# Patient Record
Sex: Male | Born: 1965
Health system: Southern US, Community
[De-identification: ages and names within clinical notes are randomized; demographics above are authoritative.]

## PROBLEM LIST (undated history)

## (undated) DIAGNOSIS — C801 Malignant (primary) neoplasm, unspecified: Secondary | ICD-10-CM

## (undated) DIAGNOSIS — D126 Benign neoplasm of colon, unspecified: Secondary | ICD-10-CM

## (undated) DIAGNOSIS — D649 Anemia, unspecified: Secondary | ICD-10-CM

## (undated) DIAGNOSIS — R7989 Other specified abnormal findings of blood chemistry: Secondary | ICD-10-CM

## (undated) DIAGNOSIS — K824 Cholesterolosis of gallbladder: Secondary | ICD-10-CM

## (undated) DIAGNOSIS — J189 Pneumonia, unspecified organism: Secondary | ICD-10-CM

## (undated) DIAGNOSIS — C9000 Multiple myeloma not having achieved remission: Secondary | ICD-10-CM

## (undated) HISTORY — PX: OTHER SURGICAL HISTORY: SHX169

## (undated) HISTORY — DX: Anemia, unspecified: D64.9

## (undated) HISTORY — DX: Cholesterolosis of gallbladder: K82.4

## (undated) HISTORY — DX: Other specified abnormal findings of blood chemistry: R79.89

## (undated) HISTORY — DX: Benign neoplasm of colon, unspecified: D12.6

## (undated) HISTORY — DX: Pneumonia, unspecified organism: J18.9

---

## 2009-03-02 ENCOUNTER — Ambulatory Visit: Payer: Self-pay | Admitting: Diagnostic Radiology

## 2009-03-02 ENCOUNTER — Emergency Department (HOSPITAL_BASED_OUTPATIENT_CLINIC_OR_DEPARTMENT_OTHER): Admission: EM | Admit: 2009-03-02 | Discharge: 2009-03-02 | Payer: Self-pay | Admitting: Emergency Medicine

## 2012-02-16 ENCOUNTER — Encounter (HOSPITAL_BASED_OUTPATIENT_CLINIC_OR_DEPARTMENT_OTHER): Payer: Self-pay | Admitting: Emergency Medicine

## 2012-02-16 ENCOUNTER — Emergency Department (HOSPITAL_BASED_OUTPATIENT_CLINIC_OR_DEPARTMENT_OTHER)
Admission: EM | Admit: 2012-02-16 | Discharge: 2012-02-16 | Disposition: A | Discharge status: Home / Self Care | Payer: BC Managed Care – PPO | Attending: Emergency Medicine | Admitting: Emergency Medicine

## 2012-02-16 ENCOUNTER — Emergency Department (HOSPITAL_BASED_OUTPATIENT_CLINIC_OR_DEPARTMENT_OTHER): Payer: BC Managed Care – PPO

## 2012-02-16 DIAGNOSIS — Y9301 Activity, walking, marching and hiking: Secondary | ICD-10-CM | POA: Insufficient documentation

## 2012-02-16 DIAGNOSIS — X500XXA Overexertion from strenuous movement or load, initial encounter: Secondary | ICD-10-CM | POA: Insufficient documentation

## 2012-02-16 DIAGNOSIS — S93401A Sprain of unspecified ligament of right ankle, initial encounter: Secondary | ICD-10-CM

## 2012-02-16 DIAGNOSIS — S93409A Sprain of unspecified ligament of unspecified ankle, initial encounter: Secondary | ICD-10-CM | POA: Insufficient documentation

## 2012-02-16 DIAGNOSIS — Y9289 Other specified places as the place of occurrence of the external cause: Secondary | ICD-10-CM | POA: Insufficient documentation

## 2012-02-16 NOTE — ED Notes (Signed)
MD at bedside. 

## 2012-02-16 NOTE — ED Notes (Addendum)
Pt c/o Rt ankle pain. Pt states he rolled it when loading the car last night. Pt ambulatory w/ crutches.

## 2012-02-16 NOTE — ED Notes (Signed)
Chart reviewed.

## 2012-02-16 NOTE — ED Provider Notes (Signed)
History     CSN: [id number]  Arrival date & time 02/16/12  9528   First MD Initiated Contact with Patient 02/16/12 [phone]      Chief Complaint  Patient presents with  . Ankle Pain    Rt    (Consider location/radiation/quality/duration/timing/severity/associated sxs/prior treatment) Patient is a 46 y.o. male presenting with ankle pain. The history is provided by the patient.  Ankle Pain  The incident occurred yesterday. Incident location: walking through the parking lot. Injury mechanism: inversion injury. The pain is present in the right ankle. The quality of the pain is described as sharp. The pain is moderate. The pain has been constant since onset. Pertinent negatives include no numbness and no loss of motion. He reports no foreign bodies present. The symptoms are aggravated by bearing weight.    History reviewed. No pertinent past medical history.  History reviewed. No pertinent past surgical history.  No family history on file.  History  Substance Use Topics  . Smoking status: Never Smoker   . Smokeless tobacco: Not on file  . Alcohol Use: Yes      Review of Systems  Neurological: Negative for numbness.  All other systems reviewed and are negative.    Allergies  Review of patient's allergies indicates no known allergies.  Home Medications  No current outpatient prescriptions on file.  BP 154/101  Pulse 60  Temp 98.2 F (36.8 C) (Oral)  Resp 16  Ht 6\' 1"  (1.854 m)  Wt 210 lb (95.255 kg)  BMI 27.71 kg/m2  SpO2 99%  Physical Exam  Nursing note and vitals reviewed. Constitutional: He is oriented to person, place, and time. He appears well-developed and well-nourished. No distress.  HENT:  Head: Normocephalic and atraumatic.  Mouth/Throat: Oropharynx is clear and moist.  Neck: Normal range of motion. Neck supple.  Musculoskeletal:       The right ankle is noted to have swelling, ttp over and inferior to the lateral malleolus.  There is no prox fib, med  malleolar, or 5th mt ttp.    Neurological: He is alert and oriented to person, place, and time.  Skin: Skin is warm and dry. He is not diaphoretic.    ED Course  Procedures (including critical care time)  Labs Reviewed - No data to display No results found.   No diagnosis found.    MDM  The xrays are negative except for a possibl avulsion fracture that I suspect is old.  Will treat as a sprain with rice, follow up prn.          Geoffery Lyons, MD 02/16/12 1007

## 2014-07-03 ENCOUNTER — Encounter: Payer: Self-pay | Admitting: Hematology & Oncology

## 2014-07-03 ENCOUNTER — Telehealth: Payer: Self-pay | Admitting: Hematology & Oncology

## 2014-07-03 NOTE — Telephone Encounter (Signed)
Called and spoke with patient's wife Manuela Schwartz).  Notified her that the first thing we had was not until March 31st.  Informed her of our satellite office with Dr. [name] in [facility]. Patient's wife stated they are willing to travel to [facility].    Called Rick at [facility] and he will call me back with an appt ASAP.    Dx:  R/O acute leukemia Referring:  Dr. Maceo Pro

## 2014-07-03 NOTE — Telephone Encounter (Signed)
Rick called from Dr. Antonieta Pert office.  Can see patient on Friday, March 18th @ 12pm.    Called pt's wife Manuela Schwartz) and notified her of appt, address to Dr. Antonieta Pert office, and gave her phone number in case they needed further directions.

## 2014-07-04 ENCOUNTER — Telehealth: Payer: Self-pay | Admitting: Hematology & Oncology

## 2014-07-04 NOTE — Telephone Encounter (Signed)
I tried to call  NEW PATIENT today to remind them of their appointment with Dr. [name]. Also, advised them to bring all medication bottles and insurance card information. However, the ph listed is disc.

## 2014-07-05 ENCOUNTER — Ambulatory Visit (HOSPITAL_BASED_OUTPATIENT_CLINIC_OR_DEPARTMENT_OTHER): Payer: BLUE CROSS/BLUE SHIELD | Admitting: Hematology & Oncology

## 2014-07-05 ENCOUNTER — Ambulatory Visit: Payer: BLUE CROSS/BLUE SHIELD

## 2014-07-05 ENCOUNTER — Encounter: Payer: Self-pay | Admitting: Hematology & Oncology

## 2014-07-05 ENCOUNTER — Ambulatory Visit (HOSPITAL_BASED_OUTPATIENT_CLINIC_OR_DEPARTMENT_OTHER): Payer: BLUE CROSS/BLUE SHIELD | Admitting: Lab

## 2014-07-05 ENCOUNTER — Ambulatory Visit (HOSPITAL_BASED_OUTPATIENT_CLINIC_OR_DEPARTMENT_OTHER)
Admission: RE | Admit: 2014-07-05 | Discharge: 2014-07-05 | Disposition: A | Discharge status: Home / Self Care | Payer: BLUE CROSS/BLUE SHIELD | Source: Ambulatory Visit | Attending: Hematology & Oncology | Admitting: Hematology & Oncology

## 2014-07-05 VITALS — BP 129/81 | HR 69 | Temp 98.6°F | Resp 18 | Ht 73.0 in | Wt 200.0 lb

## 2014-07-05 DIAGNOSIS — C9000 Multiple myeloma not having achieved remission: Secondary | ICD-10-CM

## 2014-07-05 LAB — CBC WITH DIFFERENTIAL (CANCER CENTER ONLY)
BASO#: 0 10*3/uL (ref 0.0–0.2)
BASO%: 0.3 % (ref 0.0–2.0)
EOS%: 1 % (ref 0.0–7.0)
Eosinophils Absolute: 0 10*3/uL (ref 0.0–0.5)
HCT: 23.9 % — ABNORMAL LOW (ref 38.7–49.9)
HGB: 8.1 g/dL — ABNORMAL LOW (ref 13.0–17.1)
LYMPH#: 1.4 10*3/uL (ref 0.9–3.3)
LYMPH%: 46.4 % (ref 14.0–48.0)
MCH: 33.8 pg — ABNORMAL HIGH (ref 28.0–33.4)
MCHC: 33.9 g/dL (ref 32.0–35.9)
MCV: 100 fL — ABNORMAL HIGH (ref 82–98)
MONO#: 0.4 10*3/uL (ref 0.1–0.9)
MONO%: 12.2 % (ref 0.0–13.0)
NEUT#: 1.2 10*3/uL — ABNORMAL LOW (ref 1.5–6.5)
NEUT%: 40.1 % (ref 40.0–80.0)
Platelets: 77 10*3/uL — ABNORMAL LOW (ref 145–400)
RBC: 2.4 10*6/uL — ABNORMAL LOW (ref 4.20–5.70)
RDW: 16.8 % — ABNORMAL HIGH (ref 11.1–15.7)
WBC: 3 10*3/uL — ABNORMAL LOW (ref 4.0–10.0)

## 2014-07-05 LAB — COMPREHENSIVE METABOLIC PANEL
ALT: 41 U/L (ref 0–53)
AST: 27 U/L (ref 0–37)
Albumin: 3.4 g/dL — ABNORMAL LOW (ref 3.5–5.2)
Alkaline Phosphatase: 39 U/L (ref 39–117)
BUN: 20 mg/dL (ref 6–23)
CO2: 23 mEq/L (ref 19–32)
Calcium: 9.5 mg/dL (ref 8.4–10.5)
Chloride: 101 mEq/L (ref 96–112)
Creatinine, Ser: 1.57 mg/dL — ABNORMAL HIGH (ref 0.50–1.35)
Glucose, Bld: 86 mg/dL (ref 70–99)
Potassium: 4.2 mEq/L (ref 3.5–5.3)
Sodium: 130 mEq/L — ABNORMAL LOW (ref 135–145)
Total Bilirubin: 0.6 mg/dL (ref 0.2–1.2)
Total Protein: 12.1 g/dL — ABNORMAL HIGH (ref 6.0–8.3)

## 2014-07-05 LAB — TECHNOLOGIST REVIEW CHCC SATELLITE

## 2014-07-05 LAB — CHCC SATELLITE - SMEAR

## 2014-07-05 NOTE — Progress Notes (Signed)
Referral MD  Reason for Referral: IgG Kappa myeloma   Chief Complaint  Patient presents with  . NEW PATIENT  : I came back from [place] because of my blood counts.  HPI: [name] is a really nice 49 year old white gentleman. He and his wife were living in [place]. He works for [employer]. He was planned to be there for a couple years.  He is incredibly fit. He does a lot of exercising. He does very physical exercising.  He found that he was just getting weaker. He just did not have a lot of energy.  He was not hurting. He had some achiness.  He had no fever. He had no cough. He had no leg swelling. There were no rashes.  He also only went to see a doctor in [place]. The doctor, he was incredibly anemic. I'm not sure what his hemoglobin was but I think that from the labs that were sent over from [place], his hemoglobin was 5.  He has had 3 or 4 units of blood.  He was found to have a protein spike. Again, I think that with the conversion that we have, I think his monoclonal spike was 4.9 g/dL.  Because of the socialized medicine in [place], he would not be able to get in to see a doctor for a while. He came back to the [place]. He actually lives close by. We were able to get him in today.  He really looks good. He does get tired easily.  Overall, his performance status is ECOG 0.  He does not smoke. He really does not drink much. His appetite has been okay. Not a vegetarian.  There is no type of blood problems in the family.  Of note, he had Lyme disease probably 17 years ago. He was treated with antibiotics for this.   No past medical history on file.:  No past surgical history on file.:  No current outpatient prescriptions on file.:  :  No Known Allergies:  No family history on file.:  History   Social History  . Marital Status: Married    Spouse Name: N/A  . Number of Children: N/A  . Years of Education: N/A   Occupational History  . Not on file.    Social History Main Topics  . Smoking status: Never Smoker   . Smokeless tobacco: Never Used     Comment: NEVER USED TOBACCO  . Alcohol Use: 0.0 oz/week    0 Standard drinks or equivalent per week  . Drug Use: No  . Sexual Activity: Not on file   Other Topics Concern  . Not on file   Social History Narrative  :  Pertinent items are noted in HPI.  Exam: '@IPVITALS' @  well-developed and well-nourished white gentleman in no obvious distress. His vital signs show a temperature of 98.6. Pulse 69. Blood pressure 129/81. Weight is 200 pounds. Head and neck exam shows normocephalic atraumatic skull. There are no ocular or oral lesions. He has no palpable cervical or supraclavicular lymph nodes. Lungs are clear. There are no rales, wheezes or rhonchi. Cardiac exam regular rate and rhythm with no murmurs, rubs or bruits. Abdomen is soft. He has good bowel sounds. There is no fluid wave. There is no palpable abdominal mass. He has no palpable hepatomegaly. Spleen tip might be palpable with deep inspiration. Back exam shows no tenderness over the spine, ribs or hips. Extremities shows no clubbing, cyanosis or edema. Neurological exam shows no focal neurological deficits.  Recent Labs  07/05/14 1211  WBC 3.0*  HGB 8.1*  HCT 23.9*  PLT 77*   No results for input(s): NA, K, CL, CO2, GLUCOSE, BUN, CREATININE, CALCIUM in the last 72 hours.  Blood smear review: Normochromic and normocytic population of red blood cells. He has marked rouleau formation. There is no nucleated red cells. He has no teardrop cells. White cells show a rare plasma cell. He has good maturation of his myeloid cells. I see no atypical lymphocytes. Platelets are decreased in number. Plantars are small. Platelets are well granulated.  Pathology: None     Assessment and Plan: [name] is a nice 49 year old gentleman. I have to believe that he has myeloma. I must say that it is very unusual to see such marked rouleau  formation on her blood smear. He clearly had this. In addition, there were some plasma cells that I saw.  He did have a bone survey done today. Surprisingly enough, this did not show any bony involvement.  1 possibility that we might have to think about with him is plasma cell leukemia. It is unusual for myeloma to cause pancytopenia. However, plasma cell leukemia would be more likely.  Ultimately, he will need a bone marrow biopsy. I will set this up for March 22. We will send off flow cytometry and cytogenetics  I spent about an hour with he and his wife. I planed to him what I thought was going on. He understands this.  He will need to do a 24-hour urine for [place]. I also will set him up with a PET scan.  Once we have all the results back from our bone marrow biopsy, cytogenetics and blood/urine studies, we will get him back in and plan for therapy.  I suspect that he will need ultimately a stem cell transplant I talked to him and his wife about this.

## 2014-07-08 ENCOUNTER — Other Ambulatory Visit: Payer: BLUE CROSS/BLUE SHIELD | Admitting: Lab

## 2014-07-08 ENCOUNTER — Other Ambulatory Visit: Payer: Self-pay | Admitting: Hematology & Oncology

## 2014-07-08 DIAGNOSIS — C9000 Multiple myeloma not having achieved remission: Secondary | ICD-10-CM

## 2014-07-09 ENCOUNTER — Telehealth: Payer: Self-pay | Admitting: Hematology & Oncology

## 2014-07-09 ENCOUNTER — Ambulatory Visit (HOSPITAL_COMMUNITY)
Admission: RE | Admit: 2014-07-09 | Discharge: 2014-07-09 | Disposition: A | Discharge status: Home / Self Care | Payer: BLUE CROSS/BLUE SHIELD | Source: Ambulatory Visit | Attending: Hematology & Oncology | Admitting: Hematology & Oncology

## 2014-07-09 ENCOUNTER — Encounter (HOSPITAL_COMMUNITY): Payer: Self-pay

## 2014-07-09 VITALS — BP 119/69 | HR 70 | Temp 97.6°F | Resp 19 | Ht 73.0 in | Wt 200.0 lb

## 2014-07-09 DIAGNOSIS — C9000 Multiple myeloma not having achieved remission: Secondary | ICD-10-CM | POA: Diagnosis not present

## 2014-07-09 DIAGNOSIS — D61818 Other pancytopenia: Secondary | ICD-10-CM | POA: Insufficient documentation

## 2014-07-09 LAB — CBC WITH DIFFERENTIAL/PLATELET
Basophils Absolute: 0 10*3/uL (ref 0.0–0.1)
Basophils Relative: 1 % (ref 0–1)
Eosinophils Absolute: 0 10*3/uL (ref 0.0–0.7)
Eosinophils Relative: 1 % (ref 0–5)
HCT: 22.5 % — ABNORMAL LOW (ref 39.0–52.0)
Hemoglobin: 7.5 g/dL — ABNORMAL LOW (ref 13.0–17.0)
Lymphocytes Relative: 45 % (ref 12–46)
Lymphs Abs: 1.5 10*3/uL (ref 0.7–4.0)
MCH: 33.5 pg (ref 26.0–34.0)
MCHC: 33.3 g/dL (ref 30.0–36.0)
MCV: 100.4 fL — ABNORMAL HIGH (ref 78.0–100.0)
Monocytes Absolute: 0.3 10*3/uL (ref 0.1–1.0)
Monocytes Relative: 9 % (ref 3–12)
Neutro Abs: 1.5 10*3/uL — ABNORMAL LOW (ref 1.7–7.7)
Neutrophils Relative %: 45 % (ref 43–77)
Platelets: 74 10*3/uL — ABNORMAL LOW (ref 150–400)
RBC: 2.24 MIL/uL — ABNORMAL LOW (ref 4.22–5.81)
RDW: 17 % — ABNORMAL HIGH (ref 11.5–15.5)
WBC: 3.3 10*3/uL — ABNORMAL LOW (ref 4.0–10.5)

## 2014-07-09 LAB — BONE MARROW EXAM

## 2014-07-09 MED ORDER — SODIUM CHLORIDE 0.9 % IV SOLN
INTRAVENOUS | Status: DC
  Administered 2014-07-09: 200 mL via INTRAVENOUS

## 2014-07-09 MED ORDER — MEPERIDINE HCL 50 MG/ML IJ SOLN
50.0000 mg | Freq: Once | INTRAMUSCULAR | Status: AC
  Administered 2014-07-09: 50 mg via INTRAVENOUS
  Filled 2014-07-09: qty 1

## 2014-07-09 MED ORDER — MIDAZOLAM HCL 10 MG/2ML IJ SOLN
10.0000 mg | Freq: Once | INTRAMUSCULAR | Status: AC
  Administered 2014-07-09: 5 mg via INTRAVENOUS
  Filled 2014-07-09: qty 2

## 2014-07-09 NOTE — Sedation Documentation (Signed)
Family updated as to patient's status.

## 2014-07-09 NOTE — Sedation Documentation (Signed)
MD at bedside. 

## 2014-07-09 NOTE — Procedures (Signed)
This is a bone marrow biopsy aspirate no 4 [name].  He is brought to the short stay unit at [facility]. He had IV placed peripherally.  We did the appropriate timeout procedure at 8:00 in the morning.  His Mallimpati score was 1. His ASA class was 1.  We then placed onto his right side. He received a total of 5 mg of Versed and 50 mg of Demerol for IV sedation.  The left posterior iliac crest region was prepped and draped in sterile fashion. 5 mL of 2% lidocaine was admitted under the skin down to the periosteum. A #11 scalpel was used to make an incision into the skin.  Despite 3 attempts, we cannot get an aspirate.  I then used the biopsy needle. I obtained to biopsy cores. One core was sent off for flow cytometry and sound genetics.  He tolerated the procedure well. There were no complications. I dressed the site sterilely.  I talked to his wife afterwards. I told him that I would call them with the results on Thursday.  Lum Keas

## 2014-07-09 NOTE — Discharge Instructions (Signed)
Bone Marrow Aspiration and Bone Biopsy Examination of the bone marrow is a valuable test to diagnose blood disorders. A bone marrow biopsy takes a sample of bone and a small amount of fluid and cells from inside the bone. A bone marrow aspiration removes only the marrow. Bone marrow aspiration and bone biopsies are used to stage different disorders of the blood, such as leukemia. Staging will help your caregiver understand how far the disease has progressed.  The tests are also useful in diagnosing:  Fever of unknown origin (FUO).  Bacterial infections and other widespread fungal infections.  Cancers that have spread (metastasized) to the bone marrow.  Diseases that are characterized by a deficiency of an enzyme (storage diseases). This includes:  Niemann-Pick disease.  Gaucher disease. PROCEDURE  Sites used to get samples include:   Back of your hip bone (posterior iliac crest).  Both aspiration and biopsy.  Front of your hip bone (anterior iliac crest).  Both aspiration and biopsy.  Breastbone (sternum).  Aspiration from your breastbone (done only in adults). This method is rarely used. When you get a hip bone aspiration:  You are placed lying on your side with the upper knee brought up and flexed with the lower leg straight.  The site is prepared, cleaned with an antiseptic scrub, and draped. This keeps the biopsy area clean.  The skin and the area down to the lining of the bone (periosteum) are made numb with a local anesthetic.  The bone marrow aspiration needle is inserted. You will feel pressure on your bone.  Once inside the marrow cavity, a sample of bone marrow is sucked out (aspirated) for pathology slides.  The material collected for bone marrow slides is processed immediately by a technologist.  The technician selects the marrow particles to make the slides for pathology.  The marrow aspiration needle is removed. Then pressure is applied to the site with  gauze until bleeding has stopped. Following an aspiration, a bone marrow biopsy may be performed as well. The technique for this is very similar. A dressing is then applied.  RISKS AND COMPLICATIONS  The main complications of a bone marrow aspiration and biopsy include infection and bleeding.  Complications are uncommon. The procedure may not be performed in patients with bleeding tendencies.  A very rare complication from the procedure is injury to the heart during a breastbone (sternal) marrow aspiration. Only bone marrow aspirations are performed in this area.  Long-lasting pain at the site of the bone marrow aspiration and biopsy is uncommon. Your caregiver will let you know when you are to get your results and will discuss them with you. You may make an appointment with your caregiver to find out the results. Do not assume everything is normal if you have not heard from your caregiver or the medical facility. It is important for you to follow up on all of your test results. Document Released: 04/08/2004 Document Revised: 06/28/2011 Document Reviewed: 04/02/2008 [facility] Patient Information 2015 [place], [place]. This information is not intended to replace advice given to you by your health care provider. Make sure you discuss any questions you have with your health care provider. Do not drive  For 24 hours Do not go into public places today May resume your regular diet and take home medications as usual May experience small amount of tingling in leg (biopsy side) May take shower and remove bandage in am For any questions or concerns, call dr If bleeding occurs at site, hold pressure x10  minutes  If continues, call doctor

## 2014-07-09 NOTE — Sedation Documentation (Signed)
Patient denies pain and is resting comfortably.  

## 2014-07-09 NOTE — Sedation Documentation (Signed)
dsg cdi 

## 2014-07-09 NOTE — Telephone Encounter (Signed)
PET is precerted called back to schedule for this week Jan said I needed to call their director Elta Guadeloupe [phone] to get PET this week. I talked with Elta Guadeloupe and he took down all the information and will call me back.

## 2014-07-09 NOTE — Sedation Documentation (Signed)
Patient is resting comfortably. 

## 2014-07-09 NOTE — Sedation Documentation (Signed)
Patient identified as Tyler Phillips by  Dr Martha Clan Santiago Glad Vin Yonke

## 2014-07-09 NOTE — Sedation Documentation (Signed)
Medication dose calculated and verified for: Latavious Hammad demerol 50mg  / versed 5mg 

## 2014-07-09 NOTE — Telephone Encounter (Signed)
Tyler Phillips called back scheduled 3-23 PET at 11:30 pt is aware and to be NPO 6 hrs.

## 2014-07-09 NOTE — Sedation Documentation (Signed)
dsg applied per md/ CDI

## 2014-07-10 ENCOUNTER — Ambulatory Visit (HOSPITAL_COMMUNITY)
Admission: RE | Admit: 2014-07-10 | Discharge: 2014-07-10 | Disposition: A | Discharge status: Home / Self Care | Payer: BLUE CROSS/BLUE SHIELD | Source: Ambulatory Visit | Attending: Hematology & Oncology | Admitting: Hematology & Oncology

## 2014-07-10 ENCOUNTER — Other Ambulatory Visit: Payer: Self-pay | Admitting: Hematology & Oncology

## 2014-07-10 DIAGNOSIS — C9000 Multiple myeloma not having achieved remission: Secondary | ICD-10-CM

## 2014-07-10 LAB — IGG, IGA, IGM
IgA: 26 mg/dL — ABNORMAL LOW (ref 68–379)
IgG (Immunoglobin G), Serum: 6860 mg/dL — ABNORMAL HIGH (ref 650–1600)
IgM, Serum: 23 mg/dL — ABNORMAL LOW (ref 41–251)

## 2014-07-10 LAB — PROTEIN ELECTROPHORESIS, SERUM, WITH REFLEX
Albumin ELP: 4.8 g/dL (ref 3.8–4.8)
Alpha-1-Globulin: 0.3 g/dL (ref 0.2–0.3)
Alpha-2-Globulin: 0.7 g/dL (ref 0.5–0.9)
Beta 2: 0.2 g/dL (ref 0.2–0.5)
Beta Globulin: 0.4 g/dL (ref 0.4–0.6)
Gamma Globulin: 5.8 g/dL — ABNORMAL HIGH (ref 0.8–1.7)
M-Spike, %: 5.3 g/dL
Total Protein, Serum Electrophoresis: 12.2 g/dL — ABNORMAL HIGH (ref 6.1–8.1)

## 2014-07-10 LAB — KAPPA/LAMBDA LIGHT CHAINS
Kappa free light chain: 79.1 mg/dL — ABNORMAL HIGH (ref 0.33–1.94)
Kappa:Lambda Ratio: 494.38 — ABNORMAL HIGH (ref 0.26–1.65)
Lambda Free Lght Chn: 0.16 mg/dL — ABNORMAL LOW (ref 0.57–2.63)

## 2014-07-10 LAB — LACTATE DEHYDROGENASE: LDH: 132 U/L (ref 94–250)

## 2014-07-10 LAB — BETA 2 MICROGLOBULIN, SERUM: Beta-2 Microglobulin: 18.9 mg/L — ABNORMAL HIGH (ref <=2.51)

## 2014-07-10 LAB — IFE INTERPRETATION

## 2014-07-10 LAB — GLUCOSE, CAPILLARY: Glucose-Capillary: 94 mg/dL (ref 70–99)

## 2014-07-10 MED ORDER — FLUDEOXYGLUCOSE F - 18 (FDG) INJECTION
10.0600 | Freq: Once | INTRAVENOUS | Status: AC | PRN
  Administered 2014-07-10: 10.06 via INTRAVENOUS

## 2014-07-11 NOTE — Addendum Note (Signed)
Addended by: Burney Gauze R on: 07/11/2014 05:10 PM   Modules accepted: Orders

## 2014-07-12 ENCOUNTER — Encounter: Payer: Self-pay | Admitting: *Deleted

## 2014-07-12 ENCOUNTER — Telehealth: Payer: Self-pay | Admitting: Hematology & Oncology

## 2014-07-12 LAB — UIFE/LIGHT CHAINS/TP QN, 24-HR UR
Albumin, U: DETECTED
Alpha 1, Urine: DETECTED — AB
Alpha 2, Urine: DETECTED — AB
Beta, Urine: DETECTED — AB
Gamma Globulin, Urine: DETECTED — AB
Time: 24 hours
Total Protein, Urine-Ur/day: 198 mg/d — ABNORMAL HIGH (ref <150)
Total Protein, Urine: 6 mg/dL (ref 5–25)
Volume, Urine: 3300 mL

## 2014-07-12 LAB — 24 HR URINE,KAPPA/LAMBDA LIGHT CHAINS
24H Urine Volume: 3300 mL/24 h
Measured Kappa Chain: 1.58 mg/dL (ref <2.00)
Measured Lambda Chain: <0.4 mg/dL (ref <2.00)
Total Kappa Chain: 52.14 mg/24 h

## 2014-07-12 NOTE — Telephone Encounter (Signed)
Left pt message with 3-28 appointment

## 2014-07-15 ENCOUNTER — Other Ambulatory Visit: Payer: BLUE CROSS/BLUE SHIELD

## 2014-07-15 ENCOUNTER — Telehealth: Payer: Self-pay | Admitting: Hematology & Oncology

## 2014-07-15 ENCOUNTER — Ambulatory Visit (HOSPITAL_COMMUNITY)
Admission: RE | Admit: 2014-07-15 | Discharge: 2014-07-15 | Disposition: A | Discharge status: Home / Self Care | Payer: BLUE CROSS/BLUE SHIELD | Source: Ambulatory Visit | Attending: Hematology & Oncology | Admitting: Hematology & Oncology

## 2014-07-15 ENCOUNTER — Encounter: Payer: Self-pay | Admitting: Hematology & Oncology

## 2014-07-15 ENCOUNTER — Ambulatory Visit (HOSPITAL_BASED_OUTPATIENT_CLINIC_OR_DEPARTMENT_OTHER): Payer: BLUE CROSS/BLUE SHIELD | Admitting: Hematology & Oncology

## 2014-07-15 ENCOUNTER — Ambulatory Visit: Payer: BLUE CROSS/BLUE SHIELD | Admitting: Hematology & Oncology

## 2014-07-15 VITALS — BP 142/90 | HR 85 | Temp 98.3°F | Resp 18 | Ht 72.0 in | Wt 198.0 lb

## 2014-07-15 DIAGNOSIS — C9000 Multiple myeloma not having achieved remission: Secondary | ICD-10-CM

## 2014-07-15 DIAGNOSIS — C9001 Multiple myeloma in remission: Secondary | ICD-10-CM | POA: Insufficient documentation

## 2014-07-15 LAB — CBC WITH DIFFERENTIAL (CANCER CENTER ONLY)
BASO#: 0 10*3/uL (ref 0.0–0.2)
BASO%: 0.3 % (ref 0.0–2.0)
EOS%: 2.1 % (ref 0.0–7.0)
Eosinophils Absolute: 0.1 10*3/uL (ref 0.0–0.5)
HCT: 20.9 % — ABNORMAL LOW (ref 38.7–49.9)
HGB: 6.9 g/dL — CL (ref 13.0–17.1)
LYMPH#: 1.5 10*3/uL (ref 0.9–3.3)
LYMPH%: 44.4 % (ref 14.0–48.0)
MCH: 34 pg — ABNORMAL HIGH (ref 28.0–33.4)
MCHC: 33 g/dL (ref 32.0–35.9)
MCV: 103 fL — ABNORMAL HIGH (ref 82–98)
MONO#: 0.4 10*3/uL (ref 0.1–0.9)
MONO%: 10.8 % (ref 0.0–13.0)
NEUT#: 1.4 10*3/uL — ABNORMAL LOW (ref 1.5–6.5)
NEUT%: 42.4 % (ref 40.0–80.0)
Platelets: 78 10*3/uL — ABNORMAL LOW (ref 145–400)
RBC: 2.03 10*6/uL — ABNORMAL LOW (ref 4.20–5.70)
RDW: 16.3 % — ABNORMAL HIGH (ref 11.1–15.7)
WBC: 3.3 10*3/uL — ABNORMAL LOW (ref 4.0–10.0)

## 2014-07-15 LAB — CHROMOSOME ANALYSIS, BONE MARROW

## 2014-07-15 LAB — HOLD TUBE, BLOOD BANK - CHCC SATELLITE

## 2014-07-15 LAB — TISSUE HYBRIDIZATION (BONE MARROW)-NCBH

## 2014-07-15 LAB — TECHNOLOGIST REVIEW CHCC SATELLITE

## 2014-07-15 LAB — ABO/RH: ABO/RH(D): O POS

## 2014-07-15 MED ORDER — DEXAMETHASONE 4 MG PO TABS: ORAL_TABLET | ORAL | Status: DC

## 2014-07-15 MED ORDER — LENALIDOMIDE 25 MG PO CAPS: 25.0000 mg | ORAL_CAPSULE | Freq: Every day | ORAL | Status: DC

## 2014-07-15 MED ORDER — FAMCICLOVIR 500 MG PO TABS: 500.0000 mg | ORAL_TABLET | Freq: Every day | ORAL | Status: DC

## 2014-07-15 NOTE — Patient Instructions (Signed)
Bortezomib injection What is this medicine? BORTEZOMIB (bor TEZ oh mib) is a chemotherapy drug. It slows the growth of cancer cells. This medicine is used to treat multiple myeloma, and certain lymphomas, such as mantle-cell lymphoma. This medicine may be used for other purposes; ask your health care provider or pharmacist if you have questions. COMMON BRAND NAME(S): Velcade What should I tell my health care provider before I take this medicine? They need to know if you have any of these conditions: -diabetes -heart disease -irregular heartbeat -liver disease -on hemodialysis -low blood counts, like low white blood cells, platelets, or hemoglobin -peripheral neuropathy -taking medicine for blood pressure -an unusual or allergic reaction to bortezomib, mannitol, boron, other medicines, foods, dyes, or preservatives -pregnant or trying to get pregnant -breast-feeding How should I use this medicine? This medicine is for injection into a vein or for injection under the skin. It is given by a health care professional in a hospital or clinic setting. Talk to your pediatrician regarding the use of this medicine in children. Special care may be needed. Overdosage: If you think you have taken too much of this medicine contact a poison control center or emergency room at once. NOTE: This medicine is only for you. Do not share this medicine with others. What if I miss a dose? It is important not to miss your dose. Call your doctor or health care professional if you are unable to keep an appointment. What may interact with this medicine? This medicine may interact with the following medications: -ketoconazole -rifampin -ritonavir -St. John's Wort This list may not describe all possible interactions. Give your health care provider a list of all the medicines, herbs, non-prescription drugs, or dietary supplements you use. Also tell them if you smoke, drink alcohol, or use illegal drugs. Some items  may interact with your medicine. What should I watch for while using this medicine? Visit your doctor for checks on your progress. This drug may make you feel generally unwell. This is not uncommon, as chemotherapy can affect healthy cells as well as cancer cells. Report any side effects. Continue your course of treatment even though you feel ill unless your doctor tells you to stop. You may get drowsy or dizzy. Do not drive, use machinery, or do anything that needs mental alertness until you know how this medicine affects you. Do not stand or sit up quickly, especially if you are an older patient. This reduces the risk of dizzy or fainting spells. In some cases, you may be given additional medicines to help with side effects. Follow all directions for their use. Call your doctor or health care professional for advice if you get a fever, chills or sore throat, or other symptoms of a cold or flu. Do not treat yourself. This drug decreases your body's ability to fight infections. Try to avoid being around people who are sick. This medicine may increase your risk to bruise or bleed. Call your doctor or health care professional if you notice any unusual bleeding. You may need blood work done while you are taking this medicine. In some patients, this medicine may cause a serious brain infection that may cause death. If you have any problems seeing, thinking, speaking, walking, or standing, tell your doctor right away. If you cannot reach your doctor, urgently seek other source of medical care. Do not become pregnant while taking this medicine. Women should inform their doctor if they wish to become pregnant or think they might be pregnant. There is   you cannot reach your doctor, urgently seek other source of medical care.  Do not become pregnant while taking this medicine. Women should inform their doctor if they wish to become pregnant or think they might be pregnant. There is a potential for serious side effects to an unborn child. Talk to your health care professional or pharmacist for more information. Do not breast-feed an infant while taking this medicine.  Check with your doctor or health care professional if you get an attack of  severe diarrhea, nausea and vomiting, or if you sweat a lot. The loss of too much body fluid can make it dangerous for you to take this medicine.  What side effects may I notice from receiving this medicine?  Side effects that you should report to your doctor or health care professional as soon as possible:  -allergic reactions like skin rash, itching or hives, swelling of the face, lips, or tongue  -breathing problems  -changes in hearing  -changes in vision  -fast, irregular heartbeat  -feeling faint or lightheaded, falls  -pain, tingling, numbness in the hands or feet  -right upper belly pain  -seizures  -swelling of the ankles, feet, hands  -unusual bleeding or bruising  -unusually weak or tired  -vomiting  -yellowing of the eyes or skin  Side effects that usually do not require medical attention (report to your doctor or health care professional if they continue or are bothersome):  -changes in emotions or moods  -constipation  -diarrhea  -loss of appetite  -headache  -irritation at site where injected  -nausea  This list may not describe all possible side effects. Call your doctor for medical advice about side effects. You may report side effects to FDA at 1-800-FDA-1088.  Where should I keep my medicine?  This drug is given in a hospital or clinic and will not be stored at home.  NOTE: This sheet is a summary. It may not cover all possible information. If you have questions about this medicine, talk to your doctor, pharmacist, or health care provider.   2015, Elsevier/Gold Standard. (2013-01-29 12:46:32)  Lenalidomide Oral Capsules  What is this medicine?  LENALIDOMIDE (len a LID oh mide) is a chemotherapy drug that targets specific proteins within cancer cells and stops the cancer cell from growing. It is used to treat multiple myeloma, mantle cell lymphoma, and some myelodysplastic syndromes that cause severe anemia requiring blood transfusions.  This medicine may be used for other purposes; ask your  health care provider or pharmacist if you have questions.  COMMON BRAND NAME(S): Revlimid  What should I tell my health care provider before I take this medicine?  They need to know if you have any of these conditions:  -blood clots in the legs or the lungs  -high blood pressure  -high cholesterol  -infection  -irregular monthly periods or menstrual cycles  -kidney disease  -liver disease  -smoke tobacco  -thyroid disease  -an unusual or allergic reaction to lenalidomide, other medicines, foods, dyes, or preservatives  -pregnant or trying to get pregnant  -breast-feeding  How should I use this medicine?  Take this medicine by mouth with a glass of water. Follow the directions on the prescription label. Do not cut, crush, or chew this medicine. Take your medicine at regular intervals. Do not take it more often than directed. Do not stop taking except on your doctor's advice.  A MedGuide will be given with each prescription and refill. Read this guide carefully each time. The   MedGuide may change frequently.  Talk to your pediatrician regarding the use of this medicine in children. Special care may be needed.  Overdosage: If you think you have taken too much of this medicine contact a poison control center or emergency room at once.  NOTE: This medicine is only for you. Do not share this medicine with others.  What if I miss a dose?  If you miss a dose, take it as soon as you can. If your next dose is to be taken in less than 12 hours, then do not take the missed dose. Take the next dose at your regular time. Do not take double or extra doses.  What may interact with this medicine?  This medicine may interact with the following medications:  -digoxin  -medicines that increase the risk of thrombosis like estrogens or erythropoietic agents (e.g., epoetin alfa and darbepoetin alfa)  -warfarin  This list may not describe all possible interactions. Give your health care provider a list of all the medicines, herbs,  non-prescription drugs, or dietary supplements you use. Also tell them if you smoke, drink alcohol, or use illegal drugs. Some items may interact with your medicine.  What should I watch for while using this medicine?  Visit your doctor for regular check ups. Tell your doctor or healthcare professional if your symptoms do not start to get better or if they get worse. You will need to have important blood work done while you are taking this medicine.  This medicine is available only through a special program. Doctors, pharmacies, and patients must meet all of the conditions of the program. Your health care provider will help you get signed up with the program if you need this medicine. Through the program you will only receive up to a 28 day supply of the medicine at one time. You will need a new prescription for each refill.  This medicine can cause birth defects. Do not get pregnant while taking this drug. Females with child-bearing potential will need to have 2 negative pregnancy tests before starting this medicine. Pregnancy testing must be done every 2 to 4 weeks as directed while taking this medicine. Use 2 reliable forms of birth control together while you are taking this medicine and for 1 month after you stop taking this medicine. If you think that you might be pregnant talk to your doctor right away.  Men must use a latex condom during sexual contact with a woman while taking this medicine and for 28 days after you stop taking this medicine. A latex condom is needed even if you have had a vasectomy. Contact your doctor right away if your partner becomes pregnant. Do not donate sperm while taking this medicine and for 28 days after you stop taking this medicine.  Do not give blood while taking the medicine and for 1 month after completion of treatment to avoid exposing pregnant women to the medicine through the donated blood.  Talk to your doctor about your risk of cancer. You may be more at risk for certain  types of cancers if you take this medicine.  What side effects may I notice from receiving this medicine?  Side effects that you should report to your doctor or health care professional as soon as possible:  -allergic reactions like skin rash, itching or hives, swelling of the face, lips, or tongue  -breathing problems  -chest pain or tightness  -fast, irregular heartbeat  -low blood counts - this medicine may decrease the number   of white blood cells, red blood cells and platelets. You may be at increased risk for infections and bleeding.  -seizures  -signs and symptoms of bleeding such as bloody or black, tarry stools; red or dark-brown urine; spitting up blood or brown material that looks like coffee grounds; red spots on the skin; unusual bruising or bleeding from the eye, gums, or nose  -signs and symptoms of a blood clot such as breathing problems; changes in vision; chest pain; severe, sudden headache; pain, swelling, warmth in the leg; trouble speaking; sudden numbness or weakness of the face, arm or leg  -signs and symptoms of liver injury like dark yellow or brown urine; general ill feeling or flu-like symptoms; light-colored stools; loss of appetite; nausea; right upper belly pain; unusually weak or tired; yellowing of the eyes or skin  -signs and symptoms of a stroke like changes in vision; confusion; trouble speaking or understanding; severe headaches; sudden numbness or weakness of the face, arm or leg; trouble walking; dizziness; loss of balance or coordination  -sweating  -vomiting  Side effects that usually do not require medical attention (report to your doctor or health care professional if they continue or are bothersome):  -constipation  -cough  -diarrhea  -tiredness  This list may not describe all possible side effects. Call your doctor for medical advice about side effects. You may report side effects to FDA at 1-800-FDA-1088.  Where should I keep my medicine?  Keep out of the reach of  children.  Store at room temperature between 15 and 30 degrees C (59 and 86 degrees F). Throw away any unused medicine after the expiration date.  NOTE: This sheet is a summary. It may not cover all possible information. If you have questions about this medicine, talk to your doctor, pharmacist, or health care provider.   2015, Elsevier/Gold Standard. (2013-07-10 18:30:01)

## 2014-07-15 NOTE — Progress Notes (Signed)
Hematology and Oncology Follow Up Visit  Tyler Phillips [id number] 07-17-1965 49 y.o. 07/15/2014   Principle Diagnosis:   IgG Kappa myeloma  Current Therapy:    Patient to start therapy with RVD  Zometa 4 mg IV every month     Interim History:  [name] is back for follow-up. We have diagnosed him with IgG Kappa myeloma. We did do a bone marrow biopsy on him. This was done on March 22. The pathology report ([id number]) showed a very hypercellular marrow. He had about 80% myeloma cells. The cells had atypical features.  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  We did do a PET scan on him. PET scan did not show any uptake in his bones [place] that this issue with the bone marrow.  His 24-hour urine showed a 52 mg of Kappa light chain.  He is started feel a little bit more tired. We did his bone marrow test, he had a hemoglobin of 7.5. I'm sure that he just has very little normal marrow functioning because of the extensive myelomatous involvement.  I think that we can move ahead with treatment on him. I think that the RVD program would be effective. I don't see anything negative with his cytogenetics.  I talked to he and his wife for about 45 minutes. I outlined my recommendations for him and his treatment.  His appetite is good. He's had no nausea or vomiting. He's had no change in bowel or bladder habits.  He's had no fever.  Medications:  Current outpatient prescriptions:  .  dexamethasone (DECADRON) 4 MG tablet, Take 5 pills at one time once a week with food for 3 weeks then 1 week off, Disp: 60 tablet, Rfl: 2 .  famciclovir (FAMVIR) 500 MG tablet, Take 1 tablet (500 mg total) by mouth daily., Disp: 30 tablet, Rfl: 6 .  lenalidomide (REVLIMID) 25 MG capsule, Take 1 capsule (25 mg total) by mouth daily. Take for 21 days and 7 days off., Disp: 21 capsule, Rfl: 0  Allergies: No Known Allergies  Past Medical History, Surgical  history, Social history, and Family History were reviewed and updated.  Review of Systems: As above  Physical Exam:  height is 6' (1.829 m) and weight is 198 lb (89.812 kg). His oral temperature is 98.3 F (36.8 C). His blood pressure is 142/90 and his pulse is 85. His respiration is 18.   Wt Readings from Last 3 Encounters:  07/15/14 198 lb (89.812 kg)  07/05/14 200 lb (90.719 kg)  02/16/12 210 lb (95.255 kg)     Well-developed and well-nourished white chum in no obvious distress. Head and neck exam shows no ocular or oral lesions. There are no palpable cervical or supraclavicular lymph nodes. Lungs are clear. Cardiac exam regular rate and rhythm with no murmurs, rubs or bruits. Abdomen is soft. He has good bowel sounds. There is no fluid wave. There is no palpable hepatomegaly. His spleen tip might be palpable at the left costal margin. Extremities shows no clubbing, cyanosis or edema. He may have some trace edema in his lower legs. Skin exam shows no rashes, ecchymoses or petechia. Neurological exam is nonfocal.  Lab Results  Component Value Date   WBC 3.3* 07/15/2014   HGB 6.9* 07/15/2014   HCT 20.9* 07/15/2014   MCV 103* 07/15/2014   PLT 78* 07/15/2014     Chemistry      Component Value Date/Time   NA 130* 07/05/2014  1212   K 4.2 07/05/2014 1212   CL 101 07/05/2014 1212   CO2 23 07/05/2014 1212   BUN 20 07/05/2014 1212   CREATININE 1.57* 07/05/2014 1212      Component Value Date/Time   CALCIUM 9.5 07/05/2014 1212   ALKPHOS 39 07/05/2014 1212   AST 27 07/05/2014 1212   ALT 41 07/05/2014 1212   BILITOT 0.6 07/05/2014 1212         Impression and Plan: [name] is 49 year old gentleman with IgG Kappa myeloma. He has extensive marrow involvement. His beta-2 microglobulin is almost 19. This by the highest that seen for a patient with myeloma.  Again, I think that we should still be able to get a very good response with Velcade and Revlimid. I don't think that this  be a problem with his mild renal insufficiency. He does not have hypercalcemia. I encouraged him to drink a lot of liquid.  I went over the chemotherapy protocol with he and his wife. We gave them information sheets. I want to get started this week.  I gave him a prescription for Pham there. He'll take this 500 mg daily. I also told him to take aspirin at 325 mg a day. This is to help prevent thrombo- embolic disease with the Revlimid.  I answered all their questions. Provide told him that it typically takes about 4 months before we can get the response down to the point where we can consider a stem cell transplant. I still feel that a stem cell transplant would be appropriate for him.  We will go ahead and start treatment on him this week.  I will see him back formally in one month.  He is quite anemic today. We will have to go ahead and give him 2 units of blood. I want to try to hold off on transfusing him as much as possible.      [name], MD 3/28/20162:07 PM

## 2014-07-15 NOTE — Telephone Encounter (Signed)
[employer]  [id number] VELCADE  Injection, bortezomib

## 2014-07-16 ENCOUNTER — Encounter: Payer: Self-pay | Admitting: *Deleted

## 2014-07-16 ENCOUNTER — Other Ambulatory Visit: Payer: Self-pay | Admitting: Nurse Practitioner

## 2014-07-16 ENCOUNTER — Other Ambulatory Visit: Payer: BLUE CROSS/BLUE SHIELD

## 2014-07-16 DIAGNOSIS — C9000 Multiple myeloma not having achieved remission: Secondary | ICD-10-CM

## 2014-07-16 MED ORDER — LENALIDOMIDE 25 MG PO CAPS: 25.0000 mg | ORAL_CAPSULE | Freq: Every day | ORAL | Status: DC

## 2014-07-17 ENCOUNTER — Telehealth: Payer: Self-pay | Admitting: Hematology & Oncology

## 2014-07-17 ENCOUNTER — Ambulatory Visit (HOSPITAL_COMMUNITY): Payer: BLUE CROSS/BLUE SHIELD

## 2014-07-17 NOTE — Telephone Encounter (Signed)
Santiago Glad from [employer] [facility] called wants pt contact numbers, I gave message to RN. She called from [phone] but left call back number as [phone] option 2

## 2014-07-18 ENCOUNTER — Ambulatory Visit (HOSPITAL_BASED_OUTPATIENT_CLINIC_OR_DEPARTMENT_OTHER): Payer: BLUE CROSS/BLUE SHIELD

## 2014-07-18 ENCOUNTER — Encounter: Payer: Self-pay | Admitting: Hematology & Oncology

## 2014-07-18 VITALS — BP 120/63 | HR 69 | Temp 98.5°F | Resp 18

## 2014-07-18 DIAGNOSIS — C9000 Multiple myeloma not having achieved remission: Secondary | ICD-10-CM

## 2014-07-18 DIAGNOSIS — Z5112 Encounter for antineoplastic immunotherapy: Secondary | ICD-10-CM | POA: Diagnosis not present

## 2014-07-18 LAB — PREPARE RBC (CROSSMATCH)

## 2014-07-18 MED ORDER — ACETAMINOPHEN 325 MG PO TABS
650.0000 mg | ORAL_TABLET | Freq: Once | ORAL | Status: AC
  Administered 2014-07-18: 650 mg via ORAL

## 2014-07-18 MED ORDER — ONDANSETRON HCL 8 MG PO TABS: 8.0000 mg | ORAL_TABLET | Freq: Two times a day (BID) | ORAL | Status: DC

## 2014-07-18 MED ORDER — LORAZEPAM 0.5 MG PO TABS: 0.5000 mg | ORAL_TABLET | Freq: Four times a day (QID) | ORAL | Status: DC | PRN

## 2014-07-18 MED ORDER — ZOLEDRONIC ACID 4 MG/100ML IV SOLN
4.0000 mg | Freq: Once | INTRAVENOUS | Status: AC
  Administered 2014-07-18: 4 mg via INTRAVENOUS
  Filled 2014-07-18: qty 100

## 2014-07-18 MED ORDER — PROCHLORPERAZINE MALEATE 10 MG PO TABS: 10.0000 mg | ORAL_TABLET | Freq: Four times a day (QID) | ORAL | Status: DC | PRN

## 2014-07-18 MED ORDER — DIPHENHYDRAMINE HCL 25 MG PO CAPS
25.0000 mg | ORAL_CAPSULE | Freq: Once | ORAL | Status: AC
  Administered 2014-07-18: 25 mg via ORAL

## 2014-07-18 MED ORDER — FUROSEMIDE 10 MG/ML IJ SOLN
INTRAMUSCULAR | Status: AC
  Filled 2014-07-18: qty 4

## 2014-07-18 MED ORDER — SODIUM CHLORIDE 0.9 % IV SOLN
Freq: Once | INTRAVENOUS | Status: AC
  Administered 2014-07-18: 09:00:00 via INTRAVENOUS

## 2014-07-18 MED ORDER — ONDANSETRON HCL 8 MG PO TABS
8.0000 mg | ORAL_TABLET | Freq: Once | ORAL | Status: AC
  Administered 2014-07-18: 8 mg via ORAL

## 2014-07-18 MED ORDER — BORTEZOMIB CHEMO SQ INJECTION 3.5 MG (2.5MG/ML)
1.3000 mg/m2 | Freq: Once | INTRAMUSCULAR | Status: AC
  Administered 2014-07-18: 2.75 mg via SUBCUTANEOUS
  Filled 2014-07-18: qty 2.75

## 2014-07-18 MED ORDER — FUROSEMIDE 10 MG/ML IJ SOLN
20.0000 mg | Freq: Once | INTRAMUSCULAR | Status: AC
  Administered 2014-07-18: 10 mg via INTRAVENOUS

## 2014-07-18 MED ORDER — ACETAMINOPHEN 325 MG PO TABS
ORAL_TABLET | ORAL | Status: AC
  Filled 2014-07-18: qty 2

## 2014-07-18 MED ORDER — DIPHENHYDRAMINE HCL 25 MG PO CAPS
ORAL_CAPSULE | ORAL | Status: AC
  Filled 2014-07-18: qty 2

## 2014-07-18 NOTE — Patient Instructions (Signed)
Zoledronic Acid injection (Hypercalcemia, Oncology) What is this medicine? ZOLEDRONIC ACID (ZOE le dron ik AS id) lowers the amount of calcium loss from bone. It is used to treat too much calcium in your blood from cancer. It is also used to prevent complications of cancer that has spread to the bone. This medicine may be used for other purposes; ask your health care provider or pharmacist if you have questions. COMMON BRAND NAME(S): Zometa What should I tell my health care provider before I take this medicine? They need to know if you have any of these conditions: -aspirin-sensitive asthma -cancer, especially if you are receiving medicines used to treat cancer -dental disease or wear dentures -infection -kidney disease -receiving corticosteroids like dexamethasone or prednisone -an unusual or allergic reaction to zoledronic acid, other medicines, foods, dyes, or preservatives -pregnant or trying to get pregnant -breast-feeding How should I use this medicine? This medicine is for infusion into a vein. It is given by a health care professional in a hospital or clinic setting. Talk to your pediatrician regarding the use of this medicine in children. Special care may be needed. Overdosage: If you think you have taken too much of this medicine contact a poison control center or emergency room at once. NOTE: This medicine is only for you. Do not share this medicine with others. What if I miss a dose? It is important not to miss your dose. Call your doctor or health care professional if you are unable to keep an appointment. What may interact with this medicine? -certain antibiotics given by injection -NSAIDs, medicines for pain and inflammation, like ibuprofen or naproxen -some diuretics like bumetanide, furosemide -teriparatide -thalidomide This list may not describe all possible interactions. Give your health care provider a list of all the medicines, herbs, non-prescription drugs, or  dietary supplements you use. Also tell them if you smoke, drink alcohol, or use illegal drugs. Some items may interact with your medicine. What should I watch for while using this medicine? Visit your doctor or health care professional for regular checkups. It may be some time before you see the benefit from this medicine. Do not stop taking your medicine unless your doctor tells you to. Your doctor may order blood tests or other tests to see how you are doing. Women should inform their doctor if they wish to become pregnant or think they might be pregnant. There is a potential for serious side effects to an unborn child. Talk to your health care professional or pharmacist for more information. You should make sure that you get enough calcium and vitamin D while you are taking this medicine. Discuss the foods you eat and the vitamins you take with your health care professional. Some people who take this medicine have severe bone, joint, and/or muscle pain. This medicine may also increase your risk for jaw problems or a broken thigh bone. Tell your doctor right away if you have severe pain in your jaw, bones, joints, or muscles. Tell your doctor if you have any pain that does not go away or that gets worse. Tell your dentist and dental surgeon that you are taking this medicine. You should not have major dental surgery while on this medicine. See your dentist to have a dental exam and fix any dental problems before starting this medicine. Take good care of your teeth while on this medicine. Make sure you see your dentist for regular follow-up appointments. What side effects may I notice from receiving this medicine? Side effects that   you should report to your doctor or health care professional as soon as possible: -allergic reactions like skin rash, itching or hives, swelling of the face, lips, or tongue -anxiety, confusion, or depression -breathing problems -changes in vision -eye pain -feeling faint or  lightheaded, falls -jaw pain, especially after dental work -mouth sores -muscle cramps, stiffness, or weakness -trouble passing urine or change in the amount of urine Side effects that usually do not require medical attention (report to your doctor or health care professional if they continue or are bothersome): -bone, joint, or muscle pain -constipation -diarrhea -fever -hair loss -irritation at site where injected -loss of appetite -nausea, vomiting -stomach upset -trouble sleeping -trouble swallowing -weak or tired This list may not describe all possible side effects. Call your doctor for medical advice about side effects. You may report side effects to FDA at 1-800-FDA-1088. Where should I keep my medicine? This drug is given in a hospital or clinic and will not be stored at home. NOTE: This sheet is a summary. It may not cover all possible information. If you have questions about this medicine, talk to your doctor, pharmacist, or health care provider.  2015, [employer] Standard. (2012-09-14 13:03:13)  Bortezomib injection What is this medicine? BORTEZOMIB (bor TEZ oh mib) is a chemotherapy drug. It slows the growth of cancer cells. This medicine is used to treat multiple myeloma, and certain lymphomas, such as mantle-cell lymphoma. This medicine may be used for other purposes; ask your health care provider or pharmacist if you have questions. COMMON BRAND NAME(S): Velcade What should I tell my health care provider before I take this medicine? They need to know if you have any of these conditions: -diabetes -heart disease -irregular heartbeat -liver disease -on hemodialysis -low blood counts, like low white blood cells, platelets, or hemoglobin -peripheral neuropathy -taking medicine for blood pressure -an unusual or allergic reaction to bortezomib, mannitol, boron, other medicines, foods, dyes, or preservatives -pregnant or trying to get pregnant -breast-feeding How  should I use this medicine? This medicine is for injection into a vein or for injection under the skin. It is given by a health care professional in a hospital or clinic setting. Talk to your pediatrician regarding the use of this medicine in children. Special care may be needed. Overdosage: If you think you have taken too much of this medicine contact a poison control center or emergency room at once. NOTE: This medicine is only for you. Do not share this medicine with others. What if I miss a dose? It is important not to miss your dose. Call your doctor or health care professional if you are unable to keep an appointment. What may interact with this medicine? This medicine may interact with the following medications: -ketoconazole -rifampin -ritonavir -St. John's Wort This list may not describe all possible interactions. Give your health care provider a list of all the medicines, herbs, non-prescription drugs, or dietary supplements you use. Also tell them if you smoke, drink alcohol, or use illegal drugs. Some items may interact with your medicine. What should I watch for while using this medicine? Visit your doctor for checks on your progress. This drug may make you feel generally unwell. This is not uncommon, as chemotherapy can affect healthy cells as well as cancer cells. Report any side effects. Continue your course of treatment even though you feel ill unless your doctor tells you to stop. You may get drowsy or dizzy. Do not drive, use machinery, or do anything that needs mental   alertness until you know how this medicine affects you. Do not stand or sit up quickly, especially if you are an older patient. This reduces the risk of dizzy or fainting spells. In some cases, you may be given additional medicines to help with side effects. Follow all directions for their use. Call your doctor or health care professional for advice if you get a fever, chills or sore throat, or other symptoms of a  cold or flu. Do not treat yourself. This drug decreases your body's ability to fight infections. Try to avoid being around people who are sick. This medicine may increase your risk to bruise or bleed. Call your doctor or health care professional if you notice any unusual bleeding. You may need blood work done while you are taking this medicine. In some patients, this medicine may cause a serious brain infection that may cause death. If you have any problems seeing, thinking, speaking, walking, or standing, tell your doctor right away. If you cannot reach your doctor, urgently seek other source of medical care. Do not become pregnant while taking this medicine. Women should inform their doctor if they wish to become pregnant or think they might be pregnant. There is a potential for serious side effects to an unborn child. Talk to your health care professional or pharmacist for more information. Do not breast-feed an infant while taking this medicine. Check with your doctor or health care professional if you get an attack of severe diarrhea, nausea and vomiting, or if you sweat a lot. The loss of too much body fluid can make it dangerous for you to take this medicine. What side effects may I notice from receiving this medicine? Side effects that you should report to your doctor or health care professional as soon as possible: -allergic reactions like skin rash, itching or hives, swelling of the face, lips, or tongue -breathing problems -changes in hearing -changes in vision -fast, irregular heartbeat -feeling faint or lightheaded, falls -pain, tingling, numbness in the hands or feet -right upper belly pain -seizures -swelling of the ankles, feet, hands -unusual bleeding or bruising -unusually weak or tired -vomiting -yellowing of the eyes or skin Side effects that usually do not require medical attention (report to your doctor or health care professional if they continue or are  bothersome): -changes in emotions or moods -constipation -diarrhea -loss of appetite -headache -irritation at site where injected -nausea This list may not describe all possible side effects. Call your doctor for medical advice about side effects. You may report side effects to FDA at 1-800-FDA-1088. Where should I keep my medicine? This drug is given in a hospital or clinic and will not be stored at home. NOTE: This sheet is a summary. It may not cover all possible information. If you have questions about this medicine, talk to your doctor, pharmacist, or health care provider.  2015, [employer]/Gold Standard. (2013-01-29 12:46:32)  Blood Transfusion Information WHAT IS A BLOOD TRANSFUSION? A transfusion is the replacement of blood or some of its parts. Blood is made up of multiple cells which provide different functions.  Red blood cells carry oxygen and are used for blood loss replacement.  White blood cells fight against infection.  Platelets control bleeding.  Plasma helps clot blood.  Other blood products are available for specialized needs, such as hemophilia or other clotting disorders. BEFORE THE TRANSFUSION  Who gives blood for transfusions?   You may be able to donate blood to be used at a later date on yourself (  autologous donation).  Relatives can be asked to donate blood. This is generally not any safer than if you have received blood from a stranger. The same precautions are taken to ensure safety when a relative's blood is donated.  Healthy volunteers who are fully evaluated to make sure their blood is safe. This is blood bank blood. Transfusion therapy is the safest it has ever been in the practice of medicine. Before blood is taken from a donor, a complete history is taken to make sure that person has no history of diseases nor engages in risky social behavior (examples are intravenous drug use or sexual activity with multiple partners). The donor's travel history  is screened to minimize risk of transmitting infections, such as malaria. The donated blood is tested for signs of infectious diseases, such as HIV and hepatitis. The blood is then tested to be sure it is compatible with you in order to minimize the chance of a transfusion reaction. If you or a relative donates blood, this is often done in anticipation of surgery and is not appropriate for emergency situations. It takes many days to process the donated blood. RISKS AND COMPLICATIONS Although transfusion therapy is very safe and saves many lives, the main dangers of transfusion include:   Getting an infectious disease.  Developing a transfusion reaction. This is an allergic reaction to something in the blood you were given. Every precaution is taken to prevent this. The decision to have a blood transfusion has been considered carefully by your caregiver before blood is given. Blood is not given unless the benefits outweigh the risks. AFTER THE TRANSFUSION  Right after receiving a blood transfusion, you will usually feel much better and more energetic. This is especially true if your red blood cells have gotten low (anemic). The transfusion raises the level of the red blood cells which carry oxygen, and this usually causes an energy increase.  The nurse administering the transfusion will monitor you carefully for complications. HOME CARE INSTRUCTIONS  No special instructions are needed after a transfusion. You may find your energy is better. Speak with your caregiver about any limitations on activity for underlying diseases you may have. SEEK MEDICAL CARE IF:   Your condition is not improving after your transfusion.  You develop redness or irritation at the intravenous (IV) site. SEEK IMMEDIATE MEDICAL CARE IF:  Any of the following symptoms occur over the next 12 hours:  Shaking chills.  You have a temperature by mouth above 102 F (38.9 C), not controlled by medicine.  Chest, back, or  muscle pain.  People around you feel you are not acting correctly or are confused.  Shortness of breath or difficulty breathing.  Dizziness and fainting.  You get a rash or develop hives.  You have a decrease in urine output.  Your urine turns a dark color or changes to pink, red, or brown. Any of the following symptoms occur over the next 10 days:  You have a temperature by mouth above 102 F (38.9 C), not controlled by medicine.  Shortness of breath.  Weakness after normal activity.  The white part of the eye turns yellow (jaundice).  You have a decrease in the amount of urine or are urinating less often.  Your urine turns a dark color or changes to pink, red, or brown. Document Released: 04/02/2000 Document Revised: 06/28/2011 Document Reviewed: 11/20/2007 [facility] Patient Information 2015 [place], [place]. This information is not intended to replace advice given to you by your health care provider.  Make sure you discuss any questions you have with your health care provider.

## 2014-07-19 ENCOUNTER — Encounter: Payer: Self-pay | Admitting: Hematology & Oncology

## 2014-07-19 ENCOUNTER — Ambulatory Visit: Payer: BLUE CROSS/BLUE SHIELD

## 2014-07-19 LAB — TYPE AND SCREEN
ABO/RH(D): O POS
Antibody Screen: NEGATIVE
Unit division: 0
Unit division: 0

## 2014-07-20 ENCOUNTER — Encounter (HOSPITAL_COMMUNITY): Payer: Self-pay | Admitting: Emergency Medicine

## 2014-07-20 ENCOUNTER — Emergency Department (HOSPITAL_COMMUNITY): Payer: BLUE CROSS/BLUE SHIELD

## 2014-07-20 ENCOUNTER — Inpatient Hospital Stay (HOSPITAL_COMMUNITY)
Admission: EM | Admit: 2014-07-20 | Discharge: 2014-07-21 | DRG: 812 | Disposition: A | Discharge status: Home / Self Care | Payer: BLUE CROSS/BLUE SHIELD | Attending: Internal Medicine | Admitting: Internal Medicine

## 2014-07-20 DIAGNOSIS — Z79899 Other long term (current) drug therapy: Secondary | ICD-10-CM | POA: Diagnosis not present

## 2014-07-20 DIAGNOSIS — D649 Anemia, unspecified: Secondary | ICD-10-CM

## 2014-07-20 DIAGNOSIS — C9000 Multiple myeloma not having achieved remission: Secondary | ICD-10-CM | POA: Diagnosis present

## 2014-07-20 DIAGNOSIS — N182 Chronic kidney disease, stage 2 (mild): Secondary | ICD-10-CM | POA: Diagnosis present

## 2014-07-20 DIAGNOSIS — D638 Anemia in other chronic diseases classified elsewhere: Principal | ICD-10-CM | POA: Diagnosis present

## 2014-07-20 DIAGNOSIS — Z7982 Long term (current) use of aspirin: Secondary | ICD-10-CM

## 2014-07-20 DIAGNOSIS — D72819 Decreased white blood cell count, unspecified: Secondary | ICD-10-CM | POA: Diagnosis present

## 2014-07-20 DIAGNOSIS — D696 Thrombocytopenia, unspecified: Secondary | ICD-10-CM | POA: Diagnosis present

## 2014-07-20 DIAGNOSIS — E860 Dehydration: Secondary | ICD-10-CM | POA: Diagnosis present

## 2014-07-20 DIAGNOSIS — R509 Fever, unspecified: Secondary | ICD-10-CM

## 2014-07-20 DIAGNOSIS — E871 Hypo-osmolality and hyponatremia: Secondary | ICD-10-CM | POA: Diagnosis present

## 2014-07-20 LAB — CBC WITH DIFFERENTIAL/PLATELET
Basophils Absolute: 0 10*3/uL (ref 0.0–0.1)
Basophils Relative: 0 % (ref 0–1)
Eosinophils Absolute: 0 10*3/uL (ref 0.0–0.7)
Eosinophils Relative: 1 % (ref 0–5)
HCT: 20.3 % — ABNORMAL LOW (ref 39.0–52.0)
Hemoglobin: 6.6 g/dL — CL (ref 13.0–17.0)
Lymphocytes Relative: 21 % (ref 12–46)
Lymphs Abs: 0.8 10*3/uL (ref 0.7–4.0)
MCH: 32.8 pg (ref 26.0–34.0)
MCHC: 32.5 g/dL (ref 30.0–36.0)
MCV: 101 fL — ABNORMAL HIGH (ref 78.0–100.0)
Monocytes Absolute: 0.1 10*3/uL (ref 0.1–1.0)
Monocytes Relative: 4 % (ref 3–12)
Neutro Abs: 2.7 10*3/uL (ref 1.7–7.7)
Neutrophils Relative %: 74 % (ref 43–77)
Platelets: 71 10*3/uL — ABNORMAL LOW (ref 150–400)
RBC: 2.01 MIL/uL — ABNORMAL LOW (ref 4.22–5.81)
RDW: 17.6 % — ABNORMAL HIGH (ref 11.5–15.5)
WBC: 3.6 10*3/uL — ABNORMAL LOW (ref 4.0–10.5)

## 2014-07-20 LAB — COMPREHENSIVE METABOLIC PANEL
ALT: 73 U/L — ABNORMAL HIGH (ref 0–53)
AST: 38 U/L — ABNORMAL HIGH (ref 0–37)
Albumin: 3.2 g/dL — ABNORMAL LOW (ref 3.5–5.2)
Alkaline Phosphatase: 40 U/L (ref 39–117)
Anion gap: 5 (ref 5–15)
BUN: 30 mg/dL — ABNORMAL HIGH (ref 6–23)
CO2: 21 mmol/L (ref 19–32)
Calcium: 7.9 mg/dL — ABNORMAL LOW (ref 8.4–10.5)
Chloride: 103 mmol/L (ref 96–112)
Creatinine, Ser: 1.66 mg/dL — ABNORMAL HIGH (ref 0.50–1.35)
GFR calc Af Amer: 54 mL/min — ABNORMAL LOW (ref >90)
GFR calc non Af Amer: 47 mL/min — ABNORMAL LOW (ref >90)
Glucose, Bld: 87 mg/dL (ref 70–99)
Potassium: 3.8 mmol/L (ref 3.5–5.1)
Sodium: 129 mmol/L — ABNORMAL LOW (ref 135–145)
Total Bilirubin: 1.2 mg/dL (ref 0.3–1.2)
Total Protein: 10.6 g/dL — ABNORMAL HIGH (ref 6.0–8.3)

## 2014-07-20 LAB — URINALYSIS, ROUTINE W REFLEX MICROSCOPIC
Bilirubin Urine: NEGATIVE
Glucose, UA: NEGATIVE mg/dL
Hgb urine dipstick: NEGATIVE
Ketones, ur: NEGATIVE mg/dL
Leukocytes, UA: NEGATIVE
Nitrite: NEGATIVE
Protein, ur: NEGATIVE mg/dL
Specific Gravity, Urine: 1.006 (ref 1.005–1.030)
Urobilinogen, UA: 0.2 mg/dL (ref 0.0–1.0)
pH: 6.5 (ref 5.0–8.0)

## 2014-07-20 LAB — I-STAT CG4 LACTIC ACID, ED: Lactic Acid, Venous: 0.94 mmol/L (ref 0.5–2.0)

## 2014-07-20 LAB — PREPARE RBC (CROSSMATCH)

## 2014-07-20 LAB — RETICULOCYTES
RBC.: 1.67 MIL/uL — ABNORMAL LOW (ref 4.22–5.81)
Retic Count, Absolute: 13.4 10*3/uL — ABNORMAL LOW (ref 19.0–186.0)
Retic Ct Pct: 0.8 % (ref 0.4–3.1)

## 2014-07-20 LAB — POC OCCULT BLOOD, ED: Fecal Occult Bld: POSITIVE — AB

## 2014-07-20 LAB — LACTATE DEHYDROGENASE: LDH: 125 U/L (ref 94–250)

## 2014-07-20 MED ORDER — ONDANSETRON HCL 4 MG/2ML IJ SOLN
4.0000 mg | Freq: Four times a day (QID) | INTRAMUSCULAR | Status: DC | PRN
Start: 2014-07-20 — End: 2014-07-21

## 2014-07-20 MED ORDER — PROCHLORPERAZINE MALEATE 10 MG PO TABS: 10.0000 mg | ORAL_TABLET | Freq: Four times a day (QID) | ORAL | Status: DC | PRN

## 2014-07-20 MED ORDER — POTASSIUM CHLORIDE IN NACL 20-0.9 MEQ/L-% IV SOLN
INTRAVENOUS | Status: DC
  Administered 2014-07-21: 03:00:00 via INTRAVENOUS
  Filled 2014-07-20 (×2): qty 1000

## 2014-07-20 MED ORDER — SODIUM CHLORIDE 0.9 % IV SOLN
INTRAVENOUS | Status: DC
  Administered 2014-07-20: 20:00:00 via INTRAVENOUS

## 2014-07-20 MED ORDER — ALUM & MAG HYDROXIDE-SIMETH 200-200-20 MG/5ML PO SUSP: 30.0000 mL | Freq: Four times a day (QID) | ORAL | Status: DC | PRN

## 2014-07-20 MED ORDER — ASPIRIN EC 325 MG PO TBEC
325.0000 mg | DELAYED_RELEASE_TABLET | Freq: Every day | ORAL | Status: DC
  Administered 2014-07-20: 325 mg via ORAL
  Filled 2014-07-20 (×2): qty 1

## 2014-07-20 MED ORDER — POLYETHYLENE GLYCOL 3350 17 G PO PACK
17.0000 g | PACK | Freq: Every day | ORAL | Status: DC | PRN
  Administered 2014-07-21: 17 g via ORAL

## 2014-07-20 MED ORDER — GUAIFENESIN-DM 100-10 MG/5ML PO SYRP: 5.0000 mL | ORAL_SOLUTION | ORAL | Status: DC | PRN

## 2014-07-20 MED ORDER — SODIUM CHLORIDE 0.9 % IV BOLUS (SEPSIS)
1000.0000 mL | INTRAVENOUS | Status: DC
  Administered 2014-07-20: 1000 mL via INTRAVENOUS

## 2014-07-20 MED ORDER — OSELTAMIVIR PHOSPHATE 75 MG PO CAPS
75.0000 mg | ORAL_CAPSULE | Freq: Two times a day (BID) | ORAL | Status: DC
  Administered 2014-07-20: 75 mg via ORAL
  Filled 2014-07-20 (×3): qty 1

## 2014-07-20 MED ORDER — SODIUM CHLORIDE 0.9 % IV SOLN: 10.0000 mL/h | Freq: Once | INTRAVENOUS | Status: DC

## 2014-07-20 MED ORDER — HYDROCODONE-ACETAMINOPHEN 5-325 MG PO TABS: 1.0000 | ORAL_TABLET | ORAL | Status: DC | PRN

## 2014-07-20 MED ORDER — VALACYCLOVIR HCL 500 MG PO TABS
1000.0000 mg | ORAL_TABLET | Freq: Every day | ORAL | Status: DC
  Filled 2014-07-20: qty 2

## 2014-07-20 MED ORDER — ONDANSETRON HCL 4 MG PO TABS: 4.0000 mg | ORAL_TABLET | Freq: Four times a day (QID) | ORAL | Status: DC | PRN

## 2014-07-20 MED ORDER — ACETAMINOPHEN 325 MG PO TABS
650.0000 mg | ORAL_TABLET | Freq: Once | ORAL | Status: AC
  Administered 2014-07-20: 650 mg via ORAL
  Filled 2014-07-20: qty 2

## 2014-07-20 MED ORDER — LORAZEPAM 0.5 MG PO TABS: 0.5000 mg | ORAL_TABLET | Freq: Four times a day (QID) | ORAL | Status: DC | PRN

## 2014-07-20 MED ORDER — DEXAMETHASONE 4 MG PO TABS
4.0000 mg | ORAL_TABLET | Freq: Every day | ORAL | Status: DC
  Filled 2014-07-20 (×2): qty 1

## 2014-07-20 NOTE — ED Notes (Signed)
Nurse drawing labs. 

## 2014-07-20 NOTE — ED Notes (Signed)
Pt has red area and bruising noted to LLQ from Velcade injection. Will mark red area and monitor.

## 2014-07-20 NOTE — ED Notes (Signed)
Admitting MD at bedside.

## 2014-07-20 NOTE — ED Notes (Signed)
Pt transported to XRAY °

## 2014-07-20 NOTE — ED Notes (Signed)
Rectal exam performed by Dr. Canary Brim with ED tech at bedside Specimen collected Bed assigned--will attempt to call report

## 2014-07-20 NOTE — ED Notes (Signed)
Phlebotomy at bedside.

## 2014-07-20 NOTE — ED Notes (Addendum)
Pt being treated for multiple myeloma. Had first chemo on Thursday. Noticed he had a fever as high as 102.54F, has not taken tylenol. Also received 2 units of blood on Thursday.

## 2014-07-20 NOTE — ED Notes (Addendum)
PA Marissa notified of Patient's hemoglobin at 6.6 and temperature spike of 101.3 despite tylenol. Awaiting further orders.

## 2014-07-20 NOTE — H&P (Addendum)
Patient Demographics  Tyler Phillips, is a 49 y.o. male  MRN: [id number]   DOB - [birth year]  Admit Date - 07/20/2014  Outpatient Primary MD for the patient is [name], MD   With History of -  Past Medical History  Diagnosis Date  . Myeloma       History reviewed. No pertinent past surgical history.  in for   Chief Complaint  Patient presents with  . Fever  . cancer pt on chemo      HPI  Tyler Phillips  is a 49 y.o. male, with recently diagnosed history of multiple myeloma under the care of Dr. [name], CK D stage II, anemia of chronic disease received 2 units of packed RBC this Thursday, who was started on chemotherapy with Velcade and Revlimid  this Thursday, comes into the hospital with 1 day history of fevers and chills at home along with mild body aches, denies any headache or runny nose, no cough phlegm or shortness of breath, no abdominal pain or diarrhea, no skin rashes or bruises, no joint pains or aches. No exposure to sick contacts or recent travel.  In the ER his workup showed anemia, mild renal insufficiency, UA and chest x-ray were unremarkable. I was called to admit the patient for anemia and fevers. Patient notes that he did not take flu shot this year.    Review of Systems    In addition to the HPI above,   +ve Fever-chills, and mild generalized body aches No Headache, No changes with Vision or hearing, No problems swallowing food or Liquids, No Chest pain, Cough or Shortness of Breath, No Abdominal pain, No Nausea or Vommitting, Bowel movements are regular, No Blood in stool or Urine, No dysuria, No new skin rashes or bruises, No new joints pains-aches,  No new weakness, tingling, numbness in any extremity, No recent weight gain or loss, No polyuria, polydypsia or  polyphagia, No significant Mental Stressors.  A full 10 point Review of Systems was done, except as stated above, all other Review of Systems were negative.   Social History History  Substance Use Topics  . Smoking status: Never Smoker   . Smokeless tobacco: Never Used     Comment: NEVER USED TOBACCO  . Alcohol Use: 4.2 oz/week    0 Standard drinks or equivalent, 7 Glasses of wine per week      Family History No history of myeloma  Prior to Admission medications   Medication Sig Start Date End Date Taking? Authorizing Provider  aspirin 325 MG EC tablet Take 325 mg by mouth daily.   Yes Historical Provider, MD  dexamethasone (DECADRON) 4 MG tablet Take 5 pills at one time once a week with food for 3 weeks then 1 week off 07/15/14  Yes [name], MD  famciclovir (FAMVIR) 500 MG tablet Take 1 tablet (500 mg total) by mouth daily. 07/15/14  Yes [name], MD  lenalidomide (REVLIMID) 25 MG capsule Take 1 capsule (25 mg total) by mouth daily. Take for 21 days and 7 days off. 07/16/14  Yes [name], MD  LORazepam (ATIVAN) 0.5 MG tablet Take 1 tablet (0.5 mg total) by mouth every 6 (six) hours as needed (Nausea or vomiting). 07/18/14  Yes [name], MD  ondansetron (ZOFRAN) 8 MG tablet Take 1 tablet (8 mg total) by mouth 2 (two) times daily. Start the day after chemo for 2 days. Then take as needed for nausea or vomiting. 07/18/14  Yes [name], MD  prochlorperazine (COMPAZINE) 10 MG tablet Take 1 tablet (10 mg total) by mouth every 6 (six) hours as needed (Nausea or vomiting). 07/18/14  Yes [name], MD    No Known Allergies  Physical Exam  Vitals  Blood pressure 121/61, pulse 89, temperature 100.7 F (38.2 C), temperature source Oral, resp. rate 20, weight 89.812 kg (198 lb), SpO2 98 %.   1. General middle aged white male lying in bed in NAD,     2. Normal affect and insight, Not Suicidal or Homicidal, Awake Alert, Oriented X 3.  3. No F.N  deficits, ALL C.Nerves Intact, Strength 5/5 all 4 extremities, Sensation intact all 4 extremities, Plantars down going.  4. Ears and Eyes appear Normal, Conjunctivae clear, PERRLA. Moist Oral Mucosa.  5. Supple Neck, No JVD, No cervical lymphadenopathy appriciated, No Carotid Bruits.  6. Symmetrical Chest wall movement, Good air movement bilaterally, CTAB.  7. RRR, No Gallops, Rubs or Murmurs, No Parasternal Heave.  8. Positive Bowel Sounds, Abdomen Soft, No tenderness, No organomegaly appriciated,No rebound -guarding or rigidity.  9.  No Cyanosis, Normal Skin Turgor, No Skin Rash or Bruise.  10. Good muscle tone,  joints appear normal , no effusions, Normal ROM.  11. No Palpable Lymph Nodes in Neck or Axillae     Data Review  CBC  Recent Labs Lab 07/15/14 1206 07/20/14 1757  WBC 3.3* 3.6*  HGB 6.9* 6.6*  HCT 20.9* 20.3*  PLT 78* 71*  MCV 103* 101.0*  MCH 34.0* 32.8  MCHC 33.0 32.5  RDW 16.3* 17.6*  LYMPHSABS 1.5 0.8  MONOABS  --  0.1  EOSABS 0.1 0.0  BASOSABS 0.0 0.0   ------------------------------------------------------------------------------------------------------------------  Chemistries   Recent Labs Lab 07/20/14 1757  NA 129*  K 3.8  CL 103  CO2 21  GLUCOSE 87  BUN 30*  CREATININE 1.66*  CALCIUM 7.9*  AST 38*  ALT 73*  ALKPHOS 40  BILITOT 1.2   ------------------------------------------------------------------------------------------------------------------ estimated creatinine clearance is 59.1 mL/min (by C-G formula based on Cr of 1.66). ------------------------------------------------------------------------------------------------------------------ No results for input(s): TSH, T4TOTAL, T3FREE, THYROIDAB in the last 72 hours.  Invalid input(s): FREET3   Coagulation profile No results for input(s): INR, PROTIME in the last 168  hours. ------------------------------------------------------------------------------------------------------------------- No results for input(s): DDIMER in the last 72 hours. -------------------------------------------------------------------------------------------------------------------  Cardiac Enzymes No results for input(s): CKMB, TROPONINI, MYOGLOBIN in the last 168 hours.  Invalid input(s): CK ------------------------------------------------------------------------------------------------------------------ Invalid input(s): POCBNP   ---------------------------------------------------------------------------------------------------------------  Urinalysis    Component Value Date/Time   COLORURINE YELLOW 07/20/2014 [place] 07/20/2014 1743   LABSPEC 1.006 07/20/2014 1743   PHURINE 6.5 07/20/2014 1743   GLUCOSEU NEGATIVE 07/20/2014 1743   HGBUR NEGATIVE 07/20/2014 1743   BILIRUBINUR NEGATIVE 07/20/2014 1743   KETONESUR NEGATIVE 07/20/2014 1743   PROTEINUR NEGATIVE 07/20/2014 1743   UROBILINOGEN 0.2 07/20/2014 1743   NITRITE NEGATIVE 07/20/2014 1743   LEUKOCYTESUR  NEGATIVE 07/20/2014 1743    ----------------------------------------------------------------------------------------------------------------  Imaging results:   Dg Chest 2 View  07/20/2014   CLINICAL DATA:  Fever for 1 day.  Multiple myeloma  EXAM: CHEST  2 VIEW  COMPARISON:  None.  FINDINGS: The heart size and mediastinal contours are within normal limits. Both lungs are clear. The visualized skeletal structures are unremarkable.  IMPRESSION: No active cardiopulmonary disease.   Electronically Signed   By: Earle Gell M.D.   On: 07/20/2014 18:40         Assessment & Plan   1. Fever 1 day in a patient with recent diagnosis of multiple myeloma who was started on chemotherapy 2 days ago with Velcade and Revlimid. No clear source of infection, chest x-ray UA clear, did not take flu shot  this year.   This could be influenza versus side effect of his chemotherapy or could be an occult infection, blood cultures have been drawn, for now will with hold antibiotics as he appears stable and nontoxic more so he is not neutropenic, we'll place him on Tamiflu, check influenza PCR. Continue his home dose Decadron along with aspirin.   I have discussed his case with oncologist on call Dr Jana Hakim today. He agrees with the plan and will see the patient in the morning.    2. Anemia of chronic disease. Received 2 units of packed RBC this Thursday however he still quite anemic, denies any melena or blood in stool. We will check anemia panel, will check LDH and haptoglobin along with a peripheral smear. Will transfuse 2 units of packed RBCs today. Repeat H&H in the morning.      3. Chronic kidney disease stage II to 3. Likely due to multiple myeloma, Baseline creatinine close to 1.6. He is at baseline. Avoid nephrotoxins. Benefit from outpatient nephrology follow-up post discharge.    4. Mild hyponatremia. Dehydration versus SIADH. Gently hydrate, check urine electrolytes and osmolality, check serum osmolality, repeat BMP in the morning. Euvolemic on exam.      DVT Prophylaxis   SCDs    AM Labs Ordered, also please review Full Orders  Family Communication: Admission, patients condition and plan of care including tests being ordered have been discussed with the patient and wife who indicate understanding and agree with the plan and Code Status.  Code Status Full  Likely DC to Home  Condition GUARDED     Time spent in minutes : 35    Lorren Rossetti K M.D on 07/20/2014 at 8:23 PM  Between 7am to 7pm - Pager - [phone]  After 7pm go to [email or web address] - password [facility]  [facility]  Office  [phone]

## 2014-07-20 NOTE — ED Notes (Signed)
Report called to floor All questions answered Patient in NAD upon transfer to floor

## 2014-07-20 NOTE — ED Provider Notes (Signed)
CSN: [id number]     Arrival date & time 07/20/14  1727 History   First MD Initiated Contact with Patient 07/20/14 1754     Chief Complaint  Patient presents with  . Fever  . cancer pt on chemo      (Consider location/radiation/quality/duration/timing/severity/associated sxs/prior Treatment) The history is provided by the patient. No language interpreter was used.  Tyler Phillips is a 49 y/o M with PMHx of multiple myeloma, just diagnosed on 07/03/2014 followed by Dr. [name] presenting to the ED with fever that started today. Patient reported that his fever has been as high as 102.22F - stated that he has not taken any Tylenol or Ibuprofen today. Reported that upon arrival to the ED he noticed that his fever was 100.22F. Reported that he was seen on Thursday, 07/18/3014 where he was started on his first round of chemo, given IM injection of Velcade and transfused 2 Units of RBCs secondary to low Hgb. Patient reported that he has been feeling fine and denied any other complaint. Denied cough, nasal congestion, sore throat, difficulty swallowing, hemoptysis, nausea, vomiting, diarrhea, melena, hematochezia, abdominal pain, neck pain, neck stiffness, back pain, urinary symptoms, travels, sick contacts. PCP Dr. Doyle Askew Oncologist Dr. [name]  Past Medical History  Diagnosis Date  . Myeloma    History reviewed. No pertinent past surgical history. History reviewed. No pertinent family history. History  Substance Use Topics  . Smoking status: Never Smoker   . Smokeless tobacco: Never Used     Comment: NEVER USED TOBACCO  . Alcohol Use: 4.2 oz/week    0 Standard drinks or equivalent, 7 Glasses of wine per week    Review of Systems  Constitutional: Positive for fever. Negative for chills.  HENT: Negative for congestion and sore throat.   Eyes: Negative for visual disturbance.  Respiratory: Negative for cough, chest tightness and shortness of breath.   Cardiovascular: Negative for chest pain.   Gastrointestinal: Negative for nausea, vomiting, abdominal pain, diarrhea, constipation, blood in stool and anal bleeding.  Genitourinary: Negative for dysuria, hematuria and decreased urine volume.  Musculoskeletal: Negative for back pain, arthralgias, neck pain and neck stiffness.  Neurological: Negative for dizziness, weakness and headaches.      Allergies  Review of patient's allergies indicates no known allergies.  Home Medications   Prior to Admission medications   Medication Sig Start Date End Date Taking? Authorizing Provider  aspirin 325 MG EC tablet Take 325 mg by mouth daily.   Yes Historical Provider, MD  dexamethasone (DECADRON) 4 MG tablet Take 5 pills at one time once a week with food for 3 weeks then 1 week off 07/15/14  Yes [name], MD  famciclovir (FAMVIR) 500 MG tablet Take 1 tablet (500 mg total) by mouth daily. 07/15/14  Yes [name], MD  lenalidomide (REVLIMID) 25 MG capsule Take 1 capsule (25 mg total) by mouth daily. Take for 21 days and 7 days off. 07/16/14  Yes [name], MD  LORazepam (ATIVAN) 0.5 MG tablet Take 1 tablet (0.5 mg total) by mouth every 6 (six) hours as needed (Nausea or vomiting). 07/18/14  Yes [name], MD  ondansetron (ZOFRAN) 8 MG tablet Take 1 tablet (8 mg total) by mouth 2 (two) times daily. Start the day after chemo for 2 days. Then take as needed for nausea or vomiting. 07/18/14  Yes [name], MD  prochlorperazine (COMPAZINE) 10 MG tablet Take 1 tablet (10 mg total) by mouth every 6 (six)  hours as needed (Nausea or vomiting). 07/18/14  Yes [name], MD   BP 121/61 mmHg  Pulse 89  Temp(Src) 100.7 F (38.2 C) (Oral)  Resp 20  Wt 198 lb (89.812 kg)  SpO2 98% Physical Exam  Constitutional: He is oriented to person, place, and time. He appears well-developed and well-nourished. No distress.  HENT:  Head: Normocephalic and atraumatic.  Mouth/Throat: Oropharynx is clear and moist. No oropharyngeal  exudate.  Eyes: Conjunctivae and EOM are normal. Pupils are equal, round, and reactive to light. Right eye exhibits no discharge. Left eye exhibits no discharge.  Neck: Normal range of motion. Neck supple. No tracheal deviation present.  Negative neck stiffness Negative nuchal rigidity  Negative cervical lymphadenopathy  Negative meningeal signs   Cardiovascular: Normal rate, regular rhythm and normal heart sounds.  Exam reveals no friction rub.   No murmur heard. Pulmonary/Chest: Effort normal and breath sounds normal. No respiratory distress. He has no wheezes. He has no rales. He exhibits no tenderness.  Abdominal: Soft. Bowel sounds are normal. He exhibits no distension. There is no tenderness. There is no rebound and no guarding.  Area of erythema measuring approximately 2 cm x 2 cm to the left lower quadrant - localized. Negative pain upon palpation. Mild warmth upon palpation. Negative active drainage or bleeding noted. Negative palpation of induration or fluctuance. Negative red streaks.  Genitourinary:  Rectal Exam: Negative swelling, erythema, inflammation, lesions, sores, deformities, hemorrhoids. Negative BRBPR. Negative polyps palpated. Negative blood on glove.  Exam chaperoned with tech, Joy.   Negative inguinal lymphadenopathy  Musculoskeletal: Normal range of motion.  Lymphadenopathy:    He has no cervical adenopathy.  Neurological: He is alert and oriented to person, place, and time. No cranial nerve deficit. He exhibits normal muscle tone. Coordination normal.  Skin: Skin is warm and dry. No rash noted. He is not diaphoretic. No erythema.  Psychiatric: He has a normal mood and affect. His behavior is normal. Thought content normal.  Nursing note and vitals reviewed.   ED Course  Procedures (including critical care time)  Results for orders placed or performed during the hospital encounter of 07/20/14  CBC WITH DIFFERENTIAL  Result Value Ref Range   WBC 3.6 (L) 4.0 -  10.5 K/uL   RBC 2.01 (L) 4.22 - 5.81 MIL/uL   Hemoglobin 6.6 (LL) 13.0 - 17.0 g/dL   HCT 20.3 (L) 39.0 - 52.0 %   MCV 101.0 (H) 78.0 - 100.0 fL   MCH 32.8 26.0 - 34.0 pg   MCHC 32.5 30.0 - 36.0 g/dL   RDW 17.6 (H) 11.5 - 15.5 %   Platelets 71 (L) 150 - 400 K/uL   Neutrophils Relative % 74 43 - 77 %   Lymphocytes Relative 21 12 - 46 %   Monocytes Relative 4 3 - 12 %   Eosinophils Relative 1 0 - 5 %   Basophils Relative 0 0 - 1 %   Neutro Abs 2.7 1.7 - 7.7 K/uL   Lymphs Abs 0.8 0.7 - 4.0 K/uL   Monocytes Absolute 0.1 0.1 - 1.0 K/uL   Eosinophils Absolute 0.0 0.0 - 0.7 K/uL   Basophils Absolute 0.0 0.0 - 0.1 K/uL   Smear Review MORPHOLOGY UNREMARKABLE   Comprehensive metabolic panel  Result Value Ref Range   Sodium 129 (L) 135 - 145 mmol/L   Potassium 3.8 3.5 - 5.1 mmol/L   Chloride 103 96 - 112 mmol/L   CO2 21 19 - 32 mmol/L  Glucose, Bld 87 70 - 99 mg/dL   BUN 30 (H) 6 - 23 mg/dL   Creatinine, Ser 1.66 (H) 0.50 - 1.35 mg/dL   Calcium 7.9 (L) 8.4 - 10.5 mg/dL   Total Protein 10.6 (H) 6.0 - 8.3 g/dL   Albumin 3.2 (L) 3.5 - 5.2 g/dL   AST 38 (H) 0 - 37 U/L   ALT 73 (H) 0 - 53 U/L   Alkaline Phosphatase 40 39 - 117 U/L   Total Bilirubin 1.2 0.3 - 1.2 mg/dL   GFR calc non Af Amer 47 (L) >90 mL/min   GFR calc Af Amer 54 (L) >90 mL/min   Anion gap 5 5 - 15  Urinalysis with microscopic  Result Value Ref Range   Color, Urine YELLOW YELLOW   APPearance CLEAR CLEAR   Specific Gravity, Urine 1.006 1.005 - 1.030   pH 6.5 5.0 - 8.0   Glucose, UA NEGATIVE NEGATIVE mg/dL   Hgb urine dipstick NEGATIVE NEGATIVE   Bilirubin Urine NEGATIVE NEGATIVE   Ketones, ur NEGATIVE NEGATIVE mg/dL   Protein, ur NEGATIVE NEGATIVE mg/dL   Urobilinogen, UA 0.2 0.0 - 1.0 mg/dL   Nitrite NEGATIVE NEGATIVE   Leukocytes, UA NEGATIVE NEGATIVE  I-Stat CG4 Lactic Acid, ED  Result Value Ref Range   Lactic Acid, Venous 0.94 0.5 - 2.0 mmol/L  POC occult blood, ED  Result Value Ref Range   Fecal Occult  Bld POSITIVE (A) NEGATIVE  Type and screen for Red Blood Exchange  Result Value Ref Range   ABO/RH(D) O POS    Antibody Screen NEG    Sample Expiration 07/23/2014    Unit Number [id number]    Blood Component Type RBC LR PHER2    Unit division 00    Status of Unit ALLOCATED    Transfusion Status OK TO TRANSFUSE    Crossmatch Result Compatible    Unit Number [id number]    Blood Component Type RBC LR PHER1    Unit division 00    Status of Unit ALLOCATED    Transfusion Status OK TO TRANSFUSE    Crossmatch Result Compatible   Prepare RBC  Result Value Ref Range   Order Confirmation ORDER PROCESSED BY BLOOD BANK     Labs Review Labs Reviewed  CBC WITH DIFFERENTIAL/PLATELET - Abnormal; Notable for the following:    WBC 3.6 (*)    RBC 2.01 (*)    Hemoglobin 6.6 (*)    HCT 20.3 (*)    MCV 101.0 (*)    RDW 17.6 (*)    Platelets 71 (*)    All other components within normal limits  COMPREHENSIVE METABOLIC PANEL - Abnormal; Notable for the following:    Sodium 129 (*)    BUN 30 (*)    Creatinine, Ser 1.66 (*)    Calcium 7.9 (*)    Total Protein 10.6 (*)    Albumin 3.2 (*)    AST 38 (*)    ALT 73 (*)    GFR calc non Af Amer 47 (*)    GFR calc Af Amer 54 (*)    All other components within normal limits  POC OCCULT BLOOD, ED - Abnormal; Notable for the following:    Fecal Occult Bld POSITIVE (*)    All other components within normal limits  URINE CULTURE  CULTURE, BLOOD (ROUTINE X 2)  CULTURE, BLOOD (ROUTINE X 2)  URINALYSIS, ROUTINE W REFLEX MICROSCOPIC  INFLUENZA PANEL BY PCR (TYPE A & B, H1N1)  LACTATE DEHYDROGENASE  HAPTOGLOBIN  PATHOLOGIST SMEAR REVIEW  VITAMIN B12  FOLATE  IRON AND TIBC  FERRITIN  RETICULOCYTES  OCCULT BLOOD X 1 CARD TO LAB, STOOL  CREATININE, URINE, RANDOM  OSMOLALITY, URINE  OSMOLALITY  SODIUM, URINE, RANDOM  I-STAT CG4 LACTIC ACID, ED  TYPE AND SCREEN  PREPARE RBC (CROSSMATCH)    Imaging Review Dg Chest 2 View  07/20/2014    CLINICAL DATA:  Fever for 1 day.  Multiple myeloma  EXAM: CHEST  2 VIEW  COMPARISON:  None.  FINDINGS: The heart size and mediastinal contours are within normal limits. Both lungs are clear. The visualized skeletal structures are unremarkable.  IMPRESSION: No active cardiopulmonary disease.   Electronically Signed   By: Earle Gell M.D.   On: 07/20/2014 18:40     EKG Interpretation None       7:47 PM This provider spoke with attending physician, Dr. Ledell Noss - does not recommend antibiotics secondary to patient having a normal neutrophil count. Recommended patient to be admitted for IV transfusion of blood.   8:08 PM This provider spoke with Dr. Ronnie Derby. Discussed case, labs, imaging, ED course in great detail. Reported that this is most likely flu. Reported that a flu swab has been ordered. Patient will need to be admitted.   MDM   Final diagnoses:  Fever, unspecified fever cause  Low hemoglobin    Medications  0.9 %  sodium chloride infusion ( Intravenous New Bag/Given 07/20/14 1940)  oseltamivir (TAMIFLU) capsule 75 mg (not administered)  acetaminophen (TYLENOL) tablet 650 mg (650 mg Oral Given 07/20/14 1820)    Filed Vitals:   07/20/14 1845 07/20/14 1928 07/20/14 1930 07/20/14 1939  BP: 127/70  127/70 121/61  Pulse: 93 92 88 89  Temp: 101.3 F (38.5 C)   100.7 F (38.2 C)  TempSrc: Oral   Oral  Resp: _0 Weight:      SpO2: 96% 97% 97% 98%   CBC noted  White blood cell count 3.6 - neutrophils unremarkable. Hemoglobin 6.6, hematocrit 20.3 - has decreased when compared to 5 days ago when patient's hemoglobin was 6.9 and hematocrit was 20.9.low platelets at 71.CMP identified mild hyponatremia with a sodium of 129. Elevated BUN of 30, creatinine 1.66. Lactic acid negative elevation. Urine negative for infection-negative nitrites, leukocytes, hemoglobin. Chest x-ray negative for acute cortical pulmonary disease. Blood culture 2 and urine culture pending. Patient does not  appear to be neutropenic at this time - attending physician did not recommend antibiotics at this time. Patient appears to have low Hgb of 6.6 - this appears to be lower than when compared to 5 days ago when patient's Hgb was 6.9 - patient given blood in the ED setting. Fecal occult is positive. Patient given IV fluids in the ED setting. Creatinine elevated, but this appears to be a consistent finding, 1.66 today and was 1.57 two weeks ago. Negative findings of pneumonia or UTI. Discussed case with [facility], discussed case in great detail with admitting physician - patient to be admitted for fever of unknown source. Suspicion to be flu - work-up will be performed further in the ED setting. Discussed plan for admission with patient in great detail - agreed to plan of care. Patient stable for transfer to floor.   Jamse Mead, PA-C 07/20/14 2042  Alfonzo Beers, MD 07/20/14 2046

## 2014-07-20 NOTE — ED Notes (Signed)
Dr. Canary Brim at bedside Awaiting bed placement

## 2014-07-21 ENCOUNTER — Encounter (HOSPITAL_COMMUNITY): Payer: Self-pay

## 2014-07-21 DIAGNOSIS — D696 Thrombocytopenia, unspecified: Secondary | ICD-10-CM

## 2014-07-21 LAB — INFLUENZA PANEL BY PCR (TYPE A & B)
H1N1 flu by pcr: NOT DETECTED
Influenza A By PCR: NEGATIVE
Influenza B By PCR: NEGATIVE

## 2014-07-21 LAB — HAPTOGLOBIN: Haptoglobin: 101 mg/dL (ref 34–200)

## 2014-07-21 LAB — IRON AND TIBC
Iron: 125 ug/dL (ref 42–165)
Saturation Ratios: 63 % — ABNORMAL HIGH (ref 20–55)
TIBC: 198 ug/dL — ABNORMAL LOW (ref 215–435)
UIBC: 73 ug/dL — ABNORMAL LOW (ref 125–400)

## 2014-07-21 LAB — FOLATE: Folate: 7.3 ng/mL

## 2014-07-21 LAB — OSMOLALITY, URINE: Osmolality, Ur: 219 mOsm/kg — ABNORMAL LOW (ref 390–1090)

## 2014-07-21 LAB — OSMOLALITY: Osmolality: 290 mOsm/kg (ref 275–300)

## 2014-07-21 LAB — SODIUM, URINE, RANDOM: Sodium, Ur: 26 mEq/L

## 2014-07-21 LAB — FERRITIN: Ferritin: 1263 ng/mL — ABNORMAL HIGH (ref 22–322)

## 2014-07-21 LAB — VITAMIN B12: Vitamin B-12: 256 pg/mL (ref 211–911)

## 2014-07-21 LAB — CREATININE, URINE, RANDOM: Creatinine, Urine: 43.6 mg/dL

## 2014-07-21 MED ORDER — ACETAMINOPHEN 325 MG PO TABS
650.0000 mg | ORAL_TABLET | Freq: Once | ORAL | Status: AC
  Administered 2014-07-21: 650 mg via ORAL
  Filled 2014-07-21: qty 2

## 2014-07-21 MED ORDER — CIPROFLOXACIN HCL 500 MG PO TABS: 500.0000 mg | ORAL_TABLET | Freq: Two times a day (BID) | ORAL | Status: DC

## 2014-07-21 NOTE — Progress Notes (Signed)
Patient's temp decreased to 98.4. Hospitalist on call made aware, and order received to begin transfusing 2nd unit of blood. Patient aware and agreeable to plan.

## 2014-07-21 NOTE — Discharge Summary (Signed)
Physician Discharge Summary  Nirvan Laban TIR:[id number] DOB: [birth year] DOA: 07/20/2014  PCP: [name], MD  Admit date: 07/20/2014 Discharge date: 07/21/2014  Recommendations for Outpatient Follow-up:  1. Pt insists on going home. He will follow up with Dr. [name] 4/4 and have rechecked blood work.  Discharge Diagnoses:  Principal Problem:   Fever and chills Active Problems:   Anemia, chronic disease   CKD (chronic kidney disease) stage 2, GFR 60-89 ml/min   Fever    Discharge Condition: stable   Diet recommendation: as tolerated   History of present illness:  49 y.o. male, with recently diagnosed history of multiple myeloma under the care of Dr. [name], CKD stage II, anemia of chronic disease received 2 units of packed RBC recently, started on chemotherapy with Velcade and Revlimid just recently. Pt presented to [facility] [place] ED because he spiked fever but otherwise he felt fine. In the ER his workup showed anemia, mild renal insufficiency, UA and chest x-ray were unremarkable.   Hospital Course:   Principal Problem:   Fever and leukopenia - no neutropenia - abx not given on admission - empiric cipro prescribed since pt did not want to stay so in case he spikes a fever prescription for cipro given.  Active Problems:   Anemia, chronic disease / thrombocytopenia  - Secondary to multiple myeloma - Hgb 6.6; he has gotten transfusion on admission - Did not check post transfusion Hgb since pt adamant about going home  - Platelet cont stable, 71    CKD (chronic kidney disease) stage 2, GFR 60-89 ml/min - Creatinine 1.66 - no further blood work since pt insists on going home - will be checked outpt    Hyponatremia - Pt did not want to get blood work done so we did not recheck BMP - It will be done outpt   Signed:  Leisa Lenz, MD  [facility] 07/21/2014, 8:28 AM  Pager #: [phone]  Discharge Exam: Filed Vitals:   07/21/14 0439  BP: 134/68  Pulse: 83   Temp: 99.9 F (37.7 C)  Resp: 18   Filed Vitals:   07/20/14 2235 07/21/14 0223 07/21/14 0402 07/21/14 0439  BP: 127/63 139/71 136/71 134/68  Pulse: 85 95 56 83  Temp: 99.5 F (37.5 C) 102.7 F (39.3 C) 98.4 F (36.9 C) 99.9 F (37.7 C)  TempSrc: Oral Oral Oral Oral  Resp: _0 Height:      Weight:      SpO2: 99% 100% 100% 97%    General: Pt is alert, follows commands appropriately, not in acute distress Cardiovascular: Regular rate and rhythm, S1/S2 +, no murmurs Respiratory: Clear to auscultation bilaterally, no wheezing, no crackles, no rhonchi Abdominal: Soft, non tender, non distended, bowel sounds +, no guarding Extremities: no edema, no cyanosis, pulses palpable bilaterally DP and PT Neuro: Grossly nonfocal  Discharge Instructions  Discharge Instructions    Call MD for:  difficulty breathing, headache or visual disturbances    Complete by:  As directed      Call MD for:  persistant nausea and vomiting    Complete by:  As directed      Call MD for:  severe uncontrolled pain    Complete by:  As directed      Diet - low sodium heart healthy    Complete by:  As directed      Discharge instructions    Complete by:  As directed   1. If you spike a fever please take  Cipro 500 mg twice a day for 5 days. 2. Hold off on taking Revlimid until you talk toDr. [name] when is it safe to resume. 3. Unit number is [phone] for any questions after your hospital stay. (hospitalist involved in your care - Dr. Leisa Lenz)     Increase activity slowly    Complete by:  As directed             Medication List    TAKE these medications        aspirin 325 MG EC tablet  Take 325 mg by mouth daily.     ciprofloxacin 500 MG tablet  Commonly known as:  CIPRO  Take 1 tablet (500 mg total) by mouth 2 (two) times daily.     dexamethasone 4 MG tablet  Commonly known as:  DECADRON  Take 5 pills at one time once a week with food for 3 weeks then 1 week off      famciclovir 500 MG tablet  Commonly known as:  FAMVIR  Take 1 tablet (500 mg total) by mouth daily.     lenalidomide 25 MG capsule  Commonly known as:  REVLIMID  Take 1 capsule (25 mg total) by mouth daily. Take for 21 days and 7 days off.     LORazepam 0.5 MG tablet  Commonly known as:  ATIVAN  Take 1 tablet (0.5 mg total) by mouth every 6 (six) hours as needed (Nausea or vomiting).     ondansetron 8 MG tablet  Commonly known as:  ZOFRAN  Take 1 tablet (8 mg total) by mouth 2 (two) times daily. Start the day after chemo for 2 days. Then take as needed for nausea or vomiting.     prochlorperazine 10 MG tablet  Commonly known as:  COMPAZINE  Take 1 tablet (10 mg total) by mouth every 6 (six) hours as needed (Nausea or vomiting).           Follow-up Information    Follow up with [name], MD. Schedule an appointment as soon as possible for a visit in 2 weeks.   Specialty:  Family Medicine   Why:  Follow up appt after recent hospitalization   Contact information:   [address] [place] [address] [phone]       Follow up with [name], MD.   Specialty:  Oncology   Contact information:   [place], [address] [place] [address] [phone]        The results of significant diagnostics from this hospitalization (including imaging, microbiology, ancillary and laboratory) are listed below for reference.    Significant Diagnostic Studies: Dg Chest 2 View  07/20/2014   CLINICAL DATA:  Fever for 1 day.  Multiple myeloma  EXAM: CHEST  2 VIEW  COMPARISON:  None.  FINDINGS: The heart size and mediastinal contours are within normal limits. Both lungs are clear. The visualized skeletal structures are unremarkable.  IMPRESSION: No active cardiopulmonary disease.   Electronically Signed   By: Earle Gell M.D.   On: 07/20/2014 18:40   Nm Pet Image Initial (pi) Whole Body  07/10/2014   CLINICAL DATA:  Initial treatment strategy for multiple  myeloma.  EXAM: NUCLEAR MEDICINE PET WHOLE BODY  TECHNIQUE: 10.1 mCi F-18 FDG was injected intravenously. Full-ring PET imaging was performed from the vertex to the feet after the radiotracer. CT data was obtained and used for attenuation correction and anatomic localization.  FASTING BLOOD GLUCOSE:  Value:  94 mg/dl  COMPARISON:  Bone survey 07/05/2014  FINDINGS: Head/Neck: No hypermetabolic lymph nodes in the neck.  Chest: No hypermetabolic mediastinal or hilar nodes. No suspicious pulmonary nodules on the CT scan.  Abdomen/Pelvis: The spleen is enlarged with a calculated volume of 687 cubic cm. The spleen is mildly hypermetabolic with metabolic activity mildly above the liver activity. There are no hypermetabolic abdominal pelvic lymph nodes.  There is an inflamed diverticulum along the proximal sigmoid colon with pericolonic stranding along the anti mesenteric border (image [id number]). There is no evidence of macro perforation or abscess.  Skeleton: There is dramatic uniform hypermetabolic marrow activity involving the proximal long bones, pelvic bones, and the spinal vertebral bodies. This matches the marrow pattern.  Extremities: No hypermetabolic activity to suggest metastasis.  IMPRESSION: 1. The most dramatic finding is intense uniform metabolic activity throughout the bone marrow suggestsing marrow hyperplasia. Cannot exclude marrow malignancy. 2. Moderate increase in splenic volume and metabolic activity. 3. No  skeletal lesions by CT. 4. Mild-to-moderate non complicated acute diverticulitis of the descending proximal sigmoid colon. Findings conveyed toPETER ENNEVER on 07/10/2014  at15:00.   Electronically Signed   By: Suzy Bouchard M.D.   On: 07/10/2014 15:06   Dg Bone Survey Met  07/05/2014   CLINICAL DATA:  Acute leukemia, staging of myeloma.  No complaints  EXAM: METASTATIC BONE SURVEY  COMPARISON:  None.  FINDINGS: The chest film reveals the lungs to be adequately inflated and clear. The heart and  pulmonary vascularity are normal. There is no pleural effusion. The ribs exhibit no lytic or blastic lesions.  The skull and spine exhibit no lytic or blastic lesions. There is mild degenerative change of the lower lumbar facet joints.  The pectoral girdle exhibits no lytic or blastic lesion nor other acute bony abnormality.  The bony pelvis is adequately mineralized with no lytic or blastic lesion. The hip joints are unremarkable. There is a well corticated lucency in the left femoral neck. The shafts of the femurs and tibias and fibulas are unremarkable.  IMPRESSION: There are no findings correction there are no lytic or blastic bony lesions. No findings are demonstrated to suggest metastatic disease. A sclerotic marginated lucency in the left femoral neck is most compatible with a cyst.   Electronically Signed   By: David  Martinique   On: 07/05/2014 16:13    Microbiology: No results found for this or any previous visit (from the past 240 hour(s)).   Labs: Basic Metabolic Panel:  Recent Labs Lab 07/20/14 1757  NA 129*  K 3.8  CL 103  CO2 21  GLUCOSE 87  BUN 30*  CREATININE 1.66*  CALCIUM 7.9*   Liver Function Tests:  Recent Labs Lab 07/20/14 1757  AST 38*  ALT 73*  ALKPHOS 40  BILITOT 1.2  PROT 10.6*  ALBUMIN 3.2*   No results for input(s): LIPASE, AMYLASE in the last 168 hours. No results for input(s): AMMONIA in the last 168 hours. CBC:  Recent Labs Lab 07/15/14 1206 07/20/14 1757  WBC 3.3* 3.6*  NEUTROABS 1.4* 2.7  HGB 6.9* 6.6*  HCT 20.9* 20.3*  MCV 103* 101.0*  PLT 78* 71*   Cardiac Enzymes: No results for input(s): CKTOTAL, CKMB, CKMBINDEX, TROPONINI in the last 168 hours. BNP: BNP (last 3 results) No results for input(s): BNP in the last 8760 hours.  ProBNP (last 3 results) No results for input(s): PROBNP in the last 8760 hours.  CBG: No results for input(s): GLUCAP in the last 168  hours.  Time coordinating discharge: Over 30 minutes

## 2014-07-21 NOTE — Progress Notes (Signed)
Patient with a temp of 102.7 after first unit of blood given. Hospitalist on call made aware, and orders received to give tylenol and recheck temperature in one hour.

## 2014-07-21 NOTE — Discharge Instructions (Signed)

## 2014-07-21 NOTE — Progress Notes (Signed)
Discharge instructions explained to pt, script given for Cipro. Pt states he will be seeing Dr. [name] 4/4. Wife will take pt home.

## 2014-07-22 LAB — TYPE AND SCREEN
ABO/RH(D): O POS
Antibody Screen: NEGATIVE
Unit division: 0
Unit division: 0

## 2014-07-23 ENCOUNTER — Ambulatory Visit: Payer: BLUE CROSS/BLUE SHIELD

## 2014-07-23 ENCOUNTER — Other Ambulatory Visit: Payer: BLUE CROSS/BLUE SHIELD

## 2014-07-25 ENCOUNTER — Ambulatory Visit (HOSPITAL_BASED_OUTPATIENT_CLINIC_OR_DEPARTMENT_OTHER): Payer: BLUE CROSS/BLUE SHIELD

## 2014-07-25 ENCOUNTER — Encounter: Payer: Self-pay | Admitting: Hematology & Oncology

## 2014-07-25 ENCOUNTER — Other Ambulatory Visit (HOSPITAL_BASED_OUTPATIENT_CLINIC_OR_DEPARTMENT_OTHER): Payer: BLUE CROSS/BLUE SHIELD

## 2014-07-25 DIAGNOSIS — Z5112 Encounter for antineoplastic immunotherapy: Secondary | ICD-10-CM | POA: Diagnosis not present

## 2014-07-25 DIAGNOSIS — C9000 Multiple myeloma not having achieved remission: Secondary | ICD-10-CM

## 2014-07-25 LAB — CBC WITH DIFFERENTIAL (CANCER CENTER ONLY)
BASO#: 0 10*3/uL (ref 0.0–0.2)
BASO%: 0.4 % (ref 0.0–2.0)
EOS%: 2.2 % (ref 0.0–7.0)
Eosinophils Absolute: 0.1 10*3/uL (ref 0.0–0.5)
HCT: 22.7 % — ABNORMAL LOW (ref 38.7–49.9)
HGB: 7.5 g/dL — ABNORMAL LOW (ref 13.0–17.1)
LYMPH#: 0.9 10*3/uL (ref 0.9–3.3)
LYMPH%: 33.1 % (ref 14.0–48.0)
MCH: 33.2 pg (ref 28.0–33.4)
MCHC: 33 g/dL (ref 32.0–35.9)
MCV: 100 fL — ABNORMAL HIGH (ref 82–98)
MONO#: 0.2 10*3/uL (ref 0.1–0.9)
MONO%: 6.3 % (ref 0.0–13.0)
NEUT#: 1.6 10*3/uL (ref 1.5–6.5)
NEUT%: 58 % (ref 40.0–80.0)
Platelets: 79 10*3/uL — ABNORMAL LOW (ref 145–400)
RBC: 2.26 10*6/uL — ABNORMAL LOW (ref 4.20–5.70)
RDW: 16.6 % — ABNORMAL HIGH (ref 11.1–15.7)
WBC: 2.7 10*3/uL — ABNORMAL LOW (ref 4.0–10.0)

## 2014-07-25 LAB — CMP (CANCER CENTER ONLY)
ALT(SGPT): 71 U/L — ABNORMAL HIGH (ref 10–47)
AST: 38 U/L (ref 11–38)
Albumin: 3.3 g/dL (ref 3.3–5.5)
Alkaline Phosphatase: 43 U/L (ref 26–84)
BUN, Bld: 15 mg/dL (ref 7–22)
CO2: 21 mEq/L (ref 18–33)
Calcium: 6.7 mg/dL — ABNORMAL LOW (ref 8.0–10.3)
Chloride: 106 mEq/L (ref 98–108)
Creat: 1 mg/dl (ref 0.6–1.2)
Glucose, Bld: 100 mg/dL (ref 73–118)
Potassium: 4.5 mEq/L (ref 3.3–4.7)
Sodium: 135 mEq/L (ref 128–145)
Total Bilirubin: 1 mg/dl (ref 0.20–1.60)
Total Protein: 10.6 g/dL — ABNORMAL HIGH (ref 6.4–8.1)

## 2014-07-25 MED ORDER — BORTEZOMIB CHEMO SQ INJECTION 3.5 MG (2.5MG/ML)
1.3000 mg/m2 | Freq: Once | INTRAMUSCULAR | Status: AC
  Administered 2014-07-25: 2.75 mg via SUBCUTANEOUS
  Filled 2014-07-25: qty 2.75

## 2014-07-25 MED ORDER — ONDANSETRON HCL 8 MG PO TABS: 8.0000 mg | ORAL_TABLET | Freq: Once | ORAL | Status: DC

## 2014-07-25 MED ORDER — ONDANSETRON HCL 8 MG PO TABS
ORAL_TABLET | ORAL | Status: AC
  Filled 2014-07-25: qty 1

## 2014-07-25 NOTE — Patient Instructions (Signed)
Bortezomib injection What is this medicine? BORTEZOMIB (bor TEZ oh mib) is a chemotherapy drug. It slows the growth of cancer cells. This medicine is used to treat multiple myeloma, and certain lymphomas, such as mantle-cell lymphoma. This medicine may be used for other purposes; ask your health care provider or pharmacist if you have questions. COMMON BRAND NAME(S): Velcade What should I tell my health care provider before I take this medicine? They need to know if you have any of these conditions: -diabetes -heart disease -irregular heartbeat -liver disease -on hemodialysis -low blood counts, like low white blood cells, platelets, or hemoglobin -peripheral neuropathy -taking medicine for blood pressure -an unusual or allergic reaction to bortezomib, mannitol, boron, other medicines, foods, dyes, or preservatives -pregnant or trying to get pregnant -breast-feeding How should I use this medicine? This medicine is for injection into a vein or for injection under the skin. It is given by a health care professional in a hospital or clinic setting. Talk to your pediatrician regarding the use of this medicine in children. Special care may be needed. Overdosage: If you think you have taken too much of this medicine contact a poison control center or emergency room at once. NOTE: This medicine is only for you. Do not share this medicine with others. What if I miss a dose? It is important not to miss your dose. Call your doctor or health care professional if you are unable to keep an appointment. What may interact with this medicine? This medicine may interact with the following medications: -ketoconazole -rifampin -ritonavir -St. John's Wort This list may not describe all possible interactions. Give your health care provider a list of all the medicines, herbs, non-prescription drugs, or dietary supplements you use. Also tell them if you smoke, drink alcohol, or use illegal drugs. Some items  may interact with your medicine. What should I watch for while using this medicine? Visit your doctor for checks on your progress. This drug may make you feel generally unwell. This is not uncommon, as chemotherapy can affect healthy cells as well as cancer cells. Report any side effects. Continue your course of treatment even though you feel ill unless your doctor tells you to stop. You may get drowsy or dizzy. Do not drive, use machinery, or do anything that needs mental alertness until you know how this medicine affects you. Do not stand or sit up quickly, especially if you are an older patient. This reduces the risk of dizzy or fainting spells. In some cases, you may be given additional medicines to help with side effects. Follow all directions for their use. Call your doctor or health care professional for advice if you get a fever, chills or sore throat, or other symptoms of a cold or flu. Do not treat yourself. This drug decreases your body's ability to fight infections. Try to avoid being around people who are sick. This medicine may increase your risk to bruise or bleed. Call your doctor or health care professional if you notice any unusual bleeding. You may need blood work done while you are taking this medicine. In some patients, this medicine may cause a serious brain infection that may cause death. If you have any problems seeing, thinking, speaking, walking, or standing, tell your doctor right away. If you cannot reach your doctor, urgently seek other source of medical care. Do not become pregnant while taking this medicine. Women should inform their doctor if they wish to become pregnant or think they might be pregnant. There is  a potential for serious side effects to an unborn child. Talk to your health care professional or pharmacist for more information. Do not breast-feed an infant while taking this medicine. Check with your doctor or health care professional if you get an attack of  severe diarrhea, nausea and vomiting, or if you sweat a lot. The loss of too much body fluid can make it dangerous for you to take this medicine. What side effects may I notice from receiving this medicine? Side effects that you should report to your doctor or health care professional as soon as possible: -allergic reactions like skin rash, itching or hives, swelling of the face, lips, or tongue -breathing problems -changes in hearing -changes in vision -fast, irregular heartbeat -feeling faint or lightheaded, falls -pain, tingling, numbness in the hands or feet -right upper belly pain -seizures -swelling of the ankles, feet, hands -unusual bleeding or bruising -unusually weak or tired -vomiting -yellowing of the eyes or skin Side effects that usually do not require medical attention (report to your doctor or health care professional if they continue or are bothersome): -changes in emotions or moods -constipation -diarrhea -loss of appetite -headache -irritation at site where injected -nausea This list may not describe all possible side effects. Call your doctor for medical advice about side effects. You may report side effects to FDA at 1-800-FDA-1088. Where should I keep my medicine? This drug is given in a hospital or clinic and will not be stored at home. NOTE: This sheet is a summary. It may not cover all possible information. If you have questions about this medicine, talk to your doctor, pharmacist, or health care provider.  2015, Elsevier/Gold Standard. (2013-01-29 12:46:32)

## 2014-07-26 ENCOUNTER — Other Ambulatory Visit: Payer: Self-pay | Admitting: *Deleted

## 2014-07-26 ENCOUNTER — Telehealth: Payer: Self-pay | Admitting: Hematology & Oncology

## 2014-07-26 DIAGNOSIS — C9000 Multiple myeloma not having achieved remission: Secondary | ICD-10-CM

## 2014-07-26 LAB — URINE CULTURE: Culture: NO GROWTH

## 2014-07-26 NOTE — Telephone Encounter (Signed)
Pt left message wanting lab today. Per RN I called pt back and he is aware they want hime to come in Monday. I offered several times to let him speak to RN but he declinded

## 2014-07-27 LAB — CULTURE, BLOOD (ROUTINE X 2): Culture: NO GROWTH

## 2014-07-29 ENCOUNTER — Other Ambulatory Visit: Payer: Self-pay

## 2014-07-29 ENCOUNTER — Other Ambulatory Visit (HOSPITAL_BASED_OUTPATIENT_CLINIC_OR_DEPARTMENT_OTHER): Payer: BLUE CROSS/BLUE SHIELD

## 2014-07-29 ENCOUNTER — Ambulatory Visit (HOSPITAL_COMMUNITY)
Admission: RE | Admit: 2014-07-29 | Discharge: 2014-07-29 | Disposition: A | Discharge status: Home / Self Care | Payer: BLUE CROSS/BLUE SHIELD | Source: Ambulatory Visit | Attending: Hematology & Oncology | Admitting: Hematology & Oncology

## 2014-07-29 DIAGNOSIS — C9000 Multiple myeloma not having achieved remission: Secondary | ICD-10-CM

## 2014-07-29 LAB — HOLD TUBE, BLOOD BANK - CHCC SATELLITE

## 2014-07-29 LAB — CBC WITH DIFFERENTIAL (CANCER CENTER ONLY)
BASO#: 0 10*3/uL (ref 0.0–0.2)
BASO%: 0.3 % (ref 0.0–2.0)
EOS%: 1.5 % (ref 0.0–7.0)
Eosinophils Absolute: 0.1 10*3/uL (ref 0.0–0.5)
HCT: 20.8 % — ABNORMAL LOW (ref 38.7–49.9)
HGB: 7 g/dL — ABNORMAL LOW (ref 13.0–17.1)
LYMPH#: 0.8 10*3/uL — ABNORMAL LOW (ref 0.9–3.3)
LYMPH%: 23.7 % (ref 14.0–48.0)
MCH: 33.5 pg — ABNORMAL HIGH (ref 28.0–33.4)
MCHC: 33.7 g/dL (ref 32.0–35.9)
MCV: 100 fL — ABNORMAL HIGH (ref 82–98)
MONO#: 0.4 10*3/uL (ref 0.1–0.9)
MONO%: 11.1 % (ref 0.0–13.0)
NEUT#: 2.1 10*3/uL (ref 1.5–6.5)
NEUT%: 63.4 % (ref 40.0–80.0)
Platelets: 88 10*3/uL — ABNORMAL LOW (ref 145–400)
RBC: 2.09 10*6/uL — ABNORMAL LOW (ref 4.20–5.70)
RDW: 16.5 % — ABNORMAL HIGH (ref 11.1–15.7)
WBC: 3.3 10*3/uL — ABNORMAL LOW (ref 4.0–10.0)

## 2014-07-30 ENCOUNTER — Ambulatory Visit: Payer: BLUE CROSS/BLUE SHIELD

## 2014-07-30 ENCOUNTER — Other Ambulatory Visit: Payer: BLUE CROSS/BLUE SHIELD

## 2014-07-30 ENCOUNTER — Encounter: Payer: Self-pay | Admitting: *Deleted

## 2014-07-31 ENCOUNTER — Ambulatory Visit (HOSPITAL_BASED_OUTPATIENT_CLINIC_OR_DEPARTMENT_OTHER): Payer: BLUE CROSS/BLUE SHIELD

## 2014-07-31 VITALS — BP 108/60 | HR 67 | Temp 98.2°F | Resp 18

## 2014-07-31 DIAGNOSIS — Z5112 Encounter for antineoplastic immunotherapy: Secondary | ICD-10-CM

## 2014-07-31 DIAGNOSIS — D649 Anemia, unspecified: Secondary | ICD-10-CM

## 2014-07-31 DIAGNOSIS — C9000 Multiple myeloma not having achieved remission: Secondary | ICD-10-CM

## 2014-07-31 LAB — ERYTHROPOIETIN: Erythropoietin: 167.8 m[IU]/mL — ABNORMAL HIGH (ref 2.6–18.5)

## 2014-07-31 MED ORDER — ONDANSETRON HCL 8 MG PO TABS
ORAL_TABLET | ORAL | Status: AC
  Filled 2014-07-31: qty 1

## 2014-07-31 MED ORDER — ACETAMINOPHEN 325 MG PO TABS
650.0000 mg | ORAL_TABLET | Freq: Once | ORAL | Status: AC
  Administered 2014-07-31: 650 mg via ORAL

## 2014-07-31 MED ORDER — SODIUM CHLORIDE 0.9 % IV SOLN
250.0000 mL | Freq: Once | INTRAVENOUS | Status: AC
  Administered 2014-07-31: 250 mL via INTRAVENOUS

## 2014-07-31 MED ORDER — BORTEZOMIB CHEMO SQ INJECTION 3.5 MG (2.5MG/ML)
1.3000 mg/m2 | Freq: Once | INTRAMUSCULAR | Status: AC
  Administered 2014-07-31: 2.75 mg via SUBCUTANEOUS
  Filled 2014-07-31: qty 2.75

## 2014-07-31 MED ORDER — DIPHENHYDRAMINE HCL 25 MG PO CAPS
25.0000 mg | ORAL_CAPSULE | Freq: Once | ORAL | Status: AC
  Administered 2014-07-31: 25 mg via ORAL

## 2014-07-31 MED ORDER — ONDANSETRON HCL 8 MG PO TABS
8.0000 mg | ORAL_TABLET | Freq: Once | ORAL | Status: AC
  Administered 2014-07-31: 8 mg via ORAL

## 2014-07-31 MED ORDER — FUROSEMIDE 10 MG/ML IJ SOLN
20.0000 mg | Freq: Once | INTRAMUSCULAR | Status: AC
  Administered 2014-07-31: 20 mg via INTRAVENOUS

## 2014-07-31 MED ORDER — ACETAMINOPHEN 325 MG PO TABS
ORAL_TABLET | ORAL | Status: AC
  Filled 2014-07-31: qty 2

## 2014-07-31 MED ORDER — FUROSEMIDE 10 MG/ML IJ SOLN
INTRAMUSCULAR | Status: AC
  Filled 2014-07-31: qty 4

## 2014-07-31 MED ORDER — DIPHENHYDRAMINE HCL 25 MG PO CAPS
ORAL_CAPSULE | ORAL | Status: AC
  Filled 2014-07-31: qty 1

## 2014-07-31 NOTE — Patient Instructions (Signed)

## 2014-08-01 ENCOUNTER — Other Ambulatory Visit: Payer: BLUE CROSS/BLUE SHIELD

## 2014-08-01 ENCOUNTER — Ambulatory Visit: Payer: BLUE CROSS/BLUE SHIELD

## 2014-08-01 ENCOUNTER — Encounter: Payer: Self-pay | Admitting: Hematology & Oncology

## 2014-08-01 LAB — TYPE AND SCREEN
ABO/RH(D): O POS
Antibody Screen: NEGATIVE
Unit division: 0
Unit division: 0

## 2014-08-02 ENCOUNTER — Other Ambulatory Visit: Payer: Self-pay | Admitting: Nurse Practitioner

## 2014-08-02 DIAGNOSIS — C9000 Multiple myeloma not having achieved remission: Secondary | ICD-10-CM

## 2014-08-05 ENCOUNTER — Other Ambulatory Visit (HOSPITAL_BASED_OUTPATIENT_CLINIC_OR_DEPARTMENT_OTHER): Payer: BLUE CROSS/BLUE SHIELD

## 2014-08-05 DIAGNOSIS — D649 Anemia, unspecified: Secondary | ICD-10-CM

## 2014-08-05 DIAGNOSIS — C9000 Multiple myeloma not having achieved remission: Secondary | ICD-10-CM | POA: Diagnosis not present

## 2014-08-05 LAB — CBC WITH DIFFERENTIAL (CANCER CENTER ONLY)
BASO#: 0 10*3/uL (ref 0.0–0.2)
BASO%: 0 % (ref 0.0–2.0)
EOS%: 3.1 % (ref 0.0–7.0)
Eosinophils Absolute: 0.1 10*3/uL (ref 0.0–0.5)
HCT: 24.5 % — ABNORMAL LOW (ref 38.7–49.9)
HGB: 8 g/dL — ABNORMAL LOW (ref 13.0–17.1)
LYMPH#: 0.6 10*3/uL — ABNORMAL LOW (ref 0.9–3.3)
LYMPH%: 30.6 % (ref 14.0–48.0)
MCH: 32.5 pg (ref 28.0–33.4)
MCHC: 32.7 g/dL (ref 32.0–35.9)
MCV: 100 fL — ABNORMAL HIGH (ref 82–98)
MONO#: 0.2 10*3/uL (ref 0.1–0.9)
MONO%: 10.4 % (ref 0.0–13.0)
NEUT#: 1.1 10*3/uL — ABNORMAL LOW (ref 1.5–6.5)
NEUT%: 55.9 % (ref 40.0–80.0)
Platelets: 81 10*3/uL — ABNORMAL LOW (ref 145–400)
RBC: 2.46 10*6/uL — ABNORMAL LOW (ref 4.20–5.70)
RDW: 17.7 % — ABNORMAL HIGH (ref 11.1–15.7)
WBC: 1.9 10*3/uL — ABNORMAL LOW (ref 4.0–10.0)

## 2014-08-05 LAB — HOLD TUBE, BLOOD BANK - CHCC SATELLITE

## 2014-08-05 LAB — TECHNOLOGIST REVIEW CHCC SATELLITE

## 2014-08-06 ENCOUNTER — Telehealth: Payer: Self-pay | Admitting: Hematology & Oncology

## 2014-08-06 ENCOUNTER — Ambulatory Visit: Payer: BLUE CROSS/BLUE SHIELD | Admitting: Hematology & Oncology

## 2014-08-06 ENCOUNTER — Other Ambulatory Visit: Payer: BLUE CROSS/BLUE SHIELD

## 2014-08-06 NOTE — Telephone Encounter (Signed)
FMLA papers completed and pt will pick them up on next appt.  FMLA for [employer] P: [phone]      COPY SCANNED

## 2014-08-07 ENCOUNTER — Telehealth: Payer: Self-pay | Admitting: *Deleted

## 2014-08-07 ENCOUNTER — Other Ambulatory Visit: Payer: Self-pay | Admitting: *Deleted

## 2014-08-07 DIAGNOSIS — C9000 Multiple myeloma not having achieved remission: Secondary | ICD-10-CM

## 2014-08-07 MED ORDER — LENALIDOMIDE 25 MG PO CAPS: 25.0000 mg | ORAL_CAPSULE | Freq: Every day | ORAL | Status: DC

## 2014-08-07 NOTE — Telephone Encounter (Signed)
Patient stating he has sternal pain. It doesn't feel muscular. He says it feel more like a 'bruise or fracture'. Dr [name] notified and order for chest x-ray obtained. Patient also wants to come in for lab work on Friday. Dr [name] is okay with this. Patient aware of plans. Will come in Friday and get chest x-ray when he can.

## 2014-08-08 ENCOUNTER — Ambulatory Visit (HOSPITAL_BASED_OUTPATIENT_CLINIC_OR_DEPARTMENT_OTHER)
Admission: RE | Admit: 2014-08-08 | Discharge: 2014-08-08 | Disposition: A | Discharge status: Home / Self Care | Payer: BLUE CROSS/BLUE SHIELD | Source: Ambulatory Visit | Attending: Hematology & Oncology | Admitting: Hematology & Oncology

## 2014-08-08 DIAGNOSIS — R0789 Other chest pain: Secondary | ICD-10-CM | POA: Insufficient documentation

## 2014-08-08 DIAGNOSIS — C9000 Multiple myeloma not having achieved remission: Secondary | ICD-10-CM | POA: Diagnosis not present

## 2014-08-08 LAB — CULTURE, BLOOD (ROUTINE X 2)

## 2014-08-09 ENCOUNTER — Other Ambulatory Visit: Payer: Self-pay | Admitting: *Deleted

## 2014-08-09 ENCOUNTER — Telehealth: Payer: Self-pay | Admitting: *Deleted

## 2014-08-09 ENCOUNTER — Ambulatory Visit (HOSPITAL_BASED_OUTPATIENT_CLINIC_OR_DEPARTMENT_OTHER): Payer: BLUE CROSS/BLUE SHIELD | Admitting: Nurse Practitioner

## 2014-08-09 DIAGNOSIS — C9000 Multiple myeloma not having achieved remission: Secondary | ICD-10-CM | POA: Diagnosis not present

## 2014-08-09 LAB — CBC WITH DIFFERENTIAL (CANCER CENTER ONLY)
BASO#: 0 10*3/uL (ref 0.0–0.2)
BASO%: 0 % (ref 0.0–2.0)
EOS%: 4.6 % (ref 0.0–7.0)
Eosinophils Absolute: 0.1 10*3/uL (ref 0.0–0.5)
HCT: 23.7 % — ABNORMAL LOW (ref 38.7–49.9)
HGB: 7.7 g/dL — ABNORMAL LOW (ref 13.0–17.1)
LYMPH#: 0.9 10*3/uL (ref 0.9–3.3)
LYMPH%: 40.8 % (ref 14.0–48.0)
MCH: 32.2 pg (ref 28.0–33.4)
MCHC: 32.5 g/dL (ref 32.0–35.9)
MCV: 99 fL — ABNORMAL HIGH (ref 82–98)
MONO#: 0.2 10*3/uL (ref 0.1–0.9)
MONO%: 11 % (ref 0.0–13.0)
NEUT#: 1 10*3/uL — ABNORMAL LOW (ref 1.5–6.5)
NEUT%: 43.6 % (ref 40.0–80.0)
Platelets: 94 10*3/uL — ABNORMAL LOW (ref 145–400)
RBC: 2.39 10*6/uL — ABNORMAL LOW (ref 4.20–5.70)
RDW: 17.6 % — ABNORMAL HIGH (ref 11.1–15.7)
WBC: 2.2 10*3/uL — ABNORMAL LOW (ref 4.0–10.0)

## 2014-08-09 LAB — HOLD TUBE, BLOOD BANK - CHCC SATELLITE

## 2014-08-09 MED ORDER — LORAZEPAM 0.5 MG PO TABS: 0.5000 mg | ORAL_TABLET | Freq: Four times a day (QID) | ORAL | Status: DC | PRN

## 2014-08-09 MED ORDER — LACTULOSE 20 GM/30ML PO SOLN: 30.0000 mL | Freq: Four times a day (QID) | ORAL | Status: DC

## 2014-08-09 NOTE — Progress Notes (Signed)
0910 During venipuncture for lab draw, pt became unresponsive, pale, clammy, assisted to floor without incident. Pt became arouseable, Dr. [name] in attendance.VS taken, assisted to chair and reclined.  2712 Color improving, Sat 98, VSS. 0950 Placed in sitting position, VSS.  1030  BP 104/66, 67. OK with Dr. [name] to discharge pt. Assisted to lobby. No complaints of dizziness.  Instructed to sit down if symptoms appear. Verbalized understanding.

## 2014-08-09 NOTE — Telephone Encounter (Addendum)
Patient aware of results.   ----- Message from [name], MD sent at 08/08/2014  4:19 PM EDT ----- Please call until him that the sternum looks okay. No obvious myelomatous lesions are noted. Thanks

## 2014-08-12 ENCOUNTER — Other Ambulatory Visit: Payer: Self-pay | Admitting: Nurse Practitioner

## 2014-08-12 DIAGNOSIS — C9000 Multiple myeloma not having achieved remission: Secondary | ICD-10-CM

## 2014-08-13 ENCOUNTER — Other Ambulatory Visit (HOSPITAL_BASED_OUTPATIENT_CLINIC_OR_DEPARTMENT_OTHER): Payer: BLUE CROSS/BLUE SHIELD

## 2014-08-13 DIAGNOSIS — C9000 Multiple myeloma not having achieved remission: Secondary | ICD-10-CM | POA: Diagnosis not present

## 2014-08-13 LAB — CBC WITH DIFFERENTIAL (CANCER CENTER ONLY)
BASO#: 0 10*3/uL (ref 0.0–0.2)
BASO%: 0 % (ref 0.0–2.0)
EOS%: 0.7 % (ref 0.0–7.0)
Eosinophils Absolute: 0 10*3/uL (ref 0.0–0.5)
HCT: 22.9 % — ABNORMAL LOW (ref 38.7–49.9)
HGB: 7.6 g/dL — ABNORMAL LOW (ref 13.0–17.1)
LYMPH#: 1 10*3/uL (ref 0.9–3.3)
LYMPH%: 34.6 % (ref 14.0–48.0)
MCH: 33 pg (ref 28.0–33.4)
MCHC: 33.2 g/dL (ref 32.0–35.9)
MCV: 100 fL — ABNORMAL HIGH (ref 82–98)
MONO#: 0.2 10*3/uL (ref 0.1–0.9)
MONO%: 7.5 % (ref 0.0–13.0)
NEUT#: 1.7 10*3/uL (ref 1.5–6.5)
NEUT%: 57.2 % (ref 40.0–80.0)
Platelets: 133 10*3/uL — ABNORMAL LOW (ref 145–400)
RBC: 2.3 10*6/uL — ABNORMAL LOW (ref 4.20–5.70)
RDW: 18 % — ABNORMAL HIGH (ref 11.1–15.7)
WBC: 3 10*3/uL — ABNORMAL LOW (ref 4.0–10.0)

## 2014-08-13 LAB — HOLD TUBE, BLOOD BANK - CHCC SATELLITE

## 2014-08-14 ENCOUNTER — Telehealth: Payer: Self-pay | Admitting: Hematology & Oncology

## 2014-08-14 ENCOUNTER — Encounter: Payer: Self-pay | Admitting: Hematology & Oncology

## 2014-08-14 NOTE — Telephone Encounter (Signed)
Faxed medical records and claim forms to:  [employer] F: [phone]  Insured ID: [id number] Claim ID: [id number] Policy: [id number]     COPY SCANNED

## 2014-08-15 ENCOUNTER — Other Ambulatory Visit: Payer: Self-pay | Admitting: *Deleted

## 2014-08-15 DIAGNOSIS — C9000 Multiple myeloma not having achieved remission: Secondary | ICD-10-CM

## 2014-08-16 ENCOUNTER — Other Ambulatory Visit (HOSPITAL_BASED_OUTPATIENT_CLINIC_OR_DEPARTMENT_OTHER): Payer: BLUE CROSS/BLUE SHIELD

## 2014-08-16 ENCOUNTER — Encounter: Payer: Self-pay | Admitting: Hematology & Oncology

## 2014-08-16 ENCOUNTER — Ambulatory Visit (HOSPITAL_BASED_OUTPATIENT_CLINIC_OR_DEPARTMENT_OTHER): Payer: BLUE CROSS/BLUE SHIELD | Admitting: Hematology & Oncology

## 2014-08-16 ENCOUNTER — Ambulatory Visit (HOSPITAL_BASED_OUTPATIENT_CLINIC_OR_DEPARTMENT_OTHER): Payer: BLUE CROSS/BLUE SHIELD

## 2014-08-16 VITALS — BP 126/68 | HR 78 | Temp 97.9°F | Ht 72.0 in | Wt 198.0 lb

## 2014-08-16 DIAGNOSIS — Z5112 Encounter for antineoplastic immunotherapy: Secondary | ICD-10-CM

## 2014-08-16 DIAGNOSIS — C9000 Multiple myeloma not having achieved remission: Secondary | ICD-10-CM | POA: Diagnosis not present

## 2014-08-16 LAB — CMP (CANCER CENTER ONLY)
ALT(SGPT): 40 U/L (ref 10–47)
AST: 25 U/L (ref 11–38)
Albumin: 3.6 g/dL (ref 3.3–5.5)
Alkaline Phosphatase: 94 U/L — ABNORMAL HIGH (ref 26–84)
BUN, Bld: 10 mg/dL (ref 7–22)
CO2: 23 mEq/L (ref 18–33)
Calcium: 7.5 mg/dL — ABNORMAL LOW (ref 8.0–10.3)
Chloride: 106 mEq/L (ref 98–108)
Creat: 1.2 mg/dl (ref 0.6–1.2)
Glucose, Bld: 186 mg/dL — ABNORMAL HIGH (ref 73–118)
Potassium: 4.4 mEq/L (ref 3.3–4.7)
Sodium: 137 mEq/L (ref 128–145)
Total Bilirubin: 0.8 mg/dl (ref 0.20–1.60)
Total Protein: 9.1 g/dL — ABNORMAL HIGH (ref 6.4–8.1)

## 2014-08-16 LAB — CBC WITH DIFFERENTIAL (CANCER CENTER ONLY)
BASO#: 0 10*3/uL (ref 0.0–0.2)
BASO%: 0 % (ref 0.0–2.0)
EOS%: 0.4 % (ref 0.0–7.0)
Eosinophils Absolute: 0 10*3/uL (ref 0.0–0.5)
HCT: 24.4 % — ABNORMAL LOW (ref 38.7–49.9)
HGB: 8 g/dL — ABNORMAL LOW (ref 13.0–17.1)
LYMPH#: 0.5 10*3/uL — ABNORMAL LOW (ref 0.9–3.3)
LYMPH%: 20.5 % (ref 14.0–48.0)
MCH: 32.9 pg (ref 28.0–33.4)
MCHC: 32.8 g/dL (ref 32.0–35.9)
MCV: 100 fL — ABNORMAL HIGH (ref 82–98)
MONO#: 0.1 10*3/uL (ref 0.1–0.9)
MONO%: 1.9 % (ref 0.0–13.0)
NEUT#: 2 10*3/uL (ref 1.5–6.5)
NEUT%: 77.2 % (ref 40.0–80.0)
Platelets: 140 10*3/uL — ABNORMAL LOW (ref 145–400)
RBC: 2.43 10*6/uL — ABNORMAL LOW (ref 4.20–5.70)
RDW: 18.6 % — ABNORMAL HIGH (ref 11.1–15.7)
WBC: 2.6 10*3/uL — ABNORMAL LOW (ref 4.0–10.0)

## 2014-08-16 MED ORDER — ONDANSETRON HCL 8 MG PO TABS
8.0000 mg | ORAL_TABLET | Freq: Once | ORAL | Status: AC
  Administered 2014-08-16: 8 mg via ORAL

## 2014-08-16 MED ORDER — BORTEZOMIB CHEMO SQ INJECTION 3.5 MG (2.5MG/ML)
1.3000 mg/m2 | Freq: Once | INTRAMUSCULAR | Status: AC
  Administered 2014-08-16: 2.75 mg via SUBCUTANEOUS
  Filled 2014-08-16: qty 2.75

## 2014-08-16 MED ORDER — ONDANSETRON HCL 8 MG PO TABS
ORAL_TABLET | ORAL | Status: AC
Start: 2014-08-16 — End: 2014-08-16
  Filled 2014-08-16: qty 1

## 2014-08-16 NOTE — Progress Notes (Signed)
zometa held d/t low serum calcium per Dr Ginette Pitman. dph

## 2014-08-16 NOTE — Patient Instructions (Signed)
Bortezomib injection What is this medicine? BORTEZOMIB (bor TEZ oh mib) is a chemotherapy drug. It slows the growth of cancer cells. This medicine is used to treat multiple myeloma, and certain lymphomas, such as mantle-cell lymphoma. This medicine may be used for other purposes; ask your health care provider or pharmacist if you have questions. COMMON BRAND NAME(S): Velcade What should I tell my health care provider before I take this medicine? They need to know if you have any of these conditions: -diabetes -heart disease -irregular heartbeat -liver disease -on hemodialysis -low blood counts, like low white blood cells, platelets, or hemoglobin -peripheral neuropathy -taking medicine for blood pressure -an unusual or allergic reaction to bortezomib, mannitol, boron, other medicines, foods, dyes, or preservatives -pregnant or trying to get pregnant -breast-feeding How should I use this medicine? This medicine is for injection into a vein or for injection under the skin. It is given by a health care professional in a hospital or clinic setting. Talk to your pediatrician regarding the use of this medicine in children. Special care may be needed. Overdosage: If you think you have taken too much of this medicine contact a poison control center or emergency room at once. NOTE: This medicine is only for you. Do not share this medicine with others. What if I miss a dose? It is important not to miss your dose. Call your doctor or health care professional if you are unable to keep an appointment. What may interact with this medicine? This medicine may interact with the following medications: -ketoconazole -rifampin -ritonavir -St. John's Wort This list may not describe all possible interactions. Give your health care provider a list of all the medicines, herbs, non-prescription drugs, or dietary supplements you use. Also tell them if you smoke, drink alcohol, or use illegal drugs. Some items  may interact with your medicine. What should I watch for while using this medicine? Visit your doctor for checks on your progress. This drug may make you feel generally unwell. This is not uncommon, as chemotherapy can affect healthy cells as well as cancer cells. Report any side effects. Continue your course of treatment even though you feel ill unless your doctor tells you to stop. You may get drowsy or dizzy. Do not drive, use machinery, or do anything that needs mental alertness until you know how this medicine affects you. Do not stand or sit up quickly, especially if you are an older patient. This reduces the risk of dizzy or fainting spells. In some cases, you may be given additional medicines to help with side effects. Follow all directions for their use. Call your doctor or health care professional for advice if you get a fever, chills or sore throat, or other symptoms of a cold or flu. Do not treat yourself. This drug decreases your body's ability to fight infections. Try to avoid being around people who are sick. This medicine may increase your risk to bruise or bleed. Call your doctor or health care professional if you notice any unusual bleeding. You may need blood work done while you are taking this medicine. In some patients, this medicine may cause a serious brain infection that may cause death. If you have any problems seeing, thinking, speaking, walking, or standing, tell your doctor right away. If you cannot reach your doctor, urgently seek other source of medical care. Do not become pregnant while taking this medicine. Women should inform their doctor if they wish to become pregnant or think they might be pregnant. There is   a potential for serious side effects to an unborn child. Talk to your health care professional or pharmacist for more information. Do not breast-feed an infant while taking this medicine. Check with your doctor or health care professional if you get an attack of  severe diarrhea, nausea and vomiting, or if you sweat a lot. The loss of too much body fluid can make it dangerous for you to take this medicine. What side effects may I notice from receiving this medicine? Side effects that you should report to your doctor or health care professional as soon as possible: -allergic reactions like skin rash, itching or hives, swelling of the face, lips, or tongue -breathing problems -changes in hearing -changes in vision -fast, irregular heartbeat -feeling faint or lightheaded, falls -pain, tingling, numbness in the hands or feet -right upper belly pain -seizures -swelling of the ankles, feet, hands -unusual bleeding or bruising -unusually weak or tired -vomiting -yellowing of the eyes or skin Side effects that usually do not require medical attention (report to your doctor or health care professional if they continue or are bothersome): -changes in emotions or moods -constipation -diarrhea -loss of appetite -headache -irritation at site where injected -nausea This list may not describe all possible side effects. Call your doctor for medical advice about side effects. You may report side effects to FDA at 1-800-FDA-1088. Where should I keep my medicine? This drug is given in a hospital or clinic and will not be stored at home. NOTE: This sheet is a summary. It may not cover all possible information. If you have questions about this medicine, talk to your doctor, pharmacist, or health care provider.  2015, Elsevier/Gold Standard. (2013-01-29 12:46:32)  

## 2014-08-16 NOTE — Progress Notes (Signed)
Hematology and Oncology Follow Up Visit  Tyler Phillips [id number] [birth year] 48 y.o. 08/16/2014   Principle Diagnosis:   IgG Kappa myeloma  Current Therapy:    S/p Cycle #1 of RVD  Zometa 4 mg IV every month     Interim History:  Tyler Phillips is back for follow-up. He is doing better. He is responding to treatment. His plate count is starting to go back up. His hemoglobin is now stabilizing a little bit. We have not had to transfuse him I think for over a week or so.  His total protein is coming down. We first saw him, his total protein was 12.1. Is now 9.1.  He's tolerated treatment pretty well. He does have some chest tightness. We did go ahead and get a chest x-ray on him. This did not show any count of sternal fracture or abnormality. I would had to think that it probably is from his treatments are still from the myeloma.  He did have some constipation. He is on lactulose. He is now going to the bathroom.  He's had no bleeding.  He had a little bit of a fever after the Zometa. I think we can just slow the Zometa infusion down.  He is trying to exercise. I told him that this would be helpful. I think the more active that he is the better he will tolerate treatment.  Overall, his performance status is ECOG 1.    Medications:  Current outpatient prescriptions:  .  aspirin 325 MG EC tablet, Take 325 mg by mouth daily., Disp: , Rfl:  .  dexamethasone (DECADRON) 4 MG tablet, Take 5 pills at one time once a week with food for 3 weeks then 1 week off, Disp: 60 tablet, Rfl: 2 .  famciclovir (FAMVIR) 500 MG tablet, Take 1 tablet (500 mg total) by mouth daily., Disp: 30 tablet, Rfl: 6 .  Lactulose 20 GM/30ML SOLN, Take 30 mLs (20 g total) by mouth 4 (four) times daily., Disp: 1000 mL, Rfl: 5 .  lenalidomide (REVLIMID) 25 MG capsule, Take 1 capsule (25 mg total) by mouth daily. Take for 21 days and 7 days off. [id number], Disp: 21 capsule, Rfl: 0 .  LORazepam (ATIVAN) 0.5 MG tablet,  Take 1 tablet (0.5 mg total) by mouth every 6 (six) hours as needed (Nausea or vomiting)., Disp: 60 tablet, Rfl: 0 .  zolpidem (AMBIEN) 10 MG tablet, Take 10 mg by mouth at bedtime as needed. , Disp: , Rfl: 0 .  prochlorperazine (COMPAZINE) 10 MG tablet, Take 1 tablet (10 mg total) by mouth every 6 (six) hours as needed (Nausea or vomiting). (Patient not taking: Reported on 08/16/2014), Disp: 30 tablet, Rfl: 1  Allergies: No Known Allergies  Past Medical History, Surgical history, Social history, and Family History were reviewed and updated.  Review of Systems: As above  Physical Exam:  height is 6' (1.829 m) and weight is 198 lb (89.812 kg). His oral temperature is 97.9 F (36.6 C). His blood pressure is 126/68 and his pulse is 78.   Wt Readings from Last 3 Encounters:  08/16/14 198 lb (89.812 kg)  07/20/14 208 lb 1.8 oz (94.4 kg)  07/15/14 198 lb (89.812 kg)     Well-developed and well-nourished white chum in no obvious distress. Head and neck exam shows no ocular or oral lesions. There are no palpable cervical or supraclavicular lymph nodes. Lungs are clear. Cardiac exam regular rate and rhythm with no murmurs, rubs or bruits. Abdomen is soft. He  has good bowel sounds. There is no fluid wave. There is no palpable hepatomegaly. His spleen tip might be palpable at the left costal margin. Extremities shows no clubbing, cyanosis or edema. He may have some trace edema in his lower legs. Skin exam shows no rashes, ecchymoses or petechia. Neurological exam is nonfocal.  Lab Results  Component Value Date   WBC 2.6* 08/16/2014   HGB 8.0* 08/16/2014   HCT 24.4* 08/16/2014   MCV 100* 08/16/2014   PLT 140 Large platelets present* 08/16/2014     Chemistry      Component Value Date/Time   NA 137 08/16/2014 1208   NA 129* 07/20/2014 1757   K 4.4 08/16/2014 1208   K 3.8 07/20/2014 1757   CL 106 08/16/2014 1208   CL 103 07/20/2014 1757   CO2 23 08/16/2014 1208   CO2 21 07/20/2014 1757    BUN 10 08/16/2014 1208   BUN 30* 07/20/2014 1757   CREATININE 1.2 08/16/2014 1208   CREATININE 1.66* 07/20/2014 1757      Component Value Date/Time   CALCIUM 7.5* 08/16/2014 1208   CALCIUM 7.9* 07/20/2014 1757   ALKPHOS 94* 08/16/2014 1208   ALKPHOS 40 07/20/2014 1757   AST 25 08/16/2014 1208   AST 38* 07/20/2014 1757   ALT 40 08/16/2014 1208   ALT 73* 07/20/2014 1757   BILITOT 0.80 08/16/2014 1208   BILITOT 1.2 07/20/2014 1757         Impression and Plan: Tyler Phillips is 49 year old gentleman with IgG Kappa myeloma. He has extensive marrow involvement. His beta-2 microglobulin is almost 19. This by the highest that seen for a patient with myeloma.  Again, I think that he is responding. We will get his myeloma levels back next week and we will see what they look like.  I spent about 40 minutes with he and his wife. He had quite a few questions. I answered his questions.  I told him that I still felt that transplant was clearly in his best interest.  We will continue him on therapy. Hopefully, we will get him to the 4 cycles and then we will see how his levels look. I told him that the lowest reading get his myeloma with chemotherapy this would be beneficial with transplant.  I will plan to see him back myself in one month.    [name], MD 4/29/20161:27 PM

## 2014-08-23 ENCOUNTER — Other Ambulatory Visit: Payer: Self-pay | Admitting: *Deleted

## 2014-08-23 ENCOUNTER — Other Ambulatory Visit (HOSPITAL_BASED_OUTPATIENT_CLINIC_OR_DEPARTMENT_OTHER): Payer: BLUE CROSS/BLUE SHIELD

## 2014-08-23 ENCOUNTER — Ambulatory Visit (HOSPITAL_BASED_OUTPATIENT_CLINIC_OR_DEPARTMENT_OTHER): Payer: BLUE CROSS/BLUE SHIELD

## 2014-08-23 VITALS — BP 138/78 | HR 68 | Temp 97.8°F

## 2014-08-23 DIAGNOSIS — Z5112 Encounter for antineoplastic immunotherapy: Secondary | ICD-10-CM | POA: Diagnosis not present

## 2014-08-23 DIAGNOSIS — C9000 Multiple myeloma not having achieved remission: Secondary | ICD-10-CM

## 2014-08-23 LAB — CBC WITH DIFFERENTIAL (CANCER CENTER ONLY)
BASO#: 0 10*3/uL (ref 0.0–0.2)
BASO%: 0.3 % (ref 0.0–2.0)
EOS%: 4 % (ref 0.0–7.0)
Eosinophils Absolute: 0.1 10*3/uL (ref 0.0–0.5)
HCT: 28.2 % — ABNORMAL LOW (ref 38.7–49.9)
HGB: 9.3 g/dL — ABNORMAL LOW (ref 13.0–17.1)
LYMPH#: 0.9 10*3/uL (ref 0.9–3.3)
LYMPH%: 26.1 % (ref 14.0–48.0)
MCH: 33.1 pg (ref 28.0–33.4)
MCHC: 33 g/dL (ref 32.0–35.9)
MCV: 100 fL — ABNORMAL HIGH (ref 82–98)
MONO#: 0.2 10*3/uL (ref 0.1–0.9)
MONO%: 4.3 % (ref 0.0–13.0)
NEUT#: 2.3 10*3/uL (ref 1.5–6.5)
NEUT%: 65.3 % (ref 40.0–80.0)
Platelets: 122 10*3/uL — ABNORMAL LOW (ref 145–400)
RBC: 2.81 10*6/uL — ABNORMAL LOW (ref 4.20–5.70)
RDW: 17.6 % — ABNORMAL HIGH (ref 11.1–15.7)
WBC: 3.5 10*3/uL — ABNORMAL LOW (ref 4.0–10.0)

## 2014-08-23 LAB — HOLD TUBE, BLOOD BANK - CHCC SATELLITE

## 2014-08-23 MED ORDER — ONDANSETRON HCL 8 MG PO TABS
8.0000 mg | ORAL_TABLET | Freq: Once | ORAL | Status: AC
  Administered 2014-08-23: 8 mg via ORAL

## 2014-08-23 MED ORDER — BORTEZOMIB CHEMO SQ INJECTION 3.5 MG (2.5MG/ML)
1.3000 mg/m2 | Freq: Once | INTRAMUSCULAR | Status: AC
  Administered 2014-08-23: 2.75 mg via SUBCUTANEOUS
  Filled 2014-08-23: qty 2.75

## 2014-08-23 MED ORDER — SODIUM CHLORIDE 0.9 % IV SOLN: Freq: Once | INTRAVENOUS | Status: DC

## 2014-08-23 MED ORDER — ZOLEDRONIC ACID 4 MG/5ML IV CONC: 4.0000 mg | Freq: Once | INTRAVENOUS | Status: DC

## 2014-08-23 MED ORDER — ONDANSETRON HCL 8 MG PO TABS
ORAL_TABLET | ORAL | Status: AC
  Filled 2014-08-23: qty 1

## 2014-08-23 NOTE — Patient Instructions (Signed)
Bortezomib injection What is this medicine? BORTEZOMIB (bor TEZ oh mib) is a chemotherapy drug. It slows the growth of cancer cells. This medicine is used to treat multiple myeloma, and certain lymphomas, such as mantle-cell lymphoma. This medicine may be used for other purposes; ask your health care provider or pharmacist if you have questions. COMMON BRAND NAME(S): Velcade What should I tell my health care provider before I take this medicine? They need to know if you have any of these conditions: -diabetes -heart disease -irregular heartbeat -liver disease -on hemodialysis -low blood counts, like low white blood cells, platelets, or hemoglobin -peripheral neuropathy -taking medicine for blood pressure -an unusual or allergic reaction to bortezomib, mannitol, boron, other medicines, foods, dyes, or preservatives -pregnant or trying to get pregnant -breast-feeding How should I use this medicine? This medicine is for injection into a vein or for injection under the skin. It is given by a health care professional in a hospital or clinic setting. Talk to your pediatrician regarding the use of this medicine in children. Special care may be needed. Overdosage: If you think you have taken too much of this medicine contact a poison control center or emergency room at once. NOTE: This medicine is only for you. Do not share this medicine with others. What if I miss a dose? It is important not to miss your dose. Call your doctor or health care professional if you are unable to keep an appointment. What may interact with this medicine? This medicine may interact with the following medications: -ketoconazole -rifampin -ritonavir -St. John's Wort This list may not describe all possible interactions. Give your health care provider a list of all the medicines, herbs, non-prescription drugs, or dietary supplements you use. Also tell them if you smoke, drink alcohol, or use illegal drugs. Some items  may interact with your medicine. What should I watch for while using this medicine? Visit your doctor for checks on your progress. This drug may make you feel generally unwell. This is not uncommon, as chemotherapy can affect healthy cells as well as cancer cells. Report any side effects. Continue your course of treatment even though you feel ill unless your doctor tells you to stop. You may get drowsy or dizzy. Do not drive, use machinery, or do anything that needs mental alertness until you know how this medicine affects you. Do not stand or sit up quickly, especially if you are an older patient. This reduces the risk of dizzy or fainting spells. In some cases, you may be given additional medicines to help with side effects. Follow all directions for their use. Call your doctor or health care professional for advice if you get a fever, chills or sore throat, or other symptoms of a cold or flu. Do not treat yourself. This drug decreases your body's ability to fight infections. Try to avoid being around people who are sick. This medicine may increase your risk to bruise or bleed. Call your doctor or health care professional if you notice any unusual bleeding. You may need blood work done while you are taking this medicine. In some patients, this medicine may cause a serious brain infection that may cause death. If you have any problems seeing, thinking, speaking, walking, or standing, tell your doctor right away. If you cannot reach your doctor, urgently seek other source of medical care. Do not become pregnant while taking this medicine. Women should inform their doctor if they wish to become pregnant or think they might be pregnant. There is   a potential for serious side effects to an unborn child. Talk to your health care professional or pharmacist for more information. Do not breast-feed an infant while taking this medicine. Check with your doctor or health care professional if you get an attack of  severe diarrhea, nausea and vomiting, or if you sweat a lot. The loss of too much body fluid can make it dangerous for you to take this medicine. What side effects may I notice from receiving this medicine? Side effects that you should report to your doctor or health care professional as soon as possible: -allergic reactions like skin rash, itching or hives, swelling of the face, lips, or tongue -breathing problems -changes in hearing -changes in vision -fast, irregular heartbeat -feeling faint or lightheaded, falls -pain, tingling, numbness in the hands or feet -right upper belly pain -seizures -swelling of the ankles, feet, hands -unusual bleeding or bruising -unusually weak or tired -vomiting -yellowing of the eyes or skin Side effects that usually do not require medical attention (report to your doctor or health care professional if they continue or are bothersome): -changes in emotions or moods -constipation -diarrhea -loss of appetite -headache -irritation at site where injected -nausea This list may not describe all possible side effects. Call your doctor for medical advice about side effects. You may report side effects to FDA at 1-800-FDA-1088. Where should I keep my medicine? This drug is given in a hospital or clinic and will not be stored at home. NOTE: This sheet is a summary. It may not cover all possible information. If you have questions about this medicine, talk to your doctor, pharmacist, or health care provider.  2015, [employer]. (2013-01-29 12:46:32)

## 2014-08-30 ENCOUNTER — Other Ambulatory Visit (HOSPITAL_BASED_OUTPATIENT_CLINIC_OR_DEPARTMENT_OTHER): Payer: BLUE CROSS/BLUE SHIELD

## 2014-08-30 ENCOUNTER — Ambulatory Visit (HOSPITAL_BASED_OUTPATIENT_CLINIC_OR_DEPARTMENT_OTHER): Payer: BLUE CROSS/BLUE SHIELD

## 2014-08-30 VITALS — BP 139/66 | HR 73 | Temp 98.3°F | Resp 18

## 2014-08-30 DIAGNOSIS — C9000 Multiple myeloma not having achieved remission: Secondary | ICD-10-CM

## 2014-08-30 DIAGNOSIS — Z5112 Encounter for antineoplastic immunotherapy: Secondary | ICD-10-CM | POA: Diagnosis not present

## 2014-08-30 LAB — CBC WITH DIFFERENTIAL (CANCER CENTER ONLY)
BASO#: 0 10*3/uL (ref 0.0–0.2)
BASO%: 0 % (ref 0.0–2.0)
EOS%: 0.2 % (ref 0.0–7.0)
Eosinophils Absolute: 0 10*3/uL (ref 0.0–0.5)
HCT: 29.6 % — ABNORMAL LOW (ref 38.7–49.9)
HGB: 10 g/dL — ABNORMAL LOW (ref 13.0–17.1)
LYMPH#: 0.5 10*3/uL — ABNORMAL LOW (ref 0.9–3.3)
LYMPH%: 10.1 % — ABNORMAL LOW (ref 14.0–48.0)
MCH: 33.6 pg — ABNORMAL HIGH (ref 28.0–33.4)
MCHC: 33.8 g/dL (ref 32.0–35.9)
MCV: 99 fL — ABNORMAL HIGH (ref 82–98)
MONO#: 0.1 10*3/uL (ref 0.1–0.9)
MONO%: 1 % (ref 0.0–13.0)
NEUT#: 4.6 10*3/uL (ref 1.5–6.5)
NEUT%: 88.7 % — ABNORMAL HIGH (ref 40.0–80.0)
Platelets: 115 10*3/uL — ABNORMAL LOW (ref 145–400)
RBC: 2.98 10*6/uL — ABNORMAL LOW (ref 4.20–5.70)
RDW: 16.6 % — ABNORMAL HIGH (ref 11.1–15.7)
WBC: 5.2 10*3/uL (ref 4.0–10.0)

## 2014-08-30 LAB — CMP (CANCER CENTER ONLY)
ALT(SGPT): 43 U/L (ref 10–47)
AST: 23 U/L (ref 11–38)
Albumin: 3.8 g/dL (ref 3.3–5.5)
Alkaline Phosphatase: 75 U/L (ref 26–84)
BUN, Bld: 13 mg/dL (ref 7–22)
CO2: 25 mEq/L (ref 18–33)
Calcium: 8.6 mg/dL (ref 8.0–10.3)
Chloride: 108 mEq/L (ref 98–108)
Creat: 1.2 mg/dl (ref 0.6–1.2)
Glucose, Bld: 145 mg/dL — ABNORMAL HIGH (ref 73–118)
Potassium: 4.1 mEq/L (ref 3.3–4.7)
Sodium: 139 mEq/L (ref 128–145)
Total Bilirubin: 1.1 mg/dl (ref 0.20–1.60)
Total Protein: 8.4 g/dL — ABNORMAL HIGH (ref 6.4–8.1)

## 2014-08-30 MED ORDER — ZOLEDRONIC ACID 4 MG/100ML IV SOLN
4.0000 mg | Freq: Once | INTRAVENOUS | Status: AC
  Administered 2014-08-30: 4 mg via INTRAVENOUS
  Filled 2014-08-30: qty 100

## 2014-08-30 MED ORDER — BORTEZOMIB CHEMO SQ INJECTION 3.5 MG (2.5MG/ML)
1.3000 mg/m2 | Freq: Once | INTRAMUSCULAR | Status: AC
  Administered 2014-08-30: 2.75 mg via SUBCUTANEOUS
  Filled 2014-08-30: qty 2.75

## 2014-08-30 MED ORDER — ONDANSETRON HCL 8 MG PO TABS: 8.0000 mg | ORAL_TABLET | Freq: Once | ORAL | Status: DC

## 2014-08-30 MED ORDER — ZOLEDRONIC ACID 4 MG/100ML IV SOLN
4.0000 mg | Freq: Once | INTRAVENOUS | Status: DC
  Filled 2014-08-30: qty 100

## 2014-08-30 MED ORDER — SODIUM CHLORIDE 0.9 % IV SOLN
Freq: Once | INTRAVENOUS | Status: AC
  Administered 2014-08-30: 15:00:00 via INTRAVENOUS

## 2014-08-30 NOTE — Patient Instructions (Signed)

## 2014-09-04 ENCOUNTER — Telehealth: Payer: Self-pay

## 2014-09-04 NOTE — Telephone Encounter (Signed)
Received call from pt reporting he broke a tooth yesterday but remembered Dr Ginette Pitman advising him not to have dental work while on [place]. Pt received Zometa on 5/13.   Per Dr Ginette Pitman, ok to have tooth evaluated and proceed with either filling or crown. Pt to contact our office if plan is different that this, especially if extraction is required. Pt verbalizes understanding. dph

## 2014-09-05 ENCOUNTER — Other Ambulatory Visit: Payer: Self-pay | Admitting: *Deleted

## 2014-09-05 DIAGNOSIS — C9000 Multiple myeloma not having achieved remission: Secondary | ICD-10-CM

## 2014-09-05 MED ORDER — LENALIDOMIDE 25 MG PO CAPS: 25.0000 mg | ORAL_CAPSULE | Freq: Every day | ORAL | Status: DC

## 2014-09-12 ENCOUNTER — Ambulatory Visit (HOSPITAL_BASED_OUTPATIENT_CLINIC_OR_DEPARTMENT_OTHER): Payer: BLUE CROSS/BLUE SHIELD

## 2014-09-12 ENCOUNTER — Other Ambulatory Visit: Payer: Self-pay | Admitting: *Deleted

## 2014-09-12 ENCOUNTER — Ambulatory Visit (HOSPITAL_BASED_OUTPATIENT_CLINIC_OR_DEPARTMENT_OTHER): Payer: BLUE CROSS/BLUE SHIELD | Admitting: Hematology & Oncology

## 2014-09-12 ENCOUNTER — Other Ambulatory Visit (HOSPITAL_BASED_OUTPATIENT_CLINIC_OR_DEPARTMENT_OTHER): Payer: BLUE CROSS/BLUE SHIELD

## 2014-09-12 VITALS — BP 138/71 | HR 64 | Temp 97.7°F | Resp 16 | Wt 201.0 lb

## 2014-09-12 DIAGNOSIS — C9000 Multiple myeloma not having achieved remission: Secondary | ICD-10-CM

## 2014-09-12 DIAGNOSIS — Z5112 Encounter for antineoplastic immunotherapy: Secondary | ICD-10-CM

## 2014-09-12 LAB — LACTATE DEHYDROGENASE: LDH: 132 U/L (ref 94–250)

## 2014-09-12 LAB — CBC WITH DIFFERENTIAL (CANCER CENTER ONLY)
BASO#: 0 10*3/uL (ref 0.0–0.2)
BASO%: 1 % (ref 0.0–2.0)
EOS%: 2 % (ref 0.0–7.0)
Eosinophils Absolute: 0 10*3/uL (ref 0.0–0.5)
HCT: 29.8 % — ABNORMAL LOW (ref 38.7–49.9)
HGB: 10 g/dL — ABNORMAL LOW (ref 13.0–17.1)
LYMPH#: 0.7 10*3/uL — ABNORMAL LOW (ref 0.9–3.3)
LYMPH%: 34.3 % (ref 14.0–48.0)
MCH: 33.8 pg — ABNORMAL HIGH (ref 28.0–33.4)
MCHC: 33.6 g/dL (ref 32.0–35.9)
MCV: 101 fL — ABNORMAL HIGH (ref 82–98)
MONO#: 0.1 10*3/uL (ref 0.1–0.9)
MONO%: 6.5 % (ref 0.0–13.0)
NEUT#: 1.1 10*3/uL — ABNORMAL LOW (ref 1.5–6.5)
NEUT%: 56.2 % (ref 40.0–80.0)
Platelets: 149 10*3/uL (ref 145–400)
RBC: 2.96 10*6/uL — ABNORMAL LOW (ref 4.20–5.70)
RDW: 15.7 % (ref 11.1–15.7)
WBC: 2 10*3/uL — ABNORMAL LOW (ref 4.0–10.0)

## 2014-09-12 LAB — CMP (CANCER CENTER ONLY)
ALT(SGPT): 38 U/L (ref 10–47)
AST: 25 U/L (ref 11–38)
Albumin: 3.5 g/dL (ref 3.3–5.5)
Alkaline Phosphatase: 78 U/L (ref 26–84)
BUN, Bld: 13 mg/dL (ref 7–22)
CO2: 26 mEq/L (ref 18–33)
Calcium: 8.1 mg/dL (ref 8.0–10.3)
Chloride: 110 mEq/L — ABNORMAL HIGH (ref 98–108)
Creat: 1.1 mg/dl (ref 0.6–1.2)
Glucose, Bld: 92 mg/dL (ref 73–118)
Potassium: 4.1 mEq/L (ref 3.3–4.7)
Sodium: 139 mEq/L (ref 128–145)
Total Bilirubin: 0.7 mg/dl (ref 0.20–1.60)
Total Protein: 7.9 g/dL (ref 6.4–8.1)

## 2014-09-12 MED ORDER — BORTEZOMIB CHEMO SQ INJECTION 3.5 MG (2.5MG/ML)
1.3000 mg/m2 | Freq: Once | INTRAMUSCULAR | Status: AC
  Administered 2014-09-12: 2.75 mg via SUBCUTANEOUS
  Filled 2014-09-12: qty 2.75

## 2014-09-12 MED ORDER — ONDANSETRON HCL 8 MG PO TABS: 8.0000 mg | ORAL_TABLET | Freq: Once | ORAL | Status: DC

## 2014-09-12 NOTE — Progress Notes (Signed)
Hematology and Oncology Follow Up Visit  Tyler Phillips [id number] Sep 06, 1965 49 y.o. 09/12/2014   Principle Diagnosis:   IgG Kappa myeloma  Current Therapy:    S/p Cycle #2 of RVD  Zometa 4 mg IV every month     Interim History:  Tyler Phillips is back for follow-up. He i continues to do quite well. He is more active. He is doing more things around his property.  He is not hurting as much. His sternal pain is no longer bothering him.  We've not transfuse him now for about 2 months. This, to me, means that the treatments are working and that the myeloma is being cleaned out of his bone marrow.  He's had no fever. He's had no rashes.  He continues on with his aspirin.  He does get a little bit of constipation after treatment. Lactulose does seem to help this.  He's not noted any cough. He still has some shortness of breath.  We are checking his myeloma studies today. I would have to suspect that they are markedly better. Overall, his performance status is ECOG 1.  Medications:  Current outpatient prescriptions:  .  aspirin 325 MG EC tablet, Take 325 mg by mouth daily., Disp: , Rfl:  .  dexamethasone (DECADRON) 4 MG tablet, Take 5 pills at one time once a week with food for 3 weeks then 1 week off, Disp: 60 tablet, Rfl: 2 .  famciclovir (FAMVIR) 500 MG tablet, Take 1 tablet (500 mg total) by mouth daily., Disp: 30 tablet, Rfl: 6 .  Lactulose 20 GM/30ML SOLN, Take 30 mLs (20 g total) by mouth 4 (four) times daily., Disp: 1000 mL, Rfl: 5 .  lenalidomide (REVLIMID) 25 MG capsule, Take 1 capsule (25 mg total) by mouth daily. Take for 21 days and 7 days off. [id number], Disp: 21 capsule, Rfl: 0 .  LORazepam (ATIVAN) 0.5 MG tablet, Take 1 tablet (0.5 mg total) by mouth every 6 (six) hours as needed (Nausea or vomiting)., Disp: 60 tablet, Rfl: 0 .  prochlorperazine (COMPAZINE) 10 MG tablet, Take 1 tablet (10 mg total) by mouth every 6 (six) hours as needed (Nausea or vomiting). (Patient  not taking: Reported on 08/16/2014), Disp: 30 tablet, Rfl: 1 .  zolpidem (AMBIEN) 10 MG tablet, Take 10 mg by mouth at bedtime as needed. , Disp: , Rfl: 0  Allergies: No Known Allergies  Past Medical History, Surgical history, Social history, and Family History were reviewed and updated.  Review of Systems: As above  Physical Exam:  weight is 201 lb (91.173 kg). His oral temperature is 97.7 F (36.5 C). His blood pressure is 138/71 and his pulse is 64. His respiration is 16.   Wt Readings from Last 3 Encounters:  09/12/14 201 lb (91.173 kg)  08/16/14 198 lb (89.812 kg)  07/20/14 208 lb 1.8 oz (94.4 kg)     Well-developed and well-nourished white chum in no obvious distress. Head and neck exam shows no ocular or oral lesions. There are no palpable cervical or supraclavicular lymph nodes. Lungs are clear. Cardiac exam regular rate and rhythm with no murmurs, rubs or bruits. Abdomen is soft. He has good bowel sounds. There is no fluid wave. There is no palpable hepatomegaly. His spleen tip might be palpable at the left costal margin. Extremities shows no clubbing, cyanosis or edema. He may have some trace edema in his lower legs. Skin exam shows no rashes, ecchymoses or petechia. Neurological exam is nonfocal.  Lab Results  Component  Value Date   WBC 2.0* 09/12/2014   HGB 10.0* 09/12/2014   HCT 29.8* 09/12/2014   MCV 101* 09/12/2014   PLT 149 09/12/2014     Chemistry      Component Value Date/Time   NA 139 09/12/2014 0836   NA 129* 07/20/2014 1757   K 4.1 09/12/2014 0836   K 3.8 07/20/2014 1757   CL 110* 09/12/2014 0836   CL 103 07/20/2014 1757   CO2 26 09/12/2014 0836   CO2 21 07/20/2014 1757   BUN 13 09/12/2014 0836   BUN 30* 07/20/2014 1757   CREATININE 1.1 09/12/2014 0836   CREATININE 1.66* 07/20/2014 1757      Component Value Date/Time   CALCIUM 8.1 09/12/2014 0836   CALCIUM 7.9* 07/20/2014 1757   ALKPHOS 78 09/12/2014 0836   ALKPHOS 40 07/20/2014 1757   AST 25  09/12/2014 0836   AST 38* 07/20/2014 1757   ALT 38 09/12/2014 0836   ALT 73* 07/20/2014 1757   BILITOT 0.70 09/12/2014 0836   BILITOT 1.2 07/20/2014 1757         Impression and Plan: Tyler Phillips is 49 year old gentleman with IgG Kappa myeloma. He has extensive marrow involvement. His beta-2 microglobulin is almost 19. This by the highest that seen for a patient with myeloma.  Again, I know that he is responding. We will get his myeloma levels back next week and we will see what they look like.  I spent about 40 minutes with he and his wife. He had quite a few questions. I answered his questions.  I t talked to them about transplant. I went over some of the issues with transplant. I again reinforced the fact that I thought the transplant would be beneficial.  I will plan to see him back myself in one month.    [name], MD 5/26/20169:34 AM

## 2014-09-12 NOTE — Patient Instructions (Signed)
Bortezomib injection What is this medicine? BORTEZOMIB (bor TEZ oh mib) is a chemotherapy drug. It slows the growth of cancer cells. This medicine is used to treat multiple myeloma, and certain lymphomas, such as mantle-cell lymphoma. This medicine may be used for other purposes; ask your health care provider or pharmacist if you have questions. COMMON BRAND NAME(S): Velcade What should I tell my health care provider before I take this medicine? They need to know if you have any of these conditions: -diabetes -heart disease -irregular heartbeat -liver disease -on hemodialysis -low blood counts, like low white blood cells, platelets, or hemoglobin -peripheral neuropathy -taking medicine for blood pressure -an unusual or allergic reaction to bortezomib, mannitol, boron, other medicines, foods, dyes, or preservatives -pregnant or trying to get pregnant -breast-feeding How should I use this medicine? This medicine is for injection into a vein or for injection under the skin. It is given by a health care professional in a hospital or clinic setting. Talk to your pediatrician regarding the use of this medicine in children. Special care may be needed. Overdosage: If you think you have taken too much of this medicine contact a poison control center or emergency room at once. NOTE: This medicine is only for you. Do not share this medicine with others. What if I miss a dose? It is important not to miss your dose. Call your doctor or health care professional if you are unable to keep an appointment. What may interact with this medicine? This medicine may interact with the following medications: -ketoconazole -rifampin -ritonavir -St. John's Wort This list may not describe all possible interactions. Give your health care provider a list of all the medicines, herbs, non-prescription drugs, or dietary supplements you use. Also tell them if you smoke, drink alcohol, or use illegal drugs. Some items  may interact with your medicine. What should I watch for while using this medicine? Visit your doctor for checks on your progress. This drug may make you feel generally unwell. This is not uncommon, as chemotherapy can affect healthy cells as well as cancer cells. Report any side effects. Continue your course of treatment even though you feel ill unless your doctor tells you to stop. You may get drowsy or dizzy. Do not drive, use machinery, or do anything that needs mental alertness until you know how this medicine affects you. Do not stand or sit up quickly, especially if you are an older patient. This reduces the risk of dizzy or fainting spells. In some cases, you may be given additional medicines to help with side effects. Follow all directions for their use. Call your doctor or health care professional for advice if you get a fever, chills or sore throat, or other symptoms of a cold or flu. Do not treat yourself. This drug decreases your body's ability to fight infections. Try to avoid being around people who are sick. This medicine may increase your risk to bruise or bleed. Call your doctor or health care professional if you notice any unusual bleeding. You may need blood work done while you are taking this medicine. In some patients, this medicine may cause a serious brain infection that may cause death. If you have any problems seeing, thinking, speaking, walking, or standing, tell your doctor right away. If you cannot reach your doctor, urgently seek other source of medical care. Do not become pregnant while taking this medicine. Women should inform their doctor if they wish to become pregnant or think they might be pregnant. There is   a potential for serious side effects to an unborn child. Talk to your health care professional or pharmacist for more information. Do not breast-feed an infant while taking this medicine. Check with your doctor or health care professional if you get an attack of  severe diarrhea, nausea and vomiting, or if you sweat a lot. The loss of too much body fluid can make it dangerous for you to take this medicine. What side effects may I notice from receiving this medicine? Side effects that you should report to your doctor or health care professional as soon as possible: -allergic reactions like skin rash, itching or hives, swelling of the face, lips, or tongue -breathing problems -changes in hearing -changes in vision -fast, irregular heartbeat -feeling faint or lightheaded, falls -pain, tingling, numbness in the hands or feet -right upper belly pain -seizures -swelling of the ankles, feet, hands -unusual bleeding or bruising -unusually weak or tired -vomiting -yellowing of the eyes or skin Side effects that usually do not require medical attention (report to your doctor or health care professional if they continue or are bothersome): -changes in emotions or moods -constipation -diarrhea -loss of appetite -headache -irritation at site where injected -nausea This list may not describe all possible side effects. Call your doctor for medical advice about side effects. You may report side effects to FDA at 1-800-FDA-1088. Where should I keep my medicine? This drug is given in a hospital or clinic and will not be stored at home. NOTE: This sheet is a summary. It may not cover all possible information. If you have questions about this medicine, talk to your doctor, pharmacist, or health care provider.  2015, Elsevier/Gold Standard. (2013-01-29 12:46:32)  

## 2014-09-17 LAB — SPEP & IFE WITH QIG
Abnormal Protein Band1: 1.9 g/dL
Albumin ELP: 4.1 g/dL (ref 3.8–4.8)
Alpha-1-Globulin: 0.3 g/dL (ref 0.2–0.3)
Alpha-2-Globulin: 0.6 g/dL (ref 0.5–0.9)
Beta 2: 0.2 g/dL (ref 0.2–0.5)
Beta Globulin: 0.4 g/dL (ref 0.4–0.6)
Gamma Globulin: 2.2 g/dL — ABNORMAL HIGH (ref 0.8–1.7)
IgA: 31 mg/dL — ABNORMAL LOW (ref 68–379)
IgG (Immunoglobin G), Serum: 2530 mg/dL — ABNORMAL HIGH (ref 650–1600)
IgM, Serum: 37 mg/dL — ABNORMAL LOW (ref 41–251)
Total Protein, Serum Electrophoresis: 7.7 g/dL (ref 6.1–8.1)

## 2014-09-17 LAB — BETA 2 MICROGLOBULIN, SERUM: Beta-2 Microglobulin: 4.45 mg/L — ABNORMAL HIGH (ref <=2.51)

## 2014-09-17 LAB — KAPPA/LAMBDA LIGHT CHAINS
Kappa free light chain: 8.36 mg/dL — ABNORMAL HIGH (ref 0.33–1.94)
Kappa:Lambda Ratio: 8.8 — ABNORMAL HIGH (ref 0.26–1.65)
Lambda Free Lght Chn: 0.95 mg/dL (ref 0.57–2.63)

## 2014-09-18 ENCOUNTER — Other Ambulatory Visit: Payer: Self-pay | Admitting: *Deleted

## 2014-09-18 DIAGNOSIS — C9000 Multiple myeloma not having achieved remission: Secondary | ICD-10-CM

## 2014-09-19 ENCOUNTER — Other Ambulatory Visit (HOSPITAL_BASED_OUTPATIENT_CLINIC_OR_DEPARTMENT_OTHER): Payer: BLUE CROSS/BLUE SHIELD

## 2014-09-19 ENCOUNTER — Ambulatory Visit (HOSPITAL_BASED_OUTPATIENT_CLINIC_OR_DEPARTMENT_OTHER): Payer: BLUE CROSS/BLUE SHIELD

## 2014-09-19 VITALS — BP 140/80 | HR 68 | Temp 97.9°F

## 2014-09-19 DIAGNOSIS — C9000 Multiple myeloma not having achieved remission: Secondary | ICD-10-CM | POA: Diagnosis not present

## 2014-09-19 DIAGNOSIS — Z5112 Encounter for antineoplastic immunotherapy: Secondary | ICD-10-CM

## 2014-09-19 LAB — HOLD TUBE, BLOOD BANK - CHCC SATELLITE

## 2014-09-19 LAB — CBC WITH DIFFERENTIAL (CANCER CENTER ONLY)
BASO#: 0 10*3/uL (ref 0.0–0.2)
BASO%: 0.6 % (ref 0.0–2.0)
EOS%: 3.5 % (ref 0.0–7.0)
Eosinophils Absolute: 0.1 10*3/uL (ref 0.0–0.5)
HCT: 31.7 % — ABNORMAL LOW (ref 38.7–49.9)
HGB: 10.8 g/dL — ABNORMAL LOW (ref 13.0–17.1)
LYMPH#: 1.1 10*3/uL (ref 0.9–3.3)
LYMPH%: 61 % — ABNORMAL HIGH (ref 14.0–48.0)
MCH: 33.8 pg — ABNORMAL HIGH (ref 28.0–33.4)
MCHC: 34.1 g/dL (ref 32.0–35.9)
MCV: 99 fL — ABNORMAL HIGH (ref 82–98)
MONO#: 0.2 10*3/uL (ref 0.1–0.9)
MONO%: 11 % (ref 0.0–13.0)
NEUT#: 0.4 10*3/uL — CL (ref 1.5–6.5)
NEUT%: 23.9 % — ABNORMAL LOW (ref 40.0–80.0)
Platelets: 120 10*3/uL — ABNORMAL LOW (ref 145–400)
RBC: 3.2 10*6/uL — ABNORMAL LOW (ref 4.20–5.70)
RDW: 15 % (ref 11.1–15.7)
WBC: 1.7 10*3/uL — ABNORMAL LOW (ref 4.0–10.0)

## 2014-09-19 LAB — CMP (CANCER CENTER ONLY)
ALT(SGPT): 42 U/L (ref 10–47)
AST: 22 U/L (ref 11–38)
Albumin: 3.5 g/dL (ref 3.3–5.5)
Alkaline Phosphatase: 76 U/L (ref 26–84)
BUN, Bld: 10 mg/dL (ref 7–22)
CO2: 25 mEq/L (ref 18–33)
Calcium: 7.5 mg/dL — ABNORMAL LOW (ref 8.0–10.3)
Chloride: 103 mEq/L (ref 98–108)
Creat: 1 mg/dl (ref 0.6–1.2)
Glucose, Bld: 77 mg/dL (ref 73–118)
Potassium: 4.2 mEq/L (ref 3.3–4.7)
Sodium: 137 mEq/L (ref 128–145)
Total Bilirubin: 0.9 mg/dl (ref 0.20–1.60)
Total Protein: 7.4 g/dL (ref 6.4–8.1)

## 2014-09-19 MED ORDER — BORTEZOMIB CHEMO SQ INJECTION 3.5 MG (2.5MG/ML)
1.3000 mg/m2 | Freq: Once | INTRAMUSCULAR | Status: AC
  Administered 2014-09-19: 2.75 mg via SUBCUTANEOUS
  Filled 2014-09-19: qty 2.75

## 2014-09-19 MED ORDER — ONDANSETRON HCL 8 MG PO TABS: 8.0000 mg | ORAL_TABLET | Freq: Once | ORAL | Status: DC

## 2014-09-19 NOTE — Patient Instructions (Signed)
[facility] Discharge Instructions for Patients Receiving Chemotherapy  Today you received the following chemotherapy agents Velcade  To help prevent nausea and vomiting after your treatment, we encourage you to take your nausea medication    If you develop nausea and vomiting that is not controlled by your nausea medication, call the clinic.   BELOW ARE SYMPTOMS THAT SHOULD BE REPORTED IMMEDIATELY:  *FEVER GREATER THAN 100.5 F  *CHILLS WITH OR WITHOUT FEVER  NAUSEA AND VOMITING THAT IS NOT CONTROLLED WITH YOUR NAUSEA MEDICATION  *UNUSUAL SHORTNESS OF BREATH  *UNUSUAL BRUISING OR BLEEDING  TENDERNESS IN MOUTH AND THROAT WITH OR WITHOUT PRESENCE OF ULCERS  *URINARY PROBLEMS  *BOWEL PROBLEMS  UNUSUAL RASH Items with * indicate a potential emergency and should be followed up as soon as possible.  Feel free to call the clinic you have any questions or concerns. The clinic phone number is [phone].  Please show the CHEMO ALERT CARD at check-in to the Emergency Department and triage nurse.   

## 2014-09-26 ENCOUNTER — Ambulatory Visit (HOSPITAL_BASED_OUTPATIENT_CLINIC_OR_DEPARTMENT_OTHER): Payer: BLUE CROSS/BLUE SHIELD

## 2014-09-26 VITALS — BP 133/77 | HR 62 | Temp 98.0°F | Resp 20 | Wt 203.0 lb

## 2014-09-26 DIAGNOSIS — C9000 Multiple myeloma not having achieved remission: Secondary | ICD-10-CM

## 2014-09-26 DIAGNOSIS — Z5112 Encounter for antineoplastic immunotherapy: Secondary | ICD-10-CM

## 2014-09-26 MED ORDER — SODIUM CHLORIDE 0.9 % IV SOLN
Freq: Once | INTRAVENOUS | Status: AC
  Administered 2014-09-26: 11:00:00 via INTRAVENOUS

## 2014-09-26 MED ORDER — BORTEZOMIB CHEMO SQ INJECTION 3.5 MG (2.5MG/ML)
1.3000 mg/m2 | Freq: Once | INTRAMUSCULAR | Status: AC
  Administered 2014-09-26: 2.75 mg via SUBCUTANEOUS
  Filled 2014-09-26: qty 2.75

## 2014-09-26 MED ORDER — ZOLEDRONIC ACID 4 MG/100ML IV SOLN
4.0000 mg | Freq: Once | INTRAVENOUS | Status: AC
  Administered 2014-09-26: 4 mg via INTRAVENOUS
  Filled 2014-09-26: qty 100

## 2014-09-26 MED ORDER — ONDANSETRON HCL 8 MG PO TABS: 8.0000 mg | ORAL_TABLET | Freq: Once | ORAL | Status: DC

## 2014-09-26 NOTE — Progress Notes (Signed)
09/26/2014 OK with Dr. [name] to give Zometa with Ca level:  7.5,  7 days ago.

## 2014-09-26 NOTE — Patient Instructions (Signed)
Bortezomib injection What is this medicine? BORTEZOMIB (bor TEZ oh mib) is a chemotherapy drug. It slows the growth of cancer cells. This medicine is used to treat multiple myeloma, and certain lymphomas, such as mantle-cell lymphoma. This medicine may be used for other purposes; ask your health care provider or pharmacist if you have questions. COMMON BRAND NAME(S): Velcade What should I tell my health care provider before I take this medicine? They need to know if you have any of these conditions: -diabetes -heart disease -irregular heartbeat -liver disease -on hemodialysis -low blood counts, like low white blood cells, platelets, or hemoglobin -peripheral neuropathy -taking medicine for blood pressure -an unusual or allergic reaction to bortezomib, mannitol, boron, other medicines, foods, dyes, or preservatives -pregnant or trying to get pregnant -breast-feeding How should I use this medicine? This medicine is for injection into a vein or for injection under the skin. It is given by a health care professional in a hospital or clinic setting. Talk to your pediatrician regarding the use of this medicine in children. Special care may be needed. Overdosage: If you think you have taken too much of this medicine contact a poison control center or emergency room at once. NOTE: This medicine is only for you. Do not share this medicine with others. What if I miss a dose? It is important not to miss your dose. Call your doctor or health care professional if you are unable to keep an appointment. What may interact with this medicine? This medicine may interact with the following medications: -ketoconazole -rifampin -ritonavir -St. John's Wort This list may not describe all possible interactions. Give your health care provider a list of all the medicines, herbs, non-prescription drugs, or dietary supplements you use. Also tell them if you smoke, drink alcohol, or use illegal drugs. Some items  may interact with your medicine. What should I watch for while using this medicine? Visit your doctor for checks on your progress. This drug may make you feel generally unwell. This is not uncommon, as chemotherapy can affect healthy cells as well as cancer cells. Report any side effects. Continue your course of treatment even though you feel ill unless your doctor tells you to stop. You may get drowsy or dizzy. Do not drive, use machinery, or do anything that needs mental alertness until you know how this medicine affects you. Do not stand or sit up quickly, especially if you are an older patient. This reduces the risk of dizzy or fainting spells. In some cases, you may be given additional medicines to help with side effects. Follow all directions for their use. Call your doctor or health care professional for advice if you get a fever, chills or sore throat, or other symptoms of a cold or flu. Do not treat yourself. This drug decreases your body's ability to fight infections. Try to avoid being around people who are sick. This medicine may increase your risk to bruise or bleed. Call your doctor or health care professional if you notice any unusual bleeding. You may need blood work done while you are taking this medicine. In some patients, this medicine may cause a serious brain infection that may cause death. If you have any problems seeing, thinking, speaking, walking, or standing, tell your doctor right away. If you cannot reach your doctor, urgently seek other source of medical care. Do not become pregnant while taking this medicine. Women should inform their doctor if they wish to become pregnant or think they might be pregnant. There is   a potential for serious side effects to an unborn child. Talk to your health care professional or pharmacist for more information. Do not breast-feed an infant while taking this medicine. Check with your doctor or health care professional if you get an attack of  severe diarrhea, nausea and vomiting, or if you sweat a lot. The loss of too much body fluid can make it dangerous for you to take this medicine. What side effects may I notice from receiving this medicine? Side effects that you should report to your doctor or health care professional as soon as possible: -allergic reactions like skin rash, itching or hives, swelling of the face, lips, or tongue -breathing problems -changes in hearing -changes in vision -fast, irregular heartbeat -feeling faint or lightheaded, falls -pain, tingling, numbness in the hands or feet -right upper belly pain -seizures -swelling of the ankles, feet, hands -unusual bleeding or bruising -unusually weak or tired -vomiting -yellowing of the eyes or skin Side effects that usually do not require medical attention (report to your doctor or health care professional if they continue or are bothersome): -changes in emotions or moods -constipation -diarrhea -loss of appetite -headache -irritation at site where injected -nausea This list may not describe all possible side effects. Call your doctor for medical advice about side effects. You may report side effects to FDA at 1-800-FDA-1088. Where should I keep my medicine? This drug is given in a hospital or clinic and will not be stored at home. NOTE: This sheet is a summary. It may not cover all possible information. If you have questions about this medicine, talk to your doctor, pharmacist, or health care provider.  2015, Elsevier/Gold Standard. (2013-01-29 12:46:32) Zoledronic Acid injection (Hypercalcemia, Oncology) What is this medicine? ZOLEDRONIC ACID (ZOE le dron ik AS id) lowers the amount of calcium loss from bone. It is used to treat too much calcium in your blood from cancer. It is also used to prevent complications of cancer that has spread to the bone. This medicine may be used for other purposes; ask your health care provider or pharmacist if you have  questions. COMMON BRAND NAME(S): Zometa What should I tell my health care provider before I take this medicine? They need to know if you have any of these conditions: -aspirin-sensitive asthma -cancer, especially if you are receiving medicines used to treat cancer -dental disease or wear dentures -infection -kidney disease -receiving corticosteroids like dexamethasone or prednisone -an unusual or allergic reaction to zoledronic acid, other medicines, foods, dyes, or preservatives -pregnant or trying to get pregnant -breast-feeding How should I use this medicine? This medicine is for infusion into a vein. It is given by a health care professional in a hospital or clinic setting. Talk to your pediatrician regarding the use of this medicine in children. Special care may be needed. Overdosage: If you think you have taken too much of this medicine contact a poison control center or emergency room at once. NOTE: This medicine is only for you. Do not share this medicine with others. What if I miss a dose? It is important not to miss your dose. Call your doctor or health care professional if you are unable to keep an appointment. What may interact with this medicine? -certain antibiotics given by injection -NSAIDs, medicines for pain and inflammation, like ibuprofen or naproxen -some diuretics like bumetanide, furosemide -teriparatide -thalidomide This list may not describe all possible interactions. Give your health care provider a list of all the medicines, herbs, non-prescription drugs, or dietary   supplements you use. Also tell them if you smoke, drink alcohol, or use illegal drugs. Some items may interact with your medicine. What should I watch for while using this medicine? Visit your doctor or health care professional for regular checkups. It may be some time before you see the benefit from this medicine. Do not stop taking your medicine unless your doctor tells you to. Your doctor may  order blood tests or other tests to see how you are doing. Women should inform their doctor if they wish to become pregnant or think they might be pregnant. There is a potential for serious side effects to an unborn child. Talk to your health care professional or pharmacist for more information. You should make sure that you get enough calcium and vitamin D while you are taking this medicine. Discuss the foods you eat and the vitamins you take with your health care professional. Some people who take this medicine have severe bone, joint, and/or muscle pain. This medicine may also increase your risk for jaw problems or a broken thigh bone. Tell your doctor right away if you have severe pain in your jaw, bones, joints, or muscles. Tell your doctor if you have any pain that does not go away or that gets worse. Tell your dentist and dental surgeon that you are taking this medicine. You should not have major dental surgery while on this medicine. See your dentist to have a dental exam and fix any dental problems before starting this medicine. Take good care of your teeth while on this medicine. Make sure you see your dentist for regular follow-up appointments. What side effects may I notice from receiving this medicine? Side effects that you should report to your doctor or health care professional as soon as possible: -allergic reactions like skin rash, itching or hives, swelling of the face, lips, or tongue -anxiety, confusion, or depression -breathing problems -changes in vision -eye pain -feeling faint or lightheaded, falls -jaw pain, especially after dental work -mouth sores -muscle cramps, stiffness, or weakness -trouble passing urine or change in the amount of urine Side effects that usually do not require medical attention (report to your doctor or health care professional if they continue or are bothersome): -bone, joint, or muscle pain -constipation -diarrhea -fever -hair loss -irritation  at site where injected -loss of appetite -nausea, vomiting -stomach upset -trouble sleeping -trouble swallowing -weak or tired This list may not describe all possible side effects. Call your doctor for medical advice about side effects. You may report side effects to FDA at 1-800-FDA-1088. Where should I keep my medicine? This drug is given in a hospital or clinic and will not be stored at home. NOTE: This sheet is a summary. It may not cover all possible information. If you have questions about this medicine, talk to your doctor, pharmacist, or health care provider.  2015, Elsevier/Gold Standard. (2012-09-14 13:03:13)  

## 2014-10-02 ENCOUNTER — Other Ambulatory Visit: Payer: Self-pay | Admitting: *Deleted

## 2014-10-02 DIAGNOSIS — C9000 Multiple myeloma not having achieved remission: Secondary | ICD-10-CM

## 2014-10-02 MED ORDER — LENALIDOMIDE 25 MG PO CAPS: 25.0000 mg | ORAL_CAPSULE | Freq: Every day | ORAL | Status: DC

## 2014-10-11 ENCOUNTER — Ambulatory Visit (HOSPITAL_BASED_OUTPATIENT_CLINIC_OR_DEPARTMENT_OTHER): Payer: BLUE CROSS/BLUE SHIELD

## 2014-10-11 ENCOUNTER — Encounter: Payer: Self-pay | Admitting: Family

## 2014-10-11 ENCOUNTER — Ambulatory Visit (HOSPITAL_BASED_OUTPATIENT_CLINIC_OR_DEPARTMENT_OTHER): Payer: BLUE CROSS/BLUE SHIELD | Admitting: Family

## 2014-10-11 VITALS — BP 154/86 | HR 62 | Temp 98.1°F | Resp 16 | Ht 72.0 in | Wt 209.0 lb

## 2014-10-11 DIAGNOSIS — Z5111 Encounter for antineoplastic chemotherapy: Secondary | ICD-10-CM

## 2014-10-11 DIAGNOSIS — C9000 Multiple myeloma not having achieved remission: Secondary | ICD-10-CM | POA: Diagnosis not present

## 2014-10-11 LAB — CBC WITH DIFFERENTIAL (CANCER CENTER ONLY)
BASO#: 0 10*3/uL (ref 0.0–0.2)
BASO%: 0.6 % (ref 0.0–2.0)
EOS%: 0 % (ref 0.0–7.0)
Eosinophils Absolute: 0 10*3/uL (ref 0.0–0.5)
HCT: 35.4 % — ABNORMAL LOW (ref 38.7–49.9)
HGB: 12.3 g/dL — ABNORMAL LOW (ref 13.0–17.1)
LYMPH#: 0.6 10*3/uL — ABNORMAL LOW (ref 0.9–3.3)
LYMPH%: 32.2 % (ref 14.0–48.0)
MCH: 33.3 pg (ref 28.0–33.4)
MCHC: 34.7 g/dL (ref 32.0–35.9)
MCV: 96 fL (ref 82–98)
MONO#: 0.1 10*3/uL (ref 0.1–0.9)
MONO%: 4.4 % (ref 0.0–13.0)
NEUT#: 1.1 10*3/uL — ABNORMAL LOW (ref 1.5–6.5)
NEUT%: 62.8 % (ref 40.0–80.0)
Platelets: 182 10*3/uL (ref 145–400)
RBC: 3.69 10*6/uL — ABNORMAL LOW (ref 4.20–5.70)
RDW: 14.1 % (ref 11.1–15.7)
WBC: 1.8 10*3/uL — ABNORMAL LOW (ref 4.0–10.0)

## 2014-10-11 LAB — LACTATE DEHYDROGENASE: LDH: 134 U/L (ref 94–250)

## 2014-10-11 LAB — CMP (CANCER CENTER ONLY)
ALT(SGPT): 39 U/L (ref 10–47)
AST: 22 U/L (ref 11–38)
Albumin: 3.8 g/dL (ref 3.3–5.5)
Alkaline Phosphatase: 83 U/L (ref 26–84)
BUN, Bld: 12 mg/dL (ref 7–22)
CO2: 24 mEq/L (ref 18–33)
Calcium: 8.7 mg/dL (ref 8.0–10.3)
Chloride: 105 mEq/L (ref 98–108)
Creat: 1.4 mg/dl — ABNORMAL HIGH (ref 0.6–1.2)
Glucose, Bld: 115 mg/dL (ref 73–118)
Potassium: 4.7 mEq/L (ref 3.3–4.7)
Sodium: 137 mEq/L (ref 128–145)
Total Bilirubin: 0.7 mg/dl (ref 0.20–1.60)
Total Protein: 8.2 g/dL — ABNORMAL HIGH (ref 6.4–8.1)

## 2014-10-11 MED ORDER — BORTEZOMIB CHEMO SQ INJECTION 3.5 MG (2.5MG/ML)
1.3000 mg/m2 | Freq: Once | INTRAMUSCULAR | Status: AC
  Administered 2014-10-11: 2.75 mg via SUBCUTANEOUS
  Filled 2014-10-11: qty 2.75

## 2014-10-11 NOTE — Progress Notes (Signed)
Hematology and Oncology Follow Up Visit  Tyler Phillips [id number] February 06, 1966 49 y.o. 10/11/2014   Principle Diagnosis:  IgG Kappa myeloma  Current Therapy:   RVD s/p cycle 3  Zometa 4 mg IV every month    Interim History:  [name] is here today for a follow-up. He is doing really well. He just returned from a trip to [place] with his family. They had a great time. He has some mild fatigue at times.  His ANC has improved slightly at 1.1. He has had no problem with infections.  His Hgb has come up nicely as well to 12.3. He has not had to be transfused for a while now.  There have been no episode of bleeding or bruising.  No fever, chills, n/v, cough, rash, dizziness, SOB, chest pain, palpitations, abdominal pain, diarrhea, blood in urine or stool. He noticed that he became constipated after receiving Velcade so now he takes Lactulose after to relieve this.  No swelling, tenderness, numbness or tingling in his extremities. No new aches or pains.  He is eating well and staying hydrated. His weight is stable.   Medications:    Medication List       This list is accurate as of: 10/11/14  1:29 PM.  Always use your most recent med list.               aspirin 325 MG EC tablet  Take 325 mg by mouth daily.     calcium carbonate 500 MG chewable tablet  Commonly known as:  TUMS - dosed in mg elemental calcium  Chew 1 tablet by mouth 2 (two) times daily.     dexamethasone 4 MG tablet  Commonly known as:  DECADRON  Take 5 pills at one time once a week with food for 3 weeks then 1 week off     famciclovir 500 MG tablet  Commonly known as:  FAMVIR  Take 1 tablet (500 mg total) by mouth daily.     Lactulose 20 GM/30ML Soln  Take 30 mLs (20 g total) by mouth 4 (four) times daily.     lenalidomide 25 MG capsule  Commonly known as:  REVLIMID  Take 1 capsule (25 mg total) by mouth daily. Take for 21 days and 7 days off. Auth# [id number]     LORazepam 0.5 MG tablet  Commonly known  as:  ATIVAN  Take 1 tablet (0.5 mg total) by mouth every 6 (six) hours as needed (Nausea or vomiting).     prochlorperazine 10 MG tablet  Commonly known as:  COMPAZINE  Take 1 tablet (10 mg total) by mouth every 6 (six) hours as needed (Nausea or vomiting).     zolpidem 10 MG tablet  Commonly known as:  AMBIEN  Take 10 mg by mouth at bedtime as needed.        Allergies: No Known Allergies  Past Medical History, Surgical history, Social history, and Family History were reviewed and updated.  Review of Systems: All other 10 point review of systems is negative.   Physical Exam:  height is 6' (1.829 m) and weight is 209 lb (94.802 kg). His oral temperature is 98.1 F (36.7 C). His blood pressure is 154/86 and his pulse is 62. His respiration is 16.   Wt Readings from Last 3 Encounters:  10/11/14 209 lb (94.802 kg)  09/26/14 203 lb (92.08 kg)  09/12/14 201 lb (91.173 kg)    Ocular: Sclerae unicteric, pupils equal, round and reactive to  light Ear-nose-throat: Oropharynx clear, dentition fair Lymphatic: No cervical or supraclavicular adenopathy Lungs no rales or rhonchi, good excursion bilaterally Heart regular rate and rhythm, no murmur appreciated Abd soft, nontender, positive bowel sounds MSK no focal spinal tenderness, no joint edema Neuro: non-focal, well-oriented, appropriate affect Breasts: Deferred  Lab Results  Component Value Date   WBC 1.8* 10/11/2014   HGB 12.3* 10/11/2014   HCT 35.4* 10/11/2014   MCV 96 10/11/2014   PLT 182 10/11/2014   Lab Results  Component Value Date   FERRITIN 1263* 07/20/2014   IRON 125 07/20/2014   TIBC 198* 07/20/2014   UIBC 73* 07/20/2014   IRONPCTSAT 63* 07/20/2014   Lab Results  Component Value Date   RETICCTPCT 0.8 07/20/2014   RBC 3.69* 10/11/2014   Lab Results  Component Value Date   KPAFRELGTCHN 8.36* 09/12/2014   LAMBDASER 0.95 09/12/2014   KAPLAMBRATIO 8.80* 09/12/2014   Lab Results  Component Value Date    IGGSERUM 2530* 09/12/2014   IGA 31* 09/12/2014   IGMSERUM 37* 09/12/2014   Lab Results  Component Value Date   TOTALPROTELP 7.7 09/12/2014   ALBUMINELP 4.1 09/12/2014   A1GS 0.3 09/12/2014   A2GS 0.6 09/12/2014   BETS 0.4 09/12/2014   BETA2SER 0.2 09/12/2014   GAMS 2.2* 09/12/2014   MSPIKE 5.3 07/05/2014   SPEI * 09/12/2014     Chemistry      Component Value Date/Time   NA 137 10/11/2014 1106   NA 129* 07/20/2014 1757   K 4.7 10/11/2014 1106   K 3.8 07/20/2014 1757   CL 105 10/11/2014 1106   CL 103 07/20/2014 1757   CO2 24 10/11/2014 1106   CO2 21 07/20/2014 1757   BUN 12 10/11/2014 1106   BUN 30* 07/20/2014 1757   CREATININE 1.4* 10/11/2014 1106   CREATININE 1.66* 07/20/2014 1757      Component Value Date/Time   CALCIUM 8.7 10/11/2014 1106   CALCIUM 7.9* 07/20/2014 1757   ALKPHOS 83 10/11/2014 1106   ALKPHOS 40 07/20/2014 1757   AST 22 10/11/2014 1106   AST 38* 07/20/2014 1757   ALT 39 10/11/2014 1106   ALT 73* 07/20/2014 1757   BILITOT 0.70 10/11/2014 1106   BILITOT 1.2 07/20/2014 1757     Impression and Plan: [name] is 49 year old gentleman with IgG Kappa myeloma with extensive marrow involvement. His beta-2 microglobulin is now down to 4.45. He is doing well and is asymptomatic at this time.  His CBC is improved today. He does not need transfused.  We will proceed with cycle 4 of treatment today as planned. I will see how Dr. [name] wished to proceed when he returns on Monday.  He knows to call with any questions or concerns. We can certainly see him sooner if need be.    Eliezer Bottom, NP 6/24/20161:29 PM

## 2014-10-11 NOTE — Patient Instructions (Signed)
[facility] Discharge Instructions for Patients Receiving Chemotherapy  Today you received the following chemotherapy agents:  Velcade  To help prevent nausea and vomiting after your treatment, we encourage you to take your nausea medication as prescribed.   If you develop nausea and vomiting that is not controlled by your nausea medication, call the clinic.   BELOW ARE SYMPTOMS THAT SHOULD BE REPORTED IMMEDIATELY:  *FEVER GREATER THAN 100.5 F  *CHILLS WITH OR WITHOUT FEVER  NAUSEA AND VOMITING THAT IS NOT CONTROLLED WITH YOUR NAUSEA MEDICATION  *UNUSUAL SHORTNESS OF BREATH  *UNUSUAL BRUISING OR BLEEDING  TENDERNESS IN MOUTH AND THROAT WITH OR WITHOUT PRESENCE OF ULCERS  *URINARY PROBLEMS  *BOWEL PROBLEMS  UNUSUAL RASH Items with * indicate a potential emergency and should be followed up as soon as possible.  Feel free to call the clinic you have any questions or concerns. The clinic phone number is [phone].  Please show the CHEMO ALERT CARD at check-in to the Emergency Department and triage nurse.   

## 2014-10-15 LAB — PROTEIN ELECTROPHORESIS, SERUM, WITH REFLEX
Abnormal Protein Band1: 1.8 g/dL
Albumin ELP: 4.6 g/dL (ref 3.8–4.8)
Alpha-1-Globulin: 0.3 g/dL (ref 0.2–0.3)
Alpha-2-Globulin: 0.7 g/dL (ref 0.5–0.9)
Beta 2: 0.2 g/dL (ref 0.2–0.5)
Beta Globulin: 0.4 g/dL (ref 0.4–0.6)
Gamma Globulin: 2.1 g/dL — ABNORMAL HIGH (ref 0.8–1.7)
Total Protein, Serum Electrophoresis: 8.2 g/dL — ABNORMAL HIGH (ref 6.1–8.1)

## 2014-10-15 LAB — KAPPA/LAMBDA LIGHT CHAINS
Kappa free light chain: 4.62 mg/dL — ABNORMAL HIGH (ref 0.33–1.94)
Kappa:Lambda Ratio: 3.82 — ABNORMAL HIGH (ref 0.26–1.65)
Lambda Free Lght Chn: 1.21 mg/dL (ref 0.57–2.63)

## 2014-10-15 LAB — IGG, IGA, IGM
IgA: 41 mg/dL — ABNORMAL LOW (ref 68–379)
IgG (Immunoglobin G), Serum: 2310 mg/dL — ABNORMAL HIGH (ref 650–1600)
IgM, Serum: 53 mg/dL (ref 41–251)

## 2014-10-15 LAB — IFE INTERPRETATION

## 2014-10-15 LAB — BETA 2 MICROGLOBULIN, SERUM: Beta-2 Microglobulin: 4.64 mg/L — ABNORMAL HIGH (ref <=2.51)

## 2014-10-17 ENCOUNTER — Encounter: Payer: Self-pay | Admitting: *Deleted

## 2014-10-18 ENCOUNTER — Ambulatory Visit (HOSPITAL_BASED_OUTPATIENT_CLINIC_OR_DEPARTMENT_OTHER): Payer: BLUE CROSS/BLUE SHIELD

## 2014-10-18 ENCOUNTER — Telehealth: Payer: Self-pay | Admitting: Hematology & Oncology

## 2014-10-18 ENCOUNTER — Other Ambulatory Visit: Payer: Self-pay | Admitting: *Deleted

## 2014-10-18 ENCOUNTER — Other Ambulatory Visit (HOSPITAL_BASED_OUTPATIENT_CLINIC_OR_DEPARTMENT_OTHER): Payer: BLUE CROSS/BLUE SHIELD

## 2014-10-18 VITALS — BP 139/84 | HR 55 | Temp 97.8°F | Resp 18 | Ht 72.0 in | Wt 209.0 lb

## 2014-10-18 DIAGNOSIS — C9000 Multiple myeloma not having achieved remission: Secondary | ICD-10-CM

## 2014-10-18 DIAGNOSIS — Z5112 Encounter for antineoplastic immunotherapy: Secondary | ICD-10-CM | POA: Diagnosis not present

## 2014-10-18 LAB — CMP (CANCER CENTER ONLY)
ALT(SGPT): 47 U/L (ref 10–47)
AST: 25 U/L (ref 11–38)
Albumin: 3.8 g/dL (ref 3.3–5.5)
Alkaline Phosphatase: 66 U/L (ref 26–84)
BUN, Bld: 13 mg/dL (ref 7–22)
CO2: 28 mEq/L (ref 18–33)
Calcium: 8.5 mg/dL (ref 8.0–10.3)
Chloride: 104 mEq/L (ref 98–108)
Creat: 1.4 mg/dl — ABNORMAL HIGH (ref 0.6–1.2)
Glucose, Bld: 96 mg/dL (ref 73–118)
Potassium: 4.5 mEq/L (ref 3.3–4.7)
Sodium: 139 mEq/L (ref 128–145)
Total Bilirubin: 0.8 mg/dl (ref 0.20–1.60)
Total Protein: 7.5 g/dL (ref 6.4–8.1)

## 2014-10-18 LAB — CBC WITH DIFFERENTIAL (CANCER CENTER ONLY)
BASO#: 0 10*3/uL (ref 0.0–0.2)
BASO%: 0.3 % (ref 0.0–2.0)
EOS%: 4.8 % (ref 0.0–7.0)
Eosinophils Absolute: 0.2 10*3/uL (ref 0.0–0.5)
HCT: 35 % — ABNORMAL LOW (ref 38.7–49.9)
HGB: 12.3 g/dL — ABNORMAL LOW (ref 13.0–17.1)
LYMPH#: 1 10*3/uL (ref 0.9–3.3)
LYMPH%: 29.2 % (ref 14.0–48.0)
MCH: 33.4 pg (ref 28.0–33.4)
MCHC: 35.1 g/dL (ref 32.0–35.9)
MCV: 95 fL (ref 82–98)
MONO#: 0.2 10*3/uL (ref 0.1–0.9)
MONO%: 5.4 % (ref 0.0–13.0)
NEUT#: 2 10*3/uL (ref 1.5–6.5)
NEUT%: 60.3 % (ref 40.0–80.0)
Platelets: 133 10*3/uL — ABNORMAL LOW (ref 145–400)
RBC: 3.68 10*6/uL — ABNORMAL LOW (ref 4.20–5.70)
RDW: 13.8 % (ref 11.1–15.7)
WBC: 3.4 10*3/uL — ABNORMAL LOW (ref 4.0–10.0)

## 2014-10-18 MED ORDER — BORTEZOMIB CHEMO SQ INJECTION 3.5 MG (2.5MG/ML)
1.3000 mg/m2 | Freq: Once | INTRAMUSCULAR | Status: AC
  Administered 2014-10-18: 2.75 mg via SUBCUTANEOUS
  Filled 2014-10-18: qty 2.75

## 2014-10-18 NOTE — Telephone Encounter (Signed)
Lt mess regarding appt on 7/8 for infusion

## 2014-10-18 NOTE — Patient Instructions (Signed)
Bortezomib injection What is this medicine? BORTEZOMIB (bor TEZ oh mib) is a chemotherapy drug. It slows the growth of cancer cells. This medicine is used to treat multiple myeloma, and certain lymphomas, such as mantle-cell lymphoma. This medicine may be used for other purposes; ask your health care provider or pharmacist if you have questions. COMMON BRAND NAME(S): Velcade What should I tell my health care provider before I take this medicine? They need to know if you have any of these conditions: -diabetes -heart disease -irregular heartbeat -liver disease -on hemodialysis -low blood counts, like low white blood cells, platelets, or hemoglobin -peripheral neuropathy -taking medicine for blood pressure -an unusual or allergic reaction to bortezomib, mannitol, boron, other medicines, foods, dyes, or preservatives -pregnant or trying to get pregnant -breast-feeding How should I use this medicine? This medicine is for injection into a vein or for injection under the skin. It is given by a health care professional in a hospital or clinic setting. Talk to your pediatrician regarding the use of this medicine in children. Special care may be needed. Overdosage: If you think you have taken too much of this medicine contact a poison control center or emergency room at once. NOTE: This medicine is only for you. Do not share this medicine with others. What if I miss a dose? It is important not to miss your dose. Call your doctor or health care professional if you are unable to keep an appointment. What may interact with this medicine? This medicine may interact with the following medications: -ketoconazole -rifampin -ritonavir -St. John's Wort This list may not describe all possible interactions. Give your health care provider a list of all the medicines, herbs, non-prescription drugs, or dietary supplements you use. Also tell them if you smoke, drink alcohol, or use illegal drugs. Some items  may interact with your medicine. What should I watch for while using this medicine? Visit your doctor for checks on your progress. This drug may make you feel generally unwell. This is not uncommon, as chemotherapy can affect healthy cells as well as cancer cells. Report any side effects. Continue your course of treatment even though you feel ill unless your doctor tells you to stop. You may get drowsy or dizzy. Do not drive, use machinery, or do anything that needs mental alertness until you know how this medicine affects you. Do not stand or sit up quickly, especially if you are an older patient. This reduces the risk of dizzy or fainting spells. In some cases, you may be given additional medicines to help with side effects. Follow all directions for their use. Call your doctor or health care professional for advice if you get a fever, chills or sore throat, or other symptoms of a cold or flu. Do not treat yourself. This drug decreases your body's ability to fight infections. Try to avoid being around people who are sick. This medicine may increase your risk to bruise or bleed. Call your doctor or health care professional if you notice any unusual bleeding. You may need blood work done while you are taking this medicine. In some patients, this medicine may cause a serious brain infection that may cause death. If you have any problems seeing, thinking, speaking, walking, or standing, tell your doctor right away. If you cannot reach your doctor, urgently seek other source of medical care. Do not become pregnant while taking this medicine. Women should inform their doctor if they wish to become pregnant or think they might be pregnant. There is   a potential for serious side effects to an unborn child. Talk to your health care professional or pharmacist for more information. Do not breast-feed an infant while taking this medicine. Check with your doctor or health care professional if you get an attack of  severe diarrhea, nausea and vomiting, or if you sweat a lot. The loss of too much body fluid can make it dangerous for you to take this medicine. What side effects may I notice from receiving this medicine? Side effects that you should report to your doctor or health care professional as soon as possible: -allergic reactions like skin rash, itching or hives, swelling of the face, lips, or tongue -breathing problems -changes in hearing -changes in vision -fast, irregular heartbeat -feeling faint or lightheaded, falls -pain, tingling, numbness in the hands or feet -right upper belly pain -seizures -swelling of the ankles, feet, hands -unusual bleeding or bruising -unusually weak or tired -vomiting -yellowing of the eyes or skin Side effects that usually do not require medical attention (report to your doctor or health care professional if they continue or are bothersome): -changes in emotions or moods -constipation -diarrhea -loss of appetite -headache -irritation at site where injected -nausea This list may not describe all possible side effects. Call your doctor for medical advice about side effects. You may report side effects to FDA at 1-800-FDA-1088. Where should I keep my medicine? This drug is given in a hospital or clinic and will not be stored at home. NOTE: This sheet is a summary. It may not cover all possible information. If you have questions about this medicine, talk to your doctor, pharmacist, or health care provider.  2015, Elsevier/Gold Standard. (2013-01-29 12:46:32)  

## 2014-10-22 ENCOUNTER — Telehealth: Payer: Self-pay | Admitting: Hematology & Oncology

## 2014-10-22 NOTE — Telephone Encounter (Signed)
Pt aware of appt on 7/8 and will be given new calendar when arrive

## 2014-10-25 ENCOUNTER — Ambulatory Visit (HOSPITAL_BASED_OUTPATIENT_CLINIC_OR_DEPARTMENT_OTHER): Payer: BLUE CROSS/BLUE SHIELD

## 2014-10-25 ENCOUNTER — Other Ambulatory Visit: Payer: Self-pay | Admitting: Family

## 2014-10-25 VITALS — BP 135/72 | HR 66 | Temp 98.2°F | Resp 18

## 2014-10-25 DIAGNOSIS — Z5112 Encounter for antineoplastic immunotherapy: Secondary | ICD-10-CM | POA: Diagnosis not present

## 2014-10-25 DIAGNOSIS — C9000 Multiple myeloma not having achieved remission: Secondary | ICD-10-CM | POA: Diagnosis not present

## 2014-10-25 LAB — CBC WITH DIFFERENTIAL (CANCER CENTER ONLY)
BASO#: 0 10*3/uL (ref 0.0–0.2)
BASO%: 0.2 % (ref 0.0–2.0)
EOS%: 0.2 % (ref 0.0–7.0)
Eosinophils Absolute: 0 10*3/uL (ref 0.0–0.5)
HCT: 37.9 % — ABNORMAL LOW (ref 38.7–49.9)
HGB: 13.4 g/dL (ref 13.0–17.1)
LYMPH#: 0.7 10*3/uL — ABNORMAL LOW (ref 0.9–3.3)
LYMPH%: 13.3 % — ABNORMAL LOW (ref 14.0–48.0)
MCH: 32.8 pg (ref 28.0–33.4)
MCHC: 35.4 g/dL (ref 32.0–35.9)
MCV: 93 fL (ref 82–98)
MONO#: 0.1 10*3/uL (ref 0.1–0.9)
MONO%: 1.6 % (ref 0.0–13.0)
NEUT#: 4.3 10*3/uL (ref 1.5–6.5)
NEUT%: 84.7 % — ABNORMAL HIGH (ref 40.0–80.0)
Platelets: 128 10*3/uL — ABNORMAL LOW (ref 145–400)
RBC: 4.08 10*6/uL — ABNORMAL LOW (ref 4.20–5.70)
RDW: 13.2 % (ref 11.1–15.7)
WBC: 5 10*3/uL (ref 4.0–10.0)

## 2014-10-25 MED ORDER — ZOLEDRONIC ACID 4 MG/100ML IV SOLN
4.0000 mg | Freq: Once | INTRAVENOUS | Status: DC
  Filled 2014-10-25: qty 100

## 2014-10-25 MED ORDER — BORTEZOMIB CHEMO SQ INJECTION 3.5 MG (2.5MG/ML)
1.3000 mg/m2 | Freq: Once | INTRAMUSCULAR | Status: AC
  Administered 2014-10-25: 2.75 mg via SUBCUTANEOUS
  Filled 2014-10-25: qty 2.75

## 2014-10-25 MED ORDER — ZOLEDRONIC ACID 4 MG/100ML IV SOLN
4.0000 mg | Freq: Once | INTRAVENOUS | Status: AC
  Administered 2014-10-25: 4 mg via INTRAVENOUS
  Filled 2014-10-25: qty 100

## 2014-10-25 NOTE — Patient Instructions (Signed)
[facility] Discharge Instructions for Patients Receiving Chemotherapy  Today you received the following chemotherapy agents Zometa and Velcade.  To help prevent nausea and vomiting after your treatment, we encourage you to take your nausea medication.   If you develop nausea and vomiting that is not controlled by your nausea medication, call the clinic.   BELOW ARE SYMPTOMS THAT SHOULD BE REPORTED IMMEDIATELY:  *FEVER GREATER THAN 100.5 F  *CHILLS WITH OR WITHOUT FEVER  NAUSEA AND VOMITING THAT IS NOT CONTROLLED WITH YOUR NAUSEA MEDICATION  *UNUSUAL SHORTNESS OF BREATH  *UNUSUAL BRUISING OR BLEEDING  TENDERNESS IN MOUTH AND THROAT WITH OR WITHOUT PRESENCE OF ULCERS  *URINARY PROBLEMS  *BOWEL PROBLEMS  UNUSUAL RASH Items with * indicate a potential emergency and should be followed up as soon as possible.  Feel free to call the clinic you have any questions or concerns. The clinic phone number is [phone].  Please show the [facility] at check-in to the Emergency Department and triage nurse.

## 2014-10-29 LAB — PROTEIN ELECTROPHORESIS, SERUM, WITH REFLEX
Abnormal Protein Band1: 1.4 g/dL
Albumin ELP: 4.5 g/dL (ref 3.8–4.8)
Alpha-1-Globulin: 0.3 g/dL (ref 0.2–0.3)
Alpha-2-Globulin: 0.6 g/dL (ref 0.5–0.9)
Beta 2: 0.2 g/dL (ref 0.2–0.5)
Beta Globulin: 0.4 g/dL (ref 0.4–0.6)
Gamma Globulin: 1.8 g/dL — ABNORMAL HIGH (ref 0.8–1.7)
Total Protein, Serum Electrophoresis: 7.7 g/dL (ref 6.1–8.1)

## 2014-10-29 LAB — COMPREHENSIVE METABOLIC PANEL
ALT: 53 U/L (ref 0–53)
AST: 23 U/L (ref 0–37)
Albumin: 4.2 g/dL (ref 3.5–5.2)
Alkaline Phosphatase: 68 U/L (ref 39–117)
BUN: 18 mg/dL (ref 6–23)
CO2: 21 mEq/L (ref 19–32)
Calcium: 8.5 mg/dL (ref 8.4–10.5)
Chloride: 105 mEq/L (ref 96–112)
Creatinine, Ser: 1.15 mg/dL (ref 0.50–1.35)
Glucose, Bld: 111 mg/dL — ABNORMAL HIGH (ref 70–99)
Potassium: 5.2 mEq/L (ref 3.5–5.3)
Sodium: 137 mEq/L (ref 135–145)
Total Bilirubin: 0.6 mg/dL (ref 0.2–1.2)
Total Protein: 7.7 g/dL (ref 6.0–8.3)

## 2014-10-29 LAB — IGG, IGA, IGM
IgA: 48 mg/dL — ABNORMAL LOW (ref 68–379)
IgG (Immunoglobin G), Serum: 1840 mg/dL — ABNORMAL HIGH (ref 650–1600)
IgM, Serum: 71 mg/dL (ref 41–251)

## 2014-10-29 LAB — KAPPA/LAMBDA LIGHT CHAINS
Kappa free light chain: 4.37 mg/dL — ABNORMAL HIGH (ref 0.33–1.94)
Kappa:Lambda Ratio: 3.87 — ABNORMAL HIGH (ref 0.26–1.65)
Lambda Free Lght Chn: 1.13 mg/dL (ref 0.57–2.63)

## 2014-10-29 LAB — IFE INTERPRETATION

## 2014-10-30 ENCOUNTER — Other Ambulatory Visit: Payer: Self-pay | Admitting: *Deleted

## 2014-10-30 ENCOUNTER — Telehealth: Payer: Self-pay | Admitting: *Deleted

## 2014-10-30 DIAGNOSIS — C9000 Multiple myeloma not having achieved remission: Secondary | ICD-10-CM

## 2014-10-30 MED ORDER — LENALIDOMIDE 25 MG PO CAPS: 25.0000 mg | ORAL_CAPSULE | Freq: Every day | ORAL | Status: DC

## 2014-10-30 NOTE — Telephone Encounter (Signed)
-----   Message from [name], MD sent at 10/30/2014  7:02 AM EDT ----- Call - myeloma protein is now down to 1.4!!  It was 1.8.  We are still on the right track!!!  pete

## 2014-10-31 ENCOUNTER — Other Ambulatory Visit: Payer: Self-pay | Admitting: *Deleted

## 2014-10-31 DIAGNOSIS — C9000 Multiple myeloma not having achieved remission: Secondary | ICD-10-CM

## 2014-11-06 ENCOUNTER — Other Ambulatory Visit: Payer: Self-pay | Admitting: Nurse Practitioner

## 2014-11-06 ENCOUNTER — Other Ambulatory Visit: Payer: Self-pay | Admitting: Hematology & Oncology

## 2014-11-06 DIAGNOSIS — C9000 Multiple myeloma not having achieved remission: Secondary | ICD-10-CM

## 2014-11-06 MED ORDER — PROCHLORPERAZINE MALEATE 10 MG PO TABS: 10.0000 mg | ORAL_TABLET | Freq: Four times a day (QID) | ORAL | Status: DC | PRN

## 2014-11-06 MED ORDER — FAMCICLOVIR 500 MG PO TABS: 500.0000 mg | ORAL_TABLET | Freq: Every day | ORAL | Status: DC

## 2014-11-06 MED ORDER — LORAZEPAM 0.5 MG PO TABS: 0.5000 mg | ORAL_TABLET | Freq: Four times a day (QID) | ORAL | Status: DC | PRN

## 2014-11-07 ENCOUNTER — Other Ambulatory Visit: Payer: Self-pay | Admitting: *Deleted

## 2014-11-07 DIAGNOSIS — C9000 Multiple myeloma not having achieved remission: Secondary | ICD-10-CM

## 2014-11-08 ENCOUNTER — Encounter: Payer: Self-pay | Admitting: Hematology & Oncology

## 2014-11-08 ENCOUNTER — Ambulatory Visit (HOSPITAL_BASED_OUTPATIENT_CLINIC_OR_DEPARTMENT_OTHER): Payer: BLUE CROSS/BLUE SHIELD

## 2014-11-08 ENCOUNTER — Ambulatory Visit (HOSPITAL_BASED_OUTPATIENT_CLINIC_OR_DEPARTMENT_OTHER): Payer: BLUE CROSS/BLUE SHIELD | Admitting: Hematology & Oncology

## 2014-11-08 ENCOUNTER — Other Ambulatory Visit (HOSPITAL_BASED_OUTPATIENT_CLINIC_OR_DEPARTMENT_OTHER): Payer: BLUE CROSS/BLUE SHIELD

## 2014-11-08 VITALS — BP 151/84 | HR 74 | Temp 97.6°F | Resp 18 | Ht 72.0 in | Wt 209.0 lb

## 2014-11-08 DIAGNOSIS — C9000 Multiple myeloma not having achieved remission: Secondary | ICD-10-CM | POA: Diagnosis not present

## 2014-11-08 DIAGNOSIS — Z5112 Encounter for antineoplastic immunotherapy: Secondary | ICD-10-CM

## 2014-11-08 LAB — BASIC METABOLIC PANEL - CANCER CENTER ONLY
BUN, Bld: 16 mg/dL (ref 7–22)
CO2: 20 mEq/L (ref 18–33)
Calcium: 7.9 mg/dL — ABNORMAL LOW (ref 8.0–10.3)
Chloride: 104 mEq/L (ref 98–108)
Creat: 1.1 mg/dl (ref 0.6–1.2)
Glucose, Bld: 191 mg/dL — ABNORMAL HIGH (ref 73–118)
Potassium: 4.6 mEq/L (ref 3.3–4.7)
Sodium: 136 mEq/L (ref 128–145)

## 2014-11-08 LAB — CBC WITH DIFFERENTIAL (CANCER CENTER ONLY)
BASO#: 0 10*3/uL (ref 0.0–0.2)
BASO%: 0.3 % (ref 0.0–2.0)
EOS%: 0 % (ref 0.0–7.0)
Eosinophils Absolute: 0 10*3/uL (ref 0.0–0.5)
HCT: 35 % — ABNORMAL LOW (ref 38.7–49.9)
HGB: 12.4 g/dL — ABNORMAL LOW (ref 13.0–17.1)
LYMPH#: 0.3 10*3/uL — ABNORMAL LOW (ref 0.9–3.3)
LYMPH%: 10.9 % — ABNORMAL LOW (ref 14.0–48.0)
MCH: 32.8 pg (ref 28.0–33.4)
MCHC: 35.4 g/dL (ref 32.0–35.9)
MCV: 93 fL (ref 82–98)
MONO#: 0 10*3/uL — ABNORMAL LOW (ref 0.1–0.9)
MONO%: 0.6 % (ref 0.0–13.0)
NEUT#: 2.8 10*3/uL (ref 1.5–6.5)
NEUT%: 88.2 % — ABNORMAL HIGH (ref 40.0–80.0)
Platelets: 182 10*3/uL (ref 145–400)
RBC: 3.78 10*6/uL — ABNORMAL LOW (ref 4.20–5.70)
RDW: 13.3 % (ref 11.1–15.7)
WBC: 3.1 10*3/uL — ABNORMAL LOW (ref 4.0–10.0)

## 2014-11-08 MED ORDER — BORTEZOMIB CHEMO SQ INJECTION 3.5 MG (2.5MG/ML)
1.3000 mg/m2 | Freq: Once | INTRAMUSCULAR | Status: AC
  Administered 2014-11-08: 2.75 mg via SUBCUTANEOUS
  Filled 2014-11-08: qty 2.75

## 2014-11-08 NOTE — Patient Instructions (Signed)
Bortezomib injection What is this medicine? BORTEZOMIB (bor TEZ oh mib) is a chemotherapy drug. It slows the growth of cancer cells. This medicine is used to treat multiple myeloma, and certain lymphomas, such as mantle-cell lymphoma. This medicine may be used for other purposes; ask your health care provider or pharmacist if you have questions. COMMON BRAND NAME(S): Velcade What should I tell my health care provider before I take this medicine? They need to know if you have any of these conditions: -diabetes -heart disease -irregular heartbeat -liver disease -on hemodialysis -low blood counts, like low white blood cells, platelets, or hemoglobin -peripheral neuropathy -taking medicine for blood pressure -an unusual or allergic reaction to bortezomib, mannitol, boron, other medicines, foods, dyes, or preservatives -pregnant or trying to get pregnant -breast-feeding How should I use this medicine? This medicine is for injection into a vein or for injection under the skin. It is given by a health care professional in a hospital or clinic setting. Talk to your pediatrician regarding the use of this medicine in children. Special care may be needed. Overdosage: If you think you have taken too much of this medicine contact a poison control center or emergency room at once. NOTE: This medicine is only for you. Do not share this medicine with others. What if I miss a dose? It is important not to miss your dose. Call your doctor or health care professional if you are unable to keep an appointment. What may interact with this medicine? This medicine may interact with the following medications: -ketoconazole -rifampin -ritonavir -St. John's Wort This list may not describe all possible interactions. Give your health care provider a list of all the medicines, herbs, non-prescription drugs, or dietary supplements you use. Also tell them if you smoke, drink alcohol, or use illegal drugs. Some items  may interact with your medicine. What should I watch for while using this medicine? Visit your doctor for checks on your progress. This drug may make you feel generally unwell. This is not uncommon, as chemotherapy can affect healthy cells as well as cancer cells. Report any side effects. Continue your course of treatment even though you feel ill unless your doctor tells you to stop. You may get drowsy or dizzy. Do not drive, use machinery, or do anything that needs mental alertness until you know how this medicine affects you. Do not stand or sit up quickly, especially if you are an older patient. This reduces the risk of dizzy or fainting spells. In some cases, you may be given additional medicines to help with side effects. Follow all directions for their use. Call your doctor or health care professional for advice if you get a fever, chills or sore throat, or other symptoms of a cold or flu. Do not treat yourself. This drug decreases your body's ability to fight infections. Try to avoid being around people who are sick. This medicine may increase your risk to bruise or bleed. Call your doctor or health care professional if you notice any unusual bleeding. You may need blood work done while you are taking this medicine. In some patients, this medicine may cause a serious brain infection that may cause death. If you have any problems seeing, thinking, speaking, walking, or standing, tell your doctor right away. If you cannot reach your doctor, urgently seek other source of medical care. Do not become pregnant while taking this medicine. Women should inform their doctor if they wish to become pregnant or think they might be pregnant. There is   a potential for serious side effects to an unborn child. Talk to your health care professional or pharmacist for more information. Do not breast-feed an infant while taking this medicine. Check with your doctor or health care professional if you get an attack of  severe diarrhea, nausea and vomiting, or if you sweat a lot. The loss of too much body fluid can make it dangerous for you to take this medicine. What side effects may I notice from receiving this medicine? Side effects that you should report to your doctor or health care professional as soon as possible: -allergic reactions like skin rash, itching or hives, swelling of the face, lips, or tongue -breathing problems -changes in hearing -changes in vision -fast, irregular heartbeat -feeling faint or lightheaded, falls -pain, tingling, numbness in the hands or feet -right upper belly pain -seizures -swelling of the ankles, feet, hands -unusual bleeding or bruising -unusually weak or tired -vomiting -yellowing of the eyes or skin Side effects that usually do not require medical attention (report to your doctor or health care professional if they continue or are bothersome): -changes in emotions or moods -constipation -diarrhea -loss of appetite -headache -irritation at site where injected -nausea This list may not describe all possible side effects. Call your doctor for medical advice about side effects. You may report side effects to FDA at 1-800-FDA-1088. Where should I keep my medicine? This drug is given in a hospital or clinic and will not be stored at home. NOTE: This sheet is a summary. It may not cover all possible information. If you have questions about this medicine, talk to your doctor, pharmacist, or health care provider.  2015, Elsevier/Gold Standard. (2013-01-29 12:46:32)  

## 2014-11-08 NOTE — Progress Notes (Signed)
Hematology and Oncology Follow Up Visit  Tyler Phillips [id number] December 19, 1965 49 y.o. 11/08/2014   Principle Diagnosis:   IgG Kappa myeloma  Current Therapy:    S/p Cycle #4 of RVD  Zometa 4 mg IV every month     Interim History:  Tyler Phillips is back for follow-up. He  continues to do quite well. He is more active. He is doing more things around his property.  His myeloma studies have come down real nicely. Back a couple weeks ago, his M spike was down to 1.4 g/dL. His IgG level was 1840 mg/dL. His Kappa Lightchain was 4.37 mg/dL.  His beta-2 microglobulin in June was at a 4.64. When we first saw him, this was 19.  He has been on some medication. He is enjoying himself. He is able to do more physically.  We've not transfuse him now for about 3 months. This, to me, means that the treatments are working and that the myeloma is being cleaned out of his bone marrow.  He's had no fever. He's had no rashes.  He continues with his aspirin.  He does get a little bit of constipation after treatment. Lactulose does seem to help this.  He's not noted any cough. He still has some shortness of breath.  We are checking his myeloma studies today. I would have to suspect that they are continuing to improve.   Overall, his performance status is ECOG 1.  Medications:  Current outpatient prescriptions:  .  aspirin 325 MG EC tablet, Take 325 mg by mouth daily., Disp: , Rfl:  .  calcium carbonate (TUMS - DOSED IN MG ELEMENTAL CALCIUM) 500 MG chewable tablet, Chew 1 tablet by mouth 2 (two) times daily., Disp: , Rfl:  .  dexamethasone (DECADRON) 4 MG tablet, TAKE 5 TABLET AT ONE TIME WITH FOOD FOR 3 WEEKS THEN OFF ONE WEEK *INS ALLOWS 30 DAYS, Disp: 60 tablet, Rfl: 1 .  famciclovir (FAMVIR) 500 MG tablet, Take 1 tablet (500 mg total) by mouth daily., Disp: 30 tablet, Rfl: 6 .  Lactulose 20 GM/30ML SOLN, Take 30 mLs (20 g total) by mouth 4 (four) times daily. (Patient taking differently: Take 30 mLs  by mouth as needed. ), Disp: 1000 mL, Rfl: 5 .  lenalidomide (REVLIMID) 25 MG capsule, Take 1 capsule (25 mg total) by mouth daily. Take for 21 days and 7 days off. Auth# [id number], Disp: 21 capsule, Rfl: 0 .  LORazepam (ATIVAN) 0.5 MG tablet, Take 1 tablet (0.5 mg total) by mouth every 6 (six) hours as needed (Nausea or vomiting)., Disp: 60 tablet, Rfl: 0 .  prochlorperazine (COMPAZINE) 10 MG tablet, Take 1 tablet (10 mg total) by mouth every 6 (six) hours as needed (Nausea or vomiting)., Disp: 30 tablet, Rfl: 6 .  zolpidem (AMBIEN) 10 MG tablet, Take 10 mg by mouth at bedtime as needed. , Disp: , Rfl: 0  Allergies: No Known Allergies  Past Medical History, Surgical history, Social history, and Family History were reviewed and updated.  Review of Systems: As above  Physical Exam:  height is 6' (1.829 m) and weight is 209 lb (94.802 kg). His oral temperature is 97.6 F (36.4 C). His blood pressure is 151/84 and his pulse is 74. His respiration is 18.   Wt Readings from Last 3 Encounters:  11/08/14 209 lb (94.802 kg)  10/18/14 209 lb (94.802 kg)  10/11/14 209 lb (94.802 kg)     Well-developed and well-nourished white chum in no obvious distress. Head  and neck exam shows no ocular or oral lesions. There are no palpable cervical or supraclavicular lymph nodes. Lungs are clear. Cardiac exam regular rate and rhythm with no murmurs, rubs or bruits. Abdomen is soft. He has good bowel sounds. There is no fluid wave. There is no palpable hepatomegaly. His spleen tip might be palpable at the left costal margin. Extremities shows no clubbing, cyanosis or edema. He may have some trace edema in his lower legs. Skin exam shows no rashes, ecchymoses or petechia. Neurological exam is nonfocal.  Lab Results  Component Value Date   WBC 3.1* 11/08/2014   HGB 12.4* 11/08/2014   HCT 35.0* 11/08/2014   MCV 93 11/08/2014   PLT 182 11/08/2014     Chemistry      Component Value Date/Time   NA 136  11/08/2014 1222   NA 137 10/25/2014 1103   K 4.6 11/08/2014 1222   K 5.2 10/25/2014 1103   CL 104 11/08/2014 1222   CL 105 10/25/2014 1103   CO2 20 11/08/2014 1222   CO2 21 10/25/2014 1103   BUN 16 11/08/2014 1222   BUN 18 10/25/2014 1103   CREATININE 1.1 11/08/2014 1222   CREATININE 1.15 10/25/2014 1103      Component Value Date/Time   CALCIUM 7.9* 11/08/2014 1222   CALCIUM 8.5 10/25/2014 1103   ALKPHOS 68 10/25/2014 1103   ALKPHOS 66 10/18/2014 1126   AST 23 10/25/2014 1103   AST 25 10/18/2014 1126   ALT 53 10/25/2014 1103   ALT 47 10/18/2014 1126   BILITOT 0.6 10/25/2014 1103   BILITOT 0.80 10/18/2014 1126         Impression and Plan: Tyler Phillips is 49 year old gentleman with IgG Kappa myeloma. He has extensive marrow involvement. His beta-2 microglobulin is almost 19. This by the highest that seen for a patient with myeloma.  Again, I know that he is responding. We will get his myeloma levels back next week and we will see what they look like.  I spent about 40 minutes with he and his wife. He had quite a few questions. I answered his questions.  I  talked to them about transplant. I went over some of the issues with transplant. I again reinforced the fact that I thought the transplant would be beneficial.  I will plan to see him back myself in one month.  Hopefully, this will be the last month that he will need therapy. We will see what his myeloma studies look like and hopefully be able to get him into a transplant program.    [name], MD 7/22/20165:08 PM

## 2014-11-12 LAB — SPEP & IFE WITH QIG
Abnormal Protein Band1: 1.5 g/dL
Albumin ELP: 4.4 g/dL (ref 3.8–4.8)
Alpha-1-Globulin: 0.3 g/dL (ref 0.2–0.3)
Alpha-2-Globulin: 0.7 g/dL (ref 0.5–0.9)
Beta 2: 0.2 g/dL (ref 0.2–0.5)
Beta Globulin: 0.4 g/dL (ref 0.4–0.6)
Gamma Globulin: 1.8 g/dL — ABNORMAL HIGH (ref 0.8–1.7)
IgA: 42 mg/dL — ABNORMAL LOW (ref 68–379)
IgG (Immunoglobin G), Serum: 1990 mg/dL — ABNORMAL HIGH (ref 650–1600)
IgM, Serum: 50 mg/dL (ref 41–251)
Total Protein, Serum Electrophoresis: 7.8 g/dL (ref 6.1–8.1)

## 2014-11-12 LAB — BETA 2 MICROGLOBULIN, SERUM: Beta-2 Microglobulin: 2.93 mg/L — ABNORMAL HIGH (ref <=2.51)

## 2014-11-12 LAB — KAPPA/LAMBDA LIGHT CHAINS
Kappa free light chain: 3.19 mg/dL — ABNORMAL HIGH (ref 0.33–1.94)
Kappa:Lambda Ratio: 3.54 — ABNORMAL HIGH (ref 0.26–1.65)
Lambda Free Lght Chn: 0.9 mg/dL (ref 0.57–2.63)

## 2014-11-12 LAB — LACTATE DEHYDROGENASE: LDH: 135 U/L (ref 94–250)

## 2014-11-15 ENCOUNTER — Ambulatory Visit (HOSPITAL_BASED_OUTPATIENT_CLINIC_OR_DEPARTMENT_OTHER): Payer: BLUE CROSS/BLUE SHIELD

## 2014-11-15 VITALS — BP 128/64 | HR 64 | Temp 98.5°F | Resp 20

## 2014-11-15 DIAGNOSIS — Z5112 Encounter for antineoplastic immunotherapy: Secondary | ICD-10-CM

## 2014-11-15 DIAGNOSIS — C9000 Multiple myeloma not having achieved remission: Secondary | ICD-10-CM | POA: Diagnosis not present

## 2014-11-15 MED ORDER — BORTEZOMIB CHEMO SQ INJECTION 3.5 MG (2.5MG/ML)
1.3000 mg/m2 | Freq: Once | INTRAMUSCULAR | Status: AC
  Administered 2014-11-15: 2.75 mg via SUBCUTANEOUS
  Filled 2014-11-15: qty 2.75

## 2014-11-15 NOTE — Patient Instructions (Signed)
Bortezomib injection What is this medicine? BORTEZOMIB (bor TEZ oh mib) is a chemotherapy drug. It slows the growth of cancer cells. This medicine is used to treat multiple myeloma, and certain lymphomas, such as mantle-cell lymphoma. This medicine may be used for other purposes; ask your health care provider or pharmacist if you have questions. COMMON BRAND NAME(S): Velcade What should I tell my health care provider before I take this medicine? They need to know if you have any of these conditions: -diabetes -heart disease -irregular heartbeat -liver disease -on hemodialysis -low blood counts, like low white blood cells, platelets, or hemoglobin -peripheral neuropathy -taking medicine for blood pressure -an unusual or allergic reaction to bortezomib, mannitol, boron, other medicines, foods, dyes, or preservatives -pregnant or trying to get pregnant -breast-feeding How should I use this medicine? This medicine is for injection into a vein or for injection under the skin. It is given by a health care professional in a hospital or clinic setting. Talk to your pediatrician regarding the use of this medicine in children. Special care may be needed. Overdosage: If you think you have taken too much of this medicine contact a poison control center or emergency room at once. NOTE: This medicine is only for you. Do not share this medicine with others. What if I miss a dose? It is important not to miss your dose. Call your doctor or health care professional if you are unable to keep an appointment. What may interact with this medicine? This medicine may interact with the following medications: -ketoconazole -rifampin -ritonavir -St. John's Wort This list may not describe all possible interactions. Give your health care provider a list of all the medicines, herbs, non-prescription drugs, or dietary supplements you use. Also tell them if you smoke, drink alcohol, or use illegal drugs. Some items  may interact with your medicine. What should I watch for while using this medicine? Visit your doctor for checks on your progress. This drug may make you feel generally unwell. This is not uncommon, as chemotherapy can affect healthy cells as well as cancer cells. Report any side effects. Continue your course of treatment even though you feel ill unless your doctor tells you to stop. You may get drowsy or dizzy. Do not drive, use machinery, or do anything that needs mental alertness until you know how this medicine affects you. Do not stand or sit up quickly, especially if you are an older patient. This reduces the risk of dizzy or fainting spells. In some cases, you may be given additional medicines to help with side effects. Follow all directions for their use. Call your doctor or health care professional for advice if you get a fever, chills or sore throat, or other symptoms of a cold or flu. Do not treat yourself. This drug decreases your body's ability to fight infections. Try to avoid being around people who are sick. This medicine may increase your risk to bruise or bleed. Call your doctor or health care professional if you notice any unusual bleeding. You may need blood work done while you are taking this medicine. In some patients, this medicine may cause a serious brain infection that may cause death. If you have any problems seeing, thinking, speaking, walking, or standing, tell your doctor right away. If you cannot reach your doctor, urgently seek other source of medical care. Do not become pregnant while taking this medicine. Women should inform their doctor if they wish to become pregnant or think they might be pregnant. There is   a potential for serious side effects to an unborn child. Talk to your health care professional or pharmacist for more information. Do not breast-feed an infant while taking this medicine. Check with your doctor or health care professional if you get an attack of  severe diarrhea, nausea and vomiting, or if you sweat a lot. The loss of too much body fluid can make it dangerous for you to take this medicine. What side effects may I notice from receiving this medicine? Side effects that you should report to your doctor or health care professional as soon as possible: -allergic reactions like skin rash, itching or hives, swelling of the face, lips, or tongue -breathing problems -changes in hearing -changes in vision -fast, irregular heartbeat -feeling faint or lightheaded, falls -pain, tingling, numbness in the hands or feet -right upper belly pain -seizures -swelling of the ankles, feet, hands -unusual bleeding or bruising -unusually weak or tired -vomiting -yellowing of the eyes or skin Side effects that usually do not require medical attention (report to your doctor or health care professional if they continue or are bothersome): -changes in emotions or moods -constipation -diarrhea -loss of appetite -headache -irritation at site where injected -nausea This list may not describe all possible side effects. Call your doctor for medical advice about side effects. You may report side effects to FDA at 1-800-FDA-1088. Where should I keep my medicine? This drug is given in a hospital or clinic and will not be stored at home. NOTE: This sheet is a summary. It may not cover all possible information. If you have questions about this medicine, talk to your doctor, pharmacist, or health care provider.  2015, Elsevier/Gold Standard. (2013-01-29 12:46:32)  

## 2014-11-15 NOTE — Progress Notes (Signed)
11:25 AM OK to treat without labs per Dr. [name].

## 2014-11-22 ENCOUNTER — Ambulatory Visit (HOSPITAL_BASED_OUTPATIENT_CLINIC_OR_DEPARTMENT_OTHER): Payer: BLUE CROSS/BLUE SHIELD

## 2014-11-22 ENCOUNTER — Other Ambulatory Visit (HOSPITAL_BASED_OUTPATIENT_CLINIC_OR_DEPARTMENT_OTHER): Payer: BLUE CROSS/BLUE SHIELD

## 2014-11-22 DIAGNOSIS — Z5112 Encounter for antineoplastic immunotherapy: Secondary | ICD-10-CM

## 2014-11-22 DIAGNOSIS — C9000 Multiple myeloma not having achieved remission: Secondary | ICD-10-CM

## 2014-11-22 LAB — CMP (CANCER CENTER ONLY)
ALT(SGPT): 46 U/L (ref 10–47)
AST: 24 U/L (ref 11–38)
Albumin: 3.8 g/dL (ref 3.3–5.5)
Alkaline Phosphatase: 50 U/L (ref 26–84)
BUN, Bld: 16 mg/dL (ref 7–22)
CO2: 22 mEq/L (ref 18–33)
Calcium: 9.1 mg/dL (ref 8.0–10.3)
Chloride: 110 mEq/L — ABNORMAL HIGH (ref 98–108)
Creat: 1.1 mg/dl (ref 0.6–1.2)
Glucose, Bld: 111 mg/dL (ref 73–118)
Potassium: 4.2 mEq/L (ref 3.3–4.7)
Sodium: 139 mEq/L (ref 128–145)
Total Bilirubin: 1.1 mg/dl (ref 0.20–1.60)
Total Protein: 7.3 g/dL (ref 6.4–8.1)

## 2014-11-22 LAB — CBC WITH DIFFERENTIAL (CANCER CENTER ONLY)
BASO#: 0 10*3/uL (ref 0.0–0.2)
BASO%: 0 % (ref 0.0–2.0)
EOS%: 2.4 % (ref 0.0–7.0)
Eosinophils Absolute: 0.1 10*3/uL (ref 0.0–0.5)
HCT: 35.6 % — ABNORMAL LOW (ref 38.7–49.9)
HGB: 12.5 g/dL — ABNORMAL LOW (ref 13.0–17.1)
LYMPH#: 0.7 10*3/uL — ABNORMAL LOW (ref 0.9–3.3)
LYMPH%: 15.4 % (ref 14.0–48.0)
MCH: 32.7 pg (ref 28.0–33.4)
MCHC: 35.1 g/dL (ref 32.0–35.9)
MCV: 93 fL (ref 82–98)
MONO#: 0.3 10*3/uL (ref 0.1–0.9)
MONO%: 7 % (ref 0.0–13.0)
NEUT#: 3.4 10*3/uL (ref 1.5–6.5)
NEUT%: 75.2 % (ref 40.0–80.0)
Platelets: 101 10*3/uL — ABNORMAL LOW (ref 145–400)
RBC: 3.82 10*6/uL — ABNORMAL LOW (ref 4.20–5.70)
RDW: 13.5 % (ref 11.1–15.7)
WBC: 4.5 10*3/uL (ref 4.0–10.0)

## 2014-11-22 MED ORDER — ZOLEDRONIC ACID 4 MG/100ML IV SOLN
4.0000 mg | Freq: Once | INTRAVENOUS | Status: AC
  Administered 2014-11-22: 4 mg via INTRAVENOUS
  Filled 2014-11-22: qty 100

## 2014-11-22 MED ORDER — BORTEZOMIB CHEMO SQ INJECTION 3.5 MG (2.5MG/ML)
1.3000 mg/m2 | Freq: Once | INTRAMUSCULAR | Status: AC
  Administered 2014-11-22: 2.75 mg via SUBCUTANEOUS
  Filled 2014-11-22: qty 2.75

## 2014-11-22 MED ORDER — ONDANSETRON HCL 8 MG PO TABS: 8.0000 mg | ORAL_TABLET | Freq: Once | ORAL | Status: DC

## 2014-11-28 ENCOUNTER — Other Ambulatory Visit: Payer: Self-pay | Admitting: *Deleted

## 2014-11-28 DIAGNOSIS — C9000 Multiple myeloma not having achieved remission: Secondary | ICD-10-CM

## 2014-11-28 MED ORDER — LENALIDOMIDE 25 MG PO CAPS: 25.0000 mg | ORAL_CAPSULE | Freq: Every day | ORAL | Status: DC

## 2014-12-05 ENCOUNTER — Telehealth: Payer: Self-pay | Admitting: Hematology & Oncology

## 2014-12-05 NOTE — Telephone Encounter (Signed)
Faxed medical records to:  [facility] F: [id number] P: [phone] [id number] Insured ID: [id number] Policy: [id number]     COPY SCANNED

## 2014-12-06 ENCOUNTER — Encounter: Payer: Self-pay | Admitting: Hematology & Oncology

## 2014-12-06 ENCOUNTER — Ambulatory Visit (HOSPITAL_BASED_OUTPATIENT_CLINIC_OR_DEPARTMENT_OTHER): Payer: BLUE CROSS/BLUE SHIELD | Admitting: Hematology & Oncology

## 2014-12-06 ENCOUNTER — Other Ambulatory Visit (HOSPITAL_BASED_OUTPATIENT_CLINIC_OR_DEPARTMENT_OTHER): Payer: BLUE CROSS/BLUE SHIELD

## 2014-12-06 ENCOUNTER — Ambulatory Visit (HOSPITAL_BASED_OUTPATIENT_CLINIC_OR_DEPARTMENT_OTHER): Payer: BLUE CROSS/BLUE SHIELD

## 2014-12-06 VITALS — BP 130/78 | HR 60 | Temp 97.4°F | Resp 16 | Ht 72.0 in | Wt 211.0 lb

## 2014-12-06 DIAGNOSIS — C9000 Multiple myeloma not having achieved remission: Secondary | ICD-10-CM

## 2014-12-06 DIAGNOSIS — Z5111 Encounter for antineoplastic chemotherapy: Secondary | ICD-10-CM

## 2014-12-06 LAB — CBC WITH DIFFERENTIAL (CANCER CENTER ONLY)
BASO#: 0 10*3/uL (ref 0.0–0.2)
BASO%: 0.8 % (ref 0.0–2.0)
EOS%: 1.2 % (ref 0.0–7.0)
Eosinophils Absolute: 0 10*3/uL (ref 0.0–0.5)
HCT: 32.6 % — ABNORMAL LOW (ref 38.7–49.9)
HGB: 11.6 g/dL — ABNORMAL LOW (ref 13.0–17.1)
LYMPH#: 0.8 10*3/uL — ABNORMAL LOW (ref 0.9–3.3)
LYMPH%: 33.1 % (ref 14.0–48.0)
MCH: 33.3 pg (ref 28.0–33.4)
MCHC: 35.6 g/dL (ref 32.0–35.9)
MCV: 94 fL (ref 82–98)
MONO#: 0.3 10*3/uL (ref 0.1–0.9)
MONO%: 14 % — ABNORMAL HIGH (ref 0.0–13.0)
NEUT#: 1.2 10*3/uL — ABNORMAL LOW (ref 1.5–6.5)
NEUT%: 50.9 % (ref 40.0–80.0)
Platelets: 177 10*3/uL (ref 145–400)
RBC: 3.48 10*6/uL — ABNORMAL LOW (ref 4.20–5.70)
RDW: 13.9 % (ref 11.1–15.7)
WBC: 2.4 10*3/uL — ABNORMAL LOW (ref 4.0–10.0)

## 2014-12-06 LAB — CMP (CANCER CENTER ONLY)
ALT(SGPT): 36 U/L (ref 10–47)
AST: 25 U/L (ref 11–38)
Albumin: 3.5 g/dL (ref 3.3–5.5)
Alkaline Phosphatase: 54 U/L (ref 26–84)
BUN, Bld: 12 mg/dL (ref 7–22)
CO2: 20 mEq/L (ref 18–33)
Calcium: 8.1 mg/dL (ref 8.0–10.3)
Chloride: 107 mEq/L (ref 98–108)
Creat: 1.1 mg/dl (ref 0.6–1.2)
Glucose, Bld: 102 mg/dL (ref 73–118)
Potassium: 4.2 mEq/L (ref 3.3–4.7)
Sodium: 136 mEq/L (ref 128–145)
Total Bilirubin: 0.7 mg/dl (ref 0.20–1.60)
Total Protein: 7 g/dL (ref 6.4–8.1)

## 2014-12-06 MED ORDER — BORTEZOMIB CHEMO SQ INJECTION 3.5 MG (2.5MG/ML)
1.3000 mg/m2 | Freq: Once | INTRAMUSCULAR | Status: AC
  Administered 2014-12-06: 2.75 mg via SUBCUTANEOUS
  Filled 2014-12-06: qty 2.75

## 2014-12-06 NOTE — Progress Notes (Signed)
Hematology and Oncology Follow Up Visit  Lum Stillinger [id number] 11-18-1965 49 y.o. 12/06/2014   Principle Diagnosis:   IgG Kappa myeloma  Current Therapy:    S/p Cycle #4 of RVD  Zometa 4 mg IV every month     Interim History:  [name] is back for follow-up. He  continues to do quite well. He is more active. He is doing more things around his property. He and his wife and their family had a great vacation up in [place]. They are in crumbly tan.  His myeloma studies have stabilized a little bit. Back in July, his monoclonal spike was 1.5 g/dL. His IgG was 1990 mg/dL. He Kappa light chain was 3.19 mg/dL. His beta-2 microglobulin was 2.93 mg/dL.   We've not had to transfuse him now for about 4 months. This, to me, means that the treatments are working and that the myeloma is being cleaned out of his bone marrow.  He's had no fever. He's had no rashes.  He continues with his aspirin.  He does get a little bit of constipation after treatment. Lactulose does seem to help this.  He's not noted any cough. He still has some shortness of breath.  We are checking his myeloma studies today. I would have to suspect that they are continuing to improve.   Overall, his performance status is ECOG 1.  Medications:  Current outpatient prescriptions:  .  aspirin 325 MG EC tablet, Take 325 mg by mouth daily., Disp: , Rfl:  .  calcium carbonate (TUMS - DOSED IN MG ELEMENTAL CALCIUM) 500 MG chewable tablet, Chew 1 tablet by mouth 2 (two) times daily., Disp: , Rfl:  .  dexamethasone (DECADRON) 4 MG tablet, TAKE 5 TABLET AT ONE TIME WITH FOOD FOR 3 WEEKS THEN OFF ONE WEEK *INS ALLOWS 30 DAYS, Disp: 60 tablet, Rfl: 1 .  famciclovir (FAMVIR) 500 MG tablet, Take 1 tablet (500 mg total) by mouth daily., Disp: 30 tablet, Rfl: 6 .  Lactulose 20 GM/30ML SOLN, Take 30 mLs (20 g total) by mouth 4 (four) times daily. (Patient taking differently: Take 30 mLs by mouth as needed. ), Disp: 1000 mL, Rfl: 5 .   lenalidomide (REVLIMID) 25 MG capsule, Take 1 capsule (25 mg total) by mouth daily. Take for 21 days and 7 days off. Auth# [id number], Disp: 21 capsule, Rfl: 0 .  LORazepam (ATIVAN) 0.5 MG tablet, Take 1 tablet (0.5 mg total) by mouth every 6 (six) hours as needed (Nausea or vomiting)., Disp: 60 tablet, Rfl: 0 .  prochlorperazine (COMPAZINE) 10 MG tablet, Take 1 tablet (10 mg total) by mouth every 6 (six) hours as needed (Nausea or vomiting)., Disp: 30 tablet, Rfl: 6 .  zolpidem (AMBIEN) 10 MG tablet, Take 10 mg by mouth at bedtime as needed. , Disp: , Rfl: 0  Allergies: No Known Allergies  Past Medical History, Surgical history, Social history, and Family History were reviewed and updated.  Review of Systems: As above  Physical Exam:  height is 6' (1.829 m) and weight is 211 lb (95.709 kg). His oral temperature is 97.4 F (36.3 C). His blood pressure is 130/78 and his pulse is 60. His respiration is 16.   Wt Readings from Last 3 Encounters:  12/06/14 211 lb (95.709 kg)  11/08/14 209 lb (94.802 kg)  10/18/14 209 lb (94.802 kg)     Well-developed and well-nourished white chum in no obvious distress. Head and neck exam shows no ocular or oral lesions. There are  no palpable cervical or supraclavicular lymph nodes. Lungs are clear. Cardiac exam regular rate and rhythm with no murmurs, rubs or bruits. Abdomen is soft. He has good bowel sounds. There is no fluid wave. There is no palpable hepatomegaly. His spleen tip might be palpable at the left costal margin. Extremities shows no clubbing, cyanosis or edema. He may have some trace edema in his lower legs. Skin exam shows no rashes, ecchymoses or petechia. Neurological exam is nonfocal.  Lab Results  Component Value Date   WBC 2.4* 12/06/2014   HGB 11.6* 12/06/2014   HCT 32.6* 12/06/2014   MCV 94 12/06/2014   PLT 177 12/06/2014     Chemistry      Component Value Date/Time   NA 136 12/06/2014 0746   NA 137 10/25/2014 1103   K 4.2  12/06/2014 0746   K 5.2 10/25/2014 1103   CL 107 12/06/2014 0746   CL 105 10/25/2014 1103   CO2 20 12/06/2014 0746   CO2 21 10/25/2014 1103   BUN 12 12/06/2014 0746   BUN 18 10/25/2014 1103   CREATININE 1.1 12/06/2014 0746   CREATININE 1.15 10/25/2014 1103      Component Value Date/Time   CALCIUM 8.1 12/06/2014 0746   CALCIUM 8.5 10/25/2014 1103   ALKPHOS 54 12/06/2014 0746   ALKPHOS 68 10/25/2014 1103   AST 25 12/06/2014 0746   AST 23 10/25/2014 1103   ALT 36 12/06/2014 0746   ALT 53 10/25/2014 1103   BILITOT 0.70 12/06/2014 0746   BILITOT 0.6 10/25/2014 1103         Impression and Plan: [name] is 49 year old gentleman with IgG Kappa myeloma. He has extensive marrow involvement. His beta-2 microglobulin was almost 19. This by the highest that seen for a patient with myeloma.  Again, I know that he is responding. His last beta-2 microglobulin was down at 2.93. His last M spike was down to 1.5.  We will get his myeloma levels that were drawn today back next week and we will see what they look like.  I spent about 40 minutes with he and his wife. He had quite a few questions. I answered his questions.  I  talked to them about transplant. I went over some of the issues with transplant. I again reinforced the fact that I thought the transplant would be beneficial.  I will plan to see him back myself in one month.  It is hard to say how much longer we'll have to treat him. I think that as long as his myeloma levels are responding, we have to continue with therapy.   [name], MD 8/19/20163:42 PM

## 2014-12-06 NOTE — Patient Instructions (Signed)
Bortezomib injection What is this medicine? BORTEZOMIB (bor TEZ oh mib) is a chemotherapy drug. It slows the growth of cancer cells. This medicine is used to treat multiple myeloma, and certain lymphomas, such as mantle-cell lymphoma. This medicine may be used for other purposes; ask your health care provider or pharmacist if you have questions. COMMON BRAND NAME(S): Velcade What should I tell my health care provider before I take this medicine? They need to know if you have any of these conditions: -diabetes -heart disease -irregular heartbeat -liver disease -on hemodialysis -low blood counts, like low white blood cells, platelets, or hemoglobin -peripheral neuropathy -taking medicine for blood pressure -an unusual or allergic reaction to bortezomib, mannitol, boron, other medicines, foods, dyes, or preservatives -pregnant or trying to get pregnant -breast-feeding How should I use this medicine? This medicine is for injection into a vein or for injection under the skin. It is given by a health care professional in a hospital or clinic setting. Talk to your pediatrician regarding the use of this medicine in children. Special care may be needed. Overdosage: If you think you have taken too much of this medicine contact a poison control center or emergency room at once. NOTE: This medicine is only for you. Do not share this medicine with others. What if I miss a dose? It is important not to miss your dose. Call your doctor or health care professional if you are unable to keep an appointment. What may interact with this medicine? This medicine may interact with the following medications: -ketoconazole -rifampin -ritonavir -St. John's Wort This list may not describe all possible interactions. Give your health care provider a list of all the medicines, herbs, non-prescription drugs, or dietary supplements you use. Also tell them if you smoke, drink alcohol, or use illegal drugs. Some items  may interact with your medicine. What should I watch for while using this medicine? Visit your doctor for checks on your progress. This drug may make you feel generally unwell. This is not uncommon, as chemotherapy can affect healthy cells as well as cancer cells. Report any side effects. Continue your course of treatment even though you feel ill unless your doctor tells you to stop. You may get drowsy or dizzy. Do not drive, use machinery, or do anything that needs mental alertness until you know how this medicine affects you. Do not stand or sit up quickly, especially if you are an older patient. This reduces the risk of dizzy or fainting spells. In some cases, you may be given additional medicines to help with side effects. Follow all directions for their use. Call your doctor or health care professional for advice if you get a fever, chills or sore throat, or other symptoms of a cold or flu. Do not treat yourself. This drug decreases your body's ability to fight infections. Try to avoid being around people who are sick. This medicine may increase your risk to bruise or bleed. Call your doctor or health care professional if you notice any unusual bleeding. You may need blood work done while you are taking this medicine. In some patients, this medicine may cause a serious brain infection that may cause death. If you have any problems seeing, thinking, speaking, walking, or standing, tell your doctor right away. If you cannot reach your doctor, urgently seek other source of medical care. Do not become pregnant while taking this medicine. Women should inform their doctor if they wish to become pregnant or think they might be pregnant. There is   a potential for serious side effects to an unborn child. Talk to your health care professional or pharmacist for more information. Do not breast-feed an infant while taking this medicine. Check with your doctor or health care professional if you get an attack of  severe diarrhea, nausea and vomiting, or if you sweat a lot. The loss of too much body fluid can make it dangerous for you to take this medicine. What side effects may I notice from receiving this medicine? Side effects that you should report to your doctor or health care professional as soon as possible: -allergic reactions like skin rash, itching or hives, swelling of the face, lips, or tongue -breathing problems -changes in hearing -changes in vision -fast, irregular heartbeat -feeling faint or lightheaded, falls -pain, tingling, numbness in the hands or feet -right upper belly pain -seizures -swelling of the ankles, feet, hands -unusual bleeding or bruising -unusually weak or tired -vomiting -yellowing of the eyes or skin Side effects that usually do not require medical attention (report to your doctor or health care professional if they continue or are bothersome): -changes in emotions or moods -constipation -diarrhea -loss of appetite -headache -irritation at site where injected -nausea This list may not describe all possible side effects. Call your doctor for medical advice about side effects. You may report side effects to FDA at 1-800-FDA-1088. Where should I keep my medicine? This drug is given in a hospital or clinic and will not be stored at home. NOTE: This sheet is a summary. It may not cover all possible information. If you have questions about this medicine, talk to your doctor, pharmacist, or health care provider.  2015, Elsevier/Gold Standard. (2013-01-29 12:46:32)  

## 2014-12-09 ENCOUNTER — Telehealth: Payer: Self-pay | Admitting: Hematology & Oncology

## 2014-12-09 NOTE — Telephone Encounter (Signed)
Spoke with pt regarding moving appt from 8/26 to 8/25. Pt confirm appt

## 2014-12-10 LAB — KAPPA/LAMBDA LIGHT CHAINS
Kappa free light chain: 3.65 mg/dL — ABNORMAL HIGH (ref 0.33–1.94)
Kappa:Lambda Ratio: 3.84 — ABNORMAL HIGH (ref 0.26–1.65)
Lambda Free Lght Chn: 0.95 mg/dL (ref 0.57–2.63)

## 2014-12-10 LAB — SPEP & IFE WITH QIG
Abnormal Protein Band1: 1.2 g/dL
Albumin ELP: 3.7 g/dL — ABNORMAL LOW (ref 3.8–4.8)
Alpha-1-Globulin: 0.3 g/dL (ref 0.2–0.3)
Alpha-2-Globulin: 0.6 g/dL (ref 0.5–0.9)
Beta 2: 0.2 g/dL (ref 0.2–0.5)
Beta Globulin: 0.3 g/dL — ABNORMAL LOW (ref 0.4–0.6)
Gamma Globulin: 1.5 g/dL (ref 0.8–1.7)
IgA: 45 mg/dL — ABNORMAL LOW (ref 68–379)
IgG (Immunoglobin G), Serum: 1630 mg/dL — ABNORMAL HIGH (ref 650–1600)
IgM, Serum: 30 mg/dL — ABNORMAL LOW (ref 41–251)
Total Protein, Serum Electrophoresis: 6.6 g/dL (ref 6.1–8.1)

## 2014-12-11 ENCOUNTER — Telehealth: Payer: Self-pay | Admitting: Hematology & Oncology

## 2014-12-11 ENCOUNTER — Telehealth: Payer: Self-pay | Admitting: *Deleted

## 2014-12-11 NOTE — Telephone Encounter (Addendum)
Patient aware of results  ----- Message from [name], MD sent at 12/10/2014  5:57 PM EDT ----- Call - myeloma protein now down to 1.2g/dl!!!  We are getting there!!!  pete

## 2014-12-11 NOTE — Telephone Encounter (Signed)
Returned pts call and changed time to 8 on 8/25

## 2014-12-12 ENCOUNTER — Ambulatory Visit (HOSPITAL_BASED_OUTPATIENT_CLINIC_OR_DEPARTMENT_OTHER): Payer: BLUE CROSS/BLUE SHIELD

## 2014-12-12 ENCOUNTER — Telehealth: Payer: Self-pay | Admitting: Hematology & Oncology

## 2014-12-12 VITALS — BP 135/72 | HR 57 | Temp 98.3°F | Resp 18

## 2014-12-12 DIAGNOSIS — C9000 Multiple myeloma not having achieved remission: Secondary | ICD-10-CM

## 2014-12-12 DIAGNOSIS — Z5112 Encounter for antineoplastic immunotherapy: Secondary | ICD-10-CM | POA: Diagnosis not present

## 2014-12-12 MED ORDER — BORTEZOMIB CHEMO SQ INJECTION 3.5 MG (2.5MG/ML)
1.3000 mg/m2 | Freq: Once | INTRAMUSCULAR | Status: AC
  Administered 2014-12-12: 2.75 mg via SUBCUTANEOUS
  Filled 2014-12-12: qty 2.75

## 2014-12-12 MED ORDER — ONDANSETRON HCL 8 MG PO TABS: 8.0000 mg | ORAL_TABLET | Freq: Once | ORAL | Status: DC

## 2014-12-12 NOTE — Telephone Encounter (Signed)
Faxed medical records to :  [facility] P: [phone] F: [phone]   Case: [id number]    COPY SCANNED

## 2014-12-12 NOTE — Patient Instructions (Signed)
[facility] Discharge Instructions for Patients Receiving Chemotherapy  Today you received the following chemotherapy agents:  Velcade  To help prevent nausea and vomiting after your treatment, we encourage you to take your nausea medication as prescribed.   If you develop nausea and vomiting that is not controlled by your nausea medication, call the clinic.   BELOW ARE SYMPTOMS THAT SHOULD BE REPORTED IMMEDIATELY:  *FEVER GREATER THAN 100.5 F  *CHILLS WITH OR WITHOUT FEVER  NAUSEA AND VOMITING THAT IS NOT CONTROLLED WITH YOUR NAUSEA MEDICATION  *UNUSUAL SHORTNESS OF BREATH  *UNUSUAL BRUISING OR BLEEDING  TENDERNESS IN MOUTH AND THROAT WITH OR WITHOUT PRESENCE OF ULCERS  *URINARY PROBLEMS  *BOWEL PROBLEMS  UNUSUAL RASH Items with * indicate a potential emergency and should be followed up as soon as possible.  Feel free to call the clinic you have any questions or concerns. The clinic phone number is [phone].  Please show the CHEMO ALERT CARD at check-in to the Emergency Department and triage nurse.   

## 2014-12-13 ENCOUNTER — Inpatient Hospital Stay: Payer: BLUE CROSS/BLUE SHIELD

## 2014-12-13 ENCOUNTER — Other Ambulatory Visit: Payer: BLUE CROSS/BLUE SHIELD

## 2014-12-19 ENCOUNTER — Ambulatory Visit (HOSPITAL_BASED_OUTPATIENT_CLINIC_OR_DEPARTMENT_OTHER): Payer: BLUE CROSS/BLUE SHIELD

## 2014-12-19 VITALS — BP 133/68 | HR 58 | Temp 97.8°F | Resp 18

## 2014-12-19 DIAGNOSIS — C9000 Multiple myeloma not having achieved remission: Secondary | ICD-10-CM

## 2014-12-19 DIAGNOSIS — Z5112 Encounter for antineoplastic immunotherapy: Secondary | ICD-10-CM

## 2014-12-19 MED ORDER — ZOLEDRONIC ACID 4 MG/100ML IV SOLN
4.0000 mg | Freq: Once | INTRAVENOUS | Status: AC
  Administered 2014-12-19: 4 mg via INTRAVENOUS
  Filled 2014-12-19: qty 100

## 2014-12-19 MED ORDER — BORTEZOMIB CHEMO SQ INJECTION 3.5 MG (2.5MG/ML)
1.3000 mg/m2 | Freq: Once | INTRAMUSCULAR | Status: AC
  Administered 2014-12-19: 2.75 mg via SUBCUTANEOUS
  Filled 2014-12-19: qty 2.75

## 2014-12-19 NOTE — Patient Instructions (Signed)
[facility] Discharge Instructions for Patients Receiving Chemotherapy  Today you received the following chemotherapy agents Zometa and Velcade   To help prevent nausea and vomiting after your treatment, we encourage you to take your nausea medication as prescribed.    If you develop nausea and vomiting that is not controlled by your nausea medication, call the clinic.   BELOW ARE SYMPTOMS THAT SHOULD BE REPORTED IMMEDIATELY:  *FEVER GREATER THAN 100.5 F  *CHILLS WITH OR WITHOUT FEVER  NAUSEA AND VOMITING THAT IS NOT CONTROLLED WITH YOUR NAUSEA MEDICATION  *UNUSUAL SHORTNESS OF BREATH  *UNUSUAL BRUISING OR BLEEDING  TENDERNESS IN MOUTH AND THROAT WITH OR WITHOUT PRESENCE OF ULCERS  *URINARY PROBLEMS  *BOWEL PROBLEMS  UNUSUAL RASH Items with * indicate a potential emergency and should be followed up as soon as possible.  Feel free to call the clinic you have any questions or concerns. The clinic phone number is [phone].  Please show the [place] at check-in to the Emergency Department and triage nurse.

## 2014-12-20 ENCOUNTER — Ambulatory Visit: Payer: BLUE CROSS/BLUE SHIELD | Admitting: Hematology & Oncology

## 2014-12-20 ENCOUNTER — Inpatient Hospital Stay: Payer: BLUE CROSS/BLUE SHIELD

## 2014-12-20 ENCOUNTER — Other Ambulatory Visit: Payer: BLUE CROSS/BLUE SHIELD

## 2014-12-24 ENCOUNTER — Other Ambulatory Visit: Payer: Self-pay | Admitting: *Deleted

## 2014-12-24 DIAGNOSIS — C9000 Multiple myeloma not having achieved remission: Secondary | ICD-10-CM

## 2014-12-24 MED ORDER — LENALIDOMIDE 25 MG PO CAPS: 25.0000 mg | ORAL_CAPSULE | Freq: Every day | ORAL | Status: DC

## 2015-01-03 ENCOUNTER — Ambulatory Visit (HOSPITAL_BASED_OUTPATIENT_CLINIC_OR_DEPARTMENT_OTHER): Payer: BLUE CROSS/BLUE SHIELD | Admitting: Hematology & Oncology

## 2015-01-03 ENCOUNTER — Ambulatory Visit (HOSPITAL_BASED_OUTPATIENT_CLINIC_OR_DEPARTMENT_OTHER): Payer: BLUE CROSS/BLUE SHIELD

## 2015-01-03 ENCOUNTER — Encounter: Payer: Self-pay | Admitting: Hematology & Oncology

## 2015-01-03 VITALS — BP 145/74 | HR 62 | Temp 97.8°F | Resp 16 | Ht 72.0 in | Wt 217.0 lb

## 2015-01-03 DIAGNOSIS — Z5112 Encounter for antineoplastic immunotherapy: Secondary | ICD-10-CM | POA: Diagnosis not present

## 2015-01-03 DIAGNOSIS — C9000 Multiple myeloma not having achieved remission: Secondary | ICD-10-CM | POA: Diagnosis not present

## 2015-01-03 LAB — CBC WITH DIFFERENTIAL (CANCER CENTER ONLY)
BASO#: 0 10*3/uL (ref 0.0–0.2)
BASO%: 0.6 % (ref 0.0–2.0)
EOS%: 0.3 % (ref 0.0–7.0)
Eosinophils Absolute: 0 10*3/uL (ref 0.0–0.5)
HCT: 34.1 % — ABNORMAL LOW (ref 38.7–49.9)
HGB: 11.8 g/dL — ABNORMAL LOW (ref 13.0–17.1)
LYMPH#: 0.5 10*3/uL — ABNORMAL LOW (ref 0.9–3.3)
LYMPH%: 16.3 % (ref 14.0–48.0)
MCH: 33.3 pg (ref 28.0–33.4)
MCHC: 34.6 g/dL (ref 32.0–35.9)
MCV: 96 fL (ref 82–98)
MONO#: 0.1 10*3/uL (ref 0.1–0.9)
MONO%: 2.2 % (ref 0.0–13.0)
NEUT#: 2.6 10*3/uL (ref 1.5–6.5)
NEUT%: 80.6 % — ABNORMAL HIGH (ref 40.0–80.0)
Platelets: 206 10*3/uL (ref 145–400)
RBC: 3.54 10*6/uL — ABNORMAL LOW (ref 4.20–5.70)
RDW: 14.9 % (ref 11.1–15.7)
WBC: 3.2 10*3/uL — ABNORMAL LOW (ref 4.0–10.0)

## 2015-01-03 LAB — CMP (CANCER CENTER ONLY)
ALT(SGPT): 38 U/L (ref 10–47)
AST: 27 U/L (ref 11–38)
Albumin: 3.7 g/dL (ref 3.3–5.5)
Alkaline Phosphatase: 47 U/L (ref 26–84)
BUN, Bld: 15 mg/dL (ref 7–22)
CO2: 26 mEq/L (ref 18–33)
Calcium: 8.8 mg/dL (ref 8.0–10.3)
Chloride: 105 mEq/L (ref 98–108)
Creat: 1.1 mg/dl (ref 0.6–1.2)
Glucose, Bld: 153 mg/dL — ABNORMAL HIGH (ref 73–118)
Potassium: 4.8 mEq/L — ABNORMAL HIGH (ref 3.3–4.7)
Sodium: 137 mEq/L (ref 128–145)
Total Bilirubin: 0.8 mg/dl (ref 0.20–1.60)
Total Protein: 7.2 g/dL (ref 6.4–8.1)

## 2015-01-03 MED ORDER — BORTEZOMIB CHEMO SQ INJECTION 3.5 MG (2.5MG/ML)
1.3000 mg/m2 | Freq: Once | INTRAMUSCULAR | Status: AC
  Administered 2015-01-03: 2.75 mg via SUBCUTANEOUS
  Filled 2015-01-03: qty 2.75

## 2015-01-03 MED ORDER — ONDANSETRON HCL 8 MG PO TABS: 8.0000 mg | ORAL_TABLET | Freq: Once | ORAL | Status: DC

## 2015-01-03 MED ORDER — ONDANSETRON HCL 8 MG PO TABS
ORAL_TABLET | ORAL | Status: AC
  Filled 2015-01-03: qty 1

## 2015-01-03 NOTE — Patient Instructions (Signed)
Bortezomib injection What is this medicine? BORTEZOMIB (bor TEZ oh mib) is a chemotherapy drug. It slows the growth of cancer cells. This medicine is used to treat multiple myeloma, and certain lymphomas, such as mantle-cell lymphoma. This medicine may be used for other purposes; ask your health care provider or pharmacist if you have questions. COMMON BRAND NAME(S): Velcade What should I tell my health care provider before I take this medicine? They need to know if you have any of these conditions: -diabetes -heart disease -irregular heartbeat -liver disease -on hemodialysis -low blood counts, like low white blood cells, platelets, or hemoglobin -peripheral neuropathy -taking medicine for blood pressure -an unusual or allergic reaction to bortezomib, mannitol, boron, other medicines, foods, dyes, or preservatives -pregnant or trying to get pregnant -breast-feeding How should I use this medicine? This medicine is for injection into a vein or for injection under the skin. It is given by a health care professional in a hospital or clinic setting. Talk to your pediatrician regarding the use of this medicine in children. Special care may be needed. Overdosage: If you think you have taken too much of this medicine contact a poison control center or emergency room at once. NOTE: This medicine is only for you. Do not share this medicine with others. What if I miss a dose? It is important not to miss your dose. Call your doctor or health care professional if you are unable to keep an appointment. What may interact with this medicine? This medicine may interact with the following medications: -ketoconazole -rifampin -ritonavir -St. John's Wort This list may not describe all possible interactions. Give your health care provider a list of all the medicines, herbs, non-prescription drugs, or dietary supplements you use. Also tell them if you smoke, drink alcohol, or use illegal drugs. Some items  may interact with your medicine. What should I watch for while using this medicine? Visit your doctor for checks on your progress. This drug may make you feel generally unwell. This is not uncommon, as chemotherapy can affect healthy cells as well as cancer cells. Report any side effects. Continue your course of treatment even though you feel ill unless your doctor tells you to stop. You may get drowsy or dizzy. Do not drive, use machinery, or do anything that needs mental alertness until you know how this medicine affects you. Do not stand or sit up quickly, especially if you are an older patient. This reduces the risk of dizzy or fainting spells. In some cases, you may be given additional medicines to help with side effects. Follow all directions for their use. Call your doctor or health care professional for advice if you get a fever, chills or sore throat, or other symptoms of a cold or flu. Do not treat yourself. This drug decreases your body's ability to fight infections. Try to avoid being around people who are sick. This medicine may increase your risk to bruise or bleed. Call your doctor or health care professional if you notice any unusual bleeding. You may need blood work done while you are taking this medicine. In some patients, this medicine may cause a serious brain infection that may cause death. If you have any problems seeing, thinking, speaking, walking, or standing, tell your doctor right away. If you cannot reach your doctor, urgently seek other source of medical care. Do not become pregnant while taking this medicine. Women should inform their doctor if they wish to become pregnant or think they might be pregnant. There is   a potential for serious side effects to an unborn child. Talk to your health care professional or pharmacist for more information. Do not breast-feed an infant while taking this medicine. Check with your doctor or health care professional if you get an attack of  severe diarrhea, nausea and vomiting, or if you sweat a lot. The loss of too much body fluid can make it dangerous for you to take this medicine. What side effects may I notice from receiving this medicine? Side effects that you should report to your doctor or health care professional as soon as possible: -allergic reactions like skin rash, itching or hives, swelling of the face, lips, or tongue -breathing problems -changes in hearing -changes in vision -fast, irregular heartbeat -feeling faint or lightheaded, falls -pain, tingling, numbness in the hands or feet -right upper belly pain -seizures -swelling of the ankles, feet, hands -unusual bleeding or bruising -unusually weak or tired -vomiting -yellowing of the eyes or skin Side effects that usually do not require medical attention (report to your doctor or health care professional if they continue or are bothersome): -changes in emotions or moods -constipation -diarrhea -loss of appetite -headache -irritation at site where injected -nausea This list may not describe all possible side effects. Call your doctor for medical advice about side effects. You may report side effects to FDA at 1-800-FDA-1088. Where should I keep my medicine? This drug is given in a hospital or clinic and will not be stored at home. NOTE: This sheet is a summary. It may not cover all possible information. If you have questions about this medicine, talk to your doctor, pharmacist, or health care provider.  2015, Elsevier/Gold Standard. (2013-01-29 12:46:32)  

## 2015-01-03 NOTE — Progress Notes (Signed)
Hematology and Oncology Follow Up Visit  Tyler Phillips [id number] [birth year] 49 y.o. 01/03/2015   Principle Diagnosis:   IgG Kappa myeloma  Current Therapy:    S/p Cycle #5 of RVD  Zometa 4 mg IV every month     Interim History:  [name] is back for follow-up. He  continues to do quite well. Unfortunately, his wife just had surgery on her right knee. She comes in with a brace on. She is doing physical therapy.  He does come back from [place]. He was up at their beach house. He is able to go up and she was able to stay down here as she has a lot of help from family and friends. His gout that he could get away a little bit and be able to "unwind" from all of his treatments.  His last myeloma studies showed continued response. His M spike was 1.2 g/dL. His IgG level was 1630 mg/dL. His Kappa Lightchain with 3.65 mg/dL.  He's had a good appetite. He's had no problems with bowels or bladder. He's had no leg swelling.  He continues on aspirin.  He's had no cough. He's had no shortness of breath.    Overall, his performance status is ECOG 1.  Medications:  Current outpatient prescriptions:  .  aspirin 325 MG EC tablet, Take 325 mg by mouth daily., Disp: , Rfl:  .  calcium carbonate (TUMS - DOSED IN MG ELEMENTAL CALCIUM) 500 MG chewable tablet, Chew 1 tablet by mouth 2 (two) times daily., Disp: , Rfl:  .  dexamethasone (DECADRON) 4 MG tablet, TAKE 5 TABLET AT ONE TIME WITH FOOD FOR 3 WEEKS THEN OFF ONE WEEK *INS ALLOWS 30 DAYS, Disp: 60 tablet, Rfl: 1 .  famciclovir (FAMVIR) 500 MG tablet, Take 1 tablet (500 mg total) by mouth daily., Disp: 30 tablet, Rfl: 6 .  Lactulose 20 GM/30ML SOLN, Take 30 mLs (20 g total) by mouth 4 (four) times daily. (Patient taking differently: Take 30 mLs by mouth as needed. ), Disp: 1000 mL, Rfl: 5 .  lenalidomide (REVLIMID) 25 MG capsule, Take 1 capsule (25 mg total) by mouth daily. Take for 21 days and 7 days off. HWEX#[id number], Disp: 21 capsule, Rfl:  0 .  LORazepam (ATIVAN) 0.5 MG tablet, Take 1 tablet (0.5 mg total) by mouth every 6 (six) hours as needed (Nausea or vomiting)., Disp: 60 tablet, Rfl: 0 .  prochlorperazine (COMPAZINE) 10 MG tablet, Take 1 tablet (10 mg total) by mouth every 6 (six) hours as needed (Nausea or vomiting)., Disp: 30 tablet, Rfl: 6 .  zolpidem (AMBIEN) 10 MG tablet, Take 10 mg by mouth at bedtime as needed. , Disp: , Rfl: 0 No current facility-administered medications for this visit.  Facility-Administered Medications Ordered in Other Visits:  .  bortezomib SQ (VELCADE) chemo injection 2.75 mg, 1.3 mg/m2 (Treatment Plan Actual), Subcutaneous, Once, [name], MD .  ondansetron [facility]) tablet 8 mg, 8 mg, Oral, Once, [name], MD  Allergies: No Known Allergies  Past Medical History, Surgical history, Social history, and Family History were reviewed and updated.  Review of Systems: As above  Physical Exam:  height is 6' (1.829 m) and weight is 217 lb (98.431 kg). His oral temperature is 97.8 F (36.6 C). His blood pressure is 145/74 and his pulse is 62. His respiration is 16.   Wt Readings from Last 3 Encounters:  01/03/15 217 lb (98.431 kg)  12/06/14 211 lb (95.709 kg)  11/08/14 209 lb (  94.802 kg)     Well-developed and well-nourished white chum in no obvious distress. Head and neck exam shows no ocular or oral lesions. There are no palpable cervical or supraclavicular lymph nodes. Lungs are clear. Cardiac exam regular rate and rhythm with no murmurs, rubs or bruits. Abdomen is soft. He has good bowel sounds. There is no fluid wave. There is no palpable hepatomegaly. His spleen tip might be palpable at the left costal margin. Extremities shows no clubbing, cyanosis or edema. He may have some trace edema in his lower legs. Skin exam shows no rashes, ecchymoses or petechia. Neurological exam is nonfocal.  Lab Results  Component Value Date   WBC 3.2* 01/03/2015   HGB 11.8* 01/03/2015   HCT  34.1* 01/03/2015   MCV 96 01/03/2015   PLT 206 01/03/2015     Chemistry      Component Value Date/Time   NA 137 01/03/2015 1154   NA 137 10/25/2014 1103   K 4.8* 01/03/2015 1154   K 5.2 10/25/2014 1103   CL 105 01/03/2015 1154   CL 105 10/25/2014 1103   CO2 26 01/03/2015 1154   CO2 21 10/25/2014 1103   BUN 15 01/03/2015 1154   BUN 18 10/25/2014 1103   CREATININE 1.1 01/03/2015 1154   CREATININE 1.15 10/25/2014 1103      Component Value Date/Time   CALCIUM 8.8 01/03/2015 1154   CALCIUM 8.5 10/25/2014 1103   ALKPHOS 47 01/03/2015 1154   ALKPHOS 68 10/25/2014 1103   AST 27 01/03/2015 1154   AST 23 10/25/2014 1103   ALT 38 01/03/2015 1154   ALT 53 10/25/2014 1103   BILITOT 0.80 01/03/2015 1154   BILITOT 0.6 10/25/2014 1103         Impression and Plan: [name] is 49 year old gentleman with IgG Kappa myeloma. He has extensive marrow involvement. His beta-2 microglobulin was almost 19. This by the highest that seen for a patient with myeloma.  Again, I know that he is responding. His last beta-2 microglobulin was down at 2.93. His last M spike was down to 1.2  We will get his myeloma levels that were drawn today back next week and we will see what they look like.  I spent about 40 minutes with he and his wife. He had quite a few questions. I answered his questions.  I  talked to them again about transplant. I went over some of the issues with transplant. I again reinforced the fact that I thought the transplant would be beneficial.  I will plan to see him back myself in one month.  It is hard to say how much longer we'll have to treat him. I think that as long as his myeloma levels are responding, we have to continue with therapy.   [name], MD 9/16/20161:14 PM

## 2015-01-06 ENCOUNTER — Telehealth: Payer: Self-pay | Admitting: Hematology & Oncology

## 2015-01-06 NOTE — Telephone Encounter (Signed)
Called patient's home phn. L/m stating patient's upcoming appts in October 2016. Also sent appointment calendar by mail.       [facility].

## 2015-01-08 ENCOUNTER — Other Ambulatory Visit: Payer: Self-pay | Admitting: *Deleted

## 2015-01-08 ENCOUNTER — Telehealth: Payer: Self-pay | Admitting: *Deleted

## 2015-01-08 LAB — PROTEIN ELECTROPHORESIS, SERUM, WITH REFLEX
Abnormal Protein Band1: 1 g/dL
Albumin ELP: 4.1 g/dL (ref 3.8–4.8)
Alpha-1-Globulin: 0.3 g/dL (ref 0.2–0.3)
Alpha-2-Globulin: 0.6 g/dL (ref 0.5–0.9)
Beta 2: 0.2 g/dL (ref 0.2–0.5)
Beta Globulin: 0.4 g/dL (ref 0.4–0.6)
Gamma Globulin: 1.4 g/dL (ref 0.8–1.7)
Total Protein, Serum Electrophoresis: 6.9 g/dL (ref 6.1–8.1)

## 2015-01-08 LAB — LACTATE DEHYDROGENASE: LDH: 131 U/L (ref 94–250)

## 2015-01-08 LAB — KAPPA/LAMBDA LIGHT CHAINS
Kappa free light chain: 2.65 mg/dL — ABNORMAL HIGH (ref 0.33–1.94)
Kappa:Lambda Ratio: 2.85 — ABNORMAL HIGH (ref 0.26–1.65)
Lambda Free Lght Chn: 0.93 mg/dL (ref 0.57–2.63)

## 2015-01-08 LAB — IFE INTERPRETATION

## 2015-01-08 LAB — IGG, IGA, IGM
IgA: 62 mg/dL — ABNORMAL LOW (ref 68–379)
IgG (Immunoglobin G), Serum: 1660 mg/dL — ABNORMAL HIGH (ref 650–1600)
IgM, Serum: 44 mg/dL (ref 41–251)

## 2015-01-08 LAB — BETA 2 MICROGLOBULIN, SERUM: Beta-2 Microglobulin: 2.97 mg/L — ABNORMAL HIGH (ref <=2.51)

## 2015-01-08 NOTE — Telephone Encounter (Addendum)
Patient aware of results  ----- Message from [name], MD sent at 01/08/2015  6:19 AM EDT ----- Call - myeloma protein now down to 1.0g/dL.  Slow but steady improvement.  pete

## 2015-01-10 ENCOUNTER — Ambulatory Visit (HOSPITAL_BASED_OUTPATIENT_CLINIC_OR_DEPARTMENT_OTHER): Payer: BLUE CROSS/BLUE SHIELD

## 2015-01-10 VITALS — BP 139/76 | HR 56 | Temp 98.4°F | Resp 18

## 2015-01-10 DIAGNOSIS — Z5112 Encounter for antineoplastic immunotherapy: Secondary | ICD-10-CM | POA: Diagnosis not present

## 2015-01-10 DIAGNOSIS — C9000 Multiple myeloma not having achieved remission: Secondary | ICD-10-CM | POA: Diagnosis not present

## 2015-01-10 MED ORDER — BORTEZOMIB CHEMO SQ INJECTION 3.5 MG (2.5MG/ML)
1.3000 mg/m2 | Freq: Once | INTRAMUSCULAR | Status: AC
  Administered 2015-01-10: 2.75 mg via SUBCUTANEOUS
  Filled 2015-01-10: qty 2.75

## 2015-01-10 NOTE — Patient Instructions (Signed)
Bortezomib injection What is this medicine? BORTEZOMIB (bor TEZ oh mib) is a chemotherapy drug. It slows the growth of cancer cells. This medicine is used to treat multiple myeloma, and certain lymphomas, such as mantle-cell lymphoma. This medicine may be used for other purposes; ask your health care provider or pharmacist if you have questions. COMMON BRAND NAME(S): Velcade What should I tell my health care provider before I take this medicine? They need to know if you have any of these conditions: -diabetes -heart disease -irregular heartbeat -liver disease -on hemodialysis -low blood counts, like low white blood cells, platelets, or hemoglobin -peripheral neuropathy -taking medicine for blood pressure -an unusual or allergic reaction to bortezomib, mannitol, boron, other medicines, foods, dyes, or preservatives -pregnant or trying to get pregnant -breast-feeding How should I use this medicine? This medicine is for injection into a vein or for injection under the skin. It is given by a health care professional in a hospital or clinic setting. Talk to your pediatrician regarding the use of this medicine in children. Special care may be needed. Overdosage: If you think you have taken too much of this medicine contact a poison control center or emergency room at once. NOTE: This medicine is only for you. Do not share this medicine with others. What if I miss a dose? It is important not to miss your dose. Call your doctor or health care professional if you are unable to keep an appointment. What may interact with this medicine? This medicine may interact with the following medications: -ketoconazole -rifampin -ritonavir -St. John's Wort This list may not describe all possible interactions. Give your health care provider a list of all the medicines, herbs, non-prescription drugs, or dietary supplements you use. Also tell them if you smoke, drink alcohol, or use illegal drugs. Some items  may interact with your medicine. What should I watch for while using this medicine? Visit your doctor for checks on your progress. This drug may make you feel generally unwell. This is not uncommon, as chemotherapy can affect healthy cells as well as cancer cells. Report any side effects. Continue your course of treatment even though you feel ill unless your doctor tells you to stop. You may get drowsy or dizzy. Do not drive, use machinery, or do anything that needs mental alertness until you know how this medicine affects you. Do not stand or sit up quickly, especially if you are an older patient. This reduces the risk of dizzy or fainting spells. In some cases, you may be given additional medicines to help with side effects. Follow all directions for their use. Call your doctor or health care professional for advice if you get a fever, chills or sore throat, or other symptoms of a cold or flu. Do not treat yourself. This drug decreases your body's ability to fight infections. Try to avoid being around people who are sick. This medicine may increase your risk to bruise or bleed. Call your doctor or health care professional if you notice any unusual bleeding. You may need blood work done while you are taking this medicine. In some patients, this medicine may cause a serious brain infection that may cause death. If you have any problems seeing, thinking, speaking, walking, or standing, tell your doctor right away. If you cannot reach your doctor, urgently seek other source of medical care. Do not become pregnant while taking this medicine. Women should inform their doctor if they wish to become pregnant or think they might be pregnant. There is   a potential for serious side effects to an unborn child. Talk to your health care professional or pharmacist for more information. Do not breast-feed an infant while taking this medicine. Check with your doctor or health care professional if you get an attack of  severe diarrhea, nausea and vomiting, or if you sweat a lot. The loss of too much body fluid can make it dangerous for you to take this medicine. What side effects may I notice from receiving this medicine? Side effects that you should report to your doctor or health care professional as soon as possible: -allergic reactions like skin rash, itching or hives, swelling of the face, lips, or tongue -breathing problems -changes in hearing -changes in vision -fast, irregular heartbeat -feeling faint or lightheaded, falls -pain, tingling, numbness in the hands or feet -right upper belly pain -seizures -swelling of the ankles, feet, hands -unusual bleeding or bruising -unusually weak or tired -vomiting -yellowing of the eyes or skin Side effects that usually do not require medical attention (report to your doctor or health care professional if they continue or are bothersome): -changes in emotions or moods -constipation -diarrhea -loss of appetite -headache -irritation at site where injected -nausea This list may not describe all possible side effects. Call your doctor for medical advice about side effects. You may report side effects to FDA at 1-800-FDA-1088. Where should I keep my medicine? This drug is given in a hospital or clinic and will not be stored at home. NOTE: This sheet is a summary. It may not cover all possible information. If you have questions about this medicine, talk to your doctor, pharmacist, or health care provider.  2015, [employer]. (2013-01-29 12:46:32)

## 2015-01-14 ENCOUNTER — Telehealth: Payer: Self-pay | Admitting: Hematology & Oncology

## 2015-01-14 NOTE — Telephone Encounter (Signed)
Faxed medical records to :  [facility] [place]: [phone] F: [phone]   Case: [id number]  Req: 11/18/2014 to present     COPY SCANNED

## 2015-01-17 ENCOUNTER — Ambulatory Visit (HOSPITAL_BASED_OUTPATIENT_CLINIC_OR_DEPARTMENT_OTHER): Payer: BLUE CROSS/BLUE SHIELD

## 2015-01-17 VITALS — BP 134/67 | HR 58 | Temp 98.0°F | Resp 16

## 2015-01-17 DIAGNOSIS — Z5112 Encounter for antineoplastic immunotherapy: Secondary | ICD-10-CM

## 2015-01-17 DIAGNOSIS — C9000 Multiple myeloma not having achieved remission: Secondary | ICD-10-CM | POA: Diagnosis not present

## 2015-01-17 MED ORDER — ZOLEDRONIC ACID 4 MG/100ML IV SOLN
4.0000 mg | Freq: Once | INTRAVENOUS | Status: AC
  Administered 2015-01-17: 4 mg via INTRAVENOUS
  Filled 2015-01-17: qty 100

## 2015-01-17 MED ORDER — SODIUM CHLORIDE 0.9 % IJ SOLN
3.0000 mL | Freq: Once | INTRAMUSCULAR | Status: DC | PRN
  Filled 2015-01-17: qty 10

## 2015-01-17 MED ORDER — ONDANSETRON HCL 8 MG PO TABS: 8.0000 mg | ORAL_TABLET | Freq: Once | ORAL | Status: DC

## 2015-01-17 MED ORDER — HEPARIN SOD (PORK) LOCK FLUSH 100 UNIT/ML IV SOLN
250.0000 [IU] | Freq: Once | INTRAVENOUS | Status: DC | PRN
  Filled 2015-01-17: qty 5

## 2015-01-17 MED ORDER — HEPARIN SOD (PORK) LOCK FLUSH 100 UNIT/ML IV SOLN
500.0000 [IU] | Freq: Once | INTRAVENOUS | Status: DC | PRN
  Filled 2015-01-17: qty 5

## 2015-01-17 MED ORDER — SODIUM CHLORIDE 0.9 % IJ SOLN
10.0000 mL | INTRAMUSCULAR | Status: DC | PRN
  Filled 2015-01-17: qty 10

## 2015-01-17 MED ORDER — BORTEZOMIB CHEMO SQ INJECTION 3.5 MG (2.5MG/ML)
1.3000 mg/m2 | Freq: Once | INTRAMUSCULAR | Status: AC
  Administered 2015-01-17: 2.75 mg via SUBCUTANEOUS
  Filled 2015-01-17: qty 2.75

## 2015-01-17 MED ORDER — ALTEPLASE 2 MG IJ SOLR
2.0000 mg | Freq: Once | INTRAMUSCULAR | Status: DC | PRN
  Filled 2015-01-17: qty 2

## 2015-01-17 NOTE — Patient Instructions (Signed)
Bortezomib injection What is this medicine? BORTEZOMIB (bor TEZ oh mib) is a chemotherapy drug. It slows the growth of cancer cells. This medicine is used to treat multiple myeloma, and certain lymphomas, such as mantle-cell lymphoma. This medicine may be used for other purposes; ask your health care provider or pharmacist if you have questions. COMMON BRAND NAME(S): Velcade What should I tell my health care provider before I take this medicine? They need to know if you have any of these conditions: -diabetes -heart disease -irregular heartbeat -liver disease -on hemodialysis -low blood counts, like low white blood cells, platelets, or hemoglobin -peripheral neuropathy -taking medicine for blood pressure -an unusual or allergic reaction to bortezomib, mannitol, boron, other medicines, foods, dyes, or preservatives -pregnant or trying to get pregnant -breast-feeding How should I use this medicine? This medicine is for injection into a vein or for injection under the skin. It is given by a health care professional in a hospital or clinic setting. Talk to your pediatrician regarding the use of this medicine in children. Special care may be needed. Overdosage: If you think you have taken too much of this medicine contact a poison control center or emergency room at once. NOTE: This medicine is only for you. Do not share this medicine with others. What if I miss a dose? It is important not to miss your dose. Call your doctor or health care professional if you are unable to keep an appointment. What may interact with this medicine? This medicine may interact with the following medications: -ketoconazole -rifampin -ritonavir -St. John's Wort This list may not describe all possible interactions. Give your health care provider a list of all the medicines, herbs, non-prescription drugs, or dietary supplements you use. Also tell them if you smoke, drink alcohol, or use illegal drugs. Some items  may interact with your medicine. What should I watch for while using this medicine? Visit your doctor for checks on your progress. This drug may make you feel generally unwell. This is not uncommon, as chemotherapy can affect healthy cells as well as cancer cells. Report any side effects. Continue your course of treatment even though you feel ill unless your doctor tells you to stop. You may get drowsy or dizzy. Do not drive, use machinery, or do anything that needs mental alertness until you know how this medicine affects you. Do not stand or sit up quickly, especially if you are an older patient. This reduces the risk of dizzy or fainting spells. In some cases, you may be given additional medicines to help with side effects. Follow all directions for their use. Call your doctor or health care professional for advice if you get a fever, chills or sore throat, or other symptoms of a cold or flu. Do not treat yourself. This drug decreases your body's ability to fight infections. Try to avoid being around people who are sick. This medicine may increase your risk to bruise or bleed. Call your doctor or health care professional if you notice any unusual bleeding. You may need blood work done while you are taking this medicine. In some patients, this medicine may cause a serious brain infection that may cause death. If you have any problems seeing, thinking, speaking, walking, or standing, tell your doctor right away. If you cannot reach your doctor, urgently seek other source of medical care. Do not become pregnant while taking this medicine. Women should inform their doctor if they wish to become pregnant or think they might be pregnant. There is  a potential for serious side effects to an unborn child. Talk to your health care professional or pharmacist for more information. Do not breast-feed an infant while taking this medicine. Check with your doctor or health care professional if you get an attack of  severe diarrhea, nausea and vomiting, or if you sweat a lot. The loss of too much body fluid can make it dangerous for you to take this medicine. What side effects may I notice from receiving this medicine? Side effects that you should report to your doctor or health care professional as soon as possible: -allergic reactions like skin rash, itching or hives, swelling of the face, lips, or tongue -breathing problems -changes in hearing -changes in vision -fast, irregular heartbeat -feeling faint or lightheaded, falls -pain, tingling, numbness in the hands or feet -right upper belly pain -seizures -swelling of the ankles, feet, hands -unusual bleeding or bruising -unusually weak or tired -vomiting -yellowing of the eyes or skin Side effects that usually do not require medical attention (report to your doctor or health care professional if they continue or are bothersome): -changes in emotions or moods -constipation -diarrhea -loss of appetite -headache -irritation at site where injected -nausea This list may not describe all possible side effects. Call your doctor for medical advice about side effects. You may report side effects to FDA at 1-800-FDA-1088. Where should I keep my medicine? This drug is given in a hospital or clinic and will not be stored at home. NOTE: This sheet is a summary. It may not cover all possible information. If you have questions about this medicine, talk to your doctor, pharmacist, or health care provider.  2015, [employer] Standard. (2013-01-29 12:46:32) Zoledronic Acid injection (Hypercalcemia, Oncology) What is this medicine? ZOLEDRONIC ACID (ZOE le dron ik AS id) lowers the amount of calcium loss from bone. It is used to treat too much calcium in your blood from cancer. It is also used to prevent complications of cancer that has spread to the bone. This medicine may be used for other purposes; ask your health care provider or pharmacist if you have  questions. COMMON BRAND NAME(S): Zometa What should I tell my health care provider before I take this medicine? They need to know if you have any of these conditions: -aspirin-sensitive asthma -cancer, especially if you are receiving medicines used to treat cancer -dental disease or wear dentures -infection -kidney disease -receiving corticosteroids like dexamethasone or prednisone -an unusual or allergic reaction to zoledronic acid, other medicines, foods, dyes, or preservatives -pregnant or trying to get pregnant -breast-feeding How should I use this medicine? This medicine is for infusion into a vein. It is given by a health care professional in a hospital or clinic setting. Talk to your pediatrician regarding the use of this medicine in children. Special care may be needed. Overdosage: If you think you have taken too much of this medicine contact a poison control center or emergency room at once. NOTE: This medicine is only for you. Do not share this medicine with others. What if I miss a dose? It is important not to miss your dose. Call your doctor or health care professional if you are unable to keep an appointment. What may interact with this medicine? -certain antibiotics given by injection -NSAIDs, medicines for pain and inflammation, like ibuprofen or naproxen -some diuretics like bumetanide, furosemide -teriparatide -thalidomide This list may not describe all possible interactions. Give your health care provider a list of all the medicines, herbs, non-prescription drugs, or dietary   supplements you use. Also tell them if you smoke, drink alcohol, or use illegal drugs. Some items may interact with your medicine. What should I watch for while using this medicine? Visit your doctor or health care professional for regular checkups. It may be some time before you see the benefit from this medicine. Do not stop taking your medicine unless your doctor tells you to. Your doctor may  order blood tests or other tests to see how you are doing. Women should inform their doctor if they wish to become pregnant or think they might be pregnant. There is a potential for serious side effects to an unborn child. Talk to your health care professional or pharmacist for more information. You should make sure that you get enough calcium and vitamin D while you are taking this medicine. Discuss the foods you eat and the vitamins you take with your health care professional. Some people who take this medicine have severe bone, joint, and/or muscle pain. This medicine may also increase your risk for jaw problems or a broken thigh bone. Tell your doctor right away if you have severe pain in your jaw, bones, joints, or muscles. Tell your doctor if you have any pain that does not go away or that gets worse. Tell your dentist and dental surgeon that you are taking this medicine. You should not have major dental surgery while on this medicine. See your dentist to have a dental exam and fix any dental problems before starting this medicine. Take good care of your teeth while on this medicine. Make sure you see your dentist for regular follow-up appointments. What side effects may I notice from receiving this medicine? Side effects that you should report to your doctor or health care professional as soon as possible: -allergic reactions like skin rash, itching or hives, swelling of the face, lips, or tongue -anxiety, confusion, or depression -breathing problems -changes in vision -eye pain -feeling faint or lightheaded, falls -jaw pain, especially after dental work -mouth sores -muscle cramps, stiffness, or weakness -trouble passing urine or change in the amount of urine Side effects that usually do not require medical attention (report to your doctor or health care professional if they continue or are bothersome): -bone, joint, or muscle pain -constipation -diarrhea -fever -hair loss -irritation  at site where injected -loss of appetite -nausea, vomiting -stomach upset -trouble sleeping -trouble swallowing -weak or tired This list may not describe all possible side effects. Call your doctor for medical advice about side effects. You may report side effects to FDA at 1-800-FDA-1088. Where should I keep my medicine? This drug is given in a hospital or clinic and will not be stored at home. NOTE: This sheet is a summary. It may not cover all possible information. If you have questions about this medicine, talk to your doctor, pharmacist, or health care provider.  2015, [employer]vier/Gold Standard. (2012-09-14 13:03:13)

## 2015-01-20 ENCOUNTER — Other Ambulatory Visit: Payer: Self-pay | Admitting: *Deleted

## 2015-01-20 DIAGNOSIS — C9000 Multiple myeloma not having achieved remission: Secondary | ICD-10-CM

## 2015-01-20 MED ORDER — LENALIDOMIDE 25 MG PO CAPS: 25.0000 mg | ORAL_CAPSULE | Freq: Every day | ORAL | Status: DC

## 2015-01-31 ENCOUNTER — Ambulatory Visit (HOSPITAL_BASED_OUTPATIENT_CLINIC_OR_DEPARTMENT_OTHER): Payer: BLUE CROSS/BLUE SHIELD | Admitting: Hematology & Oncology

## 2015-01-31 ENCOUNTER — Ambulatory Visit (HOSPITAL_BASED_OUTPATIENT_CLINIC_OR_DEPARTMENT_OTHER): Payer: BLUE CROSS/BLUE SHIELD

## 2015-01-31 ENCOUNTER — Encounter: Payer: Self-pay | Admitting: Hematology & Oncology

## 2015-01-31 VITALS — BP 139/72 | HR 68 | Temp 97.3°F | Resp 16 | Ht 72.0 in | Wt 216.0 lb

## 2015-01-31 DIAGNOSIS — C9000 Multiple myeloma not having achieved remission: Secondary | ICD-10-CM

## 2015-01-31 DIAGNOSIS — Z5112 Encounter for antineoplastic immunotherapy: Secondary | ICD-10-CM

## 2015-01-31 LAB — CBC WITH DIFFERENTIAL (CANCER CENTER ONLY)
BASO#: 0 10*3/uL (ref 0.0–0.2)
BASO%: 0.2 % (ref 0.0–2.0)
EOS%: 0 % (ref 0.0–7.0)
Eosinophils Absolute: 0 10*3/uL (ref 0.0–0.5)
HCT: 34.7 % — ABNORMAL LOW (ref 38.7–49.9)
HGB: 12.2 g/dL — ABNORMAL LOW (ref 13.0–17.1)
LYMPH#: 0.4 10*3/uL — ABNORMAL LOW (ref 0.9–3.3)
LYMPH%: 8.2 % — ABNORMAL LOW (ref 14.0–48.0)
MCH: 34 pg — ABNORMAL HIGH (ref 28.0–33.4)
MCHC: 35.2 g/dL (ref 32.0–35.9)
MCV: 97 fL (ref 82–98)
MONO#: 0 10*3/uL — ABNORMAL LOW (ref 0.1–0.9)
MONO%: 0.8 % (ref 0.0–13.0)
NEUT#: 4.7 10*3/uL (ref 1.5–6.5)
NEUT%: 90.8 % — ABNORMAL HIGH (ref 40.0–80.0)
Platelets: 238 10*3/uL (ref 145–400)
RBC: 3.59 10*6/uL — ABNORMAL LOW (ref 4.20–5.70)
RDW: 14.2 % (ref 11.1–15.7)
WBC: 5.1 10*3/uL (ref 4.0–10.0)

## 2015-01-31 LAB — CMP (CANCER CENTER ONLY)
ALT(SGPT): 51 U/L — ABNORMAL HIGH (ref 10–47)
AST: 29 U/L (ref 11–38)
Albumin: 3.9 g/dL (ref 3.3–5.5)
Alkaline Phosphatase: 44 U/L (ref 26–84)
BUN, Bld: 18 mg/dL (ref 7–22)
CO2: 24 mEq/L (ref 18–33)
Calcium: 8.7 mg/dL (ref 8.0–10.3)
Chloride: 106 mEq/L (ref 98–108)
Creat: 1.6 mg/dl — ABNORMAL HIGH (ref 0.6–1.2)
Glucose, Bld: 138 mg/dL — ABNORMAL HIGH (ref 73–118)
Potassium: 4.7 mEq/L (ref 3.3–4.7)
Sodium: 134 mEq/L (ref 128–145)
Total Bilirubin: 0.7 mg/dl (ref 0.20–1.60)
Total Protein: 7.2 g/dL (ref 6.4–8.1)

## 2015-01-31 MED ORDER — BORTEZOMIB CHEMO SQ INJECTION 3.5 MG (2.5MG/ML)
1.3000 mg/m2 | Freq: Once | INTRAMUSCULAR | Status: AC
  Administered 2015-01-31: 2.75 mg via SUBCUTANEOUS
  Filled 2015-01-31: qty 2.75

## 2015-01-31 NOTE — Progress Notes (Signed)
Hematology and Oncology Follow Up Visit  Tyler Phillips [id number] 05-Jan-1966 49 y.o. 01/31/2015   Principle Diagnosis:   IgG Kappa myeloma  Current Therapy:    S/p Cycle #5 of RVD  Zometa 4 mg IV every month     Interim History:  Tyler Phillips is back for follow-up. He  continues to do quite well. He has tolerated treatment quite nicely so far.  His last myeloma studies showed an M spike of 1.0 g/dL. His IgG level was 1660mg /dL. His Kappa Light chain was 2.65 mg/dL.  His wife is doing better. She had right knee surgery. She now has a much more manageable knee brace.  He is exercising. He is staying incredibly active. He is doing a lot of work around the house.  His appetite has been good. He's had no nausea vomiting.  He does state that on occasion, he can get some numbness in his hands and feet. He has nothing that is consistent.  He's had no cough. He's had a problem with bowels or bladder. On occasion, he does get a little constipated but lactulose seems to help..    Overall, his performance status is ECOG 0.  Medications:  Current outpatient prescriptions:  .  aspirin 325 MG EC tablet, Take 325 mg by mouth daily., Disp: , Rfl:  .  calcium carbonate (TUMS - DOSED IN MG ELEMENTAL CALCIUM) 500 MG chewable tablet, Chew 1 tablet by mouth 2 (two) times daily., Disp: , Rfl:  .  dexamethasone (DECADRON) 4 MG tablet, TAKE 5 TABLET AT ONE TIME WITH FOOD FOR 3 WEEKS THEN OFF ONE WEEK *INS ALLOWS 30 DAYS, Disp: 60 tablet, Rfl: 1 .  famciclovir (FAMVIR) 500 MG tablet, Take 1 tablet (500 mg total) by mouth daily., Disp: 30 tablet, Rfl: 6 .  Lactulose 20 GM/30ML SOLN, Take 30 mLs (20 g total) by mouth 4 (four) times daily. (Patient taking differently: Take 30 mLs by mouth as needed. ), Disp: 1000 mL, Rfl: 5 .  lenalidomide (REVLIMID) 25 MG capsule, Take 1 capsule (25 mg total) by mouth daily. Take for 21 days and 7 days off. INOM#[id number], Disp: 21 capsule, Rfl: 0 .  LORazepam (ATIVAN) 0.5 MG  tablet, Take 1 tablet (0.5 mg total) by mouth every 6 (six) hours as needed (Nausea or vomiting)., Disp: 60 tablet, Rfl: 0 .  prochlorperazine (COMPAZINE) 10 MG tablet, Take 1 tablet (10 mg total) by mouth every 6 (six) hours as needed (Nausea or vomiting)., Disp: 30 tablet, Rfl: 6 .  zolpidem (AMBIEN) 10 MG tablet, Take 10 mg by mouth at bedtime as needed. , Disp: , Rfl: 0  Allergies: No Known Allergies  Past Medical History, Surgical history, Social history, and Family History were reviewed and updated.  Review of Systems: As above  Physical Exam:  height is 6' (1.829 m) and weight is 216 lb (97.977 kg). His oral temperature is 97.3 F (36.3 C). His blood pressure is 139/72 and his pulse is 68. His respiration is 16.   Wt Readings from Last 3 Encounters:  01/31/15 216 lb (97.977 kg)  01/03/15 217 lb (98.431 kg)  12/06/14 211 lb (95.709 kg)     Well-developed and well-nourished white chum in no obvious distress. Head and neck exam shows no ocular or oral lesions. There are no palpable cervical or supraclavicular lymph nodes. Lungs are clear. Cardiac exam regular rate and rhythm with no murmurs, rubs or bruits. Abdomen is soft. He has good bowel sounds. There is no fluid wave.  There is no palpable hepatomegaly. His spleen tip might be palpable at the left costal margin. Extremities shows no clubbing, cyanosis or edema. He may have some trace edema in his lower legs. Skin exam shows no rashes, ecchymoses or petechia. Neurological exam is nonfocal.  Lab Results  Component Value Date   WBC 5.1 01/31/2015   HGB 12.2* 01/31/2015   HCT 34.7* 01/31/2015   MCV 97 01/31/2015   PLT 238 01/31/2015     Chemistry      Component Value Date/Time   NA 134 01/31/2015 1332   NA 137 10/25/2014 1103   K 4.7 01/31/2015 1332   K 5.2 10/25/2014 1103   CL 106 01/31/2015 1332   CL 105 10/25/2014 1103   CO2 24 01/31/2015 1332   CO2 21 10/25/2014 1103   BUN 18 01/31/2015 1332   BUN 18 10/25/2014  1103   CREATININE 1.6* 01/31/2015 1332   CREATININE 1.15 10/25/2014 1103      Component Value Date/Time   CALCIUM 8.7 01/31/2015 1332   CALCIUM 8.5 10/25/2014 1103   ALKPHOS 44 01/31/2015 1332   ALKPHOS 68 10/25/2014 1103   AST 29 01/31/2015 1332   AST 23 10/25/2014 1103   ALT 51* 01/31/2015 1332   ALT 53 10/25/2014 1103   BILITOT 0.70 01/31/2015 1332   BILITOT 0.6 10/25/2014 1103         Impression and Plan: Tyler Phillips is 49 year old gentleman with IgG Kappa myeloma. He has extensive marrow involvement. His beta-2 microglobulin was almost 19. This by the highest that I have seen for a patient with myeloma.  Again, I know that he is responding. His last beta-2 microglobulin was down at 2.93. His last M spike was down to 1.0  We will get his myeloma levels that were drawn today back next week and we will see what they look like.  I spent about 40 minutes with he and his wife. He had quite a few questions. I answered his questions.  I  talked to them again about transplant. I went over some of the issues with transplant. I again reinforced the fact that I thought the transplant would be beneficial.  I will plan to see him back myself in one month.  It is hard to say how much longer we'll have to treat him. I think that as long as his myeloma levels are responding, we have to continue with therapy.   [name], MD 10/14/20164:38 PM

## 2015-01-31 NOTE — Patient Instructions (Signed)
Bortezomib injection What is this medicine? BORTEZOMIB (bor TEZ oh mib) is a medicine that targets proteins in cancer cells and stops the cancer cells from growing. It is used to treat multiple myeloma and mantle-cell lymphoma. This medicine may be used for other purposes; ask your health care provider or pharmacist if you have questions. What should I tell my health care provider before I take this medicine? They need to know if you have any of these conditions: -diabetes -heart disease -irregular heartbeat -liver disease -on hemodialysis -low blood counts, like low white blood cells, platelets, or hemoglobin -peripheral neuropathy -taking medicine for blood pressure -an unusual or allergic reaction to bortezomib, mannitol, boron, other medicines, foods, dyes, or preservatives -pregnant or trying to get pregnant -breast-feeding How should I use this medicine? This medicine is for injection into a vein or for injection under the skin. It is given by a health care professional in a hospital or clinic setting. Talk to your pediatrician regarding the use of this medicine in children. Special care may be needed. Overdosage: If you think you have taken too much of this medicine contact a poison control center or emergency room at once. NOTE: This medicine is only for you. Do not share this medicine with others. What if I miss a dose? It is important not to miss your dose. Call your doctor or health care professional if you are unable to keep an appointment. What may interact with this medicine? This medicine may interact with the following medications: -ketoconazole -rifampin -ritonavir -St. John's Wort This list may not describe all possible interactions. Give your health care provider a list of all the medicines, herbs, non-prescription drugs, or dietary supplements you use. Also tell them if you smoke, drink alcohol, or use illegal drugs. Some items may interact with your medicine. What  should I watch for while using this medicine? Visit your doctor for checks on your progress. This drug may make you feel generally unwell. This is not uncommon, as chemotherapy can affect healthy cells as well as cancer cells. Report any side effects. Continue your course of treatment even though you feel ill unless your doctor tells you to stop. You may get drowsy or dizzy. Do not drive, use machinery, or do anything that needs mental alertness until you know how this medicine affects you. Do not stand or sit up quickly, especially if you are an older patient. This reduces the risk of dizzy or fainting spells. In some cases, you may be given additional medicines to help with side effects. Follow all directions for their use. Call your doctor or health care professional for advice if you get a fever, chills or sore throat, or other symptoms of a cold or flu. Do not treat yourself. This drug decreases your body's ability to fight infections. Try to avoid being around people who are sick. This medicine may increase your risk to bruise or bleed. Call your doctor or health care professional if you notice any unusual bleeding. You may need blood work done while you are taking this medicine. In some patients, this medicine may cause a serious brain infection that may cause death. If you have any problems seeing, thinking, speaking, walking, or standing, tell your doctor right away. If you cannot reach your doctor, urgently seek other source of medical care. Do not become pregnant while taking this medicine. Women should inform their doctor if they wish to become pregnant or think they might be pregnant. There is a potential for serious  side effects to an unborn child. Talk to your health care professional or pharmacist for more information. Do not breast-feed an infant while taking this medicine. Check with your doctor or health care professional if you get an attack of severe diarrhea, nausea and vomiting, or if  you sweat a lot. The loss of too much body fluid can make it dangerous for you to take this medicine. What side effects may I notice from receiving this medicine? Side effects that you should report to your doctor or health care professional as soon as possible: -allergic reactions like skin rash, itching or hives, swelling of the face, lips, or tongue -breathing problems -changes in hearing -changes in vision -fast, irregular heartbeat -feeling faint or lightheaded, falls -pain, tingling, numbness in the hands or feet -right upper belly pain -seizures -swelling of the ankles, feet, hands -unusual bleeding or bruising -unusually weak or tired -vomiting -yellowing of the eyes or skin Side effects that usually do not require medical attention (report to your doctor or health care professional if they continue or are bothersome): -changes in emotions or moods -constipation -diarrhea -loss of appetite -headache -irritation at site where injected -nausea This list may not describe all possible side effects. Call your doctor for medical advice about side effects. You may report side effects to FDA at 1-800-FDA-1088. Where should I keep my medicine? This drug is given in a hospital or clinic and will not be stored at home. NOTE: This sheet is a summary. It may not cover all possible information. If you have questions about this medicine, talk to your doctor, pharmacist, or health care provider.    2016, [employer] Standard. (2014-06-04 14:47:04)

## 2015-02-04 LAB — PROTEIN ELECTROPHORESIS, SERUM, WITH REFLEX
Abnormal Protein Band1: 0.9 g/dL
Albumin ELP: 4.1 g/dL (ref 3.8–4.8)
Alpha-1-Globulin: 0.3 g/dL (ref 0.2–0.3)
Alpha-2-Globulin: 0.6 g/dL (ref 0.5–0.9)
Beta 2: 0.3 g/dL (ref 0.2–0.5)
Beta Globulin: 0.4 g/dL (ref 0.4–0.6)
Gamma Globulin: 1.3 g/dL (ref 0.8–1.7)
Total Protein, Serum Electrophoresis: 6.9 g/dL (ref 6.1–8.1)

## 2015-02-04 LAB — KAPPA/LAMBDA LIGHT CHAINS
Kappa free light chain: 2.04 mg/dL — ABNORMAL HIGH (ref 0.33–1.94)
Kappa:Lambda Ratio: 2.43 — ABNORMAL HIGH (ref 0.26–1.65)
Lambda Free Lght Chn: 0.84 mg/dL (ref 0.57–2.63)

## 2015-02-04 LAB — IGG, IGA, IGM
IgA: 83 mg/dL (ref 68–379)
IgG (Immunoglobin G), Serum: 1480 mg/dL (ref 650–1600)
IgM, Serum: 33 mg/dL — ABNORMAL LOW (ref 41–251)

## 2015-02-04 LAB — BETA 2 MICROGLOBULIN, SERUM: Beta-2 Microglobulin: 2.32 mg/L (ref <=2.51)

## 2015-02-04 LAB — IFE INTERPRETATION

## 2015-02-04 LAB — LACTATE DEHYDROGENASE: LDH: 141 U/L (ref 94–250)

## 2015-02-05 ENCOUNTER — Telehealth: Payer: Self-pay | Admitting: *Deleted

## 2015-02-05 NOTE — Telephone Encounter (Addendum)
Patient is aware of results  ----- Message from [name], MD sent at 02/05/2015  7:37 AM EDT ----- Call - myeloma protein is now down to 0.9!!  Slow but steady wins the race!!  Tyler Phillips

## 2015-02-07 ENCOUNTER — Ambulatory Visit (HOSPITAL_BASED_OUTPATIENT_CLINIC_OR_DEPARTMENT_OTHER): Payer: BLUE CROSS/BLUE SHIELD

## 2015-02-07 VITALS — BP 147/73 | HR 67 | Temp 98.4°F | Resp 16

## 2015-02-07 DIAGNOSIS — Z5112 Encounter for antineoplastic immunotherapy: Secondary | ICD-10-CM | POA: Diagnosis not present

## 2015-02-07 DIAGNOSIS — C9000 Multiple myeloma not having achieved remission: Secondary | ICD-10-CM

## 2015-02-07 MED ORDER — BORTEZOMIB CHEMO SQ INJECTION 3.5 MG (2.5MG/ML)
1.3000 mg/m2 | Freq: Once | INTRAMUSCULAR | Status: AC
  Administered 2015-02-07: 2.75 mg via SUBCUTANEOUS
  Filled 2015-02-07: qty 2.75

## 2015-02-07 MED ORDER — ONDANSETRON HCL 8 MG PO TABS: 8.0000 mg | ORAL_TABLET | Freq: Once | ORAL | Status: DC

## 2015-02-07 NOTE — Patient Instructions (Signed)
Bortezomib injection What is this medicine? BORTEZOMIB (bor TEZ oh mib) is a medicine that targets proteins in cancer cells and stops the cancer cells from growing. It is used to treat multiple myeloma and mantle-cell lymphoma. This medicine may be used for other purposes; ask your health care provider or pharmacist if you have questions. What should I tell my health care provider before I take this medicine? They need to know if you have any of these conditions: -diabetes -heart disease -irregular heartbeat -liver disease -on hemodialysis -low blood counts, like low white blood cells, platelets, or hemoglobin -peripheral neuropathy -taking medicine for blood pressure -an unusual or allergic reaction to bortezomib, mannitol, boron, other medicines, foods, dyes, or preservatives -pregnant or trying to get pregnant -breast-feeding How should I use this medicine? This medicine is for injection into a vein or for injection under the skin. It is given by a health care professional in a hospital or clinic setting. Talk to your pediatrician regarding the use of this medicine in children. Special care may be needed. Overdosage: If you think you have taken too much of this medicine contact a poison control center or emergency room at once. NOTE: This medicine is only for you. Do not share this medicine with others. What if I miss a dose? It is important not to miss your dose. Call your doctor or health care professional if you are unable to keep an appointment. What may interact with this medicine? This medicine may interact with the following medications: -ketoconazole -rifampin -ritonavir -St. John's Wort This list may not describe all possible interactions. Give your health care provider a list of all the medicines, herbs, non-prescription drugs, or dietary supplements you use. Also tell them if you smoke, drink alcohol, or use illegal drugs. Some items may interact with your medicine. What  should I watch for while using this medicine? Visit your doctor for checks on your progress. This drug may make you feel generally unwell. This is not uncommon, as chemotherapy can affect healthy cells as well as cancer cells. Report any side effects. Continue your course of treatment even though you feel ill unless your doctor tells you to stop. You may get drowsy or dizzy. Do not drive, use machinery, or do anything that needs mental alertness until you know how this medicine affects you. Do not stand or sit up quickly, especially if you are an older patient. This reduces the risk of dizzy or fainting spells. In some cases, you may be given additional medicines to help with side effects. Follow all directions for their use. Call your doctor or health care professional for advice if you get a fever, chills or sore throat, or other symptoms of a cold or flu. Do not treat yourself. This drug decreases your body's ability to fight infections. Try to avoid being around people who are sick. This medicine may increase your risk to bruise or bleed. Call your doctor or health care professional if you notice any unusual bleeding. You may need blood work done while you are taking this medicine. In some patients, this medicine may cause a serious brain infection that may cause death. If you have any problems seeing, thinking, speaking, walking, or standing, tell your doctor right away. If you cannot reach your doctor, urgently seek other source of medical care. Do not become pregnant while taking this medicine. Women should inform their doctor if they wish to become pregnant or think they might be pregnant. There is a potential for serious  side effects to an unborn child. Talk to your health care professional or pharmacist for more information. Do not breast-feed an infant while taking this medicine. Check with your doctor or health care professional if you get an attack of severe diarrhea, nausea and vomiting, or if  you sweat a lot. The loss of too much body fluid can make it dangerous for you to take this medicine. What side effects may I notice from receiving this medicine? Side effects that you should report to your doctor or health care professional as soon as possible: -allergic reactions like skin rash, itching or hives, swelling of the face, lips, or tongue -breathing problems -changes in hearing -changes in vision -fast, irregular heartbeat -feeling faint or lightheaded, falls -pain, tingling, numbness in the hands or feet -right upper belly pain -seizures -swelling of the ankles, feet, hands -unusual bleeding or bruising -unusually weak or tired -vomiting -yellowing of the eyes or skin Side effects that usually do not require medical attention (report to your doctor or health care professional if they continue or are bothersome): -changes in emotions or moods -constipation -diarrhea -loss of appetite -headache -irritation at site where injected -nausea This list may not describe all possible side effects. Call your doctor for medical advice about side effects. You may report side effects to FDA at 1-800-FDA-1088. Where should I keep my medicine? This drug is given in a hospital or clinic and will not be stored at home. NOTE: This sheet is a summary. It may not cover all possible information. If you have questions about this medicine, talk to your doctor, pharmacist, or health care provider.    2016, [employer] Standard. (2014-06-04 14:47:04)

## 2015-02-11 ENCOUNTER — Other Ambulatory Visit: Payer: Self-pay | Admitting: Nurse Practitioner

## 2015-02-11 DIAGNOSIS — C9000 Multiple myeloma not having achieved remission: Secondary | ICD-10-CM

## 2015-02-11 MED ORDER — LENALIDOMIDE 25 MG PO CAPS: 25.0000 mg | ORAL_CAPSULE | Freq: Every day | ORAL | Status: DC

## 2015-02-14 ENCOUNTER — Ambulatory Visit (HOSPITAL_BASED_OUTPATIENT_CLINIC_OR_DEPARTMENT_OTHER): Payer: BLUE CROSS/BLUE SHIELD

## 2015-02-14 VITALS — BP 144/80 | HR 82 | Temp 98.2°F | Resp 18

## 2015-02-14 DIAGNOSIS — C9002 Multiple myeloma in relapse: Secondary | ICD-10-CM

## 2015-02-14 DIAGNOSIS — C9000 Multiple myeloma not having achieved remission: Secondary | ICD-10-CM

## 2015-02-14 DIAGNOSIS — Z5112 Encounter for antineoplastic immunotherapy: Secondary | ICD-10-CM

## 2015-02-14 MED ORDER — ZOLEDRONIC ACID 4 MG/100ML IV SOLN
4.0000 mg | Freq: Once | INTRAVENOUS | Status: AC
  Administered 2015-02-14: 4 mg via INTRAVENOUS
  Filled 2015-02-14: qty 100

## 2015-02-14 MED ORDER — BORTEZOMIB CHEMO SQ INJECTION 3.5 MG (2.5MG/ML)
1.3000 mg/m2 | Freq: Once | INTRAMUSCULAR | Status: AC
  Administered 2015-02-14: 2.75 mg via SUBCUTANEOUS
  Filled 2015-02-14: qty 2.75

## 2015-02-14 NOTE — Patient Instructions (Signed)
Bortezomib injection What is this medicine? BORTEZOMIB (bor TEZ oh mib) is a medicine that targets proteins in cancer cells and stops the cancer cells from growing. It is used to treat multiple myeloma and mantle-cell lymphoma. This medicine may be used for other purposes; ask your health care provider or pharmacist if you have questions. What should I tell my health care provider before I take this medicine? They need to know if you have any of these conditions: -diabetes -heart disease -irregular heartbeat -liver disease -on hemodialysis -low blood counts, like low white blood cells, platelets, or hemoglobin -peripheral neuropathy -taking medicine for blood pressure -an unusual or allergic reaction to bortezomib, mannitol, boron, other medicines, foods, dyes, or preservatives -pregnant or trying to get pregnant -breast-feeding How should I use this medicine? This medicine is for injection into a vein or for injection under the skin. It is given by a health care professional in a hospital or clinic setting. Talk to your pediatrician regarding the use of this medicine in children. Special care may be needed. Overdosage: If you think you have taken too much of this medicine contact a poison control center or emergency room at once. NOTE: This medicine is only for you. Do not share this medicine with others. What if I miss a dose? It is important not to miss your dose. Call your doctor or health care professional if you are unable to keep an appointment. What may interact with this medicine? This medicine may interact with the following medications: -ketoconazole -rifampin -ritonavir -St. John's Wort This list may not describe all possible interactions. Give your health care provider a list of all the medicines, herbs, non-prescription drugs, or dietary supplements you use. Also tell them if you smoke, drink alcohol, or use illegal drugs. Some items may interact with your medicine. What  should I watch for while using this medicine? Visit your doctor for checks on your progress. This drug may make you feel generally unwell. This is not uncommon, as chemotherapy can affect healthy cells as well as cancer cells. Report any side effects. Continue your course of treatment even though you feel ill unless your doctor tells you to stop. You may get drowsy or dizzy. Do not drive, use machinery, or do anything that needs mental alertness until you know how this medicine affects you. Do not stand or sit up quickly, especially if you are an older patient. This reduces the risk of dizzy or fainting spells. In some cases, you may be given additional medicines to help with side effects. Follow all directions for their use. Call your doctor or health care professional for advice if you get a fever, chills or sore throat, or other symptoms of a cold or flu. Do not treat yourself. This drug decreases your body's ability to fight infections. Try to avoid being around people who are sick. This medicine may increase your risk to bruise or bleed. Call your doctor or health care professional if you notice any unusual bleeding. You may need blood work done while you are taking this medicine. In some patients, this medicine may cause a serious brain infection that may cause death. If you have any problems seeing, thinking, speaking, walking, or standing, tell your doctor right away. If you cannot reach your doctor, urgently seek other source of medical care. Do not become pregnant while taking this medicine. Women should inform their doctor if they wish to become pregnant or think they might be pregnant. There is a potential for serious  side effects to an unborn child. Talk to your health care professional or pharmacist for more information. Do not breast-feed an infant while taking this medicine. Check with your doctor or health care professional if you get an attack of severe diarrhea, nausea and vomiting, or if  you sweat a lot. The loss of too much body fluid can make it dangerous for you to take this medicine. What side effects may I notice from receiving this medicine? Side effects that you should report to your doctor or health care professional as soon as possible: -allergic reactions like skin rash, itching or hives, swelling of the face, lips, or tongue -breathing problems -changes in hearing -changes in vision -fast, irregular heartbeat -feeling faint or lightheaded, falls -pain, tingling, numbness in the hands or feet -right upper belly pain -seizures -swelling of the ankles, feet, hands -unusual bleeding or bruising -unusually weak or tired -vomiting -yellowing of the eyes or skin Side effects that usually do not require medical attention (report to your doctor or health care professional if they continue or are bothersome): -changes in emotions or moods -constipation -diarrhea -loss of appetite -headache -irritation at site where injected -nausea This list may not describe all possible side effects. Call your doctor for medical advice about side effects. You may report side effects to FDA at 1-800-FDA-1088. Where should I keep my medicine? This drug is given in a hospital or clinic and will not be stored at home. NOTE: This sheet is a summary. It may not cover all possible information. If you have questions about this medicine, talk to your doctor, pharmacist, or health care provider.    2016, [employer] Standard. (2014-06-04 14:47:04)

## 2015-02-28 ENCOUNTER — Ambulatory Visit (HOSPITAL_BASED_OUTPATIENT_CLINIC_OR_DEPARTMENT_OTHER): Payer: BLUE CROSS/BLUE SHIELD | Admitting: Hematology & Oncology

## 2015-02-28 ENCOUNTER — Ambulatory Visit (HOSPITAL_BASED_OUTPATIENT_CLINIC_OR_DEPARTMENT_OTHER): Payer: BLUE CROSS/BLUE SHIELD

## 2015-02-28 ENCOUNTER — Encounter: Payer: Self-pay | Admitting: Hematology & Oncology

## 2015-02-28 VITALS — BP 144/90 | HR 69 | Temp 97.4°F

## 2015-02-28 DIAGNOSIS — Z5112 Encounter for antineoplastic immunotherapy: Secondary | ICD-10-CM

## 2015-02-28 DIAGNOSIS — C9000 Multiple myeloma not having achieved remission: Secondary | ICD-10-CM

## 2015-02-28 LAB — CBC WITH DIFFERENTIAL (CANCER CENTER ONLY)
BASO#: 0 10*3/uL (ref 0.0–0.2)
BASO%: 0.2 % (ref 0.0–2.0)
EOS%: 0 % (ref 0.0–7.0)
Eosinophils Absolute: 0 10*3/uL (ref 0.0–0.5)
HCT: 36.9 % — ABNORMAL LOW (ref 38.7–49.9)
HGB: 12.9 g/dL — ABNORMAL LOW (ref 13.0–17.1)
LYMPH#: 0.4 10*3/uL — ABNORMAL LOW (ref 0.9–3.3)
LYMPH%: 8.1 % — ABNORMAL LOW (ref 14.0–48.0)
MCH: 33.8 pg — ABNORMAL HIGH (ref 28.0–33.4)
MCHC: 35 g/dL (ref 32.0–35.9)
MCV: 97 fL (ref 82–98)
MONO#: 0.1 10*3/uL (ref 0.1–0.9)
MONO%: 1.1 % (ref 0.0–13.0)
NEUT#: 4.1 10*3/uL (ref 1.5–6.5)
NEUT%: 90.6 % — ABNORMAL HIGH (ref 40.0–80.0)
Platelets: 242 10*3/uL (ref 145–400)
RBC: 3.82 10*6/uL — ABNORMAL LOW (ref 4.20–5.70)
RDW: 13.7 % (ref 11.1–15.7)
WBC: 4.6 10*3/uL (ref 4.0–10.0)

## 2015-02-28 LAB — CMP (CANCER CENTER ONLY)
ALT(SGPT): 43 U/L (ref 10–47)
AST: 22 U/L (ref 11–38)
Albumin: 3.7 g/dL (ref 3.3–5.5)
Alkaline Phosphatase: 51 U/L (ref 26–84)
BUN, Bld: 19 mg/dL (ref 7–22)
CO2: 26 mEq/L (ref 18–33)
Calcium: 8.8 mg/dL (ref 8.0–10.3)
Chloride: 104 mEq/L (ref 98–108)
Creat: 1.3 mg/dl — ABNORMAL HIGH (ref 0.6–1.2)
Glucose, Bld: 127 mg/dL — ABNORMAL HIGH (ref 73–118)
Potassium: 4.4 mEq/L (ref 3.3–4.7)
Sodium: 146 mEq/L — ABNORMAL HIGH (ref 128–145)
Total Bilirubin: 0.7 mg/dl (ref 0.20–1.60)
Total Protein: 8.3 g/dL — ABNORMAL HIGH (ref 6.4–8.1)

## 2015-02-28 LAB — LACTATE DEHYDROGENASE (CC13): LDH: 159 U/L (ref 125–245)

## 2015-02-28 MED ORDER — BORTEZOMIB CHEMO SQ INJECTION 3.5 MG (2.5MG/ML)
1.3000 mg/m2 | Freq: Once | INTRAMUSCULAR | Status: AC
  Administered 2015-02-28: 2.75 mg via SUBCUTANEOUS
  Filled 2015-02-28: qty 2.75

## 2015-02-28 NOTE — Progress Notes (Signed)
Hematology and Oncology Follow Up Visit  Tyler Phillips:[id number] 11-08-1965 49 y.o. 02/28/2015   Principle Diagnosis:   IgG Kappa myeloma  Current Therapy:    S/p Cycle #6 of RVD  Zometa 4 mg IV every month     Interim History:  Tyler Phillips is back for follow-up. He  continues to do quite well. He has tolerated treatment quite nicely so far.  His last myeloma studies showed an M spike of 0.9g/dL. His IgG level was 1480mg /dL. His Kappa Light chain was 2.04 mg/dL.  His wife is doing better. She had right knee surgery. She now has graduated to no brace at all.Marland Kitchen  He is exercising. He is staying incredibly active. He is doing a lot of work around the house. This weekend, he is using a trench digger to put in a [address] trench in his yard so that he can lay water pipe from his well.  His appetite has been good. He's had no nausea vomiting.  He does state that on occasion, he can get some numbness in his hands and feet. He has nothing that is consistent.  He's had no cough. He's had a problem with bowels or bladder. On occasion, he does get a little constipated but lactulose seems to help..    Overall, his performance status is ECOG 0.  Medications:  Current outpatient prescriptions:  .  aspirin 325 MG EC tablet, Take 325 mg by mouth daily., Disp: , Rfl:  .  calcium carbonate (TUMS - DOSED IN MG ELEMENTAL CALCIUM) 500 MG chewable tablet, Chew 1 tablet by mouth 2 (two) times daily., Disp: , Rfl:  .  dexamethasone (DECADRON) 4 MG tablet, TAKE 5 TABLET AT ONE TIME WITH FOOD FOR 3 WEEKS THEN OFF ONE WEEK *INS ALLOWS 30 DAYS, Disp: 60 tablet, Rfl: 1 .  famciclovir (FAMVIR) 500 MG tablet, Take 1 tablet (500 mg total) by mouth daily., Disp: 30 tablet, Rfl: 6 .  Lactulose 20 GM/30ML SOLN, Take 30 mLs (20 g total) by mouth 4 (four) times daily. (Patient taking differently: Take 30 mLs by mouth as needed. ), Disp: 1000 mL, Rfl: 5 .  lenalidomide (REVLIMID) 25 MG capsule, Take 1 capsule (25 mg  total) by mouth daily. Take for 21 days and 7 days off. [id number], Disp: 21 capsule, Rfl: 0 .  LORazepam (ATIVAN) 0.5 MG tablet, Take 1 tablet (0.5 mg total) by mouth every 6 (six) hours as needed (Nausea or vomiting)., Disp: 60 tablet, Rfl: 0 .  prochlorperazine (COMPAZINE) 10 MG tablet, Take 1 tablet (10 mg total) by mouth every 6 (six) hours as needed (Nausea or vomiting)., Disp: 30 tablet, Rfl: 6 .  zolpidem (AMBIEN) 10 MG tablet, Take 10 mg by mouth at bedtime as needed. , Disp: , Rfl: 0  Allergies: No Known Allergies  Past Medical History, Surgical history, Social history, and Family History were reviewed and updated.  Review of Systems: As above  Physical Exam:  oral temperature is 97.4 F (36.3 C). His blood pressure is 144/90 and his pulse is 69.   Wt Readings from Last 3 Encounters:  01/31/15 216 lb (97.977 kg)  01/03/15 217 lb (98.431 kg)  12/06/14 211 lb (95.709 kg)     Well-developed and well-nourished white chum in no obvious distress. Head and neck exam shows no ocular or oral lesions. There are no palpable cervical or supraclavicular lymph nodes. Lungs are clear. Cardiac exam regular rate and rhythm with no murmurs, rubs or bruits. Abdomen is soft.  He has good bowel sounds. There is no fluid wave. There is no palpable hepatomegaly. His spleen tip might be palpable at the left costal margin. Extremities shows no clubbing, cyanosis or edema. He may have some trace edema in his lower legs. Skin exam shows no rashes, ecchymoses or petechia. Neurological exam is nonfocal.  Lab Results  Component Value Date   WBC 4.6 02/28/2015   HGB 12.9* 02/28/2015   HCT 36.9* 02/28/2015   MCV 97 02/28/2015   PLT 242 02/28/2015     Chemistry      Component Value Date/Time   NA 146* 02/28/2015 1324   NA 137 10/25/2014 1103   K 4.4 02/28/2015 1324   K 5.2 10/25/2014 1103   CL 104 02/28/2015 1324   CL 105 10/25/2014 1103   CO2 26 02/28/2015 1324   CO2 21 10/25/2014 1103   BUN  19 02/28/2015 1324   BUN 18 10/25/2014 1103   CREATININE 1.3* 02/28/2015 1324   CREATININE 1.15 10/25/2014 1103      Component Value Date/Time   CALCIUM 8.8 02/28/2015 1324   CALCIUM 8.5 10/25/2014 1103   ALKPHOS 51 02/28/2015 1324   ALKPHOS 68 10/25/2014 1103   AST 22 02/28/2015 1324   AST 23 10/25/2014 1103   ALT 43 02/28/2015 1324   ALT 53 10/25/2014 1103   BILITOT 0.70 02/28/2015 1324   BILITOT 0.6 10/25/2014 1103         Impression and Plan: Tyler Phillips is 49 year old gentleman with IgG Kappa myeloma. He has extensive marrow involvement. His beta-2 microglobulin was almost 19. This by the highest that I have seen for a patient with myeloma.  I am a little bit worried about the increase in his total protein. We will have to see what the M spike is. His last M spike was down to 0.9 g/dL.  I am encouraged that his hemoglobin continues to improve.  I think if we find that his M spike is increasing, and we may have to reevaluate him. We may need another bone marrow test. We clearly would have to change therapies on him. I might consider the Cytoxan/Kyprolis protocol. We've had really good success with this.  For now, we will just keep his appointments set up for one month.  He and his family might be going out [place] for Christmas. We may have to work around this which should not be a problem.  I spent about 35 minutes with he and his wife.   [name], MD 11/11/20165:12 PM

## 2015-02-28 NOTE — Patient Instructions (Signed)
Bortezomib injection What is this medicine? BORTEZOMIB (bor TEZ oh mib) is a medicine that targets proteins in cancer cells and stops the cancer cells from growing. It is used to treat multiple myeloma and mantle-cell lymphoma. This medicine may be used for other purposes; ask your health care provider or pharmacist if you have questions. What should I tell my health care provider before I take this medicine? They need to know if you have any of these conditions: -diabetes -heart disease -irregular heartbeat -liver disease -on hemodialysis -low blood counts, like low white blood cells, platelets, or hemoglobin -peripheral neuropathy -taking medicine for blood pressure -an unusual or allergic reaction to bortezomib, mannitol, boron, other medicines, foods, dyes, or preservatives -pregnant or trying to get pregnant -breast-feeding How should I use this medicine? This medicine is for injection into a vein or for injection under the skin. It is given by a health care professional in a hospital or clinic setting. Talk to your pediatrician regarding the use of this medicine in children. Special care may be needed. Overdosage: If you think you have taken too much of this medicine contact a poison control center or emergency room at once. NOTE: This medicine is only for you. Do not share this medicine with others. What if I miss a dose? It is important not to miss your dose. Call your doctor or health care professional if you are unable to keep an appointment. What may interact with this medicine? This medicine may interact with the following medications: -ketoconazole -rifampin -ritonavir -St. John's Wort This list may not describe all possible interactions. Give your health care provider a list of all the medicines, herbs, non-prescription drugs, or dietary supplements you use. Also tell them if you smoke, drink alcohol, or use illegal drugs. Some items may interact with your medicine. What  should I watch for while using this medicine? Visit your doctor for checks on your progress. This drug may make you feel generally unwell. This is not uncommon, as chemotherapy can affect healthy cells as well as cancer cells. Report any side effects. Continue your course of treatment even though you feel ill unless your doctor tells you to stop. You may get drowsy or dizzy. Do not drive, use machinery, or do anything that needs mental alertness until you know how this medicine affects you. Do not stand or sit up quickly, especially if you are an older patient. This reduces the risk of dizzy or fainting spells. In some cases, you may be given additional medicines to help with side effects. Follow all directions for their use. Call your doctor or health care professional for advice if you get a fever, chills or sore throat, or other symptoms of a cold or flu. Do not treat yourself. This drug decreases your body's ability to fight infections. Try to avoid being around people who are sick. This medicine may increase your risk to bruise or bleed. Call your doctor or health care professional if you notice any unusual bleeding. You may need blood work done while you are taking this medicine. In some patients, this medicine may cause a serious brain infection that may cause death. If you have any problems seeing, thinking, speaking, walking, or standing, tell your doctor right away. If you cannot reach your doctor, urgently seek other source of medical care. Do not become pregnant while taking this medicine. Women should inform their doctor if they wish to become pregnant or think they might be pregnant. There is a potential for serious  side effects to an unborn child. Talk to your health care professional or pharmacist for more information. Do not breast-feed an infant while taking this medicine. Check with your doctor or health care professional if you get an attack of severe diarrhea, nausea and vomiting, or if  you sweat a lot. The loss of too much body fluid can make it dangerous for you to take this medicine. What side effects may I notice from receiving this medicine? Side effects that you should report to your doctor or health care professional as soon as possible: -allergic reactions like skin rash, itching or hives, swelling of the face, lips, or tongue -breathing problems -changes in hearing -changes in vision -fast, irregular heartbeat -feeling faint or lightheaded, falls -pain, tingling, numbness in the hands or feet -right upper belly pain -seizures -swelling of the ankles, feet, hands -unusual bleeding or bruising -unusually weak or tired -vomiting -yellowing of the eyes or skin Side effects that usually do not require medical attention (report to your doctor or health care professional if they continue or are bothersome): -changes in emotions or moods -constipation -diarrhea -loss of appetite -headache -irritation at site where injected -nausea This list may not describe all possible side effects. Call your doctor for medical advice about side effects. You may report side effects to FDA at 1-800-FDA-1088. Where should I keep my medicine? This drug is given in a hospital or clinic and will not be stored at home. NOTE: This sheet is a summary. It may not cover all possible information. If you have questions about this medicine, talk to your doctor, pharmacist, or health care provider.    2016, [employer] Standard. (2014-06-04 14:47:04)

## 2015-03-04 LAB — PROTEIN ELECTROPHORESIS, SERUM, WITH REFLEX
Abnormal Protein Band1: 0.8 g/dL
Albumin ELP: 4.6 g/dL (ref 3.8–4.8)
Alpha-1-Globulin: 0.3 g/dL (ref 0.2–0.3)
Alpha-2-Globulin: 0.7 g/dL (ref 0.5–0.9)
Beta 2: 0.3 g/dL (ref 0.2–0.5)
Beta Globulin: 0.4 g/dL (ref 0.4–0.6)
Gamma Globulin: 1.3 g/dL (ref 0.8–1.7)
Total Protein, Serum Electrophoresis: 7.5 g/dL (ref 6.1–8.1)

## 2015-03-04 LAB — KAPPA/LAMBDA LIGHT CHAINS
Kappa free light chain: 2.42 mg/dL — ABNORMAL HIGH (ref 0.33–1.94)
Kappa:Lambda Ratio: 1.97 — ABNORMAL HIGH (ref 0.26–1.65)
Lambda Free Lght Chn: 1.23 mg/dL (ref 0.57–2.63)

## 2015-03-04 LAB — IFE INTERPRETATION

## 2015-03-04 LAB — IGG, IGA, IGM
IgA: 106 mg/dL (ref 68–379)
IgG (Immunoglobin G), Serum: 1280 mg/dL (ref 650–1600)
IgM, Serum: 40 mg/dL — ABNORMAL LOW (ref 41–251)

## 2015-03-04 LAB — BETA 2 MICROGLOBULIN, SERUM: Beta-2 Microglobulin: 2.48 mg/L (ref <=2.51)

## 2015-03-05 ENCOUNTER — Telehealth: Payer: Self-pay | Admitting: Hematology & Oncology

## 2015-03-05 NOTE — Telephone Encounter (Signed)
Faxed medical records to :  [facility] D[facility] [place]: [id number] F: [phone]   Case: [id number]  Req: 12/19/2014 to present     COPY SCANNED

## 2015-03-06 ENCOUNTER — Encounter: Payer: Self-pay | Admitting: Nurse Practitioner

## 2015-03-06 ENCOUNTER — Other Ambulatory Visit: Payer: Self-pay | Admitting: Nurse Practitioner

## 2015-03-06 DIAGNOSIS — C9000 Multiple myeloma not having achieved remission: Secondary | ICD-10-CM

## 2015-03-06 MED ORDER — LENALIDOMIDE 25 MG PO CAPS: 25.0000 mg | ORAL_CAPSULE | Freq: Every day | ORAL | Status: DC

## 2015-03-06 NOTE — Progress Notes (Signed)
Received a notification from [place], [place] from Dr. Georga Hacking office and informed us that patient has an appointment on 12/1 @1330 . Information has been faxed to [phone] and confirmation received.

## 2015-03-07 ENCOUNTER — Ambulatory Visit (HOSPITAL_BASED_OUTPATIENT_CLINIC_OR_DEPARTMENT_OTHER): Payer: BLUE CROSS/BLUE SHIELD

## 2015-03-07 ENCOUNTER — Other Ambulatory Visit (HOSPITAL_BASED_OUTPATIENT_CLINIC_OR_DEPARTMENT_OTHER): Payer: BLUE CROSS/BLUE SHIELD

## 2015-03-07 VITALS — BP 117/60 | HR 60 | Temp 98.1°F | Resp 18

## 2015-03-07 DIAGNOSIS — Z5112 Encounter for antineoplastic immunotherapy: Secondary | ICD-10-CM

## 2015-03-07 DIAGNOSIS — C9000 Multiple myeloma not having achieved remission: Secondary | ICD-10-CM

## 2015-03-07 LAB — CBC WITH DIFFERENTIAL (CANCER CENTER ONLY)
BASO#: 0 10*3/uL (ref 0.0–0.2)
BASO%: 0.3 % (ref 0.0–2.0)
EOS%: 2.2 % (ref 0.0–7.0)
Eosinophils Absolute: 0.1 10*3/uL (ref 0.0–0.5)
HCT: 37.4 % — ABNORMAL LOW (ref 38.7–49.9)
HGB: 13.2 g/dL (ref 13.0–17.1)
LYMPH#: 0.8 10*3/uL — ABNORMAL LOW (ref 0.9–3.3)
LYMPH%: 26 % (ref 14.0–48.0)
MCH: 33.7 pg — ABNORMAL HIGH (ref 28.0–33.4)
MCHC: 35.3 g/dL (ref 32.0–35.9)
MCV: 95 fL (ref 82–98)
MONO#: 0.3 10*3/uL (ref 0.1–0.9)
MONO%: 8.6 % (ref 0.0–13.0)
NEUT#: 2 10*3/uL (ref 1.5–6.5)
NEUT%: 62.9 % (ref 40.0–80.0)
Platelets: 126 10*3/uL — ABNORMAL LOW (ref 145–400)
RBC: 3.92 10*6/uL — ABNORMAL LOW (ref 4.20–5.70)
RDW: 13.6 % (ref 11.1–15.7)
WBC: 3.2 10*3/uL — ABNORMAL LOW (ref 4.0–10.0)

## 2015-03-07 LAB — CMP (CANCER CENTER ONLY)
ALT(SGPT): 49 U/L — ABNORMAL HIGH (ref 10–47)
AST: 32 U/L (ref 11–38)
Albumin: 3.4 g/dL (ref 3.3–5.5)
Alkaline Phosphatase: 53 U/L (ref 26–84)
BUN, Bld: 17 mg/dL (ref 7–22)
CO2: 26 mEq/L (ref 18–33)
Calcium: 8.8 mg/dL (ref 8.0–10.3)
Chloride: 104 mEq/L (ref 98–108)
Creat: 1.3 mg/dl — ABNORMAL HIGH (ref 0.6–1.2)
Glucose, Bld: 105 mg/dL (ref 73–118)
Potassium: 3.8 mEq/L (ref 3.3–4.7)
Sodium: 141 mEq/L (ref 128–145)
Total Bilirubin: 0.9 mg/dl (ref 0.20–1.60)
Total Protein: 7.2 g/dL (ref 6.4–8.1)

## 2015-03-07 MED ORDER — ONDANSETRON HCL 8 MG PO TABS: 8.0000 mg | ORAL_TABLET | Freq: Once | ORAL | Status: DC

## 2015-03-07 MED ORDER — ONDANSETRON HCL 8 MG PO TABS
ORAL_TABLET | ORAL | Status: AC
  Filled 2015-03-07: qty 1

## 2015-03-07 MED ORDER — BORTEZOMIB CHEMO SQ INJECTION 3.5 MG (2.5MG/ML)
1.3000 mg/m2 | Freq: Once | INTRAMUSCULAR | Status: AC
  Administered 2015-03-07: 2.75 mg via SUBCUTANEOUS
  Filled 2015-03-07: qty 2.75

## 2015-03-07 NOTE — Patient Instructions (Signed)
Bortezomib injection What is this medicine? BORTEZOMIB (bor TEZ oh mib) is a medicine that targets proteins in cancer cells and stops the cancer cells from growing. It is used to treat multiple myeloma and mantle-cell lymphoma. This medicine may be used for other purposes; ask your health care provider or pharmacist if you have questions. What should I tell my health care provider before I take this medicine? They need to know if you have any of these conditions: -diabetes -heart disease -irregular heartbeat -liver disease -on hemodialysis -low blood counts, like low white blood cells, platelets, or hemoglobin -peripheral neuropathy -taking medicine for blood pressure -an unusual or allergic reaction to bortezomib, mannitol, boron, other medicines, foods, dyes, or preservatives -pregnant or trying to get pregnant -breast-feeding How should I use this medicine? This medicine is for injection into a vein or for injection under the skin. It is given by a health care professional in a hospital or clinic setting. Talk to your pediatrician regarding the use of this medicine in children. Special care may be needed. Overdosage: If you think you have taken too much of this medicine contact a poison control center or emergency room at once. NOTE: This medicine is only for you. Do not share this medicine with others. What if I miss a dose? It is important not to miss your dose. Call your doctor or health care professional if you are unable to keep an appointment. What may interact with this medicine? This medicine may interact with the following medications: -ketoconazole -rifampin -ritonavir -St. John's Wort This list may not describe all possible interactions. Give your health care provider a list of all the medicines, herbs, non-prescription drugs, or dietary supplements you use. Also tell them if you smoke, drink alcohol, or use illegal drugs. Some items may interact with your medicine. What  should I watch for while using this medicine? Visit your doctor for checks on your progress. This drug may make you feel generally unwell. This is not uncommon, as chemotherapy can affect healthy cells as well as cancer cells. Report any side effects. Continue your course of treatment even though you feel ill unless your doctor tells you to stop. You may get drowsy or dizzy. Do not drive, use machinery, or do anything that needs mental alertness until you know how this medicine affects you. Do not stand or sit up quickly, especially if you are an older patient. This reduces the risk of dizzy or fainting spells. In some cases, you may be given additional medicines to help with side effects. Follow all directions for their use. Call your doctor or health care professional for advice if you get a fever, chills or sore throat, or other symptoms of a cold or flu. Do not treat yourself. This drug decreases your body's ability to fight infections. Try to avoid being around people who are sick. This medicine may increase your risk to bruise or bleed. Call your doctor or health care professional if you notice any unusual bleeding. You may need blood work done while you are taking this medicine. In some patients, this medicine may cause a serious brain infection that may cause death. If you have any problems seeing, thinking, speaking, walking, or standing, tell your doctor right away. If you cannot reach your doctor, urgently seek other source of medical care. Do not become pregnant while taking this medicine. Women should inform their doctor if they wish to become pregnant or think they might be pregnant. There is a potential for serious  side effects to an unborn child. Talk to your health care professional or pharmacist for more information. Do not breast-feed an infant while taking this medicine. Check with your doctor or health care professional if you get an attack of severe diarrhea, nausea and vomiting, or if  you sweat a lot. The loss of too much body fluid can make it dangerous for you to take this medicine. What side effects may I notice from receiving this medicine? Side effects that you should report to your doctor or health care professional as soon as possible: -allergic reactions like skin rash, itching or hives, swelling of the face, lips, or tongue -breathing problems -changes in hearing -changes in vision -fast, irregular heartbeat -feeling faint or lightheaded, falls -pain, tingling, numbness in the hands or feet -right upper belly pain -seizures -swelling of the ankles, feet, hands -unusual bleeding or bruising -unusually weak or tired -vomiting -yellowing of the eyes or skin Side effects that usually do not require medical attention (report to your doctor or health care professional if they continue or are bothersome): -changes in emotions or moods -constipation -diarrhea -loss of appetite -headache -irritation at site where injected -nausea This list may not describe all possible side effects. Call your doctor for medical advice about side effects. You may report side effects to FDA at 1-800-FDA-1088. Where should I keep my medicine? This drug is given in a hospital or clinic and will not be stored at home. NOTE: This sheet is a summary. It may not cover all possible information. If you have questions about this medicine, talk to your doctor, pharmacist, or health care provider.    2016, [employer] Standard. (2014-06-04 14:47:04)

## 2015-03-12 ENCOUNTER — Other Ambulatory Visit: Payer: Self-pay | Admitting: *Deleted

## 2015-03-12 DIAGNOSIS — C9000 Multiple myeloma not having achieved remission: Secondary | ICD-10-CM

## 2015-03-14 ENCOUNTER — Other Ambulatory Visit (HOSPITAL_BASED_OUTPATIENT_CLINIC_OR_DEPARTMENT_OTHER): Payer: BLUE CROSS/BLUE SHIELD

## 2015-03-14 ENCOUNTER — Ambulatory Visit (HOSPITAL_BASED_OUTPATIENT_CLINIC_OR_DEPARTMENT_OTHER): Payer: BLUE CROSS/BLUE SHIELD

## 2015-03-14 VITALS — BP 134/77 | HR 68 | Temp 97.7°F | Resp 16

## 2015-03-14 DIAGNOSIS — Z5112 Encounter for antineoplastic immunotherapy: Secondary | ICD-10-CM | POA: Diagnosis not present

## 2015-03-14 DIAGNOSIS — C9000 Multiple myeloma not having achieved remission: Secondary | ICD-10-CM | POA: Diagnosis not present

## 2015-03-14 LAB — CMP (CANCER CENTER ONLY)
ALT(SGPT): 34 U/L (ref 10–47)
AST: 25 U/L (ref 11–38)
Albumin: 3.6 g/dL (ref 3.3–5.5)
Alkaline Phosphatase: 47 U/L (ref 26–84)
BUN, Bld: 18 mg/dL (ref 7–22)
CO2: 28 mEq/L (ref 18–33)
Calcium: 9 mg/dL (ref 8.0–10.3)
Chloride: 104 mEq/L (ref 98–108)
Creat: 1.6 mg/dl — ABNORMAL HIGH (ref 0.6–1.2)
Glucose, Bld: 134 mg/dL — ABNORMAL HIGH (ref 73–118)
Potassium: 4.6 mEq/L (ref 3.3–4.7)
Sodium: 142 mEq/L (ref 128–145)
Total Bilirubin: 0.9 mg/dl (ref 0.20–1.60)
Total Protein: 7.5 g/dL (ref 6.4–8.1)

## 2015-03-14 LAB — CBC WITH DIFFERENTIAL (CANCER CENTER ONLY)
BASO#: 0 10*3/uL (ref 0.0–0.2)
BASO%: 0.2 % (ref 0.0–2.0)
EOS%: 0 % (ref 0.0–7.0)
Eosinophils Absolute: 0 10*3/uL (ref 0.0–0.5)
HCT: 38.6 % — ABNORMAL LOW (ref 38.7–49.9)
HGB: 13.4 g/dL (ref 13.0–17.1)
LYMPH#: 0.6 10*3/uL — ABNORMAL LOW (ref 0.9–3.3)
LYMPH%: 14.3 % (ref 14.0–48.0)
MCH: 32.8 pg (ref 28.0–33.4)
MCHC: 34.7 g/dL (ref 32.0–35.9)
MCV: 94 fL (ref 82–98)
MONO#: 0.1 10*3/uL (ref 0.1–0.9)
MONO%: 2.7 % (ref 0.0–13.0)
NEUT#: 3.4 10*3/uL (ref 1.5–6.5)
NEUT%: 82.8 % — ABNORMAL HIGH (ref 40.0–80.0)
Platelets: 157 10*3/uL (ref 145–400)
RBC: 4.09 10*6/uL — ABNORMAL LOW (ref 4.20–5.70)
RDW: 13.5 % (ref 11.1–15.7)
WBC: 4.1 10*3/uL (ref 4.0–10.0)

## 2015-03-14 MED ORDER — BORTEZOMIB CHEMO SQ INJECTION 3.5 MG (2.5MG/ML)
1.3000 mg/m2 | Freq: Once | INTRAMUSCULAR | Status: AC
  Administered 2015-03-14: 2.75 mg via SUBCUTANEOUS
  Filled 2015-03-14: qty 2.75

## 2015-03-14 MED ORDER — ONDANSETRON HCL 8 MG PO TABS
ORAL_TABLET | ORAL | Status: AC
  Filled 2015-03-14: qty 1

## 2015-03-14 MED ORDER — ONDANSETRON HCL 8 MG PO TABS: 8.0000 mg | ORAL_TABLET | Freq: Once | ORAL | Status: DC

## 2015-03-14 MED ORDER — ZOLEDRONIC ACID 4 MG/100ML IV SOLN
4.0000 mg | Freq: Once | INTRAVENOUS | Status: AC
  Administered 2015-03-14: 4 mg via INTRAVENOUS
  Filled 2015-03-14: qty 100

## 2015-03-14 NOTE — Patient Instructions (Signed)
Bortezomib injection What is this medicine? BORTEZOMIB (bor TEZ oh mib) is a medicine that targets proteins in cancer cells and stops the cancer cells from growing. It is used to treat multiple myeloma and mantle-cell lymphoma. This medicine may be used for other purposes; ask your health care provider or pharmacist if you have questions. What should I tell my health care provider before I take this medicine? They need to know if you have any of these conditions: -diabetes -heart disease -irregular heartbeat -liver disease -on hemodialysis -low blood counts, like low white blood cells, platelets, or hemoglobin -peripheral neuropathy -taking medicine for blood pressure -an unusual or allergic reaction to bortezomib, mannitol, boron, other medicines, foods, dyes, or preservatives -pregnant or trying to get pregnant -breast-feeding How should I use this medicine? This medicine is for injection into a vein or for injection under the skin. It is given by a health care professional in a hospital or clinic setting. Talk to your pediatrician regarding the use of this medicine in children. Special care may be needed. Overdosage: If you think you have taken too much of this medicine contact a poison control center or emergency room at once. NOTE: This medicine is only for you. Do not share this medicine with others. What if I miss a dose? It is important not to miss your dose. Call your doctor or health care professional if you are unable to keep an appointment. What may interact with this medicine? This medicine may interact with the following medications: -ketoconazole -rifampin -ritonavir -St. John's Wort This list may not describe all possible interactions. Give your health care provider a list of all the medicines, herbs, non-prescription drugs, or dietary supplements you use. Also tell them if you smoke, drink alcohol, or use illegal drugs. Some items may interact with your medicine. What  should I watch for while using this medicine? Visit your doctor for checks on your progress. This drug may make you feel generally unwell. This is not uncommon, as chemotherapy can affect healthy cells as well as cancer cells. Report any side effects. Continue your course of treatment even though you feel ill unless your doctor tells you to stop. You may get drowsy or dizzy. Do not drive, use machinery, or do anything that needs mental alertness until you know how this medicine affects you. Do not stand or sit up quickly, especially if you are an older patient. This reduces the risk of dizzy or fainting spells. In some cases, you may be given additional medicines to help with side effects. Follow all directions for their use. Call your doctor or health care professional for advice if you get a fever, chills or sore throat, or other symptoms of a cold or flu. Do not treat yourself. This drug decreases your body's ability to fight infections. Try to avoid being around people who are sick. This medicine may increase your risk to bruise or bleed. Call your doctor or health care professional if you notice any unusual bleeding. You may need blood work done while you are taking this medicine. In some patients, this medicine may cause a serious brain infection that may cause death. If you have any problems seeing, thinking, speaking, walking, or standing, tell your doctor right away. If you cannot reach your doctor, urgently seek other source of medical care. Do not become pregnant while taking this medicine. Women should inform their doctor if they wish to become pregnant or think they might be pregnant. There is a potential for serious  side effects to an unborn child. Talk to your health care professional or pharmacist for more information. Do not breast-feed an infant while taking this medicine. Check with your doctor or health care professional if you get an attack of severe diarrhea, nausea and vomiting, or if  you sweat a lot. The loss of too much body fluid can make it dangerous for you to take this medicine. What side effects may I notice from receiving this medicine? Side effects that you should report to your doctor or health care professional as soon as possible: -allergic reactions like skin rash, itching or hives, swelling of the face, lips, or tongue -breathing problems -changes in hearing -changes in vision -fast, irregular heartbeat -feeling faint or lightheaded, falls -pain, tingling, numbness in the hands or feet -right upper belly pain -seizures -swelling of the ankles, feet, hands -unusual bleeding or bruising -unusually weak or tired -vomiting -yellowing of the eyes or skin Side effects that usually do not require medical attention (report to your doctor or health care professional if they continue or are bothersome): -changes in emotions or moods -constipation -diarrhea -loss of appetite -headache -irritation at site where injected -nausea This list may not describe all possible side effects. Call your doctor for medical advice about side effects. You may report side effects to FDA at 1-800-FDA-1088. Where should I keep my medicine? This drug is given in a hospital or clinic and will not be stored at home. NOTE: This sheet is a summary. It may not cover all possible information. If you have questions about this medicine, talk to your doctor, pharmacist, or health care provider.    2016, Elsevier/Gold Standard. (2014-06-04 14:47:04) Zoledronic Acid injection (Hypercalcemia, Oncology) What is this medicine? ZOLEDRONIC ACID (ZOE le dron ik AS id) lowers the amount of calcium loss from bone. It is used to treat too much calcium in your blood from cancer. It is also used to prevent complications of cancer that has spread to the bone. This medicine may be used for other purposes; ask your health care provider or pharmacist if you have questions. What should I tell my health  care provider before I take this medicine? They need to know if you have any of these conditions: -aspirin-sensitive asthma -cancer, especially if you are receiving medicines used to treat cancer -dental disease or wear dentures -infection -kidney disease -receiving corticosteroids like dexamethasone or prednisone -an unusual or allergic reaction to zoledronic acid, other medicines, foods, dyes, or preservatives -pregnant or trying to get pregnant -breast-feeding How should I use this medicine? This medicine is for infusion into a vein. It is given by a health care professional in a hospital or clinic setting. Talk to your pediatrician regarding the use of this medicine in children. Special care may be needed. Overdosage: If you think you have taken too much of this medicine contact a poison control center or emergency room at once. NOTE: This medicine is only for you. Do not share this medicine with others. What if I miss a dose? It is important not to miss your dose. Call your doctor or health care professional if you are unable to keep an appointment. What may interact with this medicine? -certain antibiotics given by injection -NSAIDs, medicines for pain and inflammation, like ibuprofen or naproxen -some diuretics like bumetanide, furosemide -teriparatide -thalidomide This list may not describe all possible interactions. Give your health care provider a list of all the medicines, herbs, non-prescription drugs, or dietary supplements you use. Also tell them  if you smoke, drink alcohol, or use illegal drugs. Some items may interact with your medicine. What should I watch for while using this medicine? Visit your doctor or health care professional for regular checkups. It may be some time before you see the benefit from this medicine. Do not stop taking your medicine unless your doctor tells you to. Your doctor may order blood tests or other tests to see how you are doing. Women should  inform their doctor if they wish to become pregnant or think they might be pregnant. There is a potential for serious side effects to an unborn child. Talk to your health care professional or pharmacist for more information. You should make sure that you get enough calcium and vitamin D while you are taking this medicine. Discuss the foods you eat and the vitamins you take with your health care professional. Some people who take this medicine have severe bone, joint, and/or muscle pain. This medicine may also increase your risk for jaw problems or a broken thigh bone. Tell your doctor right away if you have severe pain in your jaw, bones, joints, or muscles. Tell your doctor if you have any pain that does not go away or that gets worse. Tell your dentist and dental surgeon that you are taking this medicine. You should not have major dental surgery while on this medicine. See your dentist to have a dental exam and fix any dental problems before starting this medicine. Take good care of your teeth while on this medicine. Make sure you see your dentist for regular follow-up appointments. What side effects may I notice from receiving this medicine? Side effects that you should report to your doctor or health care professional as soon as possible: -allergic reactions like skin rash, itching or hives, swelling of the face, lips, or tongue -anxiety, confusion, or depression -breathing problems -changes in vision -eye pain -feeling faint or lightheaded, falls -jaw pain, especially after dental work -mouth sores -muscle cramps, stiffness, or weakness -redness, blistering, peeling or loosening of the skin, including inside the mouth -trouble passing urine or change in the amount of urine Side effects that usually do not require medical attention (report to your doctor or health care professional if they continue or are bothersome): -bone, joint, or muscle pain -constipation -diarrhea -fever -hair  loss -irritation at site where injected -loss of appetite -nausea, vomiting -stomach upset -trouble sleeping -trouble swallowing -weak or tired This list may not describe all possible side effects. Call your doctor for medical advice about side effects. You may report side effects to FDA at 1-800-FDA-1088. Where should I keep my medicine? This drug is given in a hospital or clinic and will not be stored at home. NOTE: This sheet is a summary. It may not cover all possible information. If you have questions about this medicine, talk to your doctor, pharmacist, or health care provider.    2016, Elsevier/Gold Standard. (2013-09-01 14:19:39)

## 2015-03-27 ENCOUNTER — Other Ambulatory Visit: Payer: Self-pay | Admitting: *Deleted

## 2015-03-27 DIAGNOSIS — C9000 Multiple myeloma not having achieved remission: Secondary | ICD-10-CM

## 2015-03-28 ENCOUNTER — Ambulatory Visit (HOSPITAL_BASED_OUTPATIENT_CLINIC_OR_DEPARTMENT_OTHER): Payer: BLUE CROSS/BLUE SHIELD | Admitting: Hematology & Oncology

## 2015-03-28 ENCOUNTER — Encounter: Payer: Self-pay | Admitting: Hematology & Oncology

## 2015-03-28 ENCOUNTER — Ambulatory Visit (HOSPITAL_BASED_OUTPATIENT_CLINIC_OR_DEPARTMENT_OTHER): Payer: BLUE CROSS/BLUE SHIELD

## 2015-03-28 VITALS — BP 171/86 | HR 79 | Temp 97.3°F | Resp 18 | Ht 72.0 in | Wt 216.0 lb

## 2015-03-28 DIAGNOSIS — C9000 Multiple myeloma not having achieved remission: Secondary | ICD-10-CM

## 2015-03-28 DIAGNOSIS — Z5112 Encounter for antineoplastic immunotherapy: Secondary | ICD-10-CM | POA: Diagnosis not present

## 2015-03-28 DIAGNOSIS — C9001 Multiple myeloma in remission: Secondary | ICD-10-CM

## 2015-03-28 MED ORDER — BORTEZOMIB CHEMO SQ INJECTION 3.5 MG (2.5MG/ML)
1.3000 mg/m2 | Freq: Once | INTRAMUSCULAR | Status: AC
  Administered 2015-03-28: 2.75 mg via SUBCUTANEOUS
  Filled 2015-03-28: qty 2.75

## 2015-03-28 NOTE — Patient Instructions (Signed)
[facility] Discharge Instructions for Patients Receiving Chemotherapy  Today you received the following chemotherapy agents Velcade  To help prevent nausea and vomiting after your treatment, we encourage you to take your nausea medication    If you develop nausea and vomiting that is not controlled by your nausea medication, call the clinic.   BELOW ARE SYMPTOMS THAT SHOULD BE REPORTED IMMEDIATELY:  *FEVER GREATER THAN 100.5 F  *CHILLS WITH OR WITHOUT FEVER  NAUSEA AND VOMITING THAT IS NOT CONTROLLED WITH YOUR NAUSEA MEDICATION  *UNUSUAL SHORTNESS OF BREATH  *UNUSUAL BRUISING OR BLEEDING  TENDERNESS IN MOUTH AND THROAT WITH OR WITHOUT PRESENCE OF ULCERS  *URINARY PROBLEMS  *BOWEL PROBLEMS  UNUSUAL RASH Items with * indicate a potential emergency and should be followed up as soon as possible.  Feel free to call the clinic you have any questions or concerns. The clinic phone number is [phone].  Please show the CHEMO ALERT CARD at check-in to the Emergency Department and triage nurse.   

## 2015-03-28 NOTE — Progress Notes (Signed)
Hematology and Oncology Follow Up Visit  Tyler Phillips [id number] 1965/10/07 49 y.o. 03/28/2015   Principle Diagnosis:   IgG Kappa myeloma  Current Therapy:    S/p Cycle #6 of RVD  Zometa 4 mg IV every month     Interim History:  Tyler Phillips is back for follow-up. He was evaluated at [facility] yesterday. He had a bone marrow test. He had biopsies done. He had x-rays, echocardiogram, pulmonary function test.  According to his schedule, you have the transplant the first week in January.  He is responded adequately well to the Revlimid/Velcade protocol. He currently is off Revlimid.  When we last saw him a month ago, his monoclonal spike was down to 0.8 g/dL. His IgG level was 1280 mg/dL. His Kappa Lightchain was 2.4 to milligrams per deciliter.  He has stated clearly healthy. He is doing a lot of work around the house. He's had no problems with fever. He's had no problem with pain. He's had no issues with bowels or bladder. There's not been any rashes. He's had no headache.  Currently, his performance status is ECOG 0.   Medications:  Current outpatient prescriptions:  .  aspirin 325 MG EC tablet, Take 325 mg by mouth daily., Disp: , Rfl:  .  bortezomib IV (VELCADE) 3.5 MG injection, Inject into the vein., Disp: , Rfl:  .  calcium carbonate (TUMS - DOSED IN MG ELEMENTAL CALCIUM) 500 MG chewable tablet, Chew 1 tablet by mouth 2 (two) times daily., Disp: , Rfl:  .  dexamethasone (DECADRON) 4 MG tablet, TAKE 5 TABLET AT ONE TIME WITH FOOD FOR 3 WEEKS THEN OFF ONE WEEK *INS ALLOWS 30 DAYS, Disp: 60 tablet, Rfl: 1 .  famciclovir (FAMVIR) 500 MG tablet, Take 1 tablet (500 mg total) by mouth daily., Disp: 30 tablet, Rfl: 6 .  filgrastim (NEUPOGEN) 480 MCG/0.8ML SOSY injection, Inject into the skin., Disp: , Rfl:  .  Lactulose 20 GM/30ML SOLN, Take 30 mLs (20 g total) by mouth 4 (four) times daily. (Patient taking differently: Take 30 mLs by mouth as needed. ), Disp: 1000 mL, Rfl: 5 .   lenalidomide (REVLIMID) 25 MG capsule, Take 1 capsule (25 mg total) by mouth daily. Take for 21 days and 7 days off. [id number], Disp: 21 capsule, Rfl: 0 .  LORazepam (ATIVAN) 0.5 MG tablet, Take 1 tablet (0.5 mg total) by mouth every 6 (six) hours as needed (Nausea or vomiting)., Disp: 60 tablet, Rfl: 0 .  prochlorperazine (COMPAZINE) 10 MG tablet, Take 1 tablet (10 mg total) by mouth every 6 (six) hours as needed (Nausea or vomiting)., Disp: 30 tablet, Rfl: 6 .  Vitamin D, Ergocalciferol, (DRISDOL) 50000 UNITS CAPS capsule, Take by mouth., Disp: , Rfl:  .  zolendronic acid (ZOMETA) 4 MG/5ML injection, Inject into the vein., Disp: , Rfl:  .  zolpidem (AMBIEN) 10 MG tablet, Take 10 mg by mouth at bedtime as needed. , Disp: , Rfl: 0  Allergies: No Known Allergies  Past Medical History, Surgical history, Social history, and Family History were reviewed and updated.  Review of Systems: As above  Physical Exam:  height is 6' (1.829 m) and weight is 216 lb (97.977 kg). His oral temperature is 97.3 F (36.3 C). His blood pressure is 171/86 and his pulse is 79. His respiration is 18.   Wt Readings from Last 3 Encounters:  03/28/15 216 lb (97.977 kg)  01/31/15 216 lb (97.977 kg)  01/03/15 217 lb (98.431 kg)     Well-developed  and well-nourished white chum in no obvious distress. Head and neck exam shows no ocular or oral lesions. There are no palpable cervical or supraclavicular lymph nodes. Lungs are clear. Cardiac exam regular rate and rhythm with no murmurs, rubs or bruits. Abdomen is soft. He has good bowel sounds. There is no fluid wave. There is no palpable hepatomegaly. His spleen tip might be palpable at the left costal margin. Extremities shows no clubbing, cyanosis or edema. He may have some trace edema in his lower legs. Skin exam shows no rashes, ecchymoses or petechia. Neurological exam is nonfocal.  Lab Results  Component Value Date   WBC 4.1 03/14/2015   HGB 13.4 03/14/2015    HCT 38.6* 03/14/2015   MCV 94 03/14/2015   PLT 157 03/14/2015     Chemistry      Component Value Date/Time   NA 142 03/14/2015 1134   NA 137 10/25/2014 1103   K 4.6 03/14/2015 1134   K 5.2 10/25/2014 1103   CL 104 03/14/2015 1134   CL 105 10/25/2014 1103   CO2 28 03/14/2015 1134   CO2 21 10/25/2014 1103   BUN 18 03/14/2015 1134   BUN 18 10/25/2014 1103   CREATININE 1.6* 03/14/2015 1134   CREATININE 1.15 10/25/2014 1103      Component Value Date/Time   CALCIUM 9.0 03/14/2015 1134   CALCIUM 8.5 10/25/2014 1103   ALKPHOS 47 03/14/2015 1134   ALKPHOS 68 10/25/2014 1103   AST 25 03/14/2015 1134   AST 23 10/25/2014 1103   ALT 34 03/14/2015 1134   ALT 53 10/25/2014 1103   BILITOT 0.90 03/14/2015 1134   BILITOT 0.6 10/25/2014 1103         Impression and Plan: Tyler Phillips is 49 year old gentleman with IgG Kappa myeloma. He has extensive marrow involvement. His beta-2 microglobulin was almost 19. This by the highest that I have seen for a patient with myeloma.  I am very impressed as quickly [facility] is moving through with his transplant. I think is great that he'll have a transplant within a month.  He will get his last dose of Velcade today.  We will then plan to see him back after his transplant. He will definitely need maintenance therapy with Revlimid. We will have to see what his chromosome studies look like. He had a bone marrow biopsy done yesterday at [facility].  I spent about 30 minutes with he and his wife talked to them about the transplant and what to expect afterwards.   [name], MD 12/9/20165:06 PM

## 2015-04-01 LAB — PROTEIN ELECTROPHORESIS, SERUM, WITH REFLEX
Abnormal Protein Band1: 0.9 g/dL
Albumin ELP: 4.3 g/dL (ref 3.8–4.8)
Alpha-1-Globulin: 0.3 g/dL (ref 0.2–0.3)
Alpha-2-Globulin: 0.7 g/dL (ref 0.5–0.9)
Beta 2: 0.3 g/dL (ref 0.2–0.5)
Beta Globulin: 0.4 g/dL (ref 0.4–0.6)
Gamma Globulin: 1.2 g/dL (ref 0.8–1.7)
Total Protein, Serum Electrophoresis: 7 g/dL (ref 6.1–8.1)

## 2015-04-01 LAB — KAPPA/LAMBDA LIGHT CHAINS
Kappa free light chain: 1.52 mg/dL (ref 0.33–1.94)
Kappa:Lambda Ratio: 1.75 — ABNORMAL HIGH (ref 0.26–1.65)
Lambda Free Lght Chn: 0.87 mg/dL (ref 0.57–2.63)

## 2015-04-01 LAB — IGG, IGA, IGM
IgA: 92 mg/dL (ref 68–379)
IgG (Immunoglobin G), Serum: 1430 mg/dL (ref 650–1600)
IgM, Serum: 30 mg/dL — ABNORMAL LOW (ref 41–251)

## 2015-04-01 LAB — IFE INTERPRETATION

## 2015-04-01 LAB — BETA 2 MICROGLOBULIN, SERUM: Beta-2 Microglobulin: 2.21 mg/L (ref <=2.51)

## 2015-04-03 ENCOUNTER — Other Ambulatory Visit: Payer: Self-pay | Admitting: *Deleted

## 2015-04-03 DIAGNOSIS — C9000 Multiple myeloma not having achieved remission: Secondary | ICD-10-CM | POA: Insufficient documentation

## 2015-04-03 DIAGNOSIS — C9001 Multiple myeloma in remission: Secondary | ICD-10-CM

## 2015-04-04 ENCOUNTER — Other Ambulatory Visit (HOSPITAL_BASED_OUTPATIENT_CLINIC_OR_DEPARTMENT_OTHER): Payer: BLUE CROSS/BLUE SHIELD

## 2015-04-04 ENCOUNTER — Ambulatory Visit (HOSPITAL_BASED_OUTPATIENT_CLINIC_OR_DEPARTMENT_OTHER): Payer: BLUE CROSS/BLUE SHIELD

## 2015-04-04 ENCOUNTER — Other Ambulatory Visit: Payer: Self-pay | Admitting: Hematology & Oncology

## 2015-04-04 VITALS — BP 133/88 | HR 68 | Temp 97.9°F | Resp 18

## 2015-04-04 DIAGNOSIS — C9001 Multiple myeloma in remission: Secondary | ICD-10-CM

## 2015-04-04 DIAGNOSIS — C9000 Multiple myeloma not having achieved remission: Secondary | ICD-10-CM

## 2015-04-04 DIAGNOSIS — Z5112 Encounter for antineoplastic immunotherapy: Secondary | ICD-10-CM

## 2015-04-04 LAB — CBC WITH DIFFERENTIAL (CANCER CENTER ONLY)
BASO#: 0 10*3/uL (ref 0.0–0.2)
BASO%: 0 % (ref 0.0–2.0)
EOS%: 0 % (ref 0.0–7.0)
Eosinophils Absolute: 0 10*3/uL (ref 0.0–0.5)
HCT: 39.9 % (ref 38.7–49.9)
HGB: 13.7 g/dL (ref 13.0–17.1)
LYMPH#: 0.6 10*3/uL — ABNORMAL LOW (ref 0.9–3.3)
LYMPH%: 9.6 % — ABNORMAL LOW (ref 14.0–48.0)
MCH: 33 pg (ref 28.0–33.4)
MCHC: 34.3 g/dL (ref 32.0–35.9)
MCV: 96 fL (ref 82–98)
MONO#: 0.1 10*3/uL (ref 0.1–0.9)
MONO%: 2.1 % (ref 0.0–13.0)
NEUT#: 5.1 10*3/uL (ref 1.5–6.5)
NEUT%: 88.3 % — ABNORMAL HIGH (ref 40.0–80.0)
Platelets: 161 10*3/uL (ref 145–400)
RBC: 4.15 10*6/uL — ABNORMAL LOW (ref 4.20–5.70)
RDW: 13.7 % (ref 11.1–15.7)
WBC: 5.8 10*3/uL (ref 4.0–10.0)

## 2015-04-04 LAB — CMP (CANCER CENTER ONLY)
ALT(SGPT): 34 U/L (ref 10–47)
AST: 25 U/L (ref 11–38)
Albumin: 3.9 g/dL (ref 3.3–5.5)
Alkaline Phosphatase: 46 U/L (ref 26–84)
BUN, Bld: 18 mg/dL (ref 7–22)
CO2: 26 mEq/L (ref 18–33)
Calcium: 9.3 mg/dL (ref 8.0–10.3)
Chloride: 100 mEq/L (ref 98–108)
Creat: 1.5 mg/dl — ABNORMAL HIGH (ref 0.6–1.2)
Glucose, Bld: 102 mg/dL (ref 73–118)
Potassium: 4.6 mEq/L (ref 3.3–4.7)
Sodium: 137 mEq/L (ref 128–145)
Total Bilirubin: 0.8 mg/dl (ref 0.20–1.60)
Total Protein: 7.7 g/dL (ref 6.4–8.1)

## 2015-04-04 MED ORDER — BORTEZOMIB CHEMO SQ INJECTION 3.5 MG (2.5MG/ML)
1.3000 mg/m2 | Freq: Once | INTRAMUSCULAR | Status: AC
  Administered 2015-04-04: 2.75 mg via SUBCUTANEOUS
  Filled 2015-04-04: qty 2.75

## 2015-04-04 NOTE — Patient Instructions (Signed)
[facility] Center Discharge Instructions for Patients Receiving Chemotherapy  Today you received the following chemotherapy agents Velcade  To help prevent nausea and vomiting after your treatment, we encourage you to take your nausea medication    If you develop nausea and vomiting that is not controlled by your nausea medication, call the clinic.   BELOW ARE SYMPTOMS THAT SHOULD BE REPORTED IMMEDIATELY:  *FEVER GREATER THAN 100.5 F  *CHILLS WITH OR WITHOUT FEVER  NAUSEA AND VOMITING THAT IS NOT CONTROLLED WITH YOUR NAUSEA MEDICATION  *UNUSUAL SHORTNESS OF BREATH  *UNUSUAL BRUISING OR BLEEDING  TENDERNESS IN MOUTH AND THROAT WITH OR WITHOUT PRESENCE OF ULCERS  *URINARY PROBLEMS  *BOWEL PROBLEMS  UNUSUAL RASH Items with * indicate a potential emergency and should be followed up as soon as possible.  Feel free to call the clinic you have any questions or concerns. The clinic phone number is [phone].  Please show the CHEMO ALERT CARD at check-in to the Emergency Department and triage nurse.   

## 2015-04-10 ENCOUNTER — Other Ambulatory Visit: Payer: Self-pay | Admitting: *Deleted

## 2015-04-10 DIAGNOSIS — C9001 Multiple myeloma in remission: Secondary | ICD-10-CM

## 2015-04-11 ENCOUNTER — Ambulatory Visit (HOSPITAL_BASED_OUTPATIENT_CLINIC_OR_DEPARTMENT_OTHER): Payer: BLUE CROSS/BLUE SHIELD

## 2015-04-11 ENCOUNTER — Other Ambulatory Visit: Payer: Self-pay | Admitting: Hematology & Oncology

## 2015-04-11 ENCOUNTER — Other Ambulatory Visit (HOSPITAL_BASED_OUTPATIENT_CLINIC_OR_DEPARTMENT_OTHER): Payer: BLUE CROSS/BLUE SHIELD

## 2015-04-11 VITALS — BP 148/73 | HR 68 | Temp 97.9°F | Resp 18

## 2015-04-11 DIAGNOSIS — Z5112 Encounter for antineoplastic immunotherapy: Secondary | ICD-10-CM | POA: Diagnosis not present

## 2015-04-11 DIAGNOSIS — C9000 Multiple myeloma not having achieved remission: Secondary | ICD-10-CM | POA: Diagnosis not present

## 2015-04-11 DIAGNOSIS — C9001 Multiple myeloma in remission: Secondary | ICD-10-CM

## 2015-04-11 DIAGNOSIS — C9002 Multiple myeloma in relapse: Secondary | ICD-10-CM

## 2015-04-11 LAB — CMP (CANCER CENTER ONLY)
ALT(SGPT): 39 U/L (ref 10–47)
AST: 24 U/L (ref 11–38)
Albumin: 3.7 g/dL (ref 3.3–5.5)
Alkaline Phosphatase: 40 U/L (ref 26–84)
BUN, Bld: 22 mg/dL (ref 7–22)
CO2: 27 mEq/L (ref 18–33)
Calcium: 8.9 mg/dL (ref 8.0–10.3)
Chloride: 101 mEq/L (ref 98–108)
Creat: 1.7 mg/dl — ABNORMAL HIGH (ref 0.6–1.2)
Glucose, Bld: 111 mg/dL (ref 73–118)
Potassium: 4.1 mEq/L (ref 3.3–4.7)
Sodium: 141 mEq/L (ref 128–145)
Total Bilirubin: 0.9 mg/dl (ref 0.20–1.60)
Total Protein: 7.8 g/dL (ref 6.4–8.1)

## 2015-04-11 LAB — CBC WITH DIFFERENTIAL (CANCER CENTER ONLY)
BASO#: 0 10*3/uL (ref 0.0–0.2)
BASO%: 0.1 % (ref 0.0–2.0)
EOS%: 0.6 % (ref 0.0–7.0)
Eosinophils Absolute: 0 10*3/uL (ref 0.0–0.5)
HCT: 40.6 % (ref 38.7–49.9)
HGB: 13.9 g/dL (ref 13.0–17.1)
LYMPH#: 1.6 10*3/uL (ref 0.9–3.3)
LYMPH%: 22.6 % (ref 14.0–48.0)
MCH: 32.9 pg (ref 28.0–33.4)
MCHC: 34.2 g/dL (ref 32.0–35.9)
MCV: 96 fL (ref 82–98)
MONO#: 0.5 10*3/uL (ref 0.1–0.9)
MONO%: 6.5 % (ref 0.0–13.0)
NEUT#: 5.1 10*3/uL (ref 1.5–6.5)
NEUT%: 70.2 % (ref 40.0–80.0)
Platelets: 161 10*3/uL (ref 145–400)
RBC: 4.23 10*6/uL (ref 4.20–5.70)
RDW: 13.6 % (ref 11.1–15.7)
WBC: 7.2 10*3/uL (ref 4.0–10.0)

## 2015-04-11 MED ORDER — ZOLEDRONIC ACID 4 MG/100ML IV SOLN
4.0000 mg | Freq: Once | INTRAVENOUS | Status: AC
Start: 2015-04-11 — End: 2015-04-11
  Administered 2015-04-11: 4 mg via INTRAVENOUS
  Filled 2015-04-11: qty 100

## 2015-04-11 MED ORDER — BORTEZOMIB CHEMO SQ INJECTION 3.5 MG (2.5MG/ML)
1.3000 mg/m2 | Freq: Once | INTRAMUSCULAR | Status: AC
  Administered 2015-04-11: 2.75 mg via SUBCUTANEOUS
  Filled 2015-04-11: qty 2.75

## 2015-04-11 NOTE — Patient Instructions (Signed)
Zoledronic Acid injection (Hypercalcemia, Oncology)  What is this medicine?  ZOLEDRONIC ACID (ZOE le dron ik AS id) lowers the amount of calcium loss from bone. It is used to treat too much calcium in your blood from cancer. It is also used to prevent complications of cancer that has spread to the bone.  This medicine may be used for other purposes; ask your health care provider or pharmacist if you have questions.  What should I tell my health care provider before I take this medicine?  They need to know if you have any of these conditions:  -aspirin-sensitive asthma  -cancer, especially if you are receiving medicines used to treat cancer  -dental disease or wear dentures  -infection  -kidney disease  -receiving corticosteroids like dexamethasone or prednisone  -an unusual or allergic reaction to zoledronic acid, other medicines, foods, dyes, or preservatives  -pregnant or trying to get pregnant  -breast-feeding  How should I use this medicine?  This medicine is for infusion into a vein. It is given by a health care professional in a hospital or clinic setting.  Talk to your pediatrician regarding the use of this medicine in children. Special care may be needed.  Overdosage: If you think you have taken too much of this medicine contact a poison control center or emergency room at once.  NOTE: This medicine is only for you. Do not share this medicine with others.  What if I miss a dose?  It is important not to miss your dose. Call your doctor or health care professional if you are unable to keep an appointment.  What may interact with this medicine?  -certain antibiotics given by injection  -NSAIDs, medicines for pain and inflammation, like ibuprofen or naproxen  -some diuretics like bumetanide, furosemide  -teriparatide  -thalidomide  This list may not describe all possible interactions. Give your health care provider a list of all the medicines, herbs, non-prescription drugs, or dietary supplements you use. Also  tell them if you smoke, drink alcohol, or use illegal drugs. Some items may interact with your medicine.  What should I watch for while using this medicine?  Visit your doctor or health care professional for regular checkups. It may be some time before you see the benefit from this medicine. Do not stop taking your medicine unless your doctor tells you to. Your doctor may order blood tests or other tests to see how you are doing.  Women should inform their doctor if they wish to become pregnant or think they might be pregnant. There is a potential for serious side effects to an unborn child. Talk to your health care professional or pharmacist for more information.  You should make sure that you get enough calcium and vitamin D while you are taking this medicine. Discuss the foods you eat and the vitamins you take with your health care professional.  Some people who take this medicine have severe bone, joint, and/or muscle pain. This medicine may also increase your risk for jaw problems or a broken thigh bone. Tell your doctor right away if you have severe pain in your jaw, bones, joints, or muscles. Tell your doctor if you have any pain that does not go away or that gets worse.  Tell your dentist and dental surgeon that you are taking this medicine. You should not have major dental surgery while on this medicine. See your dentist to have a dental exam and fix any dental problems before starting this medicine. Take good care   of your teeth while on this medicine. Make sure you see your dentist for regular follow-up appointments.  What side effects may I notice from receiving this medicine?  Side effects that you should report to your doctor or health care professional as soon as possible:  -allergic reactions like skin rash, itching or hives, swelling of the face, lips, or tongue  -anxiety, confusion, or depression  -breathing problems  -changes in vision  -eye pain  -feeling faint or lightheaded, falls  -jaw pain,  especially after dental work  -mouth sores  -muscle cramps, stiffness, or weakness  -redness, blistering, peeling or loosening of the skin, including inside the mouth  -trouble passing urine or change in the amount of urine  Side effects that usually do not require medical attention (report to your doctor or health care professional if they continue or are bothersome):  -bone, joint, or muscle pain  -constipation  -diarrhea  -fever  -hair loss  -irritation at site where injected  -loss of appetite  -nausea, vomiting  -stomach upset  -trouble sleeping  -trouble swallowing  -weak or tired  This list may not describe all possible side effects. Call your doctor for medical advice about side effects. You may report side effects to FDA at 1-800-FDA-1088.  Where should I keep my medicine?  This drug is given in a hospital or clinic and will not be stored at home.  NOTE: This sheet is a summary. It may not cover all possible information. If you have questions about this medicine, talk to your doctor, pharmacist, or health care provider.      2016, Elsevier/Gold Standard. (2013-09-01 14:19:39)  Bortezomib injection  What is this medicine?  BORTEZOMIB (bor TEZ oh mib) is a medicine that targets proteins in cancer cells and stops the cancer cells from growing. It is used to treat multiple myeloma and mantle-cell lymphoma.  This medicine may be used for other purposes; ask your health care provider or pharmacist if you have questions.  What should I tell my health care provider before I take this medicine?  They need to know if you have any of these conditions:  -diabetes  -heart disease  -irregular heartbeat  -liver disease  -on hemodialysis  -low blood counts, like low white blood cells, platelets, or hemoglobin  -peripheral neuropathy  -taking medicine for blood pressure  -an unusual or allergic reaction to bortezomib, mannitol, boron, other medicines, foods, dyes, or preservatives  -pregnant or trying to get  pregnant  -breast-feeding  How should I use this medicine?  This medicine is for injection into a vein or for injection under the skin. It is given by a health care professional in a hospital or clinic setting.  Talk to your pediatrician regarding the use of this medicine in children. Special care may be needed.  Overdosage: If you think you have taken too much of this medicine contact a poison control center or emergency room at once.  NOTE: This medicine is only for you. Do not share this medicine with others.  What if I miss a dose?  It is important not to miss your dose. Call your doctor or health care professional if you are unable to keep an appointment.  What may interact with this medicine?  This medicine may interact with the following medications:  -ketoconazole  -rifampin  -ritonavir  -St. John's Wort  This list may not describe all possible interactions. Give your health care provider a list of all the medicines, herbs, non-prescription drugs,   or dietary supplements you use. Also tell them if you smoke, drink alcohol, or use illegal drugs. Some items may interact with your medicine.  What should I watch for while using this medicine?  Visit your doctor for checks on your progress. This drug may make you feel generally unwell. This is not uncommon, as chemotherapy can affect healthy cells as well as cancer cells. Report any side effects. Continue your course of treatment even though you feel ill unless your doctor tells you to stop.  You may get drowsy or dizzy. Do not drive, use machinery, or do anything that needs mental alertness until you know how this medicine affects you. Do not stand or sit up quickly, especially if you are an older patient. This reduces the risk of dizzy or fainting spells.  In some cases, you may be given additional medicines to help with side effects. Follow all directions for their use.  Call your doctor or health care professional for advice if you get a fever, chills or sore  throat, or other symptoms of a cold or flu. Do not treat yourself. This drug decreases your body's ability to fight infections. Try to avoid being around people who are sick.  This medicine may increase your risk to bruise or bleed. Call your doctor or health care professional if you notice any unusual bleeding.  You may need blood work done while you are taking this medicine.  In some patients, this medicine may cause a serious brain infection that may cause death. If you have any problems seeing, thinking, speaking, walking, or standing, tell your doctor right away. If you cannot reach your doctor, urgently seek other source of medical care.  Do not become pregnant while taking this medicine. Women should inform their doctor if they wish to become pregnant or think they might be pregnant. There is a potential for serious side effects to an unborn child. Talk to your health care professional or pharmacist for more information. Do not breast-feed an infant while taking this medicine.  Check with your doctor or health care professional if you get an attack of severe diarrhea, nausea and vomiting, or if you sweat a lot. The loss of too much body fluid can make it dangerous for you to take this medicine.  What side effects may I notice from receiving this medicine?  Side effects that you should report to your doctor or health care professional as soon as possible:  -allergic reactions like skin rash, itching or hives, swelling of the face, lips, or tongue  -breathing problems  -changes in hearing  -changes in vision  -fast, irregular heartbeat  -feeling faint or lightheaded, falls  -pain, tingling, numbness in the hands or feet  -right upper belly pain  -seizures  -swelling of the ankles, feet, hands  -unusual bleeding or bruising  -unusually weak or tired  -vomiting  -yellowing of the eyes or skin  Side effects that usually do not require medical attention (report to your doctor or health care professional if they  continue or are bothersome):  -changes in emotions or moods  -constipation  -diarrhea  -loss of appetite  -headache  -irritation at site where injected  -nausea  This list may not describe all possible side effects. Call your doctor for medical advice about side effects. You may report side effects to FDA at 1-800-FDA-1088.  Where should I keep my medicine?  This drug is given in a hospital or clinic and will not be stored at home.

## 2015-04-17 ENCOUNTER — Other Ambulatory Visit: Payer: Self-pay

## 2015-04-18 ENCOUNTER — Other Ambulatory Visit: Payer: Self-pay | Admitting: *Deleted

## 2015-04-18 DIAGNOSIS — C9001 Multiple myeloma in remission: Secondary | ICD-10-CM

## 2015-04-22 ENCOUNTER — Ambulatory Visit (HOSPITAL_BASED_OUTPATIENT_CLINIC_OR_DEPARTMENT_OTHER): Payer: BLUE CROSS/BLUE SHIELD

## 2015-04-22 ENCOUNTER — Other Ambulatory Visit (HOSPITAL_BASED_OUTPATIENT_CLINIC_OR_DEPARTMENT_OTHER): Payer: BLUE CROSS/BLUE SHIELD

## 2015-04-22 ENCOUNTER — Other Ambulatory Visit: Payer: Self-pay | Admitting: Hematology & Oncology

## 2015-04-22 VITALS — BP 138/88 | HR 62 | Temp 98.0°F | Resp 18

## 2015-04-22 DIAGNOSIS — Z5112 Encounter for antineoplastic immunotherapy: Secondary | ICD-10-CM

## 2015-04-22 DIAGNOSIS — C9000 Multiple myeloma not having achieved remission: Secondary | ICD-10-CM

## 2015-04-22 DIAGNOSIS — C9001 Multiple myeloma in remission: Secondary | ICD-10-CM

## 2015-04-22 LAB — CMP (CANCER CENTER ONLY)
ALT(SGPT): 33 U/L (ref 10–47)
AST: 22 U/L (ref 11–38)
Albumin: 3.4 g/dL (ref 3.3–5.5)
Alkaline Phosphatase: 41 U/L (ref 26–84)
BUN, Bld: 18 mg/dL (ref 7–22)
CO2: 26 mEq/L (ref 18–33)
Calcium: 8.7 mg/dL (ref 8.0–10.3)
Chloride: 103 mEq/L (ref 98–108)
Creat: 1.1 mg/dl (ref 0.6–1.2)
Glucose, Bld: 85 mg/dL (ref 73–118)
Potassium: 4 mEq/L (ref 3.3–4.7)
Sodium: 138 mEq/L (ref 128–145)
Total Bilirubin: 1 mg/dl (ref 0.20–1.60)
Total Protein: 7 g/dL (ref 6.4–8.1)

## 2015-04-22 LAB — CBC WITH DIFFERENTIAL (CANCER CENTER ONLY)
BASO#: 0 10*3/uL (ref 0.0–0.2)
BASO%: 0 % (ref 0.0–2.0)
EOS%: 0.9 % (ref 0.0–7.0)
Eosinophils Absolute: 0.1 10*3/uL (ref 0.0–0.5)
HCT: 35.8 % — ABNORMAL LOW (ref 38.7–49.9)
HGB: 12.4 g/dL — ABNORMAL LOW (ref 13.0–17.1)
LYMPH#: 0.3 10*3/uL — ABNORMAL LOW (ref 0.9–3.3)
LYMPH%: 5.7 % — ABNORMAL LOW (ref 14.0–48.0)
MCH: 32.9 pg (ref 28.0–33.4)
MCHC: 34.6 g/dL (ref 32.0–35.9)
MCV: 95 fL (ref 82–98)
MONO#: 0.1 10*3/uL (ref 0.1–0.9)
MONO%: 1.7 % (ref 0.0–13.0)
NEUT#: 5 10*3/uL (ref 1.5–6.5)
NEUT%: 91.7 % — ABNORMAL HIGH (ref 40.0–80.0)
Platelets: 108 10*3/uL — ABNORMAL LOW (ref 145–400)
RBC: 3.77 10*6/uL — ABNORMAL LOW (ref 4.20–5.70)
RDW: 13.3 % (ref 11.1–15.7)
WBC: 5.4 10*3/uL (ref 4.0–10.0)

## 2015-04-22 MED ORDER — BORTEZOMIB CHEMO SQ INJECTION 3.5 MG (2.5MG/ML)
1.3000 mg/m2 | Freq: Once | INTRAMUSCULAR | Status: AC
  Administered 2015-04-22: 2.75 mg via SUBCUTANEOUS
  Filled 2015-04-22: qty 2.75

## 2015-04-22 NOTE — Patient Instructions (Signed)
[facility] Discharge Instructions for Patients Receiving Chemotherapy  Today you received the following chemotherapy agents Velcade  To help prevent nausea and vomiting after your treatment, we encourage you to take your nausea medication    If you develop nausea and vomiting that is not controlled by your nausea medication, call the clinic.   BELOW ARE SYMPTOMS THAT SHOULD BE REPORTED IMMEDIATELY:  *FEVER GREATER THAN 100.5 F  *CHILLS WITH OR WITHOUT FEVER  NAUSEA AND VOMITING THAT IS NOT CONTROLLED WITH YOUR NAUSEA MEDICATION  *UNUSUAL SHORTNESS OF BREATH  *UNUSUAL BRUISING OR BLEEDING  TENDERNESS IN MOUTH AND THROAT WITH OR WITHOUT PRESENCE OF ULCERS  *URINARY PROBLEMS  *BOWEL PROBLEMS  UNUSUAL RASH Items with * indicate a potential emergency and should be followed up as soon as possible.  Feel free to call the clinic you have any questions or concerns. The clinic phone number is [phone].  Please show the CHEMO ALERT CARD at check-in to the Emergency Department and triage nurse.   

## 2015-04-23 ENCOUNTER — Other Ambulatory Visit (HOSPITAL_BASED_OUTPATIENT_CLINIC_OR_DEPARTMENT_OTHER): Payer: BLUE CROSS/BLUE SHIELD

## 2015-04-23 ENCOUNTER — Ambulatory Visit (HOSPITAL_BASED_OUTPATIENT_CLINIC_OR_DEPARTMENT_OTHER): Payer: BLUE CROSS/BLUE SHIELD

## 2015-04-23 VITALS — BP 146/85 | HR 91 | Temp 98.2°F | Resp 16

## 2015-04-23 DIAGNOSIS — C9000 Multiple myeloma not having achieved remission: Secondary | ICD-10-CM

## 2015-04-23 LAB — CBC WITH DIFFERENTIAL (CANCER CENTER ONLY)
BASO#: 0 10*3/uL (ref 0.0–0.2)
BASO%: 0.1 % (ref 0.0–2.0)
EOS%: 0.5 % (ref 0.0–7.0)
Eosinophils Absolute: 0.1 10*3/uL (ref 0.0–0.5)
HCT: 34.7 % — ABNORMAL LOW (ref 38.7–49.9)
HGB: 12.1 g/dL — ABNORMAL LOW (ref 13.0–17.1)
LYMPH#: 0.2 10*3/uL — ABNORMAL LOW (ref 0.9–3.3)
LYMPH%: 2.3 % — ABNORMAL LOW (ref 14.0–48.0)
MCH: 32.8 pg (ref 28.0–33.4)
MCHC: 34.9 g/dL (ref 32.0–35.9)
MCV: 94 fL (ref 82–98)
MONO#: 0.1 10*3/uL (ref 0.1–0.9)
MONO%: 1 % (ref 0.0–13.0)
NEUT#: 9.1 10*3/uL — ABNORMAL HIGH (ref 1.5–6.5)
NEUT%: 96.1 % — ABNORMAL HIGH (ref 40.0–80.0)
Platelets: 79 10*3/uL — ABNORMAL LOW (ref 145–400)
RBC: 3.69 10*6/uL — ABNORMAL LOW (ref 4.20–5.70)
RDW: 13.1 % (ref 11.1–15.7)
WBC: 9.5 10*3/uL (ref 4.0–10.0)

## 2015-04-23 LAB — BASIC METABOLIC PANEL
Anion Gap: 9 mEq/L (ref 3–11)
BUN: 21.1 mg/dL (ref 7.0–26.0)
CO2: 24 mEq/L (ref 22–29)
Calcium: 9 mg/dL (ref 8.4–10.4)
Chloride: 105 mEq/L (ref 98–109)
Creatinine: 1.1 mg/dL (ref 0.7–1.3)
EGFR: 76 mL/min/{1.73_m2} — ABNORMAL LOW (ref >90)
Glucose: 90 mg/dl (ref 70–140)
Potassium: 4.3 mEq/L (ref 3.5–5.1)
Sodium: 138 mEq/L (ref 136–145)

## 2015-04-23 MED ORDER — HEPARIN SOD (PORK) LOCK FLUSH 100 UNIT/ML IV SOLN
500.0000 [IU] | Freq: Once | INTRAVENOUS | Status: AC
  Administered 2015-04-23: 500 [IU] via INTRAVENOUS
  Filled 2015-04-23: qty 5

## 2015-04-23 MED ORDER — SODIUM CHLORIDE 0.9 % IJ SOLN
10.0000 mL | INTRAMUSCULAR | Status: DC | PRN
  Administered 2015-04-23: 10 mL via INTRAVENOUS
  Filled 2015-04-23: qty 10

## 2015-04-23 NOTE — Patient Instructions (Signed)
, Care After °Refer to this sheet in the next few weeks. These instructions provide you with information on caring for yourself after your procedure. Your caregiver may also give you more specific instructions. Your treatment has been planned according to current medical practices, but problems sometimes occur. Call your caregiver if you have any problems or questions after your procedure.  °HOME CARE INSTRUCTIONS °· Rest at home the day of the procedure. You will likely be able to return to normal activities the following day. °· Follow your caregiver's specific instructions for the type of device that you have. °· Only take over-the-counter or prescription medicines as directed by your caregiver. °· Keep the insertion site of the catheter clean and dry at all times. °¨ Change the bandages (dressings) over the catheter site as directed by your caregiver. °¨ Wash the area around the catheter site during each dressing change. Sponge bathe the area using a germ-killing (antiseptic) solution as directed by your caregiver. °¨ Look for redness or swelling at the insertion site during each dressing change. °· Apply an antibiotic ointment as directed by your caregiver. °· Flush your catheter as directed to keep it from becoming clogged. °· Always wash your hands thoroughly before changing dressings or flushing the catheter. °· Do not let air enter the catheter. °¨ Never open the cap at the catheter tip. °¨ Always make sure there is no air in the syringe or in the tubing for infusions.    °· Do not lift anything heavy. °· Do not drive until your caregiver approves. °· Do not shower or bathe until your caregiver approves. When you shower or bathe, place a piece of plastic wrap over the catheter site. Do not allow the catheter site or the dressing to get wet. If taking a bath, do not allow the catheter to get submerged in the water. °If the catheter was inserted through an arm vein:  °· Avoid wearing tight clothes or jewelry  on the arm that has the catheter.   °· Do not sleep with your head on the arm that has the catheter.   °· Do not allow use of a blood pressure cuff on the arm that has the catheter.   °· Do not let anyone draw blood from the arm that has the catheter, except through the catheter itself. °SEEK MEDICAL CARE IF: °· You have bleeding at the insertion site of the catheter.   °· You feel weak or nauseous.   °· Your catheter is not working properly.   °· You have redness, pain, swelling, and warmth at the insertion site.   °· You notice fluid draining from the insertion site.   °SEEK IMMEDIATE MEDICAL CARE IF: °· Your catheter breaks or has a hole in it.   °· Your catheter comes loose or gets pulled completely out. If this happens, hold firm pressure over the area with your hand or a clean cloth.   °· You have a fever. °· You have chills.   °· Your catheter becomes totally blocked.   °· You have swelling in your arm, shoulder, neck, or face.   °· You have bleeding from the insertion site that does not stop.   °· You develop chest pain or have trouble breathing.   °· You feel dizzy or faint.   °MAKE SURE YOU: °· Understand these instructions. °· Will watch your condition. °· Will get help right away if you are not doing well or get worse. °  °This information is not intended to replace advice given to you by your health care provider. Make sure you discuss any questions you   have with your health care provider. °  °Document Released: 03/22/2012 Document Revised: 12/06/2012 Document Reviewed: 03/22/2012 °Elsevier Interactive Patient Education ©2016 [employer] ° °

## 2015-04-25 ENCOUNTER — Ambulatory Visit (HOSPITAL_BASED_OUTPATIENT_CLINIC_OR_DEPARTMENT_OTHER): Payer: BLUE CROSS/BLUE SHIELD

## 2015-04-25 ENCOUNTER — Other Ambulatory Visit (HOSPITAL_BASED_OUTPATIENT_CLINIC_OR_DEPARTMENT_OTHER): Payer: BLUE CROSS/BLUE SHIELD

## 2015-04-25 DIAGNOSIS — C9000 Multiple myeloma not having achieved remission: Secondary | ICD-10-CM

## 2015-04-25 DIAGNOSIS — C9001 Multiple myeloma in remission: Secondary | ICD-10-CM

## 2015-04-25 LAB — CBC WITH DIFFERENTIAL (CANCER CENTER ONLY)
BASO#: 0 10*3/uL (ref 0.0–0.2)
BASO%: 0 % (ref 0.0–2.0)
EOS%: 2.7 % (ref 0.0–7.0)
Eosinophils Absolute: 0 10*3/uL (ref 0.0–0.5)
HCT: 34.9 % — ABNORMAL LOW (ref 38.7–49.9)
HGB: 12.2 g/dL — ABNORMAL LOW (ref 13.0–17.1)
LYMPH#: 0.2 10*3/uL — ABNORMAL LOW (ref 0.9–3.3)
LYMPH%: 18.8 % (ref 14.0–48.0)
MCH: 32.6 pg (ref 28.0–33.4)
MCHC: 35 g/dL (ref 32.0–35.9)
MCV: 93 fL (ref 82–98)
MONO#: 0.1 10*3/uL (ref 0.1–0.9)
MONO%: 8 % (ref 0.0–13.0)
NEUT#: 0.8 10*3/uL — ABNORMAL LOW (ref 1.5–6.5)
NEUT%: 70.5 % (ref 40.0–80.0)
Platelets: 56 10*3/uL — ABNORMAL LOW (ref 145–400)
RBC: 3.74 10*6/uL — ABNORMAL LOW (ref 4.20–5.70)
RDW: 12.9 % (ref 11.1–15.7)
WBC: 1.1 10*3/uL — ABNORMAL LOW (ref 4.0–10.0)

## 2015-04-25 LAB — BASIC METABOLIC PANEL
Anion Gap: 9 mEq/L (ref 3–11)
BUN: 23.1 mg/dL (ref 7.0–26.0)
CO2: 23 mEq/L (ref 22–29)
Calcium: 8.8 mg/dL (ref 8.4–10.4)
Chloride: 102 mEq/L (ref 98–109)
Creatinine: 1.2 mg/dL (ref 0.7–1.3)
EGFR: 71 mL/min/{1.73_m2} — ABNORMAL LOW (ref >90)
Glucose: 84 mg/dl (ref 70–140)
Potassium: 4.3 mEq/L (ref 3.5–5.1)
Sodium: 135 mEq/L — ABNORMAL LOW (ref 136–145)

## 2015-04-25 MED ORDER — SODIUM CHLORIDE 0.9 % IJ SOLN
10.0000 mL | INTRAMUSCULAR | Status: DC | PRN
  Administered 2015-04-25: 10 mL
  Filled 2015-04-25: qty 10

## 2015-04-25 MED ORDER — HEPARIN SOD (PORK) LOCK FLUSH 100 UNIT/ML IV SOLN
250.0000 [IU] | Freq: Once | INTRAVENOUS | Status: AC | PRN
  Administered 2015-04-25: 250 [IU]
  Filled 2015-04-25: qty 5

## 2015-04-25 NOTE — Patient Instructions (Signed)
Tunneled Catheter Insertion °Catheters are thin, flexible tubes that are inserted into a vein to provide access to the bloodstream. A tunneled catheter is used when a person's bloodstream needs to be accessed many times over a long period, usually longer than 30 days. The catheter provides a painless method of drawing blood, giving blood products, removing waste products from the blood (hemodialysis), and giving medicines. Tunneled catheters can be placed in different parts of the body depending on how they will be used. These catheters are secure and easy to access. A part of the catheter is tunneled under the skin. This is done to decrease the risk of infection.  °There are various types of tunneled catheters. The specific one used will depend on your needs. The catheter can be used right after insertion. °LET YOUR HEALTH CARE PROVIDER KNOW ABOUT:  °· Any allergies you have. °· All medicines you are taking, including vitamins, herbs, eyedrops, and over-the-counter medicines and creams.   °· Previous problems you or members of your family have had with the use of anesthetics.   °· Any blood disorders you have had. °· Possibility of pregnancy, if this applies.   °· Other health problems you have. Also, let your health care provider know if you have a pacemaker. °RISKS AND COMPLICATIONS °Generally, tunneled catheter insertion is a safe procedure. However, as with any surgical procedure, complications can occur. Possible complications include: °· Damage to the blood vessel.   °· Bruising or bleeding at the site of puncture.   °· Introduction of the catheter into an artery instead of a vein.   °· Skin infection at the site of catheter insertion.   °· Bloodstream infection, especially if your white blood cell count is low.   °· Developing a kink in the catheter so it does not work properly.   °· Developing a hole or crack in the catheter.   °· Blockage of the catheter.   °· Getting air in the catheter. °· Blood clots  around the catheter or in the vein near the catheter. °· Disturbance in the normal heart rhythm (rare). This is usually temporary.   °· A collapsed lung during insertion (rare).   °BEFORE THE PROCEDURE  °· You may need to have blood tests done before the day of the procedure.   °· Do not eat or drink anything for at least 8 hours before the procedure or as directed by your health care provider.   °· Ask your health care provider about changing or stopping your regular medicines. °· Avoid wearing jewelry the day of the procedure.   °· Make plans to have someone drive you home after the procedure. You should not drive immediately after the procedure.   °PROCEDURE °· You will be asked to lie on your back. °· A regular intravenous (IV) access tube may be put into a vein in your hand or arm. During the procedure, medicine can flow directly into your body through the IV tube. °· Small monitors will be put on your body. They are used to check your heart, blood pressure, and oxygen level. °· The catheter site is usually shaved, cleaned, and covered with a sterile drape. °· You will be given medicine to numb the area where the catheter will be placed (local anesthetic). You may also be given a medicine to help you relax (sedative). °· The health care provider will then make a small incision in the skin, usually in the lower neck. Another small incision is made a little lower, usually on the shoulder or upper chest. Ultrasonography may be used so that the health   care provider can see the vein and can properly guide the catheter placement. °· X-ray equipment may also be used to help ensure that the catheter is inserted safely and is placed where it will function most effectively. This equipment allows the health care provider to watch the catheter on a live display while guiding it into place. °· Generally, a small guidewire is put into the vein first. A tunnel is created under the skin. The health care provider guides the  movement of the guidewire into a larger vein closer to the heart. °· The catheter is pulled through the tunnel and then moved into the larger vein. The cuff on the catheter is located in the tunnel part. The cuff helps to anchor the catheter in place over time. Stitches are used to keep the catheter in place when first put in. °· An X-ray may be done to make sure the catheter is in the right place. °AFTER THE PROCEDURE  °· You may stay in a recovery area until the sedation has worn off. °· Your heart rate, blood pressure and oxygen level will be monitored. °· You may have some pain and swelling in the neck or shoulder. You will likely be given medicine to control this. °· If this was done as an outpatient procedure, you may be able to go home the same day. °  °This information is not intended to replace advice given to you by your health care provider. Make sure you discuss any questions you have with your health care provider. °  °Document Released: 04/25/2007 Document Revised: 04/26/2014 Document Reviewed: 02/16/2012 °Elsevier Interactive Patient Education ©2016 [employer] ° °

## 2015-04-27 ENCOUNTER — Inpatient Hospital Stay (HOSPITAL_BASED_OUTPATIENT_CLINIC_OR_DEPARTMENT_OTHER)
Admission: EM | Admit: 2015-04-27 | Discharge: 2015-04-29 | DRG: 603 | Disposition: A | Discharge status: Other Acute Inpatient Hospital | Payer: BLUE CROSS/BLUE SHIELD | Attending: Internal Medicine | Admitting: Internal Medicine

## 2015-04-27 ENCOUNTER — Encounter (HOSPITAL_BASED_OUTPATIENT_CLINIC_OR_DEPARTMENT_OTHER): Payer: Self-pay | Admitting: *Deleted

## 2015-04-27 DIAGNOSIS — D701 Agranulocytosis secondary to cancer chemotherapy: Secondary | ICD-10-CM | POA: Diagnosis present

## 2015-04-27 DIAGNOSIS — L03213 Periorbital cellulitis: Principal | ICD-10-CM | POA: Diagnosis present

## 2015-04-27 DIAGNOSIS — T451X5A Adverse effect of antineoplastic and immunosuppressive drugs, initial encounter: Secondary | ICD-10-CM | POA: Diagnosis present

## 2015-04-27 DIAGNOSIS — D709 Neutropenia, unspecified: Secondary | ICD-10-CM | POA: Diagnosis not present

## 2015-04-27 DIAGNOSIS — N182 Chronic kidney disease, stage 2 (mild): Secondary | ICD-10-CM | POA: Diagnosis present

## 2015-04-27 DIAGNOSIS — C9001 Multiple myeloma in remission: Secondary | ICD-10-CM

## 2015-04-27 DIAGNOSIS — Z79899 Other long term (current) drug therapy: Secondary | ICD-10-CM | POA: Diagnosis not present

## 2015-04-27 DIAGNOSIS — D6959 Other secondary thrombocytopenia: Secondary | ICD-10-CM | POA: Diagnosis present

## 2015-04-27 DIAGNOSIS — D6481 Anemia due to antineoplastic chemotherapy: Secondary | ICD-10-CM | POA: Diagnosis present

## 2015-04-27 DIAGNOSIS — Z7982 Long term (current) use of aspirin: Secondary | ICD-10-CM | POA: Diagnosis not present

## 2015-04-27 DIAGNOSIS — C9000 Multiple myeloma not having achieved remission: Secondary | ICD-10-CM | POA: Diagnosis present

## 2015-04-27 DIAGNOSIS — D702 Other drug-induced agranulocytosis: Secondary | ICD-10-CM | POA: Diagnosis present

## 2015-04-27 LAB — PHOSPHORUS: Phosphorus: 2.8 mg/dL (ref 2.5–4.6)

## 2015-04-27 LAB — CBC WITH DIFFERENTIAL/PLATELET
Basophils Absolute: 0 10*3/uL (ref 0.0–0.1)
Basophils Absolute: 0 10*3/uL (ref 0.0–0.1)
Basophils Relative: 4 %
Basophils Relative: 0 %
Eosinophils Absolute: 0 10*3/uL (ref 0.0–0.7)
Eosinophils Absolute: 0 10*3/uL (ref 0.0–0.7)
Eosinophils Relative: 4 %
Eosinophils Relative: 3 %
HCT: 32.6 % — ABNORMAL LOW (ref 39.0–52.0)
HCT: 32.8 % — ABNORMAL LOW (ref 39.0–52.0)
Hemoglobin: 11 g/dL — ABNORMAL LOW (ref 13.0–17.0)
Hemoglobin: 11.3 g/dL — ABNORMAL LOW (ref 13.0–17.0)
Lymphocytes Relative: 64 %
Lymphocytes Relative: 73 %
Lymphs Abs: 0.2 10*3/uL — ABNORMAL LOW (ref 0.7–4.0)
Lymphs Abs: 0.2 10*3/uL — ABNORMAL LOW (ref 0.7–4.0)
MCH: 31.4 pg (ref 26.0–34.0)
MCH: 32.3 pg (ref 26.0–34.0)
MCHC: 33.7 g/dL (ref 30.0–36.0)
MCHC: 34.5 g/dL (ref 30.0–36.0)
MCV: 93.1 fL (ref 78.0–100.0)
MCV: 93.7 fL (ref 78.0–100.0)
Monocytes Absolute: 0.1 10*3/uL (ref 0.1–1.0)
Monocytes Absolute: 0.1 10*3/uL (ref 0.1–1.0)
Monocytes Relative: 24 %
Monocytes Relative: 17 %
Neutro Abs: 0 10*3/uL — ABNORMAL LOW (ref 1.7–7.7)
Neutro Abs: 0 10*3/uL — ABNORMAL LOW (ref 1.7–7.7)
Neutrophils Relative %: 4 %
Neutrophils Relative %: 7 %
Platelets: 53 10*3/uL — ABNORMAL LOW (ref 150–400)
Platelets: 57 10*3/uL — ABNORMAL LOW (ref 150–400)
RBC: 3.5 MIL/uL — ABNORMAL LOW (ref 4.22–5.81)
RBC: 3.5 MIL/uL — ABNORMAL LOW (ref 4.22–5.81)
RDW: 12.8 % (ref 11.5–15.5)
RDW: 13.3 % (ref 11.5–15.5)
WBC: 0.3 10*3/uL — CL (ref 4.0–10.5)
WBC: 0.3 10*3/uL — CL (ref 4.0–10.5)

## 2015-04-27 LAB — COMPREHENSIVE METABOLIC PANEL
ALT: 22 U/L (ref 17–63)
ALT: 21 U/L (ref 17–63)
AST: 13 U/L — ABNORMAL LOW (ref 15–41)
AST: 10 U/L — ABNORMAL LOW (ref 15–41)
Albumin: 4 g/dL (ref 3.5–5.0)
Albumin: 4 g/dL (ref 3.5–5.0)
Alkaline Phosphatase: 52 U/L (ref 38–126)
Alkaline Phosphatase: 50 U/L (ref 38–126)
Anion gap: 6 (ref 5–15)
Anion gap: 8 (ref 5–15)
BUN: 16 mg/dL (ref 6–20)
BUN: 17 mg/dL (ref 6–20)
CO2: 25 mmol/L (ref 22–32)
CO2: 26 mmol/L (ref 22–32)
Calcium: 8.6 mg/dL — ABNORMAL LOW (ref 8.9–10.3)
Calcium: 8.8 mg/dL — ABNORMAL LOW (ref 8.9–10.3)
Chloride: 105 mmol/L (ref 101–111)
Chloride: 103 mmol/L (ref 101–111)
Creatinine, Ser: 1.3 mg/dL — ABNORMAL HIGH (ref 0.61–1.24)
Creatinine, Ser: 1.36 mg/dL — ABNORMAL HIGH (ref 0.61–1.24)
GFR calc Af Amer: >60 mL/min (ref >60)
GFR calc Af Amer: >60 mL/min (ref >60)
GFR calc non Af Amer: >60 mL/min (ref >60)
GFR calc non Af Amer: 59 mL/min — ABNORMAL LOW (ref >60)
Glucose, Bld: 101 mg/dL — ABNORMAL HIGH (ref 65–99)
Glucose, Bld: 116 mg/dL — ABNORMAL HIGH (ref 65–99)
Potassium: 4.1 mmol/L (ref 3.5–5.1)
Potassium: 3.9 mmol/L (ref 3.5–5.1)
Sodium: 136 mmol/L (ref 135–145)
Sodium: 137 mmol/L (ref 135–145)
Total Bilirubin: 1 mg/dL (ref 0.3–1.2)
Total Bilirubin: 0.9 mg/dL (ref 0.3–1.2)
Total Protein: 7.1 g/dL (ref 6.5–8.1)
Total Protein: 6.9 g/dL (ref 6.5–8.1)

## 2015-04-27 LAB — MAGNESIUM: Magnesium: 2 mg/dL (ref 1.7–2.4)

## 2015-04-27 LAB — PROTIME-INR
INR: 1.11 (ref 0.00–1.49)
Prothrombin Time: 14.5 seconds (ref 11.6–15.2)

## 2015-04-27 LAB — APTT: aPTT: 29 seconds (ref 24–37)

## 2015-04-27 MED ORDER — CALCIUM CARBONATE ANTACID 500 MG PO CHEW: 1.0000 | CHEWABLE_TABLET | Freq: Two times a day (BID) | ORAL | Status: DC

## 2015-04-27 MED ORDER — TBO-FILGRASTIM 480 MCG/0.8ML ~~LOC~~ SOSY
480.0000 ug | PREFILLED_SYRINGE | Freq: Once | SUBCUTANEOUS | Status: DC
  Filled 2015-04-27: qty 0.8

## 2015-04-27 MED ORDER — CARBOXYMETHYLCELLULOSE SODIUM 0.5 % OP SOLN: 1.0000 [drp] | Freq: Three times a day (TID) | OPHTHALMIC | Status: DC | PRN

## 2015-04-27 MED ORDER — ONDANSETRON HCL 4 MG/2ML IJ SOLN: 4.0000 mg | Freq: Four times a day (QID) | INTRAMUSCULAR | Status: DC | PRN

## 2015-04-27 MED ORDER — PROCHLORPERAZINE MALEATE 10 MG PO TABS: 10.0000 mg | ORAL_TABLET | Freq: Four times a day (QID) | ORAL | Status: DC | PRN

## 2015-04-27 MED ORDER — ACETAMINOPHEN 650 MG RE SUPP: 650.0000 mg | Freq: Four times a day (QID) | RECTAL | Status: DC | PRN

## 2015-04-27 MED ORDER — VALACYCLOVIR HCL 500 MG PO TABS
1000.0000 mg | ORAL_TABLET | Freq: Every day | ORAL | Status: DC
  Administered 2015-04-28: 1000 mg via ORAL
  Filled 2015-04-27 (×2): qty 2

## 2015-04-27 MED ORDER — SODIUM CHLORIDE 0.9 % IV SOLN
INTRAVENOUS | Status: DC
  Administered 2015-04-27 – 2015-04-28 (×2): via INTRAVENOUS

## 2015-04-27 MED ORDER — ZOLPIDEM TARTRATE 10 MG PO TABS: 10.0000 mg | ORAL_TABLET | Freq: Every evening | ORAL | Status: DC | PRN

## 2015-04-27 MED ORDER — ACETAMINOPHEN 325 MG PO TABS: 650.0000 mg | ORAL_TABLET | Freq: Four times a day (QID) | ORAL | Status: DC | PRN

## 2015-04-27 MED ORDER — CLINDAMYCIN PHOSPHATE 600 MG/50ML IV SOLN
600.0000 mg | Freq: Once | INTRAVENOUS | Status: AC
  Administered 2015-04-27: 600 mg via INTRAVENOUS
  Filled 2015-04-27: qty 50

## 2015-04-27 MED ORDER — CIPROFLOXACIN HCL 500 MG PO TABS
500.0000 mg | ORAL_TABLET | Freq: Two times a day (BID) | ORAL | Status: DC
  Administered 2015-04-27 – 2015-04-28 (×2): 500 mg via ORAL
  Filled 2015-04-27 (×2): qty 1

## 2015-04-27 MED ORDER — CHLORHEXIDINE GLUCONATE 0.12 % MT SOLN
15.0000 mL | Freq: Two times a day (BID) | OROMUCOSAL | Status: DC
  Administered 2015-04-27 – 2015-04-28 (×3): 15 mL via OROMUCOSAL
  Filled 2015-04-27 (×3): qty 15

## 2015-04-27 MED ORDER — POLYMYXIN B-TRIMETHOPRIM 10000-0.1 UNIT/ML-% OP SOLN
1.0000 [drp] | OPHTHALMIC | Status: DC
  Administered 2015-04-27 – 2015-04-29 (×7): 1 [drp] via OPHTHALMIC
  Filled 2015-04-27: qty 10

## 2015-04-27 MED ORDER — CLINDAMYCIN PHOSPHATE 600 MG/50ML IV SOLN
600.0000 mg | Freq: Three times a day (TID) | INTRAVENOUS | Status: DC
  Administered 2015-04-27 – 2015-04-28 (×3): 600 mg via INTRAVENOUS
  Filled 2015-04-27 (×3): qty 50

## 2015-04-27 MED ORDER — ASPIRIN EC 325 MG PO TBEC
325.0000 mg | DELAYED_RELEASE_TABLET | Freq: Every day | ORAL | Status: DC
  Administered 2015-04-27 – 2015-04-28 (×2): 325 mg via ORAL
  Filled 2015-04-27 (×2): qty 1

## 2015-04-27 MED ORDER — PREDNISOLONE ACETATE 1 % OP SUSP
1.0000 [drp] | OPHTHALMIC | Status: DC
  Administered 2015-04-27 – 2015-04-29 (×11): 1 [drp] via OPHTHALMIC
  Filled 2015-04-27: qty 1

## 2015-04-27 MED ORDER — ONDANSETRON HCL 4 MG PO TABS: 4.0000 mg | ORAL_TABLET | Freq: Four times a day (QID) | ORAL | Status: DC | PRN

## 2015-04-27 MED ORDER — LACTULOSE 10 GM/15ML PO SOLN: 20.0000 g | ORAL | Status: DC | PRN

## 2015-04-27 MED ORDER — POLYVINYL ALCOHOL 1.4 % OP SOLN
1.0000 [drp] | Freq: Three times a day (TID) | OPHTHALMIC | Status: DC | PRN
  Administered 2015-04-27: 1 [drp] via OPHTHALMIC
  Filled 2015-04-27: qty 15

## 2015-04-27 MED ORDER — LORAZEPAM 0.5 MG PO TABS: 0.5000 mg | ORAL_TABLET | Freq: Four times a day (QID) | ORAL | Status: DC | PRN

## 2015-04-27 MED ORDER — FILGRASTIM 480 MCG/1.6ML IJ SOLN
960.0000 ug | Freq: Every day | INTRAMUSCULAR | Status: DC
  Administered 2015-04-28: 960 ug via SUBCUTANEOUS
  Filled 2015-04-27 (×3): qty 3.2

## 2015-04-27 NOTE — H&P (Signed)
[facility] History and Physical  Tyler Phillips FUX:[id number] DOB: [birth year] DOA: 04/27/2015  Referring physician: ER physician: Dr. Zenovia Jarred PCP: [name], MD  Chief Complaint: left eye swelling and redness  HPI:  50 year old male with past medical history of multiple myeloma (follows with Dr. [name]), on chemotherapy, CKD who presented to [facility] with worsening redness and swelling in left eye started about 5 days prior to this admission. His PCP gave him polymyxin eye drops with no significant symptomatic relief. No fevers or chills. No loss of vision to this eye although he has hard time opening the eye. No other complaints such as chest pain, shortness of breath, palpitations. No abdominal pain, nausea or vomiting, No diarrhea or const\ipation.   In ED, pt was hemodynamically stable. Blood work demonstrated WBC count 0.3, hemoglobin 11, platelets 53, creatinine 1.3. His left eye is red, swollen and he has trouble opening his eye. He was started on clindamycin and transferred from [facility] to [facility] for admission and further management of preseptal cellulitis.   Assessment & Plan    Principal Problem:   Preseptal cellulitis - Cellulitis order set placed, moderate non-purulent diease - Clindamycin started - Follow up blood culture results   Active Problems:   Multiple myeloma ([place]) - Managed by Dr. [name] of oncology, will inform him of pt admission  - Had velcade 04/22/2015    CKD (chronic kidney disease) stage 2, GFR 60-89 ml/min - Baseline Cr 1.4 - Cr on this admission within baseline values    Drug-induced neutropenia (HCC) - Due to chemotherapy - Start filgrastim    Chemotherapy-induced thrombocytopenia - Monitor daily CBC - No reports of bleeding     Antineoplastic chemotherapy induced anemia - Hemoglobin 11 - No current indications for transfusion   DVT prophylaxis:  - SCD's due to thrombocytopenia   Radiological Exams on Admission: No results  found.    Code Status: Full Family Communication: Plan of care discussed with the patient  Disposition Plan: Admit for further evaluation  Leisa Lenz, MD  [facility] Pager [phone]  Time spent in minutes: 55 minutes  Review of Systems:  Constitutional: Negative for malaise/fatigue. Negative for diaphoresis.  HENT: Negative for hearing loss, ear pain, nosebleeds, congestion, sore throat, neck pain, tinnitus and ear discharge.   Eyes: Negative for blurred vision, double vision, photophobia, pain, discharge and redness.  Respiratory: Negative for cough, hemoptysis, sputum production, shortness of breath, wheezing and stridor.   Cardiovascular: Negative for chest pain, palpitations, orthopnea, claudication and leg swelling.  Gastrointestinal: Negative for nausea, vomiting and abdominal pain. Negative for heartburn, constipation, blood in stool and melena.  Genitourinary: Negative for dysuria, urgency, frequency, hematuria and flank pain.  Musculoskeletal: Negative for myalgias, back pain, joint pain and falls.  Skin: per HPI Neurological: Negative for dizziness and weakness. Negative for tingling, tremors, sensory change, speech change, focal weakness, loss of consciousness and headaches.  Endo/Heme/Allergies: Negative for environmental allergies and polydipsia. Does not bruise/bleed easily.  Psychiatric/Behavioral: Negative for suicidal ideas. The patient is not nervous/anxious.      Past Medical History  Diagnosis Date  . Myeloma ([place])    History reviewed. No pertinent past surgical history. Social History:  reports that he has never smoked. He has never used smokeless tobacco. He reports that he drinks about 4.2 oz of alcohol per week. He reports that he does not use illicit drugs.  No Known Allergies  Family History: hypertension in family    Prior to Admission medications   Medication  Sig Start Date End Date Taking? Authorizing Provider  aspirin 325 MG EC tablet  Take 325 mg by mouth daily.    Historical Provider, MD  bortezomib IV (VELCADE) 3.5 MG injection Inject into the vein.    Historical Provider, MD  calcium carbonate (TUMS - DOSED IN MG ELEMENTAL CALCIUM) 500 MG chewable tablet Chew 1 tablet by mouth 2 (two) times daily.    Historical Provider, MD  dexamethasone (DECADRON) 4 MG tablet TAKE 5 TABLET AT ONE TIME WITH FOOD FOR 3 WEEKS THEN OFF ONE WEEK *INS ALLOWS 30 DAYS 11/06/14   [name], MD  famciclovir (FAMVIR) 500 MG tablet Take 1 tablet (500 mg total) by mouth daily. 11/06/14   [name], MD  Lactulose 20 GM/30ML SOLN Take 30 mLs (20 g total) by mouth 4 (four) times daily. Patient taking differently: Take 30 mLs by mouth as needed.  08/09/14   [name], MD  lenalidomide (REVLIMID) 25 MG capsule Take 1 capsule (25 mg total) by mouth daily. Take for 21 days and 7 days off. [id number] 03/13/15   [name], MD  LORazepam (ATIVAN) 0.5 MG tablet Take 1 tablet (0.5 mg total) by mouth every 6 (six) hours as needed (Nausea or vomiting). 11/06/14   [name], MD  prochlorperazine (COMPAZINE) 10 MG tablet Take 1 tablet (10 mg total) by mouth every 6 (six) hours as needed (Nausea or vomiting). 11/06/14   [name], MD  Vitamin D, Ergocalciferol, (DRISDOL) 50000 UNITS CAPS capsule Take by mouth. 03/28/15 04/27/15  Historical Provider, MD  zolendronic acid (ZOMETA) 4 MG/5ML injection Inject into the vein.    Historical Provider, MD  zolpidem (AMBIEN) 10 MG tablet Take 10 mg by mouth at bedtime as needed.  08/02/14   Historical Provider, MD   Physical Exam: Filed Vitals:   04/27/15 1230 04/27/15 1330 04/27/15 1549 04/27/15 1624  BP: 138/81 127/72  137/80  Pulse:  84  78  Temp:   99.9 F (37.7 C)   TempSrc:      Resp:      Height:      Weight:      SpO2:        Physical Exam  Constitutional: Appears well-developed and well-nourished. No distress.  HENT: Normocephalic. No tonsillar erythema or exudates Eyes: left eye  redness and swelling, right eye no redness Neck: Normal ROM. Neck supple. No JVD. No tracheal deviation. No thyromegaly.  CVS: RRR, S1/S2 +, no murmurs, no gallops, no carotid bruit.  Pulmonary: Effort and breath sounds normal, no stridor, rhonchi, wheezes, rales.  Abdominal: Soft. BS +,  no distension, tenderness, rebound or guarding.  Musculoskeletal: Normal range of motion. No edema and no tenderness.  Lymphadenopathy: No lymphadenopathy noted, cervical, inguinal. Neuro: Alert. Normal reflexes, muscle tone coordination. No focal neurologic deficits. Skin: Skin is warm and dry. No rash noted.  No erythema. No pallor.  Psychiatric: Normal mood and affect. Behavior, judgment, thought content normal.   Labs on Admission:  Basic Metabolic Panel:  Recent Labs Lab 04/22/15 1117 04/23/15 1205 04/25/15 1141 04/27/15 1105  NA 138 138 135* 136  K 4.0 4.3 4.3 4.1  CL 103  --   --  105  CO2 '26 24 23 25  ' GLUCOSE 85 90 84 101*  BUN 18 21.1 23.1 16  CREATININE 1.1 1.1 1.2 1.30*  CALCIUM 8.7 9.0 8.8 8.6*   Liver Function Tests:  Recent Labs Lab 04/22/15 1117 04/27/15 1105  AST 22  13*  ALT 33 22  ALKPHOS 41 52  BILITOT 1.00 1.0  PROT 7.0 7.1  ALBUMIN 3.4 4.0   No results for input(s): LIPASE, AMYLASE in the last 168 hours. No results for input(s): AMMONIA in the last 168 hours. CBC:  Recent Labs Lab 04/22/15 1116 04/23/15 1204 04/25/15 1140 04/27/15 1105  WBC 5.4 9.5 1.1* 0.3*  NEUTROABS 5.0 9.1* 0.8* 0.0*  HGB 12.4* 12.1* 12.2* 11.0*  HCT 35.8* 34.7* 34.9* 32.6*  MCV 95 94 93 93.1  PLT 108* 79* 56* 53*   Cardiac Enzymes: No results for input(s): CKTOTAL, CKMB, CKMBINDEX, TROPONINI in the last 168 hours. BNP: Invalid input(s): POCBNP CBG: No results for input(s): GLUCAP in the last 168 hours.  If 7PM-7AM, please contact night-coverage [email or web address] Password [facility] 04/27/2015, 5:42 PM

## 2015-04-27 NOTE — Progress Notes (Addendum)
Progress Notes  Received call from Dr. Zenovia Jarred, EDP at [facility] re admission to [facility]. 50 year old male with history of multiple myeloma, last chemotherapy & Neupogen approximately a week ago, awaiting bone marrow transplant at [facility], recently treated for left eye conjunctivitis with polymyxin drops and oral Cipro, presented to [facility] [place] ED with features consistent with preseptal cellulitis in immunocompromised patient. As per report, patient alert and oriented, in no distress, stable vital signs and physical exam. Lab work shows pancytopenia, severe neutropenia & thrombocytopenia. EDP does not see need for CT evaluation or ophthalmology consultation currently. Has received a dose of IV clindamycin. EDP discussed case with Oncologist on call Dr. Sonny Dandy who recommended inpatient treatment.  Patient accepted for admission to [facility], medical bed, inpatient status. Please consult oncology formally when patient arrives.  Vernell Leep, MD, FACP, FHM. [facility] Pager [phone]  If 7PM-7AM, please contact night-coverage [email or web address] Password [email or web address] 04/27/2015, 12:50 PM

## 2015-04-27 NOTE — ED Provider Notes (Signed)
CSN: [id number]     Arrival date & time 04/27/15  0957 History   First MD Initiated Contact with Patient 04/27/15 1001     Chief Complaint  Patient presents with  . Conjunctivitis     (Consider location/radiation/quality/duration/timing/severity/associated sxs/prior Treatment) HPI    Patient's 50 year old male with history of multiple myeloma, currently on nutrition shots, also received chemotherapy last Saturday. Patient is being set up for a bone marrow transplant next week. Patient 5 days ago started having redness to his eye. He was given polymyxin drops by his primary care physician. His eye and started to improve until yesterday when he started having increasing swelling. This morning he woke up and unable to open his eye. Now he has swelling to the anterior aspect of his left eye. Patient has also endorses mild fever prior to arrival. No nausea no vomiting or diarrhea.  Past Medical History  Diagnosis Date  . Myeloma ([place])    History reviewed. No pertinent past surgical history. No family history on file. Social History  Substance Use Topics  . Smoking status: Never Smoker   . Smokeless tobacco: Never Used     Comment: NEVER USED TOBACCO  . Alcohol Use: 4.2 oz/week    0 Standard drinks or equivalent, 7 Glasses of wine per week    Review of Systems  Constitutional: Positive for fever. Negative for activity change.  HENT: Negative for congestion.   Eyes: Positive for pain, discharge and redness.  Respiratory: Negative for shortness of breath.   Cardiovascular: Negative for chest pain.  Gastrointestinal: Negative for abdominal pain.  Genitourinary: Negative for dysuria.  Psychiatric/Behavioral: Negative for agitation.  All other systems reviewed and are negative.     Allergies  Review of patient's allergies indicates no known allergies.  Home Medications   Prior to Admission medications   Medication Sig Start Date End Date Taking? Authorizing Provider  aspirin  325 MG EC tablet Take 325 mg by mouth daily.    Historical Provider, MD  bortezomib IV (VELCADE) 3.5 MG injection Inject into the vein.    Historical Provider, MD  calcium carbonate (TUMS - DOSED IN MG ELEMENTAL CALCIUM) 500 MG chewable tablet Chew 1 tablet by mouth 2 (two) times daily.    Historical Provider, MD  dexamethasone (DECADRON) 4 MG tablet TAKE 5 TABLET AT ONE TIME WITH FOOD FOR 3 WEEKS THEN OFF ONE WEEK *INS ALLOWS 30 DAYS 11/06/14   [name], MD  famciclovir (FAMVIR) 500 MG tablet Take 1 tablet (500 mg total) by mouth daily. 11/06/14   [name], MD  Lactulose 20 GM/30ML SOLN Take 30 mLs (20 g total) by mouth 4 (four) times daily. Patient taking differently: Take 30 mLs by mouth as needed.  08/09/14   [name], MD  lenalidomide (REVLIMID) 25 MG capsule Take 1 capsule (25 mg total) by mouth daily. Take for 21 days and 7 days off. IWOE#[id number] 03/13/15   [name], MD  LORazepam (ATIVAN) 0.5 MG tablet Take 1 tablet (0.5 mg total) by mouth every 6 (six) hours as needed (Nausea or vomiting). 11/06/14   [name], MD  prochlorperazine (COMPAZINE) 10 MG tablet Take 1 tablet (10 mg total) by mouth every 6 (six) hours as needed (Nausea or vomiting). 11/06/14   [name], MD  Vitamin D, Ergocalciferol, (DRISDOL) 50000 UNITS CAPS capsule Take by mouth. 03/28/15 04/27/15  Historical Provider, MD  zolendronic acid (ZOMETA) 4 MG/5ML injection Inject into the vein.  Historical Provider, MD  zolpidem (AMBIEN) 10 MG tablet Take 10 mg by mouth at bedtime as needed.  08/02/14   Historical Provider, MD   BP 136/84 mmHg  Pulse 80  Temp(Src) 99.4 F (37.4 C) (Oral)  Resp 18  Ht _0  (1.854 m)  Wt 217 lb (98.431 kg)  BMI 28.64 kg/m2  SpO2 99% Physical Exam  Constitutional: He is oriented to person, place, and time. He appears well-nourished.  HENT:  Head: Normocephalic.  Right Ear: External ear normal.  Left Ear: External ear normal.  Mouth/Throat: Oropharynx is  clear and moist.  Eyes:  Left eye shows normal conjunctiva. Swelling to upper lid and lower lid. Significant swelling or edema. No pain with extraocular movements. Pupil equal responsive  Neck: No tracheal deviation present.  Cardiovascular: Normal rate.   Pulmonary/Chest: Effort normal. No stridor. No respiratory distress.  Hickman in right chest wall.  Abdominal: Soft. There is no tenderness. There is no guarding.  Musculoskeletal: Normal range of motion. He exhibits no edema.  Neurological: He is oriented to person, place, and time. No cranial nerve deficit.  Skin: Skin is warm and dry. No rash noted. He is not diaphoretic.  Psychiatric: He has a normal mood and affect. His behavior is normal.  Nursing note and vitals reviewed.   ED Course  Procedures (including critical care time) Labs Review Labs Reviewed  COMPREHENSIVE METABOLIC PANEL - Abnormal; Notable for the following:    Glucose, Bld 101 (*)    Creatinine, Ser 1.30 (*)    Calcium 8.6 (*)    AST 13 (*)    All other components within normal limits  CBC WITH DIFFERENTIAL/PLATELET - Abnormal; Notable for the following:    WBC 0.3 (*)    RBC 3.50 (*)    Hemoglobin 11.0 (*)    HCT 32.6 (*)    Platelets 53 (*)    Neutro Abs 0.0 (*)    Lymphs Abs 0.2 (*)    All other components within normal limits    Imaging Review No results found. I have personally reviewed and evaluated these images and lab results as part of my medical decision-making.   EKG Interpretation None      MDM   Final diagnoses:  None   patient is a 50 year old male immunocompromised on neuprogen and chemotherapy for multiple myeloma. Patient's presenting today with preseptal cellulitis. Patient was treated with polymyxin drops earlier this week for conjunctivitis. It appears to have progressed to a cellulitis. He has no pain with extraocular movements. Therefore do not think it is necessary to CAT scan to rule out orbital cellulitis. Patient only  has mild fever here. Eating drinking normally does not appear toxic. However, developed cellultiis while on cipro and drops.  However given patient's immunocompromise state I think we need to touch base with patient's primary oncologist to see whether IV antibiotics and admission is necessary given his severely immunocompromised state.  11:30 Discussed with Dr. Delorise Jackson, oncologist about patient. Recommend admission, IV abx as well.  12:15 PM Left a message with the adult bone marrow transplant center at [place]. [place] [phone] that patient will not be able to get transplant tomorrow.   Canio Winokur Julio Alm, MD 04/27/15 1217

## 2015-04-27 NOTE — ED Notes (Addendum)
Patient saw primary MD on Tuesday for L eye irritation and has been using antibiotics & states that eye started getting better but yesterday became inflamed again. Seeing Dr [name] for multiple myeloma. Left eye swollen/red

## 2015-04-27 NOTE — ED Notes (Signed)
[facility] called at 15:03

## 2015-04-28 ENCOUNTER — Inpatient Hospital Stay (HOSPITAL_COMMUNITY): Payer: BLUE CROSS/BLUE SHIELD

## 2015-04-28 DIAGNOSIS — C9000 Multiple myeloma not having achieved remission: Secondary | ICD-10-CM

## 2015-04-28 DIAGNOSIS — L03213 Periorbital cellulitis: Principal | ICD-10-CM

## 2015-04-28 DIAGNOSIS — D709 Neutropenia, unspecified: Secondary | ICD-10-CM

## 2015-04-28 LAB — CBC
HCT: 30.7 % — ABNORMAL LOW (ref 39.0–52.0)
Hemoglobin: 10.9 g/dL — ABNORMAL LOW (ref 13.0–17.0)
MCH: 33.1 pg (ref 26.0–34.0)
MCHC: 35.5 g/dL (ref 30.0–36.0)
MCV: 93.3 fL (ref 78.0–100.0)
Platelets: 65 10*3/uL — ABNORMAL LOW (ref 150–400)
RBC: 3.29 MIL/uL — ABNORMAL LOW (ref 4.22–5.81)
RDW: 13.4 % (ref 11.5–15.5)
WBC: 0.4 10*3/uL — CL (ref 4.0–10.5)

## 2015-04-28 LAB — BASIC METABOLIC PANEL
Anion gap: 7 (ref 5–15)
BUN: 16 mg/dL (ref 6–20)
CO2: 26 mmol/L (ref 22–32)
Calcium: 8.4 mg/dL — ABNORMAL LOW (ref 8.9–10.3)
Chloride: 104 mmol/L (ref 101–111)
Creatinine, Ser: 1.41 mg/dL — ABNORMAL HIGH (ref 0.61–1.24)
GFR calc Af Amer: >60 mL/min (ref >60)
GFR calc non Af Amer: 57 mL/min — ABNORMAL LOW (ref >60)
Glucose, Bld: 106 mg/dL — ABNORMAL HIGH (ref 65–99)
Potassium: 4.5 mmol/L (ref 3.5–5.1)
Sodium: 137 mmol/L (ref 135–145)

## 2015-04-28 LAB — GLUCOSE, CAPILLARY: Glucose-Capillary: 86 mg/dL (ref 65–99)

## 2015-04-28 LAB — HIV ANTIBODY (ROUTINE TESTING W REFLEX): HIV Screen 4th Generation wRfx: NONREACTIVE

## 2015-04-28 MED ORDER — ENOXAPARIN SODIUM 40 MG/0.4ML ~~LOC~~ SOLN
40.0000 mg | SUBCUTANEOUS | Status: DC
  Administered 2015-04-28: 40 mg via SUBCUTANEOUS
  Filled 2015-04-28: qty 0.4

## 2015-04-28 NOTE — Consult Note (Signed)
Referral MD  Reason for Referral: Left periorbital cellulitis with neutropenia secondary to pretransplant chemotherapy for myeloma   Chief Complaint  Patient presents with  . Conjunctivitis  : My eyes swelled up over the weekend.  HPI: [name] is well-known to me. He is a 50 year old white male. He presented back in March of last year with severe IgG Kappa myeloma. He had an incredible tumor burden. He responded very well to chemotherapy with Velcade/Revlimid/Decadron.  He is in the process of a autologous bone marrow transplant. He is getting this at [facility]. He recently underwent white blood cell priming with high-dose Cytoxan. Because of this, his white cell count is going down.  Over the weekend, he began have some swelling and pain with the left eye. He had no trauma to the eye. He had little of a fever. He had some drainage from the eye.  He had no mouth sores. He had no with adenopathy in the neck.  He went to the emergency room. Because of his neutropenia, it is felt that he needed admission for IV antibiotics.  He had blood cultures done. I have to assume that he had cultures done of ocular discharge.  He's been started on antibiotics with clindamycin. He was on Cipro and Famvir.   He is feeling a little better. He is on high-dose Neupogen and help get his white blood cell count up.  He's had no diarrhea. He's had no cough. He's had no rashes. He's had no leg swelling.   Overall, his performance status is ECOG  1.  History reviewed. No pertinent past medical history.:  History reviewed. No pertinent past surgical history.:   Current facility-administered medications:  .  0.9 %  sodium chloride infusion, , Intravenous, Continuous, Robbie Lis, MD, Last Rate: 50 mL/hr at 04/27/15 1834 .  acetaminophen (TYLENOL) tablet 650 mg, 650 mg, Oral, Q6H PRN **OR** acetaminophen (TYLENOL) suppository 650 mg, 650 mg, Rectal, Q6H PRN, Robbie Lis, MD .  aspirin EC tablet  325 mg, 325 mg, Oral, Daily, Robbie Lis, MD, 325 mg at 04/28/15 1014 .  chlorhexidine (PERIDEX) 0.12 % solution 15 mL, 15 mL, Mouth/Throat, BID, Robbie Lis, MD, 15 mL at 04/28/15 1014 .  filgrastim (NEUPOGEN) injection 960 mcg, 960 mcg, Subcutaneous, Daily, Robbie Lis, MD, 960 mcg at 04/28/15 1014 .  lactulose (CHRONULAC) 10 GM/15ML solution 20 g, 20 g, Oral, Q4H PRN, Robbie Lis, MD .  LORazepam (ATIVAN) tablet 0.5 mg, 0.5 mg, Oral, Q6H PRN, Robbie Lis, MD .  ondansetron [facility]) tablet 4 mg, 4 mg, Oral, Q6H PRN **OR** ondansetron (ZOFRAN) injection 4 mg, 4 mg, Intravenous, Q6H PRN, Robbie Lis, MD .  polyvinyl alcohol (LIQUIFILM TEARS) 1.4 % ophthalmic solution 1 drop, 1 drop, Both Eyes, TID PRN, Robbie Lis, MD, 1 drop at 04/27/15 2039 .  prednisoLONE acetate (PRED FORTE) 1 % ophthalmic suspension 1 drop, 1 drop, Left Eye, Q2H while awake, Robbie Lis, MD, 1 drop at 04/28/15 1334 .  prochlorperazine (COMPAZINE) tablet 10 mg, 10 mg, Oral, Q6H PRN, Robbie Lis, MD .  trimethoprim-polymyxin b (POLYTRIM) ophthalmic solution 1 drop, 1 drop, Both Eyes, Q3H while awake, Robbie Lis, MD, 1 drop at 04/28/15 1334 .  valACYclovir (VALTREX) tablet 1,000 mg, 1,000 mg, Oral, Daily, Robbie Lis, MD, 1,000 mg at 04/28/15 1014 .  zolpidem (AMBIEN) tablet 10 mg, 10 mg, Oral, QHS PRN, Robbie Lis, MD:  . aspirin  325 mg Oral Daily  . chlorhexidine  15 mL Mouth/Throat BID  . filgrastim (NEUPOGEN)  SQ  960 mcg Subcutaneous Daily  . prednisoLONE acetate  1 drop Left Eye Q2H while awake  . trimethoprim-polymyxin b  1 drop Both Eyes Q3H while awake  . valACYclovir  1,000 mg Oral Daily  :  No Known Allergies:  No family history on file.:  Social History   Social History  . Marital Status: Married    Spouse Name: N/A  . Number of Children: N/A  . Years of Education: N/A   Occupational History  . Not on file.   Social History Main Topics  . Smoking status: Never Smoker   .  Smokeless tobacco: Never Used     Comment: NEVER USED TOBACCO  . Alcohol Use: 4.2 oz/week    0 Standard drinks or equivalent, 7 Glasses of wine per week  . Drug Use: No  . Sexual Activity: Not on file   Other Topics Concern  . Not on file   Social History Narrative  :  Pertinent items are noted in HPI.  Exam: Patient Vitals for the past 24 hrs:  BP Temp Temp src Pulse Resp SpO2 Height Weight  04/28/15 0618 - - - - - - - 215 lb (97.523 kg)  04/28/15 0534 119/75 mmHg 98.1 F (36.7 C) Oral 83 16 100 % - -  04/27/15 2102 132/76 mmHg 99.1 F (37.3 C) Oral 94 16 100 % - -  04/27/15 1710 (!) 143/87 mmHg 99.8 F (37.7 C) Oral 87 18 99 % 6\' 1"  (1.854 m) 215 lb 14.4 oz (97.932 kg)  04/27/15 1624 137/80 mmHg - - 78 - - - -  04/27/15 1549 - 99.9 F (37.7 C) - - - - - -    as above    Recent Labs  04/27/15 1825 04/28/15 0333  WBC 0.3* 0.4*  HGB 11.3* 10.9*  HCT 32.8* 30.7*  PLT 57* 65*    Recent Labs  04/27/15 1825 04/28/15 0333  NA 137 137  K 3.9 4.5  CL 103 104  CO2 26 26  GLUCOSE 116* 106*  BUN 17 16  CREATININE 1.36* 1.41*  CALCIUM 8.8* 8.4*    Blood smear review:  None  Pathology: None     Assessment and Plan:  [name] is a 50 year old white male with IgG myeloma. He basically has been in remission. He is being set up for an autologous bone marrow transplant.  He now has left periorbital cellulitis.  Hopefully, cultures were taken from the left ocular discharge.  The fact that he is neutropenic certainly does not help things out.  He will continue on Neupogen.  I think that his antibiotics need to be adjusted and he needs broad spectrum antibiotics given his neutropenia which may go on for several days. I think that Primaxin would be appropriate for him.  I spoke to his transplant doctor at [facility]. They really want to collect his stem cells when his white cell count comes up. They will be watching him closely. When his white cell count starts to  increase, then we can have him go out to [facility] for collection.  I would like to hope that his blood cultures will be negative.  I am grateful that his myeloma has responded so well to treatment. As such, his immune system should be okay.  We will follow along. I appreciate the outstanding care that he is getting from all the staff on [address] mostly the hospitalist.  Frederich Cha 1:7-9

## 2015-04-28 NOTE — Progress Notes (Signed)
Patient ID: Tyler Phillips, male   DOB: 1966-03-21, 50 y.o.   MRN: [id number] [facility] PROGRESS NOTE  Tyler Phillips [id number] DOB: [birth year] DOA: 04/27/2015 PCP: [name], MD  Brief narrative:    50 year old male with past medical history of multiple myeloma (follows with Dr. [name]), on chemotherapy, CKD who presented to [facility] with worsening redness and swelling in left eye started about 5 days prior to this admission. His PCP gave him polymyxin eye drops with no significant symptomatic relief. No fevers or chills. No loss of vision to this eye although he has hard time opening the eye.   In ED, pt was hemodynamically stable. Blood work demonstrated WBC count 0.3, hemoglobin 11, platelets 53, creatinine 1.3. His left eye is red, swollen and he has trouble opening his eye. He was started on clindamycin and transferred from [facility] to [facility] for admission and further management of preseptal cellulitis.    Assessment/Plan:    Principal Problem:  Preseptal cellulitis,left eye - Continue clindamycin and prednisolone eyedrops - Cellulitis seems to be better however swelling and redness is still present to quite extensive will order CT for further evaluation - Follow up blood culture results   Active Problems:  Multiple myeloma ([place]) - Managed by Dr. [name] of oncology, will inform him of pt admission  - Had velcade 04/22/2015   CKD (chronic kidney disease) stage 2, GFR 60-89 ml/min - Baseline Cr 1.4 - Creatinine is within baseline values   Drug-induced neutropenia (HCC) - Due to chemotherapy - Continue filgrastim   Chemotherapy-induced thrombocytopenia - Platelet count improving, 65 this morning - No reports of bleeding   Antineoplastic chemotherapy induced anemia - Hemoglobin 11, 10.9 - No reports of bleeding - No current indications for transfusion  DVT prophylaxis:  - Continue SCDs because of thrombocytopenia   Code Status: Full.  Family Communication:   plan of care discussed with the patient Disposition Plan: Home once cellulitis improves  IV access:  Peripheral IV  Procedures and diagnostic studies:    No results found.  Medical Consultants:  None   Other Consultants:  None   IAnti-Infectives:   Clindamycin 04/28/2015 -->   Leisa Lenz, MD  [facility] Pager [phone]  Time spent in minutes: 25 minutes  If 7PM-7AM, please contact night-coverage [email or web address] Password [email or web address] 04/28/2015, 9:18 AM   LOS: 1 day    HPI/Subjective: No acute overnight events. Patient reports he is able too pen his eye slightly.   Objective: Filed Vitals:   04/27/15 1710 04/27/15 2102 04/28/15 0534 04/28/15 0618  BP: 143/87 132/76 119/75   Pulse: 87 94 83   Temp: 99.8 F (37.7 C) 99.1 F (37.3 C) 98.1 F (36.7 C)   TempSrc: Oral Oral Oral   Resp: '18 16 16   ' Height: '6\' 1"'  (1.854 m)     Weight: 215 lb 14.4 oz (97.932 kg)   215 lb (97.523 kg)  SpO2: 99% 100% 100%     Intake/Output Summary (Last 24 hours) at 04/28/15 2257 Last data filed at 04/28/15 0550  Gross per 24 hour  Intake 903.34 ml  Output      0 ml  Net 903.34 ml    Exam:   General:  Pt is alert, follows commands appropriately, not in acute distress; left eye seems to be little better but swellingad rednessstill present   Cardiovascular: Regular rate and rhythm, S1/S2, no murmurs  Respiratory: Clear to auscultation bilaterally, no wheezing, no crackles, no rhonchi  Abdomen: Soft, non tender, non distended,  bowel sounds present  Extremities: No edema, pulses DP and PT palpable bilaterally  Neuro: Grossly nonfocal  Data Reviewed: Basic Metabolic Panel:  Recent Labs Lab 04/22/15 1117 04/23/15 1205 04/25/15 1141 04/27/15 1105 04/27/15 1825 04/28/15 0333  NA 138 138 135* 136 137 137  K 4.0 4.3 4.3 4.1 3.9 4.5  CL 103  --   --  105 103 104  CO2 '26 24 23 25 26 26  ' GLUCOSE 85 90 84 101* 116* 106*  BUN 18 21.1 23.'1 16 17 16  ' CREATININE 1.1 1.1 1.2  1.30* 1.36* 1.41*  CALCIUM 8.7 9.0 8.8 8.6* 8.8* 8.4*  MG  --   --   --   --  2.0  --   PHOS  --   --   --   --  2.8  --    Liver Function Tests:  Recent Labs Lab 04/22/15 1117 04/27/15 1105 04/27/15 1825  AST 22 13* 10*  ALT 33 22 21  ALKPHOS 41 52 50  BILITOT 1.00 1.0 0.9  PROT 7.0 7.1 6.9  ALBUMIN 3.4 4.0 4.0   No results for input(s): LIPASE, AMYLASE in the last 168 hours. No results for input(s): AMMONIA in the last 168 hours. CBC:  Recent Labs Lab 04/22/15 1116 04/23/15 1204 04/25/15 1140 04/27/15 1105 04/27/15 1825 04/28/15 0333  WBC 5.4 9.5 1.1* 0.3* 0.3* 0.4*  NEUTROABS 5.0 9.1* 0.8* 0.0* 0.0*  --   HGB 12.4* 12.1* 12.2* 11.0* 11.3* 10.9*  HCT 35.8* 34.7* 34.9* 32.6* 32.8* 30.7*  MCV 95 94 93 93.1 93.7 93.3  PLT 108* 79* 56* 53* 57* 65*   Cardiac Enzymes: No results for input(s): CKTOTAL, CKMB, CKMBINDEX, TROPONINI in the last 168 hours. BNP: Invalid input(s): POCBNP CBG:  Recent Labs Lab 04/28/15 0748  GLUCAP 86    No results found for this or any previous visit (from the past 240 hour(s)).   Scheduled Meds: . aspirin  325 mg Oral Daily  . chlorhexidine  15 mL Mouth/Throat BID  . ciprofloxacin  500 mg Oral BID  . clindamycin (CLEOCIN) IV  600 mg Intravenous Q8H  . filgrastim (NEUPOGEN)  SQ  960 mcg Subcutaneous Daily  . prednisoLONE acetate  1 drop Left Eye Q2H while awake  . trimethoprim-polymyxin b  1 drop Both Eyes Q3H while awake  . valACYclovir  1,000 mg Oral Daily   Continuous Infusions: . sodium chloride 50 mL/hr at 04/27/15 1834

## 2015-04-28 NOTE — Progress Notes (Signed)
CRITICAL VALUE ALERT  Critical value received: WBC 0.3  Date of notification:  04/27/15  Time of notification: 1945  Critical value read back:Yes  Nurse who received alert: Azzie Glatter, RN  MD notified (1st page):  N/A (Lab values consistent with previous lab values, MD aware)  Time of first page:  N/A  MD notified (2nd page):N/A  Time of second page:N/A  Responding MD: N/A  Time MD responded:  N/A

## 2015-04-29 LAB — CBC WITH DIFFERENTIAL/PLATELET
Basophils Absolute: 0 10*3/uL (ref 0.0–0.1)
Basophils Relative: 0 %
Eosinophils Absolute: 0 10*3/uL (ref 0.0–0.7)
Eosinophils Relative: 1 %
HCT: 31.7 % — ABNORMAL LOW (ref 39.0–52.0)
Hemoglobin: 10.7 g/dL — ABNORMAL LOW (ref 13.0–17.0)
Lymphocytes Relative: 37 %
Lymphs Abs: 0.4 10*3/uL — ABNORMAL LOW (ref 0.7–4.0)
MCH: 32.4 pg (ref 26.0–34.0)
MCHC: 33.8 g/dL (ref 30.0–36.0)
MCV: 96.1 fL (ref 78.0–100.0)
Monocytes Absolute: 0.3 10*3/uL (ref 0.1–1.0)
Monocytes Relative: 26 %
Neutro Abs: 0.4 10*3/uL — ABNORMAL LOW (ref 1.7–7.7)
Neutrophils Relative %: 36 %
Platelets: 98 10*3/uL — ABNORMAL LOW (ref 150–400)
RBC: 3.3 MIL/uL — ABNORMAL LOW (ref 4.22–5.81)
RDW: 13.7 % (ref 11.5–15.5)
WBC: 1.1 10*3/uL — CL (ref 4.0–10.5)

## 2015-04-29 LAB — COMPREHENSIVE METABOLIC PANEL
ALT: 19 U/L (ref 17–63)
AST: 10 U/L — ABNORMAL LOW (ref 15–41)
Albumin: 3.7 g/dL (ref 3.5–5.0)
Alkaline Phosphatase: 42 U/L (ref 38–126)
Anion gap: 5 (ref 5–15)
BUN: 16 mg/dL (ref 6–20)
CO2: 28 mmol/L (ref 22–32)
Calcium: 8.3 mg/dL — ABNORMAL LOW (ref 8.9–10.3)
Chloride: 107 mmol/L (ref 101–111)
Creatinine, Ser: 1.34 mg/dL — ABNORMAL HIGH (ref 0.61–1.24)
GFR calc Af Amer: >60 mL/min (ref >60)
GFR calc non Af Amer: >60 mL/min (ref >60)
Glucose, Bld: 95 mg/dL (ref 65–99)
Potassium: 4.4 mmol/L (ref 3.5–5.1)
Sodium: 140 mmol/L (ref 135–145)
Total Bilirubin: 0.9 mg/dL (ref 0.3–1.2)
Total Protein: 6.6 g/dL (ref 6.5–8.1)

## 2015-04-29 MED ORDER — PREDNISOLONE ACETATE 1 % OP SUSP: 1.0000 [drp] | OPHTHALMIC | Status: DC

## 2015-04-29 MED ORDER — POLYVINYL ALCOHOL 1.4 % OP SOLN: 1.0000 [drp] | Freq: Three times a day (TID) | OPHTHALMIC | Status: DC | PRN

## 2015-04-29 NOTE — Discharge Summary (Signed)
Tyler Phillips, TIPPENS NO.:  [id number]  MEDICAL RECORD NO.:  [id number]  LOCATION:  [id number]                         FACILITY:  [facility]  PHYSICIAN:  [name], M.D.  DATE OF BIRTH:  1966/02/12  DATE OF ADMISSION:  04/27/2015 DATE OF DISCHARGE:  04/29/2015                              DISCHARGE SUMMARY   DIAGNOSES ON DISCHARGE: 1. Left orbital preseptal cellulitis. 2. Neutropenia secondary to chemotherapy for myeloma. 3. IgG kappa myeloma-very good partial remission. 4. Autologous stem cell transplantation workup.  CONDITION ON DISCHARGE:  Stable.  ACTIVITIES:  As tolerated.  DIET:  Without restrictions.  FOLLOWUP: 1. The patient will go to [facility] this morning     for stem cell collection. 2. The patient may need to come to the [facility]     for outpatient antibiotics.  DISCHARGE MEDICATIONS:  His medications upon discharge are; 1. Prednisolone ophthalmic suspension 1% one drop left eye every 2     hours. 2. Polytrim ophthalmic solution 1 drop both eyes every 3 hours while     awake. 3. Peridex mouth rinse 15 mL b.i.d. 4. Ativan 0.5 mg p.o. q.6 hours p.r.n. nausea and vomiting. 5. Compazine 10 mg p.o. q.6 hours p.r.n. nausea or vomiting. 6. Ambien 10 mg p.o. at bedtime p.r.n. 7. Lactulose 2 tablespoons q.i.d. p.r.n.  HOSPITAL COURSE:  The patient is a 50 year old white male.  He has IgG kappa myeloma.  He completed chemotherapy with Velcade, Revlimid, and Decadron about a month ago.  He is in the process of autologous stem cell transplantation at [facility].  He received high-dose Cytoxan last week in anticipation of collecting stem cells.  He subsequently became neutropenic.  Over the weekend, began to have some pain and swelling associated with the left eye.  He has some discharge from the left eye.  He went to the emergency room.  He was neutropenic.  He was admitted because of the concern for neutropenic  with cellulitis.  He was started on IV antibiotics.  He was started on Cleocin.  I saw him yesterday and started him on Primaxin.  I thought Primaxin would be a broader coverage given his neutropenia.  He was already taking Neupogen 960 mcg subcu daily to help collect his stem cells.  When he was admitted, his white cell count was 0.3, hemoglobin 11.3, and platelet count 57,000.  Today, his white cell count was up to 1.1.  Hemoglobin 10.7, platelet count 98,000.  His BUN is 16, creatinine 1.34.  His total protein is 6.6 with albumin of 3.7.  Potassium 4.4 and sodium is 140.  He did have CT scan of the orbit done yesterday.  This showed some edema and thickening around the left orbit.  This was preseptal.  The globe, extraocular muscle, and nerve complexes all looked okay.  The swelling improved nicely.  He remained afebrile.  Cultures have come back negative so far.  I spoke to his transplant doctor at [facility] yesterday.  They really would like to get him collected as he has already received the chemotherapy with high-dose Cytoxan.  Given that his white cell count has jumped up to  1.1 today, his bone marrow stem cells are now being produced.  As such, we are discharging him to go out to [facility] for stem cell collection.  He feels well.  He is eating well.  He is ambulating.  He is not having diarrhea.  His vision is doing better.  He does not have as much discharge.  He is using eye drops.  He was using both prednisolone eye drops and Polytrim eyedrops for the left eye.  PHYSICAL EXAMINATION:  VITAL SIGNS:  Upon discharge, his vital signs showed temperature of 98.5, pulse 73, blood pressure 104/56,  and oxygen saturation on room air was 98%. HEAD AND NECK:  Some moderate swelling of the left eye.  This is mostly swelling of the upper eyelid.  His pupils react appropriately.  He has good extraocular muscle movement.  __________ motion.  He has no oral lesions.  There is no  adenopathy in the neck. LUNGS:  Clear bilaterally. CARDIAC:  Regular rate and rhythm with a normal S1 and S2.  There are no murmurs, rubs, or bruits. ABDOMEN:  Soft.  He has good bowel sounds.  There is no fluid wave. There is no palpable liver or spleen tip. EXTREMITIES:  Show no clubbing, cyanosis, or edema. NEUROLOGICAL:  Shows no focal neurological deficits. SKIN:  No rashes, ecchymosis, or petechia.     [name], M.D.     PRE/MEDQ  D:  04/29/2015  T:  04/29/2015  Job:  [id number]

## 2015-04-29 NOTE — Discharge Instructions (Signed)
Let us know if [facility] wants [place] to give any antibiotics as an outpatient for the left eye infection

## 2015-04-29 NOTE — Progress Notes (Signed)
Dr Jonette Eva came by this morning and discharged Mr [name].  He was given discharge instructions and his two eye drops and RN Al discharged him. Lorrene Reid

## 2015-04-29 NOTE — Discharge Summary (Signed)
#   [id number] is discharge summary.  Pete E.  Rodman Key 7:7

## 2015-05-02 DIAGNOSIS — Z006 Encounter for examination for normal comparison and control in clinical research program: Secondary | ICD-10-CM | POA: Insufficient documentation

## 2015-05-03 LAB — CULTURE, BLOOD (ROUTINE X 2)
Culture: NO GROWTH
Culture: NO GROWTH
Culture: NO GROWTH
Culture: NO GROWTH

## 2015-05-06 DIAGNOSIS — Z8619 Personal history of other infectious and parasitic diseases: Secondary | ICD-10-CM | POA: Insufficient documentation

## 2015-05-06 DIAGNOSIS — Z9481 Bone marrow transplant status: Secondary | ICD-10-CM | POA: Insufficient documentation

## 2015-05-13 ENCOUNTER — Ambulatory Visit (HOSPITAL_BASED_OUTPATIENT_CLINIC_OR_DEPARTMENT_OTHER): Payer: BLUE CROSS/BLUE SHIELD

## 2015-05-13 VITALS — BP 126/80 | HR 77 | Temp 98.2°F | Resp 18

## 2015-05-13 DIAGNOSIS — Z452 Encounter for adjustment and management of vascular access device: Secondary | ICD-10-CM | POA: Diagnosis not present

## 2015-05-13 DIAGNOSIS — C9000 Multiple myeloma not having achieved remission: Secondary | ICD-10-CM

## 2015-05-13 MED ORDER — SODIUM CHLORIDE 0.9% FLUSH
10.0000 mL | INTRAVENOUS | Status: DC | PRN
  Administered 2015-05-13: 10 mL via INTRAVENOUS
  Filled 2015-05-13: qty 10

## 2015-05-13 MED ORDER — HEPARIN SOD (PORK) LOCK FLUSH 100 UNIT/ML IV SOLN
500.0000 [IU] | Freq: Once | INTRAVENOUS | Status: AC
  Administered 2015-05-13: 500 [IU] via INTRAVENOUS
  Filled 2015-05-13: qty 5

## 2015-05-13 NOTE — Patient Instructions (Signed)
Tunneled Catheter Insertion °Catheters are thin, flexible tubes that are inserted into a vein to provide access to the bloodstream. A tunneled catheter is used when a person's bloodstream needs to be accessed many times over a long period, usually longer than 30 days. The catheter provides a painless method of drawing blood, giving blood products, removing waste products from the blood (hemodialysis), and giving medicines. Tunneled catheters can be placed in different parts of the body depending on how they will be used. These catheters are secure and easy to access. A part of the catheter is tunneled under the skin. This is done to decrease the risk of infection.  °There are various types of tunneled catheters. The specific one used will depend on your needs. The catheter can be used right after insertion. °LET YOUR HEALTH CARE PROVIDER KNOW ABOUT:  °· Any allergies you have. °· All medicines you are taking, including vitamins, herbs, eyedrops, and over-the-counter medicines and creams.   °· Previous problems you or members of your family have had with the use of anesthetics.   °· Any blood disorders you have had. °· Possibility of pregnancy, if this applies.   °· Other health problems you have. Also, let your health care provider know if you have a pacemaker. °RISKS AND COMPLICATIONS °Generally, tunneled catheter insertion is a safe procedure. However, as with any surgical procedure, complications can occur. Possible complications include: °· Damage to the blood vessel.   °· Bruising or bleeding at the site of puncture.   °· Introduction of the catheter into an artery instead of a vein.   °· Skin infection at the site of catheter insertion.   °· Bloodstream infection, especially if your white blood cell count is low.   °· Developing a kink in the catheter so it does not work properly.   °· Developing a hole or crack in the catheter.   °· Blockage of the catheter.   °· Getting air in the catheter. °· Blood clots  around the catheter or in the vein near the catheter. °· Disturbance in the normal heart rhythm (rare). This is usually temporary.   °· A collapsed lung during insertion (rare).   °BEFORE THE PROCEDURE  °· You may need to have blood tests done before the day of the procedure.   °· Do not eat or drink anything for at least 8 hours before the procedure or as directed by your health care provider.   °· Ask your health care provider about changing or stopping your regular medicines. °· Avoid wearing jewelry the day of the procedure.   °· Make plans to have someone drive you home after the procedure. You should not drive immediately after the procedure.   °PROCEDURE °· You will be asked to lie on your back. °· A regular intravenous (IV) access tube may be put into a vein in your hand or arm. During the procedure, medicine can flow directly into your body through the IV tube. °· Small monitors will be put on your body. They are used to check your heart, blood pressure, and oxygen level. °· The catheter site is usually shaved, cleaned, and covered with a sterile drape. °· You will be given medicine to numb the area where the catheter will be placed (local anesthetic). You may also be given a medicine to help you relax (sedative). °· The health care provider will then make a small incision in the skin, usually in the lower neck. Another small incision is made a little lower, usually on the shoulder or upper chest. Ultrasonography may be used so that the health   care provider can see the vein and can properly guide the catheter placement. °· X-ray equipment may also be used to help ensure that the catheter is inserted safely and is placed where it will function most effectively. This equipment allows the health care provider to watch the catheter on a live display while guiding it into place. °· Generally, a small guidewire is put into the vein first. A tunnel is created under the skin. The health care provider guides the  movement of the guidewire into a larger vein closer to the heart. °· The catheter is pulled through the tunnel and then moved into the larger vein. The cuff on the catheter is located in the tunnel part. The cuff helps to anchor the catheter in place over time. Stitches are used to keep the catheter in place when first put in. °· An X-ray may be done to make sure the catheter is in the right place. °AFTER THE PROCEDURE  °· You may stay in a recovery area until the sedation has worn off. °· Your heart rate, blood pressure and oxygen level will be monitored. °· You may have some pain and swelling in the neck or shoulder. You will likely be given medicine to control this. °· If this was done as an outpatient procedure, you may be able to go home the same day. °  °This information is not intended to replace advice given to you by your health care provider. Make sure you discuss any questions you have with your health care provider. °  °Document Released: 04/25/2007 Document Revised: 04/26/2014 Document Reviewed: 02/16/2012 °Elsevier Interactive Patient Education ©2016 [employer] ° °

## 2015-05-14 ENCOUNTER — Telehealth: Payer: Self-pay | Admitting: Hematology & Oncology

## 2015-05-14 NOTE — Telephone Encounter (Signed)
Faxed medical records to :  [facility] DDS [place]: [id number] F: [phone]   Case: [id number]  Req: 01/05/2015 to present       COPY SCANNED

## 2015-05-30 DIAGNOSIS — T451X5A Adverse effect of antineoplastic and immunosuppressive drugs, initial encounter: Secondary | ICD-10-CM | POA: Insufficient documentation

## 2015-05-30 DIAGNOSIS — D61818 Other pancytopenia: Secondary | ICD-10-CM | POA: Insufficient documentation

## 2015-05-30 DIAGNOSIS — D61810 Antineoplastic chemotherapy induced pancytopenia: Secondary | ICD-10-CM | POA: Insufficient documentation

## 2015-06-01 DIAGNOSIS — A0472 Enterocolitis due to Clostridium difficile, not specified as recurrent: Secondary | ICD-10-CM | POA: Insufficient documentation

## 2015-06-17 ENCOUNTER — Other Ambulatory Visit: Payer: Self-pay | Admitting: *Deleted

## 2015-06-17 DIAGNOSIS — C9001 Multiple myeloma in remission: Secondary | ICD-10-CM

## 2015-06-18 ENCOUNTER — Ambulatory Visit (HOSPITAL_BASED_OUTPATIENT_CLINIC_OR_DEPARTMENT_OTHER): Payer: BLUE CROSS/BLUE SHIELD | Admitting: Hematology & Oncology

## 2015-06-18 ENCOUNTER — Ambulatory Visit: Payer: BLUE CROSS/BLUE SHIELD | Admitting: Hematology & Oncology

## 2015-06-18 ENCOUNTER — Other Ambulatory Visit: Payer: BLUE CROSS/BLUE SHIELD

## 2015-06-18 ENCOUNTER — Encounter: Payer: Self-pay | Admitting: Hematology & Oncology

## 2015-06-18 ENCOUNTER — Other Ambulatory Visit (HOSPITAL_BASED_OUTPATIENT_CLINIC_OR_DEPARTMENT_OTHER): Payer: BLUE CROSS/BLUE SHIELD

## 2015-06-18 VITALS — BP 98/64 | HR 92 | Temp 98.4°F | Resp 16 | Ht 73.0 in | Wt 204.0 lb

## 2015-06-18 DIAGNOSIS — C9001 Multiple myeloma in remission: Secondary | ICD-10-CM

## 2015-06-18 DIAGNOSIS — Z9484 Stem cells transplant status: Secondary | ICD-10-CM | POA: Diagnosis not present

## 2015-06-18 LAB — CBC WITH DIFFERENTIAL (CANCER CENTER ONLY)
BASO#: 0 10*3/uL (ref 0.0–0.2)
BASO%: 0.7 % (ref 0.0–2.0)
EOS%: 3.2 % (ref 0.0–7.0)
Eosinophils Absolute: 0.1 10*3/uL (ref 0.0–0.5)
HCT: 31.4 % — ABNORMAL LOW (ref 38.7–49.9)
HGB: 11 g/dL — ABNORMAL LOW (ref 13.0–17.1)
LYMPH#: 1.4 10*3/uL (ref 0.9–3.3)
LYMPH%: 31.9 % (ref 14.0–48.0)
MCH: 34.6 pg — ABNORMAL HIGH (ref 28.0–33.4)
MCHC: 35 g/dL (ref 32.0–35.9)
MCV: 99 fL — ABNORMAL HIGH (ref 82–98)
MONO#: 1.1 10*3/uL — ABNORMAL HIGH (ref 0.1–0.9)
MONO%: 26 % — ABNORMAL HIGH (ref 0.0–13.0)
NEUT#: 1.7 10*3/uL (ref 1.5–6.5)
NEUT%: 38.2 % — ABNORMAL LOW (ref 40.0–80.0)
Platelets: 133 10*3/uL — ABNORMAL LOW (ref 145–400)
RBC: 3.18 10*6/uL — ABNORMAL LOW (ref 4.20–5.70)
RDW: 16 % — ABNORMAL HIGH (ref 11.1–15.7)
WBC: 4.4 10*3/uL (ref 4.0–10.0)

## 2015-06-18 LAB — COMPREHENSIVE METABOLIC PANEL
ALT: 27 U/L (ref 0–55)
AST: 30 U/L (ref 5–34)
Albumin: 3.4 g/dL — ABNORMAL LOW (ref 3.5–5.0)
Alkaline Phosphatase: 47 U/L (ref 40–150)
Anion Gap: 7 mEq/L (ref 3–11)
BUN: 13.7 mg/dL (ref 7.0–26.0)
CO2: 23 mEq/L (ref 22–29)
Calcium: 8.6 mg/dL (ref 8.4–10.4)
Chloride: 107 mEq/L (ref 98–109)
Creatinine: 1.3 mg/dL (ref 0.7–1.3)
EGFR: 64 mL/min/{1.73_m2} — ABNORMAL LOW (ref >90)
Glucose: 109 mg/dl (ref 70–140)
Potassium: 4.4 mEq/L (ref 3.5–5.1)
Sodium: 137 mEq/L (ref 136–145)
Total Bilirubin: <0.3 mg/dL (ref 0.20–1.20)
Total Protein: 6.3 g/dL — ABNORMAL LOW (ref 6.4–8.3)

## 2015-06-19 NOTE — Progress Notes (Signed)
Hematology and Oncology Follow Up Visit  Tyler Phillips [id number] 09/09/1965 50 y.o. 06/19/2015   Principle Diagnosis:   IgG Kappa myeloma  Current Therapy:  Status post autologous stem cell transplant on 05/22/2015   S/p Cycle #6 of RVD  Zometa 4 mg IV every month-discontinued secondary to possible osteonecrosis     Interim History:  Tyler Phillips is back for follow-up. He now is back from [place]. He was down at [facility] for his otologist stem cell transplant. He underwent the transplant on February 2.  Prior to the transplant, he did have some issues. He had a left orbital cellulitis. Not sure how he developed this. He also developed herpes zoster of the right T10-11 dermatome. He was treated with Valtrex for this.  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 He is being seen by dental medicine for this.   He actually looks fairly good. He really tolerated the transplant fairly nicely. He does have a lot of fatigue which is totally to be expected.  He has had no fever. He has had no bleeding. He's had no cough. He's had no diarrhea. Has had some nausea but no vomiting. He's had no leg swelling. He's had no rashes. Again he had the right T10-11 zoster rash. This seems to have healed nicely.   I think he goes back to [facility] in another couple weeks.  As far as maintenance therapy is concerned, we do not have my anything yet. I would not think that he would be on anything for least another couple months.  As always, he is trying to exercise. He wants to try to outside more. He wants to try to do more work on his land. I told him that he has to take it easy. He may take another 3 or 4 months before he finally feels able to really do any activities.   Currently, his performance status is ECOG 1..   Medications:  Current outpatient prescriptions:  .  ondansetron (ZOFRAN) 8 MG tablet, Take 8 mg by mouth every 8 (eight) hours as needed., Disp: , Rfl:  .  oxyCODONE (OXY  IR/ROXICODONE) 5 MG immediate release tablet, Take 5 mg by mouth every 4 (four) hours as needed., Disp: , Rfl:  .  valACYclovir (VALTREX) 500 MG tablet, Take 250 mg by mouth 2 (two) times daily., Disp: , Rfl:  .  carboxymethylcellulose (REFRESH PLUS) 0.5 % SOLN, Place 1 drop into both eyes 3 (three) times daily as needed (dry eyes)., Disp: , Rfl:  .  chlorhexidine (PERIDEX) 0.12 % solution, Use as directed 15 mLs in the mouth or throat 2 (two) times daily. , Disp: , Rfl: 0 .  Lactulose 20 GM/30ML SOLN, Take 30 mLs (20 g total) by mouth 4 (four) times daily. (Patient taking differently: Take 30 mLs by mouth every 4 (four) hours as needed (constipation). ), Disp: 1000 mL, Rfl: 5 .  LORazepam (ATIVAN) 0.5 MG tablet, Take 1 tablet (0.5 mg total) by mouth every 6 (six) hours as needed (Nausea or vomiting)., Disp: 60 tablet, Rfl: 0 .  polyvinyl alcohol (LIQUIFILM TEARS) 1.4 % ophthalmic solution, Place 1 drop into both eyes 3 (three) times daily as needed for dry eyes., Disp: 15 mL, Rfl: 0 .  prednisoLONE acetate (PRED FORTE) 1 % ophthalmic suspension, Place 1 drop into the left eye every 2 (two) hours while awake., Disp: 5 mL, Rfl: 0 .  prochlorperazine (COMPAZINE) 10 MG tablet, Take 1 tablet (10 mg total) by  mouth every 6 (six) hours as needed (Nausea or vomiting)., Disp: 30 tablet, Rfl: 6 .  trimethoprim-polymyxin b (POLYTRIM) ophthalmic solution, Place 1 drop into both eyes every 3 (three) hours while awake., Disp: , Rfl:  .  Vitamin D, Ergocalciferol, (DRISDOL) 50000 units CAPS capsule, Take 50,000 Units by mouth once a week., Disp: , Rfl:  .  zolpidem (AMBIEN) 10 MG tablet, Take 10 mg by mouth at bedtime as needed for sleep. , Disp: , Rfl: 0  Allergies: No Known Allergies  Past Medical History, Surgical history, Social history, and Family History were reviewed and updated.  Review of Systems: As above  Physical Exam:  height is 6\' 1"  (1.854 m) and weight is 204 lb (92.534 kg). His oral  temperature is 98.4 F (36.9 C). His blood pressure is 98/64 and his pulse is 92. His respiration is 16.   Wt Readings from Last 3 Encounters:  06/18/15 204 lb (92.534 kg)  04/29/15 215 lb 1.6 oz (97.569 kg)  03/28/15 216 lb (97.977 kg)     Well-developed and well-nourished white male in no obvious distress. He has alopecia. Head and neck exam shows no ocular or oral lesions. There is some exposed mandibular bone in the left inner mandibular table. There are no palpable cervical or supraclavicular lymph nodes. Lungs are clear. Cardiac exam regular rate and rhythm with no murmurs, rubs or bruits. Abdomen is soft. He has good bowel sounds. There is no fluid wave. There is no palpable hepatomegaly. His spleen tip is not palpable. Back exam shows no tenderness over the spine, ribs or hips. Extremities shows no clubbing, cyanosis or edema. He has good range of motion of his joints. He has good strength in his extremities. Has good pulses in his distal extremities.Marland Kitchen He may have some trace edema in his lower legs. Skin exam shows no rashes, ecchymoses or petechia. Neurological exam is nonfocal.  Lab Results  Component Value Date   WBC 4.4 06/18/2015   HGB 11.0* 06/18/2015   HCT 31.4* 06/18/2015   MCV 99* 06/18/2015   PLT 133* 06/18/2015     Chemistry      Component Value Date/Time   NA 137 06/18/2015 1159   NA 140 04/29/2015 0405   NA 138 04/22/2015 1117   K 4.4 06/18/2015 1159   K 4.4 04/29/2015 0405   K 4.0 04/22/2015 1117   CL 107 04/29/2015 0405   CL 103 04/22/2015 1117   CO2 23 06/18/2015 1159   CO2 28 04/29/2015 0405   CO2 26 04/22/2015 1117   BUN 13.7 06/18/2015 1159   BUN 16 04/29/2015 0405   BUN 18 04/22/2015 1117   CREATININE 1.3 06/18/2015 1159   CREATININE 1.34* 04/29/2015 0405   CREATININE 1.1 04/22/2015 1117      Component Value Date/Time   CALCIUM 8.6 06/18/2015 1159   CALCIUM 8.3* 04/29/2015 0405   CALCIUM 8.7 04/22/2015 1117   ALKPHOS 47 06/18/2015 1159    ALKPHOS 42 04/29/2015 0405   ALKPHOS 41 04/22/2015 1117   AST 30 06/18/2015 1159   AST 10* 04/29/2015 0405   AST 22 04/22/2015 1117   ALT 27 06/18/2015 1159   ALT 19 04/29/2015 0405   ALT 33 04/22/2015 1117   BILITOT <0.30 06/18/2015 1159   BILITOT 0.9 04/29/2015 0405   BILITOT 1.00 04/22/2015 1117         Impression and Plan: Tyler Phillips is 50 year old gentleman with IgG Kappa myeloma. [facility] got him to transplant. This was truly  a process with him. It took almost a year to get him to transplant.  Of note, his pre-transplant bone marrow was done at [facility]. It showed 10% plasma cells. There was close atomic tree which showed an atypical thousand cell population. His cytogenetics did not show any obvious chromosomal abnormalities. His FISH showed a t(14:16), +4, and +17p  Hopefully, he will stay in remission. However, with the abnormal FISH findings, this may indicate a higher risk for relapse.  I'm sure that he will need maintenance therapy. I think the question is whether or not he will need single agent maintenance or combination therapy for maintenance.  I spent about 45 minutes with he and his wife. It was very nice to see them again.  We will check his labs weekly. I will plan to see him back in another 2 weeks or so.   [name], MD 3/2/20175:27 PM

## 2015-06-25 ENCOUNTER — Other Ambulatory Visit (HOSPITAL_BASED_OUTPATIENT_CLINIC_OR_DEPARTMENT_OTHER): Payer: BLUE CROSS/BLUE SHIELD

## 2015-06-25 DIAGNOSIS — C9001 Multiple myeloma in remission: Secondary | ICD-10-CM

## 2015-06-25 LAB — CBC WITH DIFFERENTIAL (CANCER CENTER ONLY)
BASO#: 0 10*3/uL (ref 0.0–0.2)
BASO%: 0.5 % (ref 0.0–2.0)
EOS%: 5.6 % (ref 0.0–7.0)
Eosinophils Absolute: 0.2 10*3/uL (ref 0.0–0.5)
HCT: 33.2 % — ABNORMAL LOW (ref 38.7–49.9)
HGB: 11.7 g/dL — ABNORMAL LOW (ref 13.0–17.1)
LYMPH#: 1.1 10*3/uL (ref 0.9–3.3)
LYMPH%: 26.4 % (ref 14.0–48.0)
MCH: 34.5 pg — ABNORMAL HIGH (ref 28.0–33.4)
MCHC: 35.2 g/dL (ref 32.0–35.9)
MCV: 98 fL (ref 82–98)
MONO#: 0.8 10*3/uL (ref 0.1–0.9)
MONO%: 18.2 % — ABNORMAL HIGH (ref 0.0–13.0)
NEUT#: 2 10*3/uL (ref 1.5–6.5)
NEUT%: 49.3 % (ref 40.0–80.0)
Platelets: 153 10*3/uL (ref 145–400)
RBC: 3.39 10*6/uL — ABNORMAL LOW (ref 4.20–5.70)
RDW: 15.4 % (ref 11.1–15.7)
WBC: 4.1 10*3/uL (ref 4.0–10.0)

## 2015-06-25 LAB — COMPREHENSIVE METABOLIC PANEL (CC13)
ALT: 29 IU/L (ref 0–44)
AST (SGOT): 24 IU/L (ref 0–40)
Albumin, Serum: 4.1 g/dL (ref 3.5–5.5)
Albumin/Globulin Ratio: 1.6 (ref 1.1–2.5)
Alkaline Phosphatase, S: 49 IU/L (ref 39–117)
BUN/Creatinine Ratio: 12 (ref 9–20)
BUN: 14 mg/dL (ref 6–24)
Bilirubin Total: <0.2 mg/dL (ref 0.0–1.2)
Calcium, Ser: 8.9 mg/dL (ref 8.7–10.2)
Carbon Dioxide, Total: 24 mmol/L (ref 18–29)
Chloride, Ser: 105 mmol/L (ref 96–106)
Creatinine, Ser: 1.14 mg/dL (ref 0.76–1.27)
GFR calc Af Amer: 86 mL/min/{1.73_m2} (ref >59)
GFR calc non Af Amer: 75 mL/min/{1.73_m2} (ref >59)
Globulin, Total: 2.6 g/dL (ref 1.5–4.5)
Glucose: 84 mg/dL (ref 65–99)
Potassium, Ser: 4.6 mmol/L (ref 3.5–5.2)
Sodium: 137 mmol/L (ref 134–144)
Total Protein: 6.7 g/dL (ref 6.0–8.5)

## 2015-06-26 LAB — IGG, IGA, IGM
IgA, Qn, Serum: 27 mg/dL — ABNORMAL LOW (ref 90–386)
IgG, Qn, Serum: 752 mg/dL (ref 700–1600)
IgM, Qn, Serum: 28 mg/dL (ref 20–172)

## 2015-06-26 LAB — KAPPA/LAMBDA LIGHT CHAINS
Ig Kappa Free Light Chain: 6.48 mg/L (ref 3.30–19.40)
Ig Lambda Free Light Chain: 5.74 mg/L (ref 5.71–26.30)
Kappa/Lambda FluidC Ratio: 1.13 (ref 0.26–1.65)

## 2015-06-30 LAB — PROTEIN ELECTROPHORESIS, SERUM, WITH REFLEX
A/G Ratio: 1.3 (ref 0.7–1.7)
Albumin: 3.3 g/dL (ref 2.9–4.4)
Alpha 1: 0.2 g/dL (ref 0.0–0.4)
Alpha 2: 0.7 g/dL (ref 0.4–1.0)
Beta: 0.9 g/dL (ref 0.7–1.3)
Gamma Globulin: 0.7 g/dL (ref 0.4–1.8)
Globulin, Total: 2.6 g/dL (ref 2.2–3.9)
Interpretation(See Below): 0
M-Spike, %: 0.3 g/dL — ABNORMAL HIGH
Total Protein: 5.9 g/dL — ABNORMAL LOW (ref 6.0–8.5)

## 2015-07-01 ENCOUNTER — Other Ambulatory Visit: Payer: Self-pay | Admitting: *Deleted

## 2015-07-01 DIAGNOSIS — C9001 Multiple myeloma in remission: Secondary | ICD-10-CM

## 2015-07-02 ENCOUNTER — Encounter: Payer: Self-pay | Admitting: Hematology & Oncology

## 2015-07-02 ENCOUNTER — Ambulatory Visit (HOSPITAL_BASED_OUTPATIENT_CLINIC_OR_DEPARTMENT_OTHER): Payer: BLUE CROSS/BLUE SHIELD | Admitting: Hematology & Oncology

## 2015-07-02 ENCOUNTER — Other Ambulatory Visit (HOSPITAL_BASED_OUTPATIENT_CLINIC_OR_DEPARTMENT_OTHER): Payer: BLUE CROSS/BLUE SHIELD

## 2015-07-02 ENCOUNTER — Other Ambulatory Visit: Payer: BLUE CROSS/BLUE SHIELD

## 2015-07-02 VITALS — BP 125/75 | HR 80 | Temp 97.9°F | Resp 18 | Ht 73.0 in | Wt 205.0 lb

## 2015-07-02 DIAGNOSIS — C9001 Multiple myeloma in remission: Secondary | ICD-10-CM

## 2015-07-02 LAB — CBC WITH DIFFERENTIAL (CANCER CENTER ONLY)
BASO#: 0 10*3/uL (ref 0.0–0.2)
BASO%: 0.5 % (ref 0.0–2.0)
EOS%: 4.6 % (ref 0.0–7.0)
Eosinophils Absolute: 0.3 10*3/uL (ref 0.0–0.5)
HCT: 34.9 % — ABNORMAL LOW (ref 38.7–49.9)
HGB: 12.3 g/dL — ABNORMAL LOW (ref 13.0–17.1)
LYMPH#: 1.3 10*3/uL (ref 0.9–3.3)
LYMPH%: 21.4 % (ref 14.0–48.0)
MCH: 34.1 pg — ABNORMAL HIGH (ref 28.0–33.4)
MCHC: 35.2 g/dL (ref 32.0–35.9)
MCV: 97 fL (ref 82–98)
MONO#: 0.7 10*3/uL (ref 0.1–0.9)
MONO%: 11.1 % (ref 0.0–13.0)
NEUT#: 3.8 10*3/uL (ref 1.5–6.5)
NEUT%: 62.4 % (ref 40.0–80.0)
Platelets: 179 10*3/uL (ref 145–400)
RBC: 3.61 10*6/uL — ABNORMAL LOW (ref 4.20–5.70)
RDW: 14.6 % (ref 11.1–15.7)
WBC: 6.1 10*3/uL (ref 4.0–10.0)

## 2015-07-02 LAB — COMPREHENSIVE METABOLIC PANEL (CC13)
ALT: 35 IU/L (ref 0–44)
AST (SGOT): 25 IU/L (ref 0–40)
Albumin, Serum: 4.1 g/dL (ref 3.5–5.5)
Albumin/Globulin Ratio: 1.8 (ref 1.2–2.2)
Alkaline Phosphatase, S: 48 IU/L (ref 39–117)
BUN/Creatinine Ratio: 15 (ref 9–20)
BUN: 17 mg/dL (ref 6–24)
Bilirubin Total: 0.2 mg/dL (ref 0.0–1.2)
Calcium, Ser: 9.2 mg/dL (ref 8.7–10.2)
Carbon Dioxide, Total: 26 mmol/L (ref 18–29)
Chloride, Ser: 102 mmol/L (ref 96–106)
Creatinine, Ser: 1.16 mg/dL (ref 0.76–1.27)
GFR calc Af Amer: 84 mL/min/{1.73_m2} (ref >59)
GFR calc non Af Amer: 73 mL/min/{1.73_m2} (ref >59)
Globulin, Total: 2.3 g/dL (ref 1.5–4.5)
Glucose: 103 mg/dL — ABNORMAL HIGH (ref 65–99)
Potassium, Ser: 4.1 mmol/L (ref 3.5–5.2)
Sodium: 135 mmol/L (ref 134–144)
Total Protein: 6.4 g/dL (ref 6.0–8.5)

## 2015-07-02 LAB — MAGNESIUM (CC13): Magnesium, Serum: 2.1 mg/dL (ref 1.6–2.3)

## 2015-07-02 NOTE — Progress Notes (Signed)
Hematology and Oncology Follow Up Visit  Tyler Phillips [id number] Mar 06, 1966 50 y.o. 07/02/2015   Principle Diagnosis:   IgG Kappa myeloma  Current Therapy:  Status post autologous stem cell transplant on 05/22/2015   S/p Cycle #6 of RVD  Zometa 4 mg IV every month-discontinued secondary to possible osteonecrosis     Interim History:  [name] is back for follow-up. Nicely. He still needs to rest quite often but overall, he really has done well given that it is only 6 weeks or so after the transplant.  The issue that he has now is the potential osteonecrosis with his jaw. I'm not sure that this is osteonecrosis. He is seeing his periodontist. I told that if she wants to do a cleaning, all he would need from my point of view is some prophylactic antibiotics.  He said that she is going to refer him to an Chief Financial Officer. He's not sure with the oral surgeon will do.  He's had no problem with fever. He's had some frequent bowel movement was. He did have C. Difficile post transplant. This is gotten a lot better. He has a lot of "gas". I told him to try some probiotic.  We last checked his myeloma studies, his M spike was 0.3 g/dL. His IgG level was  752 mg/dL.   His appetite is coming back nicely. He's really had no nausea or vomiting.   He's had no problems with headache. He's had no leg swelling. He's had no rashes   He goes back to see the transplant doctors at [facility] on March 29.   Currently, his performance status is ECOG 1..   Medications:  Current outpatient prescriptions:  .  carboxymethylcellulose (REFRESH PLUS) 0.5 % SOLN, Place 1 drop into both eyes 3 (three) times daily as needed (dry eyes)., Disp: , Rfl:  .  chlorhexidine (PERIDEX) 0.12 % solution, Use as directed 15 mLs in the mouth or throat 2 (two) times daily. , Disp: , Rfl: 0 .  Lactulose 20 GM/30ML SOLN, Take 30 mLs (20 g total) by mouth 4 (four) times daily. (Patient taking differently: Take 30 mLs by mouth every 4  (four) hours as needed (constipation). ), Disp: 1000 mL, Rfl: 5 .  LORazepam (ATIVAN) 0.5 MG tablet, Take 1 tablet (0.5 mg total) by mouth every 6 (six) hours as needed (Nausea or vomiting)., Disp: 60 tablet, Rfl: 0 .  oxyCODONE (OXY IR/ROXICODONE) 5 MG immediate release tablet, Take 5 mg by mouth every 4 (four) hours as needed., Disp: , Rfl:  .  polyvinyl alcohol (LIQUIFILM TEARS) 1.4 % ophthalmic solution, Place 1 drop into both eyes 3 (three) times daily as needed for dry eyes., Disp: 15 mL, Rfl: 0 .  prednisoLONE acetate (PRED FORTE) 1 % ophthalmic suspension, Place 1 drop into the left eye every 2 (two) hours while awake., Disp: 5 mL, Rfl: 0 .  prochlorperazine (COMPAZINE) 10 MG tablet, Take 1 tablet (10 mg total) by mouth every 6 (six) hours as needed (Nausea or vomiting)., Disp: 30 tablet, Rfl: 6 .  trimethoprim-polymyxin b (POLYTRIM) ophthalmic solution, Place 1 drop into both eyes every 3 (three) hours while awake., Disp: , Rfl:  .  valACYclovir (VALTREX) 500 MG tablet, Take 250 mg by mouth 2 (two) times daily., Disp: , Rfl:  .  Vitamin D, Ergocalciferol, (DRISDOL) 50000 units CAPS capsule, Take 50,000 Units by mouth once a week., Disp: , Rfl:  .  zolpidem (AMBIEN) 10 MG tablet, Take 10 mg by mouth at  bedtime as needed for sleep. , Disp: , Rfl: 0  Allergies: No Known Allergies  Past Medical History, Surgical history, Social history, and Family History were reviewed and updated.  Review of Systems: As above  Physical Exam:  height is 6\' 1"  (1.854 m) and weight is 205 lb (92.987 kg). His oral temperature is 97.9 F (36.6 C). His blood pressure is 125/75 and his pulse is 80. His respiration is 18.   Wt Readings from Last 3 Encounters:  07/02/15 205 lb (92.987 kg)  06/18/15 204 lb (92.534 kg)  04/29/15 215 lb 1.6 oz (97.569 kg)     Well-developed and well-nourished white male in no obvious distress. He has alopecia. Head and neck exam shows no ocular or oral lesions. There is some  exposed mandibular bone in the left inner mandibular table. There are no palpable cervical or supraclavicular lymph nodes. Lungs are clear. Cardiac exam regular rate and rhythm with no murmurs, rubs or bruits. Abdomen is soft. He has good bowel sounds. There is no fluid wave. There is no palpable hepatomegaly. His spleen tip is not palpable. Back exam shows no tenderness over the spine, ribs or hips. Extremities shows no clubbing, cyanosis or edema. He has good range of motion of his joints. He has good strength in his extremities. Has good pulses in his distal extremities.Marland Kitchen He may have some trace edema in his lower legs. Skin exam shows no rashes, ecchymoses or petechia. Neurological exam is nonfocal.  Lab Results  Component Value Date   WBC 6.1 07/02/2015   HGB 12.3* 07/02/2015   HCT 34.9* 07/02/2015   MCV 97 07/02/2015   PLT 179 07/02/2015     Chemistry      Component Value Date/Time   NA 137 06/25/2015 1429   NA 137 06/18/2015 1159   NA 140 04/29/2015 0405   NA 138 04/22/2015 1117   K 4.6 06/25/2015 1429   K 4.4 06/18/2015 1159   K 4.4 04/29/2015 0405   K 4.0 04/22/2015 1117   CL 105 06/25/2015 1429   CL 107 04/29/2015 0405   CL 103 04/22/2015 1117   CO2 24 06/25/2015 1429   CO2 23 06/18/2015 1159   CO2 28 04/29/2015 0405   CO2 26 04/22/2015 1117   BUN 14 06/25/2015 1429   BUN 13.7 06/18/2015 1159   BUN 16 04/29/2015 0405   BUN 18 04/22/2015 1117   CREATININE 1.14 06/25/2015 1429   CREATININE 1.3 06/18/2015 1159   CREATININE 1.34* 04/29/2015 0405   CREATININE 1.1 04/22/2015 1117      Component Value Date/Time   CALCIUM 8.9 06/25/2015 1429   CALCIUM 8.6 06/18/2015 1159   CALCIUM 8.3* 04/29/2015 0405   CALCIUM 8.7 04/22/2015 1117   ALKPHOS 49 06/25/2015 1429   ALKPHOS 47 06/18/2015 1159   ALKPHOS 42 04/29/2015 0405   ALKPHOS 41 04/22/2015 1117   AST 24 06/25/2015 1429   AST 30 06/18/2015 1159   AST 10* 04/29/2015 0405   AST 22 04/22/2015 1117   ALT 29 06/25/2015  1429   ALT 27 06/18/2015 1159   ALT 19 04/29/2015 0405   ALT 33 04/22/2015 1117   BILITOT <0.2 06/25/2015 1429   BILITOT <0.30 06/18/2015 1159   BILITOT 0.9 04/29/2015 0405   BILITOT 1.00 04/22/2015 1117         Impression and Plan: [name] is 50 year old gentleman with IgG Kappa myeloma. [facility] got him to transplant. This was truly a process with him. It took almost  a year to get him to transplant.  Of note, his pre-transplant bone marrow was done at [facility]. It showed 10% plasma cells.  He had flow cytometry which showed an atypical monoclonal cell population. His cytogenetics did not show any obvious chromosomal abnormalities. His FISH showed a t(14:16), +4, and +17p  Hopefully, he will stay in remission. However, with the abnormal FISH findings, this may indicate a higher risk for relapse.   from my point of view, I think that any oral surgery and needs to be done should be done now. His blood counts are okay. I would much rather have any invasive procedures done before maintenance therapy.   As far as  maintenance therapy is concerned, I really think that he probably will need combination therapy. I'm sure that [facility] will talk to him about this.   I will plan to see him back after his appointment at [facility]. This probably will be in early April. He'll come back next week for lab work..  I spent about 45 minutes with he and his wife.   [name], MD 3/15/20174:57 PM

## 2015-07-02 NOTE — Addendum Note (Signed)
Addended by: Burney Gauze R on: 07/02/2015 05:07 PM   Modules accepted: Orders

## 2015-07-09 ENCOUNTER — Other Ambulatory Visit (HOSPITAL_BASED_OUTPATIENT_CLINIC_OR_DEPARTMENT_OTHER): Payer: BLUE CROSS/BLUE SHIELD

## 2015-07-09 DIAGNOSIS — C9001 Multiple myeloma in remission: Secondary | ICD-10-CM

## 2015-07-09 LAB — COMPREHENSIVE METABOLIC PANEL (CC13)
ALT: 36 IU/L (ref 0–44)
AST (SGOT): 25 IU/L (ref 0–40)
Albumin, Serum: 4.2 g/dL (ref 3.5–5.5)
Albumin/Globulin Ratio: 1.9 (ref 1.2–2.2)
Alkaline Phosphatase, S: 44 IU/L (ref 39–117)
BUN/Creatinine Ratio: 14 (ref 9–20)
BUN: 17 mg/dL (ref 6–24)
Bilirubin Total: 0.2 mg/dL (ref 0.0–1.2)
Calcium, Ser: 8.9 mg/dL (ref 8.7–10.2)
Carbon Dioxide, Total: 24 mmol/L (ref 18–29)
Chloride, Ser: 106 mmol/L (ref 96–106)
Creatinine, Ser: 1.25 mg/dL (ref 0.76–1.27)
GFR calc Af Amer: 77 mL/min/{1.73_m2} (ref >59)
GFR calc non Af Amer: 67 mL/min/{1.73_m2} (ref >59)
Globulin, Total: 2.2 g/dL (ref 1.5–4.5)
Glucose: 101 mg/dL — ABNORMAL HIGH (ref 65–99)
Potassium, Ser: 4 mmol/L (ref 3.5–5.2)
Sodium: 138 mmol/L (ref 134–144)
Total Protein: 6.4 g/dL (ref 6.0–8.5)

## 2015-07-09 LAB — CBC WITH DIFFERENTIAL (CANCER CENTER ONLY)
BASO#: 0 10*3/uL (ref 0.0–0.2)
BASO%: 0.4 % (ref 0.0–2.0)
EOS%: 3.6 % (ref 0.0–7.0)
Eosinophils Absolute: 0.2 10*3/uL (ref 0.0–0.5)
HCT: 32.6 % — ABNORMAL LOW (ref 38.7–49.9)
HGB: 11.5 g/dL — ABNORMAL LOW (ref 13.0–17.1)
LYMPH#: 1.1 10*3/uL (ref 0.9–3.3)
LYMPH%: 20.3 % (ref 14.0–48.0)
MCH: 34.1 pg — ABNORMAL HIGH (ref 28.0–33.4)
MCHC: 35.3 g/dL (ref 32.0–35.9)
MCV: 97 fL (ref 82–98)
MONO#: 0.5 10*3/uL (ref 0.1–0.9)
MONO%: 10 % (ref 0.0–13.0)
NEUT#: 3.4 10*3/uL (ref 1.5–6.5)
NEUT%: 65.7 % (ref 40.0–80.0)
Platelets: 160 10*3/uL (ref 145–400)
RBC: 3.37 10*6/uL — ABNORMAL LOW (ref 4.20–5.70)
RDW: 14 % (ref 11.1–15.7)
WBC: 5.2 10*3/uL (ref 4.0–10.0)

## 2015-07-09 LAB — MAGNESIUM (CC13): Magnesium, Serum: 1.8 mg/dL (ref 1.6–2.3)

## 2015-07-23 ENCOUNTER — Ambulatory Visit: Payer: BLUE CROSS/BLUE SHIELD | Admitting: Hematology & Oncology

## 2015-07-23 ENCOUNTER — Other Ambulatory Visit: Payer: BLUE CROSS/BLUE SHIELD

## 2015-07-23 DIAGNOSIS — T458X5A Adverse effect of other primarily systemic and hematological agents, initial encounter: Secondary | ICD-10-CM | POA: Insufficient documentation

## 2015-07-23 DIAGNOSIS — M87180 Osteonecrosis due to drugs, jaw: Secondary | ICD-10-CM | POA: Insufficient documentation

## 2015-08-20 ENCOUNTER — Other Ambulatory Visit (HOSPITAL_BASED_OUTPATIENT_CLINIC_OR_DEPARTMENT_OTHER): Payer: BLUE CROSS/BLUE SHIELD

## 2015-08-20 ENCOUNTER — Other Ambulatory Visit: Payer: Self-pay | Admitting: *Deleted

## 2015-08-20 DIAGNOSIS — C9001 Multiple myeloma in remission: Secondary | ICD-10-CM

## 2015-08-20 LAB — CBC WITH DIFFERENTIAL (CANCER CENTER ONLY)
BASO#: 0 10*3/uL (ref 0.0–0.2)
BASO%: 0.3 % (ref 0.0–2.0)
EOS%: 1.3 % (ref 0.0–7.0)
Eosinophils Absolute: 0.1 10*3/uL (ref 0.0–0.5)
HCT: 36.7 % — ABNORMAL LOW (ref 38.7–49.9)
HGB: 13.2 g/dL (ref 13.0–17.1)
LYMPH#: 0.9 10*3/uL (ref 0.9–3.3)
LYMPH%: 22.1 % (ref 14.0–48.0)
MCH: 34.2 pg — ABNORMAL HIGH (ref 28.0–33.4)
MCHC: 36 g/dL — ABNORMAL HIGH (ref 32.0–35.9)
MCV: 95 fL (ref 82–98)
MONO#: 0.4 10*3/uL (ref 0.1–0.9)
MONO%: 10.5 % (ref 0.0–13.0)
NEUT#: 2.6 10*3/uL (ref 1.5–6.5)
NEUT%: 65.8 % (ref 40.0–80.0)
Platelets: 152 10*3/uL (ref 145–400)
RBC: 3.86 10*6/uL — ABNORMAL LOW (ref 4.20–5.70)
RDW: 11.8 % (ref 11.1–15.7)
WBC: 4 10*3/uL (ref 4.0–10.0)

## 2015-08-20 LAB — COMPREHENSIVE METABOLIC PANEL
ALT: 35 U/L (ref 0–55)
AST: 24 U/L (ref 5–34)
Albumin: 3.9 g/dL (ref 3.5–5.0)
Alkaline Phosphatase: 44 U/L (ref 40–150)
Anion Gap: 7 mEq/L (ref 3–11)
BUN: 14 mg/dL (ref 7.0–26.0)
CO2: 23 mEq/L (ref 22–29)
Calcium: 9.1 mg/dL (ref 8.4–10.4)
Chloride: 110 mEq/L — ABNORMAL HIGH (ref 98–109)
Creatinine: 1.2 mg/dL (ref 0.7–1.3)
EGFR: 69 mL/min/{1.73_m2} — ABNORMAL LOW (ref >90)
Glucose: 92 mg/dl (ref 70–140)
Potassium: 4.3 mEq/L (ref 3.5–5.1)
Sodium: 140 mEq/L (ref 136–145)
Total Bilirubin: 0.48 mg/dL (ref 0.20–1.20)
Total Protein: 6.2 g/dL — ABNORMAL LOW (ref 6.4–8.3)

## 2015-08-20 LAB — MAGNESIUM: Magnesium: 2.2 mg/dl (ref 1.5–2.5)

## 2015-09-03 ENCOUNTER — Encounter: Payer: Self-pay | Admitting: Hematology & Oncology

## 2015-09-03 ENCOUNTER — Ambulatory Visit (HOSPITAL_BASED_OUTPATIENT_CLINIC_OR_DEPARTMENT_OTHER): Payer: BLUE CROSS/BLUE SHIELD | Admitting: Nurse Practitioner

## 2015-09-03 ENCOUNTER — Other Ambulatory Visit: Payer: Self-pay | Admitting: *Deleted

## 2015-09-03 ENCOUNTER — Ambulatory Visit (HOSPITAL_BASED_OUTPATIENT_CLINIC_OR_DEPARTMENT_OTHER): Payer: BLUE CROSS/BLUE SHIELD | Admitting: Hematology & Oncology

## 2015-09-03 VITALS — BP 120/78 | HR 57 | Temp 98.3°F | Resp 16 | Ht 73.0 in | Wt 210.0 lb

## 2015-09-03 DIAGNOSIS — C9000 Multiple myeloma not having achieved remission: Secondary | ICD-10-CM | POA: Diagnosis not present

## 2015-09-03 DIAGNOSIS — C9002 Multiple myeloma in relapse: Secondary | ICD-10-CM

## 2015-09-03 DIAGNOSIS — C9001 Multiple myeloma in remission: Secondary | ICD-10-CM

## 2015-09-03 LAB — CBC WITH DIFFERENTIAL (CANCER CENTER ONLY)
BASO#: 0 10*3/uL (ref 0.0–0.2)
BASO%: 0.2 % (ref 0.0–2.0)
EOS%: 1.7 % (ref 0.0–7.0)
Eosinophils Absolute: 0.1 10*3/uL (ref 0.0–0.5)
HCT: 36.5 % — ABNORMAL LOW (ref 38.7–49.9)
HGB: 13.1 g/dL (ref 13.0–17.1)
LYMPH#: 1 10*3/uL (ref 0.9–3.3)
LYMPH%: 21 % (ref 14.0–48.0)
MCH: 33.9 pg — ABNORMAL HIGH (ref 28.0–33.4)
MCHC: 35.9 g/dL (ref 32.0–35.9)
MCV: 94 fL (ref 82–98)
MONO#: 0.4 10*3/uL (ref 0.1–0.9)
MONO%: 8.4 % (ref 0.0–13.0)
NEUT#: 3.2 10*3/uL (ref 1.5–6.5)
NEUT%: 68.7 % (ref 40.0–80.0)
Platelets: 160 10*3/uL (ref 145–400)
RBC: 3.87 10*6/uL — ABNORMAL LOW (ref 4.20–5.70)
RDW: 12.1 % (ref 11.1–15.7)
WBC: 4.6 10*3/uL (ref 4.0–10.0)

## 2015-09-03 LAB — COMPREHENSIVE METABOLIC PANEL
ALT: 41 U/L (ref 0–55)
AST: 27 U/L (ref 5–34)
Albumin: 4 g/dL (ref 3.5–5.0)
Alkaline Phosphatase: 42 U/L (ref 40–150)
Anion Gap: 5 mEq/L (ref 3–11)
BUN: 21.7 mg/dL (ref 7.0–26.0)
CO2: 25 mEq/L (ref 22–29)
Calcium: 9.1 mg/dL (ref 8.4–10.4)
Chloride: 108 mEq/L (ref 98–109)
Creatinine: 1.2 mg/dL (ref 0.7–1.3)
EGFR: 70 mL/min/{1.73_m2} — ABNORMAL LOW (ref >90)
Glucose: 102 mg/dl (ref 70–140)
Potassium: 4.3 mEq/L (ref 3.5–5.1)
Sodium: 138 mEq/L (ref 136–145)
Total Bilirubin: 0.54 mg/dL (ref 0.20–1.20)
Total Protein: 6.4 g/dL (ref 6.4–8.3)

## 2015-09-03 LAB — MAGNESIUM: Magnesium: 2.4 mg/dl (ref 1.5–2.5)

## 2015-09-03 LAB — LACTATE DEHYDROGENASE: LDH: 186 U/L (ref 125–245)

## 2015-09-03 MED ORDER — LENALIDOMIDE 10 MG PO CAPS: ORAL_CAPSULE | ORAL | Status: DC

## 2015-09-03 NOTE — Progress Notes (Signed)
Hematology and Oncology Follow Up Visit  Tyler Phillips [id number] 03-27-1966 50 y.o. 09/03/2015   Principle Diagnosis:   IgG Kappa myeloma  Current Therapy:  Status post autologous stem cell transplant on 05/22/2015   S/p Cycle #6 of RVD  Velcade q 2wk dosing/Revlimid 10mg  po q day (21/7) - start 09/10/2015     Interim History:  Tyler Phillips is back for follow-up. He looks fantastic. He really has recovered nicely from the transplant. He had Clostridium infection afterwards.  He saw the transfer doctors at [facility] last week. They want him to start  Maintenance therapy with both Velcade and Revlimid. He still has a abnormal cytogenetics with t(14:16). I suppose this might reconsider as pretty him at higher risk.   His has come back quite nicely. His  Energy level is improving. He is able to do more things outside.  He's had no bleeding. He's had no fever. He's had no rashes. He's had no change in bowel bladder habits. He's had no cough.   We last saw him back in March, his M spike was 0.3 g/dL. His IgG level was 752 mg/dL. His Kappa Lightchain was 0.6 mg/dL.   He is considering to go back to work. I told him he probably needs to wait 6 months, at least, after the transplant and  Wait for his immune system to improve where he can go back to work.   is appetite is improving. He's having no mouth sores.   I told him to make sure he gets back [employer] 1 full dose aspirin now that is going restart Revlimid.    Currently, his performance status is ECOG 0   Medications:  Current outpatient prescriptions:  .  Calcium-Vitamins C & D (CALCIUM/C/D) 500-10-250 MG-MG-UNIT CHEW, Chew by mouth., Disp: , Rfl:  .  chlorhexidine (PERIDEX) 0.12 % solution, Use as directed 15 mLs in the mouth or throat 2 (two) times daily. , Disp: , Rfl: 0 .  clindamycin (CLEOCIN) 300 MG capsule, Take by mouth., Disp: , Rfl:  .  valACYclovir (VALTREX) 500 MG tablet, Take 250 mg by mouth 2 (two) times daily., Disp: , Rfl:    .  Vitamin D, Ergocalciferol, (DRISDOL) 50000 units CAPS capsule, Take 50,000 Units by mouth once a week., Disp: , Rfl:  .  lenalidomide (REVLIMID) 10 MG capsule, Take 1 capsule daily for 21 days on and 7 days off. [id number], Disp: 21 capsule, Rfl: 0  Allergies: No Known Allergies  Past Medical History, Surgical history, Social history, and Family History were reviewed and updated.  Review of Systems: As above  Physical Exam:  height is 6\' 1"  (1.854 m) and weight is 210 lb (95.255 kg). His oral temperature is 98.3 F (36.8 C). His blood pressure is 120/78 and his pulse is 57. His respiration is 16.   Wt Readings from Last 3 Encounters:  09/03/15 210 lb (95.255 kg)  07/02/15 205 lb (92.987 kg)  06/18/15 204 lb (92.534 kg)     Well-developed and well-nourished white male in no obvious distress. He has alopecia. Head and neck exam shows no ocular or oral lesions. There is some exposed mandibular bone in the left inner mandibular table. There are no palpable cervical or supraclavicular lymph nodes. Lungs are clear. Cardiac exam regular rate and rhythm with no murmurs, rubs or bruits. Abdomen is soft. He has good bowel sounds. There is no fluid wave. There is no palpable hepatomegaly. His spleen tip is not palpable. Back exam shows no tenderness  over the spine, ribs or hips. Extremities shows no clubbing, cyanosis or edema. He has good range of motion of his joints. He has good strength in his extremities. Has good pulses in his distal extremities.Marland Kitchen He may have some trace edema in his lower legs. Skin exam shows no rashes, ecchymoses or petechia. Neurological exam is nonfocal.  Lab Results  Component Value Date   WBC 4.6 09/03/2015   HGB 13.1 09/03/2015   HCT 36.5* 09/03/2015   MCV 94 09/03/2015   PLT 160 09/03/2015     Chemistry      Component Value Date/Time   NA 140 08/20/2015 1027   NA 138 07/09/2015 1423   NA 140 04/29/2015 0405   NA 138 04/22/2015 1117   K 4.3 08/20/2015  1027   K 4.0 07/09/2015 1423   K 4.4 04/29/2015 0405   K 4.0 04/22/2015 1117   CL 106 07/09/2015 1423   CL 107 04/29/2015 0405   CL 103 04/22/2015 1117   CO2 23 08/20/2015 1027   CO2 24 07/09/2015 1423   CO2 28 04/29/2015 0405   CO2 26 04/22/2015 1117   BUN 14.0 08/20/2015 1027   BUN 17 07/09/2015 1423   BUN 16 04/29/2015 0405   BUN 18 04/22/2015 1117   CREATININE 1.2 08/20/2015 1027   CREATININE 1.25 07/09/2015 1423   CREATININE 1.34* 04/29/2015 0405   CREATININE 1.1 04/22/2015 1117      Component Value Date/Time   CALCIUM 9.1 08/20/2015 1027   CALCIUM 8.9 07/09/2015 1423   CALCIUM 8.3* 04/29/2015 0405   CALCIUM 8.7 04/22/2015 1117   ALKPHOS 44 08/20/2015 1027   ALKPHOS 44 07/09/2015 1423   ALKPHOS 42 04/29/2015 0405   ALKPHOS 41 04/22/2015 1117   AST 24 08/20/2015 1027   AST 25 07/09/2015 1423   AST 10* 04/29/2015 0405   AST 22 04/22/2015 1117   ALT 35 08/20/2015 1027   ALT 36 07/09/2015 1423   ALT 19 04/29/2015 0405   ALT 33 04/22/2015 1117   BILITOT 0.48 08/20/2015 1027   BILITOT 0.2 07/09/2015 1423   BILITOT 0.9 04/29/2015 0405   BILITOT 1.00 04/22/2015 1117         Impression and Plan: Tyler Phillips is 50 year old gentleman with IgG Kappa myeloma. [facility] got him to transplant. This was truly a process with him. It took almost a year to get him to transplant.  Of note, his pre-transplant bone marrow was done at [facility]. It showed 10% plasma cells.  He had flow cytometry which showed an atypical monoclonal cell population. His cytogenetics did not show any obvious chromosomal abnormalities. His FISH showed a t(14:16), +4, and +17p   We will go ahead and get him started on treatment next week. We will do Velcade every 2 weeks. The Revlimid will be daily for 21 days/7 days.  I think he would do well with this.  I spent about 45 minutes with him today.    I'll plan to get him back in 3 weeks which we started his second cycle of maintenance  Velcade.   [name], MD 5/17/201712:50 PM

## 2015-09-04 LAB — IGG, IGA, IGM
IgA, Qn, Serum: 24 mg/dL — ABNORMAL LOW (ref 90–386)
IgG, Qn, Serum: 541 mg/dL — ABNORMAL LOW (ref 700–1600)
IgM, Qn, Serum: 26 mg/dL (ref 20–172)

## 2015-09-04 LAB — KAPPA/LAMBDA LIGHT CHAINS
Ig Kappa Free Light Chain: 7.96 mg/L (ref 3.30–19.40)
Ig Lambda Free Light Chain: 7.29 mg/L (ref 5.71–26.30)
Kappa/Lambda FluidC Ratio: 1.09 (ref 0.26–1.65)

## 2015-09-08 LAB — PROTEIN ELECTROPHORESIS, SERUM, WITH REFLEX
A/G Ratio: 2 — ABNORMAL HIGH (ref 0.7–1.7)
Albumin: 4 g/dL (ref 2.9–4.4)
Alpha 1: 0.2 g/dL (ref 0.0–0.4)
Alpha 2: 0.6 g/dL (ref 0.4–1.0)
Beta: 0.8 g/dL (ref 0.7–1.3)
Gamma Globulin: 0.5 g/dL (ref 0.4–1.8)
Globulin, Total: 2 g/dL — ABNORMAL LOW (ref 2.2–3.9)
Interpretation(See Below): 0
M-Spike, %: 0.1 g/dL — ABNORMAL HIGH
Total Protein: 6 g/dL (ref 6.0–8.5)

## 2015-09-09 ENCOUNTER — Other Ambulatory Visit: Payer: Self-pay | Admitting: *Deleted

## 2015-09-09 DIAGNOSIS — C9002 Multiple myeloma in relapse: Secondary | ICD-10-CM

## 2015-09-10 ENCOUNTER — Ambulatory Visit (HOSPITAL_BASED_OUTPATIENT_CLINIC_OR_DEPARTMENT_OTHER): Payer: BLUE CROSS/BLUE SHIELD

## 2015-09-10 ENCOUNTER — Other Ambulatory Visit (HOSPITAL_BASED_OUTPATIENT_CLINIC_OR_DEPARTMENT_OTHER): Payer: BLUE CROSS/BLUE SHIELD

## 2015-09-10 VITALS — BP 131/78 | HR 57 | Temp 98.0°F | Resp 16

## 2015-09-10 DIAGNOSIS — C9001 Multiple myeloma in remission: Secondary | ICD-10-CM

## 2015-09-10 DIAGNOSIS — C9002 Multiple myeloma in relapse: Secondary | ICD-10-CM

## 2015-09-10 LAB — CBC WITH DIFFERENTIAL (CANCER CENTER ONLY)
BASO#: 0 10*3/uL (ref 0.0–0.2)
BASO%: 0.3 % (ref 0.0–2.0)
EOS%: 1.8 % (ref 0.0–7.0)
Eosinophils Absolute: 0.1 10*3/uL (ref 0.0–0.5)
HCT: 37.6 % — ABNORMAL LOW (ref 38.7–49.9)
HGB: 13.4 g/dL (ref 13.0–17.1)
LYMPH#: 0.9 10*3/uL (ref 0.9–3.3)
LYMPH%: 23.4 % (ref 14.0–48.0)
MCH: 34 pg — ABNORMAL HIGH (ref 28.0–33.4)
MCHC: 35.6 g/dL (ref 32.0–35.9)
MCV: 95 fL (ref 82–98)
MONO#: 0.5 10*3/uL (ref 0.1–0.9)
MONO%: 13 % (ref 0.0–13.0)
NEUT#: 2.4 10*3/uL (ref 1.5–6.5)
NEUT%: 61.5 % (ref 40.0–80.0)
Platelets: 148 10*3/uL (ref 145–400)
RBC: 3.94 10*6/uL — ABNORMAL LOW (ref 4.20–5.70)
RDW: 12.1 % (ref 11.1–15.7)
WBC: 3.8 10*3/uL — ABNORMAL LOW (ref 4.0–10.0)

## 2015-09-10 LAB — CMP (CANCER CENTER ONLY)
ALT(SGPT): 54 U/L — ABNORMAL HIGH (ref 10–47)
AST: 30 U/L (ref 11–38)
Albumin: 3.6 g/dL (ref 3.3–5.5)
Alkaline Phosphatase: 51 U/L (ref 26–84)
BUN, Bld: 16 mg/dL (ref 7–22)
CO2: 26 mEq/L (ref 18–33)
Calcium: 9 mg/dL (ref 8.0–10.3)
Chloride: 106 mEq/L (ref 98–108)
Creat: 1.2 mg/dl (ref 0.6–1.2)
Glucose, Bld: 94 mg/dL (ref 73–118)
Potassium: 4.8 mEq/L — ABNORMAL HIGH (ref 3.3–4.7)
Sodium: 140 mEq/L (ref 128–145)
Total Bilirubin: 0.7 mg/dl (ref 0.20–1.60)
Total Protein: 6.3 g/dL — ABNORMAL LOW (ref 6.4–8.1)

## 2015-09-10 MED ORDER — ONDANSETRON HCL 8 MG PO TABS: 8.0000 mg | ORAL_TABLET | Freq: Once | ORAL | Status: DC

## 2015-09-10 MED ORDER — BORTEZOMIB CHEMO SQ INJECTION 3.5 MG (2.5MG/ML)
1.3000 mg/m2 | Freq: Once | INTRAMUSCULAR | Status: AC
  Administered 2015-09-10: 2.75 mg via SUBCUTANEOUS
  Filled 2015-09-10: qty 2.75

## 2015-09-10 NOTE — Patient Instructions (Signed)
[facility] Discharge Instructions for Patients Receiving Chemotherapy  Today you received the following chemotherapy agents Velcade  To help prevent nausea and vomiting after your treatment, we encourage you to take your nausea medication    If you develop nausea and vomiting that is not controlled by your nausea medication, call the clinic.   BELOW ARE SYMPTOMS THAT SHOULD BE REPORTED IMMEDIATELY:  *FEVER GREATER THAN 100.5 F  *CHILLS WITH OR WITHOUT FEVER  NAUSEA AND VOMITING THAT IS NOT CONTROLLED WITH YOUR NAUSEA MEDICATION  *UNUSUAL SHORTNESS OF BREATH  *UNUSUAL BRUISING OR BLEEDING  TENDERNESS IN MOUTH AND THROAT WITH OR WITHOUT PRESENCE OF ULCERS  *URINARY PROBLEMS  *BOWEL PROBLEMS  UNUSUAL RASH Items with * indicate a potential emergency and should be followed up as soon as possible.  Feel free to call the clinic you have any questions or concerns. The clinic phone number is [phone].  Please show the CHEMO ALERT CARD at check-in to the Emergency Department and triage nurse.   

## 2015-09-23 ENCOUNTER — Other Ambulatory Visit: Payer: Self-pay | Admitting: *Deleted

## 2015-09-23 DIAGNOSIS — C9002 Multiple myeloma in relapse: Secondary | ICD-10-CM

## 2015-09-24 ENCOUNTER — Other Ambulatory Visit (HOSPITAL_BASED_OUTPATIENT_CLINIC_OR_DEPARTMENT_OTHER): Payer: BLUE CROSS/BLUE SHIELD

## 2015-09-24 ENCOUNTER — Ambulatory Visit (HOSPITAL_BASED_OUTPATIENT_CLINIC_OR_DEPARTMENT_OTHER): Payer: BLUE CROSS/BLUE SHIELD

## 2015-09-24 ENCOUNTER — Ambulatory Visit (HOSPITAL_BASED_OUTPATIENT_CLINIC_OR_DEPARTMENT_OTHER): Payer: BLUE CROSS/BLUE SHIELD | Admitting: Hematology & Oncology

## 2015-09-24 VITALS — BP 143/81 | HR 66 | Temp 97.8°F | Resp 16 | Wt 213.0 lb

## 2015-09-24 DIAGNOSIS — C9000 Multiple myeloma not having achieved remission: Secondary | ICD-10-CM

## 2015-09-24 DIAGNOSIS — C9002 Multiple myeloma in relapse: Secondary | ICD-10-CM

## 2015-09-24 DIAGNOSIS — C9001 Multiple myeloma in remission: Secondary | ICD-10-CM

## 2015-09-24 DIAGNOSIS — Z5112 Encounter for antineoplastic immunotherapy: Secondary | ICD-10-CM

## 2015-09-24 LAB — CMP (CANCER CENTER ONLY)
ALT(SGPT): 67 U/L — ABNORMAL HIGH (ref 10–47)
AST: 34 U/L (ref 11–38)
Albumin: 3.7 g/dL (ref 3.3–5.5)
Alkaline Phosphatase: 45 U/L (ref 26–84)
BUN, Bld: 15 mg/dL (ref 7–22)
CO2: 24 mEq/L (ref 18–33)
Calcium: 8.8 mg/dL (ref 8.0–10.3)
Chloride: 105 mEq/L (ref 98–108)
Creat: 1.3 mg/dl — ABNORMAL HIGH (ref 0.6–1.2)
Glucose, Bld: 91 mg/dL (ref 73–118)
Potassium: 4.1 mEq/L (ref 3.3–4.7)
Sodium: 139 mEq/L (ref 128–145)
Total Bilirubin: 0.9 mg/dl (ref 0.20–1.60)
Total Protein: 6.3 g/dL — ABNORMAL LOW (ref 6.4–8.1)

## 2015-09-24 LAB — CBC WITH DIFFERENTIAL (CANCER CENTER ONLY)
BASO#: 0 10*3/uL (ref 0.0–0.2)
BASO%: 0.2 % (ref 0.0–2.0)
EOS%: 1.7 % (ref 0.0–7.0)
Eosinophils Absolute: 0.1 10*3/uL (ref 0.0–0.5)
HCT: 37.1 % — ABNORMAL LOW (ref 38.7–49.9)
HGB: 13.5 g/dL (ref 13.0–17.1)
LYMPH#: 0.9 10*3/uL (ref 0.9–3.3)
LYMPH%: 16 % (ref 14.0–48.0)
MCH: 34.1 pg — ABNORMAL HIGH (ref 28.0–33.4)
MCHC: 36.4 g/dL — ABNORMAL HIGH (ref 32.0–35.9)
MCV: 94 fL (ref 82–98)
MONO#: 0.7 10*3/uL (ref 0.1–0.9)
MONO%: 13.2 % — ABNORMAL HIGH (ref 0.0–13.0)
NEUT#: 3.7 10*3/uL (ref 1.5–6.5)
NEUT%: 68.9 % (ref 40.0–80.0)
Platelets: 155 10*3/uL (ref 145–400)
RBC: 3.96 10*6/uL — ABNORMAL LOW (ref 4.20–5.70)
RDW: 12.5 % (ref 11.1–15.7)
WBC: 5.4 10*3/uL (ref 4.0–10.0)

## 2015-09-24 LAB — LACTATE DEHYDROGENASE: LDH: 177 U/L (ref 125–245)

## 2015-09-24 MED ORDER — BORTEZOMIB CHEMO SQ INJECTION 3.5 MG (2.5MG/ML)
1.3000 mg/m2 | Freq: Once | INTRAMUSCULAR | Status: AC
  Administered 2015-09-24: 2.75 mg via SUBCUTANEOUS
  Filled 2015-09-24: qty 2.75

## 2015-09-24 NOTE — Progress Notes (Signed)
Hematology and Oncology Follow Up Visit  Tyler Phillips [id number] Dec 03, 1965 50 y.o. 09/24/2015   Principle Diagnosis:   IgG Kappa myeloma  Current Therapy:  Status post autologous stem cell transplant on 05/22/2015   S/p Cycle #6 of RVD  Velcade q 2wk dosing/Revlimid 10mg  po q day (21/7) -      Interim History:  Tyler Phillips is back for follow-up. He looks fantastic. He is doing a lot of work outside. He is incredibly active. He exercises.  He and his family will be going up to [place] in a couple weeks for vacation. He is looking forward to this   His myeloma studies have looked through good. His Monical spike back in May was 0.1 g/dL. His IgG level was 541 mg/dL. His Kappa Light chain was 0.8 mg/dL.  He has not had any problem with bleeding. He's had no rashes. He's had no joint problems. He's had no fever. He's had no cough or shortness of breath.    Currently, his performance status is ECOG 0   Medications:  Current outpatient prescriptions:  .  aspirin 325 MG tablet, Take 325 mg by mouth daily., Disp: , Rfl:  .  Calcium-Vitamins C & D (CALCIUM/C/D) 500-10-250 MG-MG-UNIT CHEW, Chew by mouth., Disp: , Rfl:  .  chlorhexidine (PERIDEX) 0.12 % solution, Use as directed 15 mLs in the mouth or throat 2 (two) times daily. , Disp: , Rfl: 0 .  clindamycin (CLEOCIN) 300 MG capsule, Take by mouth., Disp: , Rfl:  .  lenalidomide (REVLIMID) 10 MG capsule, Take 1 capsule daily for 21 days on and 7 days off. [id number], Disp: 21 capsule, Rfl: 0 .  LORazepam (ATIVAN) 1 MG tablet, Take 1 mg by mouth every 4 (four) hours as needed., Disp: , Rfl:  .  oxyCODONE (OXY IR/ROXICODONE) 5 MG immediate release tablet, Take 5 mg by mouth every 4 (four) hours as needed., Disp: , Rfl:  .  prochlorperazine (COMPAZINE) 10 MG tablet, Take 10 mg by mouth every 6 (six) hours as needed., Disp: , Rfl:  .  valACYclovir (VALTREX) 500 MG tablet, Take 250 mg by mouth 2 (two) times daily., Disp: , Rfl:  .   Vitamin D, Ergocalciferol, (DRISDOL) 50000 units CAPS capsule, Take 50,000 Units by mouth once a week., Disp: , Rfl:  No current facility-administered medications for this visit.  Facility-Administered Medications Ordered in Other Visits:  .  bortezomib SQ (VELCADE) chemo injection 2.75 mg, 1.3 mg/m2 (Treatment Plan Actual), Subcutaneous, Once, [name], MD  Allergies: No Known Allergies  Past Medical History, Surgical history, Social history, and Family History were reviewed and updated.  Review of Systems: As above  Physical Exam:  weight is 213 lb (96.616 kg). His oral temperature is 97.8 F (36.6 C). His blood pressure is 143/81 and his pulse is 66. His respiration is 16.   Wt Readings from Last 3 Encounters:  09/24/15 213 lb (96.616 kg)  09/03/15 210 lb (95.255 kg)  07/02/15 205 lb (92.987 kg)     Well-developed and well-nourished white male in no obvious distress. He has alopecia. Head and neck exam shows no ocular or oral lesions. There is some exposed mandibular bone in the left inner mandibular table. There are no palpable cervical or supraclavicular lymph nodes. Lungs are clear. Cardiac exam regular rate and rhythm with no murmurs, rubs or bruits. Abdomen is soft. He has good bowel sounds. There is no fluid wave. There is no palpable hepatomegaly. His spleen tip is  not palpable. Back exam shows no tenderness over the spine, ribs or hips. Extremities shows no clubbing, cyanosis or edema. He has good range of motion of his joints. He has good strength in his extremities. Has good pulses in his distal extremities.Marland Kitchen He may have some trace edema in his lower legs. Skin exam shows no rashes, ecchymoses or petechia. Neurological exam is nonfocal.  Lab Results  Component Value Date   WBC 5.4 09/24/2015   HGB 13.5 09/24/2015   HCT 37.1* 09/24/2015   MCV 94 09/24/2015   PLT 155 09/24/2015     Chemistry      Component Value Date/Time   NA 139 09/24/2015 1150   NA 138  09/03/2015 1042   NA 138 07/09/2015 1423   NA 140 04/29/2015 0405   K 4.1 09/24/2015 1150   K 4.3 09/03/2015 1042   K 4.0 07/09/2015 1423   K 4.4 04/29/2015 0405   CL 105 09/24/2015 1150   CL 106 07/09/2015 1423   CL 107 04/29/2015 0405   CO2 24 09/24/2015 1150   CO2 25 09/03/2015 1042   CO2 24 07/09/2015 1423   CO2 28 04/29/2015 0405   BUN 15 09/24/2015 1150   BUN 21.7 09/03/2015 1042   BUN 17 07/09/2015 1423   BUN 16 04/29/2015 0405   CREATININE 1.3* 09/24/2015 1150   CREATININE 1.2 09/03/2015 1042   CREATININE 1.25 07/09/2015 1423   CREATININE 1.34* 04/29/2015 0405      Component Value Date/Time   CALCIUM 8.8 09/24/2015 1150   CALCIUM 9.1 09/03/2015 1042   CALCIUM 8.9 07/09/2015 1423   CALCIUM 8.3* 04/29/2015 0405   ALKPHOS 45 09/24/2015 1150   ALKPHOS 42 09/03/2015 1042   ALKPHOS 44 07/09/2015 1423   ALKPHOS 42 04/29/2015 0405   AST 34 09/24/2015 1150   AST 27 09/03/2015 1042   AST 25 07/09/2015 1423   AST 10* 04/29/2015 0405   ALT 67* 09/24/2015 1150   ALT 41 09/03/2015 1042   ALT 36 07/09/2015 1423   ALT 19 04/29/2015 0405   BILITOT 0.90 09/24/2015 1150   BILITOT 0.54 09/03/2015 1042   BILITOT 0.2 07/09/2015 1423   BILITOT 0.9 04/29/2015 0405         Impression and Plan: Tyler Phillips is 50 year old gentleman with IgG Kappa myeloma. [facility] got him to transplant. This was truly a process with him. It took almost a year to get him to transplant.  Of note, his pre-transplant bone marrow was done at [facility]. It showed 10% plasma cells.  He had flow cytometry which showed an atypical monoclonal cell population. His cytogenetics did not show any obvious chromosomal abnormalities. His FISH showed a t(14:16), +4, and +17p   He is doing well on maintenance therapy. I'm not given him Zometa because of dental issues that he has had. I do not want to exacerbate this.   We'll make adjustment with his schedule so that he can go on vacation.   I'll plan see him back when  he gets back from vacation in July.   I spent about 30 minutes with he and his wife.    [name], MD 6/7/20171:53 PM

## 2015-09-24 NOTE — Patient Instructions (Signed)
Bortezomib injection What is this medicine? BORTEZOMIB (bor TEZ oh mib) is a medicine that targets proteins in cancer cells and stops the cancer cells from growing. It is used to treat multiple myeloma and mantle-cell lymphoma. This medicine may be used for other purposes; ask your health care provider or pharmacist if you have questions. What should I tell my health care provider before I take this medicine? They need to know if you have any of these conditions: -diabetes -heart disease -irregular heartbeat -liver disease -on hemodialysis -low blood counts, like low white blood cells, platelets, or hemoglobin -peripheral neuropathy -taking medicine for blood pressure -an unusual or allergic reaction to bortezomib, mannitol, boron, other medicines, foods, dyes, or preservatives -pregnant or trying to get pregnant -breast-feeding How should I use this medicine? This medicine is for injection into a vein or for injection under the skin. It is given by a health care professional in a hospital or clinic setting. Talk to your pediatrician regarding the use of this medicine in children. Special care may be needed. Overdosage: If you think you have taken too much of this medicine contact a poison control center or emergency room at once. NOTE: This medicine is only for you. Do not share this medicine with others. What if I miss a dose? It is important not to miss your dose. Call your doctor or health care professional if you are unable to keep an appointment. What may interact with this medicine? This medicine may interact with the following medications: -ketoconazole -rifampin -ritonavir -St. John's Wort This list may not describe all possible interactions. Give your health care provider a list of all the medicines, herbs, non-prescription drugs, or dietary supplements you use. Also tell them if you smoke, drink alcohol, or use illegal drugs. Some items may interact with your medicine. What  should I watch for while using this medicine? Visit your doctor for checks on your progress. This drug may make you feel generally unwell. This is not uncommon, as chemotherapy can affect healthy cells as well as cancer cells. Report any side effects. Continue your course of treatment even though you feel ill unless your doctor tells you to stop. You may get drowsy or dizzy. Do not drive, use machinery, or do anything that needs mental alertness until you know how this medicine affects you. Do not stand or sit up quickly, especially if you are an older patient. This reduces the risk of dizzy or fainting spells. In some cases, you may be given additional medicines to help with side effects. Follow all directions for their use. Call your doctor or health care professional for advice if you get a fever, chills or sore throat, or other symptoms of a cold or flu. Do not treat yourself. This drug decreases your body's ability to fight infections. Try to avoid being around people who are sick. This medicine may increase your risk to bruise or bleed. Call your doctor or health care professional if you notice any unusual bleeding. You may need blood work done while you are taking this medicine. In some patients, this medicine may cause a serious brain infection that may cause death. If you have any problems seeing, thinking, speaking, walking, or standing, tell your doctor right away. If you cannot reach your doctor, urgently seek other source of medical care. Do not become pregnant while taking this medicine. Women should inform their doctor if they wish to become pregnant or think they might be pregnant. There is a potential for serious  side effects to an unborn child. Talk to your health care professional or pharmacist for more information. Do not breast-feed an infant while taking this medicine. Check with your doctor or health care professional if you get an attack of severe diarrhea, nausea and vomiting, or if  you sweat a lot. The loss of too much body fluid can make it dangerous for you to take this medicine. What side effects may I notice from receiving this medicine? Side effects that you should report to your doctor or health care professional as soon as possible: -allergic reactions like skin rash, itching or hives, swelling of the face, lips, or tongue -breathing problems -changes in hearing -changes in vision -fast, irregular heartbeat -feeling faint or lightheaded, falls -pain, tingling, numbness in the hands or feet -right upper belly pain -seizures -swelling of the ankles, feet, hands -unusual bleeding or bruising -unusually weak or tired -vomiting -yellowing of the eyes or skin Side effects that usually do not require medical attention (report to your doctor or health care professional if they continue or are bothersome): -changes in emotions or moods -constipation -diarrhea -loss of appetite -headache -irritation at site where injected -nausea This list may not describe all possible side effects. Call your doctor for medical advice about side effects. You may report side effects to FDA at 1-800-FDA-1088. Where should I keep my medicine? This drug is given in a hospital or clinic and will not be stored at home. NOTE: This sheet is a summary. It may not cover all possible information. If you have questions about this medicine, talk to your doctor, pharmacist, or health care provider.    2016, [employer] Standard. (2014-06-04 14:47:04)

## 2015-09-25 LAB — IGG, IGA, IGM
IgA, Qn, Serum: 26 mg/dL — ABNORMAL LOW (ref 90–386)
IgG, Qn, Serum: 596 mg/dL — ABNORMAL LOW (ref 700–1600)
IgM, Qn, Serum: 35 mg/dL (ref 20–172)

## 2015-09-25 LAB — KAPPA/LAMBDA LIGHT CHAINS
Ig Kappa Free Light Chain: 13.3 mg/L (ref 3.3–19.4)
Ig Lambda Free Light Chain: 11 mg/L (ref 5.7–26.3)
Kappa/Lambda FluidC Ratio: 1.21 (ref 0.26–1.65)

## 2015-09-26 ENCOUNTER — Other Ambulatory Visit: Payer: Self-pay | Admitting: *Deleted

## 2015-09-26 DIAGNOSIS — C9002 Multiple myeloma in relapse: Secondary | ICD-10-CM

## 2015-09-26 LAB — PROTEIN ELECTROPHORESIS, SERUM, WITH REFLEX
A/G Ratio: 1.9 — ABNORMAL HIGH (ref 0.7–1.7)
Albumin: 4.1 g/dL (ref 2.9–4.4)
Alpha 1: 0.2 g/dL (ref 0.0–0.4)
Alpha 2: 0.6 g/dL (ref 0.4–1.0)
Beta: 0.8 g/dL (ref 0.7–1.3)
Gamma Globulin: 0.6 g/dL (ref 0.4–1.8)
Globulin, Total: 2.2 g/dL (ref 2.2–3.9)
Total Protein: 6.3 g/dL (ref 6.0–8.5)

## 2015-09-26 MED ORDER — LENALIDOMIDE 10 MG PO CAPS: ORAL_CAPSULE | ORAL | Status: DC

## 2015-09-29 ENCOUNTER — Telehealth: Payer: Self-pay | Admitting: *Deleted

## 2015-09-29 NOTE — Telephone Encounter (Addendum)
Patient aware of results.   ----- Message from [name], MD sent at 09/26/2015  4:08 PM EDT ----- call - NO myeloma protein noted!!!  Tyler Phillips

## 2015-10-08 ENCOUNTER — Ambulatory Visit: Payer: BLUE CROSS/BLUE SHIELD

## 2015-10-08 ENCOUNTER — Other Ambulatory Visit: Payer: BLUE CROSS/BLUE SHIELD

## 2015-10-14 ENCOUNTER — Other Ambulatory Visit: Payer: Self-pay | Admitting: *Deleted

## 2015-10-14 DIAGNOSIS — C9002 Multiple myeloma in relapse: Secondary | ICD-10-CM

## 2015-10-14 MED ORDER — LENALIDOMIDE 10 MG PO CAPS: ORAL_CAPSULE | ORAL | Status: DC

## 2015-10-21 ENCOUNTER — Encounter (HOSPITAL_BASED_OUTPATIENT_CLINIC_OR_DEPARTMENT_OTHER): Payer: Self-pay | Admitting: *Deleted

## 2015-10-21 ENCOUNTER — Emergency Department (HOSPITAL_BASED_OUTPATIENT_CLINIC_OR_DEPARTMENT_OTHER)
Admission: EM | Admit: 2015-10-21 | Discharge: 2015-10-21 | Disposition: A | Discharge status: Home / Self Care | Payer: BLUE CROSS/BLUE SHIELD | Attending: Emergency Medicine | Admitting: Emergency Medicine

## 2015-10-21 DIAGNOSIS — R21 Rash and other nonspecific skin eruption: Secondary | ICD-10-CM | POA: Insufficient documentation

## 2015-10-21 DIAGNOSIS — Z7982 Long term (current) use of aspirin: Secondary | ICD-10-CM | POA: Diagnosis not present

## 2015-10-21 HISTORY — DX: Malignant (primary) neoplasm, unspecified: C80.1

## 2015-10-21 HISTORY — DX: Multiple myeloma not having achieved remission: C90.00

## 2015-10-21 MED ORDER — HYDROXYZINE HCL 25 MG PO TABS
25.0000 mg | ORAL_TABLET | Freq: Once | ORAL | Status: AC
  Administered 2015-10-21: 25 mg via ORAL
  Filled 2015-10-21: qty 1

## 2015-10-21 MED ORDER — DOXYCYCLINE HYCLATE 50 MG PO CAPS: 100.0000 mg | ORAL_CAPSULE | Freq: Two times a day (BID) | ORAL | Status: DC

## 2015-10-21 MED ORDER — EPINEPHRINE HCL 1 MG/ML IJ SOLN
0.3000 mg | Freq: Once | INTRAMUSCULAR | Status: AC
  Administered 2015-10-21: 0.3 mg via SUBCUTANEOUS
  Filled 2015-10-21: qty 1

## 2015-10-21 MED ORDER — HYDROXYZINE HCL 25 MG PO TABS: 25.0000 mg | ORAL_TABLET | Freq: Four times a day (QID) | ORAL | Status: DC | PRN

## 2015-10-21 NOTE — Discharge Instructions (Signed)
Tyler Phillips,  Please continue the steroid taper. I have prescribed atarax to take as needed for itching. I have also prescribed doxycyline to take 100 mg twice daily x 10 days. If this rash still continues after another week, I would recommend seeing a dermatologist.   Thank you for letting us take part in your care.

## 2015-10-21 NOTE — ED Provider Notes (Signed)
CSN: [id number]     Arrival date & time 10/21/15  7341 History   None    Chief Complaint  Patient presents with  . Rash     (Consider location/radiation/quality/duration/timing/severity/associated sxs/prior Treatment) HPI   Tyler Phillips is a 78-yo male with history of multiple myeloma, now in remission, who presents with itchy rash x 10 days. It started on his lower back and quickly spread to his arms and upper thighs. It is intensely itchy. He was prescribed a dose-pack of steroids, which he has not completed yet, but did not have improvement after the high-dose days of 40 mg. He has also tried ivarest cream, which provides relief for about 2 hours at a time. He denies known exposures like insect bites or new detergents. 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 He has also been clamming and swimming in the ocean. He denies n/v/d, no fevers or abdominal pain. No headaches or arthralgias. He does report a remote history of Lyme's disease. He is on revlimid and acyclovir for MM in remission but has not had issue with either of these medications in the past.   Past Medical History  Diagnosis Date  . Cancer ([place])   . Multiple myeloma ([place])    History reviewed. No pertinent past surgical history. History reviewed. No pertinent family history. Social History  Substance Use Topics  . Smoking status: Never Smoker   . Smokeless tobacco: Never Used     Comment: NEVER USED TOBACCO  . Alcohol Use: 4.2 oz/week    7 Glasses of wine, 0 Standard drinks or equivalent per week    Review of Systems  Constitutional: Negative for fever, chills and appetite change.  HENT: Negative for mouth sores and sore throat.   Eyes: Negative for pain and itching.  Respiratory: Negative for cough and shortness of breath.   Cardiovascular: Negative for chest pain.  Gastrointestinal: Negative for abdominal pain and diarrhea.  Genitourinary: Negative for dysuria.  Musculoskeletal: Negative for myalgias,  arthralgias, neck pain and neck stiffness.  Skin: Positive for rash.  Neurological: Negative for weakness and headaches.    Allergies  Review of patient's allergies indicates no known allergies.  Home Medications   Prior to Admission medications   Medication Sig Start Date End Date Taking? Authorizing Provider  aspirin 325 MG tablet Take 325 mg by mouth daily.    Historical Provider, MD  [place] (CALCIUM/C/D) 500-10-250 MG-MG-UNIT CHEW Chew by mouth.    Historical Provider, MD  chlorhexidine (PERIDEX) 0.12 % solution Use as directed 15 mLs in the mouth or throat 2 (two) times daily.  04/17/15   Historical Provider, MD  clindamycin (CLEOCIN) 300 MG capsule Take by mouth.    Historical Provider, MD  lenalidomide (REVLIMID) 10 MG capsule Take 1 capsule daily for 21 days on and 7 days off. PFXT#[id number] 10/14/15   [name], MD  LORazepam (ATIVAN) 1 MG tablet Take 1 mg by mouth every 4 (four) hours as needed. 05/21/15   Historical Provider, MD  oxyCODONE (OXY IR/ROXICODONE) 5 MG immediate release tablet Take 5 mg by mouth every 4 (four) hours as needed. 05/21/15   Historical Provider, MD  prochlorperazine (COMPAZINE) 10 MG tablet Take 10 mg by mouth every 6 (six) hours as needed.    Historical Provider, MD  valACYclovir (VALTREX) 500 MG tablet Take 250 mg by mouth 2 (two) times daily. 05/20/15 05/19/16  Historical Provider, MD  Vitamin D, Ergocalciferol, (DRISDOL) 50000 units CAPS capsule  Take 50,000 Units by mouth once a week.    Historical Provider, MD   BP 120/77 mmHg  Pulse 65  Temp(Src) 98.7 F (37.1 C) (Oral)  Resp 18  Ht '6\' 1"'  (1.854 m)  Wt 95.255 kg  BMI 27.71 kg/m2  SpO2 100% Physical Exam  Constitutional: He is oriented to person, place, and time. He appears well-developed and well-nourished. No distress.  HENT:  Head: Normocephalic and atraumatic.  Mouth/Throat: Oropharynx is clear and moist. No oropharyngeal exudate.  Eyes: Conjunctivae and EOM are normal.  Pupils are equal, round, and reactive to light.  Neck: Normal range of motion. Neck supple.  Cardiovascular: Normal rate, regular rhythm, normal heart sounds and intact distal pulses.   No murmur heard. Pulmonary/Chest: Effort normal and breath sounds normal. No respiratory distress. He has no wheezes.  Abdominal: Soft. Bowel sounds are normal. There is no tenderness. There is no rebound and no guarding.  Musculoskeletal: Normal range of motion. He exhibits no edema or tenderness.  Lymphadenopathy:    He has no cervical adenopathy.  Neurological: He is alert and oriented to person, place, and time.  Skin: Skin is warm and dry. Rash noted.  Maculopapular rash of upper inner arms bilaterally, especially subaxillary region; lower back, buttocks and upper thighs circumferentially. Papular rash of LEs to back of leg bilaterally with petechial rash of inner ankles bilaterally.   Psychiatric: He has a normal mood and affect. His behavior is normal.    ED Course  Procedures (including critical care time) Labs Review Labs Reviewed - No data to display  Imaging Review No results found. I have personally reviewed and evaluated these images and lab results as part of my medical decision-making.   EKG Interpretation None      MDM   A dose of 0.3 mg of epinephrine and dose of 25 mg of atarax were given.   Final diagnoses:  None   Pt presents with persistent urticarial rash. He had improvement in rash with epinephrine, so suspect allergic dermatitis. Counseled patient to continue steroid taper. Provided prescription for atarax. Prescribed doxycycline for possible tick exposure with petechial rash at ankles (though he has no systemic signs of infection). Recommended follow-up with dermatology if rash does not resolve over the next week.   Olene Floss, MD [facility], PGY-2    [facility], MD 10/21/15 0165  Veryl Speak, MD 10/23/15 [phone]

## 2015-10-21 NOTE — ED Notes (Signed)
Rash and hives on bilateral arms and upper thighs x 10 days.  Pt has been treated with prednisone without relief.

## 2015-10-21 NOTE — ED Notes (Signed)
MD at bedside. 

## 2015-10-22 ENCOUNTER — Ambulatory Visit (HOSPITAL_BASED_OUTPATIENT_CLINIC_OR_DEPARTMENT_OTHER): Payer: BLUE CROSS/BLUE SHIELD

## 2015-10-22 ENCOUNTER — Ambulatory Visit (HOSPITAL_BASED_OUTPATIENT_CLINIC_OR_DEPARTMENT_OTHER): Payer: BLUE CROSS/BLUE SHIELD | Admitting: Hematology & Oncology

## 2015-10-22 ENCOUNTER — Encounter: Payer: Self-pay | Admitting: Hematology & Oncology

## 2015-10-22 ENCOUNTER — Other Ambulatory Visit (HOSPITAL_BASED_OUTPATIENT_CLINIC_OR_DEPARTMENT_OTHER): Payer: BLUE CROSS/BLUE SHIELD

## 2015-10-22 VITALS — BP 130/78 | HR 67 | Temp 97.9°F | Resp 16 | Ht 73.0 in | Wt 206.0 lb

## 2015-10-22 DIAGNOSIS — C9001 Multiple myeloma in remission: Secondary | ICD-10-CM

## 2015-10-22 DIAGNOSIS — C9000 Multiple myeloma not having achieved remission: Secondary | ICD-10-CM

## 2015-10-22 DIAGNOSIS — Z5112 Encounter for antineoplastic immunotherapy: Secondary | ICD-10-CM

## 2015-10-22 LAB — CBC WITH DIFFERENTIAL (CANCER CENTER ONLY)
BASO#: 0 10*3/uL (ref 0.0–0.2)
BASO%: 0.2 % (ref 0.0–2.0)
EOS%: 5.4 % (ref 0.0–7.0)
Eosinophils Absolute: 0.3 10*3/uL (ref 0.0–0.5)
HCT: 38.9 % (ref 38.7–49.9)
HGB: 13.8 g/dL (ref 13.0–17.1)
LYMPH#: 0.8 10*3/uL — ABNORMAL LOW (ref 0.9–3.3)
LYMPH%: 13 % — ABNORMAL LOW (ref 14.0–48.0)
MCH: 33.7 pg — ABNORMAL HIGH (ref 28.0–33.4)
MCHC: 35.5 g/dL (ref 32.0–35.9)
MCV: 95 fL (ref 82–98)
MONO#: 0.6 10*3/uL (ref 0.1–0.9)
MONO%: 10.9 % (ref 0.0–13.0)
NEUT#: 4.1 10*3/uL (ref 1.5–6.5)
NEUT%: 70.5 % (ref 40.0–80.0)
Platelets: 139 10*3/uL — ABNORMAL LOW (ref 145–400)
RBC: 4.1 10*6/uL — ABNORMAL LOW (ref 4.20–5.70)
RDW: 13.1 % (ref 11.1–15.7)
WBC: 5.8 10*3/uL (ref 4.0–10.0)

## 2015-10-22 LAB — CMP (CANCER CENTER ONLY)
ALT(SGPT): 93 U/L — ABNORMAL HIGH (ref 10–47)
AST: 46 U/L — ABNORMAL HIGH (ref 11–38)
Albumin: 3.8 g/dL (ref 3.3–5.5)
Alkaline Phosphatase: 47 U/L (ref 26–84)
BUN, Bld: 21 mg/dL (ref 7–22)
CO2: 25 mEq/L (ref 18–33)
Calcium: 9 mg/dL (ref 8.0–10.3)
Chloride: 105 mEq/L (ref 98–108)
Creat: 1.1 mg/dl (ref 0.6–1.2)
Glucose, Bld: 103 mg/dL (ref 73–118)
Potassium: 4.6 mEq/L (ref 3.3–4.7)
Sodium: 136 mEq/L (ref 128–145)
Total Bilirubin: 0.9 mg/dl (ref 0.20–1.60)
Total Protein: 6.6 g/dL (ref 6.4–8.1)

## 2015-10-22 MED ORDER — ONDANSETRON HCL 8 MG PO TABS: 8.0000 mg | ORAL_TABLET | Freq: Once | ORAL | Status: DC

## 2015-10-22 MED ORDER — BORTEZOMIB CHEMO SQ INJECTION 3.5 MG (2.5MG/ML)
1.3000 mg/m2 | Freq: Once | INTRAMUSCULAR | Status: AC
  Administered 2015-10-22: 2.75 mg via SUBCUTANEOUS
  Filled 2015-10-22: qty 2.75

## 2015-10-22 NOTE — Progress Notes (Signed)
Hematology and Oncology Follow Up Visit  Tyler Phillips [id number] 06-May-1965 50 y.o. 10/22/2015   Principle Diagnosis:   IgG Kappa myeloma  Current Therapy:  Status post autologous stem cell transplant on 05/22/2015   S/p Cycle #6 of RVD  Velcade q 2wk dosing/Revlimid 10mg  po q day (21/7) -      Interim History:  [name] is back for follow-up. He just got back from [place]. I then a house up on this sure. They really had a great time.  Unfortunately, he developed a rash. This rash was mostly localized to his upper inner thighs and inner aspect of both arms. He had nothing on his back. Nothing on his front torso. He has had a go to the emergency room because of the pruritus.  This rash is not blistered.  He is taking acyclovir.  He did have alcohol beverages. Did have some seafood. Everything was cooked.  He started the Revlimid last week. This was his second cycle. I told him to stop the Revlimid for right now.  His last myeloma studies in June did not show a monoclonal spike. His IgG level was 600 mg/dL. His Kappa Lightchain was 1.3 mg/dL.  He's had no change in bowel or bladder habits. He has had no nausea or vomiting. There's been no cough.  He has had no mouth sores. He's had no visual issues.    Currently, his performance status is ECOG 0   Medications:  Current outpatient prescriptions:  .  aspirin 325 MG tablet, Take 325 mg by mouth daily., Disp: , Rfl:  .  Calcium-Vitamins C & D (CALCIUM/C/D) 500-10-250 MG-MG-UNIT CHEW, Chew by mouth., Disp: , Rfl:  .  chlorhexidine (PERIDEX) 0.12 % solution, Use as directed 15 mLs in the mouth or throat 2 (two) times daily. , Disp: , Rfl: 0 .  clindamycin (CLEOCIN) 300 MG capsule, Take by mouth., Disp: , Rfl:  .  doxycycline (VIBRAMYCIN) 50 MG capsule, Take 2 capsules (100 mg total) by mouth 2 (two) times daily., Disp: 40 capsule, Rfl: 0 .  hydrOXYzine (ATARAX/VISTARIL) 25 MG tablet, Take 1 tablet (25 mg total) by mouth every  6 (six) hours as needed for itching., Disp: 30 tablet, Rfl: 0 .  lenalidomide (REVLIMID) 10 MG capsule, Take 1 capsule daily for 21 days on and 7 days off. [id number], Disp: 21 capsule, Rfl: 0 .  LORazepam (ATIVAN) 1 MG tablet, Take 1 mg by mouth every 4 (four) hours as needed., Disp: , Rfl:  .  oxyCODONE (OXY IR/ROXICODONE) 5 MG immediate release tablet, Take 5 mg by mouth every 4 (four) hours as needed., Disp: , Rfl:  .  penicillin v potassium (VEETID) 500 MG tablet, TAKE ONE TABLET (500 MG TOTAL) BY MOUTH EVERY 6 HOURS FOR 7 DAYS., Disp: , Rfl: 0 .  predniSONE (DELTASONE) 10 MG tablet, SEE ATTATCHED SHEET, Disp: , Rfl: 0 .  prochlorperazine (COMPAZINE) 10 MG tablet, Take 10 mg by mouth every 6 (six) hours as needed., Disp: , Rfl:  .  valACYclovir (VALTREX) 500 MG tablet, Take 250 mg by mouth 2 (two) times daily., Disp: , Rfl:  .  Vitamin D, Ergocalciferol, (DRISDOL) 50000 units CAPS capsule, Take 50,000 Units by mouth once a week., Disp: , Rfl:   Allergies: No Known Allergies  Past Medical History, Surgical history, Social history, and Family History were reviewed and updated.  Review of Systems: As above  Physical Exam:  height is 6\' 1"  (1.854 m) and weight is 206 lb (  93.441 kg). His oral temperature is 97.9 F (36.6 C). His blood pressure is 130/78 and his pulse is 67. His respiration is 16.   Wt Readings from Last 3 Encounters:  10/22/15 206 lb (93.441 kg)  10/21/15 210 lb (95.255 kg)  09/24/15 213 lb (96.616 kg)     Well-developed and well-nourished white male in no obvious distress. He has alopecia. Head and neck exam shows no ocular or oral lesions. There is some exposed mandibular bone in the left inner mandibular table. There are no palpable cervical or supraclavicular lymph nodes. Lungs are clear. Cardiac exam regular rate and rhythm with no murmurs, rubs or bruits. Abdomen is soft. He has good bowel sounds. There is no fluid wave. There is no palpable hepatomegaly. His  spleen tip is not palpable. Back exam shows no tenderness over the spine, ribs or hips. Extremities shows no clubbing, cyanosis or edema. He has good range of motion of his joints. He has good strength in his extremities. Has good pulses in his distal extremities.Marland Kitchen He may have some trace edema in his lower legs. Skin exam shows A macular rash on the thighs bilaterally. Of interest, this rash stops at the point of where he is having a tan line. He has this macular rash on the inner aspects of his upper arms bilaterally. Of note, this is where. There is not as much sun exposure.  Lab Results  Component Value Date   WBC 5.8 10/22/2015   HGB 13.8 10/22/2015   HCT 38.9 10/22/2015   MCV 95 10/22/2015   PLT 139* 10/22/2015     Chemistry      Component Value Date/Time   NA 136 10/22/2015 1021   NA 138 09/03/2015 1042   NA 138 07/09/2015 1423   NA 140 04/29/2015 0405   K 4.6 10/22/2015 1021   K 4.3 09/03/2015 1042   K 4.0 07/09/2015 1423   K 4.4 04/29/2015 0405   CL 105 10/22/2015 1021   CL 106 07/09/2015 1423   CL 107 04/29/2015 0405   CO2 25 10/22/2015 1021   CO2 25 09/03/2015 1042   CO2 24 07/09/2015 1423   CO2 28 04/29/2015 0405   BUN 21 10/22/2015 1021   BUN 21.7 09/03/2015 1042   BUN 17 07/09/2015 1423   BUN 16 04/29/2015 0405   CREATININE 1.1 10/22/2015 1021   CREATININE 1.2 09/03/2015 1042   CREATININE 1.25 07/09/2015 1423   CREATININE 1.34* 04/29/2015 0405      Component Value Date/Time   CALCIUM 9.0 10/22/2015 1021   CALCIUM 9.1 09/03/2015 1042   CALCIUM 8.9 07/09/2015 1423   CALCIUM 8.3* 04/29/2015 0405   ALKPHOS 47 10/22/2015 1021   ALKPHOS 42 09/03/2015 1042   ALKPHOS 44 07/09/2015 1423   ALKPHOS 42 04/29/2015 0405   AST 46* 10/22/2015 1021   AST 27 09/03/2015 1042   AST 25 07/09/2015 1423   AST 10* 04/29/2015 0405   ALT 93* 10/22/2015 1021   ALT 41 09/03/2015 1042   ALT 36 07/09/2015 1423   ALT 19 04/29/2015 0405   BILITOT 0.90 10/22/2015 1021   BILITOT  0.54 09/03/2015 1042   BILITOT 0.2 07/09/2015 1423   BILITOT 0.9 04/29/2015 0405         Impression and Plan: [name] is 50 year old gentleman with IgG Kappa myeloma. [facility] got him to transplant. This was truly a process with him. It took almost a year to get him to transplant.  Of note, his pre-transplant bone  marrow was done at [facility]. It showed 10% plasma cells.  He had flow cytometry which showed an atypical monoclonal cell population. His cytogenetics did not show any obvious chromosomal abnormalities. His FISH showed a t(14:16), +4, and +17p   I'm not sure is what is causing this rash. I would not think that would be sunscreen. However, this clearly is in an area in which he has not gotten as much sun.  I still have to worry about Revlimid as a culprit. I will have him stop the red blood for right now. His LT's are slightly elevated. We will have to follow the LFTs.  I want to see him back in 2 weeks. Hope, the rash will be better. If so, then I would restart the Revlimid. I told him to take over-the-counter Pepcid. I told him to take 40 mg twice a day.   about 30 minutes with he and his wife.    [name], MD 7/5/201711:30 AM

## 2015-10-22 NOTE — Patient Instructions (Signed)
Bortezomib injection What is this medicine? BORTEZOMIB (bor TEZ oh mib) is a medicine that targets proteins in cancer cells and stops the cancer cells from growing. It is used to treat multiple myeloma and mantle-cell lymphoma. This medicine may be used for other purposes; ask your health care provider or pharmacist if you have questions. What should I tell my health care provider before I take this medicine? They need to know if you have any of these conditions: -diabetes -heart disease -irregular heartbeat -liver disease -on hemodialysis -low blood counts, like low white blood cells, platelets, or hemoglobin -peripheral neuropathy -taking medicine for blood pressure -an unusual or allergic reaction to bortezomib, mannitol, boron, other medicines, foods, dyes, or preservatives -pregnant or trying to get pregnant -breast-feeding How should I use this medicine? This medicine is for injection into a vein or for injection under the skin. It is given by a health care professional in a hospital or clinic setting. Talk to your pediatrician regarding the use of this medicine in children. Special care may be needed. Overdosage: If you think you have taken too much of this medicine contact a poison control center or emergency room at once. NOTE: This medicine is only for you. Do not share this medicine with others. What if I miss a dose? It is important not to miss your dose. Call your doctor or health care professional if you are unable to keep an appointment. What may interact with this medicine? This medicine may interact with the following medications: -ketoconazole -rifampin -ritonavir -St. John's Wort This list may not describe all possible interactions. Give your health care provider a list of all the medicines, herbs, non-prescription drugs, or dietary supplements you use. Also tell them if you smoke, drink alcohol, or use illegal drugs. Some items may interact with your medicine. What  should I watch for while using this medicine? Visit your doctor for checks on your progress. This drug may make you feel generally unwell. This is not uncommon, as chemotherapy can affect healthy cells as well as cancer cells. Report any side effects. Continue your course of treatment even though you feel ill unless your doctor tells you to stop. You may get drowsy or dizzy. Do not drive, use machinery, or do anything that needs mental alertness until you know how this medicine affects you. Do not stand or sit up quickly, especially if you are an older patient. This reduces the risk of dizzy or fainting spells. In some cases, you may be given additional medicines to help with side effects. Follow all directions for their use. Call your doctor or health care professional for advice if you get a fever, chills or sore throat, or other symptoms of a cold or flu. Do not treat yourself. This drug decreases your body's ability to fight infections. Try to avoid being around people who are sick. This medicine may increase your risk to bruise or bleed. Call your doctor or health care professional if you notice any unusual bleeding. You may need blood work done while you are taking this medicine. In some patients, this medicine may cause a serious brain infection that may cause death. If you have any problems seeing, thinking, speaking, walking, or standing, tell your doctor right away. If you cannot reach your doctor, urgently seek other source of medical care. Do not become pregnant while taking this medicine. Women should inform their doctor if they wish to become pregnant or think they might be pregnant. There is a potential for serious  side effects to an unborn child. Talk to your health care professional or pharmacist for more information. Do not breast-feed an infant while taking this medicine. Check with your doctor or health care professional if you get an attack of severe diarrhea, nausea and vomiting, or if  you sweat a lot. The loss of too much body fluid can make it dangerous for you to take this medicine. What side effects may I notice from receiving this medicine? Side effects that you should report to your doctor or health care professional as soon as possible: -allergic reactions like skin rash, itching or hives, swelling of the face, lips, or tongue -breathing problems -changes in hearing -changes in vision -fast, irregular heartbeat -feeling faint or lightheaded, falls -pain, tingling, numbness in the hands or feet -right upper belly pain -seizures -swelling of the ankles, feet, hands -unusual bleeding or bruising -unusually weak or tired -vomiting -yellowing of the eyes or skin Side effects that usually do not require medical attention (report to your doctor or health care professional if they continue or are bothersome): -changes in emotions or moods -constipation -diarrhea -loss of appetite -headache -irritation at site where injected -nausea This list may not describe all possible side effects. Call your doctor for medical advice about side effects. You may report side effects to FDA at 1-800-FDA-1088. Where should I keep my medicine? This drug is given in a hospital or clinic and will not be stored at home. NOTE: This sheet is a summary. It may not cover all possible information. If you have questions about this medicine, talk to your doctor, pharmacist, or health care provider.    2016, [employer] Standard. (2014-06-04 14:47:04)

## 2015-10-23 LAB — KAPPA/LAMBDA LIGHT CHAINS
Ig Kappa Free Light Chain: 11.4 mg/L (ref 3.3–19.4)
Ig Lambda Free Light Chain: 11.8 mg/L (ref 5.7–26.3)
Kappa/Lambda FluidC Ratio: 0.97 (ref 0.26–1.65)

## 2015-10-24 LAB — MULTIPLE MYELOMA PANEL, SERUM
Albumin SerPl Elph-Mcnc: 4.2 g/dL (ref 2.9–4.4)
Albumin/Glob SerPl: 1.8 — ABNORMAL HIGH (ref 0.7–1.7)
Alpha 1: 0.2 g/dL (ref 0.0–0.4)
Alpha2 Glob SerPl Elph-Mcnc: 0.7 g/dL (ref 0.4–1.0)
B-Globulin SerPl Elph-Mcnc: 0.9 g/dL (ref 0.7–1.3)
Gamma Glob SerPl Elph-Mcnc: 0.7 g/dL (ref 0.4–1.8)
Globulin, Total: 2.4 g/dL (ref 2.2–3.9)
IgA, Qn, Serum: 31 mg/dL — ABNORMAL LOW (ref 90–386)
IgG, Qn, Serum: 661 mg/dL — ABNORMAL LOW (ref 700–1600)
IgM, Qn, Serum: 26 mg/dL (ref 20–172)
Total Protein: 6.6 g/dL (ref 6.0–8.5)

## 2015-11-04 ENCOUNTER — Ambulatory Visit: Payer: BLUE CROSS/BLUE SHIELD | Admitting: Hematology & Oncology

## 2015-11-05 ENCOUNTER — Other Ambulatory Visit: Payer: BLUE CROSS/BLUE SHIELD

## 2015-11-05 ENCOUNTER — Ambulatory Visit (HOSPITAL_BASED_OUTPATIENT_CLINIC_OR_DEPARTMENT_OTHER): Payer: BLUE CROSS/BLUE SHIELD | Admitting: Hematology & Oncology

## 2015-11-05 ENCOUNTER — Ambulatory Visit: Payer: BLUE CROSS/BLUE SHIELD

## 2015-11-05 ENCOUNTER — Other Ambulatory Visit (HOSPITAL_BASED_OUTPATIENT_CLINIC_OR_DEPARTMENT_OTHER): Payer: BLUE CROSS/BLUE SHIELD

## 2015-11-05 ENCOUNTER — Encounter: Payer: Self-pay | Admitting: Hematology & Oncology

## 2015-11-05 ENCOUNTER — Ambulatory Visit (HOSPITAL_BASED_OUTPATIENT_CLINIC_OR_DEPARTMENT_OTHER): Payer: BLUE CROSS/BLUE SHIELD

## 2015-11-05 VITALS — BP 122/71 | HR 56 | Temp 98.2°F | Resp 18 | Ht 73.0 in | Wt 209.0 lb

## 2015-11-05 DIAGNOSIS — C9001 Multiple myeloma in remission: Secondary | ICD-10-CM

## 2015-11-05 DIAGNOSIS — C9000 Multiple myeloma not having achieved remission: Secondary | ICD-10-CM | POA: Diagnosis not present

## 2015-11-05 DIAGNOSIS — Z5112 Encounter for antineoplastic immunotherapy: Secondary | ICD-10-CM | POA: Diagnosis not present

## 2015-11-05 LAB — CBC WITH DIFFERENTIAL (CANCER CENTER ONLY)
BASO#: 0 10*3/uL (ref 0.0–0.2)
BASO%: 0.8 % (ref 0.0–2.0)
EOS%: 2.8 % (ref 0.0–7.0)
Eosinophils Absolute: 0.1 10*3/uL (ref 0.0–0.5)
HCT: 38.9 % (ref 38.7–49.9)
HGB: 13.9 g/dL (ref 13.0–17.1)
LYMPH#: 1 10*3/uL (ref 0.9–3.3)
LYMPH%: 20.6 % (ref 14.0–48.0)
MCH: 34.2 pg — ABNORMAL HIGH (ref 28.0–33.4)
MCHC: 35.7 g/dL (ref 32.0–35.9)
MCV: 96 fL (ref 82–98)
MONO#: 0.4 10*3/uL (ref 0.1–0.9)
MONO%: 7.7 % (ref 0.0–13.0)
NEUT#: 3.4 10*3/uL (ref 1.5–6.5)
NEUT%: 68.1 % (ref 40.0–80.0)
Platelets: 158 10*3/uL (ref 145–400)
RBC: 4.07 10*6/uL — ABNORMAL LOW (ref 4.20–5.70)
RDW: 13.4 % (ref 11.1–15.7)
WBC: 5 10*3/uL (ref 4.0–10.0)

## 2015-11-05 LAB — CMP (CANCER CENTER ONLY)
ALT(SGPT): 61 U/L — ABNORMAL HIGH (ref 10–47)
AST: 34 U/L (ref 11–38)
Albumin: 3.8 g/dL (ref 3.3–5.5)
Alkaline Phosphatase: 47 U/L (ref 26–84)
BUN, Bld: 19 mg/dL (ref 7–22)
CO2: 27 mEq/L (ref 18–33)
Calcium: 9.2 mg/dL (ref 8.0–10.3)
Chloride: 105 mEq/L (ref 98–108)
Creat: 1.2 mg/dl (ref 0.6–1.2)
Glucose, Bld: 91 mg/dL (ref 73–118)
Potassium: 4.2 mEq/L (ref 3.3–4.7)
Sodium: 139 mEq/L (ref 128–145)
Total Bilirubin: 0.9 mg/dl (ref 0.20–1.60)
Total Protein: 6.5 g/dL (ref 6.4–8.1)

## 2015-11-05 LAB — LACTATE DEHYDROGENASE: LDH: 191 U/L (ref 125–245)

## 2015-11-05 MED ORDER — BORTEZOMIB CHEMO SQ INJECTION 3.5 MG (2.5MG/ML)
1.3000 mg/m2 | Freq: Once | INTRAMUSCULAR | Status: AC
  Administered 2015-11-05: 2.75 mg via SUBCUTANEOUS
  Filled 2015-11-05: qty 1.1

## 2015-11-05 MED ORDER — ONDANSETRON HCL 8 MG PO TABS: 8.0000 mg | ORAL_TABLET | Freq: Once | ORAL | Status: DC

## 2015-11-05 NOTE — Patient Instructions (Signed)
Bortezomib injection What is this medicine? BORTEZOMIB (bor TEZ oh mib) is a medicine that targets proteins in cancer cells and stops the cancer cells from growing. It is used to treat multiple myeloma and mantle-cell lymphoma. This medicine may be used for other purposes; ask your health care provider or pharmacist if you have questions. What should I tell my health care provider before I take this medicine? They need to know if you have any of these conditions: -diabetes -heart disease -irregular heartbeat -liver disease -on hemodialysis -low blood counts, like low white blood cells, platelets, or hemoglobin -peripheral neuropathy -taking medicine for blood pressure -an unusual or allergic reaction to bortezomib, mannitol, boron, other medicines, foods, dyes, or preservatives -pregnant or trying to get pregnant -breast-feeding How should I use this medicine? This medicine is for injection into a vein or for injection under the skin. It is given by a health care professional in a hospital or clinic setting. Talk to your pediatrician regarding the use of this medicine in children. Special care may be needed. Overdosage: If you think you have taken too much of this medicine contact a poison control center or emergency room at once. NOTE: This medicine is only for you. Do not share this medicine with others. What if I miss a dose? It is important not to miss your dose. Call your doctor or health care professional if you are unable to keep an appointment. What may interact with this medicine? This medicine may interact with the following medications: -ketoconazole -rifampin -ritonavir -St. John's Wort This list may not describe all possible interactions. Give your health care provider a list of all the medicines, herbs, non-prescription drugs, or dietary supplements you use. Also tell them if you smoke, drink alcohol, or use illegal drugs. Some items may interact with your medicine. What  should I watch for while using this medicine? Visit your doctor for checks on your progress. This drug may make you feel generally unwell. This is not uncommon, as chemotherapy can affect healthy cells as well as cancer cells. Report any side effects. Continue your course of treatment even though you feel ill unless your doctor tells you to stop. You may get drowsy or dizzy. Do not drive, use machinery, or do anything that needs mental alertness until you know how this medicine affects you. Do not stand or sit up quickly, especially if you are an older patient. This reduces the risk of dizzy or fainting spells. In some cases, you may be given additional medicines to help with side effects. Follow all directions for their use. Call your doctor or health care professional for advice if you get a fever, chills or sore throat, or other symptoms of a cold or flu. Do not treat yourself. This drug decreases your body's ability to fight infections. Try to avoid being around people who are sick. This medicine may increase your risk to bruise or bleed. Call your doctor or health care professional if you notice any unusual bleeding. You may need blood work done while you are taking this medicine. In some patients, this medicine may cause a serious brain infection that may cause death. If you have any problems seeing, thinking, speaking, walking, or standing, tell your doctor right away. If you cannot reach your doctor, urgently seek other source of medical care. Do not become pregnant while taking this medicine. Women should inform their doctor if they wish to become pregnant or think they might be pregnant. There is a potential for serious  side effects to an unborn child. Talk to your health care professional or pharmacist for more information. Do not breast-feed an infant while taking this medicine. Check with your doctor or health care professional if you get an attack of severe diarrhea, nausea and vomiting, or if  you sweat a lot. The loss of too much body fluid can make it dangerous for you to take this medicine. What side effects may I notice from receiving this medicine? Side effects that you should report to your doctor or health care professional as soon as possible: -allergic reactions like skin rash, itching or hives, swelling of the face, lips, or tongue -breathing problems -changes in hearing -changes in vision -fast, irregular heartbeat -feeling faint or lightheaded, falls -pain, tingling, numbness in the hands or feet -right upper belly pain -seizures -swelling of the ankles, feet, hands -unusual bleeding or bruising -unusually weak or tired -vomiting -yellowing of the eyes or skin Side effects that usually do not require medical attention (report to your doctor or health care professional if they continue or are bothersome): -changes in emotions or moods -constipation -diarrhea -loss of appetite -headache -irritation at site where injected -nausea This list may not describe all possible side effects. Call your doctor for medical advice about side effects. You may report side effects to FDA at 1-800-FDA-1088. Where should I keep my medicine? This drug is given in a hospital or clinic and will not be stored at home. NOTE: This sheet is a summary. It may not cover all possible information. If you have questions about this medicine, talk to your doctor, pharmacist, or health care provider.    2016, [employer] Standard. (2014-06-04 14:47:04)

## 2015-11-05 NOTE — Progress Notes (Signed)
Hematology and Oncology Follow Up Visit  Tyler Phillips [id number] [birth year] 50 y.o. 11/05/2015   Principle Diagnosis:   IgG Kappa myeloma  Current Therapy:  Status post autologous stem cell transplant on 05/22/2015   S/p Cycle #6 of RVD  Velcade q 2wk dosing/Revlimid 10mg  po q day (21/7) -      Interim History:  [name] is back for follow-up. The rash got better. He took some Benadryl. I think we gave him some Pepcid. He restarted the Revlimid yesterday. We will see how he does.  He started the Revlimid last week. This was his second cycle. I told him to stop the Revlimid for right now.  His last myeloma studies in June did not show a monoclonal spike. His IgG level was 661 mg/dL. His Kappa Light chain was 1.14 mg/dL.  As always, he's been very active. He is outside quite a bit. He just does a lot of things outside. 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 I think this might be very interesting. I'm sure he will succeed at this.  He's had no change in bowel or bladder habits. He has had no nausea or vomiting. There's been no cough.  He has had no mouth sores. He's had no visual issues.    Currently, his performance status is ECOG 0   Medications:  Current outpatient prescriptions:  .  aspirin 325 MG tablet, Take 325 mg by mouth daily., Disp: , Rfl:  .  Calcium-Vitamins C & D (CALCIUM/C/D) 500-10-250 MG-MG-UNIT CHEW, Chew by mouth., Disp: , Rfl:  .  chlorhexidine (PERIDEX) 0.12 % solution, Use as directed 15 mLs in the mouth or throat 2 (two) times daily. , Disp: , Rfl: 0 .  clindamycin (CLEOCIN) 300 MG capsule, Take by mouth., Disp: , Rfl:  .  doxycycline (VIBRAMYCIN) 50 MG capsule, Take 2 capsules (100 mg total) by mouth 2 (two) times daily., Disp: 40 capsule, Rfl: 0 .  hydrOXYzine (ATARAX/VISTARIL) 25 MG tablet, Take 1 tablet (25 mg total) by mouth every 6 (six) hours as needed for itching., Disp: 30 tablet, Rfl: 0 .  lenalidomide (REVLIMID) 10 MG capsule, Take 1  capsule daily for 21 days on and 7 days off. [id number], Disp: 21 capsule, Rfl: 0 .  LORazepam (ATIVAN) 1 MG tablet, Take 1 mg by mouth every 4 (four) hours as needed., Disp: , Rfl:  .  oxyCODONE (OXY IR/ROXICODONE) 5 MG immediate release tablet, Take 5 mg by mouth every 4 (four) hours as needed., Disp: , Rfl:  .  penicillin v potassium (VEETID) 500 MG tablet, TAKE ONE TABLET (500 MG TOTAL) BY MOUTH EVERY 6 HOURS FOR 7 DAYS., Disp: , Rfl: 0 .  predniSONE (DELTASONE) 10 MG tablet, SEE ATTATCHED SHEET, Disp: , Rfl: 0 .  prochlorperazine (COMPAZINE) 10 MG tablet, Take 10 mg by mouth every 6 (six) hours as needed., Disp: , Rfl:  .  valACYclovir (VALTREX) 500 MG tablet, Take 250 mg by mouth 2 (two) times daily., Disp: , Rfl:  .  Vitamin D, Ergocalciferol, (DRISDOL) 50000 units CAPS capsule, Take 50,000 Units by mouth once a week., Disp: , Rfl:   Allergies: No Known Allergies  Past Medical History, Surgical history, Social history, and Family History were reviewed and updated.  Review of Systems: As above  Physical Exam:  height is 6\' 1"  (1.854 m) and weight is 209 lb (94.802 kg). His oral temperature is 98.2 F (36.8 C). His blood pressure is 122/71 and his pulse is 56.  His respiration is 18.   Wt Readings from Last 3 Encounters:  11/05/15 209 lb (94.802 kg)  10/22/15 206 lb (93.441 kg)  10/21/15 210 lb (95.255 kg)     Well-developed and well-nourished white male in no obvious distress. He has alopecia. Head and neck exam shows no ocular or oral lesions. There is some exposed mandibular bone in the left inner mandibular table. There are no palpable cervical or supraclavicular lymph nodes. Lungs are clear. Cardiac exam regular rate and rhythm with no murmurs, rubs or bruits. Abdomen is soft. He has good bowel sounds. There is no fluid wave. There is no palpable hepatomegaly. His spleen tip is not palpable. Back exam shows no tenderness over the spine, ribs or hips. Extremities shows no  clubbing, cyanosis or edema. He has good range of motion of his joints. He has good strength in his extremities. Has good pulses in his distal extremities.Marland Kitchen He may have some trace edema in his lower legs. Skin exam shows no rashes, ecchymoses or petechia.  Lab Results  Component Value Date   WBC 5.0 11/05/2015   HGB 13.9 11/05/2015   HCT 38.9 11/05/2015   MCV 96 11/05/2015   PLT 158 11/05/2015     Chemistry      Component Value Date/Time   NA 139 11/05/2015 0944   NA 138 09/03/2015 1042   NA 138 07/09/2015 1423   NA 140 04/29/2015 0405   K 4.2 11/05/2015 0944   K 4.3 09/03/2015 1042   K 4.0 07/09/2015 1423   K 4.4 04/29/2015 0405   CL 105 11/05/2015 0944   CL 106 07/09/2015 1423   CL 107 04/29/2015 0405   CO2 27 11/05/2015 0944   CO2 25 09/03/2015 1042   CO2 24 07/09/2015 1423   CO2 28 04/29/2015 0405   BUN 19 11/05/2015 0944   BUN 21.7 09/03/2015 1042   BUN 17 07/09/2015 1423   BUN 16 04/29/2015 0405   CREATININE 1.2 11/05/2015 0944   CREATININE 1.2 09/03/2015 1042   CREATININE 1.25 07/09/2015 1423   CREATININE 1.34* 04/29/2015 0405      Component Value Date/Time   CALCIUM 9.2 11/05/2015 0944   CALCIUM 9.1 09/03/2015 1042   CALCIUM 8.9 07/09/2015 1423   CALCIUM 8.3* 04/29/2015 0405   ALKPHOS 47 11/05/2015 0944   ALKPHOS 42 09/03/2015 1042   ALKPHOS 44 07/09/2015 1423   ALKPHOS 42 04/29/2015 0405   AST 34 11/05/2015 0944   AST 27 09/03/2015 1042   AST 25 07/09/2015 1423   AST 10* 04/29/2015 0405   ALT 61* 11/05/2015 0944   ALT 41 09/03/2015 1042   ALT 36 07/09/2015 1423   ALT 19 04/29/2015 0405   BILITOT 0.90 11/05/2015 0944   BILITOT 0.54 09/03/2015 1042   BILITOT 0.2 07/09/2015 1423   BILITOT 0.9 04/29/2015 0405         Impression and Plan: [name] is 50 year old gentleman with IgG Kappa myeloma. [facility] got him to transplant. This was truly a process with him. It took almost a year to get him to transplant.  Of note, his pre-transplant bone  marrow was done at [facility]. It showed 10% plasma cells.  He had flow cytometry which showed an atypical monoclonal cell population. His cytogenetics did not show any obvious chromosomal abnormalities. His FISH showed a t(14:16), +4, and +17p  I'm glad of the rash got better. He will restart the Revlimid. Hopefully, this will not be a problem for him. He is taking  Benadryl beforehand.  He has plans for vacation in August. He'll be going up to the beach in [place] and then out to the [place] in [place]. We will certainly be able to accommodate him and adjust his schedule accordingly.  I will plan to get him back in another couple weeks.   [name], MD 7/19/201710:56 AM

## 2015-11-10 ENCOUNTER — Telehealth: Payer: Self-pay | Admitting: *Deleted

## 2015-11-10 NOTE — Telephone Encounter (Signed)
Patient asked if ok to postpone one week of velcade for 1 week.  [place] with Dr. [name].  Scheduler notified.

## 2015-11-11 ENCOUNTER — Other Ambulatory Visit: Payer: BLUE CROSS/BLUE SHIELD

## 2015-11-19 ENCOUNTER — Ambulatory Visit: Payer: BLUE CROSS/BLUE SHIELD | Admitting: Hematology & Oncology

## 2015-11-19 ENCOUNTER — Ambulatory Visit: Payer: BLUE CROSS/BLUE SHIELD

## 2015-11-19 ENCOUNTER — Other Ambulatory Visit: Payer: Self-pay | Admitting: *Deleted

## 2015-11-19 ENCOUNTER — Other Ambulatory Visit: Payer: BLUE CROSS/BLUE SHIELD

## 2015-11-19 DIAGNOSIS — C9002 Multiple myeloma in relapse: Secondary | ICD-10-CM

## 2015-11-19 MED ORDER — LENALIDOMIDE 10 MG PO CAPS: ORAL_CAPSULE | ORAL | 0 refills | Status: DC

## 2015-11-27 ENCOUNTER — Other Ambulatory Visit (HOSPITAL_BASED_OUTPATIENT_CLINIC_OR_DEPARTMENT_OTHER): Payer: BLUE CROSS/BLUE SHIELD

## 2015-11-27 ENCOUNTER — Ambulatory Visit (HOSPITAL_BASED_OUTPATIENT_CLINIC_OR_DEPARTMENT_OTHER): Payer: BLUE CROSS/BLUE SHIELD | Admitting: Hematology & Oncology

## 2015-11-27 ENCOUNTER — Encounter: Payer: Self-pay | Admitting: Hematology & Oncology

## 2015-11-27 ENCOUNTER — Ambulatory Visit (HOSPITAL_BASED_OUTPATIENT_CLINIC_OR_DEPARTMENT_OTHER): Payer: BLUE CROSS/BLUE SHIELD

## 2015-11-27 DIAGNOSIS — C9000 Multiple myeloma not having achieved remission: Secondary | ICD-10-CM | POA: Diagnosis not present

## 2015-11-27 DIAGNOSIS — C9001 Multiple myeloma in remission: Secondary | ICD-10-CM

## 2015-11-27 DIAGNOSIS — Z5112 Encounter for antineoplastic immunotherapy: Secondary | ICD-10-CM | POA: Diagnosis not present

## 2015-11-27 LAB — CMP (CANCER CENTER ONLY)
ALT(SGPT): 56 U/L — ABNORMAL HIGH (ref 10–47)
AST: 31 U/L (ref 11–38)
Albumin: 3.7 g/dL (ref 3.3–5.5)
Alkaline Phosphatase: 56 U/L (ref 26–84)
BUN, Bld: 13 mg/dL (ref 7–22)
CO2: 23 mEq/L (ref 18–33)
Calcium: 8.7 mg/dL (ref 8.0–10.3)
Chloride: 108 mEq/L (ref 98–108)
Creat: 1.3 mg/dl — ABNORMAL HIGH (ref 0.6–1.2)
Glucose, Bld: 86 mg/dL (ref 73–118)
Potassium: 4.6 mEq/L (ref 3.3–4.7)
Sodium: 136 mEq/L (ref 128–145)
Total Bilirubin: 1 mg/dl (ref 0.20–1.60)
Total Protein: 6.4 g/dL (ref 6.4–8.1)

## 2015-11-27 LAB — CBC WITH DIFFERENTIAL (CANCER CENTER ONLY)
BASO#: 0 10*3/uL (ref 0.0–0.2)
BASO%: 0.3 % (ref 0.0–2.0)
EOS%: 3.6 % (ref 0.0–7.0)
Eosinophils Absolute: 0.1 10*3/uL (ref 0.0–0.5)
HCT: 36.2 % — ABNORMAL LOW (ref 38.7–49.9)
HGB: 13.2 g/dL (ref 13.0–17.1)
LYMPH#: 0.7 10*3/uL — ABNORMAL LOW (ref 0.9–3.3)
LYMPH%: 22 % (ref 14.0–48.0)
MCH: 34.6 pg — ABNORMAL HIGH (ref 28.0–33.4)
MCHC: 36.5 g/dL — ABNORMAL HIGH (ref 32.0–35.9)
MCV: 95 fL (ref 82–98)
MONO#: 0.4 10*3/uL (ref 0.1–0.9)
MONO%: 13.3 % — ABNORMAL HIGH (ref 0.0–13.0)
NEUT#: 2 10*3/uL (ref 1.5–6.5)
NEUT%: 60.8 % (ref 40.0–80.0)
Platelets: 154 10*3/uL (ref 145–400)
RBC: 3.82 10*6/uL — ABNORMAL LOW (ref 4.20–5.70)
RDW: 14 % (ref 11.1–15.7)
WBC: 3.3 10*3/uL — ABNORMAL LOW (ref 4.0–10.0)

## 2015-11-27 MED ORDER — BORTEZOMIB CHEMO SQ INJECTION 3.5 MG (2.5MG/ML)
1.3000 mg/m2 | Freq: Once | INTRAMUSCULAR | Status: AC
  Administered 2015-11-27: 2.75 mg via SUBCUTANEOUS
  Filled 2015-11-27: qty 2.75

## 2015-11-27 NOTE — Patient Instructions (Signed)
Bortezomib injection What is this medicine? BORTEZOMIB (bor TEZ oh mib) is a medicine that targets proteins in cancer cells and stops the cancer cells from growing. It is used to treat multiple myeloma and mantle-cell lymphoma. This medicine may be used for other purposes; ask your health care provider or pharmacist if you have questions. What should I tell my health care provider before I take this medicine? They need to know if you have any of these conditions: -diabetes -heart disease -irregular heartbeat -liver disease -on hemodialysis -low blood counts, like low white blood cells, platelets, or hemoglobin -peripheral neuropathy -taking medicine for blood pressure -an unusual or allergic reaction to bortezomib, mannitol, boron, other medicines, foods, dyes, or preservatives -pregnant or trying to get pregnant -breast-feeding How should I use this medicine? This medicine is for injection into a vein or for injection under the skin. It is given by a health care professional in a hospital or clinic setting. Talk to your pediatrician regarding the use of this medicine in children. Special care may be needed. Overdosage: If you think you have taken too much of this medicine contact a poison control center or emergency room at once. NOTE: This medicine is only for you. Do not share this medicine with others. What if I miss a dose? It is important not to miss your dose. Call your doctor or health care professional if you are unable to keep an appointment. What may interact with this medicine? This medicine may interact with the following medications: -ketoconazole -rifampin -ritonavir -St. John's Wort This list may not describe all possible interactions. Give your health care provider a list of all the medicines, herbs, non-prescription drugs, or dietary supplements you use. Also tell them if you smoke, drink alcohol, or use illegal drugs. Some items may interact with your medicine. What  should I watch for while using this medicine? Visit your doctor for checks on your progress. This drug may make you feel generally unwell. This is not uncommon, as chemotherapy can affect healthy cells as well as cancer cells. Report any side effects. Continue your course of treatment even though you feel ill unless your doctor tells you to stop. You may get drowsy or dizzy. Do not drive, use machinery, or do anything that needs mental alertness until you know how this medicine affects you. Do not stand or sit up quickly, especially if you are an older patient. This reduces the risk of dizzy or fainting spells. In some cases, you may be given additional medicines to help with side effects. Follow all directions for their use. Call your doctor or health care professional for advice if you get a fever, chills or sore throat, or other symptoms of a cold or flu. Do not treat yourself. This drug decreases your body's ability to fight infections. Try to avoid being around people who are sick. This medicine may increase your risk to bruise or bleed. Call your doctor or health care professional if you notice any unusual bleeding. You may need blood work done while you are taking this medicine. In some patients, this medicine may cause a serious brain infection that may cause death. If you have any problems seeing, thinking, speaking, walking, or standing, tell your doctor right away. If you cannot reach your doctor, urgently seek other source of medical care. Do not become pregnant while taking this medicine. Women should inform their doctor if they wish to become pregnant or think they might be pregnant. There is a potential for serious  side effects to an unborn child. Talk to your health care professional or pharmacist for more information. Do not breast-feed an infant while taking this medicine. Check with your doctor or health care professional if you get an attack of severe diarrhea, nausea and vomiting, or if  you sweat a lot. The loss of too much body fluid can make it dangerous for you to take this medicine. What side effects may I notice from receiving this medicine? Side effects that you should report to your doctor or health care professional as soon as possible: -allergic reactions like skin rash, itching or hives, swelling of the face, lips, or tongue -breathing problems -changes in hearing -changes in vision -fast, irregular heartbeat -feeling faint or lightheaded, falls -pain, tingling, numbness in the hands or feet -right upper belly pain -seizures -swelling of the ankles, feet, hands -unusual bleeding or bruising -unusually weak or tired -vomiting -yellowing of the eyes or skin Side effects that usually do not require medical attention (report to your doctor or health care professional if they continue or are bothersome): -changes in emotions or moods -constipation -diarrhea -loss of appetite -headache -irritation at site where injected -nausea This list may not describe all possible side effects. Call your doctor for medical advice about side effects. You may report side effects to FDA at 1-800-FDA-1088. Where should I keep my medicine? This drug is given in a hospital or clinic and will not be stored at home. NOTE: This sheet is a summary. It may not cover all possible information. If you have questions about this medicine, talk to your doctor, pharmacist, or health care provider.    2016, [employer] Standard. (2014-06-04 14:47:04)

## 2015-11-27 NOTE — Progress Notes (Signed)
Hematology and Oncology Follow Up Visit  Tyler Phillips [id number] [birth year] 50 y.o. 11/27/2015   Principle Diagnosis:   IgG Kappa myeloma  Current Therapy:  Status post autologous stem cell transplant on 05/22/2015   S/p Cycle #6 of RVD  Velcade q 2wk dosing/Revlimid 10mg  po q day (21/7) -      Interim History:  [name] is back for follow-up. He was just seen at [facility]. He says that they are very pleased with how well he is doing.  He just got back from another trip up to [place]. [place]. He really had a good time. We had back up there sometime in the fall.  He and his family will be had now to [place] soon. They will be going to [facility].  So far, his myeloma studies have looked great. There is no monoclonal spike in his serum. His IgG level is 661 mg/dL. His Kappa Lightchain is 1.14 mg/dL.  He is thinking about going back to work. I do not have a problems with him doing this. I told he may want to try part-time at first and then build up to full time.  He is still trying to exercise. He is keeping himself in great shape.  He's had no problems with cough or shortness of breath. He's had no rashes. He takes Zyrtec before he takes his Revlimid. This is helped quite a bit.  He has had no fever. He's had no mouth sores. There's been no change in bowel or bladder habits.    Currently, his performance status is ECOG 0   Medications:  Current Outpatient Prescriptions:  .  aspirin 325 MG tablet, Take 325 mg by mouth daily., Disp: , Rfl:  .  Calcium-Vitamins C & D (CALCIUM/C/D) 500-10-250 MG-MG-UNIT CHEW, Chew by mouth., Disp: , Rfl:  .  cetirizine (ZYRTEC) 10 MG tablet, Take 10 mg by mouth daily., Disp: , Rfl:  .  chlorhexidine (PERIDEX) 0.12 % solution, Use as directed 15 mLs in the mouth or throat 2 (two) times daily. , Disp: , Rfl: 0 .  clindamycin (CLEOCIN) 300 MG capsule, Take by mouth., Disp: , Rfl:  .  hydrOXYzine (ATARAX/VISTARIL) 25 MG  tablet, Take 1 tablet (25 mg total) by mouth every 6 (six) hours as needed for itching., Disp: 30 tablet, Rfl: 0 .  lenalidomide (REVLIMID) 10 MG capsule, Take 1 capsule daily for 21 days on and 7 days off. Auth# [id number], Disp: 21 capsule, Rfl: 0 .  LORazepam (ATIVAN) 1 MG tablet, Take 1 mg by mouth every 4 (four) hours as needed., Disp: , Rfl:  .  oxyCODONE (OXY IR/ROXICODONE) 5 MG immediate release tablet, Take 5 mg by mouth every 4 (four) hours as needed., Disp: , Rfl:  .  penicillin v potassium (VEETID) 500 MG tablet, TAKE ONE TABLET (500 MG TOTAL) BY MOUTH EVERY 6 HOURS FOR 7 DAYS., Disp: , Rfl: 0 .  prochlorperazine (COMPAZINE) 10 MG tablet, Take 10 mg by mouth every 6 (six) hours as needed., Disp: , Rfl:  .  valACYclovir (VALTREX) 500 MG tablet, Take 250 mg by mouth 2 (two) times daily., Disp: , Rfl:  .  Vitamin D, Ergocalciferol, (DRISDOL) 50000 units CAPS capsule, Take 50,000 Units by mouth once a week., Disp: , Rfl:   Allergies: No Known Allergies  Past Medical History, Surgical history, Social history, and Family History were reviewed and updated.  Review of Systems: As above  Physical Exam:  height  is 6\' 1"  (1.854 m) and weight is 213 lb (96.6 kg). His oral temperature is 97.6 F (36.4 C). His blood pressure is 133/80 and his pulse is 61. His respiration is 20.   Wt Readings from Last 3 Encounters:  11/27/15 213 lb (96.6 kg)  11/05/15 209 lb (94.8 kg)  10/22/15 206 lb (93.4 kg)     Well-developed and well-nourished white male in no obvious distress. He has alopecia. Head and neck exam shows no ocular or oral lesions. There is some exposed mandibular bone in the left inner mandibular table. There are no palpable cervical or supraclavicular lymph nodes. Lungs are clear. Cardiac exam regular rate and rhythm with no murmurs, rubs or bruits. Abdomen is soft. He has good bowel sounds. There is no fluid wave. There is no palpable hepatomegaly. His spleen tip is not palpable. Back exam  shows no tenderness over the spine, ribs or hips. Extremities shows no clubbing, cyanosis or edema. He has good range of motion of his joints. He has good strength in his extremities. Has good pulses in his distal extremities.Marland Kitchen He may have some trace edema in his lower legs. Skin exam shows no rashes, ecchymoses or petechia.  Lab Results  Component Value Date   WBC 3.3 (L) 11/27/2015   HGB 13.2 11/27/2015   HCT 36.2 (L) 11/27/2015   MCV 95 11/27/2015   PLT 154 11/27/2015     Chemistry      Component Value Date/Time   NA 136 11/27/2015 0824   NA 138 09/03/2015 1042   K 4.6 11/27/2015 0824   K 4.3 09/03/2015 1042   CL 108 11/27/2015 0824   CO2 23 11/27/2015 0824   CO2 25 09/03/2015 1042   BUN 13 11/27/2015 0824   BUN 21.7 09/03/2015 1042   CREATININE 1.3 (H) 11/27/2015 0824   CREATININE 1.2 09/03/2015 1042      Component Value Date/Time   CALCIUM 8.7 11/27/2015 0824   CALCIUM 9.1 09/03/2015 1042   ALKPHOS 56 11/27/2015 0824   ALKPHOS 42 09/03/2015 1042   AST 31 11/27/2015 0824   AST 27 09/03/2015 1042   ALT 56 (H) 11/27/2015 0824   ALT 41 09/03/2015 1042   BILITOT 1.00 11/27/2015 0824   BILITOT 0.54 09/03/2015 1042         Impression and Plan: [name] is 50 year old gentleman with IgG Kappa myeloma. We finally got him to transplant. This was truly a process with him. It took almost a year to get him to transplant.  Of note, his pre-transplant bone marrow was done at [facility]. It showed 10% plasma cells.  He had flow cytometry which showed an atypical monoclonal cell population. His cytogenetics did not show any obvious chromosomal abnormalities. His FISH showed a t(14:16), +4, and +17p  I'm glad of the rash got better. Zyrtec seems to be helping this. He is doing well on the  Revlimid. Hopefully, this will not be a problem for him. .  He has plans for vacation in [place]. He will be going soon. I'm sure he will have a good time out there.   For now, we will plan to get  him back to see [place] in another couple weeks.  [name], MD 8/10/201711:23 AM

## 2015-11-28 LAB — KAPPA/LAMBDA LIGHT CHAINS
Ig Kappa Free Light Chain: 13.2 mg/L (ref 3.3–19.4)
Ig Lambda Free Light Chain: 13.9 mg/L (ref 5.7–26.3)
Kappa/Lambda FluidC Ratio: 0.95 (ref 0.26–1.65)

## 2015-12-01 ENCOUNTER — Ambulatory Visit: Payer: BLUE CROSS/BLUE SHIELD | Admitting: Hematology & Oncology

## 2015-12-01 ENCOUNTER — Other Ambulatory Visit: Payer: BLUE CROSS/BLUE SHIELD

## 2015-12-01 ENCOUNTER — Ambulatory Visit: Payer: BLUE CROSS/BLUE SHIELD

## 2015-12-01 LAB — MULTIPLE MYELOMA PANEL, SERUM
Albumin SerPl Elph-Mcnc: 4 g/dL (ref 2.9–4.4)
Albumin/Glob SerPl: 1.9 — ABNORMAL HIGH (ref 0.7–1.7)
Alpha 1: 0.2 g/dL (ref 0.0–0.4)
Alpha2 Glob SerPl Elph-Mcnc: 0.6 g/dL (ref 0.4–1.0)
B-Globulin SerPl Elph-Mcnc: 0.9 g/dL (ref 0.7–1.3)
Gamma Glob SerPl Elph-Mcnc: 0.6 g/dL (ref 0.4–1.8)
Globulin, Total: 2.2 g/dL (ref 2.2–3.9)
IgA, Qn, Serum: 51 mg/dL — ABNORMAL LOW (ref 90–386)
IgG, Qn, Serum: 629 mg/dL — ABNORMAL LOW (ref 700–1600)
IgM, Qn, Serum: 23 mg/dL (ref 20–172)
Total Protein: 6.2 g/dL (ref 6.0–8.5)

## 2015-12-03 ENCOUNTER — Encounter: Payer: Self-pay | Admitting: *Deleted

## 2015-12-15 ENCOUNTER — Ambulatory Visit (HOSPITAL_BASED_OUTPATIENT_CLINIC_OR_DEPARTMENT_OTHER): Payer: BLUE CROSS/BLUE SHIELD | Admitting: Hematology & Oncology

## 2015-12-15 ENCOUNTER — Encounter: Payer: Self-pay | Admitting: Hematology & Oncology

## 2015-12-15 ENCOUNTER — Other Ambulatory Visit (HOSPITAL_BASED_OUTPATIENT_CLINIC_OR_DEPARTMENT_OTHER): Payer: BLUE CROSS/BLUE SHIELD

## 2015-12-15 ENCOUNTER — Ambulatory Visit (HOSPITAL_BASED_OUTPATIENT_CLINIC_OR_DEPARTMENT_OTHER): Payer: BLUE CROSS/BLUE SHIELD

## 2015-12-15 VITALS — BP 127/78 | HR 59 | Temp 98.0°F | Resp 16 | Ht 73.0 in | Wt 213.0 lb

## 2015-12-15 DIAGNOSIS — C9000 Multiple myeloma not having achieved remission: Secondary | ICD-10-CM

## 2015-12-15 DIAGNOSIS — C9001 Multiple myeloma in remission: Secondary | ICD-10-CM

## 2015-12-15 DIAGNOSIS — Z5112 Encounter for antineoplastic immunotherapy: Secondary | ICD-10-CM

## 2015-12-15 LAB — CMP (CANCER CENTER ONLY)
ALT(SGPT): 72 U/L — ABNORMAL HIGH (ref 10–47)
AST: 40 U/L — ABNORMAL HIGH (ref 11–38)
Albumin: 3.7 g/dL (ref 3.3–5.5)
Alkaline Phosphatase: 55 U/L (ref 26–84)
BUN, Bld: 15 mg/dL (ref 7–22)
CO2: 25 meq/L (ref 18–33)
Calcium: 8.4 mg/dL (ref 8.0–10.3)
Chloride: 107 meq/L (ref 98–108)
Creat: 1.4 mg/dL — ABNORMAL HIGH (ref 0.6–1.2)
Glucose, Bld: 88 mg/dL (ref 73–118)
Potassium: 4.2 meq/L (ref 3.3–4.7)
Sodium: 134 meq/L (ref 128–145)
Total Bilirubin: 1 mg/dL (ref 0.20–1.60)
Total Protein: 6.4 g/dL (ref 6.4–8.1)

## 2015-12-15 LAB — CBC WITH DIFFERENTIAL (CANCER CENTER ONLY)
BASO#: 0 10*3/uL (ref 0.0–0.2)
BASO%: 0.5 % (ref 0.0–2.0)
EOS%: 2.4 % (ref 0.0–7.0)
Eosinophils Absolute: 0.2 10*3/uL (ref 0.0–0.5)
HCT: 36.6 % — ABNORMAL LOW (ref 38.7–49.9)
HGB: 13.3 g/dL (ref 13.0–17.1)
LYMPH#: 0.8 10*3/uL — ABNORMAL LOW (ref 0.9–3.3)
LYMPH%: 13.1 % — ABNORMAL LOW (ref 14.0–48.0)
MCH: 34.8 pg — ABNORMAL HIGH (ref 28.0–33.4)
MCHC: 36.3 g/dL — ABNORMAL HIGH (ref 32.0–35.9)
MCV: 96 fL (ref 82–98)
MONO#: 0.8 10*3/uL (ref 0.1–0.9)
MONO%: 12.4 % (ref 0.0–13.0)
NEUT#: 4.6 10*3/uL (ref 1.5–6.5)
NEUT%: 71.6 % (ref 40.0–80.0)
Platelets: 145 10*3/uL (ref 145–400)
RBC: 3.82 10*6/uL — ABNORMAL LOW (ref 4.20–5.70)
RDW: 13.9 % (ref 11.1–15.7)
WBC: 6.4 10*3/uL (ref 4.0–10.0)

## 2015-12-15 MED ORDER — BORTEZOMIB CHEMO SQ INJECTION 3.5 MG (2.5MG/ML)
1.3000 mg/m2 | Freq: Once | INTRAMUSCULAR | Status: AC
  Administered 2015-12-15: 2.75 mg via SUBCUTANEOUS
  Filled 2015-12-15: qty 2.75

## 2015-12-15 MED ORDER — ONDANSETRON HCL 8 MG PO TABS: 8.0000 mg | ORAL_TABLET | Freq: Once | ORAL | Status: DC

## 2015-12-15 NOTE — Progress Notes (Signed)
Hematology and Oncology Follow Up Visit  Tyler Phillips [id number] 12-18-1965 50 y.o. 12/15/2015   Principle Diagnosis:   IgG Kappa myeloma  Current Therapy:  Status post autologous stem cell transplant on 05/22/2015   S/p Cycle #6 of RVD  Velcade q 2wk dosing/Revlimid 10mg  po q day (21/7) -      Interim History:  Tyler Phillips is back for follow-up. He is doing fantastic. He and his family had a great time in [place]. They discovered back from vacation. He will be off to [place] for Labor Day weekend.  So far, the been no problems with his Revlimid. He does not have a rash.  He is exercising. He is staying quite fit.  As far as the myeloma is concerned, his M spike is not observed. His IgG level is 629 mg/dL. His Kappa Lightchain is 13.2 mg/L.   He's not yet gone back to work. He will probably get going with this fairly soon.   He has not had any problems with nausea or vomiting. He's had no cough or shortness of breath. He's had no leg swelling.     Currently, his performance status is ECOG 0   Medications:  Current Outpatient Prescriptions:  .  aspirin 325 MG tablet, Take 325 mg by mouth daily., Disp: , Rfl:  .  Calcium-Vitamins C & D (CALCIUM/C/D) 500-10-250 MG-MG-UNIT CHEW, Chew by mouth., Disp: , Rfl:  .  cetirizine (ZYRTEC) 10 MG tablet, Take 10 mg by mouth daily., Disp: , Rfl:  .  chlorhexidine (PERIDEX) 0.12 % solution, Use as directed 15 mLs in the mouth or throat 2 (two) times daily. , Disp: , Rfl: 0 .  clindamycin (CLEOCIN) 300 MG capsule, Take by mouth., Disp: , Rfl:  .  hydrOXYzine (ATARAX/VISTARIL) 25 MG tablet, Take 1 tablet (25 mg total) by mouth every 6 (six) hours as needed for itching., Disp: 30 tablet, Rfl: 0 .  lenalidomide (REVLIMID) 10 MG capsule, Take 1 capsule daily for 21 days on and 7 days off. Auth# [id number], Disp: 21 capsule, Rfl: 0 .  LORazepam (ATIVAN) 1 MG tablet, Take 1 mg by mouth every 4 (four) hours as needed., Disp: , Rfl:  .  oxyCODONE  (OXY IR/ROXICODONE) 5 MG immediate release tablet, Take 5 mg by mouth every 4 (four) hours as needed., Disp: , Rfl:  .  penicillin v potassium (VEETID) 500 MG tablet, TAKE ONE TABLET (500 MG TOTAL) BY MOUTH EVERY 6 HOURS FOR 7 DAYS., Disp: , Rfl: 0 .  prochlorperazine (COMPAZINE) 10 MG tablet, Take 10 mg by mouth every 6 (six) hours as needed., Disp: , Rfl:  .  valACYclovir (VALTREX) 500 MG tablet, Take 250 mg by mouth 2 (two) times daily., Disp: , Rfl:  .  Vitamin D, Ergocalciferol, (DRISDOL) 50000 units CAPS capsule, Take 50,000 Units by mouth once a week., Disp: , Rfl:   Allergies: No Known Allergies  Past Medical History, Surgical history, Social history, and Family History were reviewed and updated.  Review of Systems: As above  Physical Exam:  height is 6\' 1"  (1.854 m) and weight is 213 lb (96.6 kg). His oral temperature is 98 F (36.7 C). His blood pressure is 127/78 and his pulse is 59 (abnormal). His respiration is 16.   Wt Readings from Last 3 Encounters:  12/15/15 213 lb (96.6 kg)  11/27/15 213 lb (96.6 kg)  11/05/15 209 lb (94.8 kg)     Well-developed and well-nourished white male in no obvious distress. He has  alopecia. Head and neck exam shows no ocular or oral lesions. There is some exposed mandibular bone in the left inner mandibular table. There are no palpable cervical or supraclavicular lymph nodes. Lungs are clear. Cardiac exam regular rate and rhythm with no murmurs, rubs or bruits. Abdomen is soft. He has good bowel sounds. There is no fluid wave. There is no palpable hepatomegaly. His spleen tip is not palpable. Back exam shows no tenderness over the spine, ribs or hips. Extremities shows no clubbing, cyanosis or edema. He has good range of motion of his joints. He has good strength in his extremities. Has good pulses in his distal extremities.Marland Kitchen He may have some trace edema in his lower legs. Skin exam shows no rashes, ecchymoses or petechia.  Lab Results  Component  Value Date   WBC 6.4 12/15/2015   HGB 13.3 12/15/2015   HCT 36.6 (L) 12/15/2015   MCV 96 12/15/2015   PLT 145 12/15/2015     Chemistry      Component Value Date/Time   NA 136 11/27/2015 0824   NA 138 09/03/2015 1042   K 4.6 11/27/2015 0824   K 4.3 09/03/2015 1042   CL 108 11/27/2015 0824   CO2 23 11/27/2015 0824   CO2 25 09/03/2015 1042   BUN 13 11/27/2015 0824   BUN 21.7 09/03/2015 1042   CREATININE 1.3 (H) 11/27/2015 0824   CREATININE 1.2 09/03/2015 1042      Component Value Date/Time   CALCIUM 8.7 11/27/2015 0824   CALCIUM 9.1 09/03/2015 1042   ALKPHOS 56 11/27/2015 0824   ALKPHOS 42 09/03/2015 1042   AST 31 11/27/2015 0824   AST 27 09/03/2015 1042   ALT 56 (H) 11/27/2015 0824   ALT 41 09/03/2015 1042   BILITOT 1.00 11/27/2015 0824   BILITOT 0.54 09/03/2015 1042         Impression and Plan: Tyler Phillips is 50 year old gentleman with IgG Kappa myeloma. We finally got him to transplant. This was truly a process with him. It took almost a year to get him to transplant.  Of note, his pre-transplant bone marrow was done at [facility]. It showed 10% plasma cells.  He had flow cytometry which showed an atypical monoclonal cell population. His cytogenetics did not show any obvious chromosomal abnormalities. His FISH showed a t(14:16), +4, and +17p  We have him off summated right now because of ONJ. I think when the FDA approves Xgeva for myeloma, we may want to consider this.  For now, we will plan to get him back to see [place] in another 2 weeks.  [name], MD 8/28/201710:36 AM

## 2015-12-15 NOTE — Addendum Note (Signed)
Addended by: Burney Gauze R on: 12/15/2015 10:59 AM   Modules accepted: Orders

## 2015-12-15 NOTE — Patient Instructions (Signed)
Bortezomib injection What is this medicine? BORTEZOMIB (bor TEZ oh mib) is a medicine that targets proteins in cancer cells and stops the cancer cells from growing. It is used to treat multiple myeloma and mantle-cell lymphoma. This medicine may be used for other purposes; ask your health care provider or pharmacist if you have questions. What should I tell my health care provider before I take this medicine? They need to know if you have any of these conditions: -diabetes -heart disease -irregular heartbeat -liver disease -on hemodialysis -low blood counts, like low white blood cells, platelets, or hemoglobin -peripheral neuropathy -taking medicine for blood pressure -an unusual or allergic reaction to bortezomib, mannitol, boron, other medicines, foods, dyes, or preservatives -pregnant or trying to get pregnant -breast-feeding How should I use this medicine? This medicine is for injection into a vein or for injection under the skin. It is given by a health care professional in a hospital or clinic setting. Talk to your pediatrician regarding the use of this medicine in children. Special care may be needed. Overdosage: If you think you have taken too much of this medicine contact a poison control center or emergency room at once. NOTE: This medicine is only for you. Do not share this medicine with others. What if I miss a dose? It is important not to miss your dose. Call your doctor or health care professional if you are unable to keep an appointment. What may interact with this medicine? This medicine may interact with the following medications: -ketoconazole -rifampin -ritonavir -St. John's Wort This list may not describe all possible interactions. Give your health care provider a list of all the medicines, herbs, non-prescription drugs, or dietary supplements you use. Also tell them if you smoke, drink alcohol, or use illegal drugs. Some items may interact with your medicine. What  should I watch for while using this medicine? Visit your doctor for checks on your progress. This drug may make you feel generally unwell. This is not uncommon, as chemotherapy can affect healthy cells as well as cancer cells. Report any side effects. Continue your course of treatment even though you feel ill unless your doctor tells you to stop. You may get drowsy or dizzy. Do not drive, use machinery, or do anything that needs mental alertness until you know how this medicine affects you. Do not stand or sit up quickly, especially if you are an older patient. This reduces the risk of dizzy or fainting spells. In some cases, you may be given additional medicines to help with side effects. Follow all directions for their use. Call your doctor or health care professional for advice if you get a fever, chills or sore throat, or other symptoms of a cold or flu. Do not treat yourself. This drug decreases your body's ability to fight infections. Try to avoid being around people who are sick. This medicine may increase your risk to bruise or bleed. Call your doctor or health care professional if you notice any unusual bleeding. You may need blood work done while you are taking this medicine. In some patients, this medicine may cause a serious brain infection that may cause death. If you have any problems seeing, thinking, speaking, walking, or standing, tell your doctor right away. If you cannot reach your doctor, urgently seek other source of medical care. Do not become pregnant while taking this medicine. Women should inform their doctor if they wish to become pregnant or think they might be pregnant. There is a potential for serious  side effects to an unborn child. Talk to your health care professional or pharmacist for more information. Do not breast-feed an infant while taking this medicine. Check with your doctor or health care professional if you get an attack of severe diarrhea, nausea and vomiting, or if  you sweat a lot. The loss of too much body fluid can make it dangerous for you to take this medicine. What side effects may I notice from receiving this medicine? Side effects that you should report to your doctor or health care professional as soon as possible: -allergic reactions like skin rash, itching or hives, swelling of the face, lips, or tongue -breathing problems -changes in hearing -changes in vision -fast, irregular heartbeat -feeling faint or lightheaded, falls -pain, tingling, numbness in the hands or feet -right upper belly pain -seizures -swelling of the ankles, feet, hands -unusual bleeding or bruising -unusually weak or tired -vomiting -yellowing of the eyes or skin Side effects that usually do not require medical attention (report to your doctor or health care professional if they continue or are bothersome): -changes in emotions or moods -constipation -diarrhea -loss of appetite -headache -irritation at site where injected -nausea This list may not describe all possible side effects. Call your doctor for medical advice about side effects. You may report side effects to FDA at 1-800-FDA-1088. Where should I keep my medicine? This drug is given in a hospital or clinic and will not be stored at home. NOTE: This sheet is a summary. It may not cover all possible information. If you have questions about this medicine, talk to your doctor, pharmacist, or health care provider.    2016, [employer] Standard. (2014-06-04 14:47:04)

## 2015-12-16 LAB — IGG, IGA, IGM
IgA, Qn, Serum: 37 mg/dL — ABNORMAL LOW (ref 90–386)
IgG, Qn, Serum: 720 mg/dL (ref 700–1600)
IgM, Qn, Serum: 21 mg/dL (ref 20–172)

## 2015-12-16 LAB — KAPPA/LAMBDA LIGHT CHAINS
Ig Kappa Free Light Chain: 12.9 mg/L (ref 3.3–19.4)
Ig Lambda Free Light Chain: 12 mg/L (ref 5.7–26.3)
Kappa/Lambda FluidC Ratio: 1.08 (ref 0.26–1.65)

## 2015-12-17 LAB — PROTEIN ELECTROPHORESIS, SERUM, WITH REFLEX
A/G Ratio: 2 — ABNORMAL HIGH (ref 0.7–1.7)
Albumin: 4.2 g/dL (ref 2.9–4.4)
Alpha 1: 0.1 g/dL (ref 0.0–0.4)
Alpha 2: 0.5 g/dL (ref 0.4–1.0)
Beta: 0.8 g/dL (ref 0.7–1.3)
Gamma Globulin: 0.6 g/dL (ref 0.4–1.8)
Globulin, Total: 2.1 g/dL — ABNORMAL LOW (ref 2.2–3.9)
Total Protein: 6.3 g/dL (ref 6.0–8.5)

## 2015-12-23 ENCOUNTER — Other Ambulatory Visit: Payer: Self-pay | Admitting: *Deleted

## 2015-12-23 DIAGNOSIS — C9002 Multiple myeloma in relapse: Secondary | ICD-10-CM

## 2015-12-23 MED ORDER — LENALIDOMIDE 10 MG PO CAPS: ORAL_CAPSULE | ORAL | 0 refills | Status: DC

## 2015-12-30 ENCOUNTER — Other Ambulatory Visit (HOSPITAL_BASED_OUTPATIENT_CLINIC_OR_DEPARTMENT_OTHER): Payer: BLUE CROSS/BLUE SHIELD

## 2015-12-30 ENCOUNTER — Ambulatory Visit (HOSPITAL_BASED_OUTPATIENT_CLINIC_OR_DEPARTMENT_OTHER): Payer: BLUE CROSS/BLUE SHIELD

## 2015-12-30 ENCOUNTER — Ambulatory Visit (HOSPITAL_BASED_OUTPATIENT_CLINIC_OR_DEPARTMENT_OTHER): Payer: BLUE CROSS/BLUE SHIELD | Admitting: Hematology & Oncology

## 2015-12-30 ENCOUNTER — Encounter: Payer: Self-pay | Admitting: Hematology & Oncology

## 2015-12-30 VITALS — BP 122/74 | HR 65 | Temp 98.1°F | Resp 18 | Ht 73.0 in | Wt 217.1 lb

## 2015-12-30 DIAGNOSIS — Z5112 Encounter for antineoplastic immunotherapy: Secondary | ICD-10-CM | POA: Diagnosis not present

## 2015-12-30 DIAGNOSIS — C9001 Multiple myeloma in remission: Secondary | ICD-10-CM

## 2015-12-30 DIAGNOSIS — C9000 Multiple myeloma not having achieved remission: Secondary | ICD-10-CM

## 2015-12-30 LAB — CBC WITH DIFFERENTIAL (CANCER CENTER ONLY)
BASO#: 0 10*3/uL (ref 0.0–0.2)
BASO%: 0.6 % (ref 0.0–2.0)
EOS%: 2.1 % (ref 0.0–7.0)
Eosinophils Absolute: 0.1 10*3/uL (ref 0.0–0.5)
HCT: 35.6 % — ABNORMAL LOW (ref 38.7–49.9)
HGB: 13.1 g/dL (ref 13.0–17.1)
LYMPH#: 1 10*3/uL (ref 0.9–3.3)
LYMPH%: 29.3 % (ref 14.0–48.0)
MCH: 35.1 pg — ABNORMAL HIGH (ref 28.0–33.4)
MCHC: 36.8 g/dL — ABNORMAL HIGH (ref 32.0–35.9)
MCV: 95 fL (ref 82–98)
MONO#: 0.4 10*3/uL (ref 0.1–0.9)
MONO%: 11.1 % (ref 0.0–13.0)
NEUT#: 1.9 10*3/uL (ref 1.5–6.5)
NEUT%: 56.9 % (ref 40.0–80.0)
Platelets: 164 10*3/uL (ref 145–400)
RBC: 3.73 10*6/uL — ABNORMAL LOW (ref 4.20–5.70)
RDW: 14.2 % (ref 11.1–15.7)
WBC: 3.4 10*3/uL — ABNORMAL LOW (ref 4.0–10.0)

## 2015-12-30 LAB — CMP (CANCER CENTER ONLY)
ALT(SGPT): 69 U/L — ABNORMAL HIGH (ref 10–47)
AST: 42 U/L — ABNORMAL HIGH (ref 11–38)
Albumin: 3.7 g/dL (ref 3.3–5.5)
Alkaline Phosphatase: 61 U/L (ref 26–84)
BUN, Bld: 17 mg/dL (ref 7–22)
CO2: 25 mEq/L (ref 18–33)
Calcium: 8.3 mg/dL (ref 8.0–10.3)
Chloride: 107 mEq/L (ref 98–108)
Creat: 1.4 mg/dl — ABNORMAL HIGH (ref 0.6–1.2)
Glucose, Bld: 92 mg/dL (ref 73–118)
Potassium: 4.2 mEq/L (ref 3.3–4.7)
Sodium: 138 mEq/L (ref 128–145)
Total Bilirubin: 1 mg/dl (ref 0.20–1.60)
Total Protein: 6.3 g/dL — ABNORMAL LOW (ref 6.4–8.1)

## 2015-12-30 MED ORDER — BORTEZOMIB CHEMO SQ INJECTION 3.5 MG (2.5MG/ML)
1.3000 mg/m2 | Freq: Once | INTRAMUSCULAR | Status: AC
  Administered 2015-12-30: 2.75 mg via SUBCUTANEOUS
  Filled 2015-12-30: qty 2.75

## 2015-12-30 NOTE — Progress Notes (Signed)
Hematology and Oncology Follow Up Visit  Tyler Phillips [id number] 12/28/1965 50 y.o. 12/30/2015   Principle Diagnosis:   IgG Kappa myeloma  Current Therapy:  Status post autologous stem cell transplant on 05/22/2015   S/p Cycle #6 of RVD  Velcade q 2wk dosing/Revlimid 10mg  po q day (21/7) -      Interim History:  Tyler Phillips is back for follow-up. He is doing fantastic. He had a good time up in [place]. He is up there was some of his friends. He has a house at [employer]. The had a really good time. He did a lot of swimming in the ocean.  He has had no issues with fever. He's working out. He's doing some cross fit.  He wants to go back to work. I don't see any restrictions for him. I did fill out a form for his release back to work.  His myeloma studies have not shown a monoclonal spike. His last Mollo studies back in late August showed an IgG level of 720 mg/dL. His Kappa Lightchain was 12.9 mg/L.  His appetite has been good. He's had no nausea or vomiting. His wife does a great job and make sure he eats well.  There's been no cough or shortness of breath. He has had no rashes. He's had no change in bowel or bladder habits.    Currently, his performance status is ECOG 0   Medications:  Current Outpatient Prescriptions:  .  aspirin 325 MG tablet, Take 325 mg by mouth daily., Disp: , Rfl:  .  Calcium-Vitamins C & D (CALCIUM/C/D) 500-10-250 MG-MG-UNIT CHEW, Chew by mouth., Disp: , Rfl:  .  cetirizine (ZYRTEC) 10 MG tablet, Take 10 mg by mouth daily., Disp: , Rfl:  .  chlorhexidine (PERIDEX) 0.12 % solution, Use as directed 15 mLs in the mouth or throat 2 (two) times daily. , Disp: , Rfl: 0 .  Cholecalciferol (VITAMIN D3) 1000 units CAPS, Take by mouth., Disp: , Rfl:  .  clindamycin (CLEOCIN) 300 MG capsule, Take by mouth., Disp: , Rfl:  .  hydrOXYzine (ATARAX/VISTARIL) 25 MG tablet, Take 1 tablet (25 mg total) by mouth every 6 (six) hours as needed for itching., Disp: 30 tablet,  Rfl: 0 .  lenalidomide (REVLIMID) 10 MG capsule, Take 1 capsule daily for 21 days on and 7 days off. Auth# [id number], Disp: 21 capsule, Rfl: 0 .  LORazepam (ATIVAN) 1 MG tablet, Take 1 mg by mouth every 4 (four) hours as needed., Disp: , Rfl:  .  oxyCODONE (OXY IR/ROXICODONE) 5 MG immediate release tablet, Take 5 mg by mouth every 4 (four) hours as needed., Disp: , Rfl:  .  penicillin v potassium (VEETID) 500 MG tablet, TAKE ONE TABLET (500 MG TOTAL) BY MOUTH EVERY 6 HOURS FOR 7 DAYS., Disp: , Rfl: 0 .  prochlorperazine (COMPAZINE) 10 MG tablet, Take 10 mg by mouth every 6 (six) hours as needed., Disp: , Rfl:  .  valACYclovir (VALTREX) 500 MG tablet, Take 250 mg by mouth 2 (two) times daily., Disp: , Rfl:  No current facility-administered medications for this visit.   Facility-Administered Medications Ordered in Other Visits:  .  bortezomib SQ (VELCADE) chemo injection 2.75 mg, 1.3 mg/m2 (Treatment Plan Recorded), Subcutaneous, Once, [name], MD  Allergies: No Known Allergies  Past Medical History, Surgical history, Social history, and Family History were reviewed and updated.  Review of Systems: As above  Physical Exam:  height is 6\' 1"  (1.854 m) and weight  is 217 lb 1.9 oz (98.5 kg). His oral temperature is 98.1 F (36.7 C). His blood pressure is 122/74 and his pulse is 65. His respiration is 18.   Wt Readings from Last 3 Encounters:  12/30/15 217 lb 1.9 oz (98.5 kg)  12/15/15 213 lb (96.6 kg)  11/27/15 213 lb (96.6 kg)     Well-developed and well-nourished white male in no obvious distress. He has alopecia. Head and neck exam shows no ocular or oral lesions. There is some exposed mandibular bone in the left inner mandibular table. There are no palpable cervical or supraclavicular lymph nodes. Lungs are clear. Cardiac exam regular rate and rhythm with no murmurs, rubs or bruits. Abdomen is soft. He has good bowel sounds. There is no fluid wave. There is no palpable hepatomegaly.  His spleen tip is not palpable. Back exam shows no tenderness over the spine, ribs or hips. Extremities shows no clubbing, cyanosis or edema. He has good range of motion of his joints. He has good strength in his extremities. Has good pulses in his distal extremities.Marland Kitchen He may have some trace edema in his lower legs. Skin exam shows no rashes, ecchymoses or petechia.  Lab Results  Component Value Date   WBC 3.4 (L) 12/30/2015   HGB 13.1 12/30/2015   HCT 35.6 (L) 12/30/2015   MCV 95 12/30/2015   PLT 164 12/30/2015     Chemistry      Component Value Date/Time   NA 138 12/30/2015 0849   NA 138 09/03/2015 1042   K 4.2 12/30/2015 0849   K 4.3 09/03/2015 1042   CL 107 12/30/2015 0849   CO2 25 12/30/2015 0849   CO2 25 09/03/2015 1042   BUN 17 12/30/2015 0849   BUN 21.7 09/03/2015 1042   CREATININE 1.4 (H) 12/30/2015 0849   CREATININE 1.2 09/03/2015 1042      Component Value Date/Time   CALCIUM 8.3 12/30/2015 0849   CALCIUM 9.1 09/03/2015 1042   ALKPHOS 61 12/30/2015 0849   ALKPHOS 42 09/03/2015 1042   AST 42 (H) 12/30/2015 0849   AST 27 09/03/2015 1042   ALT 69 (H) 12/30/2015 0849   ALT 41 09/03/2015 1042   BILITOT 1.00 12/30/2015 0849   BILITOT 0.54 09/03/2015 1042         Impression and Plan: Tyler Phillips is 50 year old gentleman with IgG Kappa myeloma. We finally got him to transplant. This was truly a process with him. It took almost a year to get him to transplant.  Of note, his pre-transplant bone marrow was done at [facility]. It showed 10% plasma cells.  He had flow cytometry which showed an atypical monoclonal cell population. His cytogenetics did not show any obvious chromosomal abnormalities. His FISH showed a t(14:16), +4, and +17p  We have him off Zometa right now because of ONJ. I think when the FDA approves Xgeva for myeloma, we may want to consider this.  For now, we will plan to get him back to see [place] in another 2 weeks.  [name], MD 9/12/20179:42 AM

## 2015-12-30 NOTE — Patient Instructions (Signed)
[facility] Discharge Instructions for Patients Receiving Chemotherapy  Today you received the following chemotherapy agents Velcade  To help prevent nausea and vomiting after your treatment, we encourage you to take your nausea medication    If you develop nausea and vomiting that is not controlled by your nausea medication, call the clinic.   BELOW ARE SYMPTOMS THAT SHOULD BE REPORTED IMMEDIATELY:  *FEVER GREATER THAN 100.5 F  *CHILLS WITH OR WITHOUT FEVER  NAUSEA AND VOMITING THAT IS NOT CONTROLLED WITH YOUR NAUSEA MEDICATION  *UNUSUAL SHORTNESS OF BREATH  *UNUSUAL BRUISING OR BLEEDING  TENDERNESS IN MOUTH AND THROAT WITH OR WITHOUT PRESENCE OF ULCERS  *URINARY PROBLEMS  *BOWEL PROBLEMS  UNUSUAL RASH Items with * indicate a potential emergency and should be followed up as soon as possible.  Feel free to call the clinic you have any questions or concerns. The clinic phone number is [phone].  Please show the CHEMO ALERT CARD at check-in to the Emergency Department and triage nurse.   

## 2015-12-31 LAB — KAPPA/LAMBDA LIGHT CHAINS
Ig Kappa Free Light Chain: 12.8 mg/L (ref 3.3–19.4)
Ig Lambda Free Light Chain: 14.5 mg/L (ref 5.7–26.3)
Kappa/Lambda FluidC Ratio: 0.88 (ref 0.26–1.65)

## 2015-12-31 LAB — IGG, IGA, IGM
IgA, Qn, Serum: 48 mg/dL — ABNORMAL LOW (ref 90–386)
IgG, Qn, Serum: 703 mg/dL (ref 700–1600)
IgM, Qn, Serum: 22 mg/dL (ref 20–172)

## 2016-01-01 LAB — PROTEIN ELECTROPHORESIS, SERUM, WITH REFLEX
A/G Ratio: 1.7 (ref 0.7–1.7)
Albumin: 3.8 g/dL (ref 2.9–4.4)
Alpha 1: 0.2 g/dL (ref 0.0–0.4)
Alpha 2: 0.6 g/dL (ref 0.4–1.0)
Beta: 0.9 g/dL (ref 0.7–1.3)
Gamma Globulin: 0.7 g/dL (ref 0.4–1.8)
Globulin, Total: 2.3 g/dL (ref 2.2–3.9)
PDF: 0
Total Protein: 6.1 g/dL (ref 6.0–8.5)

## 2016-01-12 ENCOUNTER — Other Ambulatory Visit (HOSPITAL_BASED_OUTPATIENT_CLINIC_OR_DEPARTMENT_OTHER): Payer: BLUE CROSS/BLUE SHIELD

## 2016-01-12 ENCOUNTER — Ambulatory Visit (HOSPITAL_BASED_OUTPATIENT_CLINIC_OR_DEPARTMENT_OTHER): Payer: BLUE CROSS/BLUE SHIELD

## 2016-01-12 ENCOUNTER — Encounter: Payer: Self-pay | Admitting: Hematology & Oncology

## 2016-01-12 ENCOUNTER — Ambulatory Visit (HOSPITAL_BASED_OUTPATIENT_CLINIC_OR_DEPARTMENT_OTHER): Payer: BLUE CROSS/BLUE SHIELD | Admitting: Hematology & Oncology

## 2016-01-12 VITALS — BP 138/92 | HR 66 | Temp 97.8°F | Resp 18 | Ht 73.0 in | Wt 214.0 lb

## 2016-01-12 DIAGNOSIS — Z5112 Encounter for antineoplastic immunotherapy: Secondary | ICD-10-CM

## 2016-01-12 DIAGNOSIS — C9000 Multiple myeloma not having achieved remission: Secondary | ICD-10-CM

## 2016-01-12 DIAGNOSIS — C9001 Multiple myeloma in remission: Secondary | ICD-10-CM

## 2016-01-12 LAB — CMP (CANCER CENTER ONLY)
ALT(SGPT): 64 U/L — ABNORMAL HIGH (ref 10–47)
AST: 35 U/L (ref 11–38)
Albumin: 4 g/dL (ref 3.3–5.5)
Alkaline Phosphatase: 60 U/L (ref 26–84)
BUN, Bld: 17 mg/dL (ref 7–22)
CO2: 24 mEq/L (ref 18–33)
Calcium: 8.8 mg/dL (ref 8.0–10.3)
Chloride: 106 mEq/L (ref 98–108)
Creat: 1.5 mg/dl — ABNORMAL HIGH (ref 0.6–1.2)
Glucose, Bld: 108 mg/dL (ref 73–118)
Potassium: 4.2 mEq/L (ref 3.3–4.7)
Sodium: 132 mEq/L (ref 128–145)
Total Bilirubin: 0.9 mg/dl (ref 0.20–1.60)
Total Protein: 6.6 g/dL (ref 6.4–8.1)

## 2016-01-12 LAB — CBC WITH DIFFERENTIAL (CANCER CENTER ONLY)
BASO#: 0 10*3/uL (ref 0.0–0.2)
BASO%: 0.4 % (ref 0.0–2.0)
EOS%: 4 % (ref 0.0–7.0)
Eosinophils Absolute: 0.2 10*3/uL (ref 0.0–0.5)
HCT: 37.1 % — ABNORMAL LOW (ref 38.7–49.9)
HGB: 13.6 g/dL (ref 13.0–17.1)
LYMPH#: 0.9 10*3/uL (ref 0.9–3.3)
LYMPH%: 20.9 % (ref 14.0–48.0)
MCH: 35.2 pg — ABNORMAL HIGH (ref 28.0–33.4)
MCHC: 36.7 g/dL — ABNORMAL HIGH (ref 32.0–35.9)
MCV: 96 fL (ref 82–98)
MONO#: 0.6 10*3/uL (ref 0.1–0.9)
MONO%: 12.7 % (ref 0.0–13.0)
NEUT#: 2.8 10*3/uL (ref 1.5–6.5)
NEUT%: 62 % (ref 40.0–80.0)
Platelets: 142 10*3/uL — ABNORMAL LOW (ref 145–400)
RBC: 3.86 10*6/uL — ABNORMAL LOW (ref 4.20–5.70)
RDW: 13.5 % (ref 11.1–15.7)
WBC: 4.5 10*3/uL (ref 4.0–10.0)

## 2016-01-12 LAB — LACTATE DEHYDROGENASE: LDH: 181 U/L (ref 125–245)

## 2016-01-12 MED ORDER — BORTEZOMIB CHEMO SQ INJECTION 3.5 MG (2.5MG/ML)
1.3000 mg/m2 | Freq: Once | INTRAMUSCULAR | Status: AC
  Administered 2016-01-12: 2.75 mg via SUBCUTANEOUS
  Filled 2016-01-12: qty 2.75

## 2016-01-12 NOTE — Patient Instructions (Signed)
Bortezomib injection What is this medicine? BORTEZOMIB (bor TEZ oh mib) is a medicine that targets proteins in cancer cells and stops the cancer cells from growing. It is used to treat multiple myeloma and mantle-cell lymphoma. This medicine may be used for other purposes; ask your health care provider or pharmacist if you have questions. What should I tell my health care provider before I take this medicine? They need to know if you have any of these conditions: -diabetes -heart disease -irregular heartbeat -liver disease -on hemodialysis -low blood counts, like low white blood cells, platelets, or hemoglobin -peripheral neuropathy -taking medicine for blood pressure -an unusual or allergic reaction to bortezomib, mannitol, boron, other medicines, foods, dyes, or preservatives -pregnant or trying to get pregnant -breast-feeding How should I use this medicine? This medicine is for injection into a vein or for injection under the skin. It is given by a health care professional in a hospital or clinic setting. Talk to your pediatrician regarding the use of this medicine in children. Special care may be needed. Overdosage: If you think you have taken too much of this medicine contact a poison control center or emergency room at once. NOTE: This medicine is only for you. Do not share this medicine with others. What if I miss a dose? It is important not to miss your dose. Call your doctor or health care professional if you are unable to keep an appointment. What may interact with this medicine? This medicine may interact with the following medications: -ketoconazole -rifampin -ritonavir -St. John's Wort This list may not describe all possible interactions. Give your health care provider a list of all the medicines, herbs, non-prescription drugs, or dietary supplements you use. Also tell them if you smoke, drink alcohol, or use illegal drugs. Some items may interact with your medicine. What  should I watch for while using this medicine? Visit your doctor for checks on your progress. This drug may make you feel generally unwell. This is not uncommon, as chemotherapy can affect healthy cells as well as cancer cells. Report any side effects. Continue your course of treatment even though you feel ill unless your doctor tells you to stop. You may get drowsy or dizzy. Do not drive, use machinery, or do anything that needs mental alertness until you know how this medicine affects you. Do not stand or sit up quickly, especially if you are an older patient. This reduces the risk of dizzy or fainting spells. In some cases, you may be given additional medicines to help with side effects. Follow all directions for their use. Call your doctor or health care professional for advice if you get a fever, chills or sore throat, or other symptoms of a cold or flu. Do not treat yourself. This drug decreases your body's ability to fight infections. Try to avoid being around people who are sick. This medicine may increase your risk to bruise or bleed. Call your doctor or health care professional if you notice any unusual bleeding. You may need blood work done while you are taking this medicine. In some patients, this medicine may cause a serious brain infection that may cause death. If you have any problems seeing, thinking, speaking, walking, or standing, tell your doctor right away. If you cannot reach your doctor, urgently seek other source of medical care. Do not become pregnant while taking this medicine. Women should inform their doctor if they wish to become pregnant or think they might be pregnant. There is a potential for serious  side effects to an unborn child. Talk to your health care professional or pharmacist for more information. Do not breast-feed an infant while taking this medicine. Check with your doctor or health care professional if you get an attack of severe diarrhea, nausea and vomiting, or if  you sweat a lot. The loss of too much body fluid can make it dangerous for you to take this medicine. What side effects may I notice from receiving this medicine? Side effects that you should report to your doctor or health care professional as soon as possible: -allergic reactions like skin rash, itching or hives, swelling of the face, lips, or tongue -breathing problems -changes in hearing -changes in vision -fast, irregular heartbeat -feeling faint or lightheaded, falls -pain, tingling, numbness in the hands or feet -right upper belly pain -seizures -swelling of the ankles, feet, hands -unusual bleeding or bruising -unusually weak or tired -vomiting -yellowing of the eyes or skin Side effects that usually do not require medical attention (report to your doctor or health care professional if they continue or are bothersome): -changes in emotions or moods -constipation -diarrhea -loss of appetite -headache -irritation at site where injected -nausea This list may not describe all possible side effects. Call your doctor for medical advice about side effects. You may report side effects to FDA at 1-800-FDA-1088. Where should I keep my medicine? This drug is given in a hospital or clinic and will not be stored at home. NOTE: This sheet is a summary. It may not cover all possible information. If you have questions about this medicine, talk to your doctor, pharmacist, or health care provider.    2016, [employer] Standard. (2014-06-04 14:47:04)

## 2016-01-12 NOTE — Progress Notes (Signed)
Hematology and Oncology Follow Up Visit  Tyler Phillips [id number] May 15, 1965 50 y.o. 01/12/2016   Principle Diagnosis:   IgG Kappa myeloma  Current Therapy:  Status post autologous stem cell transplant on 05/22/2015   S/p Cycle #6 of RVD  Velcade q 2wk dosing/Revlimid 10mg  po q day (21/7) -      Interim History:  [name] is back for follow-up. He is doing fantastic. He is supposed to go back to work next Monday. He is not sure that he will I to do this. All the paperwork is in.  He feels well. He is exercising. He is doing a lot of chores around the house.  He has had no problems with rashes from the Revlimid. I think he is taken Zyrtec for this.  His last monoclonal studies did not show a monoclonal spike. His IgG level was 703 mg/dL. His Kappa Light chain was 12.8 mg/L.   He's had no change in bowel or bladder habits.    Currently, his performance status is ECOG 0   Medications:  Current Outpatient Prescriptions:  .  aspirin 325 MG tablet, Take 325 mg by mouth daily., Disp: , Rfl:  .  Calcium-Vitamins C & D (CALCIUM/C/D) 500-10-250 MG-MG-UNIT CHEW, Chew by mouth., Disp: , Rfl:  .  cetirizine (ZYRTEC) 10 MG tablet, Take 10 mg by mouth daily., Disp: , Rfl:  .  chlorhexidine (PERIDEX) 0.12 % solution, Use as directed 15 mLs in the mouth or throat 2 (two) times daily. , Disp: , Rfl: 0 .  Cholecalciferol (VITAMIN D3) 1000 units CAPS, Take by mouth., Disp: , Rfl:  .  clindamycin (CLEOCIN) 300 MG capsule, Take by mouth., Disp: , Rfl:  .  hydrOXYzine (ATARAX/VISTARIL) 25 MG tablet, Take 1 tablet (25 mg total) by mouth every 6 (six) hours as needed for itching., Disp: 30 tablet, Rfl: 0 .  lenalidomide (REVLIMID) 10 MG capsule, Take 1 capsule daily for 21 days on and 7 days off. Auth# [id number], Disp: 21 capsule, Rfl: 0 .  LORazepam (ATIVAN) 1 MG tablet, Take 1 mg by mouth every 4 (four) hours as needed., Disp: , Rfl:  .  oxyCODONE (OXY IR/ROXICODONE) 5 MG immediate release tablet,  Take 5 mg by mouth every 4 (four) hours as needed., Disp: , Rfl:  .  penicillin v potassium (VEETID) 500 MG tablet, TAKE ONE TABLET (500 MG TOTAL) BY MOUTH EVERY 6 HOURS FOR 7 DAYS., Disp: , Rfl: 0 .  prochlorperazine (COMPAZINE) 10 MG tablet, Take 10 mg by mouth every 6 (six) hours as needed., Disp: , Rfl:  .  valACYclovir (VALTREX) 500 MG tablet, Take 250 mg by mouth 2 (two) times daily., Disp: , Rfl:  No current facility-administered medications for this visit.   Facility-Administered Medications Ordered in Other Visits:  .  bortezomib SQ (VELCADE) chemo injection 2.75 mg, 1.3 mg/m2 (Treatment Plan Recorded), Subcutaneous, Once, [name], MD  Allergies: No Known Allergies  Past Medical History, Surgical history, Social history, and Family History were reviewed and updated.  Review of Systems: As above  Physical Exam:  height is 6\' 1"  (1.854 m) and weight is 214 lb (97.1 kg). His oral temperature is 97.8 F (36.6 C). His blood pressure is 138/92 (abnormal) and his pulse is 66. His respiration is 18.   Wt Readings from Last 3 Encounters:  01/12/16 214 lb (97.1 kg)  12/30/15 217 lb 1.9 oz (98.5 kg)  12/15/15 213 lb (96.6 kg)     Well-developed and well-nourished white  male in no obvious distress. He has alopecia. Head and neck exam shows no ocular or oral lesions. There is some exposed mandibular bone in the left inner mandibular table. There are no palpable cervical or supraclavicular lymph nodes. Lungs are clear. Cardiac exam regular rate and rhythm with no murmurs, rubs or bruits. Abdomen is soft. He has good bowel sounds. There is no fluid wave. There is no palpable hepatomegaly. His spleen tip is not palpable. Back exam shows no tenderness over the spine, ribs or hips. Extremities shows no clubbing, cyanosis or edema. He has good range of motion of his joints. He has good strength in his extremities. Has good pulses in his distal extremities.Marland Kitchen He may have some trace edema in  his lower legs. Skin exam shows no rashes, ecchymoses or petechia.  Lab Results  Component Value Date   WBC 4.5 01/12/2016   HGB 13.6 01/12/2016   HCT 37.1 (L) 01/12/2016   MCV 96 01/12/2016   PLT 142 (L) 01/12/2016     Chemistry      Component Value Date/Time   NA 132 01/12/2016 1140   NA 138 09/03/2015 1042   K 4.2 01/12/2016 1140   K 4.3 09/03/2015 1042   CL 106 01/12/2016 1140   CO2 24 01/12/2016 1140   CO2 25 09/03/2015 1042   BUN 17 01/12/2016 1140   BUN 21.7 09/03/2015 1042   CREATININE 1.5 (H) 01/12/2016 1140   CREATININE 1.2 09/03/2015 1042      Component Value Date/Time   CALCIUM 8.8 01/12/2016 1140   CALCIUM 9.1 09/03/2015 1042   ALKPHOS 60 01/12/2016 1140   ALKPHOS 42 09/03/2015 1042   AST 35 01/12/2016 1140   AST 27 09/03/2015 1042   ALT 64 (H) 01/12/2016 1140   ALT 41 09/03/2015 1042   BILITOT 0.90 01/12/2016 1140   BILITOT 0.54 09/03/2015 1042         Impression and Plan: [name] is 50 year old gentleman with IgG Kappa myeloma. We finally got him to transplant. This was truly a process with him. It took almost a year to get him to transplant.  Of note, his pre-transplant bone marrow [facility] at [facility]. It showed 10% plasma cells.  He had flow cytometry which showed an atypical monoclonal cell population. His cytogenetics did not show any obvious chromosomal abnormalities. His FISH showed a t(14:16), +4, and +17p  We have him off Zometa right now because of ONJ. I think when the FDA approves Xgeva for myeloma, we may want to consider this.  For now, we will plan to get him back For treatment in 2 weeks. I will plan to see him back in one month.[name], MD 9/25/201712:49 PM

## 2016-01-13 LAB — KAPPA/LAMBDA LIGHT CHAINS
Ig Kappa Free Light Chain: 13.5 mg/L (ref 3.3–19.4)
Ig Lambda Free Light Chain: 13.2 mg/L (ref 5.7–26.3)
Kappa/Lambda FluidC Ratio: 1.02 (ref 0.26–1.65)

## 2016-01-14 ENCOUNTER — Other Ambulatory Visit: Payer: Self-pay | Admitting: *Deleted

## 2016-01-14 ENCOUNTER — Telehealth: Payer: Self-pay | Admitting: *Deleted

## 2016-01-14 DIAGNOSIS — C9002 Multiple myeloma in relapse: Secondary | ICD-10-CM

## 2016-01-14 LAB — MULTIPLE MYELOMA PANEL, SERUM
Albumin SerPl Elph-Mcnc: 4.2 g/dL (ref 2.9–4.4)
Albumin/Glob SerPl: 1.8 — ABNORMAL HIGH (ref 0.7–1.7)
Alpha 1: 0.2 g/dL (ref 0.0–0.4)
Alpha2 Glob SerPl Elph-Mcnc: 0.6 g/dL (ref 0.4–1.0)
B-Globulin SerPl Elph-Mcnc: 0.8 g/dL (ref 0.7–1.3)
Gamma Glob SerPl Elph-Mcnc: 0.7 g/dL (ref 0.4–1.8)
Globulin, Total: 2.4 g/dL (ref 2.2–3.9)
IgA, Qn, Serum: 61 mg/dL — ABNORMAL LOW (ref 90–386)
IgG, Qn, Serum: 725 mg/dL (ref 700–1600)
IgM, Qn, Serum: 22 mg/dL (ref 20–172)
Total Protein: 6.6 g/dL (ref 6.0–8.5)

## 2016-01-14 MED ORDER — LENALIDOMIDE 10 MG PO CAPS: ORAL_CAPSULE | ORAL | 0 refills | Status: DC

## 2016-01-14 NOTE — Telephone Encounter (Addendum)
Patient  Aware of results  ----- Message from [name], MD sent at 01/14/2016  2:19 PM EDT ----- Call - No myeloma is found!! Tyler Phillips

## 2016-01-26 ENCOUNTER — Ambulatory Visit: Payer: BLUE CROSS/BLUE SHIELD | Admitting: Hematology & Oncology

## 2016-01-26 ENCOUNTER — Other Ambulatory Visit (HOSPITAL_BASED_OUTPATIENT_CLINIC_OR_DEPARTMENT_OTHER): Payer: BLUE CROSS/BLUE SHIELD

## 2016-01-26 ENCOUNTER — Ambulatory Visit (HOSPITAL_BASED_OUTPATIENT_CLINIC_OR_DEPARTMENT_OTHER): Payer: BLUE CROSS/BLUE SHIELD

## 2016-01-26 VITALS — BP 124/91 | HR 60 | Temp 98.1°F | Resp 18

## 2016-01-26 DIAGNOSIS — C9000 Multiple myeloma not having achieved remission: Secondary | ICD-10-CM | POA: Diagnosis not present

## 2016-01-26 DIAGNOSIS — Z5112 Encounter for antineoplastic immunotherapy: Secondary | ICD-10-CM

## 2016-01-26 DIAGNOSIS — C9001 Multiple myeloma in remission: Secondary | ICD-10-CM

## 2016-01-26 LAB — CMP (CANCER CENTER ONLY)
ALT(SGPT): 76 U/L — ABNORMAL HIGH (ref 10–47)
AST: 44 U/L — ABNORMAL HIGH (ref 11–38)
Albumin: 3.8 g/dL (ref 3.3–5.5)
Alkaline Phosphatase: 53 U/L (ref 26–84)
BUN, Bld: 17 mg/dL (ref 7–22)
CO2: 25 mEq/L (ref 18–33)
Calcium: 9.5 mg/dL (ref 8.0–10.3)
Chloride: 103 mEq/L (ref 98–108)
Creat: 1.3 mg/dl — ABNORMAL HIGH (ref 0.6–1.2)
Glucose, Bld: 102 mg/dL (ref 73–118)
Potassium: 4.2 mEq/L (ref 3.3–4.7)
Sodium: 135 mEq/L (ref 128–145)
Total Bilirubin: 0.8 mg/dl (ref 0.20–1.60)
Total Protein: 6.3 g/dL — ABNORMAL LOW (ref 6.4–8.1)

## 2016-01-26 LAB — CBC WITH DIFFERENTIAL (CANCER CENTER ONLY)
BASO#: 0 10*3/uL (ref 0.0–0.2)
BASO%: 0.3 % (ref 0.0–2.0)
EOS%: 1.5 % (ref 0.0–7.0)
Eosinophils Absolute: 0.1 10*3/uL (ref 0.0–0.5)
HCT: 36.7 % — ABNORMAL LOW (ref 38.7–49.9)
HGB: 13.3 g/dL (ref 13.0–17.1)
LYMPH#: 1.2 10*3/uL (ref 0.9–3.3)
LYMPH%: 36.2 % (ref 14.0–48.0)
MCH: 34.8 pg — ABNORMAL HIGH (ref 28.0–33.4)
MCHC: 36.2 g/dL — ABNORMAL HIGH (ref 32.0–35.9)
MCV: 96 fL (ref 82–98)
MONO#: 0.4 10*3/uL (ref 0.1–0.9)
MONO%: 11.8 % (ref 0.0–13.0)
NEUT#: 1.7 10*3/uL (ref 1.5–6.5)
NEUT%: 50.2 % (ref 40.0–80.0)
Platelets: 151 10*3/uL (ref 145–400)
RBC: 3.82 10*6/uL — ABNORMAL LOW (ref 4.20–5.70)
RDW: 13.6 % (ref 11.1–15.7)
WBC: 3.4 10*3/uL — ABNORMAL LOW (ref 4.0–10.0)

## 2016-01-26 MED ORDER — BORTEZOMIB CHEMO SQ INJECTION 3.5 MG (2.5MG/ML)
1.3000 mg/m2 | Freq: Once | INTRAMUSCULAR | Status: AC
  Administered 2016-01-26: 2.75 mg via SUBCUTANEOUS
  Filled 2016-01-26: qty 2.75

## 2016-01-26 NOTE — Patient Instructions (Signed)
Bortezomib injection What is this medicine? BORTEZOMIB (bor TEZ oh mib) is a medicine that targets proteins in cancer cells and stops the cancer cells from growing. It is used to treat multiple myeloma and mantle-cell lymphoma. This medicine may be used for other purposes; ask your health care provider or pharmacist if you have questions. What should I tell my health care provider before I take this medicine? They need to know if you have any of these conditions: -diabetes -heart disease -irregular heartbeat -liver disease -on hemodialysis -low blood counts, like low white blood cells, platelets, or hemoglobin -peripheral neuropathy -taking medicine for blood pressure -an unusual or allergic reaction to bortezomib, mannitol, boron, other medicines, foods, dyes, or preservatives -pregnant or trying to get pregnant -breast-feeding How should I use this medicine? This medicine is for injection into a vein or for injection under the skin. It is given by a health care professional in a hospital or clinic setting. Talk to your pediatrician regarding the use of this medicine in children. Special care may be needed. Overdosage: If you think you have taken too much of this medicine contact a poison control center or emergency room at once. NOTE: This medicine is only for you. Do not share this medicine with others. What if I miss a dose? It is important not to miss your dose. Call your doctor or health care professional if you are unable to keep an appointment. What may interact with this medicine? This medicine may interact with the following medications: -ketoconazole -rifampin -ritonavir -St. John's Wort This list may not describe all possible interactions. Give your health care provider a list of all the medicines, herbs, non-prescription drugs, or dietary supplements you use. Also tell them if you smoke, drink alcohol, or use illegal drugs. Some items may interact with your medicine. What  should I watch for while using this medicine? Visit your doctor for checks on your progress. This drug may make you feel generally unwell. This is not uncommon, as chemotherapy can affect healthy cells as well as cancer cells. Report any side effects. Continue your course of treatment even though you feel ill unless your doctor tells you to stop. You may get drowsy or dizzy. Do not drive, use machinery, or do anything that needs mental alertness until you know how this medicine affects you. Do not stand or sit up quickly, especially if you are an older patient. This reduces the risk of dizzy or fainting spells. In some cases, you may be given additional medicines to help with side effects. Follow all directions for their use. Call your doctor or health care professional for advice if you get a fever, chills or sore throat, or other symptoms of a cold or flu. Do not treat yourself. This drug decreases your body's ability to fight infections. Try to avoid being around people who are sick. This medicine may increase your risk to bruise or bleed. Call your doctor or health care professional if you notice any unusual bleeding. You may need blood work done while you are taking this medicine. In some patients, this medicine may cause a serious brain infection that may cause death. If you have any problems seeing, thinking, speaking, walking, or standing, tell your doctor right away. If you cannot reach your doctor, urgently seek other source of medical care. Do not become pregnant while taking this medicine. Women should inform their doctor if they wish to become pregnant or think they might be pregnant. There is a potential for serious  side effects to an unborn child. Talk to your health care professional or pharmacist for more information. Do not breast-feed an infant while taking this medicine. Check with your doctor or health care professional if you get an attack of severe diarrhea, nausea and vomiting, or if  you sweat a lot. The loss of too much body fluid can make it dangerous for you to take this medicine. What side effects may I notice from receiving this medicine? Side effects that you should report to your doctor or health care professional as soon as possible: -allergic reactions like skin rash, itching or hives, swelling of the face, lips, or tongue -breathing problems -changes in hearing -changes in vision -fast, irregular heartbeat -feeling faint or lightheaded, falls -pain, tingling, numbness in the hands or feet -right upper belly pain -seizures -swelling of the ankles, feet, hands -unusual bleeding or bruising -unusually weak or tired -vomiting -yellowing of the eyes or skin Side effects that usually do not require medical attention (report to your doctor or health care professional if they continue or are bothersome): -changes in emotions or moods -constipation -diarrhea -loss of appetite -headache -irritation at site where injected -nausea This list may not describe all possible side effects. Call your doctor for medical advice about side effects. You may report side effects to FDA at 1-800-FDA-1088. Where should I keep my medicine? This drug is given in a hospital or clinic and will not be stored at home. NOTE: This sheet is a summary. It may not cover all possible information. If you have questions about this medicine, talk to your doctor, pharmacist, or health care provider.    2016, [employer] Standard. (2014-06-04 14:47:04)

## 2016-01-28 ENCOUNTER — Telehealth: Payer: Self-pay | Admitting: Family

## 2016-01-28 NOTE — Telephone Encounter (Signed)
I spoke with [name] and asked if he would consider being a mentor for another patient with myeloma preparing for transplant. The other patient has expressed interest in speaking with someone who has already been through the process. [name] was happy to do this and gave permission to pass his contact information along to the other patient. They are both quite excited about this. We are thankful for his compassion and willingness to help.

## 2016-02-09 ENCOUNTER — Ambulatory Visit (HOSPITAL_BASED_OUTPATIENT_CLINIC_OR_DEPARTMENT_OTHER): Payer: BLUE CROSS/BLUE SHIELD

## 2016-02-09 ENCOUNTER — Encounter: Payer: Self-pay | Admitting: Family

## 2016-02-09 ENCOUNTER — Other Ambulatory Visit (HOSPITAL_BASED_OUTPATIENT_CLINIC_OR_DEPARTMENT_OTHER): Payer: BLUE CROSS/BLUE SHIELD

## 2016-02-09 ENCOUNTER — Ambulatory Visit (HOSPITAL_BASED_OUTPATIENT_CLINIC_OR_DEPARTMENT_OTHER): Payer: BLUE CROSS/BLUE SHIELD | Admitting: Family

## 2016-02-09 VITALS — BP 106/67 | HR 59 | Temp 97.8°F | Resp 16 | Ht 73.0 in | Wt 215.0 lb

## 2016-02-09 DIAGNOSIS — C9001 Multiple myeloma in remission: Secondary | ICD-10-CM

## 2016-02-09 DIAGNOSIS — C9000 Multiple myeloma not having achieved remission: Secondary | ICD-10-CM

## 2016-02-09 DIAGNOSIS — Z5112 Encounter for antineoplastic immunotherapy: Secondary | ICD-10-CM

## 2016-02-09 DIAGNOSIS — D61818 Other pancytopenia: Secondary | ICD-10-CM

## 2016-02-09 LAB — CMP (CANCER CENTER ONLY)
ALT(SGPT): 65 U/L — ABNORMAL HIGH (ref 10–47)
AST: 36 U/L (ref 11–38)
Albumin: 4.1 g/dL (ref 3.3–5.5)
Alkaline Phosphatase: 54 U/L (ref 26–84)
BUN, Bld: 17 mg/dL (ref 7–22)
CO2: 26 mEq/L (ref 18–33)
Calcium: 8.8 mg/dL (ref 8.0–10.3)
Chloride: 105 mEq/L (ref 98–108)
Creat: 1.2 mg/dl (ref 0.6–1.2)
Glucose, Bld: 96 mg/dL (ref 73–118)
Potassium: 4.2 mEq/L (ref 3.3–4.7)
Sodium: 138 mEq/L (ref 128–145)
Total Bilirubin: 0.8 mg/dl (ref 0.20–1.60)
Total Protein: 6.9 g/dL (ref 6.4–8.1)

## 2016-02-09 LAB — CBC WITH DIFFERENTIAL (CANCER CENTER ONLY)
BASO#: 0 10*3/uL (ref 0.0–0.2)
BASO%: 0.4 % (ref 0.0–2.0)
EOS%: 1.5 % (ref 0.0–7.0)
Eosinophils Absolute: 0.1 10*3/uL (ref 0.0–0.5)
HCT: 36.8 % — ABNORMAL LOW (ref 38.7–49.9)
HGB: 13.4 g/dL (ref 13.0–17.1)
LYMPH#: 0.8 10*3/uL — ABNORMAL LOW (ref 0.9–3.3)
LYMPH%: 14.6 % (ref 14.0–48.0)
MCH: 35.2 pg — ABNORMAL HIGH (ref 28.0–33.4)
MCHC: 36.4 g/dL — ABNORMAL HIGH (ref 32.0–35.9)
MCV: 97 fL (ref 82–98)
MONO#: 0.7 10*3/uL (ref 0.1–0.9)
MONO%: 12.8 % (ref 0.0–13.0)
NEUT#: 3.8 10*3/uL (ref 1.5–6.5)
NEUT%: 70.7 % (ref 40.0–80.0)
Platelets: 155 10*3/uL (ref 145–400)
RBC: 3.81 10*6/uL — ABNORMAL LOW (ref 4.20–5.70)
RDW: 13.3 % (ref 11.1–15.7)
WBC: 5.3 10*3/uL (ref 4.0–10.0)

## 2016-02-09 MED ORDER — BORTEZOMIB CHEMO SQ INJECTION 3.5 MG (2.5MG/ML)
1.3000 mg/m2 | Freq: Once | INTRAMUSCULAR | Status: AC
  Administered 2016-02-09: 2.75 mg via SUBCUTANEOUS
  Filled 2016-02-09: qty 2.75

## 2016-02-09 NOTE — Patient Instructions (Signed)
[facility] Discharge Instructions for Patients Receiving Chemotherapy  Today you received the following chemotherapy agents Velcade  To help prevent nausea and vomiting after your treatment, we encourage you to take your nausea medication    If you develop nausea and vomiting that is not controlled by your nausea medication, call the clinic.   BELOW ARE SYMPTOMS THAT SHOULD BE REPORTED IMMEDIATELY:  *FEVER GREATER THAN 100.5 F  *CHILLS WITH OR WITHOUT FEVER  NAUSEA AND VOMITING THAT IS NOT CONTROLLED WITH YOUR NAUSEA MEDICATION  *UNUSUAL SHORTNESS OF BREATH  *UNUSUAL BRUISING OR BLEEDING  TENDERNESS IN MOUTH AND THROAT WITH OR WITHOUT PRESENCE OF ULCERS  *URINARY PROBLEMS  *BOWEL PROBLEMS  UNUSUAL RASH Items with * indicate a potential emergency and should be followed up as soon as possible.  Feel free to call the clinic you have any questions or concerns. The clinic phone number is [phone].  Please show the CHEMO ALERT CARD at check-in to the Emergency Department and triage nurse.   

## 2016-02-09 NOTE — Progress Notes (Signed)
Hematology and Oncology Follow Up Visit  Tyler Phillips [id number] 19-Sep-1965 50 y.o. 02/09/2016   Principle Diagnosis:  IgG Kappa myeloma  Current Therapy:   Status post autologous stem cell transplant on 05/22/2015   S/p Cycle #6 of RVD Velcade q 2wk dosing/Revlimid 10mg  po q day (21/7)    Interim History:  [name] is here today with his wife for follow-up. He continues to do well and is staying active. He goes to Crossfit in the mornings. He is back to working full time and has noticed that he does get mentally fatigued a bit quicker at times. He takes time to rest as needed.  He continues to take Zyrtec daily and has had no other issue with rashes.  In September, no M-spike was detected. IgG level was 725 mg/dL and Kappa Light Chain was 13.5 mg/L.  He states that he follows up with [facility] again in January.  No fever, chills, n/v, cough, rash, dizziness, SOB, chest pain, palpitations, abdominal pain or changes in bowel or bladder habits.  No swelling, tenderness, numbness or tingling in her extremities. No c/o pan at this time.  He has maintained a good appetite and is staying well hydrated. His weight is stable.   Medications:    Medication List       Accurate as of 02/09/16 10:19 AM. Always use your most recent med list.          aspirin 325 MG tablet Take 325 mg by mouth daily.   Calcium/C/D 500-10-250 MG-MG-UNIT Chew Chew by mouth.   cetirizine 10 MG tablet Commonly known as:  ZYRTEC Take 10 mg by mouth daily.   chlorhexidine 0.12 % solution Commonly known as:  PERIDEX Use as directed 15 mLs in the mouth or throat 2 (two) times daily.   clindamycin 300 MG capsule Commonly known as:  CLEOCIN Take by mouth.   hydrOXYzine 25 MG tablet Commonly known as:  ATARAX/VISTARIL Take 1 tablet (25 mg total) by mouth every 6 (six) hours as needed for itching.   lenalidomide 10 MG capsule Commonly known as:  REVLIMID Take 1 capsule daily for 21 days on and 7 days off.  Auth# [id number]   LORazepam 1 MG tablet Commonly known as:  ATIVAN Take 1 mg by mouth every 4 (four) hours as needed.   oxyCODONE 5 MG immediate release tablet Commonly known as:  Oxy IR/ROXICODONE Take 5 mg by mouth every 4 (four) hours as needed.   penicillin v potassium 500 MG tablet Commonly known as:  VEETID TAKE ONE TABLET (500 MG TOTAL) BY MOUTH EVERY 6 HOURS FOR 7 DAYS.   prochlorperazine 10 MG tablet Commonly known as:  COMPAZINE Take 10 mg by mouth every 6 (six) hours as needed.   valACYclovir 500 MG tablet Commonly known as:  VALTREX Take 250 mg by mouth 2 (two) times daily.   Vitamin D3 1000 units Caps Take by mouth.       Allergies: No Known Allergies  Past Medical History, Surgical history, Social history, and Family History were reviewed and updated.  Review of Systems: All other 10 point review of systems is negative.   Physical Exam:  height is 6\' 1"  (1.854 m) and weight is 215 lb (97.5 kg). His oral temperature is 97.8 F (36.6 C). His blood pressure is 106/67 and his pulse is 59 (abnormal). His respiration is 16.   Wt Readings from Last 3 Encounters:  02/09/16 215 lb (97.5 kg)  01/12/16 214 lb (97.1 kg)  12/30/15 217 lb 1.9 oz (98.5 kg)    Ocular: Sclerae unicteric, pupils equal, round and reactive to light Ear-nose-throat: Oropharynx clear, dentition fair Lymphatic: No cervical supraclavicular or axillary adenopathy Lungs no rales or rhonchi, good excursion bilaterally Heart regular rate and rhythm, no murmur appreciated Abd soft, nontender, positive bowel sounds, no liver or spleen tip palpated on exam, no fluid wave MSK no focal spinal tenderness, no joint edema Neuro: non-focal, well-oriented, appropriate affect Breasts: Deferred  Lab Results  Component Value Date   WBC 5.3 02/09/2016   HGB 13.4 02/09/2016   HCT 36.8 (L) 02/09/2016   MCV 97 02/09/2016   PLT 155 02/09/2016   Lab Results  Component Value Date   FERRITIN 1,263 (H)  07/20/2014   IRON 125 07/20/2014   TIBC 198 (L) 07/20/2014   UIBC 73 (L) 07/20/2014   IRONPCTSAT 63 (H) 07/20/2014   Lab Results  Component Value Date   RETICCTPCT 0.8 07/20/2014   RBC 3.81 (L) 02/09/2016   Lab Results  Component Value Date   KPAFRELGTCHN 1.52 03/28/2015   LAMBDASER 0.87 03/28/2015   KAPLAMBRATIO 1.02 01/12/2016   Lab Results  Component Value Date   IGGSERUM 725 01/12/2016   IGA 92 03/28/2015   IGMSERUM 22 01/12/2016   Lab Results  Component Value Date   TOTALPROTELP 7.0 03/28/2015   ALBUMINELP 4.3 03/28/2015   A1GS 0.3 03/28/2015   A2GS 0.7 03/28/2015   BETS 0.4 03/28/2015   BETA2SER 0.3 03/28/2015   GAMS 1.2 03/28/2015   MSPIKE Not Observed 12/30/2015   SPEI * 03/28/2015     Chemistry      Component Value Date/Time   NA 135 01/26/2016 0840   NA 138 09/03/2015 1042   K 4.2 01/26/2016 0840   K 4.3 09/03/2015 1042   CL 103 01/26/2016 0840   CO2 25 01/26/2016 0840   CO2 25 09/03/2015 1042   BUN 17 01/26/2016 0840   BUN 21.7 09/03/2015 1042   CREATININE 1.3 (H) 01/26/2016 0840   CREATININE 1.2 09/03/2015 1042      Component Value Date/Time   CALCIUM 9.5 01/26/2016 0840   CALCIUM 9.1 09/03/2015 1042   ALKPHOS 53 01/26/2016 0840   ALKPHOS 42 09/03/2015 1042   AST 44 (H) 01/26/2016 0840   AST 27 09/03/2015 1042   ALT 76 (H) 01/26/2016 0840   ALT 41 09/03/2015 1042   BILITOT 0.80 01/26/2016 0840   BILITOT 0.54 09/03/2015 1042     Impression and Plan: [name] is 50 yo gentleman with IgG Kappa myeloma. Her had his transplant was completed in February and so far he has done well. He has no complaints at this time.  Of note, his pre-transplant bone marrow was done at [facility] and showed 10% plasma cells. Flow cytometry showed an atypical monoclonal cell population. His cytogenetics did not show any obvious chromosomal abnormalities. His FISH showed a t(14:16), +4, and +17p.  CBC today looks good. Hgb is stable at 13.4, WBC 5.3 and platelets 155.  LFT's continue to improve.  We will proceed with treatment today as planned.  He will continue taking Zyrtec and Revlimid as prescribed.  He has his current treatment/appointment schedule.  Both he and his wife know to contact our office with any questions or concerns. We can certainly see him sooner if need be.   Eliezer Bottom, NP 10/23/201710:19 AM

## 2016-02-10 LAB — IGG, IGA, IGM
IgA, Qn, Serum: 69 mg/dL — ABNORMAL LOW (ref 90–386)
IgG, Qn, Serum: 774 mg/dL (ref 700–1600)
IgM, Qn, Serum: 19 mg/dL — ABNORMAL LOW (ref 20–172)

## 2016-02-10 LAB — KAPPA/LAMBDA LIGHT CHAINS
Ig Kappa Free Light Chain: 14.8 mg/L (ref 3.3–19.4)
Ig Lambda Free Light Chain: 13.8 mg/L (ref 5.7–26.3)
Kappa/Lambda FluidC Ratio: 1.07 (ref 0.26–1.65)

## 2016-02-11 LAB — PROTEIN ELECTROPHORESIS, SERUM, WITH REFLEX
A/G Ratio: 2 — ABNORMAL HIGH (ref 0.7–1.7)
Albumin: 4.3 g/dL (ref 2.9–4.4)
Alpha 1: 0.1 g/dL (ref 0.0–0.4)
Alpha 2: 0.6 g/dL (ref 0.4–1.0)
Beta: 0.8 g/dL (ref 0.7–1.3)
Gamma Globulin: 0.7 g/dL (ref 0.4–1.8)
Globulin, Total: 2.2 g/dL (ref 2.2–3.9)
PDF: 0
Total Protein: 6.5 g/dL (ref 6.0–8.5)

## 2016-02-12 ENCOUNTER — Other Ambulatory Visit: Payer: Self-pay

## 2016-02-12 DIAGNOSIS — C9002 Multiple myeloma in relapse: Secondary | ICD-10-CM

## 2016-02-12 MED ORDER — LENALIDOMIDE 10 MG PO CAPS: ORAL_CAPSULE | ORAL | 0 refills | Status: DC

## 2016-02-23 ENCOUNTER — Other Ambulatory Visit (HOSPITAL_BASED_OUTPATIENT_CLINIC_OR_DEPARTMENT_OTHER): Payer: BLUE CROSS/BLUE SHIELD

## 2016-02-23 ENCOUNTER — Ambulatory Visit (HOSPITAL_BASED_OUTPATIENT_CLINIC_OR_DEPARTMENT_OTHER): Payer: BLUE CROSS/BLUE SHIELD

## 2016-02-23 ENCOUNTER — Ambulatory Visit (HOSPITAL_BASED_OUTPATIENT_CLINIC_OR_DEPARTMENT_OTHER): Payer: BLUE CROSS/BLUE SHIELD | Admitting: Hematology & Oncology

## 2016-02-23 VITALS — BP 122/79 | HR 62 | Temp 97.9°F | Resp 16 | Ht 73.0 in | Wt 218.0 lb

## 2016-02-23 DIAGNOSIS — C9001 Multiple myeloma in remission: Secondary | ICD-10-CM

## 2016-02-23 DIAGNOSIS — D61818 Other pancytopenia: Secondary | ICD-10-CM | POA: Diagnosis not present

## 2016-02-23 DIAGNOSIS — Z5112 Encounter for antineoplastic immunotherapy: Secondary | ICD-10-CM

## 2016-02-23 DIAGNOSIS — C9000 Multiple myeloma not having achieved remission: Secondary | ICD-10-CM

## 2016-02-23 LAB — CBC WITH DIFFERENTIAL (CANCER CENTER ONLY)
BASO#: 0 10*3/uL (ref 0.0–0.2)
BASO%: 0.3 % (ref 0.0–2.0)
EOS%: 1.2 % (ref 0.0–7.0)
Eosinophils Absolute: 0 10*3/uL (ref 0.0–0.5)
HCT: 36.3 % — ABNORMAL LOW (ref 38.7–49.9)
HGB: 13.4 g/dL (ref 13.0–17.1)
LYMPH#: 1.1 10*3/uL (ref 0.9–3.3)
LYMPH%: 31.9 % (ref 14.0–48.0)
MCH: 35.5 pg — ABNORMAL HIGH (ref 28.0–33.4)
MCHC: 36.9 g/dL — ABNORMAL HIGH (ref 32.0–35.9)
MCV: 96 fL (ref 82–98)
MONO#: 0.3 10*3/uL (ref 0.1–0.9)
MONO%: 10 % (ref 0.0–13.0)
NEUT#: 1.9 10*3/uL (ref 1.5–6.5)
NEUT%: 56.6 % (ref 40.0–80.0)
Platelets: 154 10*3/uL (ref 145–400)
RBC: 3.77 10*6/uL — ABNORMAL LOW (ref 4.20–5.70)
RDW: 13.3 % (ref 11.1–15.7)
WBC: 3.3 10*3/uL — ABNORMAL LOW (ref 4.0–10.0)

## 2016-02-23 LAB — CMP (CANCER CENTER ONLY)
ALT(SGPT): 69 U/L — ABNORMAL HIGH (ref 10–47)
AST: 37 U/L (ref 11–38)
Albumin: 3.6 g/dL (ref 3.3–5.5)
Alkaline Phosphatase: 57 U/L (ref 26–84)
BUN, Bld: 15 mg/dL (ref 7–22)
CO2: 25 mEq/L (ref 18–33)
Calcium: 9 mg/dL (ref 8.0–10.3)
Chloride: 106 mEq/L (ref 98–108)
Creat: 1.4 mg/dl — ABNORMAL HIGH (ref 0.6–1.2)
Glucose, Bld: 107 mg/dL (ref 73–118)
Potassium: 4.1 mEq/L (ref 3.3–4.7)
Sodium: 138 mEq/L (ref 128–145)
Total Bilirubin: 0.8 mg/dl (ref 0.20–1.60)
Total Protein: 6.3 g/dL — ABNORMAL LOW (ref 6.4–8.1)

## 2016-02-23 MED ORDER — BORTEZOMIB CHEMO SQ INJECTION 3.5 MG (2.5MG/ML)
1.3000 mg/m2 | Freq: Once | INTRAMUSCULAR | Status: AC
  Administered 2016-02-23: 2.75 mg via SUBCUTANEOUS
  Filled 2016-02-23: qty 1.1

## 2016-02-23 NOTE — Patient Instructions (Signed)
Bortezomib injection What is this medicine? BORTEZOMIB (bor TEZ oh mib) is a medicine that targets proteins in cancer cells and stops the cancer cells from growing. It is used to treat multiple myeloma and mantle-cell lymphoma. This medicine may be used for other purposes; ask your health care provider or pharmacist if you have questions. What should I tell my health care provider before I take this medicine? They need to know if you have any of these conditions: -diabetes -heart disease -irregular heartbeat -liver disease -on hemodialysis -low blood counts, like low white blood cells, platelets, or hemoglobin -peripheral neuropathy -taking medicine for blood pressure -an unusual or allergic reaction to bortezomib, mannitol, boron, other medicines, foods, dyes, or preservatives -pregnant or trying to get pregnant -breast-feeding How should I use this medicine? This medicine is for injection into a vein or for injection under the skin. It is given by a health care professional in a hospital or clinic setting. Talk to your pediatrician regarding the use of this medicine in children. Special care may be needed. Overdosage: If you think you have taken too much of this medicine contact a poison control center or emergency room at once. NOTE: This medicine is only for you. Do not share this medicine with others. What if I miss a dose? It is important not to miss your dose. Call your doctor or health care professional if you are unable to keep an appointment. What may interact with this medicine? This medicine may interact with the following medications: -ketoconazole -rifampin -ritonavir -St. John's Wort This list may not describe all possible interactions. Give your health care provider a list of all the medicines, herbs, non-prescription drugs, or dietary supplements you use. Also tell them if you smoke, drink alcohol, or use illegal drugs. Some items may interact with your medicine. What  should I watch for while using this medicine? Visit your doctor for checks on your progress. This drug may make you feel generally unwell. This is not uncommon, as chemotherapy can affect healthy cells as well as cancer cells. Report any side effects. Continue your course of treatment even though you feel ill unless your doctor tells you to stop. You may get drowsy or dizzy. Do not drive, use machinery, or do anything that needs mental alertness until you know how this medicine affects you. Do not stand or sit up quickly, especially if you are an older patient. This reduces the risk of dizzy or fainting spells. In some cases, you may be given additional medicines to help with side effects. Follow all directions for their use. Call your doctor or health care professional for advice if you get a fever, chills or sore throat, or other symptoms of a cold or flu. Do not treat yourself. This drug decreases your body's ability to fight infections. Try to avoid being around people who are sick. This medicine may increase your risk to bruise or bleed. Call your doctor or health care professional if you notice any unusual bleeding. You may need blood work done while you are taking this medicine. In some patients, this medicine may cause a serious brain infection that may cause death. If you have any problems seeing, thinking, speaking, walking, or standing, tell your doctor right away. If you cannot reach your doctor, urgently seek other source of medical care. Do not become pregnant while taking this medicine. Women should inform their doctor if they wish to become pregnant or think they might be pregnant. There is a potential for serious  side effects to an unborn child. Talk to your health care professional or pharmacist for more information. Do not breast-feed an infant while taking this medicine. Check with your doctor or health care professional if you get an attack of severe diarrhea, nausea and vomiting, or if  you sweat a lot. The loss of too much body fluid can make it dangerous for you to take this medicine. What side effects may I notice from receiving this medicine? Side effects that you should report to your doctor or health care professional as soon as possible: -allergic reactions like skin rash, itching or hives, swelling of the face, lips, or tongue -breathing problems -changes in hearing -changes in vision -fast, irregular heartbeat -feeling faint or lightheaded, falls -pain, tingling, numbness in the hands or feet -right upper belly pain -seizures -swelling of the ankles, feet, hands -unusual bleeding or bruising -unusually weak or tired -vomiting -yellowing of the eyes or skin Side effects that usually do not require medical attention (report to your doctor or health care professional if they continue or are bothersome): -changes in emotions or moods -constipation -diarrhea -loss of appetite -headache -irritation at site where injected -nausea This list may not describe all possible side effects. Call your doctor for medical advice about side effects. You may report side effects to FDA at 1-800-FDA-1088. Where should I keep my medicine? This drug is given in a hospital or clinic and will not be stored at home. NOTE: This sheet is a summary. It may not cover all possible information. If you have questions about this medicine, talk to your doctor, pharmacist, or health care provider.    2016, [employer] Standard. (2014-06-04 14:47:04)

## 2016-02-23 NOTE — Progress Notes (Signed)
Hematology and Oncology Follow Up Visit  Tyler Phillips [id number] 03-16-1966 50 y.o. 02/23/2016   Principle Diagnosis:   IgG Kappa myeloma  Current Therapy:  Status post autologous stem cell transplant on 05/22/2015   S/p Cycle #6 of RVD  Velcade q 2wk dosing/Revlimid 10mg  po q day (21/7) -      Interim History:  [name] is back for follow-up. He is doing fantastic. He is back at work. He is getting back into the "swing of things". It was his old job. He has not had travel yet. He has the [place] territory so he will probably have to travel to [place] or [place].  His myeloma studies do not show a monoclonal spike. His last IgG level was 774 mg/dL. His Kappa Lightchain was 14.8 mg/L.  He is working out. He is doing a lot of work outside the house.  They're getting ready for 13 peopled him over for Thanksgiving. They will have a 26 pound [place] that they will eat. He is looking forward to eating this.\  He's had no problems with bowels or bladder. He's had no rashes. He's had no leg swelling.    Currently, his performance status is ECOG 0   Medications:  Current Outpatient Prescriptions:  .  aspirin 325 MG tablet, Take 325 mg by mouth daily., Disp: , Rfl:  .  Calcium-Vitamins C & D (CALCIUM/C/D) 500-10-250 MG-MG-UNIT CHEW, Chew by mouth., Disp: , Rfl:  .  cetirizine (ZYRTEC) 10 MG tablet, Take 10 mg by mouth daily., Disp: , Rfl:  .  chlorhexidine (PERIDEX) 0.12 % solution, Use as directed 15 mLs in the mouth or throat 2 (two) times daily. , Disp: , Rfl: 0 .  Cholecalciferol (VITAMIN D3) 1000 units CAPS, Take by mouth., Disp: , Rfl:  .  clindamycin (CLEOCIN) 300 MG capsule, Take by mouth., Disp: , Rfl:  .  hydrOXYzine (ATARAX/VISTARIL) 25 MG tablet, Take 1 tablet (25 mg total) by mouth every 6 (six) hours as needed for itching., Disp: 30 tablet, Rfl: 0 .  lenalidomide (REVLIMID) 10 MG capsule, Take 1 capsule daily for 21 days on and 7 days off. Auth# [id number], Disp: 21  capsule, Rfl: 0 .  LORazepam (ATIVAN) 1 MG tablet, Take 1 mg by mouth every 4 (four) hours as needed., Disp: , Rfl:  .  oxyCODONE (OXY IR/ROXICODONE) 5 MG immediate release tablet, Take 5 mg by mouth every 4 (four) hours as needed., Disp: , Rfl:  .  penicillin v potassium (VEETID) 500 MG tablet, TAKE ONE TABLET (500 MG TOTAL) BY MOUTH EVERY 6 HOURS FOR 7 DAYS., Disp: , Rfl: 0 .  prochlorperazine (COMPAZINE) 10 MG tablet, Take 10 mg by mouth every 6 (six) hours as needed., Disp: , Rfl:  .  valACYclovir (VALTREX) 500 MG tablet, Take 250 mg by mouth 2 (two) times daily., Disp: , Rfl:   Allergies: No Known Allergies  Past Medical History, Surgical history, Social history, and Family History were reviewed and updated.  Review of Systems: As above  Physical Exam:  height is 6\' 1"  (1.854 m) and weight is 218 lb (98.9 kg). His oral temperature is 97.9 F (36.6 C). His blood pressure is 122/79 and his pulse is 62. His respiration is 16.   Wt Readings from Last 3 Encounters:  02/23/16 218 lb (98.9 kg)  02/09/16 215 lb (97.5 kg)  01/12/16 214 lb (97.1 kg)     Well-developed and well-nourished white male in no obvious distress. He has alopecia. Head  and neck exam shows no ocular or oral lesions. There is some exposed mandibular bone in the left inner mandibular table. There are no palpable cervical or supraclavicular lymph nodes. Lungs are clear. Cardiac exam regular rate and rhythm with no murmurs, rubs or bruits. Abdomen is soft. He has good bowel sounds. There is no fluid wave. There is no palpable hepatomegaly. His spleen tip is not palpable. Back exam shows no tenderness over the spine, ribs or hips. Extremities shows no clubbing, cyanosis or edema. He has good range of motion of his joints. He has good strength in his extremities. Has good pulses in his distal extremities.Marland Kitchen He may have some trace edema in his lower legs. Skin exam shows no rashes, ecchymoses or petechia.  Lab Results  Component  Value Date   WBC 3.3 (L) 02/23/2016   HGB 13.4 02/23/2016   HCT 36.3 (L) 02/23/2016   MCV 96 02/23/2016   PLT 154 02/23/2016     Chemistry      Component Value Date/Time   NA 138 02/23/2016 0942   NA 138 09/03/2015 1042   K 4.1 02/23/2016 0942   K 4.3 09/03/2015 1042   CL 106 02/23/2016 0942   CO2 25 02/23/2016 0942   CO2 25 09/03/2015 1042   BUN 15 02/23/2016 0942   BUN 21.7 09/03/2015 1042   CREATININE 1.4 (H) 02/23/2016 0942   CREATININE 1.2 09/03/2015 1042      Component Value Date/Time   CALCIUM 9.0 02/23/2016 0942   CALCIUM 9.1 09/03/2015 1042   ALKPHOS 57 02/23/2016 0942   ALKPHOS 42 09/03/2015 1042   AST 37 02/23/2016 0942   AST 27 09/03/2015 1042   ALT 69 (H) 02/23/2016 0942   ALT 41 09/03/2015 1042   BILITOT 0.80 02/23/2016 0942   BILITOT 0.54 09/03/2015 1042         Impression and Plan: [name] is 50 year old gentleman with IgG Kappa myeloma. We finally got him to transplant. This was truly a process with him. It took almost a year to get him to transplant.  Of note, his pre-transplant bone marrow was done at [facility]. It showed 10% plasma cells.  He had flow cytometry which showed an atypical monoclonal cell population. His cytogenetics did not show any obvious chromosomal abnormalities. His FISH showed a t(14:16), +4, and +17p  We have him off Zometa right now because of ONJ. I think when the FDA approves Xgeva for myeloma, we may want to consider this.  For now, we will plan to get him back For treatment in 2 weeks. I will plan to see him back in one month.[name], MD 11/6/201711:21 AM

## 2016-02-23 NOTE — Addendum Note (Signed)
Addended by: Burney Gauze R on: 02/23/2016 11:27 AM   Modules accepted: Orders

## 2016-02-24 LAB — KAPPA/LAMBDA LIGHT CHAINS
Ig Kappa Free Light Chain: 12.8 mg/L (ref 3.3–19.4)
Ig Lambda Free Light Chain: 12.2 mg/L (ref 5.7–26.3)
Kappa/Lambda FluidC Ratio: 1.05 (ref 0.26–1.65)

## 2016-02-25 LAB — MULTIPLE MYELOMA PANEL, SERUM
Albumin SerPl Elph-Mcnc: 3.5 g/dL (ref 2.9–4.4)
Albumin/Glob SerPl: 1.5 (ref 0.7–1.7)
Alpha 1: 0.2 g/dL (ref 0.0–0.4)
Alpha2 Glob SerPl Elph-Mcnc: 0.6 g/dL (ref 0.4–1.0)
B-Globulin SerPl Elph-Mcnc: 0.8 g/dL (ref 0.7–1.3)
Gamma Glob SerPl Elph-Mcnc: 0.8 g/dL (ref 0.4–1.8)
Globulin, Total: 2.4 g/dL (ref 2.2–3.9)
IgA, Qn, Serum: 66 mg/dL — ABNORMAL LOW (ref 90–386)
IgG, Qn, Serum: 792 mg/dL (ref 700–1600)
IgM, Qn, Serum: 19 mg/dL — ABNORMAL LOW (ref 20–172)
Total Protein: 5.9 g/dL — ABNORMAL LOW (ref 6.0–8.5)

## 2016-03-08 ENCOUNTER — Other Ambulatory Visit: Payer: BLUE CROSS/BLUE SHIELD

## 2016-03-08 ENCOUNTER — Ambulatory Visit: Payer: BLUE CROSS/BLUE SHIELD

## 2016-03-08 ENCOUNTER — Ambulatory Visit: Payer: BLUE CROSS/BLUE SHIELD | Admitting: Hematology & Oncology

## 2016-03-10 ENCOUNTER — Ambulatory Visit (HOSPITAL_BASED_OUTPATIENT_CLINIC_OR_DEPARTMENT_OTHER): Payer: BLUE CROSS/BLUE SHIELD | Admitting: Hematology & Oncology

## 2016-03-10 ENCOUNTER — Other Ambulatory Visit: Payer: BLUE CROSS/BLUE SHIELD

## 2016-03-10 ENCOUNTER — Ambulatory Visit (HOSPITAL_BASED_OUTPATIENT_CLINIC_OR_DEPARTMENT_OTHER): Payer: BLUE CROSS/BLUE SHIELD

## 2016-03-10 ENCOUNTER — Other Ambulatory Visit (HOSPITAL_BASED_OUTPATIENT_CLINIC_OR_DEPARTMENT_OTHER): Payer: BLUE CROSS/BLUE SHIELD

## 2016-03-10 VITALS — BP 119/85 | HR 65 | Temp 98.3°F | Resp 18 | Wt 221.0 lb

## 2016-03-10 DIAGNOSIS — C9000 Multiple myeloma not having achieved remission: Secondary | ICD-10-CM

## 2016-03-10 DIAGNOSIS — Z5112 Encounter for antineoplastic immunotherapy: Secondary | ICD-10-CM | POA: Diagnosis not present

## 2016-03-10 DIAGNOSIS — C9001 Multiple myeloma in remission: Secondary | ICD-10-CM | POA: Diagnosis not present

## 2016-03-10 LAB — CBC WITH DIFFERENTIAL (CANCER CENTER ONLY)
BASO#: 0 10*3/uL (ref 0.0–0.2)
BASO%: 0.6 % (ref 0.0–2.0)
EOS%: 3.6 % (ref 0.0–7.0)
Eosinophils Absolute: 0.1 10*3/uL (ref 0.0–0.5)
HCT: 37 % — ABNORMAL LOW (ref 38.7–49.9)
HGB: 13.5 g/dL (ref 13.0–17.1)
LYMPH#: 0.9 10*3/uL (ref 0.9–3.3)
LYMPH%: 24.4 % (ref 14.0–48.0)
MCH: 35.3 pg — ABNORMAL HIGH (ref 28.0–33.4)
MCHC: 36.5 g/dL — ABNORMAL HIGH (ref 32.0–35.9)
MCV: 97 fL (ref 82–98)
MONO#: 0.5 10*3/uL (ref 0.1–0.9)
MONO%: 13.3 % — ABNORMAL HIGH (ref 0.0–13.0)
NEUT#: 2.1 10*3/uL (ref 1.5–6.5)
NEUT%: 58.1 % (ref 40.0–80.0)
Platelets: 139 10*3/uL — ABNORMAL LOW (ref 145–400)
RBC: 3.82 10*6/uL — ABNORMAL LOW (ref 4.20–5.70)
RDW: 13.1 % (ref 11.1–15.7)
WBC: 3.6 10*3/uL — ABNORMAL LOW (ref 4.0–10.0)

## 2016-03-10 LAB — CMP (CANCER CENTER ONLY)
ALT(SGPT): 65 U/L — ABNORMAL HIGH (ref 10–47)
AST: 41 U/L — ABNORMAL HIGH (ref 11–38)
Albumin: 3.8 g/dL (ref 3.3–5.5)
Alkaline Phosphatase: 58 U/L (ref 26–84)
BUN, Bld: 14 mg/dL (ref 7–22)
CO2: 23 mEq/L (ref 18–33)
Calcium: 9 mg/dL (ref 8.0–10.3)
Chloride: 108 mEq/L (ref 98–108)
Creat: 1.3 mg/dl — ABNORMAL HIGH (ref 0.6–1.2)
Glucose, Bld: 97 mg/dL (ref 73–118)
Potassium: 3.7 mEq/L (ref 3.3–4.7)
Sodium: 139 mEq/L (ref 128–145)
Total Bilirubin: 0.9 mg/dl (ref 0.20–1.60)
Total Protein: 6.6 g/dL (ref 6.4–8.1)

## 2016-03-10 MED ORDER — BORTEZOMIB CHEMO SQ INJECTION 3.5 MG (2.5MG/ML)
1.3000 mg/m2 | Freq: Once | INTRAMUSCULAR | Status: AC
  Administered 2016-03-10: 2.75 mg via SUBCUTANEOUS
  Filled 2016-03-10: qty 2.75

## 2016-03-10 NOTE — Progress Notes (Signed)
Hematology and Oncology Follow Up Visit  Tyler Phillips [id number] [birth year] 50 y.o. 03/10/2016   Principle Diagnosis:   IgG Kappa myeloma  Current Therapy:  Status post autologous stem cell transplant on 05/22/2015   S/p Cycle #6 of RVD  Velcade q 2wk dosing/Revlimid 10mg  po q day (21/7) -      Interim History:  [name] is back for follow-up. He is doing fantastic. He has been quite busy. He just got back from [place]. He is other for a funeral. 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   He has a lot of company over for Thanksgiving. He enjoys all the company.   His myeloma has been doing fantastic. His last monoclonal spike was not observed 2 weeks ago. His IgG level was 792 mg/dL. His Kappa Lightchain was 12.8 mg/L.   He is exercising. He is staying quite busy around the house. Again he is happy that he is back to work. He is staying very busy at work.   He's had no rashes. He's had a problem with bowels or bladder. He's had no cough or shortness of breath.   Overall, his performance status is ECOG 0.  Medications:  Current Outpatient Prescriptions:  .  aspirin 325 MG tablet, Take 325 mg by mouth daily., Disp: , Rfl:  .  Calcium-Vitamins C & D (CALCIUM/C/D) 500-10-250 MG-MG-UNIT CHEW, Chew by mouth., Disp: , Rfl:  .  cetirizine (ZYRTEC) 10 MG tablet, Take 10 mg by mouth daily., Disp: , Rfl:  .  chlorhexidine (PERIDEX) 0.12 % solution, Use as directed 15 mLs in the mouth or throat 2 (two) times daily. , Disp: , Rfl: 0 .  Cholecalciferol (VITAMIN D3) 1000 units CAPS, Take by mouth., Disp: , Rfl:  .  clindamycin (CLEOCIN) 300 MG capsule, Take by mouth., Disp: , Rfl:  .  hydrOXYzine (ATARAX/VISTARIL) 25 MG tablet, Take 1 tablet (25 mg total) by mouth every 6 (six) hours as needed for itching., Disp: 30 tablet, Rfl: 0 .  lenalidomide (REVLIMID) 10 MG capsule, Take 1 capsule daily for 21 days on and 7 days off. Auth# [id number], Disp: 21 capsule, Rfl: 0 .  LORazepam  (ATIVAN) 1 MG tablet, Take 1 mg by mouth every 4 (four) hours as needed., Disp: , Rfl:  .  oxyCODONE (OXY IR/ROXICODONE) 5 MG immediate release tablet, Take 5 mg by mouth every 4 (four) hours as needed., Disp: , Rfl:  .  penicillin v potassium (VEETID) 500 MG tablet, TAKE ONE TABLET (500 MG TOTAL) BY MOUTH EVERY 6 HOURS FOR 7 DAYS., Disp: , Rfl: 0 .  prochlorperazine (COMPAZINE) 10 MG tablet, Take 10 mg by mouth every 6 (six) hours as needed., Disp: , Rfl:  .  valACYclovir (VALTREX) 500 MG tablet, Take 250 mg by mouth 2 (two) times daily., Disp: , Rfl:   Allergies: No Known Allergies  Past Medical History, Surgical history, Social history, and Family History were reviewed and updated.  Review of Systems: As above  Physical Exam:  weight is 221 lb (100.2 kg). His oral temperature is 98.3 F (36.8 C). His blood pressure is 119/85 and his pulse is 65. His respiration is 18.   Wt Readings from Last 3 Encounters:  03/10/16 221 lb (100.2 kg)  02/23/16 218 lb (98.9 kg)  02/09/16 215 lb (97.5 kg)     Well-developed and well-nourished white male in no obvious distress. He has alopecia. Head and neck exam shows no ocular or oral lesions. There  is some exposed mandibular bone in the left inner mandibular table. There are no palpable cervical or supraclavicular lymph nodes. Lungs are clear. Cardiac exam regular rate and rhythm with no murmurs, rubs or bruits. Abdomen is soft. He has good bowel sounds. There is no fluid wave. There is no palpable hepatomegaly. His spleen tip is not palpable. Back exam shows no tenderness over the spine, ribs or hips. Extremities shows no clubbing, cyanosis or edema. He has good range of motion of his joints. He has good strength in his extremities. Has good pulses in his distal extremities.Marland Kitchen He may have some trace edema in his lower legs. Skin exam shows no rashes, ecchymoses or petechia.  Lab Results  Component Value Date   WBC 3.6 (L) 03/10/2016   HGB 13.5  03/10/2016   HCT 37.0 (L) 03/10/2016   MCV 97 03/10/2016   PLT 139 (L) 03/10/2016     Chemistry      Component Value Date/Time   NA 139 03/10/2016 1148   NA 138 09/03/2015 1042   K 3.7 03/10/2016 1148   K 4.3 09/03/2015 1042   CL 108 03/10/2016 1148   CO2 23 03/10/2016 1148   CO2 25 09/03/2015 1042   BUN 14 03/10/2016 1148   BUN 21.7 09/03/2015 1042   CREATININE 1.3 (H) 03/10/2016 1148   CREATININE 1.2 09/03/2015 1042      Component Value Date/Time   CALCIUM 9.0 03/10/2016 1148   CALCIUM 9.1 09/03/2015 1042   ALKPHOS 58 03/10/2016 1148   ALKPHOS 42 09/03/2015 1042   AST 41 (H) 03/10/2016 1148   AST 27 09/03/2015 1042   ALT 65 (H) 03/10/2016 1148   ALT 41 09/03/2015 1042   BILITOT 0.90 03/10/2016 1148   BILITOT 0.54 09/03/2015 1042         Impression and Plan: [name] is 50 year old gentleman with IgG Kappa myeloma. We finally got him to transplant. This was truly a process with him. It took almost a year to get him to transplant.  Of note, his pre-transplant bone marrow was done at [facility]. It showed 10% plasma cells.  He had flow cytometry which showed an atypical monoclonal cell population. His cytogenetics did not show any obvious chromosomal abnormalities. His FISH showed a t(14:16), +4, and +17p  We have him off Zometa right now because of ONJ. I think when the FDA approves Xgeva for myeloma, we may want to consider this.  For now, we will plan to get him back for treatment in 2 weeks. I will plan to see him back in one month.[name], MD 11/22/20175:12 PM

## 2016-03-10 NOTE — Patient Instructions (Signed)

## 2016-03-12 LAB — KAPPA/LAMBDA LIGHT CHAINS
Ig Kappa Free Light Chain: 14.8 mg/L (ref 3.3–19.4)
Ig Lambda Free Light Chain: 14.9 mg/L (ref 5.7–26.3)
Kappa/Lambda FluidC Ratio: 0.99 (ref 0.26–1.65)

## 2016-03-15 ENCOUNTER — Other Ambulatory Visit: Payer: Self-pay | Admitting: *Deleted

## 2016-03-15 ENCOUNTER — Encounter: Payer: Self-pay | Admitting: *Deleted

## 2016-03-15 DIAGNOSIS — C9002 Multiple myeloma in relapse: Secondary | ICD-10-CM

## 2016-03-15 LAB — MULTIPLE MYELOMA PANEL, SERUM
Albumin SerPl Elph-Mcnc: 4.1 g/dL (ref 2.9–4.4)
Albumin/Glob SerPl: 1.6 (ref 0.7–1.7)
Alpha 1: 0.2 g/dL (ref 0.0–0.4)
Alpha2 Glob SerPl Elph-Mcnc: 0.7 g/dL (ref 0.4–1.0)
B-Globulin SerPl Elph-Mcnc: 0.9 g/dL (ref 0.7–1.3)
Gamma Glob SerPl Elph-Mcnc: 0.9 g/dL (ref 0.4–1.8)
Globulin, Total: 2.7 g/dL (ref 2.2–3.9)
IgA, Qn, Serum: 65 mg/dL — ABNORMAL LOW (ref 90–386)
IgG, Qn, Serum: 851 mg/dL (ref 700–1600)
IgM, Qn, Serum: 19 mg/dL — ABNORMAL LOW (ref 20–172)
Total Protein: 6.8 g/dL (ref 6.0–8.5)

## 2016-03-15 MED ORDER — LENALIDOMIDE 10 MG PO CAPS: ORAL_CAPSULE | ORAL | 0 refills | Status: DC

## 2016-03-22 ENCOUNTER — Other Ambulatory Visit (HOSPITAL_BASED_OUTPATIENT_CLINIC_OR_DEPARTMENT_OTHER): Payer: BLUE CROSS/BLUE SHIELD

## 2016-03-22 ENCOUNTER — Ambulatory Visit: Payer: BLUE CROSS/BLUE SHIELD | Admitting: Hematology & Oncology

## 2016-03-22 ENCOUNTER — Ambulatory Visit (HOSPITAL_BASED_OUTPATIENT_CLINIC_OR_DEPARTMENT_OTHER): Payer: BLUE CROSS/BLUE SHIELD

## 2016-03-22 VITALS — BP 141/84 | HR 62 | Temp 98.1°F | Resp 16

## 2016-03-22 DIAGNOSIS — C9000 Multiple myeloma not having achieved remission: Secondary | ICD-10-CM | POA: Diagnosis not present

## 2016-03-22 DIAGNOSIS — C9001 Multiple myeloma in remission: Secondary | ICD-10-CM

## 2016-03-22 DIAGNOSIS — Z5112 Encounter for antineoplastic immunotherapy: Secondary | ICD-10-CM | POA: Diagnosis not present

## 2016-03-22 LAB — CBC WITH DIFFERENTIAL (CANCER CENTER ONLY)
BASO#: 0 10*3/uL (ref 0.0–0.2)
BASO%: 0.2 % (ref 0.0–2.0)
EOS%: 0.7 % (ref 0.0–7.0)
Eosinophils Absolute: 0 10*3/uL (ref 0.0–0.5)
HCT: 38.8 % (ref 38.7–49.9)
HGB: 14.1 g/dL (ref 13.0–17.1)
LYMPH#: 0.9 10*3/uL (ref 0.9–3.3)
LYMPH%: 20.6 % (ref 14.0–48.0)
MCH: 35.2 pg — ABNORMAL HIGH (ref 28.0–33.4)
MCHC: 36.3 g/dL — ABNORMAL HIGH (ref 32.0–35.9)
MCV: 97 fL (ref 82–98)
MONO#: 0.6 10*3/uL (ref 0.1–0.9)
MONO%: 13.3 % — ABNORMAL HIGH (ref 0.0–13.0)
NEUT#: 2.8 10*3/uL (ref 1.5–6.5)
NEUT%: 65.2 % (ref 40.0–80.0)
Platelets: 163 10*3/uL (ref 145–400)
RBC: 4.01 10*6/uL — ABNORMAL LOW (ref 4.20–5.70)
RDW: 13.2 % (ref 11.1–15.7)
WBC: 4.3 10*3/uL (ref 4.0–10.0)

## 2016-03-22 LAB — CMP (CANCER CENTER ONLY)
ALT(SGPT): 71 U/L — ABNORMAL HIGH (ref 10–47)
AST: 43 U/L — ABNORMAL HIGH (ref 11–38)
Albumin: 4.1 g/dL (ref 3.3–5.5)
Alkaline Phosphatase: 69 U/L (ref 26–84)
BUN, Bld: 14 mg/dL (ref 7–22)
CO2: 23 mEq/L (ref 18–33)
Calcium: 9.2 mg/dL (ref 8.0–10.3)
Chloride: 107 mEq/L (ref 98–108)
Creat: 1.4 mg/dl — ABNORMAL HIGH (ref 0.6–1.2)
Glucose, Bld: 94 mg/dL (ref 73–118)
Potassium: 4.2 mEq/L (ref 3.3–4.7)
Sodium: 141 mEq/L (ref 128–145)
Total Bilirubin: 0.8 mg/dl (ref 0.20–1.60)
Total Protein: 7.3 g/dL (ref 6.4–8.1)

## 2016-03-22 MED ORDER — BORTEZOMIB CHEMO SQ INJECTION 3.5 MG (2.5MG/ML)
1.3000 mg/m2 | Freq: Once | INTRAMUSCULAR | Status: AC
  Administered 2016-03-22: 2.75 mg via SUBCUTANEOUS
  Filled 2016-03-22: qty 1.1

## 2016-03-22 NOTE — Patient Instructions (Signed)

## 2016-04-05 ENCOUNTER — Other Ambulatory Visit (HOSPITAL_BASED_OUTPATIENT_CLINIC_OR_DEPARTMENT_OTHER): Payer: BLUE CROSS/BLUE SHIELD

## 2016-04-05 ENCOUNTER — Ambulatory Visit (HOSPITAL_BASED_OUTPATIENT_CLINIC_OR_DEPARTMENT_OTHER): Payer: BLUE CROSS/BLUE SHIELD

## 2016-04-05 ENCOUNTER — Ambulatory Visit (HOSPITAL_BASED_OUTPATIENT_CLINIC_OR_DEPARTMENT_OTHER): Payer: BLUE CROSS/BLUE SHIELD | Admitting: Hematology & Oncology

## 2016-04-05 VITALS — BP 140/95 | HR 76 | Temp 97.4°F | Wt 221.8 lb

## 2016-04-05 DIAGNOSIS — C9001 Multiple myeloma in remission: Secondary | ICD-10-CM | POA: Diagnosis not present

## 2016-04-05 DIAGNOSIS — Z5112 Encounter for antineoplastic immunotherapy: Secondary | ICD-10-CM | POA: Diagnosis not present

## 2016-04-05 DIAGNOSIS — Z9484 Stem cells transplant status: Secondary | ICD-10-CM | POA: Diagnosis not present

## 2016-04-05 DIAGNOSIS — R7989 Other specified abnormal findings of blood chemistry: Secondary | ICD-10-CM

## 2016-04-05 LAB — CBC WITH DIFFERENTIAL (CANCER CENTER ONLY)
BASO#: 0 10*3/uL (ref 0.0–0.2)
BASO%: 0.4 % (ref 0.0–2.0)
EOS%: 1.5 % (ref 0.0–7.0)
Eosinophils Absolute: 0.1 10*3/uL (ref 0.0–0.5)
HCT: 36.7 % — ABNORMAL LOW (ref 38.7–49.9)
HGB: 13.2 g/dL (ref 13.0–17.1)
LYMPH#: 0.8 10*3/uL — ABNORMAL LOW (ref 0.9–3.3)
LYMPH%: 14.4 % (ref 14.0–48.0)
MCH: 35.1 pg — ABNORMAL HIGH (ref 28.0–33.4)
MCHC: 36 g/dL — ABNORMAL HIGH (ref 32.0–35.9)
MCV: 98 fL (ref 82–98)
MONO#: 0.7 10*3/uL (ref 0.1–0.9)
MONO%: 12 % (ref 0.0–13.0)
NEUT#: 3.9 10*3/uL (ref 1.5–6.5)
NEUT%: 71.7 % (ref 40.0–80.0)
Platelets: 147 10*3/uL (ref 145–400)
RBC: 3.76 10*6/uL — ABNORMAL LOW (ref 4.20–5.70)
RDW: 13.1 % (ref 11.1–15.7)
WBC: 5.5 10*3/uL (ref 4.0–10.0)

## 2016-04-05 LAB — CMP (CANCER CENTER ONLY)
ALT(SGPT): 111 U/L — ABNORMAL HIGH (ref 10–47)
AST: 80 U/L — ABNORMAL HIGH (ref 11–38)
Albumin: 3.7 g/dL (ref 3.3–5.5)
Alkaline Phosphatase: 61 U/L (ref 26–84)
BUN, Bld: 14 mg/dL (ref 7–22)
CO2: 26 mEq/L (ref 18–33)
Calcium: 8.6 mg/dL (ref 8.0–10.3)
Chloride: 105 mEq/L (ref 98–108)
Creat: 1.3 mg/dl — ABNORMAL HIGH (ref 0.6–1.2)
Glucose, Bld: 115 mg/dL (ref 73–118)
Potassium: 3.8 mEq/L (ref 3.3–4.7)
Sodium: 136 mEq/L (ref 128–145)
Total Bilirubin: 0.8 mg/dl (ref 0.20–1.60)
Total Protein: 6.6 g/dL (ref 6.4–8.1)

## 2016-04-05 MED ORDER — BORTEZOMIB CHEMO SQ INJECTION 3.5 MG (2.5MG/ML)
1.3000 mg/m2 | Freq: Once | INTRAMUSCULAR | Status: AC
  Administered 2016-04-05: 2.75 mg via SUBCUTANEOUS
  Filled 2016-04-05: qty 2.75

## 2016-04-05 NOTE — Progress Notes (Signed)
OK to treat today with elevated liver enzymes per Babs Bertin per MD Ennever.

## 2016-04-05 NOTE — Progress Notes (Signed)
Hematology and Oncology Follow Up Visit  Broch Pelcher [id number] 1965/07/10 50 y.o. 04/05/2016   Principle Diagnosis:   IgG Kappa myeloma  Current Therapy:  Status post autologous stem cell transplant on 05/22/2015   S/p Cycle #6 of RVD  Velcade q 2wk dosing/Revlimid 10mg  po q day (21/7) -      Interim History:  [name] is back for follow-up. He is doing fantastic. He has been quite busy. He has a closing on his beach house in [place] today. He also is going to close on a farm. He would like to go back up to [place] but is not sure when he has time.   He has a lot of company over for Thanksgiving. He enjoyed all the company.   His myeloma has been doing fantastic. His last monoclonal spike was not observed 2 weeks ago. His IgG level was 851 mg/dL. His Kappa Lightchain was 14.8 mg/L.   He is exercising. He is staying quite busy around the house. Again he is happy that he is back to work. He is staying very busy at work.   He's had no rashes. He's had a problem with bowels or bladder. He's had no cough or shortness of breath.   Overall, his performance status is ECOG 0.  Medications:  Current Outpatient Prescriptions:  .  aspirin 325 MG tablet, Take 325 mg by mouth daily., Disp: , Rfl:  .  Calcium-Vitamins C & D (CALCIUM/C/D) 500-10-250 MG-MG-UNIT CHEW, Chew by mouth., Disp: , Rfl:  .  cetirizine (ZYRTEC) 10 MG tablet, Take 10 mg by mouth daily., Disp: , Rfl:  .  chlorhexidine (PERIDEX) 0.12 % solution, Use as directed 15 mLs in the mouth or throat 2 (two) times daily. , Disp: , Rfl: 0 .  Cholecalciferol (VITAMIN D3) 1000 units CAPS, Take by mouth., Disp: , Rfl:  .  clindamycin (CLEOCIN) 300 MG capsule, Take by mouth., Disp: , Rfl:  .  hydrOXYzine (ATARAX/VISTARIL) 25 MG tablet, Take 1 tablet (25 mg total) by mouth every 6 (six) hours as needed for itching., Disp: 30 tablet, Rfl: 0 .  lenalidomide (REVLIMID) 10 MG capsule, Take 1 capsule daily for 21 days on and 7 days  off. [id number], Disp: 21 capsule, Rfl: 0 .  LORazepam (ATIVAN) 1 MG tablet, Take 1 mg by mouth every 4 (four) hours as needed., Disp: , Rfl:  .  oxyCODONE (OXY IR/ROXICODONE) 5 MG immediate release tablet, Take 5 mg by mouth every 4 (four) hours as needed., Disp: , Rfl:  .  penicillin v potassium (VEETID) 500 MG tablet, TAKE ONE TABLET (500 MG TOTAL) BY MOUTH EVERY 6 HOURS FOR 7 DAYS., Disp: , Rfl: 0 .  prochlorperazine (COMPAZINE) 10 MG tablet, Take 10 mg by mouth every 6 (six) hours as needed., Disp: , Rfl:  .  valACYclovir (VALTREX) 500 MG tablet, Take 250 mg by mouth 2 (two) times daily., Disp: , Rfl:  No current facility-administered medications for this visit.   Facility-Administered Medications Ordered in Other Visits:  .  bortezomib SQ (VELCADE) chemo injection 2.75 mg, 1.3 mg/m2 (Treatment Plan Recorded), Subcutaneous, Once, [name], MD  Allergies: No Known Allergies  Past Medical History, Surgical history, Social history, and Family History were reviewed and updated.  Review of Systems: As above  Physical Exam:  weight is 221 lb 12.8 oz (100.6 kg). His oral temperature is 97.4 F (36.3 C). His blood pressure is 140/95 (abnormal) and his pulse is 76.   Wt  Readings from Last 3 Encounters:  04/05/16 221 lb 12.8 oz (100.6 kg)  03/10/16 221 lb (100.2 kg)  02/23/16 218 lb (98.9 kg)     Well-developed and well-nourished white male in no obvious distress. He has alopecia. Head and neck exam shows no ocular or oral lesions. There is some exposed mandibular bone in the left inner mandibular table. There are no palpable cervical or supraclavicular lymph nodes. Lungs are clear. Cardiac exam regular rate and rhythm with no murmurs, rubs or bruits. Abdomen is soft. He has good bowel sounds. There is no fluid wave. There is no palpable hepatomegaly. His spleen tip is not palpable. Back exam shows no tenderness over the spine, ribs or hips. Extremities shows no clubbing, cyanosis or  edema. He has good range of motion of his joints. He has good strength in his extremities. Has good pulses in his distal extremities.Marland Kitchen He may have some trace edema in his lower legs. Skin exam shows no rashes, ecchymoses or petechia.  Lab Results  Component Value Date   WBC 5.5 04/05/2016   HGB 13.2 04/05/2016   HCT 36.7 (L) 04/05/2016   MCV 98 04/05/2016   PLT 147 04/05/2016     Chemistry      Component Value Date/Time   NA 136 04/05/2016 1019   NA 138 09/03/2015 1042   K 3.8 04/05/2016 1019   K 4.3 09/03/2015 1042   CL 105 04/05/2016 1019   CO2 26 04/05/2016 1019   CO2 25 09/03/2015 1042   BUN 14 04/05/2016 1019   BUN 21.7 09/03/2015 1042   CREATININE 1.3 (H) 04/05/2016 1019   CREATININE 1.2 09/03/2015 1042      Component Value Date/Time   CALCIUM 8.6 04/05/2016 1019   CALCIUM 9.1 09/03/2015 1042   ALKPHOS 61 04/05/2016 1019   ALKPHOS 42 09/03/2015 1042   AST 80 (H) 04/05/2016 1019   AST 27 09/03/2015 1042   ALT 111 (H) 04/05/2016 1019   ALT 41 09/03/2015 1042   BILITOT 0.80 04/05/2016 1019   BILITOT 0.54 09/03/2015 1042         Impression and Plan: [name] is 50 year old gentleman with IgG Kappa myeloma. We finally got him to transplant. This was truly a process with him. It took almost a year to get him to transplant.  Of note, his pre-transplant bone marrow was done at [facility]. It showed 10% plasma cells.  He had flow cytometry which showed an atypical monoclonal cell population. His cytogenetics did not show any obvious chromosomal abnormalities. His FISH showed a t(14:16), +4, and +17p  I noticed that his liver tests were slightly on the higher side. This might be from the Revlimid. It might be from the holidays. We will hold his Revlimid for right now. This might be causing his liver test to go up a little bit. I told him that he really cannot drink any alcohol over the holidays.  We will have him come back in 2 weeks for labs and Velcade.  I want to see him  back in one month. If his liver function tests have normalize, we may want to get him back on Revlimid.    [name], MD 12/18/201712:44 PM

## 2016-04-05 NOTE — Patient Instructions (Signed)

## 2016-04-06 LAB — KAPPA/LAMBDA LIGHT CHAINS
Ig Kappa Free Light Chain: 18.4 mg/L (ref 3.3–19.4)
Ig Lambda Free Light Chain: 16 mg/L (ref 5.7–26.3)
Kappa/Lambda FluidC Ratio: 1.15 (ref 0.26–1.65)

## 2016-04-06 LAB — IGG, IGA, IGM
IgA, Qn, Serum: 75 mg/dL — ABNORMAL LOW (ref 90–386)
IgG, Qn, Serum: 842 mg/dL (ref 700–1600)
IgM, Qn, Serum: 18 mg/dL — ABNORMAL LOW (ref 20–172)

## 2016-04-08 LAB — PROTEIN ELECTROPHORESIS, SERUM, WITH REFLEX
A/G Ratio: 1.6 (ref 0.7–1.7)
Albumin: 3.9 g/dL (ref 2.9–4.4)
Alpha 1: 0.2 g/dL (ref 0.0–0.4)
Alpha 2: 0.6 g/dL (ref 0.4–1.0)
Beta: 0.8 g/dL (ref 0.7–1.3)
Gamma Globulin: 0.9 g/dL (ref 0.4–1.8)
Globulin, Total: 2.5 g/dL (ref 2.2–3.9)
Total Protein: 6.4 g/dL (ref 6.0–8.5)

## 2016-04-13 ENCOUNTER — Other Ambulatory Visit: Payer: Self-pay | Admitting: *Deleted

## 2016-04-13 DIAGNOSIS — C9002 Multiple myeloma in relapse: Secondary | ICD-10-CM

## 2016-04-20 ENCOUNTER — Other Ambulatory Visit (HOSPITAL_BASED_OUTPATIENT_CLINIC_OR_DEPARTMENT_OTHER): Payer: BLUE CROSS/BLUE SHIELD

## 2016-04-20 ENCOUNTER — Ambulatory Visit (HOSPITAL_BASED_OUTPATIENT_CLINIC_OR_DEPARTMENT_OTHER): Payer: BLUE CROSS/BLUE SHIELD

## 2016-04-20 ENCOUNTER — Ambulatory Visit (HOSPITAL_BASED_OUTPATIENT_CLINIC_OR_DEPARTMENT_OTHER): Payer: BLUE CROSS/BLUE SHIELD | Admitting: Hematology & Oncology

## 2016-04-20 ENCOUNTER — Encounter: Payer: Self-pay | Admitting: Hematology & Oncology

## 2016-04-20 VITALS — BP 135/90 | HR 69 | Temp 97.6°F | Wt 222.2 lb

## 2016-04-20 DIAGNOSIS — C9000 Multiple myeloma not having achieved remission: Secondary | ICD-10-CM

## 2016-04-20 DIAGNOSIS — Z5112 Encounter for antineoplastic immunotherapy: Secondary | ICD-10-CM | POA: Diagnosis not present

## 2016-04-20 DIAGNOSIS — C9001 Multiple myeloma in remission: Secondary | ICD-10-CM | POA: Diagnosis not present

## 2016-04-20 LAB — CBC WITH DIFFERENTIAL (CANCER CENTER ONLY)
BASO#: 0 10*3/uL (ref 0.0–0.2)
BASO%: 0.2 % (ref 0.0–2.0)
EOS%: 0.4 % (ref 0.0–7.0)
Eosinophils Absolute: 0 10*3/uL (ref 0.0–0.5)
HCT: 37.5 % — ABNORMAL LOW (ref 38.7–49.9)
HGB: 13.6 g/dL (ref 13.0–17.1)
LYMPH#: 0.8 10*3/uL — ABNORMAL LOW (ref 0.9–3.3)
LYMPH%: 14.3 % (ref 14.0–48.0)
MCH: 35.3 pg — ABNORMAL HIGH (ref 28.0–33.4)
MCHC: 36.3 g/dL — ABNORMAL HIGH (ref 32.0–35.9)
MCV: 97 fL (ref 82–98)
MONO#: 0.4 10*3/uL (ref 0.1–0.9)
MONO%: 6.6 % (ref 0.0–13.0)
NEUT#: 4.3 10*3/uL (ref 1.5–6.5)
NEUT%: 78.5 % (ref 40.0–80.0)
Platelets: 171 10*3/uL (ref 145–400)
RBC: 3.85 10*6/uL — ABNORMAL LOW (ref 4.20–5.70)
RDW: 13.4 % (ref 11.1–15.7)
WBC: 5.5 10*3/uL (ref 4.0–10.0)

## 2016-04-20 LAB — CMP (CANCER CENTER ONLY)
ALT(SGPT): 61 U/L — ABNORMAL HIGH (ref 10–47)
AST: 44 U/L — ABNORMAL HIGH (ref 11–38)
Albumin: 4 g/dL (ref 3.3–5.5)
Alkaline Phosphatase: 57 U/L (ref 26–84)
BUN, Bld: 22 mg/dL (ref 7–22)
CO2: 26 mEq/L (ref 18–33)
Calcium: 9 mg/dL (ref 8.0–10.3)
Chloride: 109 mEq/L — ABNORMAL HIGH (ref 98–108)
Creat: 1.6 mg/dl — ABNORMAL HIGH (ref 0.6–1.2)
Glucose, Bld: 92 mg/dL (ref 73–118)
Potassium: 4.5 mEq/L (ref 3.3–4.7)
Sodium: 143 mEq/L (ref 128–145)
Total Bilirubin: 0.8 mg/dl (ref 0.20–1.60)
Total Protein: 7.2 g/dL (ref 6.4–8.1)

## 2016-04-20 MED ORDER — BORTEZOMIB CHEMO SQ INJECTION 3.5 MG (2.5MG/ML)
1.3000 mg/m2 | Freq: Once | INTRAMUSCULAR | Status: AC
  Administered 2016-04-20: 2.75 mg via SUBCUTANEOUS
  Filled 2016-04-20: qty 2.75

## 2016-04-20 NOTE — Patient Instructions (Signed)
[facility] Discharge Instructions for Patients Receiving Chemotherapy  Today you received the following chemotherapy agents Velcade. To help prevent nausea and vomiting after your treatment, we encourage you to take your nausea medication as directed.  If you develop nausea and vomiting that is not controlled by your nausea medication, call the clinic.   BELOW ARE SYMPTOMS THAT SHOULD BE REPORTED IMMEDIATELY:  *FEVER GREATER THAN 100.5 F  *CHILLS WITH OR WITHOUT FEVER  NAUSEA AND VOMITING THAT IS NOT CONTROLLED WITH YOUR NAUSEA MEDICATION  *UNUSUAL SHORTNESS OF BREATH  *UNUSUAL BRUISING OR BLEEDING  TENDERNESS IN MOUTH AND THROAT WITH OR WITHOUT PRESENCE OF ULCERS  *URINARY PROBLEMS  *BOWEL PROBLEMS  UNUSUAL RASH Items with * indicate a potential emergency and should be followed up as soon as possible.  Feel free to call the clinic you have any questions or concerns. The clinic phone number is [phone].  Please show the CHEMO ALERT CARD at check-in to the Emergency Department and triage nurse.    

## 2016-04-20 NOTE — Progress Notes (Signed)
OK to treat with Creatinine 1.6 today per MD [name]

## 2016-04-20 NOTE — Progress Notes (Signed)
Hematology and Oncology Follow Up Visit  Tyler Phillips:[id number] [birth year] 51 y.o. 04/20/2016   Principle Diagnosis:   IgG Kappa myeloma  Current Therapy:  Status post autologous stem cell transplant on 05/22/2015   S/p Cycle #6 of RVD  Velcade q 2wk dosing/Revlimid 10mg  po q day (21/7) -      Interim History:  [name] is back for follow-up. He is doing fantastic. He has been quite busy. He had a very good Christmas and New Year's. He actually mood one of his daughters up to [place]. She is working for an Engineer, structural.Marland Kitchen  He is working full-time. Thankfully, he is not traveling.  He is working out quite a bit.  When we last saw him his liver function tests were elevated. I told him to hold the Revlimid. I told him not to drink alcohol. His liver tests have come back down. We will restart the Revlimid.   He has had no fever. He has had no bleeding. He has had no problems with nausea or vomiting. He has had no change in bowel or bladder habits.   Overall, his performance status is ECOG 0.  Medications:  Current Outpatient Prescriptions:  .  aspirin 325 MG tablet, Take 325 mg by mouth daily., Disp: , Rfl:  .  Calcium-Vitamins C & D (CALCIUM/C/D) 500-10-250 MG-MG-UNIT CHEW, Chew by mouth., Disp: , Rfl:  .  cetirizine (ZYRTEC) 10 MG tablet, Take 10 mg by mouth daily., Disp: , Rfl:  .  chlorhexidine (PERIDEX) 0.12 % solution, Use as directed 15 mLs in the mouth or throat 2 (two) times daily. , Disp: , Rfl: 0 .  Cholecalciferol (VITAMIN D3) 1000 units CAPS, Take by mouth., Disp: , Rfl:  .  clindamycin (CLEOCIN) 300 MG capsule, Take by mouth., Disp: , Rfl:  .  hydrOXYzine (ATARAX/VISTARIL) 25 MG tablet, Take 1 tablet (25 mg total) by mouth every 6 (six) hours as needed for itching., Disp: 30 tablet, Rfl: 0 .  lenalidomide (REVLIMID) 10 MG capsule, Take 1 capsule daily for 21 days on and 7 days off. [id number], Disp: 21 capsule, Rfl: 0 .  LORazepam (ATIVAN) 1 MG tablet, Take 1 mg by  mouth every 4 (four) hours as needed., Disp: , Rfl:  .  oxyCODONE (OXY IR/ROXICODONE) 5 MG immediate release tablet, Take 5 mg by mouth every 4 (four) hours as needed., Disp: , Rfl:  .  penicillin v potassium (VEETID) 500 MG tablet, TAKE ONE TABLET (500 MG TOTAL) BY MOUTH EVERY 6 HOURS FOR 7 DAYS., Disp: , Rfl: 0 .  prochlorperazine (COMPAZINE) 10 MG tablet, Take 10 mg by mouth every 6 (six) hours as needed., Disp: , Rfl:  .  valACYclovir (VALTREX) 500 MG tablet, Take 250 mg by mouth 2 (two) times daily., Disp: , Rfl:   Allergies: No Known Allergies  Past Medical History, Surgical history, Social history, and Family History were reviewed and updated.  Review of Systems: As above  Physical Exam:  weight is 222 lb 4 oz (100.8 kg). His oral temperature is 97.6 F (36.4 C). His blood pressure is 135/90 and his pulse is 69.   Wt Readings from Last 3 Encounters:  04/20/16 222 lb 4 oz (100.8 kg)  04/05/16 221 lb 12.8 oz (100.6 kg)  03/10/16 221 lb (100.2 kg)     Well-developed and well-nourished white male in no obvious distress. He has alopecia. Head and neck exam shows no ocular or oral lesions. There is some exposed mandibular bone in the  left inner mandibular table. There are no palpable cervical or supraclavicular lymph nodes. Lungs are clear. Cardiac exam regular rate and rhythm with no murmurs, rubs or bruits. Abdomen is soft. He has good bowel sounds. There is no fluid wave. There is no palpable hepatomegaly. His spleen tip is not palpable. Back exam shows no tenderness over the spine, ribs or hips. Extremities shows no clubbing, cyanosis or edema. He has good range of motion of his joints. He has good strength in his extremities. Has good pulses in his distal extremities.Marland Kitchen He may have some trace edema in his lower legs. Skin exam shows no rashes, ecchymoses or petechia.  Lab Results  Component Value Date   WBC 5.5 04/20/2016   HGB 13.6 04/20/2016   HCT 37.5 (L) 04/20/2016   MCV 97  04/20/2016   PLT 171 04/20/2016     Chemistry      Component Value Date/Time   NA 136 04/05/2016 1019   NA 138 09/03/2015 1042   K 3.8 04/05/2016 1019   K 4.3 09/03/2015 1042   CL 105 04/05/2016 1019   CO2 26 04/05/2016 1019   CO2 25 09/03/2015 1042   BUN 14 04/05/2016 1019   BUN 21.7 09/03/2015 1042   CREATININE 1.3 (H) 04/05/2016 1019   CREATININE 1.2 09/03/2015 1042      Component Value Date/Time   CALCIUM 8.6 04/05/2016 1019   CALCIUM 9.1 09/03/2015 1042   ALKPHOS 61 04/05/2016 1019   ALKPHOS 42 09/03/2015 1042   AST 80 (H) 04/05/2016 1019   AST 27 09/03/2015 1042   ALT 111 (H) 04/05/2016 1019   ALT 41 09/03/2015 1042   BILITOT 0.80 04/05/2016 1019   BILITOT 0.54 09/03/2015 1042         Impression and Plan: [name] is 51 year old gentleman with IgG Kappa myeloma. We finally got him to transplant. This was truly a process with him. It took almost a year to get him to transplant.  Of note, his pre-transplant bone marrow was done at [facility]. It showed 10% plasma cells.  He had flow cytometry which showed an atypical monoclonal cell population. His cytogenetics did not show any obvious chromosomal abnormalities. His FISH showed a t(14:16), +4, and +17p  We will go ahead and get him back on to the Revlimid. I don't think there is a problem with this.  We will continue him on the Velcade every 2 weeks.  I will plan to see him back in another month.  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     [name], MD 1/2/20189:41 AM

## 2016-04-21 ENCOUNTER — Other Ambulatory Visit: Payer: Self-pay | Admitting: *Deleted

## 2016-04-21 DIAGNOSIS — C9002 Multiple myeloma in relapse: Secondary | ICD-10-CM

## 2016-04-21 LAB — IGG, IGA, IGM
IgA, Qn, Serum: 84 mg/dL — ABNORMAL LOW (ref 90–386)
IgG, Qn, Serum: 934 mg/dL (ref 700–1600)
IgM, Qn, Serum: 18 mg/dL — ABNORMAL LOW (ref 20–172)

## 2016-04-21 LAB — KAPPA/LAMBDA LIGHT CHAINS
Ig Kappa Free Light Chain: 13.2 mg/L (ref 3.3–19.4)
Ig Lambda Free Light Chain: 14.6 mg/L (ref 5.7–26.3)
Kappa/Lambda FluidC Ratio: 0.9 (ref 0.26–1.65)

## 2016-04-21 MED ORDER — LENALIDOMIDE 10 MG PO CAPS: ORAL_CAPSULE | ORAL | 0 refills | Status: DC

## 2016-04-22 LAB — PROTEIN ELECTROPHORESIS, SERUM, WITH REFLEX
A/G Ratio: 1.4 (ref 0.7–1.7)
Albumin: 3.9 g/dL (ref 2.9–4.4)
Alpha 1: 0.2 g/dL (ref 0.0–0.4)
Alpha 2: 0.6 g/dL (ref 0.4–1.0)
Beta: 0.9 g/dL (ref 0.7–1.3)
Gamma Globulin: 1 g/dL (ref 0.4–1.8)
Globulin, Total: 2.7 g/dL (ref 2.2–3.9)
Total Protein: 6.6 g/dL (ref 6.0–8.5)

## 2016-05-03 ENCOUNTER — Ambulatory Visit: Payer: BLUE CROSS/BLUE SHIELD | Admitting: Hematology & Oncology

## 2016-05-03 ENCOUNTER — Ambulatory Visit (HOSPITAL_BASED_OUTPATIENT_CLINIC_OR_DEPARTMENT_OTHER): Payer: BLUE CROSS/BLUE SHIELD

## 2016-05-03 ENCOUNTER — Other Ambulatory Visit (HOSPITAL_BASED_OUTPATIENT_CLINIC_OR_DEPARTMENT_OTHER): Payer: BLUE CROSS/BLUE SHIELD

## 2016-05-03 VITALS — BP 142/86 | HR 56 | Temp 98.2°F

## 2016-05-03 DIAGNOSIS — Z5112 Encounter for antineoplastic immunotherapy: Secondary | ICD-10-CM | POA: Diagnosis not present

## 2016-05-03 DIAGNOSIS — C9000 Multiple myeloma not having achieved remission: Secondary | ICD-10-CM

## 2016-05-03 DIAGNOSIS — C9001 Multiple myeloma in remission: Secondary | ICD-10-CM

## 2016-05-03 LAB — CMP (CANCER CENTER ONLY)
ALT(SGPT): 79 U/L — ABNORMAL HIGH (ref 10–47)
AST: 41 U/L — ABNORMAL HIGH (ref 11–38)
Albumin: 4 g/dL (ref 3.3–5.5)
Alkaline Phosphatase: 57 U/L (ref 26–84)
BUN, Bld: 15 mg/dL (ref 7–22)
CO2: 27 mEq/L (ref 18–33)
Calcium: 8.8 mg/dL (ref 8.0–10.3)
Chloride: 103 mEq/L (ref 98–108)
Creat: 1.4 mg/dl — ABNORMAL HIGH (ref 0.6–1.2)
Glucose, Bld: 108 mg/dL (ref 73–118)
Potassium: 4.2 mEq/L (ref 3.3–4.7)
Sodium: 140 mEq/L (ref 128–145)
Total Bilirubin: 0.8 mg/dl (ref 0.20–1.60)
Total Protein: 6.8 g/dL (ref 6.4–8.1)

## 2016-05-03 LAB — CBC WITH DIFFERENTIAL (CANCER CENTER ONLY)
BASO#: 0 10*3/uL (ref 0.0–0.2)
BASO%: 0.2 % (ref 0.0–2.0)
EOS%: 2.3 % (ref 0.0–7.0)
Eosinophils Absolute: 0.1 10*3/uL (ref 0.0–0.5)
HCT: 37.3 % — ABNORMAL LOW (ref 38.7–49.9)
HGB: 13.4 g/dL (ref 13.0–17.1)
LYMPH#: 0.8 10*3/uL — ABNORMAL LOW (ref 0.9–3.3)
LYMPH%: 16.9 % (ref 14.0–48.0)
MCH: 35.6 pg — ABNORMAL HIGH (ref 28.0–33.4)
MCHC: 35.9 g/dL (ref 32.0–35.9)
MCV: 99 fL — ABNORMAL HIGH (ref 82–98)
MONO#: 0.6 10*3/uL (ref 0.1–0.9)
MONO%: 11.7 % (ref 0.0–13.0)
NEUT#: 3.3 10*3/uL (ref 1.5–6.5)
NEUT%: 68.9 % (ref 40.0–80.0)
Platelets: 124 10*3/uL — ABNORMAL LOW (ref 145–400)
RBC: 3.76 10*6/uL — ABNORMAL LOW (ref 4.20–5.70)
RDW: 13.3 % (ref 11.1–15.7)
WBC: 4.8 10*3/uL (ref 4.0–10.0)

## 2016-05-03 MED ORDER — BORTEZOMIB CHEMO SQ INJECTION 3.5 MG (2.5MG/ML)
1.3000 mg/m2 | Freq: Once | INTRAMUSCULAR | Status: AC
  Administered 2016-05-03: 2.75 mg via SUBCUTANEOUS
  Filled 2016-05-03: qty 2.75

## 2016-05-03 NOTE — Patient Instructions (Signed)

## 2016-05-11 ENCOUNTER — Other Ambulatory Visit: Payer: Self-pay | Admitting: *Deleted

## 2016-05-11 DIAGNOSIS — C9002 Multiple myeloma in relapse: Secondary | ICD-10-CM

## 2016-05-11 MED ORDER — LENALIDOMIDE 10 MG PO CAPS: ORAL_CAPSULE | ORAL | 0 refills | Status: DC

## 2016-05-17 ENCOUNTER — Other Ambulatory Visit: Payer: BLUE CROSS/BLUE SHIELD

## 2016-05-17 ENCOUNTER — Ambulatory Visit: Payer: BLUE CROSS/BLUE SHIELD | Admitting: Hematology & Oncology

## 2016-05-17 ENCOUNTER — Ambulatory Visit: Payer: BLUE CROSS/BLUE SHIELD

## 2016-05-18 DIAGNOSIS — H938X3 Other specified disorders of ear, bilateral: Secondary | ICD-10-CM | POA: Diagnosis not present

## 2016-05-18 DIAGNOSIS — H9193 Unspecified hearing loss, bilateral: Secondary | ICD-10-CM | POA: Diagnosis not present

## 2016-05-19 DIAGNOSIS — C9000 Multiple myeloma not having achieved remission: Secondary | ICD-10-CM | POA: Diagnosis not present

## 2016-05-20 ENCOUNTER — Other Ambulatory Visit: Payer: Self-pay | Admitting: *Deleted

## 2016-05-20 DIAGNOSIS — C9001 Multiple myeloma in remission: Secondary | ICD-10-CM

## 2016-05-21 ENCOUNTER — Ambulatory Visit (HOSPITAL_BASED_OUTPATIENT_CLINIC_OR_DEPARTMENT_OTHER): Payer: BLUE CROSS/BLUE SHIELD

## 2016-05-21 ENCOUNTER — Other Ambulatory Visit (HOSPITAL_BASED_OUTPATIENT_CLINIC_OR_DEPARTMENT_OTHER): Payer: BLUE CROSS/BLUE SHIELD

## 2016-05-21 VITALS — BP 140/72 | HR 65 | Temp 97.7°F | Resp 18

## 2016-05-21 DIAGNOSIS — C9000 Multiple myeloma not having achieved remission: Secondary | ICD-10-CM

## 2016-05-21 DIAGNOSIS — C9001 Multiple myeloma in remission: Secondary | ICD-10-CM

## 2016-05-21 DIAGNOSIS — Z5112 Encounter for antineoplastic immunotherapy: Secondary | ICD-10-CM | POA: Diagnosis not present

## 2016-05-21 LAB — CBC WITH DIFFERENTIAL (CANCER CENTER ONLY)
BASO#: 0 10*3/uL (ref 0.0–0.2)
BASO%: 0.7 % (ref 0.0–2.0)
EOS%: 1.1 % (ref 0.0–7.0)
Eosinophils Absolute: 0 10*3/uL (ref 0.0–0.5)
HCT: 34.3 % — ABNORMAL LOW (ref 38.7–49.9)
HGB: 12.5 g/dL — ABNORMAL LOW (ref 13.0–17.1)
LYMPH#: 0.7 10*3/uL — ABNORMAL LOW (ref 0.9–3.3)
LYMPH%: 24.7 % (ref 14.0–48.0)
MCH: 35.9 pg — ABNORMAL HIGH (ref 28.0–33.4)
MCHC: 36.4 g/dL — ABNORMAL HIGH (ref 32.0–35.9)
MCV: 99 fL — ABNORMAL HIGH (ref 82–98)
MONO#: 0.3 10*3/uL (ref 0.1–0.9)
MONO%: 12.2 % (ref 0.0–13.0)
NEUT#: 1.7 10*3/uL (ref 1.5–6.5)
NEUT%: 61.3 % (ref 40.0–80.0)
Platelets: 142 10*3/uL — ABNORMAL LOW (ref 145–400)
RBC: 3.48 10*6/uL — ABNORMAL LOW (ref 4.20–5.70)
RDW: 13 % (ref 11.1–15.7)
WBC: 2.7 10*3/uL — ABNORMAL LOW (ref 4.0–10.0)

## 2016-05-21 LAB — CMP (CANCER CENTER ONLY)
ALT(SGPT): 52 U/L — ABNORMAL HIGH (ref 10–47)
AST: 50 U/L — ABNORMAL HIGH (ref 11–38)
Albumin: 3.9 g/dL (ref 3.3–5.5)
Alkaline Phosphatase: 57 U/L (ref 26–84)
BUN, Bld: 15 mg/dL (ref 7–22)
CO2: 25 mEq/L (ref 18–33)
Calcium: 9 mg/dL (ref 8.0–10.3)
Chloride: 107 mEq/L (ref 98–108)
Creat: 1.4 mg/dl — ABNORMAL HIGH (ref 0.6–1.2)
Glucose, Bld: 99 mg/dL (ref 73–118)
Potassium: 3.7 mEq/L (ref 3.3–4.7)
Sodium: 142 mEq/L (ref 128–145)
Total Bilirubin: 0.9 mg/dl (ref 0.20–1.60)
Total Protein: 6.4 g/dL (ref 6.4–8.1)

## 2016-05-21 MED ORDER — BORTEZOMIB CHEMO SQ INJECTION 3.5 MG (2.5MG/ML)
1.3000 mg/m2 | Freq: Once | INTRAMUSCULAR | Status: AC
  Administered 2016-05-21: 2.75 mg via SUBCUTANEOUS
  Filled 2016-05-21: qty 2.75

## 2016-05-22 LAB — IGG, IGA, IGM
IgA, Qn, Serum: 78 mg/dL — ABNORMAL LOW (ref 90–386)
IgG, Qn, Serum: 827 mg/dL (ref 700–1600)
IgM, Qn, Serum: 15 mg/dL — ABNORMAL LOW (ref 20–172)

## 2016-05-24 LAB — KAPPA/LAMBDA LIGHT CHAINS
Ig Kappa Free Light Chain: 13.3 mg/L (ref 3.3–19.4)
Ig Lambda Free Light Chain: 12.3 mg/L (ref 5.7–26.3)
Kappa/Lambda FluidC Ratio: 1.08 (ref 0.26–1.65)

## 2016-05-25 LAB — PROTEIN ELECTROPHORESIS, SERUM, WITH REFLEX
A/G Ratio: 1.6 (ref 0.7–1.7)
Albumin: 3.9 g/dL (ref 2.9–4.4)
Alpha 1: 0.2 g/dL (ref 0.0–0.4)
Alpha 2: 0.6 g/dL (ref 0.4–1.0)
Beta: 0.9 g/dL (ref 0.7–1.3)
Gamma Globulin: 0.8 g/dL (ref 0.4–1.8)
Globulin, Total: 2.5 g/dL (ref 2.2–3.9)
Total Protein: 6.4 g/dL (ref 6.0–8.5)

## 2016-05-26 ENCOUNTER — Telehealth: Payer: Self-pay | Admitting: *Deleted

## 2016-05-26 NOTE — Telephone Encounter (Addendum)
Patient aware of results  ----- Message from [name], MD sent at 05/26/2016  7:01 AM EST ----- Call -  NO obvious myeloma in the blood!!!  Great job!!  Have a nice Valentine's Day with your wife!!  pete

## 2016-05-31 ENCOUNTER — Ambulatory Visit (HOSPITAL_BASED_OUTPATIENT_CLINIC_OR_DEPARTMENT_OTHER): Payer: BLUE CROSS/BLUE SHIELD | Admitting: Hematology & Oncology

## 2016-05-31 ENCOUNTER — Other Ambulatory Visit (HOSPITAL_BASED_OUTPATIENT_CLINIC_OR_DEPARTMENT_OTHER): Payer: BLUE CROSS/BLUE SHIELD

## 2016-05-31 ENCOUNTER — Ambulatory Visit (HOSPITAL_BASED_OUTPATIENT_CLINIC_OR_DEPARTMENT_OTHER): Payer: BLUE CROSS/BLUE SHIELD

## 2016-05-31 VITALS — BP 136/84 | HR 67 | Temp 98.1°F | Wt 220.1 lb

## 2016-05-31 DIAGNOSIS — C9001 Multiple myeloma in remission: Secondary | ICD-10-CM

## 2016-05-31 DIAGNOSIS — C9000 Multiple myeloma not having achieved remission: Secondary | ICD-10-CM

## 2016-05-31 DIAGNOSIS — Z5112 Encounter for antineoplastic immunotherapy: Secondary | ICD-10-CM

## 2016-05-31 LAB — CMP (CANCER CENTER ONLY)
ALT(SGPT): 63 U/L — ABNORMAL HIGH (ref 10–47)
AST: 38 U/L (ref 11–38)
Albumin: 3.8 g/dL (ref 3.3–5.5)
Alkaline Phosphatase: 66 U/L (ref 26–84)
BUN, Bld: 12 mg/dL (ref 7–22)
CO2: 24 mEq/L (ref 18–33)
Calcium: 8.2 mg/dL (ref 8.0–10.3)
Chloride: 105 mEq/L (ref 98–108)
Creat: 1.4 mg/dl — ABNORMAL HIGH (ref 0.6–1.2)
Glucose, Bld: 113 mg/dL (ref 73–118)
Potassium: 3.6 mEq/L (ref 3.3–4.7)
Sodium: 141 mEq/L (ref 128–145)
Total Bilirubin: 0.7 mg/dl (ref 0.20–1.60)
Total Protein: 6.6 g/dL (ref 6.4–8.1)

## 2016-05-31 LAB — CBC WITH DIFFERENTIAL (CANCER CENTER ONLY)
BASO#: 0 10*3/uL (ref 0.0–0.2)
BASO%: 0.3 % (ref 0.0–2.0)
EOS%: 3.4 % (ref 0.0–7.0)
Eosinophils Absolute: 0.1 10*3/uL (ref 0.0–0.5)
HCT: 36.3 % — ABNORMAL LOW (ref 38.7–49.9)
HGB: 13.1 g/dL (ref 13.0–17.1)
LYMPH#: 0.5 10*3/uL — ABNORMAL LOW (ref 0.9–3.3)
LYMPH%: 16 % (ref 14.0–48.0)
MCH: 35.9 pg — ABNORMAL HIGH (ref 28.0–33.4)
MCHC: 36.1 g/dL — ABNORMAL HIGH (ref 32.0–35.9)
MCV: 100 fL — ABNORMAL HIGH (ref 82–98)
MONO#: 0.4 10*3/uL (ref 0.1–0.9)
MONO%: 13.5 % — ABNORMAL HIGH (ref 0.0–13.0)
NEUT#: 2.2 10*3/uL (ref 1.5–6.5)
NEUT%: 66.8 % (ref 40.0–80.0)
Platelets: 123 10*3/uL — ABNORMAL LOW (ref 145–400)
RBC: 3.65 10*6/uL — ABNORMAL LOW (ref 4.20–5.70)
RDW: 12.6 % (ref 11.1–15.7)
WBC: 3.3 10*3/uL — ABNORMAL LOW (ref 4.0–10.0)

## 2016-05-31 MED ORDER — BORTEZOMIB CHEMO SQ INJECTION 3.5 MG (2.5MG/ML)
1.3000 mg/m2 | Freq: Once | INTRAMUSCULAR | Status: AC
  Administered 2016-05-31: 2.75 mg via SUBCUTANEOUS
  Filled 2016-05-31: qty 2.75

## 2016-05-31 NOTE — Progress Notes (Signed)
Hematology and Oncology Follow Up Visit  Tyler Phillips:[id number] 1965/05/17 51 y.o. 05/31/2016   Principle Diagnosis:   IgG Kappa myeloma  Current Therapy:  Status post autologous stem cell transplant on 05/22/2015   S/p Cycle #6 of RVD  Velcade q 2wk dosing/Revlimid 10mg  po q day (21/7) -      Interim History:  [name] is back for follow-up. He is doing fantastic. He has been quite busy. He is doing well at work. He is still trying to "ease" back into the work situation.  His wife comes in with him today. There apparently is a various serious situation at home. This involves whether children. This is clearly going to put [name] under a lot of stress. We talked about this for about 40 minutes. I know this is a very "tricky" situation. There really is no single way to try to handle this. I'm sure that the use of counseling will help.  His myeloma is doing fantastic. His last myeloma studies do not show any monoclonal spike in his blood.  I think that if he continues to show no evidence of myeloma and his serum, then we might go to move his Velcade to every 3 week dosing. One other possibility might be to switch him over to [facility] which is oral.  He and 8 friends went to [place] recently for a ski trip. Had a great time. I'm glad he was able to go try to help relieve some stress.  He is still working out. He showed me of any of him lifting weights. This was very impressive.  He has not had any fever. He's had no rashes. He has had no cough or shortness of breath.  His last IgG level back in early February was 827 milligrams per deciliter. His Kappa Light chain was 1.3 mg/dL  Overall, his performance status is ECOG 0.  Medications:  Current Outpatient Prescriptions:  .  valACYclovir (VALTREX) 500 MG tablet, Take 500 mg by mouth 2 (two) times daily., Disp: , Rfl:  .  aspirin 325 MG tablet, Take 325 mg by mouth daily., Disp: , Rfl:  .  Calcium-Vitamins C & D (CALCIUM/C/D)  500-10-250 MG-MG-UNIT CHEW, Chew by mouth., Disp: , Rfl:  .  cetirizine (ZYRTEC) 10 MG tablet, Take 10 mg by mouth daily., Disp: , Rfl:  .  chlorhexidine (PERIDEX) 0.12 % solution, Use as directed 15 mLs in the mouth or throat 2 (two) times daily. , Disp: , Rfl: 0 .  Cholecalciferol (VITAMIN D3) 1000 units CAPS, Take by mouth., Disp: , Rfl:  .  clindamycin (CLEOCIN) 300 MG capsule, Take by mouth., Disp: , Rfl:  .  hydrOXYzine (ATARAX/VISTARIL) 25 MG tablet, Take 1 tablet (25 mg total) by mouth every 6 (six) hours as needed for itching., Disp: 30 tablet, Rfl: 0 .  lenalidomide (REVLIMID) 10 MG capsule, Take 1 capsule daily for 21 days on and 7 days off. [id number], Disp: 21 capsule, Rfl: 0 .  LORazepam (ATIVAN) 1 MG tablet, Take 1 mg by mouth every 4 (four) hours as needed., Disp: , Rfl:  .  oxyCODONE (OXY IR/ROXICODONE) 5 MG immediate release tablet, Take 5 mg by mouth every 4 (four) hours as needed., Disp: , Rfl:  .  penicillin v potassium (VEETID) 500 MG tablet, TAKE ONE TABLET (500 MG TOTAL) BY MOUTH EVERY 6 HOURS FOR 7 DAYS., Disp: , Rfl: 0 .  prochlorperazine (COMPAZINE) 10 MG tablet, Take 10 mg by mouth every 6 (six) hours as  needed., Disp: , Rfl:   Allergies: No Known Allergies  Past Medical History, Surgical history, Social history, and Family History were reviewed and updated.  Review of Systems: As above  Physical Exam:  weight is 220 lb 1 oz (99.8 kg). His oral temperature is 98.1 F (36.7 C). His blood pressure is 136/84 and his pulse is 67.   Wt Readings from Last 3 Encounters:  05/31/16 220 lb 1 oz (99.8 kg)  04/20/16 222 lb 4 oz (100.8 kg)  04/05/16 221 lb 12.8 oz (100.6 kg)     Well-developed and well-nourished white male in no obvious distress. He has alopecia. Head and neck exam shows no ocular or oral lesions. There is some exposed mandibular bone in the left inner mandibular table. There are no palpable cervical or supraclavicular lymph nodes. Lungs are clear.  Cardiac exam regular rate and rhythm with no murmurs, rubs or bruits. Abdomen is soft. He has good bowel sounds. There is no fluid wave. There is no palpable hepatomegaly. His spleen tip is not palpable. Back exam shows no tenderness over the spine, ribs or hips. Extremities shows no clubbing, cyanosis or edema. He has good range of motion of his joints. He has good strength in his extremities. Has good pulses in his distal extremities.Marland Kitchen He may have some trace edema in his lower legs. Skin exam shows no rashes, ecchymoses or petechia.  Lab Results  Component Value Date   WBC 3.3 (L) 05/31/2016   HGB 13.1 05/31/2016   HCT 36.3 (L) 05/31/2016   MCV 100 (H) 05/31/2016   PLT 123 (L) 05/31/2016     Chemistry      Component Value Date/Time   NA 141 05/31/2016 1152   NA 138 09/03/2015 1042   K 3.6 05/31/2016 1152   K 4.3 09/03/2015 1042   CL 105 05/31/2016 1152   CO2 24 05/31/2016 1152   CO2 25 09/03/2015 1042   BUN 12 05/31/2016 1152   BUN 21.7 09/03/2015 1042   CREATININE 1.4 (H) 05/31/2016 1152   CREATININE 1.2 09/03/2015 1042      Component Value Date/Time   CALCIUM 8.2 05/31/2016 1152   CALCIUM 9.1 09/03/2015 1042   ALKPHOS 66 05/31/2016 1152   ALKPHOS 42 09/03/2015 1042   AST 38 05/31/2016 1152   AST 27 09/03/2015 1042   ALT 63 (H) 05/31/2016 1152   ALT 41 09/03/2015 1042   BILITOT 0.70 05/31/2016 1152   BILITOT 0.54 09/03/2015 1042         Impression and Plan: [name] is 51 year old gentleman with IgG Kappa myeloma. We finally got him to transplant. This was truly a process with him. It took almost a year to get him to transplant.  Of note, his pre-transplant bone marrow was done at [facility]. It showed 10% plasma cells.  He had flow cytometry which showed an atypical monoclonal cell population. His cytogenetics did not show any obvious chromosomal abnormalities. His FISH showed a t(14:16), +4, and +17p  We will go ahead and get him back on to the Revlimid. I don't think  there is a problem with this.  We will continue him on the Velcade every 2 weeks.Again, if his M spike continues to be negative, we might want to consider every 3 week Velcade or possibly switch him over to [facility] with this being oral.  I will plan to see him back in another month.  I have brain hard for he and his wife in dealing with this family  matter. I know this is a very tenuous subject. However, I noted that he and his wife will be able to get through it.   [name], MD 2/12/201812:49 PM

## 2016-06-01 ENCOUNTER — Other Ambulatory Visit: Payer: Self-pay | Admitting: *Deleted

## 2016-06-01 DIAGNOSIS — C9002 Multiple myeloma in relapse: Secondary | ICD-10-CM

## 2016-06-01 MED ORDER — LENALIDOMIDE 10 MG PO CAPS: ORAL_CAPSULE | ORAL | 0 refills | Status: DC

## 2016-06-15 DIAGNOSIS — H903 Sensorineural hearing loss, bilateral: Secondary | ICD-10-CM | POA: Diagnosis not present

## 2016-06-15 DIAGNOSIS — H9313 Tinnitus, bilateral: Secondary | ICD-10-CM | POA: Diagnosis not present

## 2016-06-18 ENCOUNTER — Ambulatory Visit (HOSPITAL_BASED_OUTPATIENT_CLINIC_OR_DEPARTMENT_OTHER): Payer: BLUE CROSS/BLUE SHIELD

## 2016-06-18 ENCOUNTER — Other Ambulatory Visit (HOSPITAL_BASED_OUTPATIENT_CLINIC_OR_DEPARTMENT_OTHER): Payer: BLUE CROSS/BLUE SHIELD

## 2016-06-18 ENCOUNTER — Ambulatory Visit (HOSPITAL_BASED_OUTPATIENT_CLINIC_OR_DEPARTMENT_OTHER): Payer: BLUE CROSS/BLUE SHIELD | Admitting: Hematology & Oncology

## 2016-06-18 VITALS — BP 133/80 | HR 61 | Resp 16 | Wt 219.0 lb

## 2016-06-18 DIAGNOSIS — Z5112 Encounter for antineoplastic immunotherapy: Secondary | ICD-10-CM | POA: Diagnosis not present

## 2016-06-18 DIAGNOSIS — C9000 Multiple myeloma not having achieved remission: Secondary | ICD-10-CM

## 2016-06-18 DIAGNOSIS — C9001 Multiple myeloma in remission: Secondary | ICD-10-CM | POA: Diagnosis not present

## 2016-06-18 DIAGNOSIS — C9002 Multiple myeloma in relapse: Secondary | ICD-10-CM | POA: Diagnosis not present

## 2016-06-18 LAB — CBC WITH DIFFERENTIAL (CANCER CENTER ONLY)
BASO#: 0 10*3/uL (ref 0.0–0.2)
BASO%: 0.6 % (ref 0.0–2.0)
EOS%: 1.6 % (ref 0.0–7.0)
Eosinophils Absolute: 0.1 10*3/uL (ref 0.0–0.5)
HCT: 36.9 % — ABNORMAL LOW (ref 38.7–49.9)
HGB: 13.4 g/dL (ref 13.0–17.1)
LYMPH#: 1.1 10*3/uL (ref 0.9–3.3)
LYMPH%: 34.9 % (ref 14.0–48.0)
MCH: 35.4 pg — ABNORMAL HIGH (ref 28.0–33.4)
MCHC: 36.3 g/dL — ABNORMAL HIGH (ref 32.0–35.9)
MCV: 98 fL (ref 82–98)
MONO#: 0.3 10*3/uL (ref 0.1–0.9)
MONO%: 9.5 % (ref 0.0–13.0)
NEUT#: 1.7 10*3/uL (ref 1.5–6.5)
NEUT%: 53.4 % (ref 40.0–80.0)
Platelets: 182 10*3/uL (ref 145–400)
RBC: 3.78 10*6/uL — ABNORMAL LOW (ref 4.20–5.70)
RDW: 12.7 % (ref 11.1–15.7)
WBC: 3.2 10*3/uL — ABNORMAL LOW (ref 4.0–10.0)

## 2016-06-18 LAB — CMP (CANCER CENTER ONLY)
ALT(SGPT): 50 U/L — ABNORMAL HIGH (ref 10–47)
AST: 36 U/L (ref 11–38)
Albumin: 3.9 g/dL (ref 3.3–5.5)
Alkaline Phosphatase: 70 U/L (ref 26–84)
BUN, Bld: 14 mg/dL (ref 7–22)
CO2: 23 mEq/L (ref 18–33)
Calcium: 8.9 mg/dL (ref 8.0–10.3)
Chloride: 106 mEq/L (ref 98–108)
Creat: 1.2 mg/dl (ref 0.6–1.2)
Glucose, Bld: 110 mg/dL (ref 73–118)
Potassium: 3.4 mEq/L (ref 3.3–4.7)
Sodium: 140 mEq/L (ref 128–145)
Total Bilirubin: 0.6 mg/dl (ref 0.20–1.60)
Total Protein: 6.9 g/dL (ref 6.4–8.1)

## 2016-06-18 MED ORDER — BORTEZOMIB CHEMO SQ INJECTION 3.5 MG (2.5MG/ML)
1.3000 mg/m2 | Freq: Once | INTRAMUSCULAR | Status: AC
  Administered 2016-06-18: 2.75 mg via SUBCUTANEOUS
  Filled 2016-06-18: qty 2.75

## 2016-06-18 NOTE — Progress Notes (Signed)
Hematology and Oncology Follow Up Visit  Swayam Corporon ZS:[id number] 1966-03-29 51 y.o. 06/18/2016   Principle Diagnosis:   IgG Kappa myeloma  Current Therapy:  Status post autologous stem cell transplant on 05/22/2015   S/p Cycle #6 of RVD  Velcade q 3wk dosing/Revlimid 10mg  po q day (21/7) -      Interim History:  [name] is back for follow-up. He is doing fantastic. He has been quite busy. He is doing well at work.   There dealing with the family situation very nicely. I know this is quite complicated. However, they truly are doing their best to make life easier for their family.  His last myeloma studies do not show any obvious monoclonal spike. Because of this, I think that we can probably switch him to every 3 week dosing.   He has had no problems with nausea or vomiting. He's had no issues with bowels or bladder. He's had no rashes.  He still takes the aspirin daily.  He is still in the process of getting his vaccinations.  He was recent seen at [facility]. He does not have to go back for another year.  Of note, his last IgG level was 8 27 mg/dL. His Kappa light chain was 1.3 mg/dL.  Overall, his performance status is ECOG 0.  Medications:  Current Outpatient Prescriptions:  .  aspirin 325 MG tablet, Take 325 mg by mouth daily., Disp: , Rfl:  .  Calcium-Vitamins C & D (CALCIUM/C/D) 500-10-250 MG-MG-UNIT CHEW, Chew by mouth., Disp: , Rfl:  .  cetirizine (ZYRTEC) 10 MG tablet, Take 10 mg by mouth daily., Disp: , Rfl:  .  chlorhexidine (PERIDEX) 0.12 % solution, Use as directed 15 mLs in the mouth or throat 2 (two) times daily. , Disp: , Rfl: 0 .  Cholecalciferol (VITAMIN D3) 1000 units CAPS, Take by mouth., Disp: , Rfl:  .  clindamycin (CLEOCIN) 300 MG capsule, Take by mouth., Disp: , Rfl:  .  hydrOXYzine (ATARAX/VISTARIL) 25 MG tablet, Take 1 tablet (25 mg total) by mouth every 6 (six) hours as needed for itching., Disp: 30 tablet, Rfl: 0 .  lenalidomide (REVLIMID) 10 MG  capsule, Take 1 capsule daily for 21 days on and 7 days off. [id number], Disp: 21 capsule, Rfl: 0 .  LORazepam (ATIVAN) 1 MG tablet, Take 1 mg by mouth every 4 (four) hours as needed., Disp: , Rfl:  .  oxyCODONE (OXY IR/ROXICODONE) 5 MG immediate release tablet, Take 5 mg by mouth every 4 (four) hours as needed., Disp: , Rfl:  .  penicillin v potassium (VEETID) 500 MG tablet, TAKE ONE TABLET (500 MG TOTAL) BY MOUTH EVERY 6 HOURS FOR 7 DAYS., Disp: , Rfl: 0 .  prochlorperazine (COMPAZINE) 10 MG tablet, Take 10 mg by mouth every 6 (six) hours as needed., Disp: , Rfl:  .  valACYclovir (VALTREX) 500 MG tablet, Take 500 mg by mouth 2 (two) times daily., Disp: , Rfl:   Allergies: No Known Allergies  Past Medical History, Surgical history, Social history, and Family History were reviewed and updated.  Review of Systems: As above  Physical Exam:  weight is 219 lb (99.3 kg). His blood pressure is 133/80 and his pulse is 61. His respiration is 16 and oxygen saturation is 98%.   Wt Readings from Last 3 Encounters:  06/18/16 219 lb (99.3 kg)  05/31/16 220 lb 1 oz (99.8 kg)  04/20/16 222 lb 4 oz (100.8 kg)     Well-developed and well-nourished white male  in no obvious distress. He has alopecia. Head and neck exam shows no ocular or oral lesions. There is some exposed mandibular bone in the left inner mandibular table. There are no palpable cervical or supraclavicular lymph nodes. Lungs are clear. Cardiac exam regular rate and rhythm with no murmurs, rubs or bruits. Abdomen is soft. He has good bowel sounds. There is no fluid wave. There is no palpable hepatomegaly. His spleen tip is not palpable. Back exam shows no tenderness over the spine, ribs or hips. Extremities shows no clubbing, cyanosis or edema. He has good range of motion of his joints. He has good strength in his extremities. Has good pulses in his distal extremities.Marland Kitchen He may have some trace edema in his lower legs. Skin exam shows no rashes,  ecchymoses or petechia.  Lab Results  Component Value Date   WBC 3.2 (L) 06/18/2016   HGB 13.4 06/18/2016   HCT 36.9 (L) 06/18/2016   MCV 98 06/18/2016   PLT 182 06/18/2016     Chemistry      Component Value Date/Time   NA 140 06/18/2016 1147   NA 138 09/03/2015 1042   K 3.4 06/18/2016 1147   K 4.3 09/03/2015 1042   CL 106 06/18/2016 1147   CO2 23 06/18/2016 1147   CO2 25 09/03/2015 1042   BUN 14 06/18/2016 1147   BUN 21.7 09/03/2015 1042   CREATININE 1.2 06/18/2016 1147   CREATININE 1.2 09/03/2015 1042      Component Value Date/Time   CALCIUM 8.9 06/18/2016 1147   CALCIUM 9.1 09/03/2015 1042   ALKPHOS 70 06/18/2016 1147   ALKPHOS 42 09/03/2015 1042   AST 36 06/18/2016 1147   AST 27 09/03/2015 1042   ALT 50 (H) 06/18/2016 1147   ALT 41 09/03/2015 1042   BILITOT 0.60 06/18/2016 1147   BILITOT 0.54 09/03/2015 1042         Impression and Plan: [name] is 51 year old gentleman with IgG Kappa myeloma. We finally got him to transplant. This was truly a process with him. It took almost a year to get him to transplant.  Of note, his pre-transplant bone marrow was done at [facility]. It showed 10% plasma cells.  He had flow cytometry which showed an atypical monoclonal cell population. His cytogenetics did not show any obvious chromosomal abnormalities. His FISH showed a t(14:16), +4, and +17p  Again, we will switch his Velcade every 3 weeks. I think this would be reasonable and still quite effective.   I will plan to see him back in 3 weeks. Hopefully, they will go to [place] for Easter to visit their son and daughter-in-law. I know they will be going up to [place] in April to finalize a Arboriculturist.    [name], MD 3/2/201812:43 PM

## 2016-06-18 NOTE — Patient Instructions (Signed)

## 2016-06-21 LAB — KAPPA/LAMBDA LIGHT CHAINS
Ig Kappa Free Light Chain: 19.6 mg/L — ABNORMAL HIGH (ref 3.3–19.4)
Ig Lambda Free Light Chain: 17.6 mg/L (ref 5.7–26.3)
Kappa/Lambda FluidC Ratio: 1.11 (ref 0.26–1.65)

## 2016-06-23 ENCOUNTER — Encounter: Payer: Self-pay | Admitting: *Deleted

## 2016-06-23 LAB — MULTIPLE MYELOMA PANEL, SERUM
Albumin SerPl Elph-Mcnc: 3.9 g/dL (ref 2.9–4.4)
Albumin/Glob SerPl: 1.4 (ref 0.7–1.7)
Alpha 1: 0.2 g/dL (ref 0.0–0.4)
Alpha2 Glob SerPl Elph-Mcnc: 0.7 g/dL (ref 0.4–1.0)
B-Globulin SerPl Elph-Mcnc: 0.8 g/dL (ref 0.7–1.3)
Gamma Glob SerPl Elph-Mcnc: 1 g/dL (ref 0.4–1.8)
Globulin, Total: 2.8 g/dL (ref 2.2–3.9)
IgA, Qn, Serum: 91 mg/dL (ref 90–386)
IgG, Qn, Serum: 930 mg/dL (ref 700–1600)
IgM, Qn, Serum: 49 mg/dL (ref 20–172)
Total Protein: 6.7 g/dL (ref 6.0–8.5)

## 2016-06-25 ENCOUNTER — Other Ambulatory Visit: Payer: Self-pay | Admitting: *Deleted

## 2016-06-25 DIAGNOSIS — C9002 Multiple myeloma in relapse: Secondary | ICD-10-CM

## 2016-06-25 MED ORDER — LENALIDOMIDE 10 MG PO CAPS: ORAL_CAPSULE | ORAL | 0 refills | Status: DC

## 2016-07-02 ENCOUNTER — Other Ambulatory Visit: Payer: BLUE CROSS/BLUE SHIELD

## 2016-07-02 ENCOUNTER — Ambulatory Visit: Payer: BLUE CROSS/BLUE SHIELD

## 2016-07-02 IMAGING — DX DG BONE SURVEY MET
8 of 10 series · 8 of 10 positions shown · non-contrast
Comparison: None.

CLINICAL DATA: Acute leukemia, staging of myeloma.  No complaints

EXAM:
METASTATIC BONE SURVEY

[chest pa]
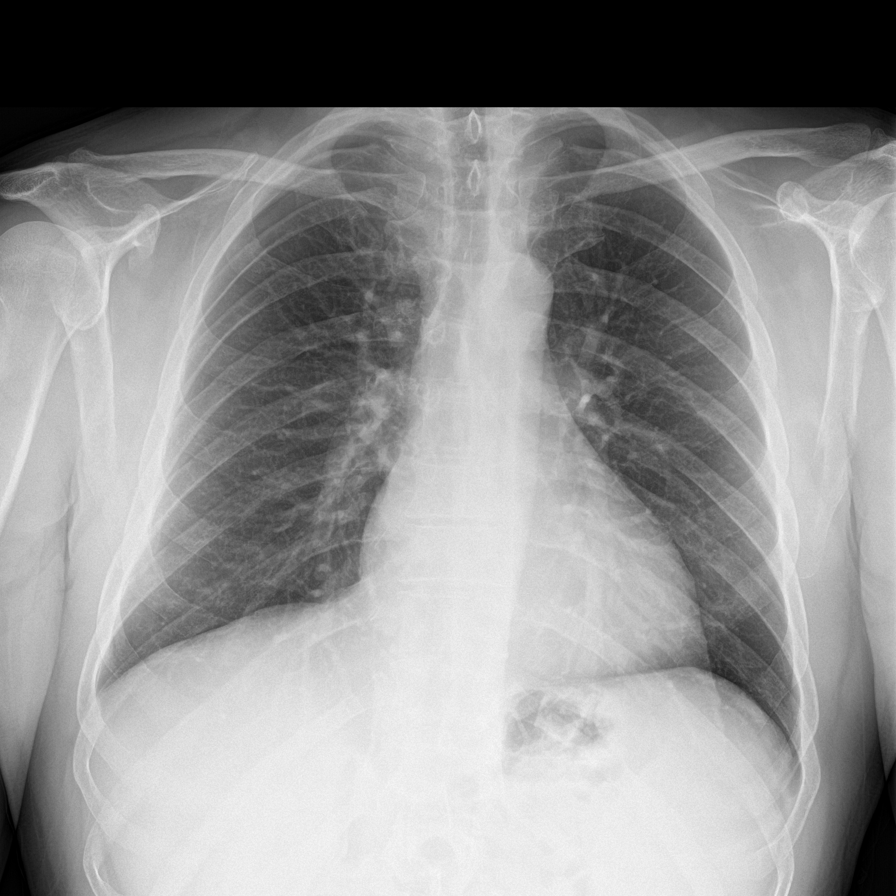

[c-spine lat]
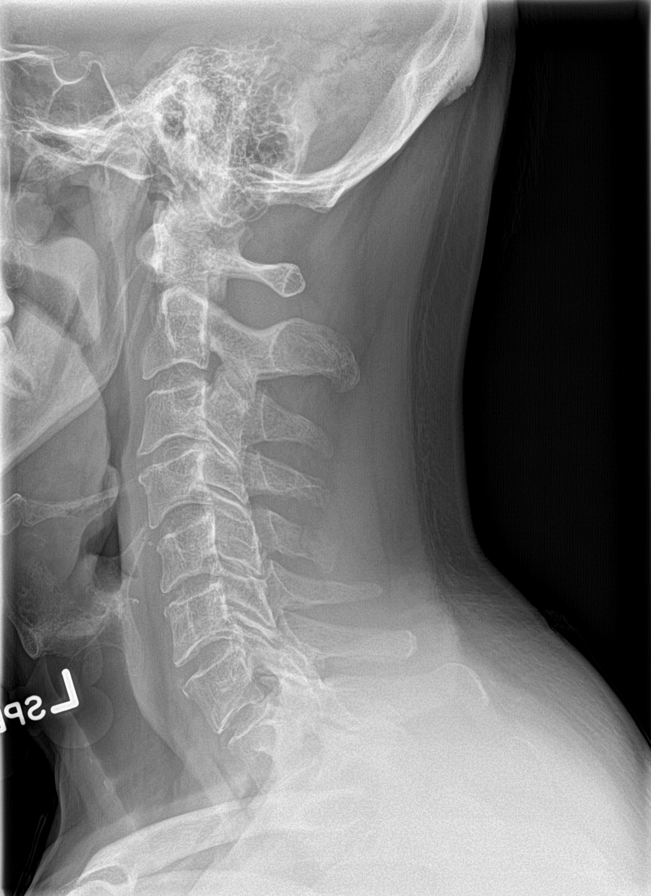

[skull lat]
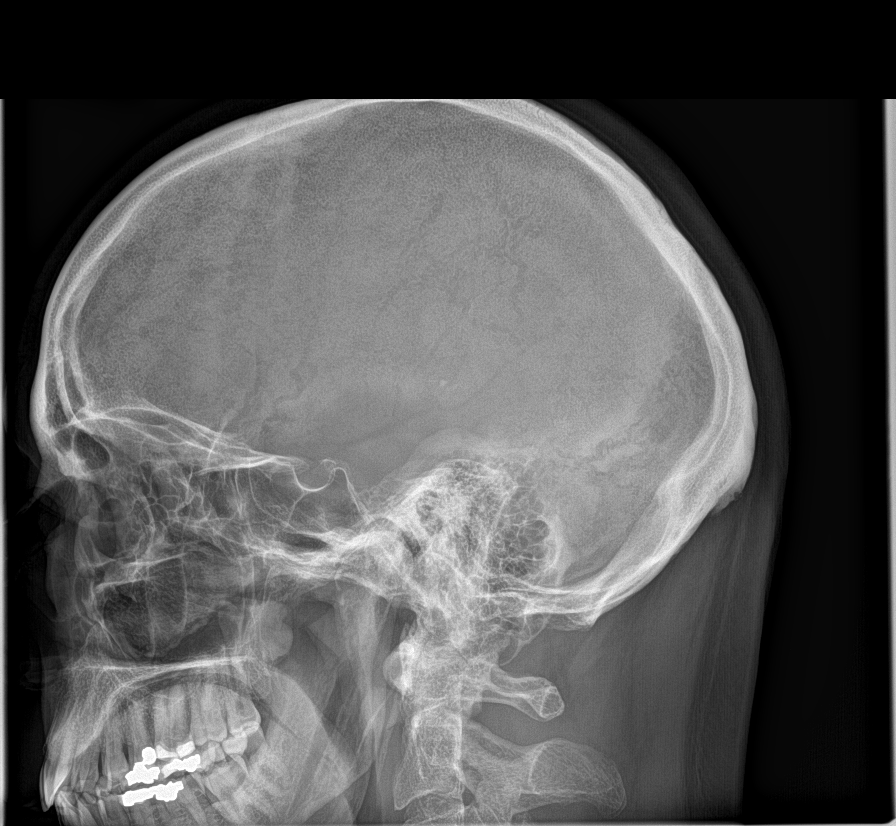

[shoulder ap (1 of 2)]
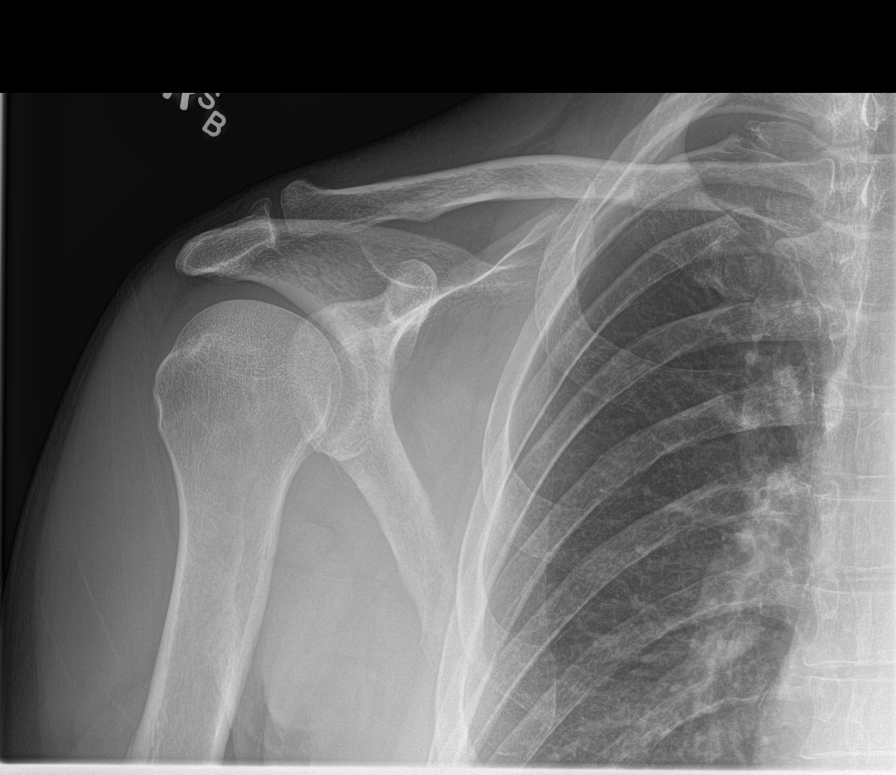

[shoulder ap (2 of 2)]
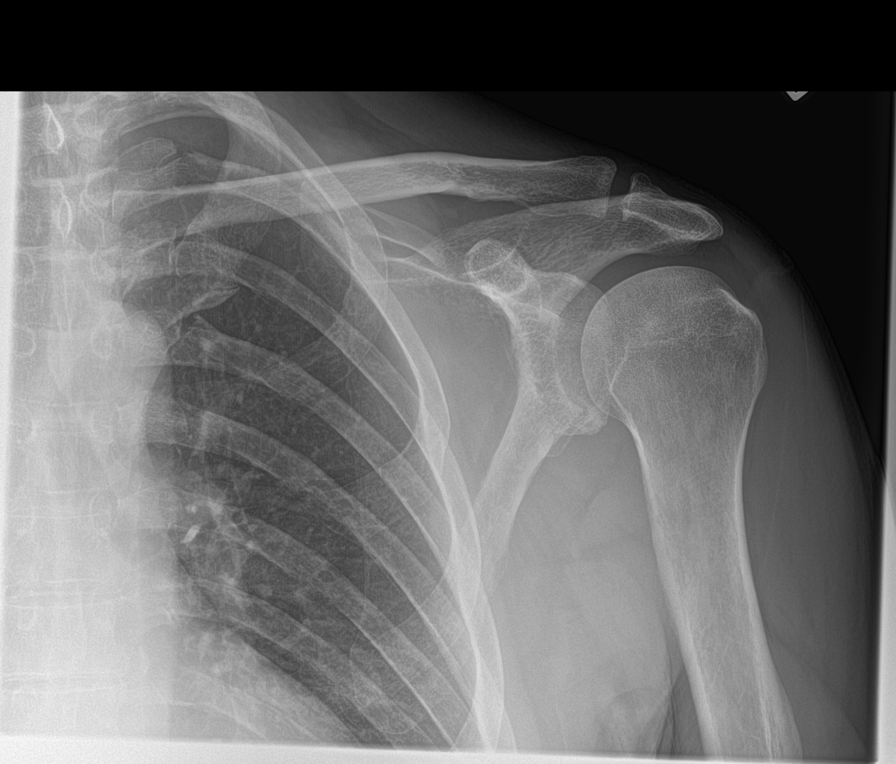

[humerus ap (1 of 2)]
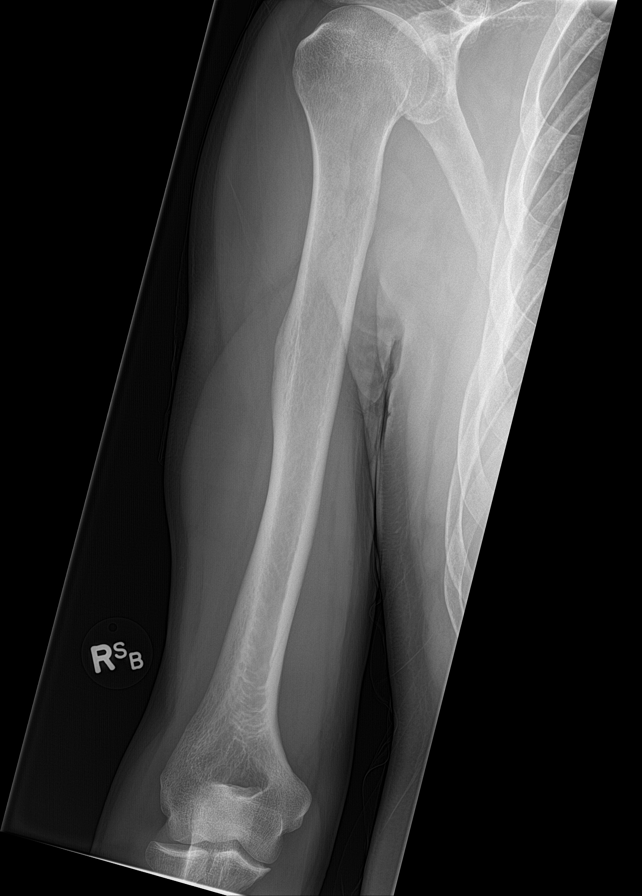

[humerus ap (2 of 2)]
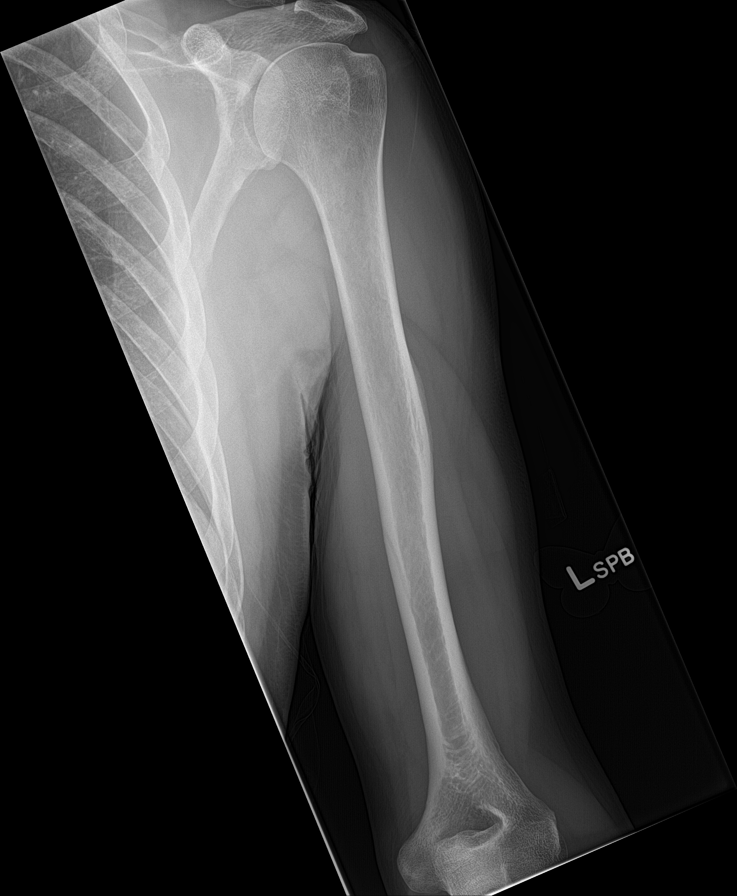

[c-spine ap]
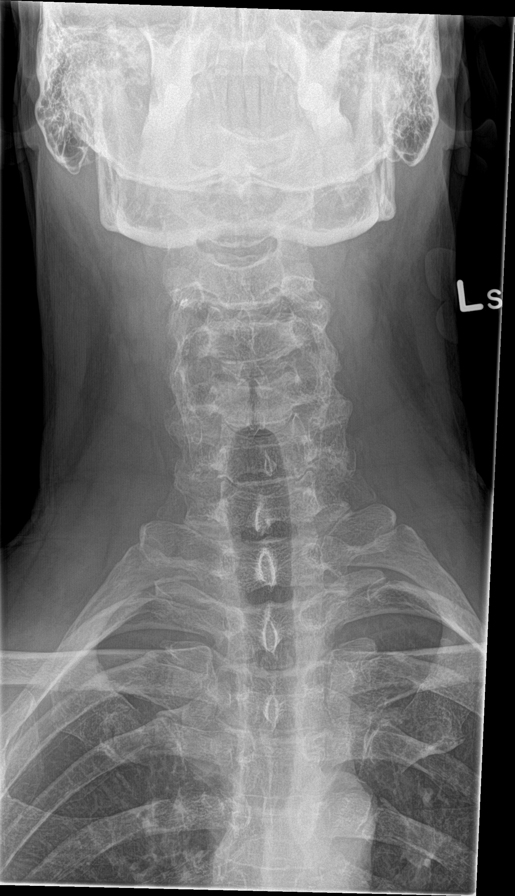

[8 of 10 positions shown; findings below may reference images not displayed]

FINDINGS: The chest film reveals the lungs to be adequately inflated and
clear. The heart and pulmonary vascularity are normal. There is no
pleural effusion. The ribs exhibit no lytic or blastic lesions.

The skull and spine exhibit no lytic or blastic lesions. There is
mild degenerative change of the lower lumbar facet joints.

The pectoral girdle exhibits no lytic or blastic lesion nor other
acute bony abnormality.

The bony pelvis is adequately mineralized with no lytic or blastic
lesion. The hip joints are unremarkable. There is a well corticated
lucency in the left femoral neck. The shafts of the femurs and
tibias and fibulas are unremarkable.
IMPRESSION: There are no findings correction there are no lytic or blastic bony
lesions. No findings are demonstrated to suggest metastatic disease.
A sclerotic marginated lucency in the left femoral neck is most
compatible with a cyst.

## 2016-07-09 ENCOUNTER — Other Ambulatory Visit (HOSPITAL_BASED_OUTPATIENT_CLINIC_OR_DEPARTMENT_OTHER): Payer: BLUE CROSS/BLUE SHIELD

## 2016-07-09 ENCOUNTER — Ambulatory Visit (HOSPITAL_BASED_OUTPATIENT_CLINIC_OR_DEPARTMENT_OTHER): Payer: BLUE CROSS/BLUE SHIELD

## 2016-07-09 VITALS — BP 125/74 | HR 57 | Temp 97.9°F | Resp 17

## 2016-07-09 DIAGNOSIS — C9001 Multiple myeloma in remission: Secondary | ICD-10-CM

## 2016-07-09 DIAGNOSIS — Z5112 Encounter for antineoplastic immunotherapy: Secondary | ICD-10-CM

## 2016-07-09 DIAGNOSIS — C9000 Multiple myeloma not having achieved remission: Secondary | ICD-10-CM

## 2016-07-09 LAB — CMP (CANCER CENTER ONLY)
ALT(SGPT): 57 U/L — ABNORMAL HIGH (ref 10–47)
AST: 34 U/L (ref 11–38)
Albumin: 3.7 g/dL (ref 3.3–5.5)
Alkaline Phosphatase: 59 U/L (ref 26–84)
BUN, Bld: 13 mg/dL (ref 7–22)
CO2: 25 mEq/L (ref 18–33)
Calcium: 9 mg/dL (ref 8.0–10.3)
Chloride: 106 mEq/L (ref 98–108)
Creat: 1.4 mg/dl — ABNORMAL HIGH (ref 0.6–1.2)
Glucose, Bld: 110 mg/dL (ref 73–118)
Potassium: 4 mEq/L (ref 3.3–4.7)
Sodium: 139 mEq/L (ref 128–145)
Total Bilirubin: 0.8 mg/dl (ref 0.20–1.60)
Total Protein: 6.6 g/dL (ref 6.4–8.1)

## 2016-07-09 LAB — CBC WITH DIFFERENTIAL (CANCER CENTER ONLY)
BASO#: 0 10*3/uL (ref 0.0–0.2)
BASO%: 0.3 % (ref 0.0–2.0)
EOS%: 6.2 % (ref 0.0–7.0)
Eosinophils Absolute: 0.2 10*3/uL (ref 0.0–0.5)
HCT: 38.5 % — ABNORMAL LOW (ref 38.7–49.9)
HGB: 14 g/dL (ref 13.0–17.1)
LYMPH#: 0.9 10*3/uL (ref 0.9–3.3)
LYMPH%: 27.6 % (ref 14.0–48.0)
MCH: 35.4 pg — ABNORMAL HIGH (ref 28.0–33.4)
MCHC: 36.4 g/dL — ABNORMAL HIGH (ref 32.0–35.9)
MCV: 98 fL (ref 82–98)
MONO#: 0.4 10*3/uL (ref 0.1–0.9)
MONO%: 11.8 % (ref 0.0–13.0)
NEUT#: 1.7 10*3/uL (ref 1.5–6.5)
NEUT%: 54.1 % (ref 40.0–80.0)
Platelets: 127 10*3/uL — ABNORMAL LOW (ref 145–400)
RBC: 3.95 10*6/uL — ABNORMAL LOW (ref 4.20–5.70)
RDW: 13.2 % (ref 11.1–15.7)
WBC: 3.2 10*3/uL — ABNORMAL LOW (ref 4.0–10.0)

## 2016-07-09 MED ORDER — BORTEZOMIB CHEMO SQ INJECTION 3.5 MG (2.5MG/ML)
1.3000 mg/m2 | Freq: Once | INTRAMUSCULAR | Status: AC
  Administered 2016-07-09: 2.75 mg via SUBCUTANEOUS
  Filled 2016-07-09: qty 1.1

## 2016-07-09 NOTE — Patient Instructions (Signed)
[facility] Discharge Instructions for Patients Receiving Chemotherapy  Today you received the following chemotherapy agents Velcade. To help prevent nausea and vomiting after your treatment, we encourage you to take your nausea medication as directed.  If you develop nausea and vomiting that is not controlled by your nausea medication, call the clinic.   BELOW ARE SYMPTOMS THAT SHOULD BE REPORTED IMMEDIATELY:  *FEVER GREATER THAN 100.5 F  *CHILLS WITH OR WITHOUT FEVER  NAUSEA AND VOMITING THAT IS NOT CONTROLLED WITH YOUR NAUSEA MEDICATION  *UNUSUAL SHORTNESS OF BREATH  *UNUSUAL BRUISING OR BLEEDING  TENDERNESS IN MOUTH AND THROAT WITH OR WITHOUT PRESENCE OF ULCERS  *URINARY PROBLEMS  *BOWEL PROBLEMS  UNUSUAL RASH Items with * indicate a potential emergency and should be followed up as soon as possible.  Feel free to call the clinic you have any questions or concerns. The clinic phone number is [phone].  Please show the CHEMO ALERT CARD at check-in to the Emergency Department and triage nurse.    

## 2016-07-15 ENCOUNTER — Other Ambulatory Visit: Payer: Self-pay | Admitting: *Deleted

## 2016-07-15 DIAGNOSIS — Z23 Encounter for immunization: Secondary | ICD-10-CM | POA: Diagnosis not present

## 2016-07-15 DIAGNOSIS — C9002 Multiple myeloma in relapse: Secondary | ICD-10-CM

## 2016-07-15 MED ORDER — LENALIDOMIDE 10 MG PO CAPS: ORAL_CAPSULE | ORAL | 0 refills | Status: DC

## 2016-07-16 ENCOUNTER — Other Ambulatory Visit: Payer: BLUE CROSS/BLUE SHIELD

## 2016-07-16 ENCOUNTER — Ambulatory Visit: Payer: BLUE CROSS/BLUE SHIELD

## 2016-07-16 ENCOUNTER — Ambulatory Visit: Payer: BLUE CROSS/BLUE SHIELD | Admitting: Hematology & Oncology

## 2016-07-22 ENCOUNTER — Telehealth: Payer: Self-pay | Admitting: *Deleted

## 2016-07-22 ENCOUNTER — Encounter: Payer: Self-pay | Admitting: *Deleted

## 2016-07-22 NOTE — Telephone Encounter (Signed)
Patient faxed copy of all vaccines post stem cell transplant, entered into chart

## 2016-07-30 ENCOUNTER — Other Ambulatory Visit: Payer: BLUE CROSS/BLUE SHIELD

## 2016-07-30 ENCOUNTER — Ambulatory Visit: Payer: BLUE CROSS/BLUE SHIELD | Admitting: Hematology & Oncology

## 2016-07-30 ENCOUNTER — Ambulatory Visit: Payer: BLUE CROSS/BLUE SHIELD

## 2016-08-02 ENCOUNTER — Ambulatory Visit (HOSPITAL_BASED_OUTPATIENT_CLINIC_OR_DEPARTMENT_OTHER): Payer: BLUE CROSS/BLUE SHIELD | Admitting: Hematology & Oncology

## 2016-08-02 ENCOUNTER — Other Ambulatory Visit (HOSPITAL_BASED_OUTPATIENT_CLINIC_OR_DEPARTMENT_OTHER): Payer: BLUE CROSS/BLUE SHIELD

## 2016-08-02 ENCOUNTER — Ambulatory Visit (HOSPITAL_BASED_OUTPATIENT_CLINIC_OR_DEPARTMENT_OTHER): Payer: BLUE CROSS/BLUE SHIELD

## 2016-08-02 VITALS — BP 126/76 | HR 60 | Temp 97.8°F | Resp 18 | Wt 220.4 lb

## 2016-08-02 DIAGNOSIS — C9001 Multiple myeloma in remission: Secondary | ICD-10-CM

## 2016-08-02 DIAGNOSIS — Z9481 Bone marrow transplant status: Secondary | ICD-10-CM

## 2016-08-02 DIAGNOSIS — C9000 Multiple myeloma not having achieved remission: Secondary | ICD-10-CM

## 2016-08-02 DIAGNOSIS — Z5112 Encounter for antineoplastic immunotherapy: Secondary | ICD-10-CM | POA: Diagnosis not present

## 2016-08-02 LAB — CMP (CANCER CENTER ONLY)
ALT(SGPT): 62 U/L — ABNORMAL HIGH (ref 10–47)
AST: 32 U/L (ref 11–38)
Albumin: 3.8 g/dL (ref 3.3–5.5)
Alkaline Phosphatase: 57 U/L (ref 26–84)
BUN, Bld: 10 mg/dL (ref 7–22)
CO2: 26 mEq/L (ref 18–33)
Calcium: 8.6 mg/dL (ref 8.0–10.3)
Chloride: 104 mEq/L (ref 98–108)
Creat: 1.5 mg/dl — ABNORMAL HIGH (ref 0.6–1.2)
Glucose, Bld: 139 mg/dL — ABNORMAL HIGH (ref 73–118)
Potassium: 4.3 mEq/L (ref 3.3–4.7)
Sodium: 135 mEq/L (ref 128–145)
Total Bilirubin: 0.8 mg/dl (ref 0.20–1.60)
Total Protein: 6.7 g/dL (ref 6.4–8.1)

## 2016-08-02 LAB — CBC WITH DIFFERENTIAL (CANCER CENTER ONLY)
BASO#: 0 10*3/uL (ref 0.0–0.2)
BASO%: 0.6 % (ref 0.0–2.0)
EOS%: 7.4 % — ABNORMAL HIGH (ref 0.0–7.0)
Eosinophils Absolute: 0.3 10*3/uL (ref 0.0–0.5)
HCT: 37.5 % — ABNORMAL LOW (ref 38.7–49.9)
HGB: 13.6 g/dL (ref 13.0–17.1)
LYMPH#: 0.9 10*3/uL (ref 0.9–3.3)
LYMPH%: 25.9 % (ref 14.0–48.0)
MCH: 35.9 pg — ABNORMAL HIGH (ref 28.0–33.4)
MCHC: 36.3 g/dL — ABNORMAL HIGH (ref 32.0–35.9)
MCV: 99 fL — ABNORMAL HIGH (ref 82–98)
MONO#: 0.3 10*3/uL (ref 0.1–0.9)
MONO%: 8.5 % (ref 0.0–13.0)
NEUT#: 2 10*3/uL (ref 1.5–6.5)
NEUT%: 57.6 % (ref 40.0–80.0)
Platelets: 119 10*3/uL — ABNORMAL LOW (ref 145–400)
RBC: 3.79 10*6/uL — ABNORMAL LOW (ref 4.20–5.70)
RDW: 13.5 % (ref 11.1–15.7)
WBC: 3.4 10*3/uL — ABNORMAL LOW (ref 4.0–10.0)

## 2016-08-02 MED ORDER — BORTEZOMIB CHEMO SQ INJECTION 3.5 MG (2.5MG/ML)
1.3000 mg/m2 | Freq: Once | INTRAMUSCULAR | Status: AC
  Administered 2016-08-02: 2.75 mg via SUBCUTANEOUS
  Filled 2016-08-02: qty 2.75

## 2016-08-02 NOTE — Patient Instructions (Signed)
[facility] Discharge Instructions for Patients Receiving Chemotherapy  Today you received the following chemotherapy agents Velcade. To help prevent nausea and vomiting after your treatment, we encourage you to take your nausea medication as directed.  If you develop nausea and vomiting that is not controlled by your nausea medication, call the clinic.   BELOW ARE SYMPTOMS THAT SHOULD BE REPORTED IMMEDIATELY:  *FEVER GREATER THAN 100.5 F  *CHILLS WITH OR WITHOUT FEVER  NAUSEA AND VOMITING THAT IS NOT CONTROLLED WITH YOUR NAUSEA MEDICATION  *UNUSUAL SHORTNESS OF BREATH  *UNUSUAL BRUISING OR BLEEDING  TENDERNESS IN MOUTH AND THROAT WITH OR WITHOUT PRESENCE OF ULCERS  *URINARY PROBLEMS  *BOWEL PROBLEMS  UNUSUAL RASH Items with * indicate a potential emergency and should be followed up as soon as possible.  Feel free to call the clinic you have any questions or concerns. The clinic phone number is [phone].  Please show the CHEMO ALERT CARD at check-in to the Emergency Department and triage nurse.    

## 2016-08-02 NOTE — Progress Notes (Signed)
Hematology and Oncology Follow Up Visit  Tyler Phillips [id number] 10-01-1965 51 y.o. 08/02/2016   Principle Diagnosis:   IgG Kappa myeloma  Current Therapy:  Status post autologous stem cell transplant on 05/22/2015   S/p Cycle #6 of RVD  Velcade q 3wk dosing/Revlimid 10mg  po q day (21/7) -      Interim History:  [name] is back for follow-up. He is doing fantastic. He has been quite busy. He is doing well at work.   He and his wife are dealing with the family situation very nicely. I know this is quite complicated. However, they truly are doing their best to make life easier for their family. His seems like things are going better in this regard.  His last myeloma studies do not show any obvious monoclonal spike. His monoclonal spike was not detected. His IgG level was 930 mg/dL. His Kappa Lightchain was 2 mg/dL.  I've noticed that his renal function is creeping up slowly. I told him to take valacyclovir once a day. I told him to make sure that he drinks a lot of fluids. 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 I said to him that he has to stay well-hydrated.  He has had no problems with nausea or vomiting. He's had no issues with bowels or bladder. He's had no rashes.  He still takes the aspirin daily.  He is still in the process of getting his vaccinations.  He probably does not have to go back to [facility] for another 4 or 5 months..  Overall, his performance status is ECOG 0.  Medications:  Current Outpatient Prescriptions:  .  aspirin 325 MG tablet, Take 325 mg by mouth daily., Disp: , Rfl:  .  Calcium-Vitamins C & D (CALCIUM/C/D) 500-10-250 MG-MG-UNIT CHEW, Chew by mouth., Disp: , Rfl:  .  cetirizine (ZYRTEC) 10 MG tablet, Take 10 mg by mouth daily., Disp: , Rfl:  .  chlorhexidine (PERIDEX) 0.12 % solution, Use as directed 15 mLs in the mouth or throat 2 (two) times daily. , Disp: , Rfl: 0 .  Cholecalciferol (VITAMIN D3) 1000 units CAPS, Take by mouth., Disp: , Rfl:  .   clindamycin (CLEOCIN) 300 MG capsule, Take by mouth., Disp: , Rfl:  .  hydrOXYzine (ATARAX/VISTARIL) 25 MG tablet, Take 1 tablet (25 mg total) by mouth every 6 (six) hours as needed for itching., Disp: 30 tablet, Rfl: 0 .  lenalidomide (REVLIMID) 10 MG capsule, Take 1 capsule daily for 21 days on and 7 days off. [id number], Disp: 21 capsule, Rfl: 0 .  LORazepam (ATIVAN) 1 MG tablet, Take 1 mg by mouth every 4 (four) hours as needed., Disp: , Rfl:  .  oxyCODONE (OXY IR/ROXICODONE) 5 MG immediate release tablet, Take 5 mg by mouth every 4 (four) hours as needed., Disp: , Rfl:  .  penicillin v potassium (VEETID) 500 MG tablet, TAKE ONE TABLET (500 MG TOTAL) BY MOUTH EVERY 6 HOURS FOR 7 DAYS., Disp: , Rfl: 0 .  prochlorperazine (COMPAZINE) 10 MG tablet, Take 10 mg by mouth every 6 (six) hours as needed., Disp: , Rfl:  .  valACYclovir (VALTREX) 500 MG tablet, Take 500 mg by mouth 2 (two) times daily., Disp: , Rfl:   Allergies: No Known Allergies  Past Medical History, Surgical history, Social history, and Family History were reviewed and updated.  Review of Systems: As above  Physical Exam:  weight is 220 lb 6.4 oz (100 kg). His oral temperature is 97.8 F (36.6  C). His blood pressure is 126/76 and his pulse is 60. His respiration is 18 and oxygen saturation is 100%.   Wt Readings from Last 3 Encounters:  08/02/16 220 lb 6.4 oz (100 kg)  06/18/16 219 lb (99.3 kg)  05/31/16 220 lb 1 oz (99.8 kg)     Well-developed and well-nourished white male in no obvious distress. He has alopecia. Head and neck exam shows no ocular or oral lesions. There is some exposed mandibular bone in the left inner mandibular table. There are no palpable cervical or supraclavicular lymph nodes. Lungs are clear. Cardiac exam regular rate and rhythm with no murmurs, rubs or bruits. Abdomen is soft. He has good bowel sounds. There is no fluid wave. There is no palpable hepatomegaly. His spleen tip is not palpable. Back  exam shows no tenderness over the spine, ribs or hips. Extremities shows no clubbing, cyanosis or edema. He has good range of motion of his joints. He has good strength in his extremities. Has good pulses in his distal extremities.Marland Kitchen He may have some trace edema in his lower legs. Skin exam shows no rashes, ecchymoses or petechia.  Lab Results  Component Value Date   WBC 3.4 (L) 08/02/2016   HGB 13.6 08/02/2016   HCT 37.5 (L) 08/02/2016   MCV 99 (H) 08/02/2016   PLT 119 (L) 08/02/2016     Chemistry      Component Value Date/Time   NA 135 08/02/2016 1018   NA 138 09/03/2015 1042   K 4.3 08/02/2016 1018   K 4.3 09/03/2015 1042   CL 104 08/02/2016 1018   CO2 26 08/02/2016 1018   CO2 25 09/03/2015 1042   BUN 10 08/02/2016 1018   BUN 21.7 09/03/2015 1042   CREATININE 1.5 (H) 08/02/2016 1018   CREATININE 1.2 09/03/2015 1042      Component Value Date/Time   CALCIUM 8.6 08/02/2016 1018   CALCIUM 9.1 09/03/2015 1042   ALKPHOS 57 08/02/2016 1018   ALKPHOS 42 09/03/2015 1042   AST 32 08/02/2016 1018   AST 27 09/03/2015 1042   ALT 62 (H) 08/02/2016 1018   ALT 41 09/03/2015 1042   BILITOT 0.80 08/02/2016 1018   BILITOT 0.54 09/03/2015 1042         Impression and Plan: [name] is 51 year old gentleman with IgG Kappa myeloma. We finally got him to transplant. This was truly a process with him. It took almost a year to get him to transplant.  Of note, his pre-transplant bone marrow was done at [facility]. It showed 10% plasma cells.  He had flow cytometry which showed an atypical monoclonal cell population. His cytogenetics did not show any obvious chromosomal abnormalities. His FISH showed a t(14:16), +4, and +17p  Again, we will switch his Velcade every 3 weeks. I think this would be reasonable and still quite effective.   I will plan to see him back in 3 weeks. I will be watching his creatinine closely.   [name], MD 4/16/20181:01 PM

## 2016-08-13 ENCOUNTER — Other Ambulatory Visit: Payer: BLUE CROSS/BLUE SHIELD

## 2016-08-13 ENCOUNTER — Ambulatory Visit: Payer: BLUE CROSS/BLUE SHIELD

## 2016-08-13 ENCOUNTER — Ambulatory Visit: Payer: BLUE CROSS/BLUE SHIELD | Admitting: Hematology & Oncology

## 2016-08-20 ENCOUNTER — Other Ambulatory Visit (HOSPITAL_BASED_OUTPATIENT_CLINIC_OR_DEPARTMENT_OTHER): Payer: BLUE CROSS/BLUE SHIELD

## 2016-08-20 ENCOUNTER — Ambulatory Visit (HOSPITAL_BASED_OUTPATIENT_CLINIC_OR_DEPARTMENT_OTHER): Payer: BLUE CROSS/BLUE SHIELD

## 2016-08-20 ENCOUNTER — Other Ambulatory Visit: Payer: Self-pay | Admitting: *Deleted

## 2016-08-20 VITALS — BP 117/76 | HR 56 | Temp 97.6°F | Resp 18

## 2016-08-20 DIAGNOSIS — Z5112 Encounter for antineoplastic immunotherapy: Secondary | ICD-10-CM

## 2016-08-20 DIAGNOSIS — C9001 Multiple myeloma in remission: Secondary | ICD-10-CM

## 2016-08-20 DIAGNOSIS — C9000 Multiple myeloma not having achieved remission: Secondary | ICD-10-CM

## 2016-08-20 LAB — CMP (CANCER CENTER ONLY)
ALT(SGPT): 73 U/L — ABNORMAL HIGH (ref 10–47)
AST: 40 U/L — ABNORMAL HIGH (ref 11–38)
Albumin: 3.8 g/dL (ref 3.3–5.5)
Alkaline Phosphatase: 65 U/L (ref 26–84)
BUN, Bld: 12 mg/dL (ref 7–22)
CO2: 24 mEq/L (ref 18–33)
Calcium: 8.5 mg/dL (ref 8.0–10.3)
Chloride: 104 mEq/L (ref 98–108)
Creat: 1.4 mg/dl — ABNORMAL HIGH (ref 0.6–1.2)
Glucose, Bld: 104 mg/dL (ref 73–118)
Potassium: 4.1 mEq/L (ref 3.3–4.7)
Sodium: 138 mEq/L (ref 128–145)
Total Bilirubin: 0.7 mg/dl (ref 0.20–1.60)
Total Protein: 6.6 g/dL (ref 6.4–8.1)

## 2016-08-20 LAB — CBC WITH DIFFERENTIAL (CANCER CENTER ONLY)
BASO#: 0 10*3/uL (ref 0.0–0.2)
BASO%: 0.8 % (ref 0.0–2.0)
EOS%: 6.7 % (ref 0.0–7.0)
Eosinophils Absolute: 0.3 10*3/uL (ref 0.0–0.5)
HCT: 37.4 % — ABNORMAL LOW (ref 38.7–49.9)
HGB: 13.4 g/dL (ref 13.0–17.1)
LYMPH#: 0.8 10*3/uL — ABNORMAL LOW (ref 0.9–3.3)
LYMPH%: 22.5 % (ref 14.0–48.0)
MCH: 35.3 pg — ABNORMAL HIGH (ref 28.0–33.4)
MCHC: 35.8 g/dL (ref 32.0–35.9)
MCV: 98 fL (ref 82–98)
MONO#: 0.3 10*3/uL (ref 0.1–0.9)
MONO%: 7 % (ref 0.0–13.0)
NEUT#: 2.4 10*3/uL (ref 1.5–6.5)
NEUT%: 63 % (ref 40.0–80.0)
Platelets: 130 10*3/uL — ABNORMAL LOW (ref 145–400)
RBC: 3.8 10*6/uL — ABNORMAL LOW (ref 4.20–5.70)
RDW: 13.4 % (ref 11.1–15.7)
WBC: 3.7 10*3/uL — ABNORMAL LOW (ref 4.0–10.0)

## 2016-08-20 MED ORDER — BORTEZOMIB CHEMO SQ INJECTION 3.5 MG (2.5MG/ML)
1.3000 mg/m2 | Freq: Once | INTRAMUSCULAR | Status: AC
  Administered 2016-08-20: 2.75 mg via SUBCUTANEOUS
  Filled 2016-08-20: qty 2.75

## 2016-08-20 NOTE — Patient Instructions (Addendum)
[facility] Discharge Instructions for Patients Receiving Chemotherapy  Today you received the following chemotherapy agents: Velcade   To help prevent nausea and vomiting after your treatment, we encourage you to take your nausea medication as directed   If you develop nausea and vomiting that is not controlled by your nausea medication, call the clinic.   BELOW ARE SYMPTOMS THAT SHOULD BE REPORTED IMMEDIATELY:  *FEVER GREATER THAN 100.5 F  *CHILLS WITH OR WITHOUT FEVER  NAUSEA AND VOMITING THAT IS NOT CONTROLLED WITH YOUR NAUSEA MEDICATION  *UNUSUAL SHORTNESS OF BREATH  *UNUSUAL BRUISING OR BLEEDING  TENDERNESS IN MOUTH AND THROAT WITH OR WITHOUT PRESENCE OF ULCERS  *URINARY PROBLEMS  *BOWEL PROBLEMS  UNUSUAL RASH Items with * indicate a potential emergency and should be followed up as soon as possible.  Feel free to call the clinic you have any questions or concerns. The clinic phone number is [phone].  Please show the CHEMO ALERT CARD at check-in to the Emergency Department and triage nurse.   Bortezomib injection What is this medicine? BORTEZOMIB (bor TEZ oh mib) is a medicine that targets proteins in cancer cells and stops the cancer cells from growing. It is used to treat multiple myeloma and mantle-cell lymphoma. This medicine may be used for other purposes; ask your health care provider or pharmacist if you have questions. COMMON BRAND NAME(S): Velcade What should I tell my health care provider before I take this medicine? They need to know if you have any of these conditions: -diabetes -heart disease -irregular heartbeat -liver disease -on hemodialysis -low blood counts, like low white blood cells, platelets, or hemoglobin -peripheral neuropathy -taking medicine for blood pressure -an unusual or allergic reaction to bortezomib, mannitol, boron, other medicines, foods, dyes, or preservatives -pregnant or trying to get  pregnant -breast-feeding How should I use this medicine? This medicine is for injection into a vein or for injection under the skin. It is given by a health care professional in a hospital or clinic setting. Talk to your pediatrician regarding the use of this medicine in children. Special care may be needed. Overdosage: If you think you have taken too much of this medicine contact a poison control center or emergency room at once. NOTE: This medicine is only for you. Do not share this medicine with others. What if I miss a dose? It is important not to miss your dose. Call your doctor or health care professional if you are unable to keep an appointment. What may interact with this medicine? This medicine may interact with the following medications: -ketoconazole -rifampin -ritonavir -St. John's Wort This list may not describe all possible interactions. Give your health care provider a list of all the medicines, herbs, non-prescription drugs, or dietary supplements you use. Also tell them if you smoke, drink alcohol, or use illegal drugs. Some items may interact with your medicine. What should I watch for while using this medicine? You may get drowsy or dizzy. Do not drive, use machinery, or do anything that needs mental alertness until you know how this medicine affects you. Do not stand or sit up quickly, especially if you are an older patient. This reduces the risk of dizzy or fainting spells. In some cases, you may be given additional medicines to help with side effects. Follow all directions for their use. Call your doctor or health care professional for advice if you get a fever, chills or sore throat, or other symptoms of a cold or flu. Do not   treat yourself. This drug decreases your body's ability to fight infections. Try to avoid being around people who are sick. This medicine may increase your risk to bruise or bleed. Call your doctor or health care professional if you notice any unusual  bleeding. You may need blood work done while you are taking this medicine. In some patients, this medicine may cause a serious brain infection that may cause death. If you have any problems seeing, thinking, speaking, walking, or standing, tell your doctor right away. If you cannot reach your doctor, urgently seek other source of medical care. Check with your doctor or health care professional if you get an attack of severe diarrhea, nausea and vomiting, or if you sweat a lot. The loss of too much body fluid can make it dangerous for you to take this medicine. Do not become pregnant while taking this medicine or for at least 2 months after stopping it. Women should inform their doctor if they wish to become pregnant or think they might be pregnant. Men should not father a child while taking this medicine and for at least 2 months after stopping it. There is a potential for serious side effects to an unborn child. Talk to your health care professional or pharmacist for more information. Do not breast-feed an infant while taking this medicine or for 2 months after stopping it. This medicine may interfere with the ability to have a child. You should talk with your doctor or health care professional if you are concerned about your fertility. What side effects may I notice from receiving this medicine? Side effects that you should report to your doctor or health care professional as soon as possible: -allergic reactions like skin rash, itching or hives, swelling of the face, lips, or tongue -breathing problems -changes in hearing -changes in vision -fast, irregular heartbeat -feeling faint or lightheaded, falls -pain, tingling, numbness in the hands or feet -right upper belly pain -seizures -swelling of the ankles, feet, hands -unusual bleeding or bruising -unusually weak or tired -vomiting -yellowing of the eyes or skin Side effects that usually do not require medical attention (report to your  doctor or health care professional if they continue or are bothersome): -changes in emotions or moods -constipation -diarrhea -loss of appetite -headache -irritation at site where injected -nausea This list may not describe all possible side effects. Call your doctor for medical advice about side effects. You may report side effects to FDA at 1-800-FDA-1088. Where should I keep my medicine? This drug is given in a hospital or clinic and will not be stored at home. NOTE: This sheet is a summary. It may not cover all possible information. If you have questions about this medicine, talk to your doctor, pharmacist, or health care provider.  2018 Elsevier/Gold Standard (2016-03-04 15:53:51)   

## 2016-08-30 ENCOUNTER — Other Ambulatory Visit: Payer: Self-pay | Admitting: *Deleted

## 2016-08-30 DIAGNOSIS — C9002 Multiple myeloma in relapse: Secondary | ICD-10-CM

## 2016-08-30 DIAGNOSIS — W57XXXA Bitten or stung by nonvenomous insect and other nonvenomous arthropods, initial encounter: Secondary | ICD-10-CM | POA: Diagnosis not present

## 2016-08-30 DIAGNOSIS — S20469A Insect bite (nonvenomous) of unspecified back wall of thorax, initial encounter: Secondary | ICD-10-CM | POA: Diagnosis not present

## 2016-08-30 MED ORDER — LENALIDOMIDE 10 MG PO CAPS: ORAL_CAPSULE | ORAL | 0 refills | Status: DC

## 2016-09-09 ENCOUNTER — Other Ambulatory Visit: Payer: Self-pay | Admitting: *Deleted

## 2016-09-09 DIAGNOSIS — C9001 Multiple myeloma in remission: Secondary | ICD-10-CM

## 2016-09-10 ENCOUNTER — Other Ambulatory Visit (HOSPITAL_BASED_OUTPATIENT_CLINIC_OR_DEPARTMENT_OTHER): Payer: BLUE CROSS/BLUE SHIELD

## 2016-09-10 ENCOUNTER — Ambulatory Visit (HOSPITAL_BASED_OUTPATIENT_CLINIC_OR_DEPARTMENT_OTHER): Payer: BLUE CROSS/BLUE SHIELD

## 2016-09-10 ENCOUNTER — Ambulatory Visit (HOSPITAL_BASED_OUTPATIENT_CLINIC_OR_DEPARTMENT_OTHER): Payer: BLUE CROSS/BLUE SHIELD | Admitting: Hematology & Oncology

## 2016-09-10 VITALS — BP 127/77 | HR 50 | Temp 98.2°F | Resp 16 | Wt 221.4 lb

## 2016-09-10 DIAGNOSIS — C9000 Multiple myeloma not having achieved remission: Secondary | ICD-10-CM

## 2016-09-10 DIAGNOSIS — C9001 Multiple myeloma in remission: Secondary | ICD-10-CM

## 2016-09-10 DIAGNOSIS — Z5112 Encounter for antineoplastic immunotherapy: Secondary | ICD-10-CM

## 2016-09-10 LAB — CBC WITH DIFFERENTIAL (CANCER CENTER ONLY)
BASO#: 0 10*3/uL (ref 0.0–0.2)
BASO%: 1.3 % (ref 0.0–2.0)
EOS%: 5.4 % (ref 0.0–7.0)
Eosinophils Absolute: 0.2 10*3/uL (ref 0.0–0.5)
HCT: 37.1 % — ABNORMAL LOW (ref 38.7–49.9)
HGB: 13.5 g/dL (ref 13.0–17.1)
LYMPH#: 0.8 10*3/uL — ABNORMAL LOW (ref 0.9–3.3)
LYMPH%: 28.1 % (ref 14.0–48.0)
MCH: 35.7 pg — ABNORMAL HIGH (ref 28.0–33.4)
MCHC: 36.4 g/dL — ABNORMAL HIGH (ref 32.0–35.9)
MCV: 98 fL (ref 82–98)
MONO#: 0.4 10*3/uL (ref 0.1–0.9)
MONO%: 13 % (ref 0.0–13.0)
NEUT#: 1.6 10*3/uL (ref 1.5–6.5)
NEUT%: 52.2 % (ref 40.0–80.0)
Platelets: 144 10*3/uL — ABNORMAL LOW (ref 145–400)
RBC: 3.78 10*6/uL — ABNORMAL LOW (ref 4.20–5.70)
RDW: 13.5 % (ref 11.1–15.7)
WBC: 3 10*3/uL — ABNORMAL LOW (ref 4.0–10.0)

## 2016-09-10 LAB — CMP (CANCER CENTER ONLY)
ALT(SGPT): 54 U/L — ABNORMAL HIGH (ref 10–47)
AST: 30 U/L (ref 11–38)
Albumin: 3.6 g/dL (ref 3.3–5.5)
Alkaline Phosphatase: 52 U/L (ref 26–84)
BUN, Bld: 16 mg/dL (ref 7–22)
CO2: 26 mEq/L (ref 18–33)
Calcium: 8.9 mg/dL (ref 8.0–10.3)
Chloride: 107 mEq/L (ref 98–108)
Creat: 1.1 mg/dl (ref 0.6–1.2)
Glucose, Bld: 90 mg/dL (ref 73–118)
Potassium: 4.3 mEq/L (ref 3.3–4.7)
Sodium: 137 mEq/L (ref 128–145)
Total Bilirubin: 0.8 mg/dl (ref 0.20–1.60)
Total Protein: 6.6 g/dL (ref 6.4–8.1)

## 2016-09-10 MED ORDER — BORTEZOMIB CHEMO SQ INJECTION 3.5 MG (2.5MG/ML)
1.3000 mg/m2 | Freq: Once | INTRAMUSCULAR | Status: AC
  Administered 2016-09-10: 2.75 mg via SUBCUTANEOUS
  Filled 2016-09-10: qty 2.75

## 2016-09-10 NOTE — Progress Notes (Signed)
Hematology and Oncology Follow Up Visit  Ketih Phillips [id number] [birth year] 51 y.o. 09/10/2016   Principle Diagnosis:   IgG Kappa myeloma  Current Therapy:  Status post autologous stem cell transplant on 05/22/2015   S/p Cycle #6 of RVD  Velcade q 3wk dosing/Revlimid 10mg  po q day (21/7) -      Interim History:  [name] is back for follow-up. He is doing fantastic. He has been quite busy. He is doing well at work. . They want to see her before she gets too sick to realize that her family is with her.  The other family situation for them is doing so much better now.  He is done well with the Velcade and Revlimid. There was no monoclonal spike in his blood with his last visit. His IgG level is not having 30 mg/dL. His Kappa Light chain is 2 mg/dL.  He's had no change in bowel or bladder habits. He's had no cough. He's had no fever. He's had no rashes. There's been no leg swelling.  He will see the transplant doctors at [facility] in June.  Overall, his performance status is ECOG 0.  Medications:  Current Outpatient Prescriptions:  .  aspirin 325 MG tablet, Take 325 mg by mouth daily., Disp: , Rfl:  .  Calcium-Vitamins C & D (CALCIUM/C/D) 500-10-250 MG-MG-UNIT CHEW, Chew by mouth., Disp: , Rfl:  .  cetirizine (ZYRTEC) 10 MG tablet, Take 10 mg by mouth daily., Disp: , Rfl:  .  chlorhexidine (PERIDEX) 0.12 % solution, Use as directed 15 mLs in the mouth or throat 2 (two) times daily. , Disp: , Rfl: 0 .  Cholecalciferol (VITAMIN D3) 1000 units CAPS, Take by mouth., Disp: , Rfl:  .  clindamycin (CLEOCIN) 300 MG capsule, Take by mouth., Disp: , Rfl:  .  hydrOXYzine (ATARAX/VISTARIL) 25 MG tablet, Take 1 tablet (25 mg total) by mouth every 6 (six) hours as needed for itching., Disp: 30 tablet, Rfl: 0 .  lenalidomide (REVLIMID) 10 MG capsule, Take 1 capsule daily for 21 days on and 7 days off. XNAT#[id number], Disp: 21 capsule, Rfl: 0 .  LORazepam (ATIVAN) 1 MG tablet, Take 1 mg by mouth every  4 (four) hours as needed., Disp: , Rfl:  .  oxyCODONE (OXY IR/ROXICODONE) 5 MG immediate release tablet, Take 5 mg by mouth every 4 (four) hours as needed., Disp: , Rfl:  .  penicillin v potassium (VEETID) 500 MG tablet, TAKE ONE TABLET (500 MG TOTAL) BY MOUTH EVERY 6 HOURS FOR 7 DAYS., Disp: , Rfl: 0 .  prochlorperazine (COMPAZINE) 10 MG tablet, Take 10 mg by mouth every 6 (six) hours as needed., Disp: , Rfl:  .  valACYclovir (VALTREX) 500 MG tablet, Take 500 mg by mouth 2 (two) times daily., Disp: , Rfl:   Allergies: No Known Allergies  Past Medical History, Surgical history, Social history, and Family History were reviewed and updated.  Review of Systems: As above  Physical Exam:  weight is 221 lb 6.4 oz (100.4 kg). His oral temperature is 98.2 F (36.8 C). His blood pressure is 127/77 and his pulse is 50 (abnormal). His respiration is 16 and oxygen saturation is 98%.   Wt Readings from Last 3 Encounters:  09/10/16 221 lb 6.4 oz (100.4 kg)  08/02/16 220 lb 6.4 oz (100 kg)  06/18/16 219 lb (99.3 kg)     Well-developed and well-nourished white male in no obvious distress. He has alopecia. Head and neck exam shows no ocular or oral  lesions. There is some exposed mandibular bone in the left inner mandibular table. There are no palpable cervical or supraclavicular lymph nodes. Lungs are clear. Cardiac exam regular rate and rhythm with no murmurs, rubs or bruits. Abdomen is soft. He has good bowel sounds. There is no fluid wave. There is no palpable hepatomegaly. His spleen tip is not palpable. Back exam shows no tenderness over the spine, ribs or hips. Extremities shows no clubbing, cyanosis or edema. He has good range of motion of his joints. He has good strength in his extremities. Has good pulses in his distal extremities.Marland Kitchen He may have some trace edema in his lower legs. Skin exam shows no rashes, ecchymoses or petechia.  Lab Results  Component Value Date   WBC 3.0 (L) 09/10/2016   HGB  13.5 09/10/2016   HCT 37.1 (L) 09/10/2016   MCV 98 09/10/2016   PLT 144 (L) 09/10/2016     Chemistry      Component Value Date/Time   NA 138 08/20/2016 0846   NA 138 09/03/2015 1042   K 4.1 08/20/2016 0846   K 4.3 09/03/2015 1042   CL 104 08/20/2016 0846   CO2 24 08/20/2016 0846   CO2 25 09/03/2015 1042   BUN 12 08/20/2016 0846   BUN 21.7 09/03/2015 1042   CREATININE 1.4 (H) 08/20/2016 0846   CREATININE 1.2 09/03/2015 1042      Component Value Date/Time   CALCIUM 8.5 08/20/2016 0846   CALCIUM 9.1 09/03/2015 1042   ALKPHOS 65 08/20/2016 0846   ALKPHOS 42 09/03/2015 1042   AST 40 (H) 08/20/2016 0846   AST 27 09/03/2015 1042   ALT 73 (H) 08/20/2016 0846   ALT 41 09/03/2015 1042   BILITOT 0.70 08/20/2016 0846   BILITOT 0.54 09/03/2015 1042         Impression and Plan: [name] is 51 year old gentleman with IgG Kappa myeloma. We finally got him to transplant. This was truly a process with him. It took almost a year to get him to transplant.  Of note, his pre-transplant bone marrow was done at [facility] It showed 10% plasma cells.  He had flow cytometry which showed an atypical monoclonal cell population. His cytogenetics did not show any obvious chromosomal abnormalities. His FISH showed a t(14:16), +4, and +17p  We'll go ahead and give Velcade today. Hopefully, the every 3 week schedule will still work well for him.  I will plan to see him back in 6 weeks. He will come in just for Velcade and lab work in 3 weeks.  [name], MD 5/25/20189:40 AM

## 2016-09-10 NOTE — Addendum Note (Signed)
Addended by: Burney Gauze R on: 09/10/2016 10:09 AM   Modules accepted: Orders

## 2016-09-10 NOTE — Patient Instructions (Signed)
[facility] Discharge Instructions for Patients Receiving Chemotherapy  Today you received the following chemotherapy agents Velcade. To help prevent nausea and vomiting after your treatment, we encourage you to take your nausea medication as directed.  If you develop nausea and vomiting that is not controlled by your nausea medication, call the clinic.   BELOW ARE SYMPTOMS THAT SHOULD BE REPORTED IMMEDIATELY:  *FEVER GREATER THAN 100.5 F  *CHILLS WITH OR WITHOUT FEVER  NAUSEA AND VOMITING THAT IS NOT CONTROLLED WITH YOUR NAUSEA MEDICATION  *UNUSUAL SHORTNESS OF BREATH  *UNUSUAL BRUISING OR BLEEDING  TENDERNESS IN MOUTH AND THROAT WITH OR WITHOUT PRESENCE OF ULCERS  *URINARY PROBLEMS  *BOWEL PROBLEMS  UNUSUAL RASH Items with * indicate a potential emergency and should be followed up as soon as possible.  Feel free to call the clinic you have any questions or concerns. The clinic phone number is [phone].  Please show the CHEMO ALERT CARD at check-in to the Emergency Department and triage nurse.    

## 2016-09-15 ENCOUNTER — Encounter: Payer: Self-pay | Admitting: *Deleted

## 2016-09-15 LAB — MULTIPLE MYELOMA PANEL, SERUM
Albumin SerPl Elph-Mcnc: 4 g/dL (ref 2.9–4.4)
Albumin/Glob SerPl: 1.7 (ref 0.7–1.7)
Alpha 1: 0.2 g/dL (ref 0.0–0.4)
Alpha2 Glob SerPl Elph-Mcnc: 0.6 g/dL (ref 0.4–1.0)
B-Globulin SerPl Elph-Mcnc: 0.9 g/dL (ref 0.7–1.3)
Gamma Glob SerPl Elph-Mcnc: 0.8 g/dL (ref 0.4–1.8)
Globulin, Total: 2.5 g/dL (ref 2.2–3.9)
IgA, Qn, Serum: 94 mg/dL (ref 90–386)
IgG, Qn, Serum: 953 mg/dL (ref 700–1600)
IgM, Qn, Serum: 21 mg/dL (ref 20–172)
Total Protein: 6.5 g/dL (ref 6.0–8.5)

## 2016-09-16 ENCOUNTER — Encounter: Payer: Self-pay | Admitting: Hematology & Oncology

## 2016-09-21 ENCOUNTER — Other Ambulatory Visit: Payer: Self-pay | Admitting: *Deleted

## 2016-09-21 DIAGNOSIS — C9002 Multiple myeloma in relapse: Secondary | ICD-10-CM

## 2016-09-21 MED ORDER — LENALIDOMIDE 10 MG PO CAPS: ORAL_CAPSULE | ORAL | 0 refills | Status: DC

## 2016-10-01 ENCOUNTER — Ambulatory Visit: Payer: BLUE CROSS/BLUE SHIELD

## 2016-10-01 ENCOUNTER — Other Ambulatory Visit (HOSPITAL_BASED_OUTPATIENT_CLINIC_OR_DEPARTMENT_OTHER): Payer: BLUE CROSS/BLUE SHIELD

## 2016-10-01 ENCOUNTER — Ambulatory Visit: Payer: BLUE CROSS/BLUE SHIELD | Admitting: Hematology & Oncology

## 2016-10-01 ENCOUNTER — Ambulatory Visit (HOSPITAL_BASED_OUTPATIENT_CLINIC_OR_DEPARTMENT_OTHER): Payer: BLUE CROSS/BLUE SHIELD

## 2016-10-01 ENCOUNTER — Other Ambulatory Visit: Payer: BLUE CROSS/BLUE SHIELD

## 2016-10-01 ENCOUNTER — Ambulatory Visit (HOSPITAL_BASED_OUTPATIENT_CLINIC_OR_DEPARTMENT_OTHER): Payer: BLUE CROSS/BLUE SHIELD | Admitting: Hematology & Oncology

## 2016-10-01 VITALS — BP 126/87 | HR 53 | Temp 98.4°F | Resp 16 | Wt 221.0 lb

## 2016-10-01 DIAGNOSIS — C9001 Multiple myeloma in remission: Secondary | ICD-10-CM | POA: Diagnosis not present

## 2016-10-01 DIAGNOSIS — Z5112 Encounter for antineoplastic immunotherapy: Secondary | ICD-10-CM | POA: Diagnosis not present

## 2016-10-01 LAB — CMP (CANCER CENTER ONLY)
ALT(SGPT): 64 U/L — ABNORMAL HIGH (ref 10–47)
AST: 33 U/L (ref 11–38)
Albumin: 3.4 g/dL (ref 3.3–5.5)
Alkaline Phosphatase: 58 U/L (ref 26–84)
BUN, Bld: 14 mg/dL (ref 7–22)
CO2: 26 mEq/L (ref 18–33)
Calcium: 8.3 mg/dL (ref 8.0–10.3)
Chloride: 110 mEq/L — ABNORMAL HIGH (ref 98–108)
Creat: 1.3 mg/dl — ABNORMAL HIGH (ref 0.6–1.2)
Glucose, Bld: 112 mg/dL (ref 73–118)
Potassium: 4 mEq/L (ref 3.3–4.7)
Sodium: 139 mEq/L (ref 128–145)
Total Bilirubin: 0.9 mg/dl (ref 0.20–1.60)
Total Protein: 6.4 g/dL (ref 6.4–8.1)

## 2016-10-01 LAB — CBC WITH DIFFERENTIAL (CANCER CENTER ONLY)
BASO#: 0 10*3/uL (ref 0.0–0.2)
BASO%: 0.4 % (ref 0.0–2.0)
EOS%: 4.3 % (ref 0.0–7.0)
Eosinophils Absolute: 0.1 10*3/uL (ref 0.0–0.5)
HCT: 36.7 % — ABNORMAL LOW (ref 38.7–49.9)
HGB: 13.2 g/dL (ref 13.0–17.1)
LYMPH#: 0.9 10*3/uL (ref 0.9–3.3)
LYMPH%: 32.6 % (ref 14.0–48.0)
MCH: 35.6 pg — ABNORMAL HIGH (ref 28.0–33.4)
MCHC: 36 g/dL — ABNORMAL HIGH (ref 32.0–35.9)
MCV: 99 fL — ABNORMAL HIGH (ref 82–98)
MONO#: 0.3 10*3/uL (ref 0.1–0.9)
MONO%: 11.3 % (ref 0.0–13.0)
NEUT#: 1.5 10*3/uL (ref 1.5–6.5)
NEUT%: 51.4 % (ref 40.0–80.0)
Platelets: 115 10*3/uL — ABNORMAL LOW (ref 145–400)
RBC: 3.71 10*6/uL — ABNORMAL LOW (ref 4.20–5.70)
RDW: 13.4 % (ref 11.1–15.7)
WBC: 2.8 10*3/uL — ABNORMAL LOW (ref 4.0–10.0)

## 2016-10-01 MED ORDER — BORTEZOMIB CHEMO SQ INJECTION 3.5 MG (2.5MG/ML)
1.3000 mg/m2 | Freq: Once | INTRAMUSCULAR | Status: AC
  Administered 2016-10-01: 2.75 mg via SUBCUTANEOUS
  Filled 2016-10-01: qty 1.1

## 2016-10-01 NOTE — Patient Instructions (Signed)
[facility] Discharge Instructions for Patients Receiving Chemotherapy  Today you received the following chemotherapy agents Velcade. To help prevent nausea and vomiting after your treatment, we encourage you to take your nausea medication as directed.  If you develop nausea and vomiting that is not controlled by your nausea medication, call the clinic.   BELOW ARE SYMPTOMS THAT SHOULD BE REPORTED IMMEDIATELY:  *FEVER GREATER THAN 100.5 F  *CHILLS WITH OR WITHOUT FEVER  NAUSEA AND VOMITING THAT IS NOT CONTROLLED WITH YOUR NAUSEA MEDICATION  *UNUSUAL SHORTNESS OF BREATH  *UNUSUAL BRUISING OR BLEEDING  TENDERNESS IN MOUTH AND THROAT WITH OR WITHOUT PRESENCE OF ULCERS  *URINARY PROBLEMS  *BOWEL PROBLEMS  UNUSUAL RASH Items with * indicate a potential emergency and should be followed up as soon as possible.  Feel free to call the clinic you have any questions or concerns. The clinic phone number is [phone].  Please show the CHEMO ALERT CARD at check-in to the Emergency Department and triage nurse.    

## 2016-10-01 NOTE — Progress Notes (Signed)
Hematology and Oncology Follow Up Visit  Tyler Phillips [id number] 29-Jun-1965 51 y.o. 10/01/2016   Principle Diagnosis:   IgG Kappa myeloma  Current Therapy:  Status post autologous stem cell transplant on 05/22/2015   S/p Cycle #6 of RVD  Velcade q 3wk dosing/Revlimid 10mg  po q day (21/7) -      Interim History:  [name] is back for follow-up. The big news is that he and his family are headed to [place] for vacation this weekend. They will be there for a week.  He is doing well. He is working. He is exercising.  The family situation is doing so much better. This is taking a lot of stress off he and his wife.  His myeloma is doing fantastic. His last M spike was not observed. His IgG level was 9 53 mg/dL. His Kappa Lightchain was 2 mg/dL.   He has had no problems with fever. He's had no nausea or vomiting. He's had no rashes. He's had no pain. He's had no change in bowel or bladder habits.   Overall, his performance status is ECOG 0.  Medications:  Current Outpatient Prescriptions:  .  aspirin 325 MG tablet, Take 325 mg by mouth daily., Disp: , Rfl:  .  Calcium-Vitamins C & D (CALCIUM/C/D) 500-10-250 MG-MG-UNIT CHEW, Chew by mouth., Disp: , Rfl:  .  cetirizine (ZYRTEC) 10 MG tablet, Take 10 mg by mouth daily., Disp: , Rfl:  .  chlorhexidine (PERIDEX) 0.12 % solution, Use as directed 15 mLs in the mouth or throat 2 (two) times daily. , Disp: , Rfl: 0 .  Cholecalciferol (VITAMIN D3) 1000 units CAPS, Take by mouth., Disp: , Rfl:  .  clindamycin (CLEOCIN) 300 MG capsule, Take by mouth., Disp: , Rfl:  .  hydrOXYzine (ATARAX/VISTARIL) 25 MG tablet, Take 1 tablet (25 mg total) by mouth every 6 (six) hours as needed for itching., Disp: 30 tablet, Rfl: 0 .  lenalidomide (REVLIMID) 10 MG capsule, Take 1 capsule daily for 21 days on and 7 days off. GGYI#[id number], Disp: 21 capsule, Rfl: 0 .  LORazepam (ATIVAN) 1 MG tablet, Take 1 mg by mouth every 4 (four) hours as needed., Disp: , Rfl:   .  oxyCODONE (OXY IR/ROXICODONE) 5 MG immediate release tablet, Take 5 mg by mouth every 4 (four) hours as needed., Disp: , Rfl:  .  penicillin v potassium (VEETID) 500 MG tablet, TAKE ONE TABLET (500 MG TOTAL) BY MOUTH EVERY 6 HOURS FOR 7 DAYS., Disp: , Rfl: 0 .  prochlorperazine (COMPAZINE) 10 MG tablet, Take 10 mg by mouth every 6 (six) hours as needed., Disp: , Rfl:  .  valACYclovir (VALTREX) 500 MG tablet, Take 500 mg by mouth daily. , Disp: , Rfl:   Allergies: No Known Allergies  Past Medical History, Surgical history, Social history, and Family History were reviewed and updated.  Review of Systems: As above  Physical Exam:  weight is 221 lb (100.2 kg). His oral temperature is 98.4 F (36.9 C). His blood pressure is 126/87 and his pulse is 53 (abnormal). His respiration is 16 and oxygen saturation is 97%.   Wt Readings from Last 3 Encounters:  10/01/16 221 lb (100.2 kg)  09/10/16 221 lb 6.4 oz (100.4 kg)  08/02/16 220 lb 6.4 oz (100 kg)     Well-developed and well-nourished white male in no obvious distress. He has alopecia. Head and neck exam shows no ocular or oral lesions. There is some exposed mandibular bone in the left  inner mandibular table. There are no palpable cervical or supraclavicular lymph nodes. Lungs are clear. Cardiac exam regular rate and rhythm with no murmurs, rubs or bruits. Abdomen is soft. He has good bowel sounds. There is no fluid wave. There is no palpable hepatomegaly. His spleen tip is not palpable. Back exam shows no tenderness over the spine, ribs or hips. Extremities shows no clubbing, cyanosis or edema. He has good range of motion of his joints. He has good strength in his extremities. Has good pulses in his distal extremities.Marland Kitchen He may have some trace edema in his lower legs. Skin exam shows no rashes, ecchymoses or petechia.  Lab Results  Component Value Date   WBC 2.8 (L) 10/01/2016   HGB 13.2 10/01/2016   HCT 36.7 (L) 10/01/2016   MCV 99 (H)  10/01/2016   PLT 115 (L) 10/01/2016     Chemistry      Component Value Date/Time   NA 139 10/01/2016 0902   NA 138 09/03/2015 1042   K 4.0 10/01/2016 0902   K 4.3 09/03/2015 1042   CL 110 (H) 10/01/2016 0902   CO2 26 10/01/2016 0902   CO2 25 09/03/2015 1042   BUN 14 10/01/2016 0902   BUN 21.7 09/03/2015 1042   CREATININE 1.3 (H) 10/01/2016 0902   CREATININE 1.2 09/03/2015 1042      Component Value Date/Time   CALCIUM 8.3 10/01/2016 0902   CALCIUM 9.1 09/03/2015 1042   ALKPHOS 58 10/01/2016 0902   ALKPHOS 42 09/03/2015 1042   AST 33 10/01/2016 0902   AST 27 09/03/2015 1042   ALT 64 (H) 10/01/2016 0902   ALT 41 09/03/2015 1042   BILITOT 0.90 10/01/2016 0902   BILITOT 0.54 09/03/2015 1042         Impression and Plan: [name] is 51 year old gentleman with IgG Kappa myeloma. We finally got him to transplant. This was truly a process with him. It took almost a year to get him to transplant.  Of note, his pre-transplant bone marrow was done at [facility]. It showed 10% plasma cells.  He had flow cytometry which showed an atypical monoclonal cell population. His cytogenetics did not show any obvious chromosomal abnormalities. His FISH showed a t(14:16), +4, and +17p  I will go ahead and have him stop the Revlimid for right now. I think that he has been in remission for quite a while. I worry that his blood counts or taking too much of a "hit" in I do not want to see him compromised immunologically.  I do not see a problem with him stopping Revlimid.  He sees the transplant doctors at [facility] in about 6 weeks. I'm sure that if there is a problem or they want him back on Revlimid, they will let me know.  I do not see any problems with him going to [place] this weekend for vacation.  We'll go ahead and give Velcade today.   I will plan to see him back in 6 weeks. He will come in just for Velcade and lab work in 3 weeks.  [name], MD 6/15/20182:14 PM

## 2016-10-13 ENCOUNTER — Ambulatory Visit: Payer: BLUE CROSS/BLUE SHIELD

## 2016-10-13 ENCOUNTER — Other Ambulatory Visit: Payer: BLUE CROSS/BLUE SHIELD

## 2016-10-13 ENCOUNTER — Other Ambulatory Visit: Payer: Self-pay | Admitting: *Deleted

## 2016-10-13 DIAGNOSIS — C9002 Multiple myeloma in relapse: Secondary | ICD-10-CM

## 2016-10-13 MED ORDER — LENALIDOMIDE 10 MG PO CAPS: ORAL_CAPSULE | ORAL | 0 refills | Status: DC

## 2016-10-18 ENCOUNTER — Other Ambulatory Visit: Payer: Self-pay | Admitting: *Deleted

## 2016-10-21 ENCOUNTER — Other Ambulatory Visit: Payer: Self-pay | Admitting: *Deleted

## 2016-10-21 DIAGNOSIS — C9002 Multiple myeloma in relapse: Secondary | ICD-10-CM

## 2016-10-21 MED ORDER — LENALIDOMIDE 10 MG PO CAPS: ORAL_CAPSULE | ORAL | 0 refills | Status: DC

## 2016-10-22 ENCOUNTER — Ambulatory Visit (HOSPITAL_BASED_OUTPATIENT_CLINIC_OR_DEPARTMENT_OTHER): Payer: BLUE CROSS/BLUE SHIELD

## 2016-10-22 ENCOUNTER — Other Ambulatory Visit (HOSPITAL_BASED_OUTPATIENT_CLINIC_OR_DEPARTMENT_OTHER): Payer: BLUE CROSS/BLUE SHIELD

## 2016-10-22 ENCOUNTER — Ambulatory Visit (HOSPITAL_BASED_OUTPATIENT_CLINIC_OR_DEPARTMENT_OTHER): Payer: BLUE CROSS/BLUE SHIELD | Admitting: Hematology & Oncology

## 2016-10-22 VITALS — BP 124/80 | HR 66 | Temp 98.4°F | Resp 17 | Wt 220.0 lb

## 2016-10-22 DIAGNOSIS — C9001 Multiple myeloma in remission: Secondary | ICD-10-CM | POA: Diagnosis not present

## 2016-10-22 DIAGNOSIS — Z5112 Encounter for antineoplastic immunotherapy: Secondary | ICD-10-CM | POA: Diagnosis not present

## 2016-10-22 LAB — CMP (CANCER CENTER ONLY)
ALT(SGPT): 49 U/L — ABNORMAL HIGH (ref 10–47)
AST: 33 U/L (ref 11–38)
Albumin: 3.6 g/dL (ref 3.3–5.5)
Alkaline Phosphatase: 70 U/L (ref 26–84)
BUN, Bld: 11 mg/dL (ref 7–22)
CO2: 24 mEq/L (ref 18–33)
Calcium: 8.9 mg/dL (ref 8.0–10.3)
Chloride: 105 mEq/L (ref 98–108)
Creat: 1.4 mg/dl — ABNORMAL HIGH (ref 0.6–1.2)
Glucose, Bld: 106 mg/dL (ref 73–118)
Potassium: 4 mEq/L (ref 3.3–4.7)
Sodium: 138 mEq/L (ref 128–145)
Total Bilirubin: 0.7 mg/dl (ref 0.20–1.60)
Total Protein: 6.7 g/dL (ref 6.4–8.1)

## 2016-10-22 LAB — CBC WITH DIFFERENTIAL (CANCER CENTER ONLY)
BASO#: 0 10e3/uL (ref 0.0–0.2)
BASO%: 0 % (ref 0.0–2.0)
EOS%: 3.8 % (ref 0.0–7.0)
Eosinophils Absolute: 0.1 10e3/uL (ref 0.0–0.5)
HCT: 36.3 % — ABNORMAL LOW (ref 38.7–49.9)
HGB: 13.2 g/dL (ref 13.0–17.1)
LYMPH#: 0.7 10e3/uL — ABNORMAL LOW (ref 0.9–3.3)
LYMPH%: 27.4 % (ref 14.0–48.0)
MCH: 36.8 pg — ABNORMAL HIGH (ref 28.0–33.4)
MCHC: 36.4 g/dL — ABNORMAL HIGH (ref 32.0–35.9)
MCV: 101 fL — ABNORMAL HIGH (ref 82–98)
MONO#: 0.3 10e3/uL (ref 0.1–0.9)
MONO%: 11 % (ref 0.0–13.0)
NEUT#: 1.5 10e3/uL (ref 1.5–6.5)
NEUT%: 57.8 % (ref 40.0–80.0)
Platelets: 118 10e3/uL — ABNORMAL LOW (ref 145–400)
RBC: 3.59 10e6/uL — ABNORMAL LOW (ref 4.20–5.70)
RDW: 12.8 % (ref 11.1–15.7)
WBC: 2.6 10e3/uL — ABNORMAL LOW (ref 4.0–10.0)

## 2016-10-22 MED ORDER — BORTEZOMIB CHEMO SQ INJECTION 3.5 MG (2.5MG/ML)
1.3000 mg/m2 | Freq: Once | INTRAMUSCULAR | Status: AC
  Administered 2016-10-22: 2.75 mg via SUBCUTANEOUS
  Filled 2016-10-22: qty 2.75

## 2016-10-22 NOTE — Patient Instructions (Signed)
[facility] Discharge Instructions for Patients Receiving Chemotherapy  Today you received the following chemotherapy agents Velcade. To help prevent nausea and vomiting after your treatment, we encourage you to take your nausea medication as directed.  If you develop nausea and vomiting that is not controlled by your nausea medication, call the clinic.   BELOW ARE SYMPTOMS THAT SHOULD BE REPORTED IMMEDIATELY:  *FEVER GREATER THAN 100.5 F  *CHILLS WITH OR WITHOUT FEVER  NAUSEA AND VOMITING THAT IS NOT CONTROLLED WITH YOUR NAUSEA MEDICATION  *UNUSUAL SHORTNESS OF BREATH  *UNUSUAL BRUISING OR BLEEDING  TENDERNESS IN MOUTH AND THROAT WITH OR WITHOUT PRESENCE OF ULCERS  *URINARY PROBLEMS  *BOWEL PROBLEMS  UNUSUAL RASH Items with * indicate a potential emergency and should be followed up as soon as possible.  Feel free to call the clinic you have any questions or concerns. The clinic phone number is [phone].  Please show the CHEMO ALERT CARD at check-in to the Emergency Department and triage nurse.    

## 2016-10-22 NOTE — Progress Notes (Signed)
Hematology and Oncology Follow Up Visit  Tyler Phillips [id number] Feb 26, 1966 51 y.o. 10/22/2016   Principle Diagnosis:   IgG Kappa myeloma  Current Therapy:  Status post autologous stem cell transplant on 05/22/2015   S/p Cycle #6 of RVD  Velcade q 3wk dosing/Revlimid 10mg  po q day (21/7) -      Interim History:  [name] is back for follow-up. He has family had a great time in [place]. They went about 3 weeks ago. Had a superb time. They did a lot of activities. I saw some pictures of the took on their cell phone.   He is doing well. He is working. He is exercising.  The family situation is doing so much better. This is taking a lot of stress off he and his wife.  We stopped his Revlimid. I started because I felt that his white cell count was getting a little bit too low. He's been off it now for 3 weeks.  His myeloma is doing fantastic. His last M spike was not observed. His IgG level was 953 mg/dL. His Kappa Lightchain was 2 mg/dL.   He has had no problems with fever. He's had no nausea or vomiting. He's had no rashes. He's had no pain. He's had no change in bowel or bladder habits.   Overall, his performance status is ECOG 0.  Medications:  Current Outpatient Prescriptions:  .  aspirin 325 MG tablet, Take 325 mg by mouth daily., Disp: , Rfl:  .  Calcium-Vitamins C & D (CALCIUM/C/D) 500-10-250 MG-MG-UNIT CHEW, Chew by mouth., Disp: , Rfl:  .  cetirizine (ZYRTEC) 10 MG tablet, Take 10 mg by mouth daily., Disp: , Rfl:  .  chlorhexidine (PERIDEX) 0.12 % solution, Use as directed 15 mLs in the mouth or throat 2 (two) times daily. , Disp: , Rfl: 0 .  Cholecalciferol (VITAMIN D3) 1000 units CAPS, Take by mouth., Disp: , Rfl:  .  clindamycin (CLEOCIN) 300 MG capsule, Take by mouth., Disp: , Rfl:  .  hydrOXYzine (ATARAX/VISTARIL) 25 MG tablet, Take 1 tablet (25 mg total) by mouth every 6 (six) hours as needed for itching., Disp: 30 tablet, Rfl: 0 .  lenalidomide (REVLIMID) 10 MG  capsule, Take 1 capsule daily for 21 days on and 7 days off. PNTI#[id number], Disp: 21 capsule, Rfl: 0 .  LORazepam (ATIVAN) 1 MG tablet, Take 1 mg by mouth every 4 (four) hours as needed., Disp: , Rfl:  .  oxyCODONE (OXY IR/ROXICODONE) 5 MG immediate release tablet, Take 5 mg by mouth every 4 (four) hours as needed., Disp: , Rfl:  .  penicillin v potassium (VEETID) 500 MG tablet, TAKE ONE TABLET (500 MG TOTAL) BY MOUTH EVERY 6 HOURS FOR 7 DAYS., Disp: , Rfl: 0 .  prochlorperazine (COMPAZINE) 10 MG tablet, Take 10 mg by mouth every 6 (six) hours as needed., Disp: , Rfl:  .  valACYclovir (VALTREX) 500 MG tablet, Take 500 mg by mouth daily. , Disp: , Rfl:   Allergies: No Known Allergies  Past Medical History, Surgical history, Social history, and Family History were reviewed and updated.  Review of Systems: As above  Physical Exam:  weight is 220 lb (99.8 kg). His oral temperature is 98.4 F (36.9 C). His blood pressure is 124/80 and his pulse is 66. His respiration is 17 and oxygen saturation is 99%.   Wt Readings from Last 3 Encounters:  10/22/16 220 lb (99.8 kg)  10/01/16 221 lb (100.2 kg)  09/10/16 221 lb  6.4 oz (100.4 kg)     Well-developed and well-nourished white male in no obvious distress. He has alopecia. Head and neck exam shows no ocular or oral lesions. There is some exposed mandibular bone in the left inner mandibular table. There are no palpable cervical or supraclavicular lymph nodes. Lungs are clear. Cardiac exam regular rate and rhythm with no murmurs, rubs or bruits. Abdomen is soft. He has good bowel sounds. There is no fluid wave. There is no palpable hepatomegaly. His spleen tip is not palpable. Back exam shows no tenderness over the spine, ribs or hips. Extremities shows no clubbing, cyanosis or edema. He has good range of motion of his joints. He has good strength in his extremities. Has good pulses in his distal extremities.Marland Kitchen He may have some trace edema in his lower  legs. Skin exam shows no rashes, ecchymoses or petechia.  Lab Results  Component Value Date   WBC 2.6 (L) 10/22/2016   HGB 13.2 10/22/2016   HCT 36.3 (L) 10/22/2016   MCV 101 (H) 10/22/2016   PLT 118 (L) 10/22/2016     Chemistry      Component Value Date/Time   NA 138 10/22/2016 0857   NA 138 09/03/2015 1042   K 4.0 10/22/2016 0857   K 4.3 09/03/2015 1042   CL 105 10/22/2016 0857   CO2 24 10/22/2016 0857   CO2 25 09/03/2015 1042   BUN 11 10/22/2016 0857   BUN 21.7 09/03/2015 1042   CREATININE 1.4 (H) 10/22/2016 0857   CREATININE 1.2 09/03/2015 1042      Component Value Date/Time   CALCIUM 8.9 10/22/2016 0857   CALCIUM 9.1 09/03/2015 1042   ALKPHOS 70 10/22/2016 0857   ALKPHOS 42 09/03/2015 1042   AST 33 10/22/2016 0857   AST 27 09/03/2015 1042   ALT 49 (H) 10/22/2016 0857   ALT 41 09/03/2015 1042   BILITOT 0.70 10/22/2016 0857   BILITOT 0.54 09/03/2015 1042         Impression and Plan: [name] is 51 year old gentleman with IgG Kappa myeloma. We finally got him to transplant. This was truly a process with him. It took almost a year to get him to transplant.  Of note, his pre-transplant bone marrow was [facility] at [facility]. It showed 10% plasma cells.  He had flow cytometry which showed an atypical monoclonal cell population. His cytogenetics did not show any obvious chromosomal abnormalities. His FISH showed a t(14:16), +4, and +17p  I will go ahead and have him stop the Revlimid for right now. I think that he has been in remission for quite a while. I worry that his blood counts or taking too much of a "hit" in I do not want to see him compromised immunologically.  I do not see a problem with keeping him off Revlimid.  He sees the transplant doctors at [facility] in about 6 weeks. I'm sure that if there is a problem or they want him back on Revlimid, they will let me know.  I'm so glad that he had a great time in [place].  We'll go ahead and give Velcade  today.   I will plan to see him back in 6 weeks. He will come in just for Velcade and lab work in 3 weeks.  [name], MD 7/6/20189:48 AM

## 2016-10-23 LAB — IGG, IGA, IGM
IgA, Qn, Serum: 104 mg/dL (ref 90–386)
IgG, Qn, Serum: 959 mg/dL (ref 700–1600)
IgM, Qn, Serum: 20 mg/dL (ref 20–172)

## 2016-10-25 LAB — KAPPA/LAMBDA LIGHT CHAINS
Ig Kappa Free Light Chain: 19.2 mg/L (ref 3.3–19.4)
Ig Lambda Free Light Chain: 14.9 mg/L (ref 5.7–26.3)
Kappa/Lambda FluidC Ratio: 1.29 (ref 0.26–1.65)

## 2016-10-26 LAB — PROTEIN ELECTROPHORESIS, SERUM, WITH REFLEX
A/G Ratio: 1.5 (ref 0.7–1.7)
Albumin: 4 g/dL (ref 2.9–4.4)
Alpha 1: 0.1 g/dL (ref 0.0–0.4)
Alpha 2: 0.7 g/dL (ref 0.4–1.0)
Beta: 0.9 g/dL (ref 0.7–1.3)
Gamma Globulin: 1 g/dL (ref 0.4–1.8)
Globulin, Total: 2.6 g/dL (ref 2.2–3.9)
Total Protein: 6.6 g/dL (ref 6.0–8.5)

## 2016-10-28 ENCOUNTER — Encounter: Payer: Self-pay | Admitting: *Deleted

## 2016-11-04 ENCOUNTER — Other Ambulatory Visit: Payer: BLUE CROSS/BLUE SHIELD

## 2016-11-04 DIAGNOSIS — C9000 Multiple myeloma not having achieved remission: Secondary | ICD-10-CM | POA: Diagnosis not present

## 2016-11-12 ENCOUNTER — Ambulatory Visit: Payer: BLUE CROSS/BLUE SHIELD

## 2016-11-12 ENCOUNTER — Other Ambulatory Visit: Payer: BLUE CROSS/BLUE SHIELD

## 2016-11-17 DIAGNOSIS — Z9481 Bone marrow transplant status: Secondary | ICD-10-CM | POA: Diagnosis not present

## 2016-11-17 DIAGNOSIS — C9001 Multiple myeloma in remission: Secondary | ICD-10-CM | POA: Diagnosis not present

## 2016-11-18 ENCOUNTER — Other Ambulatory Visit: Payer: Self-pay | Admitting: *Deleted

## 2016-11-18 DIAGNOSIS — C9001 Multiple myeloma in remission: Secondary | ICD-10-CM

## 2016-11-19 ENCOUNTER — Ambulatory Visit (HOSPITAL_BASED_OUTPATIENT_CLINIC_OR_DEPARTMENT_OTHER): Payer: BLUE CROSS/BLUE SHIELD

## 2016-11-19 ENCOUNTER — Other Ambulatory Visit (HOSPITAL_BASED_OUTPATIENT_CLINIC_OR_DEPARTMENT_OTHER): Payer: BLUE CROSS/BLUE SHIELD

## 2016-11-19 VITALS — BP 113/74 | HR 58 | Temp 98.0°F | Resp 17

## 2016-11-19 DIAGNOSIS — C9001 Multiple myeloma in remission: Secondary | ICD-10-CM

## 2016-11-19 DIAGNOSIS — Z5112 Encounter for antineoplastic immunotherapy: Secondary | ICD-10-CM

## 2016-11-19 LAB — CBC WITH DIFFERENTIAL (CANCER CENTER ONLY)
BASO#: 0 10*3/uL (ref 0.0–0.2)
BASO%: 0.2 % (ref 0.0–2.0)
EOS%: 1.5 % (ref 0.0–7.0)
Eosinophils Absolute: 0.1 10*3/uL (ref 0.0–0.5)
HCT: 35.9 % — ABNORMAL LOW (ref 38.7–49.9)
HGB: 13.1 g/dL (ref 13.0–17.1)
LYMPH#: 0.9 10*3/uL (ref 0.9–3.3)
LYMPH%: 22.9 % (ref 14.0–48.0)
MCH: 37.1 pg — ABNORMAL HIGH (ref 28.0–33.4)
MCHC: 36.5 g/dL — ABNORMAL HIGH (ref 32.0–35.9)
MCV: 102 fL — ABNORMAL HIGH (ref 82–98)
MONO#: 0.3 10*3/uL (ref 0.1–0.9)
MONO%: 7.8 % (ref 0.0–13.0)
NEUT#: 2.8 10*3/uL (ref 1.5–6.5)
NEUT%: 67.6 % (ref 40.0–80.0)
Platelets: 155 10*3/uL (ref 145–400)
RBC: 3.53 10*6/uL — ABNORMAL LOW (ref 4.20–5.70)
RDW: 13.4 % (ref 11.1–15.7)
WBC: 4.1 10*3/uL (ref 4.0–10.0)

## 2016-11-19 LAB — CMP (CANCER CENTER ONLY)
ALT(SGPT): 51 U/L — ABNORMAL HIGH (ref 10–47)
AST: 33 U/L (ref 11–38)
Albumin: 3.7 g/dL (ref 3.3–5.5)
Alkaline Phosphatase: 73 U/L (ref 26–84)
BUN, Bld: 18 mg/dL (ref 7–22)
CO2: 24 mEq/L (ref 18–33)
Calcium: 9.2 mg/dL (ref 8.0–10.3)
Chloride: 109 mEq/L — ABNORMAL HIGH (ref 98–108)
Creat: 1.2 mg/dl (ref 0.6–1.2)
Glucose, Bld: 132 mg/dL — ABNORMAL HIGH (ref 73–118)
Potassium: 4.1 mEq/L (ref 3.3–4.7)
Sodium: 137 mEq/L (ref 128–145)
Total Bilirubin: 0.8 mg/dl (ref 0.20–1.60)
Total Protein: 6.9 g/dL (ref 6.4–8.1)

## 2016-11-19 MED ORDER — BORTEZOMIB CHEMO SQ INJECTION 3.5 MG (2.5MG/ML)
1.3000 mg/m2 | Freq: Once | INTRAMUSCULAR | Status: AC
  Administered 2016-11-19: 2.75 mg via SUBCUTANEOUS
  Filled 2016-11-19: qty 2.75

## 2016-11-19 NOTE — Patient Instructions (Signed)
[facility] Discharge Instructions for Patients Receiving Chemotherapy  Today you received the following chemotherapy agents Velcade. To help prevent nausea and vomiting after your treatment, we encourage you to take your nausea medication as directed.  If you develop nausea and vomiting that is not controlled by your nausea medication, call the clinic.   BELOW ARE SYMPTOMS THAT SHOULD BE REPORTED IMMEDIATELY:  *FEVER GREATER THAN 100.5 F  *CHILLS WITH OR WITHOUT FEVER  NAUSEA AND VOMITING THAT IS NOT CONTROLLED WITH YOUR NAUSEA MEDICATION  *UNUSUAL SHORTNESS OF BREATH  *UNUSUAL BRUISING OR BLEEDING  TENDERNESS IN MOUTH AND THROAT WITH OR WITHOUT PRESENCE OF ULCERS  *URINARY PROBLEMS  *BOWEL PROBLEMS  UNUSUAL RASH Items with * indicate a potential emergency and should be followed up as soon as possible.  Feel free to call the clinic you have any questions or concerns. The clinic phone number is [phone].  Please show the CHEMO ALERT CARD at check-in to the Emergency Department and triage nurse.    

## 2016-12-02 ENCOUNTER — Other Ambulatory Visit: Payer: Self-pay | Admitting: *Deleted

## 2016-12-02 DIAGNOSIS — C9001 Multiple myeloma in remission: Secondary | ICD-10-CM

## 2016-12-03 ENCOUNTER — Ambulatory Visit: Payer: BLUE CROSS/BLUE SHIELD | Admitting: Family

## 2016-12-03 ENCOUNTER — Ambulatory Visit: Payer: BLUE CROSS/BLUE SHIELD

## 2016-12-03 ENCOUNTER — Ambulatory Visit (HOSPITAL_BASED_OUTPATIENT_CLINIC_OR_DEPARTMENT_OTHER): Payer: BLUE CROSS/BLUE SHIELD

## 2016-12-03 ENCOUNTER — Other Ambulatory Visit: Payer: BLUE CROSS/BLUE SHIELD

## 2016-12-03 ENCOUNTER — Other Ambulatory Visit (HOSPITAL_BASED_OUTPATIENT_CLINIC_OR_DEPARTMENT_OTHER): Payer: BLUE CROSS/BLUE SHIELD

## 2016-12-03 VITALS — BP 130/73 | HR 80 | Temp 98.6°F | Resp 16 | Wt 221.2 lb

## 2016-12-03 DIAGNOSIS — C9001 Multiple myeloma in remission: Secondary | ICD-10-CM | POA: Diagnosis not present

## 2016-12-03 DIAGNOSIS — Z5112 Encounter for antineoplastic immunotherapy: Secondary | ICD-10-CM

## 2016-12-03 LAB — CBC WITH DIFFERENTIAL (CANCER CENTER ONLY)
BASO#: 0 10*3/uL (ref 0.0–0.2)
BASO%: 0.3 % (ref 0.0–2.0)
EOS%: 1.7 % (ref 0.0–7.0)
Eosinophils Absolute: 0.1 10*3/uL (ref 0.0–0.5)
HCT: 36.4 % — ABNORMAL LOW (ref 38.7–49.9)
HGB: 13.1 g/dL (ref 13.0–17.1)
LYMPH#: 0.8 10*3/uL — ABNORMAL LOW (ref 0.9–3.3)
LYMPH%: 11.9 % — ABNORMAL LOW (ref 14.0–48.0)
MCH: 36.8 pg — ABNORMAL HIGH (ref 28.0–33.4)
MCHC: 36 g/dL — ABNORMAL HIGH (ref 32.0–35.9)
MCV: 102 fL — ABNORMAL HIGH (ref 82–98)
MONO#: 0.6 10*3/uL (ref 0.1–0.9)
MONO%: 8.7 % (ref 0.0–13.0)
NEUT#: 5.4 10*3/uL (ref 1.5–6.5)
NEUT%: 77.4 % (ref 40.0–80.0)
Platelets: 138 10*3/uL — ABNORMAL LOW (ref 145–400)
RBC: 3.56 10*6/uL — ABNORMAL LOW (ref 4.20–5.70)
RDW: 13.2 % (ref 11.1–15.7)
WBC: 7 10*3/uL (ref 4.0–10.0)

## 2016-12-03 LAB — CMP (CANCER CENTER ONLY)
ALT(SGPT): 84 U/L — ABNORMAL HIGH (ref 10–47)
AST: 43 U/L — ABNORMAL HIGH (ref 11–38)
Albumin: 4.1 g/dL (ref 3.3–5.5)
Alkaline Phosphatase: 59 U/L (ref 26–84)
BUN, Bld: 12 mg/dL (ref 7–22)
CO2: 27 mEq/L (ref 18–33)
Calcium: 9 mg/dL (ref 8.0–10.3)
Chloride: 108 mEq/L (ref 98–108)
Creat: 1.6 mg/dl — ABNORMAL HIGH (ref 0.6–1.2)
Glucose, Bld: 121 mg/dL — ABNORMAL HIGH (ref 73–118)
Potassium: 4.2 mEq/L (ref 3.3–4.7)
Sodium: 142 mEq/L (ref 128–145)
Total Bilirubin: 1 mg/dl (ref 0.20–1.60)
Total Protein: 7.1 g/dL (ref 6.4–8.1)

## 2016-12-03 MED ORDER — BORTEZOMIB CHEMO SQ INJECTION 3.5 MG (2.5MG/ML)
1.3000 mg/m2 | Freq: Once | INTRAMUSCULAR | Status: AC
  Administered 2016-12-03: 2.75 mg via SUBCUTANEOUS
  Filled 2016-12-03: qty 2.75

## 2016-12-03 NOTE — Progress Notes (Signed)
OK to treat with Creatinine value today per MD [name]

## 2016-12-10 ENCOUNTER — Other Ambulatory Visit: Payer: BLUE CROSS/BLUE SHIELD

## 2016-12-10 ENCOUNTER — Ambulatory Visit: Payer: BLUE CROSS/BLUE SHIELD | Admitting: Hematology & Oncology

## 2016-12-10 ENCOUNTER — Ambulatory Visit: Payer: BLUE CROSS/BLUE SHIELD

## 2016-12-13 ENCOUNTER — Other Ambulatory Visit: Payer: Self-pay | Admitting: *Deleted

## 2016-12-13 DIAGNOSIS — C9002 Multiple myeloma in relapse: Secondary | ICD-10-CM

## 2016-12-13 MED ORDER — LENALIDOMIDE 10 MG PO CAPS: ORAL_CAPSULE | ORAL | 0 refills | Status: DC

## 2016-12-16 ENCOUNTER — Other Ambulatory Visit: Payer: Self-pay | Admitting: *Deleted

## 2016-12-16 DIAGNOSIS — C9001 Multiple myeloma in remission: Secondary | ICD-10-CM

## 2016-12-17 ENCOUNTER — Other Ambulatory Visit (HOSPITAL_BASED_OUTPATIENT_CLINIC_OR_DEPARTMENT_OTHER): Payer: BLUE CROSS/BLUE SHIELD

## 2016-12-17 ENCOUNTER — Ambulatory Visit (HOSPITAL_BASED_OUTPATIENT_CLINIC_OR_DEPARTMENT_OTHER): Payer: BLUE CROSS/BLUE SHIELD

## 2016-12-17 VITALS — BP 129/80 | HR 72 | Temp 98.2°F | Resp 20

## 2016-12-17 DIAGNOSIS — C9001 Multiple myeloma in remission: Secondary | ICD-10-CM

## 2016-12-17 DIAGNOSIS — Z23 Encounter for immunization: Secondary | ICD-10-CM

## 2016-12-17 DIAGNOSIS — Z5112 Encounter for antineoplastic immunotherapy: Secondary | ICD-10-CM | POA: Diagnosis not present

## 2016-12-17 LAB — CBC WITH DIFFERENTIAL (CANCER CENTER ONLY)
BASO#: 0 10*3/uL (ref 0.0–0.2)
BASO%: 0.2 % (ref 0.0–2.0)
EOS%: 0.2 % (ref 0.0–7.0)
Eosinophils Absolute: 0 10*3/uL (ref 0.0–0.5)
HCT: 35.4 % — ABNORMAL LOW (ref 38.7–49.9)
HGB: 12.9 g/dL — ABNORMAL LOW (ref 13.0–17.1)
LYMPH#: 0.7 10*3/uL — ABNORMAL LOW (ref 0.9–3.3)
LYMPH%: 17.2 % (ref 14.0–48.0)
MCH: 37 pg — ABNORMAL HIGH (ref 28.0–33.4)
MCHC: 36.4 g/dL — ABNORMAL HIGH (ref 32.0–35.9)
MCV: 101 fL — ABNORMAL HIGH (ref 82–98)
MONO#: 0.4 10*3/uL (ref 0.1–0.9)
MONO%: 8.4 % (ref 0.0–13.0)
NEUT#: 3.1 10*3/uL (ref 1.5–6.5)
NEUT%: 74 % (ref 40.0–80.0)
Platelets: 163 10*3/uL (ref 145–400)
RBC: 3.49 10*6/uL — ABNORMAL LOW (ref 4.20–5.70)
RDW: 12.9 % (ref 11.1–15.7)
WBC: 4.2 10*3/uL (ref 4.0–10.0)

## 2016-12-17 LAB — CMP (CANCER CENTER ONLY)
ALT(SGPT): 50 U/L — ABNORMAL HIGH (ref 10–47)
AST: 42 U/L — ABNORMAL HIGH (ref 11–38)
Albumin: 4 g/dL (ref 3.3–5.5)
Alkaline Phosphatase: 69 U/L (ref 26–84)
BUN, Bld: 18 mg/dL (ref 7–22)
CO2: 24 mEq/L (ref 18–33)
Calcium: 8.7 mg/dL (ref 8.0–10.3)
Chloride: 107 mEq/L (ref 98–108)
Creat: 1.5 mg/dl — ABNORMAL HIGH (ref 0.6–1.2)
Glucose, Bld: 140 mg/dL — ABNORMAL HIGH (ref 73–118)
Potassium: 4.4 mEq/L (ref 3.3–4.7)
Sodium: 139 mEq/L (ref 128–145)
Total Bilirubin: 0.8 mg/dl (ref 0.20–1.60)
Total Protein: 7.3 g/dL (ref 6.4–8.1)

## 2016-12-17 MED ORDER — ZOSTER VAC RECOMB ADJUVANTED 50 MCG/0.5ML IM SUSR
0.5000 mL | Freq: Once | INTRAMUSCULAR | Status: AC
  Administered 2016-12-17: 0.5 mL via INTRAMUSCULAR
  Filled 2016-12-17: qty 0.5

## 2016-12-17 MED ORDER — ZOSTER VAC RECOMB ADJUVANTED 50 MCG/0.5ML IM SUSR
0.5000 mL | Freq: Once | INTRAMUSCULAR | Status: AC
  Filled 2016-12-17: qty 0.5

## 2016-12-17 MED ORDER — BORTEZOMIB CHEMO SQ INJECTION 3.5 MG (2.5MG/ML)
1.3000 mg/m2 | Freq: Once | INTRAMUSCULAR | Status: AC
  Administered 2016-12-17: 2.75 mg via SUBCUTANEOUS
  Filled 2016-12-17: qty 2.75

## 2016-12-17 NOTE — Patient Instructions (Signed)
Recombinant Zoster (Shingles) Vaccine, RZV: What You Need to Know    1. Why get vaccinated?  Shingles (also called herpes zoster, or just zoster) is a painful skin rash, often with blisters. Shingles is caused by the varicella zoster virus, the same virus that causes chickenpox. After you have chickenpox, the virus stays in your body and can cause shingles later in life.  You can't catch shingles from another person. However, a person who has never had chickenpox (or chickenpox vaccine) could get chickenpox from someone with shingles.  A shingles rash usually appears on one side of the face or body and heals within 2 to 4 weeks. Its main symptom is pain, which can be severe. Other symptoms can include fever, headache, chills and upset stomach. Very rarely, a shingles infection can lead to pneumonia, hearing problems, blindness, brain inflammation (encephalitis), or death.  For about 1 person in 5, severe pain can continue even long after the rash has cleared up. This long-lasting pain is called post-herpetic neuralgia (PHN).  Shingles is far more common in people 50 years of age and older than in younger people, and the risk increases with age. It is also more common in people whose immune system is weakened because of a disease such as cancer, or by drugs such as steroids or chemotherapy.  At least 1 million people a year in the [place] get shingles.    2. Shingles vaccine (recombinant)  Recombinant shingles vaccine was approved by FDA in 2017 for the prevention of shingles. In clinical trials, it was more than 90% effective in preventing shingles. It can also reduce the likelihood of PHN.  Two doses, 2 to 6 months apart, are recommended for adults 50 and older.  This vaccine is also recommended for people who have already gotten the live shingles vaccine (Zostavax). There is no live virus in this vaccine.    3. Some people should not get this vaccine  Tell your vaccine provider if you:   Have any  severe, life-threatening allergies. A person who has ever had a life-threatening allergic reaction after a dose of recombinant shingles vaccine, or has a severe allergy to any component of this vaccine, may be advised not to be vaccinated. Ask your health care provider if you want information about vaccine components.   Are pregnant or breastfeeding. There is not much information about use of recombinant shingles vaccine in pregnant or nursing women. Your healthcare provider might recommend delaying vaccination.   Are not feeling well. If you have a mild illness, such as a cold, you can probably get the vaccine today. If you are moderately or severely ill, you should probably wait until you recover. Your doctor can advise you.    4. Risks of a vaccine reaction  With any medicine, including vaccines, there is a chance of reactions.  After recombinant shingles vaccination, a person might experience:   Pain, redness, soreness, or swelling at the site of the injection   Headache, muscle aches, fever, shivering, fatigue    In clinical trials, most people got a sore arm with mild or moderate pain after vaccination, and some also had redness and swelling where they got the shot. Some people felt tired, had muscle pain, a headache, shivering, fever, stomach pain, or nausea. About 1 out of 6 people who got recombinant zoster vaccine experienced side effects that prevented them from doing regular activities. Symptoms went away on their own in about 2 to 3 days. Side effects were more   common in younger people.  You should still get the second dose of recombinant zoster vaccine even if you had one of these reactions after the first dose.    Other things that could happen after this vaccine:   People sometimes faint after medical procedures, including vaccination. Sitting or lying down for about 15 minutes can help prevent fainting and injuries caused by a fall. Tell your provider if you feel dizzy or have vision changes or  ringing in the ears.   Some people get shoulder pain that can be more severe and longer-lasting than routine soreness that can follow injections. This happens very rarely.   Any medication can cause a severe allergic reaction. Such reactions to a vaccine are estimated at about 1 in a million doses, and would happen within a few minutes to a few hours after the vaccination.  As with any medicine, there is a very remote chance of a vaccine causing a serious injury or death.  The safety of vaccines is always being monitored. For more information, visit: www.cdc.gov/vaccinesafety/    5. What if there is a serious problem?  What should I look for?   Look for anything that concerns you, such as signs of a severe allergic reaction, very high fever, or unusual behavior.  Signs of a severe allergic reaction can include hives, swelling of the face and throat, difficulty breathing, a fast heartbeat, dizziness, and weakness. These would usually start a few minutes to a few hours after the vaccination.    What should I do?   If you think it is a severe allergic reaction or other emergency that can't wait, call 9-1-1 and get to the nearest hospital. Otherwise, call your health care provider.  Afterward, the reaction should be reported to the Vaccine Adverse Event Reporting System (VAERS). Your doctor should file this report, or you can do it yourself through the VAERS web site atwww.vaers.hhs.govor by calling 1-800-822-7967.  VAERS does not give medical advice.    6. How can I learn more?   Ask your healthcare provider. He or she can give you the vaccine package insert or suggest other sources of information.   Call your local or state health department.   Contact the Centers for Disease Control and Prevention (CDC):  ? Call 1-800-232-4636 (1-800-CDC-INFO) or  ? Visit the CDC's website at www.cdc.gov/vaccines  ?   CDC Vaccine Information Statement (VIS) Recombinant Zoster Vaccine (05/31/2016)  This information is not  intended to replace advice given to you by your health care provider. Make sure you discuss any questions you have with your health care provider.  Document Released: 06/15/2016 Document Revised: 06/15/2016 Document Reviewed: 06/15/2016  Elsevier Interactive Patient Education  2018 [employer] Inc[employer]

## 2016-12-24 ENCOUNTER — Ambulatory Visit: Payer: BLUE CROSS/BLUE SHIELD

## 2016-12-24 ENCOUNTER — Other Ambulatory Visit: Payer: BLUE CROSS/BLUE SHIELD

## 2016-12-29 ENCOUNTER — Other Ambulatory Visit: Payer: Self-pay | Admitting: *Deleted

## 2016-12-29 DIAGNOSIS — C9002 Multiple myeloma in relapse: Secondary | ICD-10-CM

## 2016-12-29 MED ORDER — LENALIDOMIDE 10 MG PO CAPS: ORAL_CAPSULE | ORAL | 0 refills | Status: DC

## 2016-12-31 ENCOUNTER — Ambulatory Visit (HOSPITAL_BASED_OUTPATIENT_CLINIC_OR_DEPARTMENT_OTHER): Payer: BLUE CROSS/BLUE SHIELD | Admitting: Hematology & Oncology

## 2016-12-31 ENCOUNTER — Other Ambulatory Visit: Payer: BLUE CROSS/BLUE SHIELD

## 2016-12-31 ENCOUNTER — Ambulatory Visit (HOSPITAL_BASED_OUTPATIENT_CLINIC_OR_DEPARTMENT_OTHER): Payer: BLUE CROSS/BLUE SHIELD

## 2016-12-31 ENCOUNTER — Other Ambulatory Visit (HOSPITAL_BASED_OUTPATIENT_CLINIC_OR_DEPARTMENT_OTHER): Payer: BLUE CROSS/BLUE SHIELD

## 2016-12-31 ENCOUNTER — Ambulatory Visit: Payer: BLUE CROSS/BLUE SHIELD

## 2016-12-31 VITALS — BP 124/79 | HR 67 | Temp 97.5°F | Resp 18 | Wt 222.0 lb

## 2016-12-31 DIAGNOSIS — C9001 Multiple myeloma in remission: Secondary | ICD-10-CM | POA: Diagnosis not present

## 2016-12-31 DIAGNOSIS — Z5112 Encounter for antineoplastic immunotherapy: Secondary | ICD-10-CM

## 2016-12-31 DIAGNOSIS — R5383 Other fatigue: Secondary | ICD-10-CM

## 2016-12-31 DIAGNOSIS — Z9484 Stem cells transplant status: Secondary | ICD-10-CM

## 2016-12-31 LAB — CBC WITH DIFFERENTIAL (CANCER CENTER ONLY)
BASO#: 0 10*3/uL (ref 0.0–0.2)
BASO%: 0.8 % (ref 0.0–2.0)
EOS%: 7.8 % — ABNORMAL HIGH (ref 0.0–7.0)
Eosinophils Absolute: 0.3 10*3/uL (ref 0.0–0.5)
HCT: 35.7 % — ABNORMAL LOW (ref 38.7–49.9)
HGB: 12.9 g/dL — ABNORMAL LOW (ref 13.0–17.1)
LYMPH#: 0.9 10*3/uL (ref 0.9–3.3)
LYMPH%: 23 % (ref 14.0–48.0)
MCH: 36.9 pg — ABNORMAL HIGH (ref 28.0–33.4)
MCHC: 36.1 g/dL — ABNORMAL HIGH (ref 32.0–35.9)
MCV: 102 fL — ABNORMAL HIGH (ref 82–98)
MONO#: 0.5 10*3/uL (ref 0.1–0.9)
MONO%: 12.7 % (ref 0.0–13.0)
NEUT#: 2.1 10*3/uL (ref 1.5–6.5)
NEUT%: 55.7 % (ref 40.0–80.0)
Platelets: 139 10*3/uL — ABNORMAL LOW (ref 145–400)
RBC: 3.5 10*6/uL — ABNORMAL LOW (ref 4.20–5.70)
RDW: 12.5 % (ref 11.1–15.7)
WBC: 3.7 10*3/uL — ABNORMAL LOW (ref 4.0–10.0)

## 2016-12-31 LAB — CMP (CANCER CENTER ONLY)
ALT(SGPT): 62 U/L — ABNORMAL HIGH (ref 10–47)
AST: 35 U/L (ref 11–38)
Albumin: 3.5 g/dL (ref 3.3–5.5)
Alkaline Phosphatase: 62 U/L (ref 26–84)
BUN, Bld: 14 mg/dL (ref 7–22)
CO2: 26 mEq/L (ref 18–33)
Calcium: 8.6 mg/dL (ref 8.0–10.3)
Chloride: 106 mEq/L (ref 98–108)
Creat: 1.4 mg/dl — ABNORMAL HIGH (ref 0.6–1.2)
Glucose, Bld: 127 mg/dL — ABNORMAL HIGH (ref 73–118)
Potassium: 4 mEq/L (ref 3.3–4.7)
Sodium: 141 mEq/L (ref 128–145)
Total Bilirubin: 0.7 mg/dl (ref 0.20–1.60)
Total Protein: 6.7 g/dL (ref 6.4–8.1)

## 2016-12-31 MED ORDER — BORTEZOMIB CHEMO SQ INJECTION 3.5 MG (2.5MG/ML)
3.0000 mg | Freq: Once | INTRAMUSCULAR | Status: AC
  Administered 2016-12-31: 3 mg via SUBCUTANEOUS
  Filled 2016-12-31: qty 3

## 2016-12-31 NOTE — Progress Notes (Signed)
Hematology and Oncology Follow Up Visit  Tyler Phillips [id number] 1965/06/10 51 y.o. 12/31/2016   Principle Diagnosis:   IgG Kappa myeloma  Current Therapy:  Status post autologous stem cell transplant on 05/22/2015   S/p Cycle #6 of RVD  Velcade q 2wk dosing/Revlimid 10mg  po q day (21/7) -      Interim History:  [name] is back for follow-up.he was seen at [facility] a few weeks ago. Dr. Laverta Baltimore want him on every two-week Velcade. We will subsequently increase the frequency of Velcade.  He is gaining a little bit of weight. We'll also increase the dose of Velcade.  He's had no issues with tingling in the hands or feet.  He's had no problems with bowels or bladder. He's had no cough. He is still working.  He has had no leg swelling. He has had a little bit of a rash with the Revlimid. He's been doing some Zyrtec to help. I told him that we could try Singulair if he has problems.  His last myeloma studies showed no measurable M spike. His IgG level was 959 mg/dL. His Kappa Lightchain was 1.9 mg/dL.   He's had no bleeding.   Appetite has been doing pretty well. He's not been able to work out like he would want to. He does feel more fatigued. He feels like he does not have as much energy. I just wonder if his testosterone level is low. We will check a testosterone level on him.   Work seems to be getting in the way of his exercising.   Overall, his performance status is ECOG 0    Medications:  Current Outpatient Prescriptions:  .  aspirin 325 MG tablet, Take 325 mg by mouth daily., Disp: , Rfl:  .  Calcium-Vitamins C & D (CALCIUM/C/D) 500-10-250 MG-MG-UNIT CHEW, Chew by mouth., Disp: , Rfl:  .  cetirizine (ZYRTEC) 10 MG tablet, Take 10 mg by mouth daily., Disp: , Rfl:  .  chlorhexidine (PERIDEX) 0.12 % solution, Use as directed 15 mLs in the mouth or throat 2 (two) times daily. , Disp: , Rfl: 0 .  Cholecalciferol (VITAMIN D3) 1000 units CAPS, Take by mouth., Disp: , Rfl:  .   clindamycin (CLEOCIN) 300 MG capsule, Take by mouth., Disp: , Rfl:  .  hydrOXYzine (ATARAX/VISTARIL) 25 MG tablet, Take 1 tablet (25 mg total) by mouth every 6 (six) hours as needed for itching., Disp: 30 tablet, Rfl: 0 .  lenalidomide (REVLIMID) 10 MG capsule, Take 1 capsule daily for 21 days on and 7 days off. SAYT#[id number], Disp: 21 capsule, Rfl: 0 .  LORazepam (ATIVAN) 1 MG tablet, Take 1 mg by mouth every 4 (four) hours as needed., Disp: , Rfl:  .  oxyCODONE (OXY IR/ROXICODONE) 5 MG immediate release tablet, Take 5 mg by mouth every 4 (four) hours as needed., Disp: , Rfl:  .  penicillin v potassium (VEETID) 500 MG tablet, TAKE ONE TABLET (500 MG TOTAL) BY MOUTH EVERY 6 HOURS FOR 7 DAYS., Disp: , Rfl: 0 .  prochlorperazine (COMPAZINE) 10 MG tablet, Take 10 mg by mouth every 6 (six) hours as needed., Disp: , Rfl:  .  valACYclovir (VALTREX) 500 MG tablet, Take 500 mg by mouth daily. , Disp: , Rfl:   Allergies: No Known Allergies  Past Medical History, Surgical history, Social history, and Family History were reviewed and updated.  Review of Systems: As stated in the interim history  Physical Exam:  weight is 222 lb (100.7 kg). His oral  temperature is 97.5 F (36.4 C) (abnormal). His blood pressure is 124/79 and his pulse is 67. His respiration is 18 and oxygen saturation is 96%.   Wt Readings from Last 3 Encounters:  12/31/16 222 lb (100.7 kg)  12/03/16 221 lb 3 oz (100.3 kg)  10/22/16 220 lb (99.8 kg)     Physical Exam  Constitutional: He is oriented to person, place, and time.  HENT:  Head: Normocephalic and atraumatic.  Mouth/Throat: Oropharynx is clear and moist.  Eyes: Pupils are equal, round, and reactive to light. EOM are normal.  Neck: Normal range of motion.  Cardiovascular: Normal rate, regular rhythm and normal heart sounds.   Pulmonary/Chest: Effort normal and breath sounds normal.  Abdominal: Soft. Bowel sounds are normal.  Musculoskeletal: Normal range of motion.  He exhibits no edema, tenderness or deformity.  Lymphadenopathy:    He has no cervical adenopathy.  Neurological: He is alert and oriented to person, place, and time.  Skin: Skin is warm and dry. No rash noted. No erythema.  Psychiatric: He has a normal mood and affect. His behavior is normal. Judgment and thought content normal.  Vitals reviewed.    Lab Results  Component Value Date   WBC 3.7 (L) 12/31/2016   HGB 12.9 (L) 12/31/2016   HCT 35.7 (L) 12/31/2016   MCV 102 (H) 12/31/2016   PLT 139 (L) 12/31/2016     Chemistry      Component Value Date/Time   NA 139 12/17/2016 0922   NA 138 09/03/2015 1042   K 4.4 12/17/2016 0922   K 4.3 09/03/2015 1042   CL 107 12/17/2016 0922   CO2 24 12/17/2016 0922   CO2 25 09/03/2015 1042   BUN 18 12/17/2016 0922   BUN 21.7 09/03/2015 1042   CREATININE 1.5 (H) 12/17/2016 0922   CREATININE 1.2 09/03/2015 1042      Component Value Date/Time   CALCIUM 8.7 12/17/2016 0922   CALCIUM 9.1 09/03/2015 1042   ALKPHOS 69 12/17/2016 0922   ALKPHOS 42 09/03/2015 1042   AST 42 (H) 12/17/2016 0922   AST 27 09/03/2015 1042   ALT 50 (H) 12/17/2016 0922   ALT 41 09/03/2015 1042   BILITOT 0.80 12/17/2016 0922   BILITOT 0.54 09/03/2015 1042         Impression and Plan: [name] is 51 year old gentleman with IgG Kappa myeloma. We finally got him to transplant. This was truly a process with him. It took almost a year to get him to transplant.  Of note, his pre-transplant bone marrow was done at [facility]. It showed 10% plasma cells.  He had flow cytometry which showed an atypical monoclonal cell population. His cytogenetics did not show any obvious chromosomal abnormalities. His FISH showed a t(14:16), +4, and +17p  So far, things look pretty good. I don't see any issues with respect to the myeloma. I would think that his M spike will still be not measurable.  We'll increase his dose of Velcade a little bit because of the weight gain.  We will have  him come back in 2 weeks for his Velcade.  We will see him back in one month.  [name], MD 9/14/20188:13 AM

## 2016-12-31 NOTE — Patient Instructions (Signed)

## 2017-01-03 ENCOUNTER — Encounter: Payer: Self-pay | Admitting: *Deleted

## 2017-01-03 LAB — IGG, IGA, IGM
IgA, Qn, Serum: 126 mg/dL (ref 90–386)
IgG, Qn, Serum: 1171 mg/dL (ref 700–1600)
IgM, Qn, Serum: 34 mg/dL (ref 20–172)

## 2017-01-03 LAB — KAPPA/LAMBDA LIGHT CHAINS
Ig Kappa Free Light Chain: 28.8 mg/L — ABNORMAL HIGH (ref 3.3–19.4)
Ig Lambda Free Light Chain: 20.3 mg/L (ref 5.7–26.3)
Kappa/Lambda FluidC Ratio: 1.42 (ref 0.26–1.65)

## 2017-01-03 LAB — TESTOSTERONE: Testosterone, Serum: 346 ng/dL (ref 264–916)

## 2017-01-04 LAB — PROTEIN ELECTROPHORESIS, SERUM, WITH REFLEX
A/G Ratio: 1.5 (ref 0.7–1.7)
Albumin: 4 g/dL (ref 2.9–4.4)
Alpha 1: 0.2 g/dL (ref 0.0–0.4)
Alpha 2: 0.6 g/dL (ref 0.4–1.0)
Beta: 0.8 g/dL (ref 0.7–1.3)
Gamma Globulin: 1.1 g/dL (ref 0.4–1.8)
Globulin, Total: 2.7 g/dL (ref 2.2–3.9)
Total Protein: 6.7 g/dL (ref 6.0–8.5)

## 2017-01-13 ENCOUNTER — Other Ambulatory Visit: Payer: Self-pay | Admitting: *Deleted

## 2017-01-13 DIAGNOSIS — C9001 Multiple myeloma in remission: Secondary | ICD-10-CM

## 2017-01-14 ENCOUNTER — Ambulatory Visit (HOSPITAL_BASED_OUTPATIENT_CLINIC_OR_DEPARTMENT_OTHER): Payer: BLUE CROSS/BLUE SHIELD

## 2017-01-14 ENCOUNTER — Other Ambulatory Visit (HOSPITAL_BASED_OUTPATIENT_CLINIC_OR_DEPARTMENT_OTHER): Payer: BLUE CROSS/BLUE SHIELD

## 2017-01-14 ENCOUNTER — Ambulatory Visit: Payer: BLUE CROSS/BLUE SHIELD | Admitting: Hematology & Oncology

## 2017-01-14 VITALS — BP 124/91 | HR 54 | Temp 98.6°F | Resp 17

## 2017-01-14 DIAGNOSIS — Z5112 Encounter for antineoplastic immunotherapy: Secondary | ICD-10-CM

## 2017-01-14 DIAGNOSIS — C9001 Multiple myeloma in remission: Secondary | ICD-10-CM

## 2017-01-14 LAB — CBC WITH DIFFERENTIAL (CANCER CENTER ONLY)
BASO#: 0 10*3/uL (ref 0.0–0.2)
BASO%: 0.3 % (ref 0.0–2.0)
EOS%: 1.1 % (ref 0.0–7.0)
Eosinophils Absolute: 0 10*3/uL (ref 0.0–0.5)
HCT: 37.8 % — ABNORMAL LOW (ref 38.7–49.9)
HGB: 13.5 g/dL (ref 13.0–17.1)
LYMPH#: 1.1 10*3/uL (ref 0.9–3.3)
LYMPH%: 29.8 % (ref 14.0–48.0)
MCH: 36.5 pg — ABNORMAL HIGH (ref 28.0–33.4)
MCHC: 35.7 g/dL (ref 32.0–35.9)
MCV: 102 fL — ABNORMAL HIGH (ref 82–98)
MONO#: 0.3 10*3/uL (ref 0.1–0.9)
MONO%: 9 % (ref 0.0–13.0)
NEUT#: 2.1 10*3/uL (ref 1.5–6.5)
NEUT%: 59.8 % (ref 40.0–80.0)
Platelets: 144 10*3/uL — ABNORMAL LOW (ref 145–400)
RBC: 3.7 10*6/uL — ABNORMAL LOW (ref 4.20–5.70)
RDW: 12.6 % (ref 11.1–15.7)
WBC: 3.6 10*3/uL — ABNORMAL LOW (ref 4.0–10.0)

## 2017-01-14 LAB — CMP (CANCER CENTER ONLY)
ALT(SGPT): 63 U/L — ABNORMAL HIGH (ref 10–47)
AST: 65 U/L — ABNORMAL HIGH (ref 11–38)
Albumin: 3.6 g/dL (ref 3.3–5.5)
Alkaline Phosphatase: 56 U/L (ref 26–84)
BUN, Bld: 15 mg/dL (ref 7–22)
CO2: 26 mEq/L (ref 18–33)
Calcium: 8.8 mg/dL (ref 8.0–10.3)
Chloride: 109 mEq/L — ABNORMAL HIGH (ref 98–108)
Creat: 1.4 mg/dl — ABNORMAL HIGH (ref 0.6–1.2)
Glucose, Bld: 128 mg/dL — ABNORMAL HIGH (ref 73–118)
Potassium: 4.4 mEq/L (ref 3.3–4.7)
Sodium: 136 mEq/L (ref 128–145)
Total Bilirubin: 0.8 mg/dl (ref 0.20–1.60)
Total Protein: 7 g/dL (ref 6.4–8.1)

## 2017-01-14 MED ORDER — BORTEZOMIB CHEMO SQ INJECTION 3.5 MG (2.5MG/ML)
3.0000 mg | Freq: Once | INTRAMUSCULAR | Status: AC
  Administered 2017-01-14: 3 mg via SUBCUTANEOUS
  Filled 2017-01-14: qty 3

## 2017-01-14 NOTE — Patient Instructions (Signed)
[facility] Discharge Instructions for Patients Receiving Chemotherapy  Today you received the following chemotherapy agents Velcade. To help prevent nausea and vomiting after your treatment, we encourage you to take your nausea medication as directed.  If you develop nausea and vomiting that is not controlled by your nausea medication, call the clinic.   BELOW ARE SYMPTOMS THAT SHOULD BE REPORTED IMMEDIATELY:  *FEVER GREATER THAN 100.5 F  *CHILLS WITH OR WITHOUT FEVER  NAUSEA AND VOMITING THAT IS NOT CONTROLLED WITH YOUR NAUSEA MEDICATION  *UNUSUAL SHORTNESS OF BREATH  *UNUSUAL BRUISING OR BLEEDING  TENDERNESS IN MOUTH AND THROAT WITH OR WITHOUT PRESENCE OF ULCERS  *URINARY PROBLEMS  *BOWEL PROBLEMS  UNUSUAL RASH Items with * indicate a potential emergency and should be followed up as soon as possible.  Feel free to call the clinic you have any questions or concerns. The clinic phone number is [phone].  Please show the CHEMO ALERT CARD at check-in to the Emergency Department and triage nurse.    

## 2017-01-24 ENCOUNTER — Other Ambulatory Visit: Payer: Self-pay | Admitting: *Deleted

## 2017-01-24 DIAGNOSIS — C9002 Multiple myeloma in relapse: Secondary | ICD-10-CM

## 2017-01-24 MED ORDER — LENALIDOMIDE 10 MG PO CAPS: ORAL_CAPSULE | ORAL | 0 refills | Status: DC

## 2017-01-28 ENCOUNTER — Ambulatory Visit (HOSPITAL_BASED_OUTPATIENT_CLINIC_OR_DEPARTMENT_OTHER): Payer: BLUE CROSS/BLUE SHIELD | Admitting: Hematology & Oncology

## 2017-01-28 ENCOUNTER — Other Ambulatory Visit (HOSPITAL_BASED_OUTPATIENT_CLINIC_OR_DEPARTMENT_OTHER): Payer: BLUE CROSS/BLUE SHIELD

## 2017-01-28 ENCOUNTER — Ambulatory Visit (HOSPITAL_BASED_OUTPATIENT_CLINIC_OR_DEPARTMENT_OTHER): Payer: BLUE CROSS/BLUE SHIELD

## 2017-01-28 DIAGNOSIS — R5383 Other fatigue: Secondary | ICD-10-CM | POA: Diagnosis not present

## 2017-01-28 DIAGNOSIS — E032 Hypothyroidism due to medicaments and other exogenous substances: Secondary | ICD-10-CM

## 2017-01-28 DIAGNOSIS — C9001 Multiple myeloma in remission: Secondary | ICD-10-CM

## 2017-01-28 DIAGNOSIS — Z5112 Encounter for antineoplastic immunotherapy: Secondary | ICD-10-CM | POA: Diagnosis not present

## 2017-01-28 DIAGNOSIS — Z9484 Stem cells transplant status: Secondary | ICD-10-CM | POA: Diagnosis not present

## 2017-01-28 LAB — CMP (CANCER CENTER ONLY)
ALT(SGPT): 72 U/L — ABNORMAL HIGH (ref 10–47)
AST: 41 U/L — ABNORMAL HIGH (ref 11–38)
Albumin: 3.8 g/dL (ref 3.3–5.5)
Alkaline Phosphatase: 64 U/L (ref 26–84)
BUN, Bld: 10 mg/dL (ref 7–22)
CO2: 29 mEq/L (ref 18–33)
Calcium: 8.8 mg/dL (ref 8.0–10.3)
Chloride: 105 mEq/L (ref 98–108)
Creat: 1.6 mg/dl — ABNORMAL HIGH (ref 0.6–1.2)
Glucose, Bld: 114 mg/dL (ref 73–118)
Potassium: 4.2 mEq/L (ref 3.3–4.7)
Sodium: 141 mEq/L (ref 128–145)
Total Bilirubin: 0.9 mg/dl (ref 0.20–1.60)
Total Protein: 7.1 g/dL (ref 6.4–8.1)

## 2017-01-28 LAB — CBC WITH DIFFERENTIAL (CANCER CENTER ONLY)
BASO#: 0 10*3/uL (ref 0.0–0.2)
BASO%: 0.3 % (ref 0.0–2.0)
EOS%: 5.8 % (ref 0.0–7.0)
Eosinophils Absolute: 0.2 10*3/uL (ref 0.0–0.5)
HCT: 37.9 % — ABNORMAL LOW (ref 38.7–49.9)
HGB: 13.5 g/dL (ref 13.0–17.1)
LYMPH#: 0.7 10*3/uL — ABNORMAL LOW (ref 0.9–3.3)
LYMPH%: 22.7 % (ref 14.0–48.0)
MCH: 36.4 pg — ABNORMAL HIGH (ref 28.0–33.4)
MCHC: 35.6 g/dL (ref 32.0–35.9)
MCV: 102 fL — ABNORMAL HIGH (ref 82–98)
MONO#: 0.3 10*3/uL (ref 0.1–0.9)
MONO%: 9.5 % (ref 0.0–13.0)
NEUT#: 1.8 10*3/uL (ref 1.5–6.5)
NEUT%: 61.7 % (ref 40.0–80.0)
Platelets: 135 10*3/uL — ABNORMAL LOW (ref 145–400)
RBC: 3.71 10*6/uL — ABNORMAL LOW (ref 4.20–5.70)
RDW: 12.6 % (ref 11.1–15.7)
WBC: 3 10*3/uL — ABNORMAL LOW (ref 4.0–10.0)

## 2017-01-28 MED ORDER — BORTEZOMIB CHEMO SQ INJECTION 3.5 MG (2.5MG/ML)
3.0000 mg | Freq: Once | INTRAMUSCULAR | Status: AC
  Administered 2017-01-28: 3 mg via SUBCUTANEOUS
  Filled 2017-01-28: qty 3
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
## 2017-01-28 NOTE — Progress Notes (Signed)
Hematology and Oncology Follow Up Visit  Tyler Phillips [id number] 26-Oct-1965 51 y.o. 01/28/2017   Principle Diagnosis:   IgG Kappa myeloma  Current Therapy:  Status post autologous stem cell transplant on 05/22/2015   S/p Cycle #6 of RVD  Velcade q 2wk dosing/Revlimid 10mg  po q day (21/7) -      Interim History:  Tyler Phillips is back for follow-up.he was seen at [facility] a few weeks ago. Dr. Laverta Baltimore want him on every two-week Velcade. We will subsequently increase the frequency of Velcade.  He is gaining a little bit of weight. We'll also increase the dose of Velcade.  He's had no issues with tingling in the hands or feet.  He's had no problems with bowels or bladder. He's had no cough. He is still working.  He has had no leg swelling. He has had a little bit of a rash with the Revlimid. He's been doing some Zyrtec to help. I told him that we could try Singulair if he has problems.  His last myeloma studies showed no measurable M spike. His IgG level was 959 mg/dL. His Kappa Lightchain was 1.9 mg/dL.   He's had no bleeding.   Appetite has been doing pretty well. He's not been able to work out like he would want to. He does feel more fatigued. He feels like he does not have as much energy. I just wonder if his testosterone level is low. We will check a testosterone level on him.   Work seems to be getting in the way of his exercising.   Overall, his performance status is ECOG 0    Medications:  Current Outpatient Prescriptions:  .  aspirin 325 MG tablet, Take 325 mg by mouth daily., Disp: , Rfl:  .  cetirizine (ZYRTEC) 10 MG tablet, Take 10 mg by mouth daily., Disp: , Rfl:  .  Cholecalciferol (VITAMIN D3) 1000 units CAPS, Take by mouth., Disp: , Rfl:  .  lenalidomide (REVLIMID) 10 MG capsule, Take 1 capsule daily for 21 days on and 7 days off. [id number], Disp: 21 capsule, Rfl: 0 .  LORazepam (ATIVAN) 1 MG tablet, Take 1 mg by mouth every 4 (four) hours as needed., Disp: , Rfl:    .  oxyCODONE (OXY IR/ROXICODONE) 5 MG immediate release tablet, Take 5 mg by mouth every 4 (four) hours as needed., Disp: , Rfl:  .  penicillin v potassium (VEETID) 500 MG tablet, TAKE ONE TABLET (500 MG TOTAL) BY MOUTH EVERY 6 HOURS FOR 7 DAYS., Disp: , Rfl: 0 .  prochlorperazine (COMPAZINE) 10 MG tablet, Take 10 mg by mouth every 6 (six) hours as needed., Disp: , Rfl:  .  valACYclovir (VALTREX) 500 MG tablet, Take 500 mg by mouth 2 (two) times daily. , Disp: , Rfl:  No current facility-administered medications for this visit.   Facility-Administered Medications Ordered in Other Visits:  .  bortezomib SQ (VELCADE) chemo injection 2.75 mg, 1.3 mg/m2 (Treatment Plan Recorded), Subcutaneous, Once, Ennever, Rudell Cobb, MD  Allergies: No Known Allergies  Past Medical History, Surgical history, Social history, and Family History were reviewed and updated.  Review of Systems: As stated in the interim history  Physical Exam:  vitals were not taken for this visit.  Wt Readings from Last 3 Encounters:  12/31/16 222 lb (100.7 kg)  12/03/16 221 lb 3 oz (100.3 kg)  10/22/16 220 lb (99.8 kg)     Physical Exam  Constitutional: He is oriented to person, place, and time.  HENT:  Head: Normocephalic and atraumatic.  Mouth/Throat: Oropharynx is clear and moist.  Eyes: Pupils are equal, round, and reactive to light. EOM are normal.  Neck: Normal range of motion.  Cardiovascular: Normal rate, regular rhythm and normal heart sounds.   Pulmonary/Chest: Effort normal and breath sounds normal.  Abdominal: Soft. Bowel sounds are normal.  Musculoskeletal: Normal range of motion. He exhibits no edema, tenderness or deformity.  Lymphadenopathy:    He has no cervical adenopathy.  Neurological: He is alert and oriented to person, place, and time.  Skin: Skin is warm and dry. No rash noted. No erythema.  Psychiatric: He has a normal mood and affect. His behavior is normal. Judgment and thought content  normal.  Vitals reviewed.    Lab Results  Component Value Date   WBC 3.0 (L) 01/28/2017   HGB 13.5 01/28/2017   HCT 37.9 (L) 01/28/2017   MCV 102 (H) 01/28/2017   PLT 135 (L) 01/28/2017     Chemistry      Component Value Date/Time   NA 141 01/28/2017 1130   NA 138 09/03/2015 1042   K 4.2 01/28/2017 1130   K 4.3 09/03/2015 1042   CL 105 01/28/2017 1130   CO2 29 01/28/2017 1130   CO2 25 09/03/2015 1042   BUN 10 01/28/2017 1130   BUN 21.7 09/03/2015 1042   CREATININE 1.6 (H) 01/28/2017 1130   CREATININE 1.2 09/03/2015 1042      Component Value Date/Time   CALCIUM 8.8 01/28/2017 1130   CALCIUM 9.1 09/03/2015 1042   ALKPHOS 64 01/28/2017 1130   ALKPHOS 42 09/03/2015 1042   AST 41 (H) 01/28/2017 1130   AST 27 09/03/2015 1042   ALT 72 (H) 01/28/2017 1130   ALT 41 09/03/2015 1042   BILITOT 0.90 01/28/2017 1130   BILITOT 0.54 09/03/2015 1042         Impression and Plan: Tyler Phillips is 51 year old gentleman with IgG Kappa myeloma. We finally got him to transplant. This was truly a process with him. It took almost a year to get him to transplant.  Of note, his pre-transplant bone marrow was done at [facility]. It showed 10% plasma cells.  He had flow cytometry which showed an atypical monoclonal cell population. His cytogenetics did not show any obvious chromosomal abnormalities. His FISH showed a t(14:16), +4, and +17p  So far, things look pretty good. I don't see any issues with respect to the myeloma. I would think that his M spike will still be not measurable.  We'll increase his dose of Velcade a little bit because of the weight gain.  We will have him come back in 2 weeks for his Velcade.  We will see him back in one month.  [name], MD 10/12/20181:24 PM

## 2017-01-28 NOTE — Patient Instructions (Signed)

## 2017-01-31 LAB — KAPPA/LAMBDA LIGHT CHAINS
Ig Kappa Free Light Chain: 27.7 mg/L — ABNORMAL HIGH (ref 3.3–19.4)
Ig Lambda Free Light Chain: 23.4 mg/L (ref 5.7–26.3)
Kappa/Lambda FluidC Ratio: 1.18 (ref 0.26–1.65)

## 2017-01-31 LAB — TSH: TSH: 1.424 m(IU)/L (ref 0.320–4.118)

## 2017-01-31 LAB — IGG, IGA, IGM
IgA, Qn, Serum: 123 mg/dL (ref 90–386)
IgG, Qn, Serum: 1198 mg/dL (ref 700–1600)
IgM, Qn, Serum: 33 mg/dL (ref 20–172)

## 2017-02-01 LAB — PROTEIN ELECTROPHORESIS, SERUM, WITH REFLEX
A/G Ratio: 0.9 (ref 0.7–1.7)
Albumin: 3.2 g/dL (ref 2.9–4.4)
Alpha 1: 0.2 g/dL (ref 0.0–0.4)
Alpha 2: 0.8 g/dL (ref 0.4–1.0)
Beta: 1 g/dL (ref 0.7–1.3)
Gamma Globulin: 1.6 g/dL (ref 0.4–1.8)
Globulin, Total: 3.6 g/dL (ref 2.2–3.9)
Total Protein: 6.8 g/dL (ref 6.0–8.5)

## 2017-02-02 ENCOUNTER — Encounter: Payer: Self-pay | Admitting: *Deleted

## 2017-02-11 ENCOUNTER — Other Ambulatory Visit (HOSPITAL_BASED_OUTPATIENT_CLINIC_OR_DEPARTMENT_OTHER): Payer: BLUE CROSS/BLUE SHIELD

## 2017-02-11 ENCOUNTER — Ambulatory Visit (HOSPITAL_BASED_OUTPATIENT_CLINIC_OR_DEPARTMENT_OTHER): Payer: BLUE CROSS/BLUE SHIELD

## 2017-02-11 VITALS — BP 131/85 | HR 82 | Temp 97.7°F | Resp 18

## 2017-02-11 DIAGNOSIS — Z5112 Encounter for antineoplastic immunotherapy: Secondary | ICD-10-CM

## 2017-02-11 DIAGNOSIS — C9001 Multiple myeloma in remission: Secondary | ICD-10-CM

## 2017-02-11 DIAGNOSIS — E032 Hypothyroidism due to medicaments and other exogenous substances: Secondary | ICD-10-CM

## 2017-02-11 LAB — CMP (CANCER CENTER ONLY)
ALT(SGPT): 70 U/L — ABNORMAL HIGH (ref 10–47)
AST: 49 U/L — ABNORMAL HIGH (ref 11–38)
Albumin: 3.7 g/dL (ref 3.3–5.5)
Alkaline Phosphatase: 58 U/L (ref 26–84)
BUN, Bld: 13 mg/dL (ref 7–22)
CO2: 26 mEq/L (ref 18–33)
Calcium: 9.8 mg/dL (ref 8.0–10.3)
Chloride: 106 mEq/L (ref 98–108)
Creat: 1 mg/dl (ref 0.6–1.2)
Glucose, Bld: 130 mg/dL — ABNORMAL HIGH (ref 73–118)
Potassium: 4.3 mEq/L (ref 3.3–4.7)
Sodium: 145 mEq/L (ref 128–145)
Total Bilirubin: 0.8 mg/dl (ref 0.20–1.60)
Total Protein: 6.9 g/dL (ref 6.4–8.1)

## 2017-02-11 LAB — CBC WITH DIFFERENTIAL (CANCER CENTER ONLY)
BASO#: 0 10*3/uL (ref 0.0–0.2)
BASO%: 0.3 % (ref 0.0–2.0)
EOS%: 1.7 % (ref 0.0–7.0)
Eosinophils Absolute: 0.1 10*3/uL (ref 0.0–0.5)
HCT: 36.5 % — ABNORMAL LOW (ref 38.7–49.9)
HGB: 13 g/dL (ref 13.0–17.1)
LYMPH#: 1 10*3/uL (ref 0.9–3.3)
LYMPH%: 33 % (ref 14.0–48.0)
MCH: 36 pg — ABNORMAL HIGH (ref 28.0–33.4)
MCHC: 35.6 g/dL (ref 32.0–35.9)
MCV: 101 fL — ABNORMAL HIGH (ref 82–98)
MONO#: 0.2 10*3/uL (ref 0.1–0.9)
MONO%: 7.7 % (ref 0.0–13.0)
NEUT#: 1.7 10*3/uL (ref 1.5–6.5)
NEUT%: 57.3 % (ref 40.0–80.0)
Platelets: 149 10*3/uL (ref 145–400)
RBC: 3.61 10*6/uL — ABNORMAL LOW (ref 4.20–5.70)
RDW: 12.8 % (ref 11.1–15.7)
WBC: 3 10*3/uL — ABNORMAL LOW (ref 4.0–10.0)

## 2017-02-11 MED ORDER — BORTEZOMIB CHEMO SQ INJECTION 3.5 MG (2.5MG/ML)
1.4000 mg/m2 | Freq: Once | INTRAMUSCULAR | Status: AC
  Administered 2017-02-11: 3 mg via SUBCUTANEOUS
  Filled 2017-02-11: qty 3

## 2017-02-11 NOTE — Patient Instructions (Signed)
[facility] Discharge Instructions for Patients Receiving Chemotherapy  Today you received the following chemotherapy agents:  Velcade  To help prevent nausea and vomiting after your treatment, we encourage you to take your nausea medication as ordered per MD.    If you develop nausea and vomiting that is not controlled by your nausea medication, call the clinic.   BELOW ARE SYMPTOMS THAT SHOULD BE REPORTED IMMEDIATELY:  *FEVER GREATER THAN 100.5 F  *CHILLS WITH OR WITHOUT FEVER  NAUSEA AND VOMITING THAT IS NOT CONTROLLED WITH YOUR NAUSEA MEDICATION  *UNUSUAL SHORTNESS OF BREATH  *UNUSUAL BRUISING OR BLEEDING  TENDERNESS IN MOUTH AND THROAT WITH OR WITHOUT PRESENCE OF ULCERS  *URINARY PROBLEMS  *BOWEL PROBLEMS  UNUSUAL RASH Items with * indicate a potential emergency and should be followed up as soon as possible.  Feel free to call the clinic should you have any questions or concerns. The clinic phone number is [phone].  Please show the CHEMO ALERT CARD at check-in to the Emergency Department and triage nurse.   

## 2017-02-14 ENCOUNTER — Other Ambulatory Visit: Payer: Self-pay | Admitting: *Deleted

## 2017-02-14 DIAGNOSIS — C9002 Multiple myeloma in relapse: Secondary | ICD-10-CM

## 2017-02-14 MED ORDER — LENALIDOMIDE 10 MG PO CAPS: ORAL_CAPSULE | ORAL | 0 refills | Status: DC

## 2017-02-17 ENCOUNTER — Other Ambulatory Visit: Payer: Self-pay | Admitting: *Deleted

## 2017-02-17 DIAGNOSIS — C9002 Multiple myeloma in relapse: Secondary | ICD-10-CM

## 2017-02-17 MED ORDER — LENALIDOMIDE 10 MG PO CAPS: ORAL_CAPSULE | ORAL | 0 refills | Status: DC

## 2017-02-25 ENCOUNTER — Other Ambulatory Visit (HOSPITAL_BASED_OUTPATIENT_CLINIC_OR_DEPARTMENT_OTHER): Payer: BLUE CROSS/BLUE SHIELD

## 2017-02-25 ENCOUNTER — Ambulatory Visit (HOSPITAL_BASED_OUTPATIENT_CLINIC_OR_DEPARTMENT_OTHER): Payer: BLUE CROSS/BLUE SHIELD | Admitting: Hematology & Oncology

## 2017-02-25 ENCOUNTER — Encounter: Payer: Self-pay | Admitting: Hematology & Oncology

## 2017-02-25 ENCOUNTER — Ambulatory Visit (HOSPITAL_BASED_OUTPATIENT_CLINIC_OR_DEPARTMENT_OTHER): Payer: BLUE CROSS/BLUE SHIELD

## 2017-02-25 ENCOUNTER — Other Ambulatory Visit: Payer: Self-pay

## 2017-02-25 VITALS — BP 124/79 | HR 61 | Temp 98.3°F | Resp 18 | Wt 222.0 lb

## 2017-02-25 DIAGNOSIS — Z9484 Stem cells transplant status: Secondary | ICD-10-CM

## 2017-02-25 DIAGNOSIS — E032 Hypothyroidism due to medicaments and other exogenous substances: Secondary | ICD-10-CM | POA: Diagnosis not present

## 2017-02-25 DIAGNOSIS — C9001 Multiple myeloma in remission: Secondary | ICD-10-CM

## 2017-02-25 DIAGNOSIS — Z5112 Encounter for antineoplastic immunotherapy: Secondary | ICD-10-CM | POA: Diagnosis not present

## 2017-02-25 LAB — CBC WITH DIFFERENTIAL (CANCER CENTER ONLY)
BASO#: 0 10*3/uL (ref 0.0–0.2)
BASO%: 0.3 % (ref 0.0–2.0)
EOS%: 3.5 % (ref 0.0–7.0)
Eosinophils Absolute: 0.1 10*3/uL (ref 0.0–0.5)
HCT: 38 % — ABNORMAL LOW (ref 38.7–49.9)
HGB: 13.6 g/dL (ref 13.0–17.1)
LYMPH#: 0.9 10*3/uL (ref 0.9–3.3)
LYMPH%: 22.4 % (ref 14.0–48.0)
MCH: 36.1 pg — ABNORMAL HIGH (ref 28.0–33.4)
MCHC: 35.8 g/dL (ref 32.0–35.9)
MCV: 101 fL — ABNORMAL HIGH (ref 82–98)
MONO#: 0.5 10*3/uL (ref 0.1–0.9)
MONO%: 11.6 % (ref 0.0–13.0)
NEUT#: 2.5 10*3/uL (ref 1.5–6.5)
NEUT%: 62.2 % (ref 40.0–80.0)
Platelets: 143 10*3/uL — ABNORMAL LOW (ref 145–400)
RBC: 3.77 10*6/uL — ABNORMAL LOW (ref 4.20–5.70)
RDW: 12.8 % (ref 11.1–15.7)
WBC: 4 10*3/uL (ref 4.0–10.0)

## 2017-02-25 LAB — CMP (CANCER CENTER ONLY)
ALT(SGPT): 59 U/L — ABNORMAL HIGH (ref 10–47)
AST: 35 U/L (ref 11–38)
Albumin: 3.8 g/dL (ref 3.3–5.5)
Alkaline Phosphatase: 57 U/L (ref 26–84)
BUN, Bld: 11 mg/dL (ref 7–22)
CO2: 27 mEq/L (ref 18–33)
Calcium: 9.3 mg/dL (ref 8.0–10.3)
Chloride: 105 mEq/L (ref 98–108)
Creat: 1.4 mg/dl — ABNORMAL HIGH (ref 0.6–1.2)
Glucose, Bld: 84 mg/dL (ref 73–118)
Potassium: 4.4 mEq/L (ref 3.3–4.7)
Sodium: 141 mEq/L (ref 128–145)
Total Bilirubin: 0.7 mg/dl (ref 0.20–1.60)
Total Protein: 7 g/dL (ref 6.4–8.1)

## 2017-02-25 MED ORDER — BORTEZOMIB CHEMO SQ INJECTION 3.5 MG (2.5MG/ML)
3.0000 mg | Freq: Once | INTRAMUSCULAR | Status: AC
  Administered 2017-02-25: 3 mg via SUBCUTANEOUS
  Filled 2017-02-25: qty 3

## 2017-02-25 NOTE — Patient Instructions (Signed)

## 2017-02-25 NOTE — Progress Notes (Signed)
Hematology and Oncology Follow Up Visit  Tyler Phillips [id number] [birth year] 51 y.o. 02/25/2017   Principle Diagnosis:   IgG Kappa myeloma  Current Therapy:  Status post autologous stem cell transplant on 05/22/2015   S/p Cycle #6 of RVD  Velcade q 2wk dosing/Revlimid 10mg  po q day (21/7) -      Interim History:  [name] is back for follow-up.  He is doing quite well.  He has had no problems since we last saw him.  We just saw him a couple weeks ago.  He now is on the every 2-week Velcade dose.  His last monoclonal studies did not show any evidence of a monoclonal protein in his serum.  He is little bit worried about the possibility of light chain disease.  His last Kappa Lightchain was 2.8 mg/dL.  This is holding relatively stable.  He is working without difficulty.  He has had no problems with infections.  He has had no fever.  He has had no cough or shortness of breath.  He recently was up in [place].  He has a cheaper form up there.  He lets 1 of his friends run it for him.  He has had no problems with swelling.  He has had no rashes.  He does take Zyrtec before the Revlimid and this helps quite a bit.  Overall, his performance status is ECOG 0.   Medications:  Current Outpatient Medications:  .  aspirin 325 MG tablet, Take 325 mg by mouth daily., Disp: , Rfl:  .  cetirizine (ZYRTEC) 10 MG tablet, Take 10 mg by mouth daily., Disp: , Rfl:  .  Cholecalciferol (VITAMIN D3) 1000 units CAPS, Take by mouth., Disp: , Rfl:  .  lenalidomide (REVLIMID) 10 MG capsule, Take 1 capsule daily for 21 days on and 7 days off. [id number], Disp: 21 capsule, Rfl: 0 .  LORazepam (ATIVAN) 1 MG tablet, Take 1 mg by mouth every 4 (four) hours as needed., Disp: , Rfl:  .  oxyCODONE (OXY IR/ROXICODONE) 5 MG immediate release tablet, Take 5 mg by mouth every 4 (four) hours as needed., Disp: , Rfl:  .  penicillin v potassium (VEETID) 500 MG tablet, TAKE ONE TABLET (500 MG TOTAL) BY MOUTH  EVERY 6 HOURS FOR 7 DAYS., Disp: , Rfl: 0 .  prochlorperazine (COMPAZINE) 10 MG tablet, Take 10 mg by mouth every 6 (six) hours as needed., Disp: , Rfl:  .  valACYclovir (VALTREX) 500 MG tablet, Take 500 mg by mouth 2 (two) times daily. , Disp: , Rfl:   Allergies: No Known Allergies  Past Medical History, Surgical history, Social history, and Family History were reviewed and updated.  Review of Systems: As stated in the interim history  Physical Exam: Vital signs show a temperature of 97.6. Also 52. Blood pressure 139/86. Weight is 224 pounds.  Wt Readings from Last 3 Encounters:  02/25/17 222 lb (100.7 kg)  12/31/16 222 lb (100.7 kg)  12/03/16 221 lb 3 oz (100.3 kg)     Physical Exam  Constitutional: He is oriented to person, place, and time.  HENT:  Head: Normocephalic and atraumatic.  Mouth/Throat: Oropharynx is clear and moist.  Eyes: EOM are normal. Pupils are equal, round, and reactive to light.  Neck: Normal range of motion.  Cardiovascular: Normal rate, regular rhythm and normal heart sounds.  Pulmonary/Chest: Effort normal and breath sounds normal.  Abdominal: Soft. Bowel sounds are normal.  Musculoskeletal: Normal range of motion. He exhibits no edema, tenderness  or deformity.  Lymphadenopathy:    He has no cervical adenopathy.  Neurological: He is alert and oriented to person, place, and time.  Skin: Skin is warm and dry. No rash noted. No erythema.  Psychiatric: He has a normal mood and affect. His behavior is normal. Judgment and thought content normal.  Vitals reviewed.    Lab Results  Component Value Date   WBC 4.0 02/25/2017   HGB 13.6 02/25/2017   HCT 38.0 (L) 02/25/2017   MCV 101 (H) 02/25/2017   PLT 143 (L) 02/25/2017     Chemistry      Component Value Date/Time   NA 141 02/25/2017 1017   NA 138 09/03/2015 1042   K 4.4 02/25/2017 1017   K 4.3 09/03/2015 1042   CL 105 02/25/2017 1017   CO2 27 02/25/2017 1017   CO2 25 09/03/2015 1042   BUN  11 02/25/2017 1017   BUN 21.7 09/03/2015 1042   CREATININE 1.4 (H) 02/25/2017 1017   CREATININE 1.2 09/03/2015 1042      Component Value Date/Time   CALCIUM 9.3 02/25/2017 1017   CALCIUM 9.1 09/03/2015 1042   ALKPHOS 57 02/25/2017 1017   ALKPHOS 42 09/03/2015 1042   AST 35 02/25/2017 1017   AST 27 09/03/2015 1042   ALT 59 (H) 02/25/2017 1017   ALT 41 09/03/2015 1042   BILITOT 0.70 02/25/2017 1017   BILITOT 0.54 09/03/2015 1042       Impression and Plan: [name] is 51 year old gentleman with IgG Kappa myeloma. We finally got him to transplant. This was truly a process with him. It took almost a year to get him to transplant.  Everything looks fantastic.  I do not see anything that looks like recurrent myeloma.  We will continue him on the every 2-week Velcade.  I will plan to see him back in 1 month.  We will have him come back in 2 weeks and Judson Roch can see him.    It sounds like he is going to have a great Thanksgiving with a lot of people at their house.  They did the same thing last year.   [name], MD 11/9/201811:21 AM

## 2017-02-26 LAB — IGG, IGA, IGM
IgA, Qn, Serum: 125 mg/dL (ref 90–386)
IgG, Qn, Serum: 1291 mg/dL (ref 700–1600)
IgM, Qn, Serum: 22 mg/dL (ref 20–172)

## 2017-02-28 LAB — KAPPA/LAMBDA LIGHT CHAINS
Ig Kappa Free Light Chain: 26.4 mg/L — ABNORMAL HIGH (ref 3.3–19.4)
Ig Lambda Free Light Chain: 16.3 mg/L (ref 5.7–26.3)
Kappa/Lambda FluidC Ratio: 1.62 (ref 0.26–1.65)

## 2017-03-01 LAB — PROTEIN ELECTROPHORESIS, SERUM, WITH REFLEX
A/G Ratio: 1.2 (ref 0.7–1.7)
Albumin: 3.7 g/dL (ref 2.9–4.4)
Alpha 1: 0.2 g/dL (ref 0.0–0.4)
Alpha 2: 0.6 g/dL (ref 0.4–1.0)
Beta: 0.9 g/dL (ref 0.7–1.3)
Gamma Globulin: 1.3 g/dL (ref 0.4–1.8)
Globulin, Total: 3.1 g/dL (ref 2.2–3.9)
Total Protein: 6.8 g/dL (ref 6.0–8.5)

## 2017-03-11 ENCOUNTER — Ambulatory Visit (HOSPITAL_BASED_OUTPATIENT_CLINIC_OR_DEPARTMENT_OTHER): Payer: BLUE CROSS/BLUE SHIELD

## 2017-03-11 ENCOUNTER — Other Ambulatory Visit: Payer: Self-pay

## 2017-03-11 ENCOUNTER — Ambulatory Visit (HOSPITAL_BASED_OUTPATIENT_CLINIC_OR_DEPARTMENT_OTHER): Payer: BLUE CROSS/BLUE SHIELD | Admitting: Family

## 2017-03-11 ENCOUNTER — Other Ambulatory Visit (HOSPITAL_BASED_OUTPATIENT_CLINIC_OR_DEPARTMENT_OTHER): Payer: BLUE CROSS/BLUE SHIELD

## 2017-03-11 DIAGNOSIS — Z5112 Encounter for antineoplastic immunotherapy: Secondary | ICD-10-CM | POA: Diagnosis not present

## 2017-03-11 DIAGNOSIS — Z23 Encounter for immunization: Secondary | ICD-10-CM | POA: Diagnosis not present

## 2017-03-11 DIAGNOSIS — C9001 Multiple myeloma in remission: Secondary | ICD-10-CM

## 2017-03-11 DIAGNOSIS — Z9484 Stem cells transplant status: Secondary | ICD-10-CM | POA: Diagnosis not present

## 2017-03-11 LAB — CBC WITH DIFFERENTIAL (CANCER CENTER ONLY)
BASO#: 0 10*3/uL (ref 0.0–0.2)
BASO%: 0.3 % (ref 0.0–2.0)
EOS%: 1.4 % (ref 0.0–7.0)
Eosinophils Absolute: 0 10*3/uL (ref 0.0–0.5)
HCT: 35.9 % — ABNORMAL LOW (ref 38.7–49.9)
HGB: 13 g/dL (ref 13.0–17.1)
LYMPH#: 1 10*3/uL (ref 0.9–3.3)
LYMPH%: 35.8 % (ref 14.0–48.0)
MCH: 36.2 pg — ABNORMAL HIGH (ref 28.0–33.4)
MCHC: 36.2 g/dL — ABNORMAL HIGH (ref 32.0–35.9)
MCV: 100 fL — ABNORMAL HIGH (ref 82–98)
MONO#: 0.3 10*3/uL (ref 0.1–0.9)
MONO%: 8.7 % (ref 0.0–13.0)
NEUT#: 1.6 10*3/uL (ref 1.5–6.5)
NEUT%: 53.8 % (ref 40.0–80.0)
Platelets: 148 10*3/uL (ref 145–400)
RBC: 3.59 10*6/uL — ABNORMAL LOW (ref 4.20–5.70)
RDW: 12.9 % (ref 11.1–15.7)
WBC: 2.9 10*3/uL — ABNORMAL LOW (ref 4.0–10.0)

## 2017-03-11 LAB — CMP (CANCER CENTER ONLY)
ALT(SGPT): 53 U/L — ABNORMAL HIGH (ref 10–47)
AST: 36 U/L (ref 11–38)
Albumin: 3.7 g/dL (ref 3.3–5.5)
Alkaline Phosphatase: 69 U/L (ref 26–84)
BUN, Bld: 19 mg/dL (ref 7–22)
CO2: 28 mEq/L (ref 18–33)
Calcium: 8.9 mg/dL (ref 8.0–10.3)
Chloride: 106 mEq/L (ref 98–108)
Creat: 1.5 mg/dl — ABNORMAL HIGH (ref 0.6–1.2)
Glucose, Bld: 99 mg/dL (ref 73–118)
Potassium: 4 mEq/L (ref 3.3–4.7)
Sodium: 141 mEq/L (ref 128–145)
Total Bilirubin: 0.6 mg/dl (ref 0.20–1.60)
Total Protein: 6.8 g/dL (ref 6.4–8.1)

## 2017-03-11 MED ORDER — ZOSTER VAC RECOMB ADJUVANTED 50 MCG/0.5ML IM SUSR
0.5000 mL | Freq: Once | INTRAMUSCULAR | Status: AC
  Administered 2017-03-11: 0.5 mL via INTRAMUSCULAR
  Filled 2017-03-11: qty 0.5

## 2017-03-11 MED ORDER — BORTEZOMIB CHEMO SQ INJECTION 3.5 MG (2.5MG/ML)
3.0000 mg | Freq: Once | INTRAMUSCULAR | Status: AC
  Administered 2017-03-11: 3 mg via SUBCUTANEOUS
  Filled 2017-03-11: qty 3

## 2017-03-11 NOTE — Progress Notes (Signed)
Pt. Discharged at 12:30

## 2017-03-11 NOTE — Patient Instructions (Signed)
[facility] Discharge Instructions for Patients Receiving Chemotherapy  Today you received the following chemotherapy agents Bortezomib.  To help prevent nausea and vomiting after your treatment, we encourage you to take your nausea medication per your MD instructions.   If you develop nausea and vomiting that is not controlled by your nausea medication, call the clinic.   BELOW ARE SYMPTOMS THAT SHOULD BE REPORTED IMMEDIATELY:  *FEVER GREATER THAN 100.5 F  *CHILLS WITH OR WITHOUT FEVER  NAUSEA AND VOMITING THAT IS NOT CONTROLLED WITH YOUR NAUSEA MEDICATION  *UNUSUAL SHORTNESS OF BREATH  *UNUSUAL BRUISING OR BLEEDING  TENDERNESS IN MOUTH AND THROAT WITH OR WITHOUT PRESENCE OF ULCERS  *URINARY PROBLEMS  *BOWEL PROBLEMS  UNUSUAL RASH Items with * indicate a potential emergency and should be followed up as soon as possible.  Feel free to call the clinic should you have any questions or concerns. The clinic phone number is [phone].  Please show the [facility] at check-in to the Emergency Department and triage nurse.

## 2017-03-11 NOTE — Progress Notes (Signed)
Hematology and Oncology Follow Up Visit  Tyler Phillips [id number] [birth year] 51 y.o. 03/11/2017   Principle Diagnosis:  IgG Kappa myeloma  Current Therapy:   Status post autologous stem cell transplant on 05/22/2015   S/p Cycle #6 of RVD Velcade q 2wk dosing/Revlimid 10mg  po q day (21/7)    Interim History:  Tyler Phillips is here today with his daughter for follow-up and Velcade. He is doing well and has no complaints at this time.  Earlier this month his M-spike was not detected, IgG was 1,291 mg/dL and kappa light chain was 2.6 mg/dL.  He has had no issue with infections. No fever, chills, n/v, cough, rash, dizziness, SOB, chest pain, palpitations, abdominal pain or changes in bowel or bladder habits. No swelling or tenderness in his extremities. The neuropathy in his feet is unchanged.  He has maintained a good appetite and is staying well hydrated. His weight is stable.    ECOG Performance Status: 1 - Symptomatic but completely ambulatory  Medications:  Allergies as of 03/11/2017   No Known Allergies     Medication List        Accurate as of 03/11/17  1:40 PM. Always use your most recent med list.          aspirin 325 MG tablet Take 325 mg by mouth daily.   cetirizine 10 MG tablet Commonly known as:  ZYRTEC Take 10 mg by mouth daily.   lenalidomide 10 MG capsule Commonly known as:  REVLIMID Take 1 capsule daily for 21 days on and 7 days off. [id number]   LORazepam 1 MG tablet Commonly known as:  ATIVAN Take 1 mg by mouth every 4 (four) hours as needed.   oxyCODONE 5 MG immediate release tablet Commonly known as:  Oxy IR/ROXICODONE Take 5 mg by mouth every 4 (four) hours as needed.   penicillin v potassium 500 MG tablet Commonly known as:  VEETID TAKE ONE TABLET (500 MG TOTAL) BY MOUTH EVERY 6 HOURS FOR 7 DAYS.   prochlorperazine 10 MG tablet Commonly known as:  COMPAZINE Take 10 mg by mouth every 6 (six) hours as needed.   valACYclovir 500 MG  tablet Commonly known as:  VALTREX Take 500 mg by mouth 2 (two) times daily.   Vitamin D3 1000 units Caps Take by mouth.       Allergies: No Known Allergies  Past Medical History, Surgical history, Social history, and Family History were reviewed and updated.  Review of Systems: All other 10 point review of systems is negative.   Physical Exam:  vitals were not taken for this visit.   Wt Readings from Last 3 Encounters:  02/25/17 222 lb (100.7 kg)  12/31/16 222 lb (100.7 kg)  12/03/16 221 lb 3 oz (100.3 kg)    Ocular: Sclerae unicteric, pupils equal, round and reactive to light Ear-nose-throat: Oropharynx clear, dentition fair Lymphatic: No cervical, supraclavicular or axillary adenopathy Lungs no rales or rhonchi, good excursion bilaterally Heart regular rate and rhythm, no murmur appreciated Abd soft, nontender, positive bowel sounds, no liver or spleen tip palpated on exam, no fluid wave MSK no focal spinal tenderness, no joint edema Neuro: non-focal, well-oriented, appropriate affect Breasts: Deferred   Lab Results  Component Value Date   WBC 2.9 (L) 03/11/2017   HGB 13.0 03/11/2017   HCT 35.9 (L) 03/11/2017   MCV 100 (H) 03/11/2017   PLT 148 03/11/2017   Lab Results  Component Value Date   FERRITIN 1,263 (H) 07/20/2014   IRON  125 07/20/2014   TIBC 198 (L) 07/20/2014   UIBC 73 (L) 07/20/2014   IRONPCTSAT 63 (H) 07/20/2014   Lab Results  Component Value Date   RETICCTPCT 0.8 07/20/2014   RBC 3.59 (L) 03/11/2017   Lab Results  Component Value Date   KPAFRELGTCHN 1.52 03/28/2015   LAMBDASER 0.87 03/28/2015   KAPLAMBRATIO 1.62 02/25/2017   Lab Results  Component Value Date   IGGSERUM 1,291 02/25/2017   IGA 92 03/28/2015   IGMSERUM 22 02/25/2017   Lab Results  Component Value Date   TOTALPROTELP 7.0 03/28/2015   ALBUMINELP 4.3 03/28/2015   A1GS 0.3 03/28/2015   A2GS 0.7 03/28/2015   BETS 0.4 03/28/2015   BETA2SER 0.3 03/28/2015   GAMS 1.2  03/28/2015   MSPIKE Not Observed 02/25/2017   SPEI * 03/28/2015     Chemistry      Component Value Date/Time   NA 141 03/11/2017 1008   NA 138 09/03/2015 1042   K 4.0 03/11/2017 1008   K 4.3 09/03/2015 1042   CL 106 03/11/2017 1008   CO2 28 03/11/2017 1008   CO2 25 09/03/2015 1042   BUN 19 03/11/2017 1008   BUN 21.7 09/03/2015 1042   CREATININE 1.5 (H) 03/11/2017 1008   CREATININE 1.2 09/03/2015 1042      Component Value Date/Time   CALCIUM 8.9 03/11/2017 1008   CALCIUM 9.1 09/03/2015 1042   ALKPHOS 69 03/11/2017 1008   ALKPHOS 42 09/03/2015 1042   AST 36 03/11/2017 1008   AST 27 09/03/2015 1042   ALT 53 (H) 03/11/2017 1008   ALT 41 09/03/2015 1042   BILITOT 0.60 03/11/2017 1008   BILITOT 0.54 09/03/2015 1042      Impression and Plan: Tyler Phillips is a very pleasant 51 yo caucasian gentleman with IgG Kappa Myeloma s/p autologous transplant in February 2012. He has done well and is tolerating Velcade every 2 weeks and Revlimid nicely. He has no complaints at this time.  We will continue him on his every 2 weeks injection schedule with follow-up and repeat lab work in 1 month.  Both he and his wife are good to contact our office with any questions or concerns. We can certainly see him sooner if need be.   Eliezer Bottom, NP 11/23/20181:40 PM

## 2017-03-12 LAB — IGG, IGA, IGM
IgA, Qn, Serum: 111 mg/dL (ref 90–386)
IgG, Qn, Serum: 1255 mg/dL (ref 700–1600)
IgM, Qn, Serum: 19 mg/dL — ABNORMAL LOW (ref 20–172)

## 2017-03-14 LAB — PROTEIN ELECTROPHORESIS, SERUM, WITH REFLEX
A/G Ratio: 1.4 (ref 0.7–1.7)
Albumin: 3.8 g/dL (ref 2.9–4.4)
Alpha 1: 0.2 g/dL (ref 0.0–0.4)
Alpha 2: 0.6 g/dL (ref 0.4–1.0)
Beta: 0.8 g/dL (ref 0.7–1.3)
Gamma Globulin: 1.2 g/dL (ref 0.4–1.8)
Globulin, Total: 2.8 g/dL (ref 2.2–3.9)
Total Protein: 6.6 g/dL (ref 6.0–8.5)

## 2017-03-14 LAB — KAPPA/LAMBDA LIGHT CHAINS
Ig Kappa Free Light Chain: 22.4 mg/L — ABNORMAL HIGH (ref 3.3–19.4)
Ig Lambda Free Light Chain: 15.8 mg/L (ref 5.7–26.3)
Kappa/Lambda FluidC Ratio: 1.42 (ref 0.26–1.65)

## 2017-03-15 ENCOUNTER — Telehealth: Payer: Self-pay | Admitting: *Deleted

## 2017-03-15 NOTE — Telephone Encounter (Addendum)
Message left on personal voice mail  ----- Message from [name], MD sent at 03/14/2017  4:38 PM EST ----- Call - NO myeloma protein in the blood!!  Tyler Phillips

## 2017-03-24 ENCOUNTER — Other Ambulatory Visit: Payer: Self-pay | Admitting: *Deleted

## 2017-03-24 DIAGNOSIS — C9002 Multiple myeloma in relapse: Secondary | ICD-10-CM

## 2017-03-24 DIAGNOSIS — C9001 Multiple myeloma in remission: Secondary | ICD-10-CM

## 2017-03-24 MED ORDER — LENALIDOMIDE 10 MG PO CAPS: ORAL_CAPSULE | ORAL | 0 refills | Status: DC

## 2017-03-25 ENCOUNTER — Other Ambulatory Visit (HOSPITAL_BASED_OUTPATIENT_CLINIC_OR_DEPARTMENT_OTHER): Payer: BLUE CROSS/BLUE SHIELD

## 2017-03-25 ENCOUNTER — Ambulatory Visit (HOSPITAL_BASED_OUTPATIENT_CLINIC_OR_DEPARTMENT_OTHER): Payer: BLUE CROSS/BLUE SHIELD

## 2017-03-25 ENCOUNTER — Ambulatory Visit: Payer: BLUE CROSS/BLUE SHIELD | Admitting: Hematology & Oncology

## 2017-03-25 VITALS — BP 128/69 | HR 56 | Temp 98.2°F | Resp 17

## 2017-03-25 DIAGNOSIS — Z5112 Encounter for antineoplastic immunotherapy: Secondary | ICD-10-CM | POA: Diagnosis not present

## 2017-03-25 DIAGNOSIS — C9001 Multiple myeloma in remission: Secondary | ICD-10-CM

## 2017-03-25 LAB — CMP (CANCER CENTER ONLY)
ALT(SGPT): 60 U/L — ABNORMAL HIGH (ref 10–47)
AST: 35 U/L (ref 11–38)
Albumin: 3.8 g/dL (ref 3.3–5.5)
Alkaline Phosphatase: 62 U/L (ref 26–84)
BUN, Bld: 19 mg/dL (ref 7–22)
CO2: 24 mEq/L (ref 18–33)
Calcium: 9 mg/dL (ref 8.0–10.3)
Chloride: 107 mEq/L (ref 98–108)
Creat: 1.5 mg/dl — ABNORMAL HIGH (ref 0.6–1.2)
Glucose, Bld: 103 mg/dL (ref 73–118)
Potassium: 3.7 mEq/L (ref 3.3–4.7)
Sodium: 142 mEq/L (ref 128–145)
Total Bilirubin: 0.8 mg/dl (ref 0.20–1.60)
Total Protein: 6.8 g/dL (ref 6.4–8.1)

## 2017-03-25 LAB — CBC WITH DIFFERENTIAL (CANCER CENTER ONLY)
BASO#: 0 10*3/uL (ref 0.0–0.2)
BASO%: 0.3 % (ref 0.0–2.0)
EOS%: 6.2 % (ref 0.0–7.0)
Eosinophils Absolute: 0.2 10*3/uL (ref 0.0–0.5)
HCT: 36.1 % — ABNORMAL LOW (ref 38.7–49.9)
HGB: 13.1 g/dL (ref 13.0–17.1)
LYMPH#: 0.9 10*3/uL (ref 0.9–3.3)
LYMPH%: 29.4 % (ref 14.0–48.0)
MCH: 36.4 pg — ABNORMAL HIGH (ref 28.0–33.4)
MCHC: 36.2 g/dL — ABNORMAL HIGH (ref 32.0–35.9)
MCV: 99 fL — ABNORMAL HIGH (ref 82–98)
MONO#: 0.3 10*3/uL (ref 0.1–0.9)
MONO%: 10.5 % (ref 0.0–13.0)
NEUT#: 1.6 10*3/uL (ref 1.5–6.5)
NEUT%: 53.6 % (ref 40.0–80.0)
Platelets: 148 10*3/uL (ref 145–400)
RBC: 3.63 10*6/uL — ABNORMAL LOW (ref 4.20–5.70)
RDW: 13.2 % (ref 11.1–15.7)
WBC: 3.1 10*3/uL — ABNORMAL LOW (ref 4.0–10.0)

## 2017-03-25 MED ORDER — BORTEZOMIB CHEMO SQ INJECTION 3.5 MG (2.5MG/ML)
3.0000 mg | Freq: Once | INTRAMUSCULAR | Status: AC
  Administered 2017-03-25: 3 mg via SUBCUTANEOUS
  Filled 2017-03-25: qty 3

## 2017-04-07 ENCOUNTER — Other Ambulatory Visit: Payer: Self-pay | Admitting: *Deleted

## 2017-04-07 DIAGNOSIS — C9001 Multiple myeloma in remission: Secondary | ICD-10-CM

## 2017-04-08 ENCOUNTER — Other Ambulatory Visit (HOSPITAL_BASED_OUTPATIENT_CLINIC_OR_DEPARTMENT_OTHER): Payer: BLUE CROSS/BLUE SHIELD

## 2017-04-08 ENCOUNTER — Encounter: Payer: Self-pay | Admitting: Hematology & Oncology

## 2017-04-08 ENCOUNTER — Other Ambulatory Visit: Payer: Self-pay

## 2017-04-08 ENCOUNTER — Ambulatory Visit (HOSPITAL_BASED_OUTPATIENT_CLINIC_OR_DEPARTMENT_OTHER): Payer: BLUE CROSS/BLUE SHIELD | Admitting: Hematology & Oncology

## 2017-04-08 ENCOUNTER — Ambulatory Visit (HOSPITAL_BASED_OUTPATIENT_CLINIC_OR_DEPARTMENT_OTHER): Payer: BLUE CROSS/BLUE SHIELD

## 2017-04-08 VITALS — BP 125/83 | HR 58 | Temp 97.9°F | Resp 18 | Wt 221.0 lb

## 2017-04-08 DIAGNOSIS — Z5112 Encounter for antineoplastic immunotherapy: Secondary | ICD-10-CM | POA: Diagnosis not present

## 2017-04-08 DIAGNOSIS — C9001 Multiple myeloma in remission: Secondary | ICD-10-CM

## 2017-04-08 LAB — CBC WITH DIFFERENTIAL (CANCER CENTER ONLY)
BASO#: 0 10*3/uL (ref 0.0–0.2)
BASO%: 0.3 % (ref 0.0–2.0)
EOS%: 2 % (ref 0.0–7.0)
Eosinophils Absolute: 0.1 10*3/uL (ref 0.0–0.5)
HCT: 35.3 % — ABNORMAL LOW (ref 38.7–49.9)
HGB: 12.9 g/dL — ABNORMAL LOW (ref 13.0–17.1)
LYMPH#: 1 10*3/uL (ref 0.9–3.3)
LYMPH%: 33.9 % (ref 14.0–48.0)
MCH: 36 pg — ABNORMAL HIGH (ref 28.0–33.4)
MCHC: 36.5 g/dL — ABNORMAL HIGH (ref 32.0–35.9)
MCV: 99 fL — ABNORMAL HIGH (ref 82–98)
MONO#: 0.3 10*3/uL (ref 0.1–0.9)
MONO%: 10.2 % (ref 0.0–13.0)
NEUT#: 1.6 10*3/uL (ref 1.5–6.5)
NEUT%: 53.6 % (ref 40.0–80.0)
Platelets: 142 10*3/uL — ABNORMAL LOW (ref 145–400)
RBC: 3.58 10*6/uL — ABNORMAL LOW (ref 4.20–5.70)
RDW: 13.5 % (ref 11.1–15.7)
WBC: 3 10*3/uL — ABNORMAL LOW (ref 4.0–10.0)

## 2017-04-08 LAB — CMP (CANCER CENTER ONLY)
ALT(SGPT): 66 U/L — ABNORMAL HIGH (ref 10–47)
AST: 93 U/L — ABNORMAL HIGH (ref 11–38)
Albumin: 3.7 g/dL (ref 3.3–5.5)
Alkaline Phosphatase: 67 U/L (ref 26–84)
BUN, Bld: 12 mg/dL (ref 7–22)
CO2: 24 mEq/L (ref 18–33)
Calcium: 8.7 mg/dL (ref 8.0–10.3)
Chloride: 108 mEq/L (ref 98–108)
Creat: 1.3 mg/dl — ABNORMAL HIGH (ref 0.6–1.2)
Glucose, Bld: 105 mg/dL (ref 73–118)
Potassium: 3.6 mEq/L (ref 3.3–4.7)
Sodium: 144 mEq/L (ref 128–145)
Total Bilirubin: 0.9 mg/dl (ref 0.20–1.60)
Total Protein: 6.8 g/dL (ref 6.4–8.1)

## 2017-04-08 MED ORDER — BORTEZOMIB CHEMO SQ INJECTION 3.5 MG (2.5MG/ML)
3.0000 mg | Freq: Once | INTRAMUSCULAR | Status: AC
  Administered 2017-04-08: 3 mg via SUBCUTANEOUS
  Filled 2017-04-08: qty 3

## 2017-04-08 NOTE — Progress Notes (Signed)
Hematology and Oncology Follow Up Visit  Tyler Phillips [id number] 01/16/1966 51 y.o. 04/08/2017   Principle Diagnosis:   IgG Kappa myeloma  Current Therapy:  Status post autologous stem cell transplant on 05/22/2015   S/p Cycle #6 of RVD  Velcade q 2wk dosing/Revlimid 10mg  po q day (21/7) -      Interim History:  Tyler Phillips is back for follow-up.  He is doing quite well.  He has had no problems.  He still working full-time.  He actually was down in [place] a week or so ago.  He and his family had no problems with the bad snow that we had a week or so ago.  They had a wonderful time for Thanksgiving.  He goes back to see the transplant doctors at [facility] in January.  His myeloma studies still do not show a monoclonal spike in his blood.  His IgG level was 1255 mg/dL.  His kappa light chain was 2.2 mg/dL.    He has had no fever.  He has had no issues with respiratory tract infections.  He has had no diarrhea.  There is been no nausea or vomiting.    Overall, his performance status is ECOG 0.   Medications:  Current Outpatient Medications:  .  aspirin 325 MG tablet, Take 325 mg by mouth daily., Disp: , Rfl:  .  bortezomib IV (VELCADE) 3.5 MG injection, Inject as directed every 14 (fourteen) days., Disp: , Rfl:  .  cetirizine (ZYRTEC) 10 MG tablet, Take 10 mg by mouth daily., Disp: , Rfl:  .  Cholecalciferol (VITAMIN D3) 1000 units CAPS, Take by mouth., Disp: , Rfl:  .  lenalidomide (REVLIMID) 10 MG capsule, Take 1 capsule daily for 21 days on and 7 days off. [id number], Disp: 21 capsule, Rfl: 0 .  LORazepam (ATIVAN) 1 MG tablet, Take 1 mg by mouth every 4 (four) hours as needed., Disp: , Rfl:  .  oxyCODONE (OXY IR/ROXICODONE) 5 MG immediate release tablet, Take 5 mg by mouth every 4 (four) hours as needed., Disp: , Rfl:  .  penicillin v potassium (VEETID) 500 MG tablet, TAKE ONE TABLET (500 MG TOTAL) BY MOUTH EVERY 6 HOURS FOR 7 DAYS., Disp: , Rfl: 0 .  prochlorperazine  (COMPAZINE) 10 MG tablet, Take 10 mg by mouth every 6 (six) hours as needed., Disp: , Rfl:  .  valACYclovir (VALTREX) 500 MG tablet, Take 500 mg by mouth 2 (two) times daily. , Disp: , Rfl:  No current facility-administered medications for this visit.   Facility-Administered Medications Ordered in Other Visits:  .  bortezomib SQ (VELCADE) chemo injection 2.75 mg, 1.3 mg/m2 (Treatment Plan Recorded), Subcutaneous, Once, Ennever, Rudell Cobb, MD  Allergies: No Known Allergies  Past Medical History, Surgical history, Social history, and Family History were reviewed and updated.  Review of Systems: As stated in the interim history  Physical Exam: Vital signs show a temperature of 97.6. Also 58. Blood pressure 139/86. Weight is 224 pounds.  Wt Readings from Last 3 Encounters:  04/08/17 221 lb (100.2 kg)  02/25/17 222 lb (100.7 kg)  12/31/16 222 lb (100.7 kg)     Physical Exam  Constitutional: He is oriented to person, place, and time.  HENT:  Head: Normocephalic and atraumatic.  Mouth/Throat: Oropharynx is clear and moist.  Eyes: EOM are normal. Pupils are equal, round, and reactive to light.  Neck: Normal range of motion.  Cardiovascular: Normal rate, regular rhythm and normal heart sounds.  Pulmonary/Chest: Effort normal and  breath sounds normal.  Abdominal: Soft. Bowel sounds are normal.  Musculoskeletal: Normal range of motion. He exhibits no edema, tenderness or deformity.  Lymphadenopathy:    He has no cervical adenopathy.  Neurological: He is alert and oriented to person, place, and time.  Skin: Skin is warm and dry. No rash noted. No erythema.  Psychiatric: He has a normal mood and affect. His behavior is normal. Judgment and thought content normal.  Vitals reviewed.    Lab Results  Component Value Date   WBC 3.0 (L) 04/08/2017   HGB 12.9 (L) 04/08/2017   HCT 35.3 (L) 04/08/2017   MCV 99 (H) 04/08/2017   PLT 142 (L) 04/08/2017     Chemistry      Component Value  Date/Time   NA 144 04/08/2017 1049   NA 138 09/03/2015 1042   K 3.6 04/08/2017 1049   K 4.3 09/03/2015 1042   CL 108 04/08/2017 1049   CO2 24 04/08/2017 1049   CO2 25 09/03/2015 1042   BUN 12 04/08/2017 1049   BUN 21.7 09/03/2015 1042   CREATININE 1.3 (H) 04/08/2017 1049   CREATININE 1.2 09/03/2015 1042      Component Value Date/Time   CALCIUM 8.7 04/08/2017 1049   CALCIUM 9.1 09/03/2015 1042   ALKPHOS 67 04/08/2017 1049   ALKPHOS 42 09/03/2015 1042   AST 93 (H) 04/08/2017 1049   AST 27 09/03/2015 1042   ALT 66 (H) 04/08/2017 1049   ALT 41 09/03/2015 1042   BILITOT 0.90 04/08/2017 1049   BILITOT 0.54 09/03/2015 1042       Impression and Plan: Tyler Phillips is 51 year old gentleman with IgG Kappa myeloma. We finally got him to transplant. This was truly a process with him. It took almost a year to get him to transplant.  Everything looks fantastic.  I do not see anything that looks like recurrent myeloma.  We will continue him on the every 2-week Velcade.  I will plan to see him back in 1 month.     I am so glad that his son has been doing so well.  [name], MD 12/21/201811:49 AM

## 2017-04-09 LAB — IGG, IGA, IGM
IgA, Qn, Serum: 121 mg/dL (ref 90–386)
IgG, Qn, Serum: 1209 mg/dL (ref 700–1600)
IgM, Qn, Serum: 19 mg/dL — ABNORMAL LOW (ref 20–172)

## 2017-04-11 LAB — KAPPA/LAMBDA LIGHT CHAINS
Ig Kappa Free Light Chain: 27.5 mg/L — ABNORMAL HIGH (ref 3.3–19.4)
Ig Lambda Free Light Chain: 15.6 mg/L (ref 5.7–26.3)
Kappa/Lambda FluidC Ratio: 1.76 — ABNORMAL HIGH (ref 0.26–1.65)

## 2017-04-13 LAB — PROTEIN ELECTROPHORESIS, SERUM, WITH REFLEX
A/G Ratio: 1.3 (ref 0.7–1.7)
Albumin: 3.6 g/dL (ref 2.9–4.4)
Alpha 1: 0.2 g/dL (ref 0.0–0.4)
Alpha 2: 0.6 g/dL (ref 0.4–1.0)
Beta: 0.9 g/dL (ref 0.7–1.3)
Gamma Globulin: 1.2 g/dL (ref 0.4–1.8)
Globulin, Total: 2.8 g/dL (ref 2.2–3.9)
Total Protein: 6.4 g/dL (ref 6.0–8.5)

## 2017-04-14 ENCOUNTER — Encounter: Payer: Self-pay | Admitting: *Deleted

## 2017-04-20 ENCOUNTER — Other Ambulatory Visit: Payer: Self-pay | Admitting: *Deleted

## 2017-04-20 DIAGNOSIS — C9002 Multiple myeloma in relapse: Secondary | ICD-10-CM

## 2017-04-20 MED ORDER — LENALIDOMIDE 10 MG PO CAPS: ORAL_CAPSULE | ORAL | 0 refills | Status: DC

## 2017-04-22 ENCOUNTER — Other Ambulatory Visit (HOSPITAL_BASED_OUTPATIENT_CLINIC_OR_DEPARTMENT_OTHER): Payer: BLUE CROSS/BLUE SHIELD

## 2017-04-22 ENCOUNTER — Ambulatory Visit (HOSPITAL_BASED_OUTPATIENT_CLINIC_OR_DEPARTMENT_OTHER): Payer: BLUE CROSS/BLUE SHIELD

## 2017-04-22 VITALS — BP 134/83 | HR 59 | Temp 98.1°F | Resp 18

## 2017-04-22 DIAGNOSIS — C9001 Multiple myeloma in remission: Secondary | ICD-10-CM

## 2017-04-22 DIAGNOSIS — Z5112 Encounter for antineoplastic immunotherapy: Secondary | ICD-10-CM | POA: Diagnosis not present

## 2017-04-22 LAB — CBC WITH DIFFERENTIAL (CANCER CENTER ONLY)
BASO#: 0 10*3/uL (ref 0.0–0.2)
BASO%: 0.3 % (ref 0.0–2.0)
EOS%: 2.8 % (ref 0.0–7.0)
Eosinophils Absolute: 0.1 10*3/uL (ref 0.0–0.5)
HCT: 36.8 % — ABNORMAL LOW (ref 38.7–49.9)
HGB: 13.5 g/dL (ref 13.0–17.1)
LYMPH#: 1 10*3/uL (ref 0.9–3.3)
LYMPH%: 30.8 % (ref 14.0–48.0)
MCH: 36.6 pg — ABNORMAL HIGH (ref 28.0–33.4)
MCHC: 36.7 g/dL — ABNORMAL HIGH (ref 32.0–35.9)
MCV: 100 fL — ABNORMAL HIGH (ref 82–98)
MONO#: 0.4 10*3/uL (ref 0.1–0.9)
MONO%: 11.2 % (ref 0.0–13.0)
NEUT#: 1.8 10*3/uL (ref 1.5–6.5)
NEUT%: 54.9 % (ref 40.0–80.0)
Platelets: 143 10*3/uL — ABNORMAL LOW (ref 145–400)
RBC: 3.69 10*6/uL — ABNORMAL LOW (ref 4.20–5.70)
RDW: 13.3 % (ref 11.1–15.7)
WBC: 3.2 10*3/uL — ABNORMAL LOW (ref 4.0–10.0)

## 2017-04-22 LAB — CMP (CANCER CENTER ONLY)
ALT(SGPT): 45 U/L (ref 10–47)
AST: 29 U/L (ref 11–38)
Albumin: 3.7 g/dL (ref 3.3–5.5)
Alkaline Phosphatase: 59 U/L (ref 26–84)
BUN, Bld: 16 mg/dL (ref 7–22)
CO2: 24 mEq/L (ref 18–33)
Calcium: 8.5 mg/dL (ref 8.0–10.3)
Chloride: 105 mEq/L (ref 98–108)
Creat: 1.6 mg/dl — ABNORMAL HIGH (ref 0.6–1.2)
Glucose, Bld: 99 mg/dL (ref 73–118)
Potassium: 4 mEq/L (ref 3.3–4.7)
Sodium: 140 mEq/L (ref 128–145)
Total Bilirubin: 0.8 mg/dl (ref 0.20–1.60)
Total Protein: 6.9 g/dL (ref 6.4–8.1)

## 2017-04-22 MED ORDER — BORTEZOMIB CHEMO SQ INJECTION 3.5 MG (2.5MG/ML)
3.0000 mg | Freq: Once | INTRAMUSCULAR | Status: AC
  Administered 2017-04-22: 3 mg via SUBCUTANEOUS
  Filled 2017-04-22: qty 3

## 2017-04-25 ENCOUNTER — Other Ambulatory Visit: Payer: Self-pay | Admitting: *Deleted

## 2017-04-25 DIAGNOSIS — C9002 Multiple myeloma in relapse: Secondary | ICD-10-CM

## 2017-04-25 MED ORDER — LENALIDOMIDE 10 MG PO CAPS: ORAL_CAPSULE | ORAL | 0 refills | Status: DC

## 2017-05-06 ENCOUNTER — Encounter: Payer: Self-pay | Admitting: Hematology & Oncology

## 2017-05-06 ENCOUNTER — Inpatient Hospital Stay: Payer: BLUE CROSS/BLUE SHIELD

## 2017-05-06 ENCOUNTER — Other Ambulatory Visit: Payer: Self-pay

## 2017-05-06 ENCOUNTER — Inpatient Hospital Stay: Payer: BLUE CROSS/BLUE SHIELD | Attending: Hematology & Oncology | Admitting: Hematology & Oncology

## 2017-05-06 VITALS — BP 146/88 | HR 55 | Temp 97.5°F | Resp 17 | Wt 225.0 lb

## 2017-05-06 DIAGNOSIS — Z7982 Long term (current) use of aspirin: Secondary | ICD-10-CM | POA: Insufficient documentation

## 2017-05-06 DIAGNOSIS — C9001 Multiple myeloma in remission: Secondary | ICD-10-CM

## 2017-05-06 DIAGNOSIS — Z9484 Stem cells transplant status: Secondary | ICD-10-CM | POA: Insufficient documentation

## 2017-05-06 DIAGNOSIS — Z79899 Other long term (current) drug therapy: Secondary | ICD-10-CM | POA: Diagnosis not present

## 2017-05-06 DIAGNOSIS — C9000 Multiple myeloma not having achieved remission: Secondary | ICD-10-CM | POA: Diagnosis not present

## 2017-05-06 LAB — CMP (CANCER CENTER ONLY)
ALT: 64 U/L — ABNORMAL HIGH (ref 0–55)
AST: 31 U/L (ref 5–34)
Albumin: 3.7 g/dL (ref 3.5–5.0)
Alkaline Phosphatase: 61 U/L (ref 26–84)
Anion gap: 10 (ref 5–15)
BUN: 13 mg/dL (ref 7–22)
CO2: 25 mmol/L (ref 18–33)
Calcium: 8.9 mg/dL (ref 8.0–10.3)
Chloride: 107 mmol/L (ref 98–108)
Creatinine: 1.2 mg/dL (ref 0.70–1.30)
Glucose, Bld: 88 mg/dL (ref 70–118)
Potassium: 4.3 mmol/L (ref 3.3–4.7)
Sodium: 142 mmol/L (ref 128–145)
Total Bilirubin: 0.9 mg/dL (ref 0.2–1.2)
Total Protein: 6.9 g/dL (ref 6.4–8.1)

## 2017-05-06 LAB — CBC WITH DIFFERENTIAL (CANCER CENTER ONLY)
Basophils Absolute: 0 10*3/uL (ref 0.0–0.1)
Basophils Relative: 1 %
Eosinophils Absolute: 0 10*3/uL (ref 0.0–0.5)
Eosinophils Relative: 1 %
HCT: 36.5 % — ABNORMAL LOW (ref 38.7–49.9)
Hemoglobin: 13.2 g/dL (ref 13.0–17.1)
Lymphocytes Relative: 38 %
Lymphs Abs: 1.2 10*3/uL (ref 0.9–3.3)
MCH: 36.8 pg — ABNORMAL HIGH (ref 28.0–33.4)
MCHC: 36.2 g/dL — ABNORMAL HIGH (ref 32.0–35.9)
MCV: 101.7 fL — ABNORMAL HIGH (ref 82.0–98.0)
Monocytes Absolute: 0.4 10*3/uL (ref 0.1–0.9)
Monocytes Relative: 12 %
Neutro Abs: 1.5 10*3/uL (ref 1.5–6.5)
Neutrophils Relative %: 48 %
Platelet Count: 144 10*3/uL (ref 140–400)
RBC: 3.59 MIL/uL — ABNORMAL LOW (ref 4.20–5.70)
RDW: 13.4 % (ref 11.1–15.7)
WBC Count: 3.2 10*3/uL — ABNORMAL LOW (ref 4.0–10.3)

## 2017-05-06 MED ORDER — BORTEZOMIB CHEMO SQ INJECTION 3.5 MG (2.5MG/ML)
3.0000 mg | Freq: Once | INTRAMUSCULAR | Status: AC
  Administered 2017-05-06: 3 mg via SUBCUTANEOUS
  Filled 2017-05-06: qty 3

## 2017-05-06 NOTE — Progress Notes (Signed)
Hematology and Oncology Follow Up Visit  Tyler Phillips [id number] [birth year] 52 y.o. 05/06/2017   Principle Diagnosis:   IgG Kappa myeloma  Current Therapy:  Status post autologous stem cell transplant on 05/22/2015   S/p Cycle #6 of RVD  Velcade q 2wk dosing/Revlimid 10mg  po q day (21/7) -      Interim History:  [name] is back for follow-up.  He and his family had no problems over the holiday season.  It sounds like he will be very busy for 2019.  He will be traveling quite a bit.  I talked to him about the possibility of switching over from Velcade to [facility].  I think this would be very reasonable for him.  Given his schedule and potential traveling issues, I would think an oral agent once a week would really be beneficial.  I realize that he does not have relapsed disease.  However, given that he is post stem cell transplant, his insurance might agree to do allow him to use [place].  He sees Dr. Laverta Phillips at [facility] in a couple weeks.  He is going to talk to him about this.  His myeloma studies have all look good.  There is no monoclonal spike in his blood.  He is exercising quite a bit.  He really is in great shape.  He has had no bony pain.  He has had no leg swelling.  He has had no change in bowel or bladder habits.  Overall, his performance status is ECOG 0.   Medications:  Current Outpatient Medications:  .  aspirin 325 MG tablet, Take 325 mg by mouth daily., Disp: , Rfl:  .  bortezomib IV (VELCADE) 3.5 MG injection, Inject as directed every 14 (fourteen) days., Disp: , Rfl:  .  cetirizine (ZYRTEC) 10 MG tablet, Take 10 mg by mouth daily., Disp: , Rfl:  .  Cholecalciferol (VITAMIN D3) 1000 units CAPS, Take by mouth., Disp: , Rfl:  .  clindamycin (CLEOCIN) 300 MG capsule, Take 600 mg by mouth See admin instructions. 1 hour prior to dental appts, Disp: , Rfl:  .  lenalidomide (REVLIMID) 10 MG capsule, Take 1 capsule daily for 21 days on and 7 days off. [id number]#[id number], Disp:  21 capsule, Rfl: 0 .  LORazepam (ATIVAN) 1 MG tablet, Take 1 mg by mouth every 4 (four) hours as needed., Disp: , Rfl:  .  oxyCODONE (OXY IR/ROXICODONE) 5 MG immediate release tablet, Take 5 mg by mouth every 4 (four) hours as needed., Disp: , Rfl:  .  penicillin v potassium (VEETID) 500 MG tablet, TAKE ONE TABLET (500 MG TOTAL) BY MOUTH EVERY 6 HOURS FOR 7 DAYS., Disp: , Rfl: 0 .  prochlorperazine (COMPAZINE) 10 MG tablet, Take 10 mg by mouth every 6 (six) hours as needed., Disp: , Rfl:  .  valACYclovir (VALTREX) 500 MG tablet, Take 500 mg by mouth 2 (two) times daily. , Disp: , Rfl:   Allergies: No Known Allergies  Past Medical History, Surgical history, Social history, and Family History were reviewed and updated.  Review of Systems: Review of Systems  All other systems reviewed and are negative.   Physical Exam: Vital signs show a temperature of 97.6. Also 53. Blood pressure 139/86. Weight is 224 pounds.  Wt Readings from Last 3 Encounters:  05/06/17 225 lb (102.1 kg)  04/08/17 221 lb (100.2 kg)  02/25/17 222 lb (100.7 kg)     Physical Exam  Constitutional: He is oriented to person, place, and time.  HENT:  Head: Normocephalic and atraumatic.  Mouth/Throat: Oropharynx is clear and moist.  Eyes: EOM are normal. Pupils are equal, round, and reactive to light.  Neck: Normal range of motion.  Cardiovascular: Normal rate, regular rhythm and normal heart sounds.  Pulmonary/Chest: Effort normal and breath sounds normal.  Abdominal: Soft. Bowel sounds are normal.  Musculoskeletal: Normal range of motion. He exhibits no edema, tenderness or deformity.  Lymphadenopathy:    He has no cervical adenopathy.  Neurological: He is alert and oriented to person, place, and time.  Skin: Skin is warm and dry. No rash noted. No erythema.  Psychiatric: He has a normal mood and affect. His behavior is normal. Judgment and thought content normal.  Vitals reviewed.    Lab Results  Component  Value Date   WBC 3.2 (L) 05/06/2017   HGB 13.5 04/22/2017   HCT 36.5 (L) 05/06/2017   MCV 101.7 (H) 05/06/2017   PLT 144 05/06/2017     Chemistry      Component Value Date/Time   NA 142 05/06/2017 1025   NA 140 04/22/2017 1140   NA 138 09/03/2015 1042   K 4.3 05/06/2017 1025   K 4.0 04/22/2017 1140   K 4.3 09/03/2015 1042   CL 107 05/06/2017 1025   CL 105 04/22/2017 1140   CO2 25 05/06/2017 1025   CO2 24 04/22/2017 1140   CO2 25 09/03/2015 1042   BUN 13 05/06/2017 1025   BUN 16 04/22/2017 1140   BUN 21.7 09/03/2015 1042   CREATININE 1.6 (H) 04/22/2017 1140   CREATININE 1.2 09/03/2015 1042      Component Value Date/Time   CALCIUM 8.9 05/06/2017 1025   CALCIUM 8.5 04/22/2017 1140   CALCIUM 9.1 09/03/2015 1042   ALKPHOS 61 05/06/2017 1025   ALKPHOS 59 04/22/2017 1140   ALKPHOS 42 09/03/2015 1042   AST 31 05/06/2017 1025   AST 27 09/03/2015 1042   ALT 64 (H) 05/06/2017 1025   ALT 45 04/22/2017 1140   ALT 41 09/03/2015 1042   BILITOT 0.9 05/06/2017 1025   BILITOT 0.54 09/03/2015 1042       Impression and Plan: [name] is 52 year old gentleman with IgG Kappa myeloma. We finally got him to transplant. This was truly a process with him. It took almost a year to get him to transplant.  He finally had his transplant in February 2017.  So far, there is no evidence of recurrent or relapsed disease.  He will get his Velcade today.  We will plan to see him back in another month.  We will see what Dr. Laverta Phillips thinks about [facility] for him.   [name], MD 1/18/20192:57 PM

## 2017-05-07 LAB — IGG, IGA, IGM
IgA: 117 mg/dL (ref 90–386)
IgG (Immunoglobin G), Serum: 1263 mg/dL (ref 700–1600)
IgM (Immunoglobulin M), Srm: 18 mg/dL — ABNORMAL LOW (ref 20–172)

## 2017-05-09 LAB — KAPPA/LAMBDA LIGHT CHAINS
Kappa free light chain: 26.7 mg/L — ABNORMAL HIGH (ref 3.3–19.4)
Kappa, lambda light chain ratio: 1.26 (ref 0.26–1.65)
Lambda free light chains: 21.2 mg/L (ref 5.7–26.3)

## 2017-05-10 LAB — PROTEIN ELECTROPHORESIS, SERUM, WITH REFLEX
A/G Ratio: 1.3 (ref 0.7–1.7)
Albumin ELP: 3.9 g/dL (ref 2.9–4.4)
Alpha-1-Globulin: 0.2 g/dL (ref 0.0–0.4)
Alpha-2-Globulin: 0.6 g/dL (ref 0.4–1.0)
Beta Globulin: 1 g/dL (ref 0.7–1.3)
Gamma Globulin: 1.3 g/dL (ref 0.4–1.8)
Globulin, Total: 3.1 g/dL (ref 2.2–3.9)
Total Protein ELP: 7 g/dL (ref 6.0–8.5)

## 2017-05-18 ENCOUNTER — Other Ambulatory Visit: Payer: Self-pay | Admitting: *Deleted

## 2017-05-18 DIAGNOSIS — C9002 Multiple myeloma in relapse: Secondary | ICD-10-CM

## 2017-05-18 MED ORDER — LENALIDOMIDE 10 MG PO CAPS
ORAL_CAPSULE | ORAL | 0 refills | Status: DC
Start: 2017-05-18 — End: 2017-05-19

## 2017-05-19 ENCOUNTER — Other Ambulatory Visit: Payer: Self-pay | Admitting: *Deleted

## 2017-05-19 DIAGNOSIS — C9001 Multiple myeloma in remission: Secondary | ICD-10-CM

## 2017-05-19 DIAGNOSIS — C9002 Multiple myeloma in relapse: Secondary | ICD-10-CM

## 2017-05-19 MED ORDER — LENALIDOMIDE 10 MG PO CAPS: ORAL_CAPSULE | ORAL | 0 refills | Status: DC

## 2017-05-20 ENCOUNTER — Inpatient Hospital Stay: Payer: BLUE CROSS/BLUE SHIELD | Attending: Hematology & Oncology

## 2017-05-20 ENCOUNTER — Other Ambulatory Visit: Payer: Self-pay | Admitting: *Deleted

## 2017-05-20 ENCOUNTER — Inpatient Hospital Stay: Payer: BLUE CROSS/BLUE SHIELD

## 2017-05-20 VITALS — BP 129/76 | HR 59 | Temp 97.6°F | Resp 1

## 2017-05-20 DIAGNOSIS — C9002 Multiple myeloma in relapse: Secondary | ICD-10-CM

## 2017-05-20 DIAGNOSIS — C9001 Multiple myeloma in remission: Secondary | ICD-10-CM | POA: Diagnosis not present

## 2017-05-20 DIAGNOSIS — Z79899 Other long term (current) drug therapy: Secondary | ICD-10-CM | POA: Diagnosis not present

## 2017-05-20 DIAGNOSIS — Z9484 Stem cells transplant status: Secondary | ICD-10-CM | POA: Insufficient documentation

## 2017-05-20 DIAGNOSIS — Z7982 Long term (current) use of aspirin: Secondary | ICD-10-CM | POA: Diagnosis not present

## 2017-05-20 LAB — CBC WITH DIFFERENTIAL (CANCER CENTER ONLY)
Basophils Absolute: 0 10*3/uL (ref 0.0–0.1)
Basophils Relative: 0 %
Eosinophils Absolute: 0.1 10*3/uL (ref 0.0–0.5)
Eosinophils Relative: 1 %
HCT: 36.1 % — ABNORMAL LOW (ref 38.7–49.9)
Hemoglobin: 13.1 g/dL (ref 13.0–17.1)
Lymphocytes Relative: 16 %
Lymphs Abs: 0.8 10*3/uL — ABNORMAL LOW (ref 0.9–3.3)
MCH: 36.7 pg — ABNORMAL HIGH (ref 28.0–33.4)
MCHC: 36.3 g/dL — ABNORMAL HIGH (ref 32.0–35.9)
MCV: 101.1 fL — ABNORMAL HIGH (ref 82.0–98.0)
Monocytes Absolute: 0.4 10*3/uL (ref 0.1–0.9)
Monocytes Relative: 9 %
Neutro Abs: 3.6 10*3/uL (ref 1.5–6.5)
Neutrophils Relative %: 74 %
Platelet Count: 136 10*3/uL — ABNORMAL LOW (ref 145–400)
RBC: 3.57 MIL/uL — ABNORMAL LOW (ref 4.20–5.70)
RDW: 13 % (ref 11.1–15.7)
WBC Count: 4.9 10*3/uL (ref 4.0–10.0)

## 2017-05-20 LAB — CMP (CANCER CENTER ONLY)
ALT: 57 U/L — ABNORMAL HIGH (ref 0–55)
AST: 39 U/L — ABNORMAL HIGH (ref 5–34)
Albumin: 3.8 g/dL (ref 3.5–5.0)
Alkaline Phosphatase: 63 U/L (ref 26–84)
Anion gap: 6 (ref 5–15)
BUN: 16 mg/dL (ref 7–22)
CO2: 25 mmol/L (ref 18–33)
Calcium: 9.1 mg/dL (ref 8.0–10.3)
Chloride: 110 mmol/L — ABNORMAL HIGH (ref 98–108)
Creatinine: 1.2 mg/dL (ref 0.70–1.30)
Glucose, Bld: 118 mg/dL (ref 73–118)
Potassium: 3.9 mmol/L (ref 3.3–4.7)
Sodium: 141 mmol/L (ref 128–145)
Total Bilirubin: 0.7 mg/dL (ref 0.2–1.2)
Total Protein: 7 g/dL (ref 6.4–8.1)

## 2017-05-20 MED ORDER — BORTEZOMIB CHEMO SQ INJECTION 3.5 MG (2.5MG/ML)
3.0000 mg | Freq: Once | INTRAMUSCULAR | Status: AC
  Administered 2017-05-20: 3 mg via SUBCUTANEOUS
  Filled 2017-05-20: qty 3

## 2017-05-20 MED ORDER — LENALIDOMIDE 10 MG PO CAPS: ORAL_CAPSULE | ORAL | 0 refills | Status: DC

## 2017-05-23 DIAGNOSIS — Z23 Encounter for immunization: Secondary | ICD-10-CM | POA: Diagnosis not present

## 2017-05-23 DIAGNOSIS — C9001 Multiple myeloma in remission: Secondary | ICD-10-CM | POA: Diagnosis not present

## 2017-05-23 DIAGNOSIS — Z9484 Stem cells transplant status: Secondary | ICD-10-CM | POA: Diagnosis not present

## 2017-05-23 DIAGNOSIS — Z8669 Personal history of other diseases of the nervous system and sense organs: Secondary | ICD-10-CM | POA: Diagnosis not present

## 2017-05-23 DIAGNOSIS — C9000 Multiple myeloma not having achieved remission: Secondary | ICD-10-CM | POA: Diagnosis not present

## 2017-05-23 DIAGNOSIS — Z8619 Personal history of other infectious and parasitic diseases: Secondary | ICD-10-CM | POA: Diagnosis not present

## 2017-06-03 ENCOUNTER — Encounter: Payer: Self-pay | Admitting: Hematology & Oncology

## 2017-06-03 ENCOUNTER — Inpatient Hospital Stay (HOSPITAL_BASED_OUTPATIENT_CLINIC_OR_DEPARTMENT_OTHER): Payer: BLUE CROSS/BLUE SHIELD | Admitting: Hematology & Oncology

## 2017-06-03 ENCOUNTER — Other Ambulatory Visit: Payer: Self-pay

## 2017-06-03 ENCOUNTER — Inpatient Hospital Stay: Payer: BLUE CROSS/BLUE SHIELD

## 2017-06-03 VITALS — BP 140/83 | HR 51 | Temp 97.9°F | Wt 224.8 lb

## 2017-06-03 DIAGNOSIS — C9001 Multiple myeloma in remission: Secondary | ICD-10-CM

## 2017-06-03 DIAGNOSIS — Z79899 Other long term (current) drug therapy: Secondary | ICD-10-CM | POA: Diagnosis not present

## 2017-06-03 DIAGNOSIS — Z9484 Stem cells transplant status: Secondary | ICD-10-CM

## 2017-06-03 DIAGNOSIS — Z7982 Long term (current) use of aspirin: Secondary | ICD-10-CM | POA: Diagnosis not present

## 2017-06-03 LAB — CBC WITH DIFFERENTIAL (CANCER CENTER ONLY)
Basophils Absolute: 0 10*3/uL (ref 0.0–0.1)
Basophils Relative: 1 %
Eosinophils Absolute: 0.1 10*3/uL (ref 0.0–0.5)
Eosinophils Relative: 3 %
HCT: 36.6 % — ABNORMAL LOW (ref 38.7–49.9)
Hemoglobin: 13.1 g/dL (ref 13.0–17.1)
Lymphocytes Relative: 34 %
Lymphs Abs: 1.1 10*3/uL (ref 0.9–3.3)
MCH: 36.8 pg — ABNORMAL HIGH (ref 28.0–33.4)
MCHC: 35.8 g/dL (ref 32.0–35.9)
MCV: 102.8 fL — ABNORMAL HIGH (ref 82.0–98.0)
Monocytes Absolute: 0.3 10*3/uL (ref 0.1–0.9)
Monocytes Relative: 10 %
Neutro Abs: 1.8 10*3/uL (ref 1.5–6.5)
Neutrophils Relative %: 52 %
Platelet Count: 156 10*3/uL (ref 145–400)
RBC: 3.56 MIL/uL — ABNORMAL LOW (ref 4.20–5.70)
RDW: 12.7 % (ref 11.1–15.7)
WBC Count: 3.3 10*3/uL — ABNORMAL LOW (ref 4.0–10.0)

## 2017-06-03 LAB — CMP (CANCER CENTER ONLY)
ALT: 40 U/L (ref 10–47)
AST: 29 U/L (ref 11–38)
Albumin: 3.6 g/dL (ref 3.5–5.0)
Alkaline Phosphatase: 65 U/L (ref 26–84)
Anion gap: 4 — ABNORMAL LOW (ref 5–15)
BUN: 12 mg/dL (ref 7–22)
CO2: 29 mmol/L (ref 18–33)
Calcium: 8.8 mg/dL (ref 8.0–10.3)
Chloride: 107 mmol/L (ref 98–108)
Creatinine: 1.4 mg/dL — ABNORMAL HIGH (ref 0.60–1.20)
Glucose, Bld: 97 mg/dL (ref 73–118)
Potassium: 4.3 mmol/L (ref 3.3–4.7)
Sodium: 140 mmol/L (ref 128–145)
Total Bilirubin: 0.8 mg/dL (ref 0.2–1.6)
Total Protein: 7.1 g/dL (ref 6.4–8.1)

## 2017-06-03 MED ORDER — BORTEZOMIB CHEMO SQ INJECTION 3.5 MG (2.5MG/ML)
3.0000 mg | Freq: Once | INTRAMUSCULAR | Status: AC
  Administered 2017-06-03: 3 mg via SUBCUTANEOUS
  Filled 2017-06-03: qty 3

## 2017-06-03 NOTE — Progress Notes (Signed)
Hematology and Oncology Follow Up Visit  Tyler Phillips [id number] 09-23-1965 52 y.o. 06/03/2017   Principle Diagnosis:   IgG Kappa myeloma - +4, +14, +17  Current Therapy:  Status post autologous stem cell transplant on 05/22/2015   S/p Cycle #6 of RVD  Velcade q 2wk dosing/Revlimid 10mg  po q day (21/7) -      Interim History:  Tyler Phillips is back for follow-up.  He is doing quite well.  Work is quite busy for him.  He saw Dr. Laverta Baltimore at [facility] recently.  After talking with Dr. Laverta Baltimore, he does not want to switch over to [facility].  He is quite happy with how well he is doing with the Velcade.  His last myeloma studies did not show a monoclonal spike in his serum.  He is exercising.  He is quite active physically.  He has had no tingling in the hands or feet.  He has had no nausea or vomiting.  There is been no change in bowel or bladder habits.  Previous had no leg swelling.    Overall, his performance status is ECOG 0.   Medications:  Current Outpatient Medications:  .  aspirin 325 MG tablet, Take 325 mg by mouth daily., Disp: , Rfl:  .  bortezomib IV (VELCADE) 3.5 MG injection, Inject as directed every 14 (fourteen) days., Disp: , Rfl:  .  cetirizine (ZYRTEC) 10 MG tablet, Take 10 mg by mouth daily., Disp: , Rfl:  .  Cholecalciferol (VITAMIN D3) 1000 units CAPS, Take by mouth., Disp: , Rfl:  .  clindamycin (CLEOCIN) 300 MG capsule, Take 600 mg by mouth See admin instructions. 1 hour prior to dental appts, Disp: , Rfl:  .  lenalidomide (REVLIMID) 10 MG capsule, Take 1 capsule daily for 21 days on and 7 days off. ATFT#[id number], Disp: 21 capsule, Rfl: 0 .  LORazepam (ATIVAN) 1 MG tablet, Take 1 mg by mouth every 4 (four) hours as needed., Disp: , Rfl:  .  oxyCODONE (OXY IR/ROXICODONE) 5 MG immediate release tablet, Take 5 mg by mouth every 4 (four) hours as needed., Disp: , Rfl:  .  penicillin v potassium (VEETID) 500 MG tablet, TAKE ONE TABLET (500 MG TOTAL) BY MOUTH EVERY 6 HOURS FOR 7  DAYS., Disp: , Rfl: 0 .  prochlorperazine (COMPAZINE) 10 MG tablet, Take 10 mg by mouth every 6 (six) hours as needed., Disp: , Rfl:  .  valACYclovir (VALTREX) 500 MG tablet, Take 500 mg by mouth 2 (two) times daily. , Disp: , Rfl:   Allergies: No Known Allergies  Past Medical History, Surgical history, Social history, and Family History were reviewed and updated.  Review of Systems: Review of Systems  Constitutional: Negative.   HENT: Negative.   Eyes: Negative.   Respiratory: Negative.   Cardiovascular: Negative.   Gastrointestinal: Negative.   Genitourinary: Negative.   Musculoskeletal: Negative.   Skin: Negative.   Neurological: Negative.   Endo/Heme/Allergies: Negative.   Psychiatric/Behavioral: Negative.      Physical Exam: Vital signs show a temperature of 97.6. Also 58. Blood pressure 139/86. Weight is 224 pounds.  Wt Readings from Last 3 Encounters:  06/03/17 224 lb 12.8 oz (102 kg)  05/06/17 225 lb (102.1 kg)  04/08/17 221 lb (100.2 kg)     Physical Exam  Constitutional: He is oriented to person, place, and time.  HENT:  Head: Normocephalic and atraumatic.  Mouth/Throat: Oropharynx is clear and moist.  Eyes: EOM are normal. Pupils are equal, round, and reactive to  light.  Neck: Normal range of motion.  Cardiovascular: Normal rate, regular rhythm and normal heart sounds.  Pulmonary/Chest: Effort normal and breath sounds normal.  Abdominal: Soft. Bowel sounds are normal.  Musculoskeletal: Normal range of motion. He exhibits no edema, tenderness or deformity.  Lymphadenopathy:    He has no cervical adenopathy.  Neurological: He is alert and oriented to person, place, and time.  Skin: Skin is warm and dry. No rash noted. No erythema.  Psychiatric: He has a normal mood and affect. His behavior is normal. Judgment and thought content normal.  Vitals reviewed.    Lab Results  Component Value Date   WBC 3.3 (L) 06/03/2017   HGB 13.5 04/22/2017   HCT 36.6  (L) 06/03/2017   MCV 102.8 (H) 06/03/2017   PLT 156 06/03/2017     Chemistry      Component Value Date/Time   NA 140 06/03/2017 1008   NA 140 04/22/2017 1140   NA 138 09/03/2015 1042   K 4.3 06/03/2017 1008   K 4.0 04/22/2017 1140   K 4.3 09/03/2015 1042   CL 107 06/03/2017 1008   CL 105 04/22/2017 1140   CO2 29 06/03/2017 1008   CO2 24 04/22/2017 1140   CO2 25 09/03/2015 1042   BUN 12 06/03/2017 1008   BUN 16 04/22/2017 1140   BUN 21.7 09/03/2015 1042   CREATININE 1.40 (H) 06/03/2017 1008   CREATININE 1.6 (H) 04/22/2017 1140   CREATININE 1.2 09/03/2015 1042      Component Value Date/Time   CALCIUM 8.8 06/03/2017 1008   CALCIUM 8.5 04/22/2017 1140   CALCIUM 9.1 09/03/2015 1042   ALKPHOS 65 06/03/2017 1008   ALKPHOS 59 04/22/2017 1140   ALKPHOS 42 09/03/2015 1042   AST 29 06/03/2017 1008   AST 27 09/03/2015 1042   ALT 40 06/03/2017 1008   ALT 45 04/22/2017 1140   ALT 41 09/03/2015 1042   BILITOT 0.8 06/03/2017 1008   BILITOT 0.54 09/03/2015 1042       Impression and Plan: Tyler Phillips is 52 year old gentleman with IgG Kappa myeloma. We finally got him to transplant. This was truly a process with him. It took almost a year to get him to transplant.  He finally had his transplant in February 2017.  For right now, everything is holding steady.  I think that Tyler Phillips is under the philosophy of "if it is not broke do not fix it."  I cannot argue with this.  We will continue him on the Velcade/Revlimid.  He will come in every 2 weeks for his Velcade.  I will see him back in 6 weeks.  [name], MD 2/15/201911:42 AM

## 2017-06-03 NOTE — Patient Instructions (Signed)

## 2017-06-04 LAB — IGG, IGA, IGM
IgA: 120 mg/dL (ref 90–386)
IgG (Immunoglobin G), Serum: 1261 mg/dL (ref 700–1600)
IgM (Immunoglobulin M), Srm: 28 mg/dL (ref 20–172)

## 2017-06-07 LAB — KAPPA/LAMBDA LIGHT CHAINS
Kappa free light chain: 28 mg/L — ABNORMAL HIGH (ref 3.3–19.4)
Kappa, lambda light chain ratio: 1.6 (ref 0.26–1.65)
Lambda free light chains: 17.5 mg/L (ref 5.7–26.3)

## 2017-06-08 LAB — PROTEIN ELECTROPHORESIS, SERUM, WITH REFLEX
A/G Ratio: 1.3 (ref 0.7–1.7)
Albumin ELP: 3.7 g/dL (ref 2.9–4.4)
Alpha-1-Globulin: 0.2 g/dL (ref 0.0–0.4)
Alpha-2-Globulin: 0.6 g/dL (ref 0.4–1.0)
Beta Globulin: 0.8 g/dL (ref 0.7–1.3)
Gamma Globulin: 1.2 g/dL (ref 0.4–1.8)
Globulin, Total: 2.8 g/dL (ref 2.2–3.9)
Total Protein ELP: 6.5 g/dL (ref 6.0–8.5)

## 2017-06-10 ENCOUNTER — Other Ambulatory Visit: Payer: Self-pay | Admitting: *Deleted

## 2017-06-10 DIAGNOSIS — C9002 Multiple myeloma in relapse: Secondary | ICD-10-CM

## 2017-06-10 MED ORDER — LENALIDOMIDE 10 MG PO CAPS: ORAL_CAPSULE | ORAL | 0 refills | Status: DC

## 2017-06-16 ENCOUNTER — Other Ambulatory Visit: Payer: Self-pay | Admitting: *Deleted

## 2017-06-16 DIAGNOSIS — C9001 Multiple myeloma in remission: Secondary | ICD-10-CM

## 2017-06-17 ENCOUNTER — Other Ambulatory Visit: Payer: Self-pay

## 2017-06-17 ENCOUNTER — Inpatient Hospital Stay: Payer: BLUE CROSS/BLUE SHIELD

## 2017-06-17 ENCOUNTER — Inpatient Hospital Stay: Payer: BLUE CROSS/BLUE SHIELD | Attending: Hematology & Oncology

## 2017-06-17 VITALS — BP 137/81 | HR 65 | Temp 97.8°F | Resp 18 | Wt 222.0 lb

## 2017-06-17 DIAGNOSIS — Z79899 Other long term (current) drug therapy: Secondary | ICD-10-CM | POA: Diagnosis not present

## 2017-06-17 DIAGNOSIS — C9001 Multiple myeloma in remission: Secondary | ICD-10-CM

## 2017-06-17 DIAGNOSIS — Z9484 Stem cells transplant status: Secondary | ICD-10-CM | POA: Diagnosis not present

## 2017-06-17 LAB — CMP (CANCER CENTER ONLY)
ALT: 47 U/L (ref 10–47)
AST: 31 U/L (ref 11–38)
Albumin: 3.7 g/dL (ref 3.5–5.0)
Alkaline Phosphatase: 55 U/L (ref 26–84)
Anion gap: 6 (ref 5–15)
BUN: 16 mg/dL (ref 7–22)
CO2: 25 mmol/L (ref 18–33)
Calcium: 8.3 mg/dL (ref 8.0–10.3)
Chloride: 109 mmol/L — ABNORMAL HIGH (ref 98–108)
Creatinine: 1 mg/dL (ref 0.60–1.20)
Glucose, Bld: 100 mg/dL (ref 73–118)
Potassium: 3.9 mmol/L (ref 3.3–4.7)
Sodium: 140 mmol/L (ref 128–145)
Total Bilirubin: 0.6 mg/dL (ref 0.2–1.6)
Total Protein: 6.9 g/dL (ref 6.4–8.1)

## 2017-06-17 LAB — CBC WITH DIFFERENTIAL (CANCER CENTER ONLY)
Basophils Absolute: 0 10*3/uL (ref 0.0–0.1)
Basophils Relative: 0 %
Eosinophils Absolute: 0.2 10*3/uL (ref 0.0–0.5)
Eosinophils Relative: 4 %
HCT: 36.9 % — ABNORMAL LOW (ref 38.7–49.9)
Hemoglobin: 13.2 g/dL (ref 13.0–17.1)
Lymphocytes Relative: 17 %
Lymphs Abs: 0.9 10*3/uL (ref 0.9–3.3)
MCH: 36.5 pg — ABNORMAL HIGH (ref 28.0–33.4)
MCHC: 35.8 g/dL (ref 32.0–35.9)
MCV: 101.9 fL — ABNORMAL HIGH (ref 82.0–98.0)
Monocytes Absolute: 0.5 10*3/uL (ref 0.1–0.9)
Monocytes Relative: 10 %
Neutro Abs: 3.5 10*3/uL (ref 1.5–6.5)
Neutrophils Relative %: 69 %
Platelet Count: 147 10*3/uL (ref 145–400)
RBC: 3.62 MIL/uL — ABNORMAL LOW (ref 4.20–5.70)
RDW: 12.6 % (ref 11.1–15.7)
WBC Count: 5.2 10*3/uL (ref 4.0–10.0)

## 2017-06-17 MED ORDER — BORTEZOMIB CHEMO SQ INJECTION 3.5 MG (2.5MG/ML)
3.0000 mg | Freq: Once | INTRAMUSCULAR | Status: AC
  Administered 2017-06-17: 3 mg via SUBCUTANEOUS
  Filled 2017-06-17: qty 3

## 2017-06-17 NOTE — Patient Instructions (Signed)
[facility] Discharge Instructions for Patients Receiving Chemotherapy  Today you received the following chemotherapy agents Velcade To help prevent nausea and vomiting after your treatment, we encourage you to take your nausea medication as prescribed.   If you develop nausea and vomiting that is not controlled by your nausea medication, call the clinic.   BELOW ARE SYMPTOMS THAT SHOULD BE REPORTED IMMEDIATELY:  *FEVER GREATER THAN 100.5 F  *CHILLS WITH OR WITHOUT FEVER  NAUSEA AND VOMITING THAT IS NOT CONTROLLED WITH YOUR NAUSEA MEDICATION  *UNUSUAL SHORTNESS OF BREATH  *UNUSUAL BRUISING OR BLEEDING  TENDERNESS IN MOUTH AND THROAT WITH OR WITHOUT PRESENCE OF ULCERS  *URINARY PROBLEMS  *BOWEL PROBLEMS  UNUSUAL RASH Items with * indicate a potential emergency and should be followed up as soon as possible.  Feel free to call the clinic should you have any questions or concerns. The clinic phone number is [phone].  Please show the CHEMO ALERT CARD at check-in to the Emergency Department and triage nurse.   

## 2017-06-30 ENCOUNTER — Other Ambulatory Visit: Payer: Self-pay | Admitting: *Deleted

## 2017-06-30 DIAGNOSIS — C9001 Multiple myeloma in remission: Secondary | ICD-10-CM

## 2017-07-01 ENCOUNTER — Ambulatory Visit: Payer: BLUE CROSS/BLUE SHIELD | Admitting: Hematology & Oncology

## 2017-07-01 ENCOUNTER — Other Ambulatory Visit: Payer: Self-pay

## 2017-07-01 ENCOUNTER — Inpatient Hospital Stay: Payer: BLUE CROSS/BLUE SHIELD

## 2017-07-01 VITALS — BP 131/81 | HR 53 | Temp 98.3°F | Resp 18

## 2017-07-01 DIAGNOSIS — Z9484 Stem cells transplant status: Secondary | ICD-10-CM | POA: Diagnosis not present

## 2017-07-01 DIAGNOSIS — C9001 Multiple myeloma in remission: Secondary | ICD-10-CM

## 2017-07-01 DIAGNOSIS — Z79899 Other long term (current) drug therapy: Secondary | ICD-10-CM | POA: Diagnosis not present

## 2017-07-01 LAB — CBC WITH DIFFERENTIAL (CANCER CENTER ONLY)
Basophils Absolute: 0 10*3/uL (ref 0.0–0.1)
Basophils Relative: 1 %
Eosinophils Absolute: 0 10*3/uL (ref 0.0–0.5)
Eosinophils Relative: 1 %
HCT: 37.2 % — ABNORMAL LOW (ref 38.7–49.9)
Hemoglobin: 13.6 g/dL (ref 13.0–17.1)
Lymphocytes Relative: 27 %
Lymphs Abs: 1.1 10*3/uL (ref 0.9–3.3)
MCH: 37.2 pg — ABNORMAL HIGH (ref 28.0–33.4)
MCHC: 36.6 g/dL — ABNORMAL HIGH (ref 32.0–35.9)
MCV: 101.6 fL — ABNORMAL HIGH (ref 82.0–98.0)
Monocytes Absolute: 0.4 10*3/uL (ref 0.1–0.9)
Monocytes Relative: 9 %
Neutro Abs: 2.6 10*3/uL (ref 1.5–6.5)
Neutrophils Relative %: 62 %
Platelet Count: 140 10*3/uL — ABNORMAL LOW (ref 145–400)
RBC: 3.66 MIL/uL — ABNORMAL LOW (ref 4.20–5.70)
RDW: 12.2 % (ref 11.1–15.7)
WBC Count: 4.1 10*3/uL (ref 4.0–10.0)

## 2017-07-01 LAB — CMP (CANCER CENTER ONLY)
ALT: 54 U/L — ABNORMAL HIGH (ref 10–47)
AST: 31 U/L (ref 11–38)
Albumin: 3.7 g/dL (ref 3.5–5.0)
Alkaline Phosphatase: 66 U/L (ref 26–84)
Anion gap: 5 (ref 5–15)
BUN: 16 mg/dL (ref 7–22)
CO2: 25 mmol/L (ref 18–33)
Calcium: 8.6 mg/dL (ref 8.0–10.3)
Chloride: 111 mmol/L — ABNORMAL HIGH (ref 98–108)
Creatinine: 1.2 mg/dL (ref 0.60–1.20)
Glucose, Bld: 91 mg/dL (ref 73–118)
Potassium: 4.4 mmol/L (ref 3.3–4.7)
Sodium: 141 mmol/L (ref 128–145)
Total Bilirubin: 0.8 mg/dL (ref 0.2–1.6)
Total Protein: 7 g/dL (ref 6.4–8.1)

## 2017-07-01 MED ORDER — BORTEZOMIB CHEMO SQ INJECTION 3.5 MG (2.5MG/ML)
3.0000 mg | Freq: Once | INTRAMUSCULAR | Status: AC
  Administered 2017-07-01: 3 mg via SUBCUTANEOUS
  Filled 2017-07-01: qty 3

## 2017-07-01 NOTE — Patient Instructions (Signed)
[facility] Discharge Instructions for Patients Receiving Chemotherapy  Today you received the following chemotherapy agents Velcade To help prevent nausea and vomiting after your treatment, we encourage you to take your nausea medication as prescribed.   If you develop nausea and vomiting that is not controlled by your nausea medication, call the clinic.   BELOW ARE SYMPTOMS THAT SHOULD BE REPORTED IMMEDIATELY:  *FEVER GREATER THAN 100.5 F  *CHILLS WITH OR WITHOUT FEVER  NAUSEA AND VOMITING THAT IS NOT CONTROLLED WITH YOUR NAUSEA MEDICATION  *UNUSUAL SHORTNESS OF BREATH  *UNUSUAL BRUISING OR BLEEDING  TENDERNESS IN MOUTH AND THROAT WITH OR WITHOUT PRESENCE OF ULCERS  *URINARY PROBLEMS  *BOWEL PROBLEMS  UNUSUAL RASH Items with * indicate a potential emergency and should be followed up as soon as possible.  Feel free to call the clinic should you have any questions or concerns. The clinic phone number is [phone].  Please show the CHEMO ALERT CARD at check-in to the Emergency Department and triage nurse.   

## 2017-07-12 ENCOUNTER — Other Ambulatory Visit: Payer: Self-pay | Admitting: *Deleted

## 2017-07-12 DIAGNOSIS — C9002 Multiple myeloma in relapse: Secondary | ICD-10-CM

## 2017-07-12 MED ORDER — LENALIDOMIDE 10 MG PO CAPS: ORAL_CAPSULE | ORAL | 0 refills | Status: DC

## 2017-07-15 ENCOUNTER — Other Ambulatory Visit: Payer: Self-pay

## 2017-07-15 ENCOUNTER — Inpatient Hospital Stay: Payer: BLUE CROSS/BLUE SHIELD

## 2017-07-15 ENCOUNTER — Encounter: Payer: Self-pay | Admitting: Hematology & Oncology

## 2017-07-15 ENCOUNTER — Inpatient Hospital Stay (HOSPITAL_BASED_OUTPATIENT_CLINIC_OR_DEPARTMENT_OTHER): Payer: BLUE CROSS/BLUE SHIELD | Admitting: Hematology & Oncology

## 2017-07-15 VITALS — BP 145/74 | HR 57 | Temp 97.6°F | Resp 16 | Wt 224.0 lb

## 2017-07-15 DIAGNOSIS — C9001 Multiple myeloma in remission: Secondary | ICD-10-CM

## 2017-07-15 DIAGNOSIS — Z79899 Other long term (current) drug therapy: Secondary | ICD-10-CM | POA: Diagnosis not present

## 2017-07-15 DIAGNOSIS — Z9484 Stem cells transplant status: Secondary | ICD-10-CM | POA: Diagnosis not present

## 2017-07-15 LAB — CBC WITH DIFFERENTIAL (CANCER CENTER ONLY)
Basophils Absolute: 0 10*3/uL (ref 0.0–0.1)
Basophils Relative: 0 %
Eosinophils Absolute: 0.1 10*3/uL (ref 0.0–0.5)
Eosinophils Relative: 5 %
HCT: 36.7 % — ABNORMAL LOW (ref 38.7–49.9)
Hemoglobin: 13.1 g/dL (ref 13.0–17.1)
Lymphocytes Relative: 26 %
Lymphs Abs: 0.7 10*3/uL — ABNORMAL LOW (ref 0.9–3.3)
MCH: 36.7 pg — ABNORMAL HIGH (ref 28.0–33.4)
MCHC: 35.7 g/dL (ref 32.0–35.9)
MCV: 102.8 fL — ABNORMAL HIGH (ref 82.0–98.0)
Monocytes Absolute: 0.3 10*3/uL (ref 0.1–0.9)
Monocytes Relative: 12 %
Neutro Abs: 1.6 10*3/uL (ref 1.5–6.5)
Neutrophils Relative %: 57 %
Platelet Count: 132 10*3/uL — ABNORMAL LOW (ref 145–400)
RBC: 3.57 MIL/uL — ABNORMAL LOW (ref 4.20–5.70)
RDW: 12.2 % (ref 11.1–15.7)
WBC Count: 2.8 10*3/uL — ABNORMAL LOW (ref 4.0–10.0)

## 2017-07-15 LAB — CMP (CANCER CENTER ONLY)
ALT: 57 U/L — ABNORMAL HIGH (ref 10–47)
AST: 34 U/L (ref 11–38)
Albumin: 3.7 g/dL (ref 3.5–5.0)
Alkaline Phosphatase: 64 U/L (ref 26–84)
Anion gap: 6 (ref 5–15)
BUN: 15 mg/dL (ref 7–22)
CO2: 27 mmol/L (ref 18–33)
Calcium: 8.8 mg/dL (ref 8.0–10.3)
Chloride: 107 mmol/L (ref 98–108)
Creatinine: 1.2 mg/dL (ref 0.60–1.20)
Glucose, Bld: 109 mg/dL (ref 73–118)
Potassium: 4.5 mmol/L (ref 3.3–4.7)
Sodium: 140 mmol/L (ref 128–145)
Total Bilirubin: 0.6 mg/dL (ref 0.2–1.6)
Total Protein: 7.1 g/dL (ref 6.4–8.1)

## 2017-07-15 MED ORDER — BORTEZOMIB CHEMO SQ INJECTION 3.5 MG (2.5MG/ML)
3.0000 mg | Freq: Once | INTRAMUSCULAR | Status: AC
  Administered 2017-07-15: 3 mg via SUBCUTANEOUS
  Filled 2017-07-15: qty 3

## 2017-07-15 NOTE — Progress Notes (Signed)
Hematology and Oncology Follow Up Visit  Tyler Phillips [id number] 12-17-1965 52 y.o. 07/15/2017   Principle Diagnosis:   IgG Kappa myeloma - +4, +14, +17  Current Therapy:  Status post autologous stem cell transplant on 05/22/2015   S/p Cycle #6 of RVD  Velcade q 2wk dosing/Revlimid 10mg  po q day (21/7) -      Interim History:  [name] is back for follow-up.  He is doing quite well.  Work is quite busy for him.  For last saw him in February, there is no monoclonal spike in his blood.  His IgG level was 1261 mg/dL.  His Kappa Lightchain was 2.8 mg/dL.  His appetite is good.  He has had no nausea or vomiting.  He has had no change in bowel or bladder habits.  He has had no leg swelling.  He has had no rashes with the Revlimid.   Overall, his performance status is ECOG 0.    Medications:  Current Outpatient Medications:  .  aspirin 325 MG tablet, Take 325 mg by mouth daily., Disp: , Rfl:  .  bortezomib IV (VELCADE) 3.5 MG injection, Inject as directed every 14 (fourteen) days., Disp: , Rfl:  .  cetirizine (ZYRTEC) 10 MG tablet, Take 10 mg by mouth daily., Disp: , Rfl:  .  Cholecalciferol (VITAMIN D3) 1000 units CAPS, Take by mouth., Disp: , Rfl:  .  clindamycin (CLEOCIN) 300 MG capsule, Take 600 mg by mouth See admin instructions. 1 hour prior to dental appts, Disp: , Rfl:  .  lenalidomide (REVLIMID) 10 MG capsule, Take 1 capsule daily for 21 days on and 7 days off. PJAS#[id number], Disp: 21 capsule, Rfl: 0 .  LORazepam (ATIVAN) 1 MG tablet, Take 1 mg by mouth every 4 (four) hours as needed., Disp: , Rfl:  .  oxyCODONE (OXY IR/ROXICODONE) 5 MG immediate release tablet, Take 5 mg by mouth every 4 (four) hours as needed., Disp: , Rfl:  .  penicillin v potassium (VEETID) 500 MG tablet, TAKE ONE TABLET (500 MG TOTAL) BY MOUTH EVERY 6 HOURS FOR 7 DAYS., Disp: , Rfl: 0 .  prochlorperazine (COMPAZINE) 10 MG tablet, Take 10 mg by mouth every 6 (six) hours as needed., Disp: , Rfl:  .   valACYclovir (VALTREX) 500 MG tablet, Take 500 mg by mouth 2 (two) times daily. , Disp: , Rfl:   Allergies: No Known Allergies  Past Medical History, Surgical history, Social history, and Family History were reviewed and updated.  Review of Systems: Review of Systems  Constitutional: Negative.   HENT: Negative.   Eyes: Negative.   Respiratory: Negative.   Cardiovascular: Negative.   Gastrointestinal: Negative.   Genitourinary: Negative.   Musculoskeletal: Negative.   Skin: Negative.   Neurological: Negative.   Endo/Heme/Allergies: Negative.   Psychiatric/Behavioral: Negative.      Physical Exam: Vital signs show a temperature of 97.6. Also 58. Blood pressure 139/86. Weight is 224 pounds.  Wt Readings from Last 3 Encounters:  07/15/17 224 lb (101.6 kg)  06/17/17 222 lb (100.7 kg)  06/03/17 224 lb 12.8 oz (102 kg)     Physical Exam  Constitutional: He is oriented to person, place, and time.  HENT:  Head: Normocephalic and atraumatic.  Mouth/Throat: Oropharynx is clear and moist.  Eyes: Pupils are equal, round, and reactive to light. EOM are normal.  Neck: Normal range of motion.  Cardiovascular: Normal rate, regular rhythm and normal heart sounds.  Pulmonary/Chest: Effort normal and breath sounds normal.  Abdominal: Soft.  Bowel sounds are normal.  Musculoskeletal: Normal range of motion. He exhibits no edema, tenderness or deformity.  Lymphadenopathy:    He has no cervical adenopathy.  Neurological: He is alert and oriented to person, place, and time.  Skin: Skin is warm and dry. No rash noted. No erythema.  Psychiatric: He has a normal mood and affect. His behavior is normal. Judgment and thought content normal.  Vitals reviewed.    Lab Results  Component Value Date   WBC 2.8 (L) 07/15/2017   HGB 13.5 04/22/2017   HCT 36.7 (L) 07/15/2017   MCV 102.8 (H) 07/15/2017   PLT 132 (L) 07/15/2017     Chemistry      Component Value Date/Time   NA 140 07/15/2017  0931   NA 140 04/22/2017 1140   NA 138 09/03/2015 1042   K 4.5 07/15/2017 0931   K 4.0 04/22/2017 1140   K 4.3 09/03/2015 1042   CL 107 07/15/2017 0931   CL 105 04/22/2017 1140   CO2 27 07/15/2017 0931   CO2 24 04/22/2017 1140   CO2 25 09/03/2015 1042   BUN 15 07/15/2017 0931   BUN 16 04/22/2017 1140   BUN 21.7 09/03/2015 1042   CREATININE 1.20 07/15/2017 0931   CREATININE 1.6 (H) 04/22/2017 1140   CREATININE 1.2 09/03/2015 1042      Component Value Date/Time   CALCIUM 8.8 07/15/2017 0931   CALCIUM 8.5 04/22/2017 1140   CALCIUM 9.1 09/03/2015 1042   ALKPHOS 64 07/15/2017 0931   ALKPHOS 59 04/22/2017 1140   ALKPHOS 42 09/03/2015 1042   AST 34 07/15/2017 0931   AST 27 09/03/2015 1042   ALT 57 (H) 07/15/2017 0931   ALT 45 04/22/2017 1140   ALT 41 09/03/2015 1042   BILITOT 0.6 07/15/2017 0931   BILITOT 0.54 09/03/2015 1042       Impression and Plan: [name] is 52 year old gentleman with IgG Kappa myeloma. We finally got him to transplant. This was truly a process with him. It took almost a year to get him to transplant.  He finally had his transplant in February 2017.  So far, everything still looks fantastic.  We will continue him on his schedule.  I will plan to get him back in 6 weeks to see [place].  He will continue to come back every 2 weeks for treatment.  I had an incredible time talking to he and his wife about his wife's upcoming trip to [place].  His wife and their daughter are going to [place] on a tour.  He cannot go because of their other child and work.  He just got back from [place].  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  + [name], MD 3/29/201911:38 AM

## 2017-07-15 NOTE — Patient Instructions (Signed)
[facility] Discharge Instructions for Patients Receiving Chemotherapy  Today you received the following chemotherapy agents Velcade  To help prevent nausea and vomiting after your treatment, we encourage you to take your nausea medication.   If you develop nausea and vomiting that is not controlled by your nausea medication, call the clinic.   BELOW ARE SYMPTOMS THAT SHOULD BE REPORTED IMMEDIATELY:  *FEVER GREATER THAN 100.5 F  *CHILLS WITH OR WITHOUT FEVER  NAUSEA AND VOMITING THAT IS NOT CONTROLLED WITH YOUR NAUSEA MEDICATION  *UNUSUAL SHORTNESS OF BREATH  *UNUSUAL BRUISING OR BLEEDING  TENDERNESS IN MOUTH AND THROAT WITH OR WITHOUT PRESENCE OF ULCERS  *URINARY PROBLEMS  *BOWEL PROBLEMS  UNUSUAL RASH Items with * indicate a potential emergency and should be followed up as soon as possible.  Feel free to call the clinic should you have any questions or concerns. The clinic phone number is [phone].  Please show the CHEMO ALERT CARD at check-in to the Emergency Department and triage nurse.    

## 2017-07-16 LAB — IGG, IGA, IGM
IgA: 130 mg/dL (ref 90–386)
IgG (Immunoglobin G), Serum: 1299 mg/dL (ref 700–1600)
IgM (Immunoglobulin M), Srm: 23 mg/dL (ref 20–172)

## 2017-07-18 LAB — KAPPA/LAMBDA LIGHT CHAINS
Kappa free light chain: 34.7 mg/L — ABNORMAL HIGH (ref 3.3–19.4)
Kappa, lambda light chain ratio: 1.71 — ABNORMAL HIGH (ref 0.26–1.65)
Lambda free light chains: 20.3 mg/L (ref 5.7–26.3)

## 2017-07-19 LAB — PROTEIN ELECTROPHORESIS, SERUM, WITH REFLEX
A/G Ratio: 1.1 (ref 0.7–1.7)
Albumin ELP: 3.5 g/dL (ref 2.9–4.4)
Alpha-1-Globulin: 0.2 g/dL (ref 0.0–0.4)
Alpha-2-Globulin: 0.6 g/dL (ref 0.4–1.0)
Beta Globulin: 0.9 g/dL (ref 0.7–1.3)
Gamma Globulin: 1.4 g/dL (ref 0.4–1.8)
Globulin, Total: 3.1 g/dL (ref 2.2–3.9)
Total Protein ELP: 6.6 g/dL (ref 6.0–8.5)

## 2017-07-28 ENCOUNTER — Other Ambulatory Visit: Payer: Self-pay | Admitting: Family

## 2017-07-28 DIAGNOSIS — C9001 Multiple myeloma in remission: Secondary | ICD-10-CM

## 2017-07-29 ENCOUNTER — Inpatient Hospital Stay: Payer: BLUE CROSS/BLUE SHIELD

## 2017-07-29 ENCOUNTER — Inpatient Hospital Stay: Payer: BLUE CROSS/BLUE SHIELD | Attending: Hematology & Oncology

## 2017-07-29 VITALS — BP 125/78 | HR 53 | Temp 97.6°F | Resp 17

## 2017-07-29 DIAGNOSIS — Z5112 Encounter for antineoplastic immunotherapy: Secondary | ICD-10-CM | POA: Insufficient documentation

## 2017-07-29 DIAGNOSIS — Z9484 Stem cells transplant status: Secondary | ICD-10-CM | POA: Insufficient documentation

## 2017-07-29 DIAGNOSIS — C9001 Multiple myeloma in remission: Secondary | ICD-10-CM

## 2017-07-29 DIAGNOSIS — Z79899 Other long term (current) drug therapy: Secondary | ICD-10-CM | POA: Insufficient documentation

## 2017-07-29 LAB — CBC WITH DIFFERENTIAL (CANCER CENTER ONLY)
Basophils Absolute: 0 10*3/uL (ref 0.0–0.1)
Basophils Relative: 1 %
Eosinophils Absolute: 0.1 10*3/uL (ref 0.0–0.5)
Eosinophils Relative: 2 %
HCT: 36.7 % — ABNORMAL LOW (ref 38.7–49.9)
Hemoglobin: 13.2 g/dL (ref 13.0–17.1)
Lymphocytes Relative: 32 %
Lymphs Abs: 0.9 10*3/uL (ref 0.9–3.3)
MCH: 36.7 pg — ABNORMAL HIGH (ref 28.0–33.4)
MCHC: 36 g/dL — ABNORMAL HIGH (ref 32.0–35.9)
MCV: 101.9 fL — ABNORMAL HIGH (ref 82.0–98.0)
Monocytes Absolute: 0.3 10*3/uL (ref 0.1–0.9)
Monocytes Relative: 10 %
Neutro Abs: 1.6 10*3/uL (ref 1.5–6.5)
Neutrophils Relative %: 55 %
Platelet Count: 156 10*3/uL (ref 145–400)
RBC: 3.6 MIL/uL — ABNORMAL LOW (ref 4.20–5.70)
RDW: 12.4 % (ref 11.1–15.7)
WBC Count: 3 10*3/uL — ABNORMAL LOW (ref 4.0–10.0)

## 2017-07-29 LAB — CMP (CANCER CENTER ONLY)
ALT: 53 U/L — ABNORMAL HIGH (ref 10–47)
AST: 32 U/L (ref 11–38)
Albumin: 3.8 g/dL (ref 3.5–5.0)
Alkaline Phosphatase: 56 U/L (ref 26–84)
Anion gap: 7 (ref 5–15)
BUN: 16 mg/dL (ref 7–22)
CO2: 29 mmol/L (ref 18–33)
Calcium: 9 mg/dL (ref 8.0–10.3)
Chloride: 107 mmol/L (ref 98–108)
Creatinine: 0.9 mg/dL (ref 0.60–1.20)
Glucose, Bld: 91 mg/dL (ref 73–118)
Potassium: 4.3 mmol/L (ref 3.3–4.7)
Sodium: 143 mmol/L (ref 128–145)
Total Bilirubin: 0.8 mg/dL (ref 0.2–1.6)
Total Protein: 7.1 g/dL (ref 6.4–8.1)

## 2017-07-29 MED ORDER — BORTEZOMIB CHEMO SQ INJECTION 3.5 MG (2.5MG/ML)
3.0000 mg | Freq: Once | INTRAMUSCULAR | Status: AC
  Administered 2017-07-29: 3 mg via SUBCUTANEOUS
  Filled 2017-07-29: qty 3

## 2017-08-11 ENCOUNTER — Other Ambulatory Visit: Payer: Self-pay | Admitting: *Deleted

## 2017-08-11 DIAGNOSIS — C9002 Multiple myeloma in relapse: Secondary | ICD-10-CM

## 2017-08-11 DIAGNOSIS — C9001 Multiple myeloma in remission: Secondary | ICD-10-CM

## 2017-08-11 MED ORDER — LENALIDOMIDE 10 MG PO CAPS: ORAL_CAPSULE | ORAL | 0 refills | Status: DC

## 2017-08-12 ENCOUNTER — Other Ambulatory Visit: Payer: Self-pay

## 2017-08-12 ENCOUNTER — Inpatient Hospital Stay: Payer: BLUE CROSS/BLUE SHIELD

## 2017-08-12 VITALS — BP 128/72 | HR 55 | Temp 98.1°F | Resp 18

## 2017-08-12 DIAGNOSIS — Z5112 Encounter for antineoplastic immunotherapy: Secondary | ICD-10-CM | POA: Diagnosis not present

## 2017-08-12 DIAGNOSIS — C9001 Multiple myeloma in remission: Secondary | ICD-10-CM | POA: Diagnosis not present

## 2017-08-12 DIAGNOSIS — Z9484 Stem cells transplant status: Secondary | ICD-10-CM | POA: Diagnosis not present

## 2017-08-12 DIAGNOSIS — Z79899 Other long term (current) drug therapy: Secondary | ICD-10-CM | POA: Diagnosis not present

## 2017-08-12 LAB — CMP (CANCER CENTER ONLY)
ALT: 55 U/L — ABNORMAL HIGH (ref 10–47)
AST: 31 U/L (ref 11–38)
Albumin: 3.9 g/dL (ref 3.5–5.0)
Alkaline Phosphatase: 56 U/L (ref 26–84)
Anion gap: 5 (ref 5–15)
BUN: 14 mg/dL (ref 7–22)
CO2: 26 mmol/L (ref 18–33)
Calcium: 8.9 mg/dL (ref 8.0–10.3)
Chloride: 109 mmol/L — ABNORMAL HIGH (ref 98–108)
Creatinine: 1.5 mg/dL — ABNORMAL HIGH (ref 0.60–1.20)
Glucose, Bld: 90 mg/dL (ref 73–118)
Potassium: 3.9 mmol/L (ref 3.3–4.7)
Sodium: 140 mmol/L (ref 128–145)
Total Bilirubin: 0.8 mg/dL (ref 0.2–1.6)
Total Protein: 7.2 g/dL (ref 6.4–8.1)

## 2017-08-12 LAB — CBC WITH DIFFERENTIAL (CANCER CENTER ONLY)
Basophils Absolute: 0 10*3/uL (ref 0.0–0.1)
Basophils Relative: 0 %
Eosinophils Absolute: 0.1 10*3/uL (ref 0.0–0.5)
Eosinophils Relative: 3 %
HCT: 36.9 % — ABNORMAL LOW (ref 38.7–49.9)
Hemoglobin: 13.4 g/dL (ref 13.0–17.1)
Lymphocytes Relative: 27 %
Lymphs Abs: 0.9 10*3/uL (ref 0.9–3.3)
MCH: 36.7 pg — ABNORMAL HIGH (ref 28.0–33.4)
MCHC: 36.3 g/dL — ABNORMAL HIGH (ref 32.0–35.9)
MCV: 101.1 fL — ABNORMAL HIGH (ref 82.0–98.0)
Monocytes Absolute: 0.3 10*3/uL (ref 0.1–0.9)
Monocytes Relative: 9 %
Neutro Abs: 2.2 10*3/uL (ref 1.5–6.5)
Neutrophils Relative %: 61 %
Platelet Count: 141 10*3/uL — ABNORMAL LOW (ref 145–400)
RBC: 3.65 MIL/uL — ABNORMAL LOW (ref 4.20–5.70)
RDW: 12.2 % (ref 11.1–15.7)
WBC Count: 3.6 10*3/uL — ABNORMAL LOW (ref 4.0–10.0)

## 2017-08-12 MED ORDER — BORTEZOMIB CHEMO SQ INJECTION 3.5 MG (2.5MG/ML)
3.0000 mg | Freq: Once | INTRAMUSCULAR | Status: AC
  Administered 2017-08-12: 3 mg via SUBCUTANEOUS
  Filled 2017-08-12: qty 3

## 2017-08-12 NOTE — Patient Instructions (Signed)

## 2017-08-26 ENCOUNTER — Other Ambulatory Visit: Payer: BLUE CROSS/BLUE SHIELD

## 2017-08-26 ENCOUNTER — Inpatient Hospital Stay: Payer: BLUE CROSS/BLUE SHIELD

## 2017-08-26 ENCOUNTER — Ambulatory Visit: Payer: BLUE CROSS/BLUE SHIELD | Admitting: Hematology & Oncology

## 2017-08-30 ENCOUNTER — Inpatient Hospital Stay: Payer: BLUE CROSS/BLUE SHIELD

## 2017-08-30 ENCOUNTER — Other Ambulatory Visit: Payer: Self-pay

## 2017-08-30 ENCOUNTER — Inpatient Hospital Stay (HOSPITAL_BASED_OUTPATIENT_CLINIC_OR_DEPARTMENT_OTHER): Payer: BLUE CROSS/BLUE SHIELD | Admitting: Hematology & Oncology

## 2017-08-30 ENCOUNTER — Inpatient Hospital Stay: Payer: BLUE CROSS/BLUE SHIELD | Attending: Hematology & Oncology

## 2017-08-30 VITALS — BP 122/76 | HR 56 | Temp 97.8°F | Resp 17 | Wt 218.0 lb

## 2017-08-30 DIAGNOSIS — C9001 Multiple myeloma in remission: Secondary | ICD-10-CM

## 2017-08-30 DIAGNOSIS — Z9484 Stem cells transplant status: Secondary | ICD-10-CM | POA: Diagnosis not present

## 2017-08-30 DIAGNOSIS — Z79899 Other long term (current) drug therapy: Secondary | ICD-10-CM | POA: Diagnosis not present

## 2017-08-30 DIAGNOSIS — Z5112 Encounter for antineoplastic immunotherapy: Secondary | ICD-10-CM | POA: Diagnosis not present

## 2017-08-30 LAB — CBC WITH DIFFERENTIAL (CANCER CENTER ONLY)
Basophils Absolute: 0 10*3/uL (ref 0.0–0.1)
Basophils Relative: 1 %
Eosinophils Absolute: 0.1 10*3/uL (ref 0.0–0.5)
Eosinophils Relative: 3 %
HCT: 37.2 % — ABNORMAL LOW (ref 38.7–49.9)
Hemoglobin: 13.4 g/dL (ref 13.0–17.1)
Lymphocytes Relative: 29 %
Lymphs Abs: 0.8 10*3/uL — ABNORMAL LOW (ref 0.9–3.3)
MCH: 36.6 pg — ABNORMAL HIGH (ref 28.0–33.4)
MCHC: 36 g/dL — ABNORMAL HIGH (ref 32.0–35.9)
MCV: 101.6 fL — ABNORMAL HIGH (ref 82.0–98.0)
Monocytes Absolute: 0.3 10*3/uL (ref 0.1–0.9)
Monocytes Relative: 11 %
Neutro Abs: 1.5 10*3/uL (ref 1.5–6.5)
Neutrophils Relative %: 56 %
Platelet Count: 145 10*3/uL (ref 145–400)
RBC: 3.66 MIL/uL — ABNORMAL LOW (ref 4.20–5.70)
RDW: 12.7 % (ref 11.1–15.7)
WBC Count: 2.6 10*3/uL — ABNORMAL LOW (ref 4.0–10.0)

## 2017-08-30 LAB — CMP (CANCER CENTER ONLY)
ALT: 53 U/L — ABNORMAL HIGH (ref 10–47)
AST: 33 U/L (ref 11–38)
Albumin: 3.7 g/dL (ref 3.5–5.0)
Alkaline Phosphatase: 59 U/L (ref 26–84)
Anion gap: 8 (ref 5–15)
BUN: 20 mg/dL (ref 7–22)
CO2: 27 mmol/L (ref 18–33)
Calcium: 8.8 mg/dL (ref 8.0–10.3)
Chloride: 107 mmol/L (ref 98–108)
Creatinine: 1.4 mg/dL — ABNORMAL HIGH (ref 0.60–1.20)
Glucose, Bld: 95 mg/dL (ref 73–118)
Potassium: 4.1 mmol/L (ref 3.3–4.7)
Sodium: 142 mmol/L (ref 128–145)
Total Bilirubin: 0.9 mg/dL (ref 0.2–1.6)
Total Protein: 7 g/dL (ref 6.4–8.1)

## 2017-08-30 MED ORDER — BORTEZOMIB CHEMO SQ INJECTION 3.5 MG (2.5MG/ML)
3.0000 mg | Freq: Once | INTRAMUSCULAR | Status: AC
  Administered 2017-08-30: 3 mg via SUBCUTANEOUS
  Filled 2017-08-30: qty 3

## 2017-08-30 NOTE — Progress Notes (Signed)
Hematology and Oncology Follow Up Visit  Tyler Phillips [id number] 01-13-1966 52 y.o. 08/30/2017   Principle Diagnosis:   IgG Kappa myeloma - +4, +14, +17  Current Therapy:  Status post autologous stem cell transplant on 05/22/2015   S/p Cycle #6 of RVD  Velcade q 2wk dosing/Revlimid 10mg  po q day (21/7) -      Interim History:  [name] is back for follow-up.  He is doing quite well.  Work is quite busy for him.  He actually just got back from [place].  He was there for a week on business.    More important is the fact that his wife just got back from [place].  She was over there with 1 of their children.  She had a fantastic time.  She really enjoyed, all the country that she went to.  She brought me back some stamps from [place].  The last saw him in April, there is no monoclonal spike in his blood.  His IgG level was 1300 mg/dL.  His Kappa Lightchain was 3.5 mg/dL.  His appetite is good.  He has had no nausea or vomiting.  He has had no change in bowel or bladder habits.  He has had no leg swelling.  He has had no rashes with the Revlimid.   Overall, his performance status is ECOG 0.    Medications:  Current Outpatient Medications:  .  aspirin 325 MG tablet, Take 325 mg by mouth daily., Disp: , Rfl:  .  bortezomib IV (VELCADE) 3.5 MG injection, Inject as directed every 14 (fourteen) days., Disp: , Rfl:  .  cetirizine (ZYRTEC) 10 MG tablet, Take 10 mg by mouth daily., Disp: , Rfl:  .  Cholecalciferol (VITAMIN D3) 1000 units CAPS, Take by mouth., Disp: , Rfl:  .  clindamycin (CLEOCIN) 300 MG capsule, Take 600 mg by mouth See admin instructions. 1 hour prior to dental appts, Disp: , Rfl:  .  lenalidomide (REVLIMID) 10 MG capsule, Take 1 capsule daily for 21 days on and 7 days off. [id number], Disp: 21 capsule, Rfl: 0 .  LORazepam (ATIVAN) 1 MG tablet, Take 1 mg by mouth every 4 (four) hours as needed., Disp: , Rfl:  .  oxyCODONE (OXY IR/ROXICODONE) 5 MG immediate release  tablet, Take 5 mg by mouth every 4 (four) hours as needed., Disp: , Rfl:  .  penicillin v potassium (VEETID) 500 MG tablet, TAKE ONE TABLET (500 MG TOTAL) BY MOUTH EVERY 6 HOURS FOR 7 DAYS., Disp: , Rfl: 0 .  prochlorperazine (COMPAZINE) 10 MG tablet, Take 10 mg by mouth every 6 (six) hours as needed., Disp: , Rfl:  .  valACYclovir (VALTREX) 500 MG tablet, Take 500 mg by mouth 2 (two) times daily. , Disp: , Rfl:   Allergies: No Known Allergies  Past Medical History, Surgical history, Social history, and Family History were reviewed and updated.  Review of Systems: Review of Systems  Constitutional: Negative.   HENT: Negative.   Eyes: Negative.   Respiratory: Negative.   Cardiovascular: Negative.   Gastrointestinal: Negative.   Genitourinary: Negative.   Musculoskeletal: Negative.   Skin: Negative.   Neurological: Negative.   Endo/Heme/Allergies: Negative.   Psychiatric/Behavioral: Negative.      Physical Exam: Vital signs show a temperature of 97.6. Also 58. Blood pressure 139/86. Weight is 224 pounds.  Wt Readings from Last 3 Encounters:  08/30/17 218 lb (98.9 kg)  07/15/17 224 lb (101.6 kg)  06/17/17 222 lb (100.7 kg)  Physical Exam  Constitutional: He is oriented to person, place, and time.  HENT:  Head: Normocephalic and atraumatic.  Mouth/Throat: Oropharynx is clear and moist.  Eyes: Pupils are equal, round, and reactive to light. EOM are normal.  Neck: Normal range of motion.  Cardiovascular: Normal rate, regular rhythm and normal heart sounds.  Pulmonary/Chest: Effort normal and breath sounds normal.  Abdominal: Soft. Bowel sounds are normal.  Musculoskeletal: Normal range of motion. He exhibits no edema, tenderness or deformity.  Lymphadenopathy:    He has no cervical adenopathy.  Neurological: He is alert and oriented to person, place, and time.  Skin: Skin is warm and dry. No rash noted. No erythema.  Psychiatric: He has a normal mood and affect. His  behavior is normal. Judgment and thought content normal.  Vitals reviewed.    Lab Results  Component Value Date   WBC 2.6 (L) 08/30/2017   HGB 13.4 08/30/2017   HCT 37.2 (L) 08/30/2017   MCV 101.6 (H) 08/30/2017   PLT 145 08/30/2017     Chemistry      Component Value Date/Time   NA 142 08/30/2017 1006   NA 140 04/22/2017 1140   NA 138 09/03/2015 1042   K 4.1 08/30/2017 1006   K 4.0 04/22/2017 1140   K 4.3 09/03/2015 1042   CL 107 08/30/2017 1006   CL 105 04/22/2017 1140   CO2 27 08/30/2017 1006   CO2 24 04/22/2017 1140   CO2 25 09/03/2015 1042   BUN 20 08/30/2017 1006   BUN 16 04/22/2017 1140   BUN 21.7 09/03/2015 1042   CREATININE 1.40 (H) 08/30/2017 1006   CREATININE 1.6 (H) 04/22/2017 1140   CREATININE 1.2 09/03/2015 1042      Component Value Date/Time   CALCIUM 8.8 08/30/2017 1006   CALCIUM 8.5 04/22/2017 1140   CALCIUM 9.1 09/03/2015 1042   ALKPHOS 59 08/30/2017 1006   ALKPHOS 59 04/22/2017 1140   ALKPHOS 42 09/03/2015 1042   AST 33 08/30/2017 1006   AST 27 09/03/2015 1042   ALT 53 (H) 08/30/2017 1006   ALT 45 04/22/2017 1140   ALT 41 09/03/2015 1042   BILITOT 0.9 08/30/2017 1006   BILITOT 0.54 09/03/2015 1042       Impression and Plan: [name] is 52 year old gentleman with IgG Kappa myeloma. We finally got him to transplant. This was truly a process with him. It took almost a year to get him to transplant.  He finally had his transplant in February 2017.  So far, everything still looks fantastic.  We will continue him on his schedule.  I will plan to get him back in 6 weeks to see [place].  He will continue to come back every 2 weeks for treatment.   [name], MD 5/14/201911:17 AM

## 2017-08-30 NOTE — Patient Instructions (Signed)
[facility] Discharge Instructions for Patients Receiving Chemotherapy  Today you received the following chemotherapy agents:  Velcade  To help prevent nausea and vomiting after your treatment, we encourage you to take your nausea medication as ordered per MD.    If you develop nausea and vomiting that is not controlled by your nausea medication, call the clinic.   BELOW ARE SYMPTOMS THAT SHOULD BE REPORTED IMMEDIATELY:  *FEVER GREATER THAN 100.5 F  *CHILLS WITH OR WITHOUT FEVER  NAUSEA AND VOMITING THAT IS NOT CONTROLLED WITH YOUR NAUSEA MEDICATION  *UNUSUAL SHORTNESS OF BREATH  *UNUSUAL BRUISING OR BLEEDING  TENDERNESS IN MOUTH AND THROAT WITH OR WITHOUT PRESENCE OF ULCERS  *URINARY PROBLEMS  *BOWEL PROBLEMS  UNUSUAL RASH Items with * indicate a potential emergency and should be followed up as soon as possible.  Feel free to call the clinic should you have any questions or concerns. The clinic phone number is [phone].  Please show the CHEMO ALERT CARD at check-in to the Emergency Department and triage nurse.   

## 2017-08-31 LAB — IGG, IGA, IGM
IgA: 136 mg/dL (ref 90–386)
IgG (Immunoglobin G), Serum: 1279 mg/dL (ref 700–1600)
IgM (Immunoglobulin M), Srm: 19 mg/dL — ABNORMAL LOW (ref 20–172)

## 2017-08-31 LAB — KAPPA/LAMBDA LIGHT CHAINS
Kappa free light chain: 31.5 mg/L — ABNORMAL HIGH (ref 3.3–19.4)
Kappa, lambda light chain ratio: 1.89 — ABNORMAL HIGH (ref 0.26–1.65)
Lambda free light chains: 16.7 mg/L (ref 5.7–26.3)

## 2017-09-01 LAB — PROTEIN ELECTROPHORESIS, SERUM, WITH REFLEX
A/G Ratio: 1.4 (ref 0.7–1.7)
Albumin ELP: 4 g/dL (ref 2.9–4.4)
Alpha-1-Globulin: 0.2 g/dL (ref 0.0–0.4)
Alpha-2-Globulin: 0.6 g/dL (ref 0.4–1.0)
Beta Globulin: 0.8 g/dL (ref 0.7–1.3)
Gamma Globulin: 1.2 g/dL (ref 0.4–1.8)
Globulin, Total: 2.8 g/dL (ref 2.2–3.9)
Total Protein ELP: 6.8 g/dL (ref 6.0–8.5)

## 2017-09-07 ENCOUNTER — Other Ambulatory Visit: Payer: Self-pay | Admitting: *Deleted

## 2017-09-07 DIAGNOSIS — C9002 Multiple myeloma in relapse: Secondary | ICD-10-CM

## 2017-09-07 MED ORDER — LENALIDOMIDE 10 MG PO CAPS: ORAL_CAPSULE | ORAL | 0 refills | Status: DC

## 2017-09-08 ENCOUNTER — Telehealth: Payer: Self-pay | Admitting: Hematology & Oncology

## 2017-09-08 ENCOUNTER — Other Ambulatory Visit: Payer: Self-pay | Admitting: *Deleted

## 2017-09-08 DIAGNOSIS — C9002 Multiple myeloma in relapse: Secondary | ICD-10-CM

## 2017-09-08 MED ORDER — LENALIDOMIDE 10 MG PO CAPS: ORAL_CAPSULE | ORAL | 0 refills | Status: DC

## 2017-09-08 NOTE — Telephone Encounter (Signed)
Received call form patient to reschedule 5/24 appts due to going out of town. Rescheduled appts to 5/31 per patient preference and adjusted remaning appts

## 2017-09-09 ENCOUNTER — Inpatient Hospital Stay: Payer: BLUE CROSS/BLUE SHIELD

## 2017-09-15 ENCOUNTER — Other Ambulatory Visit: Payer: Self-pay | Admitting: *Deleted

## 2017-09-15 DIAGNOSIS — C9001 Multiple myeloma in remission: Secondary | ICD-10-CM

## 2017-09-16 ENCOUNTER — Other Ambulatory Visit: Payer: Self-pay

## 2017-09-16 ENCOUNTER — Inpatient Hospital Stay: Payer: BLUE CROSS/BLUE SHIELD

## 2017-09-16 VITALS — BP 130/82 | HR 61 | Temp 97.9°F | Resp 16

## 2017-09-16 DIAGNOSIS — Z5112 Encounter for antineoplastic immunotherapy: Secondary | ICD-10-CM | POA: Diagnosis not present

## 2017-09-16 DIAGNOSIS — C9001 Multiple myeloma in remission: Secondary | ICD-10-CM

## 2017-09-16 DIAGNOSIS — Z9484 Stem cells transplant status: Secondary | ICD-10-CM | POA: Diagnosis not present

## 2017-09-16 DIAGNOSIS — Z79899 Other long term (current) drug therapy: Secondary | ICD-10-CM | POA: Diagnosis not present

## 2017-09-16 LAB — CMP (CANCER CENTER ONLY)
ALT: 64 U/L — ABNORMAL HIGH (ref 10–47)
AST: 37 U/L (ref 11–38)
Albumin: 3.8 g/dL (ref 3.5–5.0)
Alkaline Phosphatase: 64 U/L (ref 26–84)
Anion gap: 5 (ref 5–15)
BUN: 13 mg/dL (ref 7–22)
CO2: 26 mmol/L (ref 18–33)
Calcium: 9 mg/dL (ref 8.0–10.3)
Chloride: 109 mmol/L — ABNORMAL HIGH (ref 98–108)
Creatinine: 1.2 mg/dL (ref 0.60–1.20)
Glucose, Bld: 89 mg/dL (ref 73–118)
Potassium: 4.1 mmol/L (ref 3.3–4.7)
Sodium: 140 mmol/L (ref 128–145)
Total Bilirubin: 0.9 mg/dL (ref 0.2–1.6)
Total Protein: 7 g/dL (ref 6.4–8.1)

## 2017-09-16 LAB — CBC WITH DIFFERENTIAL (CANCER CENTER ONLY)
Basophils Absolute: 0 10*3/uL (ref 0.0–0.1)
Basophils Relative: 0 %
Eosinophils Absolute: 0.2 10*3/uL (ref 0.0–0.5)
Eosinophils Relative: 5 %
HCT: 36.9 % — ABNORMAL LOW (ref 38.7–49.9)
Hemoglobin: 13.4 g/dL (ref 13.0–17.1)
Lymphocytes Relative: 21 %
Lymphs Abs: 0.7 10*3/uL — ABNORMAL LOW (ref 0.9–3.3)
MCH: 36.7 pg — ABNORMAL HIGH (ref 28.0–33.4)
MCHC: 36.3 g/dL — ABNORMAL HIGH (ref 32.0–35.9)
MCV: 101.1 fL — ABNORMAL HIGH (ref 82.0–98.0)
Monocytes Absolute: 0.4 10*3/uL (ref 0.1–0.9)
Monocytes Relative: 10 %
Neutro Abs: 2.3 10*3/uL (ref 1.5–6.5)
Neutrophils Relative %: 64 %
Platelet Count: 126 10*3/uL — ABNORMAL LOW (ref 145–400)
RBC: 3.65 MIL/uL — ABNORMAL LOW (ref 4.20–5.70)
RDW: 12.9 % (ref 11.1–15.7)
WBC Count: 3.5 10*3/uL — ABNORMAL LOW (ref 4.0–10.0)

## 2017-09-16 MED ORDER — BORTEZOMIB CHEMO SQ INJECTION 3.5 MG (2.5MG/ML)
3.0000 mg | Freq: Once | INTRAMUSCULAR | Status: AC
  Administered 2017-09-16: 3 mg via SUBCUTANEOUS
  Filled 2017-09-16: qty 3

## 2017-09-16 NOTE — Patient Instructions (Signed)
[facility] Discharge Instructions for Patients Receiving Chemotherapy  Today you received the following chemotherapy agents Velcade To help prevent nausea and vomiting after your treatment, we encourage you to take your nausea medication as prescribed.   If you develop nausea and vomiting that is not controlled by your nausea medication, call the clinic.   BELOW ARE SYMPTOMS THAT SHOULD BE REPORTED IMMEDIATELY:  *FEVER GREATER THAN 100.5 F  *CHILLS WITH OR WITHOUT FEVER  NAUSEA AND VOMITING THAT IS NOT CONTROLLED WITH YOUR NAUSEA MEDICATION  *UNUSUAL SHORTNESS OF BREATH  *UNUSUAL BRUISING OR BLEEDING  TENDERNESS IN MOUTH AND THROAT WITH OR WITHOUT PRESENCE OF ULCERS  *URINARY PROBLEMS  *BOWEL PROBLEMS  UNUSUAL RASH Items with * indicate a potential emergency and should be followed up as soon as possible.  Feel free to call the clinic should you have any questions or concerns. The clinic phone number is [phone].  Please show the CHEMO ALERT CARD at check-in to the Emergency Department and triage nurse.   

## 2017-09-27 ENCOUNTER — Other Ambulatory Visit: Payer: BLUE CROSS/BLUE SHIELD

## 2017-09-27 ENCOUNTER — Inpatient Hospital Stay: Payer: BLUE CROSS/BLUE SHIELD

## 2017-10-05 ENCOUNTER — Other Ambulatory Visit: Payer: Self-pay | Admitting: *Deleted

## 2017-10-05 DIAGNOSIS — C9002 Multiple myeloma in relapse: Secondary | ICD-10-CM

## 2017-10-05 MED ORDER — LENALIDOMIDE 10 MG PO CAPS: ORAL_CAPSULE | ORAL | 0 refills | Status: DC

## 2017-10-06 ENCOUNTER — Other Ambulatory Visit: Payer: Self-pay | Admitting: *Deleted

## 2017-10-07 ENCOUNTER — Inpatient Hospital Stay: Payer: BLUE CROSS/BLUE SHIELD

## 2017-10-07 ENCOUNTER — Inpatient Hospital Stay: Payer: BLUE CROSS/BLUE SHIELD | Attending: Hematology & Oncology

## 2017-10-07 VITALS — BP 141/75 | HR 52 | Temp 98.0°F | Resp 16

## 2017-10-07 DIAGNOSIS — Z5112 Encounter for antineoplastic immunotherapy: Secondary | ICD-10-CM | POA: Insufficient documentation

## 2017-10-07 DIAGNOSIS — C9001 Multiple myeloma in remission: Secondary | ICD-10-CM | POA: Diagnosis not present

## 2017-10-07 LAB — CMP (CANCER CENTER ONLY)
ALT: 64 U/L — ABNORMAL HIGH (ref 10–47)
AST: 46 U/L — ABNORMAL HIGH (ref 11–38)
Albumin: 3.7 g/dL (ref 3.5–5.0)
Alkaline Phosphatase: 58 U/L (ref 26–84)
Anion gap: 11 (ref 5–15)
BUN: 16 mg/dL (ref 7–22)
CO2: 26 mmol/L (ref 18–33)
Calcium: 8.8 mg/dL (ref 8.0–10.3)
Chloride: 106 mmol/L (ref 98–108)
Creatinine: 1.6 mg/dL — ABNORMAL HIGH (ref 0.60–1.20)
Glucose, Bld: 106 mg/dL (ref 73–118)
Potassium: 3.9 mmol/L (ref 3.3–4.7)
Sodium: 143 mmol/L (ref 128–145)
Total Bilirubin: 1 mg/dL (ref 0.2–1.6)
Total Protein: 7.1 g/dL (ref 6.4–8.1)

## 2017-10-07 LAB — CBC WITH DIFFERENTIAL (CANCER CENTER ONLY)
Basophils Absolute: 0 10*3/uL (ref 0.0–0.1)
Basophils Relative: 0 %
Eosinophils Absolute: 0.1 10*3/uL (ref 0.0–0.5)
Eosinophils Relative: 3 %
HCT: 37.5 % — ABNORMAL LOW (ref 38.7–49.9)
Hemoglobin: 13.3 g/dL (ref 13.0–17.1)
Lymphocytes Relative: 27 %
Lymphs Abs: 0.8 10*3/uL — ABNORMAL LOW (ref 0.9–3.3)
MCH: 36 pg — ABNORMAL HIGH (ref 28.0–33.4)
MCHC: 35.5 g/dL (ref 32.0–35.9)
MCV: 101.6 fL — ABNORMAL HIGH (ref 82.0–98.0)
Monocytes Absolute: 0.3 10*3/uL (ref 0.1–0.9)
Monocytes Relative: 11 %
Neutro Abs: 1.8 10*3/uL (ref 1.5–6.5)
Neutrophils Relative %: 59 %
Platelet Count: 126 10*3/uL — ABNORMAL LOW (ref 145–400)
RBC: 3.69 MIL/uL — ABNORMAL LOW (ref 4.20–5.70)
RDW: 13 % (ref 11.1–15.7)
WBC Count: 3 10*3/uL — ABNORMAL LOW (ref 4.0–10.0)

## 2017-10-07 MED ORDER — BORTEZOMIB CHEMO SQ INJECTION 3.5 MG (2.5MG/ML)
3.0000 mg | Freq: Once | INTRAMUSCULAR | Status: AC
  Administered 2017-10-07: 3 mg via SUBCUTANEOUS
  Filled 2017-10-07: qty 1.2

## 2017-10-07 NOTE — Progress Notes (Signed)
Reviewed labs with Dr. Ennever.  Ok to treat 

## 2017-10-07 NOTE — Patient Instructions (Signed)

## 2017-10-08 LAB — IGG, IGA, IGM
IgA: 148 mg/dL (ref 90–386)
IgG (Immunoglobin G), Serum: 1298 mg/dL (ref 700–1600)
IgM (Immunoglobulin M), Srm: 21 mg/dL (ref 20–172)

## 2017-10-09 LAB — PROTEIN ELECTROPHORESIS, SERUM, WITH REFLEX
A/G Ratio: 1.5 (ref 0.7–1.7)
Albumin ELP: 4.1 g/dL (ref 2.9–4.4)
Alpha-1-Globulin: 0.1 g/dL (ref 0.0–0.4)
Alpha-2-Globulin: 0.5 g/dL (ref 0.4–1.0)
Beta Globulin: 0.8 g/dL (ref 0.7–1.3)
Gamma Globulin: 1.2 g/dL (ref 0.4–1.8)
Globulin, Total: 2.7 g/dL (ref 2.2–3.9)
Total Protein ELP: 6.8 g/dL (ref 6.0–8.5)

## 2017-10-10 LAB — KAPPA/LAMBDA LIGHT CHAINS
Kappa free light chain: 37.1 mg/L — ABNORMAL HIGH (ref 3.3–19.4)
Kappa, lambda light chain ratio: 1.93 — ABNORMAL HIGH (ref 0.26–1.65)
Lambda free light chains: 19.2 mg/L (ref 5.7–26.3)

## 2017-10-11 ENCOUNTER — Inpatient Hospital Stay: Payer: BLUE CROSS/BLUE SHIELD

## 2017-10-11 ENCOUNTER — Ambulatory Visit: Payer: BLUE CROSS/BLUE SHIELD | Admitting: Hematology & Oncology

## 2017-10-11 ENCOUNTER — Other Ambulatory Visit: Payer: BLUE CROSS/BLUE SHIELD

## 2017-10-27 ENCOUNTER — Other Ambulatory Visit: Payer: Self-pay | Admitting: *Deleted

## 2017-10-27 DIAGNOSIS — C9001 Multiple myeloma in remission: Secondary | ICD-10-CM

## 2017-10-28 ENCOUNTER — Inpatient Hospital Stay: Payer: BLUE CROSS/BLUE SHIELD | Attending: Hematology & Oncology

## 2017-10-28 ENCOUNTER — Inpatient Hospital Stay: Payer: BLUE CROSS/BLUE SHIELD

## 2017-10-28 ENCOUNTER — Other Ambulatory Visit: Payer: Self-pay

## 2017-10-28 ENCOUNTER — Inpatient Hospital Stay (HOSPITAL_BASED_OUTPATIENT_CLINIC_OR_DEPARTMENT_OTHER): Payer: BLUE CROSS/BLUE SHIELD | Admitting: Hematology & Oncology

## 2017-10-28 ENCOUNTER — Other Ambulatory Visit: Payer: Self-pay | Admitting: *Deleted

## 2017-10-28 ENCOUNTER — Encounter: Payer: Self-pay | Admitting: Hematology & Oncology

## 2017-10-28 VITALS — BP 133/78 | HR 62 | Temp 97.9°F | Resp 18 | Wt 218.0 lb

## 2017-10-28 DIAGNOSIS — Z7982 Long term (current) use of aspirin: Secondary | ICD-10-CM | POA: Diagnosis not present

## 2017-10-28 DIAGNOSIS — C9001 Multiple myeloma in remission: Secondary | ICD-10-CM

## 2017-10-28 DIAGNOSIS — Z9484 Stem cells transplant status: Secondary | ICD-10-CM | POA: Diagnosis not present

## 2017-10-28 DIAGNOSIS — C9002 Multiple myeloma in relapse: Secondary | ICD-10-CM

## 2017-10-28 DIAGNOSIS — Z79899 Other long term (current) drug therapy: Secondary | ICD-10-CM

## 2017-10-28 DIAGNOSIS — Z5111 Encounter for antineoplastic chemotherapy: Secondary | ICD-10-CM | POA: Diagnosis not present

## 2017-10-28 LAB — CBC WITH DIFFERENTIAL (CANCER CENTER ONLY)
Basophils Absolute: 0 10*3/uL (ref 0.0–0.1)
Basophils Relative: 0 %
Eosinophils Absolute: 0.1 10*3/uL (ref 0.0–0.5)
Eosinophils Relative: 2 %
HCT: 36.9 % — ABNORMAL LOW (ref 38.7–49.9)
Hemoglobin: 13.1 g/dL (ref 13.0–17.1)
Lymphocytes Relative: 29 %
Lymphs Abs: 0.9 10*3/uL (ref 0.9–3.3)
MCH: 36.1 pg — ABNORMAL HIGH (ref 28.0–33.4)
MCHC: 35.5 g/dL (ref 32.0–35.9)
MCV: 101.7 fL — ABNORMAL HIGH (ref 82.0–98.0)
Monocytes Absolute: 0.2 10*3/uL (ref 0.1–0.9)
Monocytes Relative: 5 %
Neutro Abs: 1.9 10*3/uL (ref 1.5–6.5)
Neutrophils Relative %: 64 %
Platelet Count: 151 10*3/uL (ref 145–400)
RBC: 3.63 MIL/uL — ABNORMAL LOW (ref 4.20–5.70)
RDW: 12.6 % (ref 11.1–15.7)
WBC Count: 3 10*3/uL — ABNORMAL LOW (ref 4.0–10.0)

## 2017-10-28 LAB — CMP (CANCER CENTER ONLY)
ALT: 72 U/L — ABNORMAL HIGH (ref 10–47)
AST: 36 U/L (ref 11–38)
Albumin: 3.7 g/dL (ref 3.5–5.0)
Alkaline Phosphatase: 67 U/L (ref 26–84)
Anion gap: 6 (ref 5–15)
BUN: 15 mg/dL (ref 7–22)
CO2: 30 mmol/L (ref 18–33)
Calcium: 8.9 mg/dL (ref 8.0–10.3)
Chloride: 106 mmol/L (ref 98–108)
Creatinine: 1.2 mg/dL (ref 0.60–1.20)
Glucose, Bld: 81 mg/dL (ref 73–118)
Potassium: 4.6 mmol/L (ref 3.3–4.7)
Sodium: 142 mmol/L (ref 128–145)
Total Bilirubin: 0.8 mg/dL (ref 0.2–1.6)
Total Protein: 7.1 g/dL (ref 6.4–8.1)

## 2017-10-28 MED ORDER — BORTEZOMIB CHEMO SQ INJECTION 3.5 MG (2.5MG/ML)
3.0000 mg | Freq: Once | INTRAMUSCULAR | Status: AC
  Administered 2017-10-28: 3 mg via SUBCUTANEOUS
  Filled 2017-10-28: qty 1.2

## 2017-10-28 MED ORDER — LENALIDOMIDE 10 MG PO CAPS: ORAL_CAPSULE | ORAL | 0 refills | Status: DC

## 2017-10-28 NOTE — Progress Notes (Signed)
Hematology and Oncology Follow Up Visit  Tyler Phillips [id number] May 01, 1965 52 y.o. 10/28/2017   Principle Diagnosis:   IgG Kappa myeloma - +4, +14, +17  Current Therapy:  Status post autologous stem cell transplant on 05/22/2015   S/p Cycle #6 of RVD  Velcade q 2wk dosing/Revlimid 10mg  po q day (21/7) -      Interim History:  [name] is back for follow-up.  Everything is going well.  He has had no issues with respect to the treatments.  He says that he does have some loose stool.  There might be some actual diarrhea.  He is not doing anything for this right now.  I told him that Imodium certainly would be a good idea.  As far as his myeloma is concerned, he is still in remission.  He has no M spike on his myeloma panel.  He is working without difficulty.  He has been traveling a little bit.  Their children have been off to different camps.  There is son was actually in [place].  He is now back in the [place].  His appetite is doing well.  He has had no nausea or vomiting.  There is been no rashes.  He is had no pruritus.  He has had no leg swelling.  Overall, his performance status is ECOG 0.    Medications:  Current Outpatient Medications:  .  aspirin 325 MG tablet, Take 325 mg by mouth daily., Disp: , Rfl:  .  bortezomib IV (VELCADE) 3.5 MG injection, Inject as directed every 14 (fourteen) days., Disp: , Rfl:  .  cetirizine (ZYRTEC) 10 MG tablet, Take 10 mg by mouth daily., Disp: , Rfl:  .  Cholecalciferol (VITAMIN D3) 1000 units CAPS, Take by mouth., Disp: , Rfl:  .  clindamycin (CLEOCIN) 300 MG capsule, Take 600 mg by mouth See admin instructions. 1 hour prior to dental appts, Disp: , Rfl:  .  lenalidomide (REVLIMID) 10 MG capsule, Take 1 capsule daily for 21 days on and 7 days off. [id number], Disp: 21 capsule, Rfl: 0 .  LORazepam (ATIVAN) 1 MG tablet, Take 1 mg by mouth every 4 (four) hours as needed., Disp: , Rfl:  .  oxyCODONE (OXY IR/ROXICODONE) 5 MG  immediate release tablet, Take 5 mg by mouth every 4 (four) hours as needed., Disp: , Rfl:  .  penicillin v potassium (VEETID) 500 MG tablet, TAKE ONE TABLET (500 MG TOTAL) BY MOUTH EVERY 6 HOURS FOR 7 DAYS., Disp: , Rfl: 0 .  prochlorperazine (COMPAZINE) 10 MG tablet, Take 10 mg by mouth every 6 (six) hours as needed., Disp: , Rfl:  .  valACYclovir (VALTREX) 500 MG tablet, Take 500 mg by mouth 2 (two) times daily. , Disp: , Rfl:   Allergies: No Known Allergies  Past Medical History, Surgical history, Social history, and Family History were reviewed and updated.  Review of Systems: Review of Systems  Constitutional: Negative.   HENT: Negative.   Eyes: Negative.   Respiratory: Negative.   Cardiovascular: Negative.   Gastrointestinal: Negative.   Genitourinary: Negative.   Musculoskeletal: Negative.   Skin: Negative.   Neurological: Negative.   Endo/Heme/Allergies: Negative.   Psychiatric/Behavioral: Negative.      Physical Exam: Vital signs show a temperature of 97.6. Also 58. Blood pressure 139/86. Weight is 224 pounds.  Wt Readings from Last 3 Encounters:  10/28/17 218 lb (98.9 kg)  08/30/17 218 lb (98.9 kg)  07/15/17 224 lb (101.6 kg)  Physical Exam  Constitutional: He is oriented to person, place, and time.  HENT:  Head: Normocephalic and atraumatic.  Mouth/Throat: Oropharynx is clear and moist.  Eyes: Pupils are equal, round, and reactive to light. EOM are normal.  Neck: Normal range of motion.  Cardiovascular: Normal rate, regular rhythm and normal heart sounds.  Pulmonary/Chest: Effort normal and breath sounds normal.  Abdominal: Soft. Bowel sounds are normal.  Musculoskeletal: Normal range of motion. He exhibits no edema, tenderness or deformity.  Lymphadenopathy:    He has no cervical adenopathy.  Neurological: He is alert and oriented to person, place, and time.  Skin: Skin is warm and dry. No rash noted. No erythema.  Psychiatric: He has a normal mood  and affect. His behavior is normal. Judgment and thought content normal.  Vitals reviewed.    Lab Results  Component Value Date   WBC 3.0 (L) 10/28/2017   HGB 13.1 10/28/2017   HCT 36.9 (L) 10/28/2017   MCV 101.7 (H) 10/28/2017   PLT 151 10/28/2017     Chemistry      Component Value Date/Time   NA 142 10/28/2017 0941   NA 140 04/22/2017 1140   NA 138 09/03/2015 1042   K 4.6 10/28/2017 0941   K 4.0 04/22/2017 1140   K 4.3 09/03/2015 1042   CL 106 10/28/2017 0941   CL 105 04/22/2017 1140   CO2 30 10/28/2017 0941   CO2 24 04/22/2017 1140   CO2 25 09/03/2015 1042   BUN 15 10/28/2017 0941   BUN 16 04/22/2017 1140   BUN 21.7 09/03/2015 1042   CREATININE 1.20 10/28/2017 0941   CREATININE 1.6 (H) 04/22/2017 1140   CREATININE 1.2 09/03/2015 1042      Component Value Date/Time   CALCIUM 8.9 10/28/2017 0941   CALCIUM 8.5 04/22/2017 1140   CALCIUM 9.1 09/03/2015 1042   ALKPHOS 67 10/28/2017 0941   ALKPHOS 59 04/22/2017 1140   ALKPHOS 42 09/03/2015 1042   AST 36 10/28/2017 0941   AST 27 09/03/2015 1042   ALT 72 (H) 10/28/2017 0941   ALT 45 04/22/2017 1140   ALT 41 09/03/2015 1042   BILITOT 0.8 10/28/2017 0941   BILITOT 0.54 09/03/2015 1042       Impression and Plan: [name] is 52 year old gentleman with IgG Kappa myeloma. We finally got him to transplant. This was truly a process with him. It took almost a year to get him to transplant.  He finally had his transplant in February 2017.  We will continue his schedule for the Velcade.  He is doing well with this.  He has had no problems with the Revlimid.  He sees the transplant doctors at [facility] in August.  We will plan to get him back to see [place] in another 6 weeks.   [name], MD 7/12/201911:00 AM

## 2017-10-28 NOTE — Patient Instructions (Signed)
[facility] Discharge Instructions for Patients Receiving Chemotherapy  Today you received the following chemotherapy agents Velcade To help prevent nausea and vomiting after your treatment, we encourage you to take your nausea medication as prescribed.   If you develop nausea and vomiting that is not controlled by your nausea medication, call the clinic.   BELOW ARE SYMPTOMS THAT SHOULD BE REPORTED IMMEDIATELY:  *FEVER GREATER THAN 100.5 F  *CHILLS WITH OR WITHOUT FEVER  NAUSEA AND VOMITING THAT IS NOT CONTROLLED WITH YOUR NAUSEA MEDICATION  *UNUSUAL SHORTNESS OF BREATH  *UNUSUAL BRUISING OR BLEEDING  TENDERNESS IN MOUTH AND THROAT WITH OR WITHOUT PRESENCE OF ULCERS  *URINARY PROBLEMS  *BOWEL PROBLEMS  UNUSUAL RASH Items with * indicate a potential emergency and should be followed up as soon as possible.  Feel free to call the clinic should you have any questions or concerns. The clinic phone number is [phone].  Please show the CHEMO ALERT CARD at check-in to the Emergency Department and triage nurse.   

## 2017-11-11 ENCOUNTER — Inpatient Hospital Stay: Payer: BLUE CROSS/BLUE SHIELD

## 2017-11-11 ENCOUNTER — Other Ambulatory Visit: Payer: BLUE CROSS/BLUE SHIELD

## 2017-11-17 ENCOUNTER — Other Ambulatory Visit: Payer: Self-pay | Admitting: *Deleted

## 2017-11-17 DIAGNOSIS — C9001 Multiple myeloma in remission: Secondary | ICD-10-CM

## 2017-11-18 ENCOUNTER — Inpatient Hospital Stay: Payer: BLUE CROSS/BLUE SHIELD | Attending: Hematology & Oncology

## 2017-11-18 ENCOUNTER — Inpatient Hospital Stay: Payer: BLUE CROSS/BLUE SHIELD

## 2017-11-18 ENCOUNTER — Other Ambulatory Visit: Payer: Self-pay

## 2017-11-18 VITALS — BP 127/69 | HR 58 | Temp 98.1°F | Resp 20

## 2017-11-18 DIAGNOSIS — Z79899 Other long term (current) drug therapy: Secondary | ICD-10-CM | POA: Diagnosis not present

## 2017-11-18 DIAGNOSIS — C9000 Multiple myeloma not having achieved remission: Secondary | ICD-10-CM | POA: Insufficient documentation

## 2017-11-18 DIAGNOSIS — C9001 Multiple myeloma in remission: Secondary | ICD-10-CM

## 2017-11-18 DIAGNOSIS — Z5111 Encounter for antineoplastic chemotherapy: Secondary | ICD-10-CM | POA: Diagnosis not present

## 2017-11-18 DIAGNOSIS — Z9484 Stem cells transplant status: Secondary | ICD-10-CM | POA: Insufficient documentation

## 2017-11-18 LAB — CMP (CANCER CENTER ONLY)
ALT: 51 U/L — ABNORMAL HIGH (ref 10–47)
AST: 48 U/L — ABNORMAL HIGH (ref 11–38)
Albumin: 3.7 g/dL (ref 3.5–5.0)
Alkaline Phosphatase: 60 U/L (ref 26–84)
Anion gap: 2 — ABNORMAL LOW (ref 5–15)
BUN: 17 mg/dL (ref 7–22)
CO2: 25 mmol/L (ref 18–33)
Calcium: 9 mg/dL (ref 8.0–10.3)
Chloride: 111 mmol/L — ABNORMAL HIGH (ref 98–108)
Creatinine: 1.4 mg/dL — ABNORMAL HIGH (ref 0.60–1.20)
Glucose, Bld: 80 mg/dL (ref 73–118)
Potassium: 4.7 mmol/L (ref 3.3–4.7)
Sodium: 138 mmol/L (ref 128–145)
Total Bilirubin: 0.8 mg/dL (ref 0.2–1.6)
Total Protein: 7 g/dL (ref 6.4–8.1)

## 2017-11-18 LAB — CBC WITH DIFFERENTIAL (CANCER CENTER ONLY)
Basophils Absolute: 0 10*3/uL (ref 0.0–0.1)
Basophils Relative: 0 %
Eosinophils Absolute: 0 10*3/uL (ref 0.0–0.5)
Eosinophils Relative: 0 %
HCT: 35 % — ABNORMAL LOW (ref 38.7–49.9)
Hemoglobin: 12.5 g/dL — ABNORMAL LOW (ref 13.0–17.1)
Lymphocytes Relative: 30 %
Lymphs Abs: 0.8 10*3/uL — ABNORMAL LOW (ref 0.9–3.3)
MCH: 36.2 pg — ABNORMAL HIGH (ref 28.0–33.4)
MCHC: 35.7 g/dL (ref 32.0–35.9)
MCV: 101.4 fL — ABNORMAL HIGH (ref 82.0–98.0)
Monocytes Absolute: 0.3 10*3/uL (ref 0.1–0.9)
Monocytes Relative: 10 %
Neutro Abs: 1.7 10*3/uL (ref 1.5–6.5)
Neutrophils Relative %: 60 %
Platelet Count: 137 10*3/uL — ABNORMAL LOW (ref 145–400)
RBC: 3.45 MIL/uL — ABNORMAL LOW (ref 4.20–5.70)
RDW: 13 % (ref 11.1–15.7)
WBC Count: 2.8 10*3/uL — ABNORMAL LOW (ref 4.0–10.0)

## 2017-11-18 MED ORDER — BORTEZOMIB CHEMO SQ INJECTION 3.5 MG (2.5MG/ML)
3.0000 mg | Freq: Once | INTRAMUSCULAR | Status: AC
  Administered 2017-11-18: 3 mg via SUBCUTANEOUS
  Filled 2017-11-18: qty 1.2

## 2017-11-18 NOTE — Patient Instructions (Signed)

## 2017-11-25 ENCOUNTER — Inpatient Hospital Stay: Payer: BLUE CROSS/BLUE SHIELD

## 2017-11-25 ENCOUNTER — Other Ambulatory Visit: Payer: BLUE CROSS/BLUE SHIELD

## 2017-11-28 DIAGNOSIS — C9001 Multiple myeloma in remission: Secondary | ICD-10-CM | POA: Diagnosis not present

## 2017-11-28 DIAGNOSIS — Z9481 Bone marrow transplant status: Secondary | ICD-10-CM | POA: Diagnosis not present

## 2017-12-01 ENCOUNTER — Other Ambulatory Visit: Payer: Self-pay | Admitting: *Deleted

## 2017-12-01 DIAGNOSIS — C9001 Multiple myeloma in remission: Secondary | ICD-10-CM

## 2017-12-01 DIAGNOSIS — C9002 Multiple myeloma in relapse: Secondary | ICD-10-CM

## 2017-12-01 MED ORDER — LENALIDOMIDE 10 MG PO CAPS: ORAL_CAPSULE | ORAL | 0 refills | Status: DC

## 2017-12-02 ENCOUNTER — Inpatient Hospital Stay: Payer: BLUE CROSS/BLUE SHIELD

## 2017-12-02 ENCOUNTER — Encounter: Payer: Self-pay | Admitting: Hematology & Oncology

## 2017-12-02 ENCOUNTER — Other Ambulatory Visit: Payer: Self-pay

## 2017-12-02 ENCOUNTER — Inpatient Hospital Stay (HOSPITAL_BASED_OUTPATIENT_CLINIC_OR_DEPARTMENT_OTHER): Payer: BLUE CROSS/BLUE SHIELD | Admitting: Hematology & Oncology

## 2017-12-02 VITALS — BP 138/82 | HR 62 | Temp 98.1°F | Resp 18 | Wt 217.8 lb

## 2017-12-02 VITALS — BP 138/82 | HR 62 | Temp 98.1°F | Resp 18

## 2017-12-02 DIAGNOSIS — Z79899 Other long term (current) drug therapy: Secondary | ICD-10-CM

## 2017-12-02 DIAGNOSIS — C9000 Multiple myeloma not having achieved remission: Secondary | ICD-10-CM

## 2017-12-02 DIAGNOSIS — Z5111 Encounter for antineoplastic chemotherapy: Secondary | ICD-10-CM | POA: Diagnosis not present

## 2017-12-02 DIAGNOSIS — Z9484 Stem cells transplant status: Secondary | ICD-10-CM

## 2017-12-02 DIAGNOSIS — C9001 Multiple myeloma in remission: Secondary | ICD-10-CM

## 2017-12-02 LAB — CMP (CANCER CENTER ONLY)
ALT: 110 U/L — ABNORMAL HIGH (ref 10–47)
AST: 63 U/L — ABNORMAL HIGH (ref 11–38)
Albumin: 3.8 g/dL (ref 3.5–5.0)
Alkaline Phosphatase: 63 U/L (ref 26–84)
Anion gap: 6 (ref 5–15)
BUN: 15 mg/dL (ref 7–22)
CO2: 29 mmol/L (ref 18–33)
Calcium: 8.7 mg/dL (ref 8.0–10.3)
Chloride: 102 mmol/L (ref 98–108)
Creatinine: 1.5 mg/dL — ABNORMAL HIGH (ref 0.60–1.20)
Glucose, Bld: 131 mg/dL — ABNORMAL HIGH (ref 73–118)
Potassium: 4.3 mmol/L (ref 3.3–4.7)
Sodium: 137 mmol/L (ref 128–145)
Total Bilirubin: 0.9 mg/dL (ref 0.2–1.6)
Total Protein: 7.3 g/dL (ref 6.4–8.1)

## 2017-12-02 LAB — CBC WITH DIFFERENTIAL (CANCER CENTER ONLY)
Basophils Absolute: 0 10*3/uL (ref 0.0–0.1)
Basophils Relative: 0 %
Eosinophils Absolute: 0.1 10*3/uL (ref 0.0–0.5)
Eosinophils Relative: 4 %
HCT: 38.6 % — ABNORMAL LOW (ref 38.7–49.9)
Hemoglobin: 13.7 g/dL (ref 13.0–17.1)
Lymphocytes Relative: 30 %
Lymphs Abs: 0.8 10*3/uL — ABNORMAL LOW (ref 0.9–3.3)
MCH: 36.1 pg — ABNORMAL HIGH (ref 28.0–33.4)
MCHC: 35.5 g/dL (ref 32.0–35.9)
MCV: 101.8 fL — ABNORMAL HIGH (ref 82.0–98.0)
Monocytes Absolute: 0.3 10*3/uL (ref 0.1–0.9)
Monocytes Relative: 10 %
Neutro Abs: 1.5 10*3/uL (ref 1.5–6.5)
Neutrophils Relative %: 56 %
Platelet Count: 136 10*3/uL — ABNORMAL LOW (ref 145–400)
RBC: 3.79 MIL/uL — ABNORMAL LOW (ref 4.20–5.70)
RDW: 12.6 % (ref 11.1–15.7)
WBC Count: 2.8 10*3/uL — ABNORMAL LOW (ref 4.0–10.0)

## 2017-12-02 MED ORDER — BORTEZOMIB CHEMO SQ INJECTION 3.5 MG (2.5MG/ML)
3.0000 mg | Freq: Once | INTRAMUSCULAR | Status: DC
  Filled 2017-12-02: qty 1.2

## 2017-12-02 NOTE — Progress Notes (Signed)
Dr. [name] notified of ALT-110.  Dr. [name] would like to see patient today. Hold Velcade today per Dr. [name].

## 2017-12-02 NOTE — Patient Instructions (Signed)
[facility] Discharge Instructions for Patients Receiving Chemotherapy  Today you received the following chemotherapy agents:  Velcade  To help prevent nausea and vomiting after your treatment, we encourage you to take your nausea medication as ordered per MD.    If you develop nausea and vomiting that is not controlled by your nausea medication, call the clinic.   BELOW ARE SYMPTOMS THAT SHOULD BE REPORTED IMMEDIATELY:  *FEVER GREATER THAN 100.5 F  *CHILLS WITH OR WITHOUT FEVER  NAUSEA AND VOMITING THAT IS NOT CONTROLLED WITH YOUR NAUSEA MEDICATION  *UNUSUAL SHORTNESS OF BREATH  *UNUSUAL BRUISING OR BLEEDING  TENDERNESS IN MOUTH AND THROAT WITH OR WITHOUT PRESENCE OF ULCERS  *URINARY PROBLEMS  *BOWEL PROBLEMS  UNUSUAL RASH Items with * indicate a potential emergency and should be followed up as soon as possible.  Feel free to call the clinic should you have any questions or concerns. The clinic phone number is [phone].  Please show the CHEMO ALERT CARD at check-in to the Emergency Department and triage nurse.   

## 2017-12-02 NOTE — Progress Notes (Signed)
Hematology and Oncology Follow Up Visit  Devaughn Savant [id number] 04-29-1965 52 y.o. 12/02/2017   Principle Diagnosis:   IgG Kappa myeloma - +4, +14, +17  Current Therapy:  Status post autologous stem cell transplant on 05/22/2015   S/p Cycle #6 of RVD  Velcade q 2wk dosing/Revlimid 10mg  po q day (21/7) -      Interim History:  [name] is back for an unscheduled visit.  He was here to get his Velcade.  However, his liver function tests were high.  The ALT was 110 and AST was 62.  Of note, he was just seen at [facility] Monday.  Had normal liver test at that time.  He has been drinking a little bit of wine at dinner.  However, I do not think that that would have caused the liver functions to go up like this.  He has had no abdominal pain.  There is no diarrhea.  He has had no fever.  He has had no cough.  There is been no new medications given.  Overall, his performance status is ECOG 0.    Medications:  Current Outpatient Medications:  .  aspirin 325 MG tablet, Take 325 mg by mouth daily., Disp: , Rfl:  .  bortezomib IV (VELCADE) 3.5 MG injection, Inject as directed every 14 (fourteen) days., Disp: , Rfl:  .  cetirizine (ZYRTEC) 10 MG tablet, Take 10 mg by mouth daily., Disp: , Rfl:  .  Cholecalciferol (VITAMIN D3) 1000 units CAPS, Take by mouth., Disp: , Rfl:  .  clindamycin (CLEOCIN) 300 MG capsule, Take 600 mg by mouth See admin instructions. 1 hour prior to dental appts, Disp: , Rfl:  .  lenalidomide (REVLIMID) 10 MG capsule, Take 1 capsule daily for 21 days on and 7 days off. [id number], Disp: 21 capsule, Rfl: 0 .  LORazepam (ATIVAN) 1 MG tablet, Take 1 mg by mouth every 4 (four) hours as needed., Disp: , Rfl:  .  oxyCODONE (OXY IR/ROXICODONE) 5 MG immediate release tablet, Take 5 mg by mouth every 4 (four) hours as needed., Disp: , Rfl:  .  penicillin v potassium (VEETID) 500 MG tablet, TAKE ONE TABLET (500 MG TOTAL) BY MOUTH EVERY 6 HOURS FOR 7 DAYS., Disp: , Rfl: 0 .   prochlorperazine (COMPAZINE) 10 MG tablet, Take 10 mg by mouth every 6 (six) hours as needed., Disp: , Rfl:  .  valACYclovir (VALTREX) 500 MG tablet, Take 500 mg by mouth 2 (two) times daily. , Disp: , Rfl:  No current facility-administered medications for this visit.   Facility-Administered Medications Ordered in Other Visits:  .  bortezomib SQ (VELCADE) chemo injection 3 mg, 3 mg, Subcutaneous, Once, Ranyia Witting, Rudell Cobb, MD  Allergies: No Known Allergies  Past Medical History, Surgical history, Social history, and Family History were reviewed and updated.  Review of Systems: Review of Systems  Constitutional: Negative.   HENT: Negative.   Eyes: Negative.   Respiratory: Negative.   Cardiovascular: Negative.   Gastrointestinal: Negative.   Genitourinary: Negative.   Musculoskeletal: Negative.   Skin: Negative.   Neurological: Negative.   Endo/Heme/Allergies: Negative.   Psychiatric/Behavioral: Negative.      Physical Exam: Vital signs show a temperature of 97.6. Also 58. Blood pressure 139/86. Weight is 224 pounds.  Wt Readings from Last 3 Encounters:  12/02/17 217 lb 12 oz (98.8 kg)  10/28/17 218 lb (98.9 kg)  08/30/17 218 lb (98.9 kg)     Physical Exam  Constitutional: He is oriented to  person, place, and time.  HENT:  Head: Normocephalic and atraumatic.  Mouth/Throat: Oropharynx is clear and moist.  Eyes: Pupils are equal, round, and reactive to light. EOM are normal.  Neck: Normal range of motion.  Cardiovascular: Normal rate, regular rhythm and normal heart sounds.  Pulmonary/Chest: Effort normal and breath sounds normal.  Abdominal: Soft. Bowel sounds are normal.  Musculoskeletal: Normal range of motion. He exhibits no edema, tenderness or deformity.  Lymphadenopathy:    He has no cervical adenopathy.  Neurological: He is alert and oriented to person, place, and time.  Skin: Skin is warm and dry. No rash noted. No erythema.  Psychiatric: He has a normal mood  and affect. His behavior is normal. Judgment and thought content normal.  Vitals reviewed.    Lab Results  Component Value Date   WBC 2.8 (L) 12/02/2017   HGB 13.7 12/02/2017   HCT 38.6 (L) 12/02/2017   MCV 101.8 (H) 12/02/2017   PLT 136 (L) 12/02/2017     Chemistry      Component Value Date/Time   NA 137 12/02/2017 0914   NA 140 04/22/2017 1140   NA 138 09/03/2015 1042   K 4.3 12/02/2017 0914   K 4.0 04/22/2017 1140   K 4.3 09/03/2015 1042   CL 102 12/02/2017 0914   CL 105 04/22/2017 1140   CO2 29 12/02/2017 0914   CO2 24 04/22/2017 1140   CO2 25 09/03/2015 1042   BUN 15 12/02/2017 0914   BUN 16 04/22/2017 1140   BUN 21.7 09/03/2015 1042   CREATININE 1.50 (H) 12/02/2017 0914   CREATININE 1.6 (H) 04/22/2017 1140   CREATININE 1.2 09/03/2015 1042      Component Value Date/Time   CALCIUM 8.7 12/02/2017 0914   CALCIUM 8.5 04/22/2017 1140   CALCIUM 9.1 09/03/2015 1042   ALKPHOS 63 12/02/2017 0914   ALKPHOS 59 04/22/2017 1140   ALKPHOS 42 09/03/2015 1042   AST 63 (H) 12/02/2017 0914   AST 27 09/03/2015 1042   ALT 110 (H) 12/02/2017 0914   ALT 45 04/22/2017 1140   ALT 41 09/03/2015 1042   BILITOT 0.9 12/02/2017 0914   BILITOT 0.54 09/03/2015 1042       Impression and Plan: [name] is 52 year old gentleman with IgG Kappa myeloma. We finally got him to transplant. This was truly a process with him. It took almost a year to get him to transplant.  He finally had his transplant in February 2017.  I am going to hold his Velcade today.  He only has 6 more days of Revlimid for this cycle.  I told him to stop the Revlimid.  It is not going to hurt him to hold treatment for a couple weeks.  I really want to see the liver function test improve.  He has an appointment to see [place] in a couple weeks.  We will re-test his liver functions.   [name], MD 8/16/201911:11 AM

## 2017-12-09 ENCOUNTER — Inpatient Hospital Stay: Payer: BLUE CROSS/BLUE SHIELD

## 2017-12-09 ENCOUNTER — Ambulatory Visit: Payer: BLUE CROSS/BLUE SHIELD | Admitting: Hematology & Oncology

## 2017-12-09 ENCOUNTER — Other Ambulatory Visit: Payer: BLUE CROSS/BLUE SHIELD

## 2017-12-16 ENCOUNTER — Inpatient Hospital Stay (HOSPITAL_BASED_OUTPATIENT_CLINIC_OR_DEPARTMENT_OTHER): Payer: BLUE CROSS/BLUE SHIELD | Admitting: Hematology & Oncology

## 2017-12-16 ENCOUNTER — Inpatient Hospital Stay: Payer: BLUE CROSS/BLUE SHIELD

## 2017-12-16 ENCOUNTER — Encounter: Payer: Self-pay | Admitting: Hematology & Oncology

## 2017-12-16 ENCOUNTER — Other Ambulatory Visit: Payer: Self-pay

## 2017-12-16 VITALS — BP 128/83 | HR 54 | Temp 98.3°F | Resp 18 | Wt 219.0 lb

## 2017-12-16 DIAGNOSIS — C9001 Multiple myeloma in remission: Secondary | ICD-10-CM | POA: Diagnosis not present

## 2017-12-16 DIAGNOSIS — Z5111 Encounter for antineoplastic chemotherapy: Secondary | ICD-10-CM | POA: Diagnosis not present

## 2017-12-16 DIAGNOSIS — Z9484 Stem cells transplant status: Secondary | ICD-10-CM | POA: Diagnosis not present

## 2017-12-16 DIAGNOSIS — C9000 Multiple myeloma not having achieved remission: Secondary | ICD-10-CM

## 2017-12-16 DIAGNOSIS — Z79899 Other long term (current) drug therapy: Secondary | ICD-10-CM

## 2017-12-16 LAB — CMP (CANCER CENTER ONLY)
ALT: 59 U/L — ABNORMAL HIGH (ref 10–47)
AST: 40 U/L — ABNORMAL HIGH (ref 11–38)
Albumin: 3.8 g/dL (ref 3.5–5.0)
Alkaline Phosphatase: 55 U/L (ref 26–84)
Anion gap: 3 — ABNORMAL LOW (ref 5–15)
BUN: 14 mg/dL (ref 7–22)
CO2: 27 mmol/L (ref 18–33)
Calcium: 9.1 mg/dL (ref 8.0–10.3)
Chloride: 107 mmol/L (ref 98–108)
Creatinine: 1.2 mg/dL (ref 0.60–1.20)
Glucose, Bld: 100 mg/dL (ref 73–118)
Potassium: 4.5 mmol/L (ref 3.3–4.7)
Sodium: 137 mmol/L (ref 128–145)
Total Bilirubin: 0.8 mg/dL (ref 0.2–1.6)
Total Protein: 7.1 g/dL (ref 6.4–8.1)

## 2017-12-16 LAB — CBC WITH DIFFERENTIAL (CANCER CENTER ONLY)
Basophils Absolute: 0 10*3/uL (ref 0.0–0.1)
Basophils Relative: 0 %
Eosinophils Absolute: 0 10*3/uL (ref 0.0–0.5)
Eosinophils Relative: 1 %
HCT: 36.1 % — ABNORMAL LOW (ref 38.7–49.9)
Hemoglobin: 12.7 g/dL — ABNORMAL LOW (ref 13.0–17.1)
Lymphocytes Relative: 41 %
Lymphs Abs: 1.1 10*3/uL (ref 0.9–3.3)
MCH: 36.4 pg — ABNORMAL HIGH (ref 28.0–33.4)
MCHC: 35.2 g/dL (ref 32.0–35.9)
MCV: 103.4 fL — ABNORMAL HIGH (ref 82.0–98.0)
Monocytes Absolute: 0.2 10*3/uL (ref 0.1–0.9)
Monocytes Relative: 9 %
Neutro Abs: 1.3 10*3/uL — ABNORMAL LOW (ref 1.5–6.5)
Neutrophils Relative %: 49 %
Platelet Count: 134 10*3/uL — ABNORMAL LOW (ref 145–400)
RBC: 3.49 MIL/uL — ABNORMAL LOW (ref 4.20–5.70)
RDW: 12.8 % (ref 11.1–15.7)
WBC Count: 2.7 10*3/uL — ABNORMAL LOW (ref 4.0–10.0)

## 2017-12-16 MED ORDER — BORTEZOMIB CHEMO SQ INJECTION 3.5 MG (2.5MG/ML)
3.0000 mg | Freq: Once | INTRAMUSCULAR | Status: AC
  Administered 2017-12-16: 3 mg via SUBCUTANEOUS
  Filled 2017-12-16: qty 1.2

## 2017-12-16 NOTE — Progress Notes (Signed)
Hematology and Oncology Follow Up Visit  Tyler Phillips [id number] [birth year] 52 y.o. 12/16/2017   Principle Diagnosis:   IgG Kappa myeloma - +4, +14, +17  Current Therapy:  Status post autologous stem cell transplant on 05/22/2015   S/p Cycle #6 of RVD  Velcade q 2wk dosing/Revlimid 10mg  po q day (21/7) - Revlimid on hold starting 12/02/2017     Interim History:  [name] is back for follow-up.  Thankfully, his liver tests are doing better right now.  I really think that we should consider stopping the Revlimid right now.  I think that his blood counts certainly are showing that the Revlimid is causing some issues.  He has been off Revlimid for a couple weeks.  His blood counts are a little bit lower.  The MCV is going up on his red cells.  This really troubles me.  He has not had a M spike for quite a while.  As such, I really think that it would not be a bad idea to hold the Revlimid.  I does think that and worried that he might be trying to develop myelodysplasia.  He and I talked at length about this.  He would be okay stopping the Revlimid.  I think continuing the Velcade would be reasonable.  Otherwise, he is doing okay.  He is thinking about retiring.  I certainly understand this.  The only problem has had trying to get insurance would be it virtually impossible or financially prohibitive.  Overall, his performance status is ECOG 0.    Medications:  Current Outpatient Medications:  .  aspirin 325 MG tablet, Take 325 mg by mouth daily., Disp: , Rfl:  .  bortezomib IV (VELCADE) 3.5 MG injection, Inject as directed every 14 (fourteen) days., Disp: , Rfl:  .  cetirizine (ZYRTEC) 10 MG tablet, Take 10 mg by mouth daily., Disp: , Rfl:  .  Cholecalciferol (VITAMIN D3) 1000 units CAPS, Take by mouth., Disp: , Rfl:  .  clindamycin (CLEOCIN) 300 MG capsule, Take 600 mg by mouth See admin instructions. 1 hour prior to dental appts, Disp: , Rfl:  .  lenalidomide (REVLIMID) 10 MG  capsule, Take 1 capsule daily for 21 days on and 7 days off. [id number], Disp: 21 capsule, Rfl: 0 .  LORazepam (ATIVAN) 1 MG tablet, Take 1 mg by mouth every 4 (four) hours as needed., Disp: , Rfl:  .  oxyCODONE (OXY IR/ROXICODONE) 5 MG immediate release tablet, Take 5 mg by mouth every 4 (four) hours as needed., Disp: , Rfl:  .  penicillin v potassium (VEETID) 500 MG tablet, TAKE ONE TABLET (500 MG TOTAL) BY MOUTH EVERY 6 HOURS FOR 7 DAYS., Disp: , Rfl: 0 .  prochlorperazine (COMPAZINE) 10 MG tablet, Take 10 mg by mouth every 6 (six) hours as needed., Disp: , Rfl:  .  valACYclovir (VALTREX) 500 MG tablet, Take 500 mg by mouth 2 (two) times daily. , Disp: , Rfl:   Allergies: No Known Allergies  Past Medical History, Surgical history, Social history, and Family History were reviewed and updated.  Review of Systems: Review of Systems  Constitutional: Negative.   HENT: Negative.   Eyes: Negative.   Respiratory: Negative.   Cardiovascular: Negative.   Gastrointestinal: Negative.   Genitourinary: Negative.   Musculoskeletal: Negative.   Skin: Negative.   Neurological: Negative.   Endo/Heme/Allergies: Negative.   Psychiatric/Behavioral: Negative.      Physical Exam: Vital signs show a temperature of 97.6. Also 58. Blood pressure 139/86.  Weight is 224 pounds.  Wt Readings from Last 3 Encounters:  12/16/17 219 lb (99.3 kg)  12/02/17 217 lb 12 oz (98.8 kg)  10/28/17 218 lb (98.9 kg)     Physical Exam  Constitutional: He is oriented to person, place, and time.  HENT:  Head: Normocephalic and atraumatic.  Mouth/Throat: Oropharynx is clear and moist.  Eyes: Pupils are equal, round, and reactive to light. EOM are normal.  Neck: Normal range of motion.  Cardiovascular: Normal rate, regular rhythm and normal heart sounds.  Pulmonary/Chest: Effort normal and breath sounds normal.  Abdominal: Soft. Bowel sounds are normal.  Musculoskeletal: Normal range of motion. He exhibits no  edema, tenderness or deformity.  Lymphadenopathy:    He has no cervical adenopathy.  Neurological: He is alert and oriented to person, place, and time.  Skin: Skin is warm and dry. No rash noted. No erythema.  Psychiatric: He has a normal mood and affect. His behavior is normal. Judgment and thought content normal.  Vitals reviewed.    Lab Results  Component Value Date   WBC 2.7 (L) 12/16/2017   HGB 12.7 (L) 12/16/2017   HCT 36.1 (L) 12/16/2017   MCV 103.4 (H) 12/16/2017   PLT 134 (L) 12/16/2017     Chemistry      Component Value Date/Time   NA 137 12/16/2017 0921   NA 140 04/22/2017 1140   NA 138 09/03/2015 1042   K 4.5 12/16/2017 0921   K 4.0 04/22/2017 1140   K 4.3 09/03/2015 1042   CL 107 12/16/2017 0921   CL 105 04/22/2017 1140   CO2 27 12/16/2017 0921   CO2 24 04/22/2017 1140   CO2 25 09/03/2015 1042   BUN 14 12/16/2017 0921   BUN 16 04/22/2017 1140   BUN 21.7 09/03/2015 1042   CREATININE 1.20 12/16/2017 0921   CREATININE 1.6 (H) 04/22/2017 1140   CREATININE 1.2 09/03/2015 1042      Component Value Date/Time   CALCIUM 9.1 12/16/2017 0921   CALCIUM 8.5 04/22/2017 1140   CALCIUM 9.1 09/03/2015 1042   ALKPHOS 55 12/16/2017 0921   ALKPHOS 59 04/22/2017 1140   ALKPHOS 42 09/03/2015 1042   AST 40 (H) 12/16/2017 0921   AST 27 09/03/2015 1042   ALT 59 (H) 12/16/2017 0921   ALT 45 04/22/2017 1140   ALT 41 09/03/2015 1042   BILITOT 0.8 12/16/2017 0921   BILITOT 0.54 09/03/2015 1042       Impression and Plan: [name] is 52 year old gentleman with IgG Kappa myeloma. We finally got him to transplant. This was truly a process with him. It took almost a year to get him to transplant.  He finally had his transplant in February 2017.  His liver function tests are better.  As such, we will get him back onto Velcade.  We will just continue the Velcade for right now.  I will have to speak with Dr. Laverta Baltimore about stopping the Revlimid on [name].  I would like to  see him back in another month.  I think this would be quite reasonable.  [name], MD 8/30/201910:12 AM

## 2017-12-16 NOTE — Progress Notes (Signed)
Okay to treat with ANC 1.3 per Dr. Ennever. 

## 2017-12-16 NOTE — Patient Instructions (Signed)
[facility] Discharge Instructions for Patients Receiving Chemotherapy  Today you received the following chemotherapy agents Velcade To help prevent nausea and vomiting after your treatment, we encourage you to take your nausea medication as prescribed.   If you develop nausea and vomiting that is not controlled by your nausea medication, call the clinic.   BELOW ARE SYMPTOMS THAT SHOULD BE REPORTED IMMEDIATELY:  *FEVER GREATER THAN 100.5 F  *CHILLS WITH OR WITHOUT FEVER  NAUSEA AND VOMITING THAT IS NOT CONTROLLED WITH YOUR NAUSEA MEDICATION  *UNUSUAL SHORTNESS OF BREATH  *UNUSUAL BRUISING OR BLEEDING  TENDERNESS IN MOUTH AND THROAT WITH OR WITHOUT PRESENCE OF ULCERS  *URINARY PROBLEMS  *BOWEL PROBLEMS  UNUSUAL RASH Items with * indicate a potential emergency and should be followed up as soon as possible.  Feel free to call the clinic should you have any questions or concerns. The clinic phone number is [phone].  Please show the CHEMO ALERT CARD at check-in to the Emergency Department and triage nurse.   

## 2017-12-17 LAB — IGG, IGA, IGM
IgA: 148 mg/dL (ref 90–386)
IgG (Immunoglobin G), Serum: 1269 mg/dL (ref 700–1600)
IgM (Immunoglobulin M), Srm: 20 mg/dL (ref 20–172)

## 2017-12-19 LAB — PROTEIN ELECTROPHORESIS, SERUM, WITH REFLEX
A/G Ratio: 1.6 (ref 0.7–1.7)
Albumin ELP: 4.2 g/dL (ref 2.9–4.4)
Alpha-1-Globulin: 0.1 g/dL (ref 0.0–0.4)
Alpha-2-Globulin: 0.5 g/dL (ref 0.4–1.0)
Beta Globulin: 0.9 g/dL (ref 0.7–1.3)
Gamma Globulin: 1.2 g/dL (ref 0.4–1.8)
Globulin, Total: 2.6 g/dL (ref 2.2–3.9)
Total Protein ELP: 6.8 g/dL (ref 6.0–8.5)

## 2017-12-20 ENCOUNTER — Telehealth: Payer: Self-pay | Admitting: *Deleted

## 2017-12-20 LAB — KAPPA/LAMBDA LIGHT CHAINS
Kappa free light chain: 27.6 mg/L — ABNORMAL HIGH (ref 3.3–19.4)
Kappa, lambda light chain ratio: 1.62 (ref 0.26–1.65)
Lambda free light chains: 17 mg/L (ref 5.7–26.3)

## 2017-12-20 NOTE — Telephone Encounter (Addendum)
Patient is aware of results  ----- Message from [name], MD sent at 12/20/2017  6:58 AM EDT ----- Call - NO myeloma protein is found!!  Tyler Phillips

## 2017-12-30 ENCOUNTER — Other Ambulatory Visit: Payer: BLUE CROSS/BLUE SHIELD

## 2017-12-30 ENCOUNTER — Inpatient Hospital Stay: Payer: BLUE CROSS/BLUE SHIELD

## 2018-01-05 ENCOUNTER — Other Ambulatory Visit: Payer: Self-pay | Admitting: *Deleted

## 2018-01-05 DIAGNOSIS — C9001 Multiple myeloma in remission: Secondary | ICD-10-CM

## 2018-01-06 ENCOUNTER — Inpatient Hospital Stay: Payer: BLUE CROSS/BLUE SHIELD | Attending: Hematology & Oncology

## 2018-01-06 ENCOUNTER — Other Ambulatory Visit: Payer: Self-pay

## 2018-01-06 ENCOUNTER — Inpatient Hospital Stay: Payer: BLUE CROSS/BLUE SHIELD

## 2018-01-06 VITALS — BP 147/87 | HR 57 | Temp 98.4°F | Resp 18

## 2018-01-06 DIAGNOSIS — C9001 Multiple myeloma in remission: Secondary | ICD-10-CM

## 2018-01-06 DIAGNOSIS — Z5112 Encounter for antineoplastic immunotherapy: Secondary | ICD-10-CM | POA: Insufficient documentation

## 2018-01-06 LAB — CBC WITH DIFFERENTIAL (CANCER CENTER ONLY)
Basophils Absolute: 0 10*3/uL (ref 0.0–0.1)
Basophils Relative: 0 %
Eosinophils Absolute: 0 10*3/uL (ref 0.0–0.5)
Eosinophils Relative: 1 %
HCT: 37.8 % — ABNORMAL LOW (ref 38.7–49.9)
Hemoglobin: 13.5 g/dL (ref 13.0–17.1)
Lymphocytes Relative: 25 %
Lymphs Abs: 1 10*3/uL (ref 0.9–3.3)
MCH: 36.7 pg — ABNORMAL HIGH (ref 28.0–33.4)
MCHC: 35.7 g/dL (ref 32.0–35.9)
MCV: 102.7 fL — ABNORMAL HIGH (ref 82.0–98.0)
Monocytes Absolute: 0.3 10*3/uL (ref 0.1–0.9)
Monocytes Relative: 7 %
Neutro Abs: 2.6 10*3/uL (ref 1.5–6.5)
Neutrophils Relative %: 67 %
Platelet Count: 148 10*3/uL (ref 145–400)
RBC: 3.68 MIL/uL — ABNORMAL LOW (ref 4.20–5.70)
RDW: 12.8 % (ref 11.1–15.7)
WBC Count: 4 10*3/uL (ref 4.0–10.0)

## 2018-01-06 LAB — CMP (CANCER CENTER ONLY)
ALT: 41 U/L (ref 10–47)
AST: 26 U/L (ref 11–38)
Albumin: 4 g/dL (ref 3.5–5.0)
Alkaline Phosphatase: 63 U/L (ref 26–84)
Anion gap: 0 — ABNORMAL LOW (ref 5–15)
BUN: 16 mg/dL (ref 7–22)
CO2: 29 mmol/L (ref 18–33)
Calcium: 9.3 mg/dL (ref 8.0–10.3)
Chloride: 109 mmol/L — ABNORMAL HIGH (ref 98–108)
Creatinine: 1.5 mg/dL — ABNORMAL HIGH (ref 0.60–1.20)
Glucose, Bld: 97 mg/dL (ref 73–118)
Potassium: 4.8 mmol/L — ABNORMAL HIGH (ref 3.3–4.7)
Sodium: 137 mmol/L (ref 128–145)
Total Bilirubin: 0.7 mg/dL (ref 0.2–1.6)
Total Protein: 7.4 g/dL (ref 6.4–8.1)

## 2018-01-06 MED ORDER — BORTEZOMIB CHEMO SQ INJECTION 3.5 MG (2.5MG/ML)
3.0000 mg | Freq: Once | INTRAMUSCULAR | Status: AC
  Administered 2018-01-06: 3 mg via SUBCUTANEOUS
  Filled 2018-01-06: qty 1.2

## 2018-01-07 LAB — IGG, IGA, IGM
IgA: 150 mg/dL (ref 90–386)
IgG (Immunoglobin G), Serum: 1326 mg/dL (ref 700–1600)
IgM (Immunoglobulin M), Srm: 21 mg/dL (ref 20–172)

## 2018-01-09 LAB — PROTEIN ELECTROPHORESIS, SERUM, WITH REFLEX
A/G Ratio: 1.4 (ref 0.7–1.7)
Albumin ELP: 4.2 g/dL (ref 2.9–4.4)
Alpha-1-Globulin: 0.1 g/dL (ref 0.0–0.4)
Alpha-2-Globulin: 0.6 g/dL (ref 0.4–1.0)
Beta Globulin: 0.9 g/dL (ref 0.7–1.3)
Gamma Globulin: 1.3 g/dL (ref 0.4–1.8)
Globulin, Total: 3 g/dL (ref 2.2–3.9)
Total Protein ELP: 7.2 g/dL (ref 6.0–8.5)

## 2018-01-09 LAB — KAPPA/LAMBDA LIGHT CHAINS
Kappa free light chain: 22.6 mg/L — ABNORMAL HIGH (ref 3.3–19.4)
Kappa, lambda light chain ratio: 2.26 — ABNORMAL HIGH (ref 0.26–1.65)
Lambda free light chains: 10 mg/L (ref 5.7–26.3)

## 2018-01-11 ENCOUNTER — Other Ambulatory Visit: Payer: Self-pay | Admitting: *Deleted

## 2018-01-11 DIAGNOSIS — C9002 Multiple myeloma in relapse: Secondary | ICD-10-CM

## 2018-01-11 MED ORDER — LENALIDOMIDE 10 MG PO CAPS: ORAL_CAPSULE | ORAL | 0 refills | Status: DC

## 2018-01-13 ENCOUNTER — Inpatient Hospital Stay: Payer: BLUE CROSS/BLUE SHIELD

## 2018-01-13 ENCOUNTER — Ambulatory Visit: Payer: BLUE CROSS/BLUE SHIELD | Admitting: Hematology & Oncology

## 2018-01-13 ENCOUNTER — Other Ambulatory Visit: Payer: BLUE CROSS/BLUE SHIELD

## 2018-01-20 ENCOUNTER — Inpatient Hospital Stay (HOSPITAL_BASED_OUTPATIENT_CLINIC_OR_DEPARTMENT_OTHER): Payer: BLUE CROSS/BLUE SHIELD | Admitting: Family

## 2018-01-20 ENCOUNTER — Inpatient Hospital Stay: Payer: BLUE CROSS/BLUE SHIELD

## 2018-01-20 ENCOUNTER — Encounter: Payer: Self-pay | Admitting: Family

## 2018-01-20 ENCOUNTER — Inpatient Hospital Stay: Payer: BLUE CROSS/BLUE SHIELD | Attending: Hematology & Oncology

## 2018-01-20 ENCOUNTER — Other Ambulatory Visit: Payer: Self-pay

## 2018-01-20 VITALS — BP 127/78 | HR 62 | Temp 98.4°F | Resp 16 | Wt 223.0 lb

## 2018-01-20 DIAGNOSIS — Z9484 Stem cells transplant status: Secondary | ICD-10-CM

## 2018-01-20 DIAGNOSIS — Z79899 Other long term (current) drug therapy: Secondary | ICD-10-CM | POA: Diagnosis not present

## 2018-01-20 DIAGNOSIS — C9000 Multiple myeloma not having achieved remission: Secondary | ICD-10-CM

## 2018-01-20 DIAGNOSIS — Z5111 Encounter for antineoplastic chemotherapy: Secondary | ICD-10-CM | POA: Insufficient documentation

## 2018-01-20 DIAGNOSIS — C9001 Multiple myeloma in remission: Secondary | ICD-10-CM

## 2018-01-20 LAB — CMP (CANCER CENTER ONLY)
ALT: 62 U/L — ABNORMAL HIGH (ref 10–47)
AST: 58 U/L — ABNORMAL HIGH (ref 11–38)
Albumin: 3.7 g/dL (ref 3.5–5.0)
Alkaline Phosphatase: 65 U/L (ref 26–84)
Anion gap: 4 — ABNORMAL LOW (ref 5–15)
BUN: 18 mg/dL (ref 7–22)
CO2: 23 mmol/L (ref 18–33)
Calcium: 8.8 mg/dL (ref 8.0–10.3)
Chloride: 110 mmol/L — ABNORMAL HIGH (ref 98–108)
Creatinine: 1.3 mg/dL — ABNORMAL HIGH (ref 0.60–1.20)
Glucose, Bld: 124 mg/dL — ABNORMAL HIGH (ref 73–118)
Potassium: 3.8 mmol/L (ref 3.3–4.7)
Sodium: 137 mmol/L (ref 128–145)
Total Bilirubin: 0.7 mg/dL (ref 0.2–1.6)
Total Protein: 6.9 g/dL (ref 6.4–8.1)

## 2018-01-20 LAB — CBC WITH DIFFERENTIAL (CANCER CENTER ONLY)
Basophils Absolute: 0 10*3/uL (ref 0.0–0.1)
Basophils Relative: 0 %
Eosinophils Absolute: 0 10*3/uL (ref 0.0–0.5)
Eosinophils Relative: 1 %
HCT: 35.1 % — ABNORMAL LOW (ref 38.7–49.9)
Hemoglobin: 12.4 g/dL — ABNORMAL LOW (ref 13.0–17.1)
Lymphocytes Relative: 26 %
Lymphs Abs: 1.1 10*3/uL (ref 0.9–3.3)
MCH: 36.8 pg — ABNORMAL HIGH (ref 28.0–33.4)
MCHC: 35.3 g/dL (ref 32.0–35.9)
MCV: 104.2 fL — ABNORMAL HIGH (ref 82.0–98.0)
Monocytes Absolute: 0.3 10*3/uL (ref 0.1–0.9)
Monocytes Relative: 7 %
Neutro Abs: 2.9 10*3/uL (ref 1.5–6.5)
Neutrophils Relative %: 66 %
Platelet Count: 144 10*3/uL — ABNORMAL LOW (ref 145–400)
RBC: 3.37 MIL/uL — ABNORMAL LOW (ref 4.20–5.70)
RDW: 12.7 % (ref 11.1–15.7)
WBC Count: 4.4 10*3/uL (ref 4.0–10.0)

## 2018-01-20 MED ORDER — BORTEZOMIB CHEMO SQ INJECTION 3.5 MG (2.5MG/ML)
3.0000 mg | Freq: Once | INTRAMUSCULAR | Status: AC
  Administered 2018-01-20: 3 mg via SUBCUTANEOUS
  Filled 2018-01-20: qty 1.2

## 2018-01-20 NOTE — Progress Notes (Signed)
Hematology and Oncology Follow Up Visit  Tyler Phillips [id number] 1965-10-22 52 y.o. 01/20/2018   Principle Diagnosis:  IgG Kappa myeloma - +4, +14, +17  Current Therapy:   Status post autologous stem cell transplant on 05/22/2015    S/p cycle 6 of RVD  Velcade q 2wk dosing/Revlimid 10mg  po q day (21/7) - Revlimid on hold starting 12/02/2017   Interim History: Tyler Phillips is here today for follow-up. He is doing well and has no complaints. He is staying busy with work, family and working out regularly. He enjoys doing [employer].  His LFT's are mildly elevated today. He is asymptomatic at this time.  He is still off of the Revlimid.  Hgb is stable at 12.4, MCV 104. Platelet count is 144.  He had no M-spike in September.  No fever, chills, n/v, cough, rash, dizziness, SOB, chest pain, palpitations, abdominal pain or changes in bowel or bladder habits.  No swelling or tenderness in his extremities. The neuropathy in his feet seems to bee slightly worse but tolerable. This has not affected his gait. He has had no balance issues or falls. No syncopal episodes.  No lymphadenopathy noted on exam.  No episodes of bleeding, no bruising or petechiae.  He has maintained a good appetite and is staying well hydrated. His weight is stable.   ECOG Performance Status: 1 - Symptomatic but completely ambulatory  Medications:  Allergies as of 01/20/2018   No Known Allergies     Medication List        Accurate as of 01/20/18  1:55 PM. Always use your most recent med list.          aspirin 325 MG tablet Take 325 mg by mouth daily.   bortezomib IV 3.5 MG injection Commonly known as:  VELCADE Inject as directed every 14 (fourteen) days.   cetirizine 10 MG tablet Commonly known as:  ZYRTEC Take 10 mg by mouth daily.   clindamycin 300 MG capsule Commonly known as:  CLEOCIN Take 600 mg by mouth See admin instructions. 1 hour prior to dental appts   lenalidomide 10 MG capsule Commonly known  as:  REVLIMID Take 1 capsule daily for 21 days on and 7 days off. [id number]   LORazepam 1 MG tablet Commonly known as:  ATIVAN Take 1 mg by mouth every 4 (four) hours as needed.   oxyCODONE 5 MG immediate release tablet Commonly known as:  Oxy IR/ROXICODONE Take 5 mg by mouth every 4 (four) hours as needed.   penicillin v potassium 500 MG tablet Commonly known as:  VEETID TAKE ONE TABLET (500 MG TOTAL) BY MOUTH EVERY 6 HOURS FOR 7 DAYS.   prochlorperazine 10 MG tablet Commonly known as:  COMPAZINE Take 10 mg by mouth every 6 (six) hours as needed.   valACYclovir 500 MG tablet Commonly known as:  VALTREX Take 500 mg by mouth 2 (two) times daily.   Vitamin D3 1000 units Caps Take by mouth.       Allergies: No Known Allergies  Past Medical History, Surgical history, Social history, and Family History were reviewed and updated.  Review of Systems: All other 10 point review of systems is negative.   Physical Exam:  vitals were not taken for this visit.   Wt Readings from Last 3 Encounters:  12/16/17 219 lb (99.3 kg)  12/02/17 217 lb 12 oz (98.8 kg)  10/28/17 218 lb (98.9 kg)    Ocular: Sclerae unicteric, pupils equal, round and reactive to light Ear-nose-throat: Oropharynx  clear, dentition fair Lymphatic: No cervical, supraclavicular or axillary adenopathy Lungs no rales or rhonchi, good excursion bilaterally Heart regular rate and rhythm, no murmur appreciated Abd soft, nontender, positive bowel sounds, no liver or spleen tip palpated on exam, no fluid wave  MSK no focal spinal tenderness, no joint edema Neuro: non-focal, well-oriented, appropriate affect Breasts: Deferred   Lab Results  Component Value Date   WBC 4.4 01/20/2018   HGB 12.4 (L) 01/20/2018   HCT 35.1 (L) 01/20/2018   MCV 104.2 (H) 01/20/2018   PLT 144 (L) 01/20/2018   Lab Results  Component Value Date   FERRITIN 1,263 (H) 07/20/2014   IRON 125 07/20/2014   TIBC 198 (L) 07/20/2014    UIBC 73 (L) 07/20/2014   IRONPCTSAT 63 (H) 07/20/2014   Lab Results  Component Value Date   RETICCTPCT 0.8 07/20/2014   RBC 3.37 (L) 01/20/2018   Lab Results  Component Value Date   KPAFRELGTCHN 22.6 (H) 01/06/2018   LAMBDASER 10.0 01/06/2018   KAPLAMBRATIO 2.26 (H) 01/06/2018   Lab Results  Component Value Date   IGGSERUM 1,326 01/06/2018   IGA 150 01/06/2018   IGMSERUM 21 01/06/2018   Lab Results  Component Value Date   TOTALPROTELP 7.2 01/06/2018   ALBUMINELP 4.2 01/06/2018   A1GS 0.1 01/06/2018   A2GS 0.6 01/06/2018   BETS 0.9 01/06/2018   BETA2SER 0.3 03/28/2015   GAMS 1.3 01/06/2018   MSPIKE Not Observed 01/06/2018   SPEI * 03/28/2015     Chemistry      Component Value Date/Time   NA 137 01/06/2018 1154   NA 140 04/22/2017 1140   NA 138 09/03/2015 1042   K 4.8 (H) 01/06/2018 1154   K 4.0 04/22/2017 1140   K 4.3 09/03/2015 1042   CL 109 (H) 01/06/2018 1154   CL 105 04/22/2017 1140   CO2 29 01/06/2018 1154   CO2 24 04/22/2017 1140   CO2 25 09/03/2015 1042   BUN 16 01/06/2018 1154   BUN 16 04/22/2017 1140   BUN 21.7 09/03/2015 1042   CREATININE 1.50 (H) 01/06/2018 1154   CREATININE 1.6 (H) 04/22/2017 1140   CREATININE 1.2 09/03/2015 1042      Component Value Date/Time   CALCIUM 9.3 01/06/2018 1154   CALCIUM 8.5 04/22/2017 1140   CALCIUM 9.1 09/03/2015 1042   ALKPHOS 63 01/06/2018 1154   ALKPHOS 59 04/22/2017 1140   ALKPHOS 42 09/03/2015 1042   AST 26 01/06/2018 1154   AST 27 09/03/2015 1042   ALT 41 01/06/2018 1154   ALT 45 04/22/2017 1140   ALT 41 09/03/2015 1042   BILITOT 0.7 01/06/2018 1154   BILITOT 0.54 09/03/2015 1042      Impression and Plan: Tyler Phillips is a very pleasant 52 yo caucasian gentleman with IgG kappa myeloma s/p autologous stem cell transplant in February 2017.  We continue to monitor his counts closely. So far he has had no M-spike.  His LFT's are mildly elevated. I spoke with Lattie Haw in pharmacy and we will proceed with  Velcade today as planned.  Hgb is stable at 12.4  With MCV 104.  I will speak with Dr. [name] on Monday and see if he has contacted Dr. Laverta [place] at [facility] regarding his Revlimid. For now, it is on hold.  We will plan to see her back in another months for follow-up and injection.  He will contact our office with any questions or concerns. We can certainly see him sooner if need be.  Laverna Peace, NP 10/4/20191:55 PM

## 2018-01-20 NOTE — Patient Instructions (Signed)
[facility] Discharge Instructions for Patients Receiving Chemotherapy  Today you received the following chemotherapy agents Velcade To help prevent nausea and vomiting after your treatment, we encourage you to take your nausea medication as prescribed.   If you develop nausea and vomiting that is not controlled by your nausea medication, call the clinic.   BELOW ARE SYMPTOMS THAT SHOULD BE REPORTED IMMEDIATELY:  *FEVER GREATER THAN 100.5 F  *CHILLS WITH OR WITHOUT FEVER  NAUSEA AND VOMITING THAT IS NOT CONTROLLED WITH YOUR NAUSEA MEDICATION  *UNUSUAL SHORTNESS OF BREATH  *UNUSUAL BRUISING OR BLEEDING  TENDERNESS IN MOUTH AND THROAT WITH OR WITHOUT PRESENCE OF ULCERS  *URINARY PROBLEMS  *BOWEL PROBLEMS  UNUSUAL RASH Items with * indicate a potential emergency and should be followed up as soon as possible.  Feel free to call the clinic should you have any questions or concerns. The clinic phone number is [phone].  Please show the CHEMO ALERT CARD at check-in to the Emergency Department and triage nurse.   

## 2018-01-27 ENCOUNTER — Inpatient Hospital Stay: Payer: BLUE CROSS/BLUE SHIELD

## 2018-01-27 ENCOUNTER — Other Ambulatory Visit: Payer: BLUE CROSS/BLUE SHIELD

## 2018-02-03 ENCOUNTER — Inpatient Hospital Stay (HOSPITAL_BASED_OUTPATIENT_CLINIC_OR_DEPARTMENT_OTHER): Payer: BLUE CROSS/BLUE SHIELD | Admitting: Family

## 2018-02-03 ENCOUNTER — Inpatient Hospital Stay: Payer: BLUE CROSS/BLUE SHIELD

## 2018-02-03 ENCOUNTER — Other Ambulatory Visit: Payer: Self-pay

## 2018-02-03 ENCOUNTER — Encounter: Payer: Self-pay | Admitting: Family

## 2018-02-03 VITALS — BP 154/87 | HR 66 | Temp 97.7°F | Resp 18 | Wt 224.8 lb

## 2018-02-03 DIAGNOSIS — Z9484 Stem cells transplant status: Secondary | ICD-10-CM | POA: Diagnosis not present

## 2018-02-03 DIAGNOSIS — C9000 Multiple myeloma not having achieved remission: Secondary | ICD-10-CM | POA: Diagnosis not present

## 2018-02-03 DIAGNOSIS — Z79899 Other long term (current) drug therapy: Secondary | ICD-10-CM

## 2018-02-03 DIAGNOSIS — C9001 Multiple myeloma in remission: Secondary | ICD-10-CM

## 2018-02-03 DIAGNOSIS — Z5111 Encounter for antineoplastic chemotherapy: Secondary | ICD-10-CM | POA: Diagnosis not present

## 2018-02-03 LAB — CMP (CANCER CENTER ONLY)
ALT: 40 U/L (ref 10–47)
AST: 31 U/L (ref 11–38)
Albumin: 3.9 g/dL (ref 3.5–5.0)
Alkaline Phosphatase: 58 U/L (ref 26–84)
Anion gap: 1 — ABNORMAL LOW (ref 5–15)
BUN: 18 mg/dL (ref 7–22)
CO2: 25 mmol/L (ref 18–33)
Calcium: 8.9 mg/dL (ref 8.0–10.3)
Chloride: 112 mmol/L — ABNORMAL HIGH (ref 98–108)
Creatinine: 1.4 mg/dL — ABNORMAL HIGH (ref 0.60–1.20)
Glucose, Bld: 103 mg/dL (ref 73–118)
Potassium: 4.2 mmol/L (ref 3.3–4.7)
Sodium: 138 mmol/L (ref 128–145)
Total Bilirubin: 0.7 mg/dL (ref 0.2–1.6)
Total Protein: 7.3 g/dL (ref 6.4–8.1)

## 2018-02-03 LAB — CBC WITH DIFFERENTIAL (CANCER CENTER ONLY)
Abs Immature Granulocytes: 0 10*3/uL (ref 0.00–0.07)
Basophils Absolute: 0 10*3/uL (ref 0.0–0.1)
Basophils Relative: 0 %
Eosinophils Absolute: 0 10*3/uL (ref 0.0–0.5)
Eosinophils Relative: 1 %
HCT: 36.9 % — ABNORMAL LOW (ref 39.0–52.0)
Hemoglobin: 12.9 g/dL — ABNORMAL LOW (ref 13.0–17.0)
Immature Granulocytes: 0 %
Lymphocytes Relative: 25 %
Lymphs Abs: 1 10*3/uL (ref 0.7–4.0)
MCH: 35.1 pg — ABNORMAL HIGH (ref 26.0–34.0)
MCHC: 35 g/dL (ref 30.0–36.0)
MCV: 100.5 fL — ABNORMAL HIGH (ref 80.0–100.0)
Monocytes Absolute: 0.3 10*3/uL (ref 0.1–1.0)
Monocytes Relative: 7 %
Neutro Abs: 2.8 10*3/uL (ref 1.7–7.7)
Neutrophils Relative %: 67 %
Platelet Count: 164 10*3/uL (ref 150–400)
RBC: 3.67 MIL/uL — ABNORMAL LOW (ref 4.22–5.81)
RDW: 12.6 % (ref 11.5–15.5)
WBC Count: 4.2 10*3/uL (ref 4.0–10.5)
nRBC: 0 % (ref 0.0–0.2)

## 2018-02-03 MED ORDER — BORTEZOMIB CHEMO SQ INJECTION 3.5 MG (2.5MG/ML)
3.0000 mg | Freq: Once | INTRAMUSCULAR | Status: AC
  Administered 2018-02-03: 3 mg via SUBCUTANEOUS
  Filled 2018-02-03: qty 1.2

## 2018-02-03 NOTE — Progress Notes (Signed)
Hematology and Oncology Follow Up Visit  Tyler Phillips [id number] Mar 25, 1966 52 y.o. 02/03/2018   Principle Diagnosis:  IgG Kappa myeloma - +4, +14, +17  Current Therapy:   Status post autologous stem cell transplant on 05/22/2015  S/p cycle 6 of RVD  Velcade q 2wk dosing/Revlimid 10mg  po q day (21/7) -Revlimid on hold starting 12/02/2017   Interim History:  Tyler Phillips is here today for follow-up. He is doing well but still having the increased numbness and tingling in his feet. This has not effected his balance or gait.  He is still running and doing crossfit.  Hgb is now 12.9, MCV 100.5, WBC count 4.2, platelet count is 164. LFT"s have returned to normal.  No fever, chills, n/v, cough, rash, dizziness, SOB, chest pain, palpitations, abdominal pain or changes in bowel or bladder habits.  No swelling or tenderness in his extremities at this time.  No lymphadenopathy noted on exam.  No episodes of bleeding, no bruising or petechiae.  He has maintained a good appetite and is staying well hydrated. His weight is stable.   ECOG Performance Status: 1 - Symptomatic but completely ambulatory  Medications:  Allergies as of 02/03/2018   No Known Allergies     Medication List        Accurate as of 02/03/18  4:18 PM. Always use your most recent med list.          aspirin 325 MG tablet Take 325 mg by mouth daily.   bortezomib IV 3.5 MG injection Commonly known as:  VELCADE Inject as directed every 14 (fourteen) days.   cetirizine 10 MG tablet Commonly known as:  ZYRTEC Take 10 mg by mouth daily.   clindamycin 300 MG capsule Commonly known as:  CLEOCIN Take 600 mg by mouth See admin instructions. 1 hour prior to dental appts   lenalidomide 10 MG capsule Commonly known as:  REVLIMID Take 1 capsule daily for 21 days on and 7 days off. [id number]   LORazepam 1 MG tablet Commonly known as:  ATIVAN Take 1 mg by mouth every 4 (four) hours as needed.   oxyCODONE 5 MG  immediate release tablet Commonly known as:  Oxy IR/ROXICODONE Take 5 mg by mouth every 4 (four) hours as needed.   penicillin v potassium 500 MG tablet Commonly known as:  VEETID TAKE ONE TABLET (500 MG TOTAL) BY MOUTH EVERY 6 HOURS FOR 7 DAYS.   prochlorperazine 10 MG tablet Commonly known as:  COMPAZINE Take 10 mg by mouth every 6 (six) hours as needed.   valACYclovir 500 MG tablet Commonly known as:  VALTREX Take 500 mg by mouth 2 (two) times daily.   Vitamin D3 1000 units Caps Take by mouth.       Allergies: No Known Allergies  Past Medical History, Surgical history, Social history, and Family History were reviewed and updated.  Review of Systems: All other 10 point review of systems is negative.   Physical Exam:  weight is 224 lb 12.8 oz (102 kg). His oral temperature is 97.7 F (36.5 C). His blood pressure is 154/87 (abnormal) and his pulse is 66. His respiration is 18 and oxygen saturation is 100%.   Wt Readings from Last 3 Encounters:  02/03/18 224 lb 12.8 oz (102 kg)  01/20/18 223 lb (101.2 kg)  12/16/17 219 lb (99.3 kg)    Ocular: Sclerae unicteric, pupils equal, round and reactive to light Ear-nose-throat: Oropharynx clear, dentition fair Lymphatic: No cervical, supraclavicular or axillary adenopathy Lungs no  rales or rhonchi, good excursion bilaterally Heart regular rate and rhythm, no murmur appreciated Abd soft, nontender, positive bowel sounds, no liver or spleen tip palpated on exam, no fluid wave  MSK no focal spinal tenderness, no joint edema Neuro: non-focal, well-oriented, appropriate affect Breasts: Deferred   Lab Results  Component Value Date   WBC 4.2 02/03/2018   HGB 12.9 (L) 02/03/2018   HCT 36.9 (L) 02/03/2018   MCV 100.5 (H) 02/03/2018   PLT 164 02/03/2018   Lab Results  Component Value Date   FERRITIN 1,263 (H) 07/20/2014   IRON 125 07/20/2014   TIBC 198 (L) 07/20/2014   UIBC 73 (L) 07/20/2014   IRONPCTSAT 63 (H) 07/20/2014     Lab Results  Component Value Date   RETICCTPCT 0.8 07/20/2014   RBC 3.67 (L) 02/03/2018   Lab Results  Component Value Date   KPAFRELGTCHN 22.6 (H) 01/06/2018   LAMBDASER 10.0 01/06/2018   KAPLAMBRATIO 2.26 (H) 01/06/2018   Lab Results  Component Value Date   IGGSERUM 1,326 01/06/2018   IGA 150 01/06/2018   IGMSERUM 21 01/06/2018   Lab Results  Component Value Date   TOTALPROTELP 7.2 01/06/2018   ALBUMINELP 4.2 01/06/2018   A1GS 0.1 01/06/2018   A2GS 0.6 01/06/2018   BETS 0.9 01/06/2018   BETA2SER 0.3 03/28/2015   GAMS 1.3 01/06/2018   MSPIKE Not Observed 01/06/2018   SPEI * 03/28/2015     Chemistry      Component Value Date/Time   NA 138 02/03/2018 1401   NA 140 04/22/2017 1140   NA 138 09/03/2015 1042   K 4.2 02/03/2018 1401   K 4.0 04/22/2017 1140   K 4.3 09/03/2015 1042   CL 112 (H) 02/03/2018 1401   CL 105 04/22/2017 1140   CO2 25 02/03/2018 1401   CO2 24 04/22/2017 1140   CO2 25 09/03/2015 1042   BUN 18 02/03/2018 1401   BUN 16 04/22/2017 1140   BUN 21.7 09/03/2015 1042   CREATININE 1.40 (H) 02/03/2018 1401   CREATININE 1.6 (H) 04/22/2017 1140   CREATININE 1.2 09/03/2015 1042      Component Value Date/Time   CALCIUM 8.9 02/03/2018 1401   CALCIUM 8.5 04/22/2017 1140   CALCIUM 9.1 09/03/2015 1042   ALKPHOS 58 02/03/2018 1401   ALKPHOS 59 04/22/2017 1140   ALKPHOS 42 09/03/2015 1042   AST 31 02/03/2018 1401   AST 27 09/03/2015 1042   ALT 40 02/03/2018 1401   ALT 45 04/22/2017 1140   ALT 41 09/03/2015 1042   BILITOT 0.7 02/03/2018 1401   BILITOT 0.54 09/03/2015 1042      Impression and Plan: Tyler Phillips is a pleasant 52 yo caucasian gentleman with IgG kappa myeloma s/p autologous stem cell transplant in February 2017.  His counts look good today. LFT's have returned to normal.  No M-spike observed in September. Today's myeloma studies are pending.  We will proceed with Velcade today.  I spoke with Dr. Maylon Peppers and we will have him restart his  Revlimid.  We will see him back in another 2 weeks for follow-up.  He will contact our office with any questions or concerns. We can certainly see him sooner if need be.   Laverna Peace, NP 10/18/20194:18 PM

## 2018-02-03 NOTE — Patient Instructions (Signed)
[facility] Discharge Instructions for Patients Receiving Chemotherapy  Today you received the following chemotherapy agents:  velcade  To help prevent nausea and vomiting after your treatment, we encourage you to take your nausea medication as ordered per MD.    If you develop nausea and vomiting that is not controlled by your nausea medication, call the clinic.   BELOW ARE SYMPTOMS THAT SHOULD BE REPORTED IMMEDIATELY:  *FEVER GREATER THAN 100.5 F  *CHILLS WITH OR WITHOUT FEVER  NAUSEA AND VOMITING THAT IS NOT CONTROLLED WITH YOUR NAUSEA MEDICATION  *UNUSUAL SHORTNESS OF BREATH  *UNUSUAL BRUISING OR BLEEDING  TENDERNESS IN MOUTH AND THROAT WITH OR WITHOUT PRESENCE OF ULCERS  *URINARY PROBLEMS  *BOWEL PROBLEMS  UNUSUAL RASH Items with * indicate a potential emergency and should be followed up as soon as possible.  Feel free to call the clinic should you have any questions or concerns. The clinic phone number is [phone].  Please show the [place] at check-in to the Emergency Department and triage nurse.

## 2018-02-04 LAB — IGG, IGA, IGM
IgA: 135 mg/dL (ref 90–386)
IgG (Immunoglobin G), Serum: 1302 mg/dL (ref 700–1600)
IgM (Immunoglobulin M), Srm: 20 mg/dL (ref 20–172)

## 2018-02-06 LAB — KAPPA/LAMBDA LIGHT CHAINS
Kappa free light chain: 18.9 mg/L (ref 3.3–19.4)
Kappa, lambda light chain ratio: 1.43 (ref 0.26–1.65)
Lambda free light chains: 13.2 mg/L (ref 5.7–26.3)

## 2018-02-06 LAB — PROTEIN ELECTROPHORESIS, SERUM
A/G Ratio: 1.3 (ref 0.7–1.7)
Albumin ELP: 3.9 g/dL (ref 2.9–4.4)
Alpha-1-Globulin: 0.2 g/dL (ref 0.0–0.4)
Alpha-2-Globulin: 0.6 g/dL (ref 0.4–1.0)
Beta Globulin: 1 g/dL (ref 0.7–1.3)
Gamma Globulin: 1.3 g/dL (ref 0.4–1.8)
Globulin, Total: 3 g/dL (ref 2.2–3.9)
Total Protein ELP: 6.9 g/dL (ref 6.0–8.5)

## 2018-02-07 DIAGNOSIS — D123 Benign neoplasm of transverse colon: Secondary | ICD-10-CM | POA: Diagnosis not present

## 2018-02-07 DIAGNOSIS — Z1211 Encounter for screening for malignant neoplasm of colon: Secondary | ICD-10-CM | POA: Diagnosis not present

## 2018-02-07 DIAGNOSIS — K5730 Diverticulosis of large intestine without perforation or abscess without bleeding: Secondary | ICD-10-CM | POA: Diagnosis not present

## 2018-02-10 DIAGNOSIS — Z1211 Encounter for screening for malignant neoplasm of colon: Secondary | ICD-10-CM | POA: Diagnosis not present

## 2018-02-10 DIAGNOSIS — D123 Benign neoplasm of transverse colon: Secondary | ICD-10-CM | POA: Diagnosis not present

## 2018-02-15 DIAGNOSIS — Z0000 Encounter for general adult medical examination without abnormal findings: Secondary | ICD-10-CM | POA: Diagnosis not present

## 2018-02-15 DIAGNOSIS — Z125 Encounter for screening for malignant neoplasm of prostate: Secondary | ICD-10-CM | POA: Diagnosis not present

## 2018-02-15 DIAGNOSIS — Z13220 Encounter for screening for lipoid disorders: Secondary | ICD-10-CM | POA: Diagnosis not present

## 2018-02-15 DIAGNOSIS — Z131 Encounter for screening for diabetes mellitus: Secondary | ICD-10-CM | POA: Diagnosis not present

## 2018-02-17 ENCOUNTER — Inpatient Hospital Stay: Payer: BLUE CROSS/BLUE SHIELD

## 2018-02-17 ENCOUNTER — Inpatient Hospital Stay: Payer: BLUE CROSS/BLUE SHIELD | Attending: Hematology & Oncology

## 2018-02-17 VITALS — BP 135/89 | HR 57 | Temp 97.7°F | Resp 18

## 2018-02-17 DIAGNOSIS — C9001 Multiple myeloma in remission: Secondary | ICD-10-CM

## 2018-02-17 DIAGNOSIS — Z79899 Other long term (current) drug therapy: Secondary | ICD-10-CM | POA: Diagnosis not present

## 2018-02-17 DIAGNOSIS — Z5112 Encounter for antineoplastic immunotherapy: Secondary | ICD-10-CM | POA: Diagnosis not present

## 2018-02-17 DIAGNOSIS — Z9484 Stem cells transplant status: Secondary | ICD-10-CM | POA: Diagnosis not present

## 2018-02-17 LAB — CBC WITH DIFFERENTIAL (CANCER CENTER ONLY)
Abs Immature Granulocytes: 0.02 10*3/uL (ref 0.00–0.07)
Basophils Absolute: 0 10*3/uL (ref 0.0–0.1)
Basophils Relative: 0 %
Eosinophils Absolute: 0.1 10*3/uL (ref 0.0–0.5)
Eosinophils Relative: 1 %
HCT: 38.3 % — ABNORMAL LOW (ref 39.0–52.0)
Hemoglobin: 13.4 g/dL (ref 13.0–17.0)
Immature Granulocytes: 0 %
Lymphocytes Relative: 16 %
Lymphs Abs: 0.8 10*3/uL (ref 0.7–4.0)
MCH: 35.7 pg — ABNORMAL HIGH (ref 26.0–34.0)
MCHC: 35 g/dL (ref 30.0–36.0)
MCV: 102.1 fL — ABNORMAL HIGH (ref 80.0–100.0)
Monocytes Absolute: 0.6 10*3/uL (ref 0.1–1.0)
Monocytes Relative: 11 %
Neutro Abs: 3.6 10*3/uL (ref 1.7–7.7)
Neutrophils Relative %: 72 %
Platelet Count: 149 10*3/uL — ABNORMAL LOW (ref 150–400)
RBC: 3.75 MIL/uL — ABNORMAL LOW (ref 4.22–5.81)
RDW: 12.6 % (ref 11.5–15.5)
WBC Count: 5 10*3/uL (ref 4.0–10.5)
nRBC: 0 % (ref 0.0–0.2)

## 2018-02-17 LAB — CMP (CANCER CENTER ONLY)
ALT: 62 U/L — ABNORMAL HIGH (ref 10–47)
AST: 36 U/L (ref 11–38)
Albumin: 4 g/dL (ref 3.5–5.0)
Alkaline Phosphatase: 60 U/L (ref 26–84)
Anion gap: 7 (ref 5–15)
BUN: 11 mg/dL (ref 7–22)
CO2: 26 mmol/L (ref 18–33)
Calcium: 8.7 mg/dL (ref 8.0–10.3)
Chloride: 107 mmol/L (ref 98–108)
Creatinine: 1.1 mg/dL (ref 0.60–1.20)
Glucose, Bld: 92 mg/dL (ref 73–118)
Potassium: 4 mmol/L (ref 3.3–4.7)
Sodium: 140 mmol/L (ref 128–145)
Total Bilirubin: 0.9 mg/dL (ref 0.2–1.6)
Total Protein: 7.3 g/dL (ref 6.4–8.1)

## 2018-02-17 MED ORDER — BORTEZOMIB CHEMO SQ INJECTION 3.5 MG (2.5MG/ML)
3.0000 mg | Freq: Once | INTRAMUSCULAR | Status: AC
  Administered 2018-02-17: 3 mg via SUBCUTANEOUS
  Filled 2018-02-17: qty 1.2

## 2018-02-17 MED ORDER — DEXTROSE 5 % IV SOLN
INTRAVENOUS | Status: DC
  Filled 2018-02-17: qty 250

## 2018-02-17 NOTE — Patient Instructions (Signed)

## 2018-03-01 ENCOUNTER — Other Ambulatory Visit: Payer: Self-pay | Admitting: *Deleted

## 2018-03-01 DIAGNOSIS — C9002 Multiple myeloma in relapse: Secondary | ICD-10-CM

## 2018-03-01 MED ORDER — LENALIDOMIDE 10 MG PO CAPS: ORAL_CAPSULE | ORAL | 0 refills | Status: DC

## 2018-03-03 ENCOUNTER — Other Ambulatory Visit: Payer: Self-pay

## 2018-03-03 ENCOUNTER — Inpatient Hospital Stay: Payer: BLUE CROSS/BLUE SHIELD

## 2018-03-03 ENCOUNTER — Encounter: Payer: Self-pay | Admitting: Hematology & Oncology

## 2018-03-03 ENCOUNTER — Inpatient Hospital Stay (HOSPITAL_BASED_OUTPATIENT_CLINIC_OR_DEPARTMENT_OTHER): Payer: BLUE CROSS/BLUE SHIELD | Admitting: Hematology & Oncology

## 2018-03-03 ENCOUNTER — Telehealth: Payer: Self-pay | Admitting: Hematology & Oncology

## 2018-03-03 VITALS — BP 144/91 | HR 67 | Temp 97.7°F | Resp 18 | Wt 227.0 lb

## 2018-03-03 DIAGNOSIS — Z79899 Other long term (current) drug therapy: Secondary | ICD-10-CM

## 2018-03-03 DIAGNOSIS — Z5112 Encounter for antineoplastic immunotherapy: Secondary | ICD-10-CM | POA: Diagnosis not present

## 2018-03-03 DIAGNOSIS — C9001 Multiple myeloma in remission: Secondary | ICD-10-CM | POA: Diagnosis not present

## 2018-03-03 DIAGNOSIS — C9000 Multiple myeloma not having achieved remission: Secondary | ICD-10-CM

## 2018-03-03 DIAGNOSIS — Z9484 Stem cells transplant status: Secondary | ICD-10-CM

## 2018-03-03 LAB — CMP (CANCER CENTER ONLY)
ALT: 67 U/L — ABNORMAL HIGH (ref 10–47)
AST: 39 U/L — ABNORMAL HIGH (ref 11–38)
Albumin: 3.8 g/dL (ref 3.5–5.0)
Alkaline Phosphatase: 55 U/L (ref 26–84)
Anion gap: 8 (ref 5–15)
BUN: 15 mg/dL (ref 7–22)
CO2: 25 mmol/L (ref 18–33)
Calcium: 9.2 mg/dL (ref 8.0–10.3)
Chloride: 109 mmol/L — ABNORMAL HIGH (ref 98–108)
Creatinine: 1.7 mg/dL — ABNORMAL HIGH (ref 0.60–1.20)
Glucose, Bld: 88 mg/dL (ref 73–118)
Potassium: 4.3 mmol/L (ref 3.3–4.7)
Sodium: 142 mmol/L (ref 128–145)
Total Bilirubin: 0.8 mg/dL (ref 0.2–1.6)
Total Protein: 7 g/dL (ref 6.4–8.1)

## 2018-03-03 LAB — CBC WITH DIFFERENTIAL (CANCER CENTER ONLY)
Abs Immature Granulocytes: 0.01 10*3/uL (ref 0.00–0.07)
Basophils Absolute: 0 10*3/uL (ref 0.0–0.1)
Basophils Relative: 1 %
Eosinophils Absolute: 0 10*3/uL (ref 0.0–0.5)
Eosinophils Relative: 1 %
HCT: 36.8 % — ABNORMAL LOW (ref 39.0–52.0)
Hemoglobin: 12.9 g/dL — ABNORMAL LOW (ref 13.0–17.0)
Immature Granulocytes: 0 %
Lymphocytes Relative: 14 %
Lymphs Abs: 0.7 10*3/uL (ref 0.7–4.0)
MCH: 35.5 pg — ABNORMAL HIGH (ref 26.0–34.0)
MCHC: 35.1 g/dL (ref 30.0–36.0)
MCV: 101.4 fL — ABNORMAL HIGH (ref 80.0–100.0)
Monocytes Absolute: 0.5 10*3/uL (ref 0.1–1.0)
Monocytes Relative: 10 %
Neutro Abs: 3.8 10*3/uL (ref 1.7–7.7)
Neutrophils Relative %: 74 %
Platelet Count: 155 10*3/uL (ref 150–400)
RBC: 3.63 MIL/uL — ABNORMAL LOW (ref 4.22–5.81)
RDW: 12.7 % (ref 11.5–15.5)
WBC Count: 5.1 10*3/uL (ref 4.0–10.5)
nRBC: 0 % (ref 0.0–0.2)

## 2018-03-03 MED ORDER — BORTEZOMIB CHEMO SQ INJECTION 3.5 MG (2.5MG/ML)
3.0000 mg | Freq: Once | INTRAMUSCULAR | Status: AC
  Administered 2018-03-03: 3 mg via SUBCUTANEOUS
  Filled 2018-03-03: qty 1.2

## 2018-03-03 NOTE — Progress Notes (Signed)
Hematology and Oncology Follow Up Visit  Tyler Phillips [id number] 01-Nov-1965 52 y.o. 03/03/2018   Principle Diagnosis:  IgG Kappa myeloma - +4, +14, +17  Current Therapy:   Status post autologous stem cell transplant on 05/22/2015  S/p cycle 6 of RVD  Velcade q 2wk dosing/Revlimid 10mg  po q day (21/7)    Interim History:  Tyler Phillips is here today for follow-up.  So far, he has been doing okay.  Work has been a little bit slow but he does not mind this at all.  He and his family will be driving up to [place] for Thanksgiving.  He is looking forward to this.  He now is back on Revlimid.  He has had no problems with the Revlimid.  His liver function studies have been doing pretty well.  Only last saw him, there was no monoclonal spike in his blood.  He has had no fever.  He has had no cough.  He has had no change in bowel or bladder habits.  He has had no diarrhea.  He has had no rashes.  Overall, his performance status is ECOG 0.    Medications:  Allergies as of 03/03/2018   No Known Allergies     Medication List        Accurate as of 03/03/18 10:21 AM. Always use your most recent med list.          aspirin 325 MG tablet Take 325 mg by mouth daily.   bortezomib IV 3.5 MG injection Commonly known as:  VELCADE Inject as directed every 14 (fourteen) days.   cetirizine 10 MG tablet Commonly known as:  ZYRTEC Take 10 mg by mouth daily.   clindamycin 300 MG capsule Commonly known as:  CLEOCIN Take 600 mg by mouth See admin instructions. 1 hour prior to dental appts   lenalidomide 10 MG capsule Commonly known as:  REVLIMID Take 1 capsule by mouth daily for 21 days on and 7 days off. Auth. # is [id number]. Ht 73 in. Wt 224 lb.   LORazepam 1 MG tablet Commonly known as:  ATIVAN Take 1 mg by mouth every 4 (four) hours as needed.   oxyCODONE 5 MG immediate release tablet Commonly known as:  Oxy IR/ROXICODONE Take 5 mg by mouth every 4 (four) hours as needed.     penicillin v potassium 500 MG tablet Commonly known as:  VEETID TAKE ONE TABLET (500 MG TOTAL) BY MOUTH EVERY 6 HOURS FOR 7 DAYS.   prochlorperazine 10 MG tablet Commonly known as:  COMPAZINE Take 10 mg by mouth every 6 (six) hours as needed.   valACYclovir 500 MG tablet Commonly known as:  VALTREX Take 500 mg by mouth 2 (two) times daily.   Vitamin D3 25 MCG (1000 UT) Caps Take by mouth.       Allergies: No Known Allergies  Past Medical History, Surgical history, Social history, and Family History were reviewed and updated.  Review of Systems: Review of Systems  Constitutional: Negative.   HENT: Negative.   Eyes: Negative.   Respiratory: Negative.   Cardiovascular: Negative.   Gastrointestinal: Negative.   Genitourinary: Negative.   Musculoskeletal: Negative.   Skin: Negative.   Neurological: Negative.   Endo/Heme/Allergies: Negative.   Psychiatric/Behavioral: Negative.      Physical Exam:  weight is 227 lb (103 kg). His oral temperature is 97.7 F (36.5 C). His blood pressure is 144/91 (abnormal) and his pulse is 67. His respiration is 18 and oxygen saturation is  99%.   Wt Readings from Last 3 Encounters:  03/03/18 227 lb (103 kg)  02/03/18 224 lb 12.8 oz (102 kg)  01/20/18 223 lb (101.2 kg)    Physical Exam  Constitutional: He is oriented to person, place, and time.  HENT:  Head: Normocephalic and atraumatic.  Mouth/Throat: Oropharynx is clear and moist.  Eyes: Pupils are equal, round, and reactive to light. EOM are normal.  Neck: Normal range of motion.  Cardiovascular: Normal rate, regular rhythm and normal heart sounds.  Pulmonary/Chest: Effort normal and breath sounds normal.  Abdominal: Soft. Bowel sounds are normal.  Musculoskeletal: Normal range of motion. He exhibits no edema, tenderness or deformity.  Lymphadenopathy:    He has no cervical adenopathy.  Neurological: He is alert and oriented to person, place, and time.  Skin: Skin is warm  and dry. No rash noted. No erythema.  Psychiatric: He has a normal mood and affect. His behavior is normal. Judgment and thought content normal.  Vitals reviewed.    Lab Results  Component Value Date   WBC 5.1 03/03/2018   HGB 12.9 (L) 03/03/2018   HCT 36.8 (L) 03/03/2018   MCV 101.4 (H) 03/03/2018   PLT 155 03/03/2018   Lab Results  Component Value Date   FERRITIN 1,263 (H) 07/20/2014   IRON 125 07/20/2014   TIBC 198 (L) 07/20/2014   UIBC 73 (L) 07/20/2014   IRONPCTSAT 63 (H) 07/20/2014   Lab Results  Component Value Date   RETICCTPCT 0.8 07/20/2014   RBC 3.63 (L) 03/03/2018   Lab Results  Component Value Date   KPAFRELGTCHN 18.9 02/03/2018   LAMBDASER 13.2 02/03/2018   KAPLAMBRATIO 1.43 02/03/2018   Lab Results  Component Value Date   IGGSERUM 1,302 02/03/2018   IGA 135 02/03/2018   IGMSERUM 20 02/03/2018   Lab Results  Component Value Date   TOTALPROTELP 6.9 02/03/2018   ALBUMINELP 3.9 02/03/2018   A1GS 0.2 02/03/2018   A2GS 0.6 02/03/2018   BETS 1.0 02/03/2018   BETA2SER 0.3 03/28/2015   GAMS 1.3 02/03/2018   MSPIKE Not Observed 02/03/2018   SPEI Comment 02/03/2018     Chemistry      Component Value Date/Time   NA 142 03/03/2018 0912   NA 140 04/22/2017 1140   NA 138 09/03/2015 1042   K 4.3 03/03/2018 0912   K 4.0 04/22/2017 1140   K 4.3 09/03/2015 1042   CL 109 (H) 03/03/2018 0912   CL 105 04/22/2017 1140   CO2 25 03/03/2018 0912   CO2 24 04/22/2017 1140   CO2 25 09/03/2015 1042   BUN 15 03/03/2018 0912   BUN 16 04/22/2017 1140   BUN 21.7 09/03/2015 1042   CREATININE 1.70 (H) 03/03/2018 0912   CREATININE 1.6 (H) 04/22/2017 1140   CREATININE 1.2 09/03/2015 1042      Component Value Date/Time   CALCIUM 9.2 03/03/2018 0912   CALCIUM 8.5 04/22/2017 1140   CALCIUM 9.1 09/03/2015 1042   ALKPHOS 55 03/03/2018 0912   ALKPHOS 59 04/22/2017 1140   ALKPHOS 42 09/03/2015 1042   AST 39 (H) 03/03/2018 0912   AST 27 09/03/2015 1042   ALT 67 (H)  03/03/2018 0912   ALT 45 04/22/2017 1140   ALT 41 09/03/2015 1042   BILITOT 0.8 03/03/2018 0912   BILITOT 0.54 09/03/2015 1042      Impression and Plan: Tyler Phillips is a pleasant 52 yo caucasian gentleman with IgG kappa myeloma.  He underwent induction chemotherapy with RVD.  He then underwent an autologous stem cell transplant at [facility] on February 2017.  He is on maintenance therapy.  He is doing well with maintenance therapy.  I see no issues at all with myeloma recurring.  We will continue to treat him every 2 weeks with the Velcade.  Hopefully, at some point, we might be able to do move his Velcade treatments to every 3 weeks.  I will plan to get him back to see [place] in another month for follow-up.    [name], MD 11/15/201910:21 AM

## 2018-03-03 NOTE — Progress Notes (Signed)
Ok to treat with creatinine of 1.7 per Dr Ennever. dph 

## 2018-03-03 NOTE — Telephone Encounter (Signed)
sw with pt to confirm next set of appts. Pt informed me that he will be traveling for work 11/29 and wanted to sch his lab/chemo inj on 12/10 at 1130 am

## 2018-03-03 NOTE — Patient Instructions (Signed)

## 2018-03-04 LAB — IGG, IGA, IGM
IgA: 134 mg/dL (ref 90–386)
IgG (Immunoglobin G), Serum: 1243 mg/dL (ref 700–1600)
IgM (Immunoglobulin M), Srm: 20 mg/dL (ref 20–172)

## 2018-03-06 LAB — PROTEIN ELECTROPHORESIS, SERUM
A/G Ratio: 1.4 (ref 0.7–1.7)
Albumin ELP: 3.9 g/dL (ref 2.9–4.4)
Alpha-1-Globulin: 0.1 g/dL (ref 0.0–0.4)
Alpha-2-Globulin: 0.6 g/dL (ref 0.4–1.0)
Beta Globulin: 0.8 g/dL (ref 0.7–1.3)
Gamma Globulin: 1.2 g/dL (ref 0.4–1.8)
Globulin, Total: 2.7 g/dL (ref 2.2–3.9)
Total Protein ELP: 6.6 g/dL (ref 6.0–8.5)

## 2018-03-06 LAB — KAPPA/LAMBDA LIGHT CHAINS
Kappa free light chain: 26.1 mg/L — ABNORMAL HIGH (ref 3.3–19.4)
Kappa, lambda light chain ratio: 1.46 (ref 0.26–1.65)
Lambda free light chains: 17.9 mg/L (ref 5.7–26.3)

## 2018-03-17 ENCOUNTER — Inpatient Hospital Stay: Payer: BLUE CROSS/BLUE SHIELD

## 2018-03-17 ENCOUNTER — Other Ambulatory Visit: Payer: BLUE CROSS/BLUE SHIELD

## 2018-03-21 ENCOUNTER — Other Ambulatory Visit: Payer: Self-pay | Admitting: *Deleted

## 2018-03-21 DIAGNOSIS — C9002 Multiple myeloma in relapse: Secondary | ICD-10-CM

## 2018-03-21 MED ORDER — LENALIDOMIDE 10 MG PO CAPS: ORAL_CAPSULE | ORAL | 0 refills | Status: DC

## 2018-03-24 ENCOUNTER — Other Ambulatory Visit: Payer: Self-pay | Admitting: *Deleted

## 2018-03-24 DIAGNOSIS — C9002 Multiple myeloma in relapse: Secondary | ICD-10-CM

## 2018-03-24 MED ORDER — LENALIDOMIDE 10 MG PO CAPS: ORAL_CAPSULE | ORAL | 0 refills | Status: DC

## 2018-03-28 ENCOUNTER — Encounter: Payer: Self-pay | Admitting: Hematology

## 2018-03-28 ENCOUNTER — Inpatient Hospital Stay: Payer: BLUE CROSS/BLUE SHIELD | Attending: Hematology & Oncology

## 2018-03-28 ENCOUNTER — Inpatient Hospital Stay: Payer: BLUE CROSS/BLUE SHIELD

## 2018-03-28 ENCOUNTER — Telehealth: Payer: Self-pay | Admitting: Hematology & Oncology

## 2018-03-28 ENCOUNTER — Inpatient Hospital Stay (HOSPITAL_BASED_OUTPATIENT_CLINIC_OR_DEPARTMENT_OTHER): Payer: BLUE CROSS/BLUE SHIELD | Admitting: Hematology

## 2018-03-28 ENCOUNTER — Telehealth: Payer: Self-pay | Admitting: Hematology

## 2018-03-28 ENCOUNTER — Ambulatory Visit: Payer: BLUE CROSS/BLUE SHIELD | Admitting: Hematology & Oncology

## 2018-03-28 VITALS — BP 141/82 | HR 67 | Temp 97.9°F | Resp 17

## 2018-03-28 VITALS — BP 141/82 | HR 67 | Temp 97.9°F | Resp 17 | Ht 73.0 in | Wt 226.0 lb

## 2018-03-28 DIAGNOSIS — D72819 Decreased white blood cell count, unspecified: Secondary | ICD-10-CM | POA: Diagnosis not present

## 2018-03-28 DIAGNOSIS — C9001 Multiple myeloma in remission: Secondary | ICD-10-CM

## 2018-03-28 DIAGNOSIS — D702 Other drug-induced agranulocytosis: Secondary | ICD-10-CM

## 2018-03-28 DIAGNOSIS — Z79899 Other long term (current) drug therapy: Secondary | ICD-10-CM | POA: Diagnosis not present

## 2018-03-28 DIAGNOSIS — D539 Nutritional anemia, unspecified: Secondary | ICD-10-CM | POA: Diagnosis not present

## 2018-03-28 DIAGNOSIS — Z9484 Stem cells transplant status: Secondary | ICD-10-CM | POA: Insufficient documentation

## 2018-03-28 DIAGNOSIS — G620 Drug-induced polyneuropathy: Secondary | ICD-10-CM | POA: Insufficient documentation

## 2018-03-28 DIAGNOSIS — Z5112 Encounter for antineoplastic immunotherapy: Secondary | ICD-10-CM | POA: Insufficient documentation

## 2018-03-28 DIAGNOSIS — T451X5A Adverse effect of antineoplastic and immunosuppressive drugs, initial encounter: Secondary | ICD-10-CM | POA: Insufficient documentation

## 2018-03-28 DIAGNOSIS — D6481 Anemia due to antineoplastic chemotherapy: Secondary | ICD-10-CM

## 2018-03-28 LAB — CMP (CANCER CENTER ONLY)
ALT: 66 U/L — ABNORMAL HIGH (ref 0–44)
AST: 38 U/L (ref 15–41)
Albumin: 4.4 g/dL (ref 3.5–5.0)
Alkaline Phosphatase: 51 U/L (ref 38–126)
Anion gap: 7 (ref 5–15)
BUN: 16 mg/dL (ref 6–20)
CO2: 25 mmol/L (ref 22–32)
Calcium: 8.6 mg/dL — ABNORMAL LOW (ref 8.9–10.3)
Chloride: 106 mmol/L (ref 98–111)
Creatinine: 1.37 mg/dL — ABNORMAL HIGH (ref 0.61–1.24)
GFR, Est AFR Am: >60 mL/min (ref >60)
GFR, Estimated: 58 mL/min — ABNORMAL LOW (ref >60)
Glucose, Bld: 94 mg/dL (ref 70–99)
Potassium: 4.1 mmol/L (ref 3.5–5.1)
Sodium: 138 mmol/L (ref 135–145)
Total Bilirubin: 0.5 mg/dL (ref 0.3–1.2)
Total Protein: 6.7 g/dL (ref 6.5–8.1)

## 2018-03-28 LAB — CBC WITH DIFFERENTIAL (CANCER CENTER ONLY)
Abs Immature Granulocytes: 0.01 10*3/uL (ref 0.00–0.07)
Basophils Absolute: 0 10*3/uL (ref 0.0–0.1)
Basophils Relative: 1 %
Eosinophils Absolute: 0.1 10*3/uL (ref 0.0–0.5)
Eosinophils Relative: 2 %
HCT: 38.1 % — ABNORMAL LOW (ref 39.0–52.0)
Hemoglobin: 12.9 g/dL — ABNORMAL LOW (ref 13.0–17.0)
Immature Granulocytes: 0 %
Lymphocytes Relative: 33 %
Lymphs Abs: 1 10*3/uL (ref 0.7–4.0)
MCH: 35.7 pg — ABNORMAL HIGH (ref 26.0–34.0)
MCHC: 33.9 g/dL (ref 30.0–36.0)
MCV: 105.5 fL — ABNORMAL HIGH (ref 80.0–100.0)
Monocytes Absolute: 0.3 10*3/uL (ref 0.1–1.0)
Monocytes Relative: 9 %
Neutro Abs: 1.6 10*3/uL — ABNORMAL LOW (ref 1.7–7.7)
Neutrophils Relative %: 55 %
Platelet Count: 151 10*3/uL (ref 150–400)
RBC: 3.61 MIL/uL — ABNORMAL LOW (ref 4.22–5.81)
RDW: 12.7 % (ref 11.5–15.5)
WBC Count: 3 10*3/uL — ABNORMAL LOW (ref 4.0–10.5)
nRBC: 0 % (ref 0.0–0.2)

## 2018-03-28 MED ORDER — BORTEZOMIB CHEMO SQ INJECTION 3.5 MG (2.5MG/ML)
3.0000 mg | Freq: Once | INTRAMUSCULAR | Status: AC
  Administered 2018-03-28: 3 mg via SUBCUTANEOUS
  Filled 2018-03-28: qty 1.2

## 2018-03-28 NOTE — Telephone Encounter (Signed)
Pt returned call to office about his 12/10 appts. Dr [name] will be ot of the office and patient did not want to resch his appts. Pt request to see Dr. Maylon Peppers today with lab/phlebotomy

## 2018-03-28 NOTE — Telephone Encounter (Signed)
No change in appts  Per 12/10 los

## 2018-03-28 NOTE — Patient Instructions (Signed)

## 2018-03-28 NOTE — Progress Notes (Signed)
[facility] OFFICE PROGRESS NOTE  Patient Care Team: [name], MD as PCP - General (Family Medicine)  Principle Diagnosis:  IgG Kappa myeloma - +4, +14, +17  Current Therapy:    S/p 6 cycles of RVD  Autologous stem cell transplant on 05/22/2015  Maintenance: Velcade q 2wk dosing/Revlimid 10mg  po q day (21/7)   ASSESSMENT AND PLAN:   IgG kappa myeloma -Patient is tolerating maintenance Velcade q2week and Revlimid relatively well except grade 1 neuropathy in bilateral feet -Myeloma labs pending -CBC and CMP appropriate to proceed with Velcade injection today -Patient scheduled to start the next cycle of maintenance Revlimid on 03/31/2018  Chemotherapy-associated neuropathy -Grade 1 in bilateral feet -No indication for changing treatment dose or frequency -We will monitor for now  Mild leukopenia without neutropenia -Secondary to chemotherapy -Patient denies any symptoms of infection -We will monitor for now; no indication for change in chemotherapy dose  Macrocytic anemia -Secondary to chemotherapy -Patient denies any symptoms associated with anemia -We will monitor for now  All questions were answered. The patient knows to call the clinic with any problems, questions or concerns. No barriers to learning was detected.  A total of more than 25 minutes were spent face-to-face with the patient during this encounter and over half of that time was spent on counseling and coordination of care as outlined above.   RTC on 04/14/2018 for labs and Velcade injection. RTC on 04/28/2018 for labs, Velcade injection and follow-up with Dr. [name].   Tish Men, MD 03/28/2018 1:39 PM  CHIEF COMPLAINT: "I am doing well"  INTERVAL HISTORY: [name] returns to clinic for maintenance Velcade injection.  He reports that he has been tolerating maintenance Revlimid and Velcade well, except intermittent mild numbness sensation in bilateral feet, mostly in the lateral toes.   He denies any interference with his daily activities.  He denies any fever, chill, night sweats, weight change, lymphadenopathy, abnormal bleeding or bruising, rash, or GI symptoms.  He is scheduled to start next cycle of maintenance Revlimid this Friday.  He is working full-time as a Chief Executive Officer for [employer].  SUMMARY OF ONCOLOGIC HISTORY:  No history exists.    REVIEW OF SYSTEMS:   Constitutional: ( - ) fevers, ( - )  chills , ( - ) night sweats Eyes: ( - ) blurriness of vision, ( - ) double vision, ( - ) watery eyes Ears, nose, mouth, throat, and face: ( - ) mucositis, ( - ) sore throat Respiratory: ( - ) cough, ( - ) dyspnea, ( - ) wheezes Cardiovascular: ( - ) palpitation, ( - ) chest discomfort, ( - ) lower extremity swelling Gastrointestinal:  ( - ) nausea, ( - ) heartburn, ( - ) change in bowel habits Skin: ( - ) abnormal skin rashes Lymphatics: ( - ) new lymphadenopathy, ( - ) easy bruising Neurological: ( + ) numbness, ( - ) tingling, ( - ) new weaknesses Behavioral/Psych: ( - ) mood change, ( - ) new changes  All other systems were reviewed with the patient and are negative.  I have reviewed the past medical history, past surgical history, social history and family history with the patient and they are unchanged from previous note.  ALLERGIES:  has No Known Allergies.  MEDICATIONS:  Current Outpatient Medications  Medication Sig Dispense Refill  . aspirin 325 MG tablet Take 325 mg by mouth daily.    . bortezomib IV (VELCADE) 3.5 MG injection Inject as directed every  14 (fourteen) days.    . cetirizine (ZYRTEC) 10 MG tablet Take 10 mg by mouth daily.    . Cholecalciferol (VITAMIN D3) 1000 units CAPS Take by mouth.    . clindamycin (CLEOCIN) 300 MG capsule Take 600 mg by mouth See admin instructions. 1 hour prior to dental appts    . lenalidomide (REVLIMID) 10 MG capsule Take 1 capsule by mouth daily for 21 days on and 7 days off. Auth. # is [id number]. Ht 73 in. Wt 224  lb. 21 capsule 0  . LORazepam (ATIVAN) 1 MG tablet Take 1 mg by mouth every 4 (four) hours as needed.    Marland Kitchen oxyCODONE (OXY IR/ROXICODONE) 5 MG immediate release tablet Take 5 mg by mouth every 4 (four) hours as needed.    . penicillin v potassium (VEETID) 500 MG tablet TAKE ONE TABLET (500 MG TOTAL) BY MOUTH EVERY 6 HOURS FOR 7 DAYS.  0  . prochlorperazine (COMPAZINE) 10 MG tablet Take 10 mg by mouth every 6 (six) hours as needed.    . valACYclovir (VALTREX) 500 MG tablet Take 500 mg by mouth 2 (two) times daily.      No current facility-administered medications for this visit.     PHYSICAL EXAMINATION: ECOG PERFORMANCE STATUS: 0 - Asymptomatic  There were no vitals filed for this visit. There is no height or weight on file to calculate BMI.  There were no vitals filed for this visit.  Wt Readings from Last 3 Encounters:  03/28/18 226 lb (102.5 kg)  03/03/18 227 lb (103 kg)  02/03/18 224 lb 12.8 oz (102 kg)   Temp Readings from Last 3 Encounters:  03/28/18 97.9 F (36.6 C) (Oral)  03/03/18 97.7 F (36.5 C) (Oral)  02/17/18 97.7 F (36.5 C) (Oral)   BP Readings from Last 3 Encounters:  03/28/18 (!) 141/82  03/03/18 (!) 144/91  02/17/18 135/89   Pulse Readings from Last 3 Encounters:  03/28/18 67  03/03/18 67  02/17/18 (!) 57    GENERAL: alert, no distress and comfortable SKIN: skin color, texture, turgor are normal, no rashes or significant lesions EYES: conjunctiva are pink and non-injected, sclera clear OROPHARYNX: no exudate, no erythema; lips, buccal mucosa, and tongue normal  NECK: supple, non-tender LYMPH:  no palpable lymphadenopathy in the cervical or axillary  LUNGS: clear to auscultation and percussion with normal breathing effort HEART: regular rate & rhythm and no murmurs and no lower extremity edema ABDOMEN: soft, non-tender, non-distended, normal bowel sounds Musculoskeletal: no cyanosis of digits and no clubbing  PSYCH: alert & oriented x 3, fluent  speech NEURO: no focal motor/sensory deficits  LABORATORY DATA:  I have reviewed the data as listed    Component Value Date/Time   NA 138 03/28/2018 1204   NA 140 04/22/2017 1140   NA 138 09/03/2015 1042   K 4.1 03/28/2018 1204   K 4.0 04/22/2017 1140   K 4.3 09/03/2015 1042   CL 106 03/28/2018 1204   CL 105 04/22/2017 1140   CO2 25 03/28/2018 1204   CO2 24 04/22/2017 1140   CO2 25 09/03/2015 1042   GLUCOSE 94 03/28/2018 1204   GLUCOSE 99 04/22/2017 1140   BUN 16 03/28/2018 1204   BUN 16 04/22/2017 1140   BUN 21.7 09/03/2015 1042   CREATININE 1.37 (H) 03/28/2018 1204   CREATININE 1.6 (H) 04/22/2017 1140   CREATININE 1.2 09/03/2015 1042   CALCIUM 8.6 (L) 03/28/2018 1204   CALCIUM 8.5 04/22/2017 1140   CALCIUM 9.1 09/03/2015  1042   PROT 6.7 03/28/2018 1204   PROT 6.9 04/22/2017 1140   PROT 6.4 09/03/2015 1042   ALBUMIN 4.4 03/28/2018 1204   ALBUMIN 3.7 04/22/2017 1140   ALBUMIN 4.0 09/03/2015 1042   AST 38 03/28/2018 1204   AST 27 09/03/2015 1042   ALT 66 (H) 03/28/2018 1204   ALT 45 04/22/2017 1140   ALT 41 09/03/2015 1042   ALKPHOS 51 03/28/2018 1204   ALKPHOS 59 04/22/2017 1140   ALKPHOS 42 09/03/2015 1042   BILITOT 0.5 03/28/2018 1204   BILITOT 0.54 09/03/2015 1042   GFRNONAA 58 (L) 03/28/2018 1204   GFRAA >60 03/28/2018 1204    Lab Results  Component Value Date   SPEP Comment 01/06/2018    Lab Results  Component Value Date   WBC 3.0 (L) 03/28/2018   NEUTROABS 1.6 (L) 03/28/2018   HGB 12.9 (L) 03/28/2018   HCT 38.1 (L) 03/28/2018   MCV 105.5 (H) 03/28/2018   PLT 151 03/28/2018      Chemistry      Component Value Date/Time   NA 138 03/28/2018 1204   NA 140 04/22/2017 1140   NA 138 09/03/2015 1042   K 4.1 03/28/2018 1204   K 4.0 04/22/2017 1140   K 4.3 09/03/2015 1042   CL 106 03/28/2018 1204   CL 105 04/22/2017 1140   CO2 25 03/28/2018 1204   CO2 24 04/22/2017 1140   CO2 25 09/03/2015 1042   BUN 16 03/28/2018 1204   BUN 16 04/22/2017  1140   BUN 21.7 09/03/2015 1042   CREATININE 1.37 (H) 03/28/2018 1204   CREATININE 1.6 (H) 04/22/2017 1140   CREATININE 1.2 09/03/2015 1042      Component Value Date/Time   CALCIUM 8.6 (L) 03/28/2018 1204   CALCIUM 8.5 04/22/2017 1140   CALCIUM 9.1 09/03/2015 1042   ALKPHOS 51 03/28/2018 1204   ALKPHOS 59 04/22/2017 1140   ALKPHOS 42 09/03/2015 1042   AST 38 03/28/2018 1204   AST 27 09/03/2015 1042   ALT 66 (H) 03/28/2018 1204   ALT 45 04/22/2017 1140   ALT 41 09/03/2015 1042   BILITOT 0.5 03/28/2018 1204   BILITOT 0.54 09/03/2015 1042

## 2018-03-29 LAB — KAPPA/LAMBDA LIGHT CHAINS
Kappa free light chain: 31.6 mg/L — ABNORMAL HIGH (ref 3.3–19.4)
Kappa, lambda light chain ratio: 1.5 (ref 0.26–1.65)
Lambda free light chains: 21.1 mg/L (ref 5.7–26.3)

## 2018-03-31 ENCOUNTER — Inpatient Hospital Stay: Payer: BLUE CROSS/BLUE SHIELD

## 2018-03-31 ENCOUNTER — Ambulatory Visit: Payer: BLUE CROSS/BLUE SHIELD | Admitting: Hematology & Oncology

## 2018-03-31 ENCOUNTER — Other Ambulatory Visit: Payer: BLUE CROSS/BLUE SHIELD

## 2018-04-03 LAB — MULTIPLE MYELOMA PANEL, SERUM
Albumin SerPl Elph-Mcnc: 4.1 g/dL (ref 2.9–4.4)
Albumin/Glob SerPl: 1.6 (ref 0.7–1.7)
Alpha 1: 0.1 g/dL (ref 0.0–0.4)
Alpha2 Glob SerPl Elph-Mcnc: 0.5 g/dL (ref 0.4–1.0)
B-Globulin SerPl Elph-Mcnc: 0.9 g/dL (ref 0.7–1.3)
Gamma Glob SerPl Elph-Mcnc: 1.1 g/dL (ref 0.4–1.8)
Globulin, Total: 2.6 g/dL (ref 2.2–3.9)
IgA: 140 mg/dL (ref 90–386)
IgG (Immunoglobin G), Serum: 1300 mg/dL (ref 700–1600)
IgM (Immunoglobulin M), Srm: 20 mg/dL (ref 20–172)
Total Protein ELP: 6.7 g/dL (ref 6.0–8.5)

## 2018-04-14 ENCOUNTER — Inpatient Hospital Stay: Payer: BLUE CROSS/BLUE SHIELD

## 2018-04-14 ENCOUNTER — Other Ambulatory Visit: Payer: Self-pay

## 2018-04-14 VITALS — BP 140/72 | HR 60 | Temp 98.0°F | Resp 18

## 2018-04-14 DIAGNOSIS — Z5112 Encounter for antineoplastic immunotherapy: Secondary | ICD-10-CM | POA: Diagnosis not present

## 2018-04-14 DIAGNOSIS — C9001 Multiple myeloma in remission: Secondary | ICD-10-CM

## 2018-04-14 DIAGNOSIS — D72819 Decreased white blood cell count, unspecified: Secondary | ICD-10-CM | POA: Diagnosis not present

## 2018-04-14 DIAGNOSIS — Z9484 Stem cells transplant status: Secondary | ICD-10-CM | POA: Diagnosis not present

## 2018-04-14 DIAGNOSIS — D539 Nutritional anemia, unspecified: Secondary | ICD-10-CM | POA: Diagnosis not present

## 2018-04-14 DIAGNOSIS — Z79899 Other long term (current) drug therapy: Secondary | ICD-10-CM | POA: Diagnosis not present

## 2018-04-14 LAB — CBC WITH DIFFERENTIAL (CANCER CENTER ONLY)
Abs Immature Granulocytes: 0.01 10*3/uL (ref 0.00–0.07)
Basophils Absolute: 0 10*3/uL (ref 0.0–0.1)
Basophils Relative: 1 %
Eosinophils Absolute: 0.1 10*3/uL (ref 0.0–0.5)
Eosinophils Relative: 2 %
HCT: 37.4 % — ABNORMAL LOW (ref 39.0–52.0)
Hemoglobin: 13 g/dL (ref 13.0–17.0)
Immature Granulocytes: 0 %
Lymphocytes Relative: 17 %
Lymphs Abs: 0.7 10*3/uL (ref 0.7–4.0)
MCH: 35.1 pg — ABNORMAL HIGH (ref 26.0–34.0)
MCHC: 34.8 g/dL (ref 30.0–36.0)
MCV: 101.1 fL — ABNORMAL HIGH (ref 80.0–100.0)
Monocytes Absolute: 0.5 10*3/uL (ref 0.1–1.0)
Monocytes Relative: 12 %
Neutro Abs: 2.7 10*3/uL (ref 1.7–7.7)
Neutrophils Relative %: 68 %
Platelet Count: 130 10*3/uL — ABNORMAL LOW (ref 150–400)
RBC: 3.7 MIL/uL — ABNORMAL LOW (ref 4.22–5.81)
RDW: 12 % (ref 11.5–15.5)
WBC Count: 3.9 10*3/uL — ABNORMAL LOW (ref 4.0–10.5)
nRBC: 0 % (ref 0.0–0.2)

## 2018-04-14 LAB — CMP (CANCER CENTER ONLY)
ALT: 66 U/L — ABNORMAL HIGH (ref 0–44)
AST: 42 U/L — ABNORMAL HIGH (ref 15–41)
Albumin: 4.2 g/dL (ref 3.5–5.0)
Alkaline Phosphatase: 54 U/L (ref 38–126)
Anion gap: 7 (ref 5–15)
BUN: 17 mg/dL (ref 6–20)
CO2: 22 mmol/L (ref 22–32)
Calcium: 8.3 mg/dL — ABNORMAL LOW (ref 8.9–10.3)
Chloride: 105 mmol/L (ref 98–111)
Creatinine: 1.34 mg/dL — ABNORMAL HIGH (ref 0.61–1.24)
GFR, Est AFR Am: >60 mL/min (ref >60)
GFR, Estimated: >60 mL/min (ref >60)
Glucose, Bld: 101 mg/dL — ABNORMAL HIGH (ref 70–99)
Potassium: 4.1 mmol/L (ref 3.5–5.1)
Sodium: 134 mmol/L — ABNORMAL LOW (ref 135–145)
Total Bilirubin: 0.7 mg/dL (ref 0.3–1.2)
Total Protein: 6.9 g/dL (ref 6.5–8.1)

## 2018-04-14 MED ORDER — BORTEZOMIB CHEMO SQ INJECTION 3.5 MG (2.5MG/ML)
3.0000 mg | Freq: Once | INTRAMUSCULAR | Status: AC
  Administered 2018-04-14: 3 mg via SUBCUTANEOUS
  Filled 2018-04-14: qty 1.2

## 2018-04-14 NOTE — Patient Instructions (Signed)

## 2018-04-18 ENCOUNTER — Other Ambulatory Visit: Payer: Self-pay | Admitting: *Deleted

## 2018-04-18 DIAGNOSIS — C9002 Multiple myeloma in relapse: Secondary | ICD-10-CM

## 2018-04-18 MED ORDER — LENALIDOMIDE 10 MG PO CAPS: ORAL_CAPSULE | ORAL | 0 refills | Status: DC

## 2018-04-21 ENCOUNTER — Other Ambulatory Visit: Payer: Self-pay | Admitting: *Deleted

## 2018-04-21 DIAGNOSIS — C9002 Multiple myeloma in relapse: Secondary | ICD-10-CM

## 2018-04-21 MED ORDER — LENALIDOMIDE 10 MG PO CAPS: ORAL_CAPSULE | ORAL | 0 refills | Status: DC

## 2018-04-27 ENCOUNTER — Other Ambulatory Visit: Payer: Self-pay | Admitting: *Deleted

## 2018-04-27 DIAGNOSIS — C9000 Multiple myeloma not having achieved remission: Secondary | ICD-10-CM

## 2018-04-27 DIAGNOSIS — C9001 Multiple myeloma in remission: Secondary | ICD-10-CM

## 2018-04-27 DIAGNOSIS — D61818 Other pancytopenia: Secondary | ICD-10-CM

## 2018-04-28 ENCOUNTER — Inpatient Hospital Stay: Payer: BLUE CROSS/BLUE SHIELD | Attending: Hematology & Oncology | Admitting: Hematology & Oncology

## 2018-04-28 ENCOUNTER — Other Ambulatory Visit: Payer: Self-pay

## 2018-04-28 ENCOUNTER — Inpatient Hospital Stay: Payer: BLUE CROSS/BLUE SHIELD

## 2018-04-28 ENCOUNTER — Encounter: Payer: Self-pay | Admitting: Hematology & Oncology

## 2018-04-28 VITALS — BP 127/79 | HR 52 | Temp 97.7°F | Resp 18 | Wt 221.5 lb

## 2018-04-28 DIAGNOSIS — C9001 Multiple myeloma in remission: Secondary | ICD-10-CM

## 2018-04-28 DIAGNOSIS — Z9221 Personal history of antineoplastic chemotherapy: Secondary | ICD-10-CM | POA: Diagnosis not present

## 2018-04-28 DIAGNOSIS — Z9484 Stem cells transplant status: Secondary | ICD-10-CM | POA: Diagnosis not present

## 2018-04-28 DIAGNOSIS — Z5111 Encounter for antineoplastic chemotherapy: Secondary | ICD-10-CM | POA: Diagnosis not present

## 2018-04-28 DIAGNOSIS — C9000 Multiple myeloma not having achieved remission: Secondary | ICD-10-CM

## 2018-04-28 DIAGNOSIS — D61818 Other pancytopenia: Secondary | ICD-10-CM

## 2018-04-28 DIAGNOSIS — C9002 Multiple myeloma in relapse: Secondary | ICD-10-CM | POA: Insufficient documentation

## 2018-04-28 LAB — CBC WITH DIFFERENTIAL (CANCER CENTER ONLY)
Abs Immature Granulocytes: 0.01 10*3/uL (ref 0.00–0.07)
Basophils Absolute: 0 10*3/uL (ref 0.0–0.1)
Basophils Relative: 0 %
Eosinophils Absolute: 0 10*3/uL (ref 0.0–0.5)
Eosinophils Relative: 1 %
HCT: 39.1 % (ref 39.0–52.0)
Hemoglobin: 13.9 g/dL (ref 13.0–17.0)
Immature Granulocytes: 0 %
Lymphocytes Relative: 36 %
Lymphs Abs: 1 10*3/uL (ref 0.7–4.0)
MCH: 36.2 pg — ABNORMAL HIGH (ref 26.0–34.0)
MCHC: 35.5 g/dL (ref 30.0–36.0)
MCV: 101.8 fL — ABNORMAL HIGH (ref 80.0–100.0)
Monocytes Absolute: 0.3 10*3/uL (ref 0.1–1.0)
Monocytes Relative: 9 %
Neutro Abs: 1.5 10*3/uL — ABNORMAL LOW (ref 1.7–7.7)
Neutrophils Relative %: 54 %
Platelet Count: 158 10*3/uL (ref 150–400)
RBC: 3.84 MIL/uL — ABNORMAL LOW (ref 4.22–5.81)
RDW: 12.6 % (ref 11.5–15.5)
WBC Count: 2.8 10*3/uL — ABNORMAL LOW (ref 4.0–10.5)
nRBC: 0 % (ref 0.0–0.2)

## 2018-04-28 LAB — CMP (CANCER CENTER ONLY)
ALT: 46 U/L — ABNORMAL HIGH (ref 0–44)
AST: 30 U/L (ref 15–41)
Albumin: 4.3 g/dL (ref 3.5–5.0)
Alkaline Phosphatase: 54 U/L (ref 38–126)
Anion gap: 5 (ref 5–15)
BUN: 18 mg/dL (ref 6–20)
CO2: 27 mmol/L (ref 22–32)
Calcium: 8.8 mg/dL — ABNORMAL LOW (ref 8.9–10.3)
Chloride: 107 mmol/L (ref 98–111)
Creatinine: 1.22 mg/dL (ref 0.61–1.24)
GFR, Est AFR Am: >60 mL/min (ref >60)
GFR, Estimated: >60 mL/min (ref >60)
Glucose, Bld: 90 mg/dL (ref 70–99)
Potassium: 4.5 mmol/L (ref 3.5–5.1)
Sodium: 139 mmol/L (ref 135–145)
Total Bilirubin: 0.6 mg/dL (ref 0.3–1.2)
Total Protein: 6.9 g/dL (ref 6.5–8.1)

## 2018-04-28 MED ORDER — BORTEZOMIB CHEMO SQ INJECTION 3.5 MG (2.5MG/ML)
3.0000 mg | Freq: Once | INTRAMUSCULAR | Status: AC
  Administered 2018-04-28: 3 mg via SUBCUTANEOUS
  Filled 2018-04-28: qty 1.2

## 2018-04-28 NOTE — Patient Instructions (Signed)
[facility] Discharge Instructions for Patients Receiving Chemotherapy  Today you received the following chemotherapy agents Velcade To help prevent nausea and vomiting after your treatment, we encourage you to take your nausea medication as prescribed.   If you develop nausea and vomiting that is not controlled by your nausea medication, call the clinic.   BELOW ARE SYMPTOMS THAT SHOULD BE REPORTED IMMEDIATELY:  *FEVER GREATER THAN 100.5 F  *CHILLS WITH OR WITHOUT FEVER  NAUSEA AND VOMITING THAT IS NOT CONTROLLED WITH YOUR NAUSEA MEDICATION  *UNUSUAL SHORTNESS OF BREATH  *UNUSUAL BRUISING OR BLEEDING  TENDERNESS IN MOUTH AND THROAT WITH OR WITHOUT PRESENCE OF ULCERS  *URINARY PROBLEMS  *BOWEL PROBLEMS  UNUSUAL RASH Items with * indicate a potential emergency and should be followed up as soon as possible.  Feel free to call the clinic should you have any questions or concerns. The clinic phone number is [phone].  Please show the CHEMO ALERT CARD at check-in to the Emergency Department and triage nurse.   

## 2018-04-28 NOTE — Progress Notes (Signed)
Hematology and Oncology Follow Up Visit  Tyler Phillips [id number] 15-Feb-1966 53 y.o. 04/28/2018   Principle Diagnosis:  IgG Kappa myeloma - +4, +14, +17  Current Therapy:   Status post autologous stem cell transplant on 05/22/2015  S/p cycle 6 of RVD  Velcade q 2wk dosing/Revlimid 10mg  po q day (21/7)    Interim History:  [name] is here today for follow-up.  He got through the holiday season without any problems.  He and his family state in town for the holidays.  He has a daughter who is working up in [place] but she is out in [place] right now.  She is doing work out in [place].  As far as his myeloma goes, he is done incredibly well with the myeloma.  There is been no monoclonal spike noted in his blood.  His IgG level back in December was 1300 mg/dL.  His kappa light chain was 3.1 mg/dL.  His appetite is good.  He is exercising.  He might think about changing jobs.  However, he wants to make sure he does not lose his health benefits.  He seems to be doing okay with the Revlimid.  He has been having some issues with his bowels.  He says on occasion, he has to have a quick bowel movement that is diarrhea.  I suppose this might be from the Revlimid.  He has had no problems with his urine.  He has had no bleeding.  There is been no rashes.  He has had no pruritus.  He has had no leg swelling.    Overall, his performance status is ECOG 0.    Medications:  Allergies as of 04/28/2018   No Known Allergies     Medication List       Accurate as of April 28, 2018  9:54 AM. Always use your most recent med list.        aspirin 325 MG tablet Take 325 mg by mouth daily.   bortezomib IV 3.5 MG injection Commonly known as:  VELCADE Inject as directed every 14 (fourteen) days.   cetirizine 10 MG tablet Commonly known as:  ZYRTEC Take 10 mg by mouth daily.   clindamycin 300 MG capsule Commonly known as:  CLEOCIN Take 600 mg by mouth See admin instructions. 1  hour prior to dental appts   lenalidomide 10 MG capsule Commonly known as:  REVLIMID Take 1 capsule by mouth daily for 21 days on and 7 days off. Auth. #[id number] . Ht 73 in. Wt 224 lb.   LORazepam 1 MG tablet Commonly known as:  ATIVAN Take 1 mg by mouth every 4 (four) hours as needed.   oxyCODONE 5 MG immediate release tablet Commonly known as:  Oxy IR/ROXICODONE Take 5 mg by mouth every 4 (four) hours as needed.   penicillin v potassium 500 MG tablet Commonly known as:  VEETID TAKE ONE TABLET (500 MG TOTAL) BY MOUTH EVERY 6 HOURS FOR 7 DAYS.   prochlorperazine 10 MG tablet Commonly known as:  COMPAZINE Take 10 mg by mouth every 6 (six) hours as needed.   valACYclovir 500 MG tablet Commonly known as:  VALTREX Take 500 mg by mouth 2 (two) times daily.   Vitamin D3 25 MCG (1000 UT) Caps Take by mouth.       Allergies: No Known Allergies  Past Medical History, Surgical history, Social history, and Family History were reviewed and updated.  Review of Systems: Review of Systems  Constitutional: Negative.  HENT: Negative.   Eyes: Negative.   Respiratory: Negative.   Cardiovascular: Negative.   Gastrointestinal: Negative.   Genitourinary: Negative.   Musculoskeletal: Negative.   Skin: Negative.   Neurological: Negative.   Endo/Heme/Allergies: Negative.   Psychiatric/Behavioral: Negative.      Physical Exam:  weight is 221 lb 8 oz (100.5 kg). His oral temperature is 97.7 F (36.5 C). His blood pressure is 127/79 and his pulse is 52 (abnormal). His respiration is 18 and oxygen saturation is 100%.   Wt Readings from Last 3 Encounters:  04/28/18 221 lb 8 oz (100.5 kg)  03/28/18 226 lb (102.5 kg)  03/03/18 227 lb (103 kg)    Physical Exam Vitals signs reviewed.  HENT:     Head: Normocephalic and atraumatic.  Eyes:     Pupils: Pupils are equal, round, and reactive to light.  Neck:     Musculoskeletal: Normal range of motion.  Cardiovascular:     Rate and  Rhythm: Normal rate and regular rhythm.     Heart sounds: Normal heart sounds.  Pulmonary:     Effort: Pulmonary effort is normal.     Breath sounds: Normal breath sounds.  Abdominal:     General: Bowel sounds are normal.     Palpations: Abdomen is soft.  Musculoskeletal: Normal range of motion.        General: No tenderness or deformity.  Lymphadenopathy:     Cervical: No cervical adenopathy.  Skin:    General: Skin is warm and dry.     Findings: No erythema or rash.  Neurological:     Mental Status: He is alert and oriented to person, place, and time.  Psychiatric:        Behavior: Behavior normal.        Thought Content: Thought content normal.        Judgment: Judgment normal.      Lab Results  Component Value Date   WBC 2.8 (L) 04/28/2018   HGB 13.9 04/28/2018   HCT 39.1 04/28/2018   MCV 101.8 (H) 04/28/2018   PLT 158 04/28/2018   Lab Results  Component Value Date   FERRITIN 1,263 (H) 07/20/2014   IRON 125 07/20/2014   TIBC 198 (L) 07/20/2014   UIBC 73 (L) 07/20/2014   IRONPCTSAT 63 (H) 07/20/2014   Lab Results  Component Value Date   RETICCTPCT 0.8 07/20/2014   RBC 3.84 (L) 04/28/2018   Lab Results  Component Value Date   KPAFRELGTCHN 31.6 (H) 03/28/2018   LAMBDASER 21.1 03/28/2018   KAPLAMBRATIO 1.50 03/28/2018   Lab Results  Component Value Date   IGGSERUM 1,300 03/28/2018   IGA 140 03/28/2018   IGMSERUM 20 03/28/2018   Lab Results  Component Value Date   TOTALPROTELP 6.7 03/28/2018   ALBUMINELP 3.9 03/03/2018   A1GS 0.1 03/03/2018   A2GS 0.6 03/03/2018   BETS 0.8 03/03/2018   BETA2SER 0.3 03/28/2015   GAMS 1.2 03/03/2018   MSPIKE Not Observed 03/03/2018   SPEI Comment 03/03/2018     Chemistry      Component Value Date/Time   NA 139 04/28/2018 0904   NA 140 04/22/2017 1140   NA 138 09/03/2015 1042   K 4.5 04/28/2018 0904   K 4.0 04/22/2017 1140   K 4.3 09/03/2015 1042   CL 107 04/28/2018 0904   CL 105 04/22/2017 1140   CO2 27  04/28/2018 0904   CO2 24 04/22/2017 1140   CO2 25 09/03/2015 1042   BUN 18  04/28/2018 0904   BUN 16 04/22/2017 1140   BUN 21.7 09/03/2015 1042   CREATININE 1.22 04/28/2018 0904   CREATININE 1.6 (H) 04/22/2017 1140   CREATININE 1.2 09/03/2015 1042      Component Value Date/Time   CALCIUM 8.8 (L) 04/28/2018 0904   CALCIUM 8.5 04/22/2017 1140   CALCIUM 9.1 09/03/2015 1042   ALKPHOS 54 04/28/2018 0904   ALKPHOS 59 04/22/2017 1140   ALKPHOS 42 09/03/2015 1042   AST 30 04/28/2018 0904   AST 27 09/03/2015 1042   ALT 46 (H) 04/28/2018 0904   ALT 45 04/22/2017 1140   ALT 41 09/03/2015 1042   BILITOT 0.6 04/28/2018 0904   BILITOT 0.54 09/03/2015 1042      Impression and Plan: [name] is a pleasant 53 yo caucasian gentleman with IgG kappa myeloma.  He underwent induction chemotherapy with RVD.  He then underwent an autologous stem cell transplant at [facility] on February 2017.  I am incredibly happy that it is now been 3 years since he had his transplant.  He has been in remission.  Hopefully he will continue to stay in remission.  He goes back to [facility] in February.  I will plan to see him back in another month.  He comes back in 2 weeks for his Velcade.      [name], MD 1/10/20209:54 AM

## 2018-05-11 ENCOUNTER — Other Ambulatory Visit: Payer: Self-pay | Admitting: *Deleted

## 2018-05-11 DIAGNOSIS — C9001 Multiple myeloma in remission: Secondary | ICD-10-CM

## 2018-05-12 ENCOUNTER — Inpatient Hospital Stay: Payer: BLUE CROSS/BLUE SHIELD

## 2018-05-12 VITALS — BP 142/83 | HR 65 | Temp 97.6°F | Resp 20

## 2018-05-12 DIAGNOSIS — C9001 Multiple myeloma in remission: Secondary | ICD-10-CM

## 2018-05-12 DIAGNOSIS — Z5111 Encounter for antineoplastic chemotherapy: Secondary | ICD-10-CM | POA: Diagnosis not present

## 2018-05-12 DIAGNOSIS — C9002 Multiple myeloma in relapse: Secondary | ICD-10-CM | POA: Diagnosis not present

## 2018-05-12 LAB — CBC WITH DIFFERENTIAL (CANCER CENTER ONLY)
Abs Immature Granulocytes: 0.01 10*3/uL (ref 0.00–0.07)
Basophils Absolute: 0 10*3/uL (ref 0.0–0.1)
Basophils Relative: 1 %
Eosinophils Absolute: 0.1 10*3/uL (ref 0.0–0.5)
Eosinophils Relative: 3 %
HCT: 38.2 % — ABNORMAL LOW (ref 39.0–52.0)
Hemoglobin: 13.6 g/dL (ref 13.0–17.0)
Immature Granulocytes: 0 %
Lymphocytes Relative: 28 %
Lymphs Abs: 0.9 10*3/uL (ref 0.7–4.0)
MCH: 36.3 pg — ABNORMAL HIGH (ref 26.0–34.0)
MCHC: 35.6 g/dL (ref 30.0–36.0)
MCV: 101.9 fL — ABNORMAL HIGH (ref 80.0–100.0)
Monocytes Absolute: 0.4 10*3/uL (ref 0.1–1.0)
Monocytes Relative: 13 %
Neutro Abs: 1.7 10*3/uL (ref 1.7–7.7)
Neutrophils Relative %: 55 %
Platelet Count: 146 10*3/uL — ABNORMAL LOW (ref 150–400)
RBC: 3.75 MIL/uL — ABNORMAL LOW (ref 4.22–5.81)
RDW: 12.8 % (ref 11.5–15.5)
WBC Count: 3.2 10*3/uL — ABNORMAL LOW (ref 4.0–10.5)
nRBC: 0 % (ref 0.0–0.2)

## 2018-05-12 LAB — CMP (CANCER CENTER ONLY)
ALT: 55 U/L — ABNORMAL HIGH (ref 0–44)
AST: 27 U/L (ref 15–41)
Albumin: 4.5 g/dL (ref 3.5–5.0)
Alkaline Phosphatase: 55 U/L (ref 38–126)
Anion gap: 6 (ref 5–15)
BUN: 13 mg/dL (ref 6–20)
CO2: 27 mmol/L (ref 22–32)
Calcium: 9.3 mg/dL (ref 8.9–10.3)
Chloride: 104 mmol/L (ref 98–111)
Creatinine: 1.3 mg/dL — ABNORMAL HIGH (ref 0.61–1.24)
GFR, Est AFR Am: >60 mL/min (ref >60)
GFR, Estimated: >60 mL/min (ref >60)
Glucose, Bld: 97 mg/dL (ref 70–99)
Potassium: 4 mmol/L (ref 3.5–5.1)
Sodium: 137 mmol/L (ref 135–145)
Total Bilirubin: 0.7 mg/dL (ref 0.3–1.2)
Total Protein: 6.8 g/dL (ref 6.5–8.1)

## 2018-05-12 MED ORDER — BORTEZOMIB CHEMO SQ INJECTION 3.5 MG (2.5MG/ML)
3.0000 mg | Freq: Once | INTRAMUSCULAR | Status: AC
  Administered 2018-05-12: 3 mg via SUBCUTANEOUS
  Filled 2018-05-12: qty 1.2

## 2018-05-12 NOTE — Patient Instructions (Signed)

## 2018-05-16 ENCOUNTER — Other Ambulatory Visit: Payer: Self-pay | Admitting: *Deleted

## 2018-05-16 DIAGNOSIS — C9002 Multiple myeloma in relapse: Secondary | ICD-10-CM

## 2018-05-16 MED ORDER — LENALIDOMIDE 10 MG PO CAPS: ORAL_CAPSULE | ORAL | 0 refills | Status: DC

## 2018-05-26 ENCOUNTER — Inpatient Hospital Stay: Payer: BLUE CROSS/BLUE SHIELD

## 2018-05-26 ENCOUNTER — Other Ambulatory Visit: Payer: Self-pay

## 2018-05-26 ENCOUNTER — Inpatient Hospital Stay: Payer: BLUE CROSS/BLUE SHIELD | Attending: Hematology & Oncology | Admitting: Hematology & Oncology

## 2018-05-26 ENCOUNTER — Encounter: Payer: Self-pay | Admitting: Hematology & Oncology

## 2018-05-26 VITALS — BP 139/83 | HR 58 | Temp 98.2°F | Resp 18 | Wt 224.0 lb

## 2018-05-26 DIAGNOSIS — Z9484 Stem cells transplant status: Secondary | ICD-10-CM | POA: Diagnosis not present

## 2018-05-26 DIAGNOSIS — Z79899 Other long term (current) drug therapy: Secondary | ICD-10-CM | POA: Diagnosis not present

## 2018-05-26 DIAGNOSIS — C9001 Multiple myeloma in remission: Secondary | ICD-10-CM

## 2018-05-26 DIAGNOSIS — Z5112 Encounter for antineoplastic immunotherapy: Secondary | ICD-10-CM | POA: Insufficient documentation

## 2018-05-26 DIAGNOSIS — C9000 Multiple myeloma not having achieved remission: Secondary | ICD-10-CM | POA: Diagnosis not present

## 2018-05-26 LAB — CBC WITH DIFFERENTIAL (CANCER CENTER ONLY)
Abs Immature Granulocytes: 0 10*3/uL (ref 0.00–0.07)
Basophils Absolute: 0 10*3/uL (ref 0.0–0.1)
Basophils Relative: 1 %
Eosinophils Absolute: 0 10*3/uL (ref 0.0–0.5)
Eosinophils Relative: 1 %
HCT: 36.2 % — ABNORMAL LOW (ref 39.0–52.0)
Hemoglobin: 12.7 g/dL — ABNORMAL LOW (ref 13.0–17.0)
Immature Granulocytes: 0 %
Lymphocytes Relative: 33 %
Lymphs Abs: 1.1 10*3/uL (ref 0.7–4.0)
MCH: 35.5 pg — ABNORMAL HIGH (ref 26.0–34.0)
MCHC: 35.1 g/dL (ref 30.0–36.0)
MCV: 101.1 fL — ABNORMAL HIGH (ref 80.0–100.0)
Monocytes Absolute: 0.4 10*3/uL (ref 0.1–1.0)
Monocytes Relative: 11 %
Neutro Abs: 1.8 10*3/uL (ref 1.7–7.7)
Neutrophils Relative %: 54 %
Platelet Count: 160 10*3/uL (ref 150–400)
RBC: 3.58 MIL/uL — ABNORMAL LOW (ref 4.22–5.81)
RDW: 13 % (ref 11.5–15.5)
WBC Count: 3.3 10*3/uL — ABNORMAL LOW (ref 4.0–10.5)
nRBC: 0 % (ref 0.0–0.2)

## 2018-05-26 LAB — CMP (CANCER CENTER ONLY)
ALT: 37 U/L (ref 0–44)
AST: 20 U/L (ref 15–41)
Albumin: 4.3 g/dL (ref 3.5–5.0)
Alkaline Phosphatase: 56 U/L (ref 38–126)
Anion gap: 4 — ABNORMAL LOW (ref 5–15)
BUN: 15 mg/dL (ref 6–20)
CO2: 26 mmol/L (ref 22–32)
Calcium: 8.9 mg/dL (ref 8.9–10.3)
Chloride: 108 mmol/L (ref 98–111)
Creatinine: 1.23 mg/dL (ref 0.61–1.24)
GFR, Est AFR Am: >60 mL/min (ref >60)
GFR, Estimated: >60 mL/min (ref >60)
Glucose, Bld: 92 mg/dL (ref 70–99)
Potassium: 4 mmol/L (ref 3.5–5.1)
Sodium: 138 mmol/L (ref 135–145)
Total Bilirubin: 0.6 mg/dL (ref 0.3–1.2)
Total Protein: 6.5 g/dL (ref 6.5–8.1)

## 2018-05-26 MED ORDER — BORTEZOMIB CHEMO SQ INJECTION 3.5 MG (2.5MG/ML)
3.0000 mg | Freq: Once | INTRAMUSCULAR | Status: AC
  Administered 2018-05-26: 3 mg via SUBCUTANEOUS
  Filled 2018-05-26: qty 1.2

## 2018-05-26 NOTE — Patient Instructions (Signed)

## 2018-05-26 NOTE — Progress Notes (Signed)
Hematology and Oncology Follow Up Visit  Tyler Phillips [id number] April 14, 1966 53 y.o. 05/26/2018   Principle Diagnosis:  IgG Kappa myeloma - +4, +14, +17  Current Therapy:   Status post autologous stem cell transplant on 05/22/2015  S/p cycle 6 of RVD  Velcade q 2wk dosing/Revlimid 10mg  po q day (21/7)    Interim History:  [name] is here today for follow-up.  He comes in with his wife.  As always, he is doing quite well.  He actually sees his transplant doctors at [facility] next Wednesday.  We last checked his myeloma studies back in December 2019, his M spike was not found in the blood.  His IgG level was 1300 mg/dL.  His kappa light chain was 3.2 mg/dL.  He is doing well on the Revlimid.  He has not had any issues with his bowels or bladder.  There is been no diarrhea.  He has had no fever.  He has had no cough.  He has had no mouth sores.  There is been no leg swelling.  He is still exercising.  He is staying quite active.      Overall, his performance status is ECOG 0.    Medications:  Allergies as of 05/26/2018   No Known Allergies     Medication List       Accurate as of May 26, 2018  1:43 PM. Always use your most recent med list.        aspirin 325 MG tablet Take 325 mg by mouth daily.   bortezomib IV 3.5 MG injection Commonly known as:  VELCADE Inject as directed every 14 (fourteen) days.   cetirizine 10 MG tablet Commonly known as:  ZYRTEC Take 10 mg by mouth daily.   clindamycin 300 MG capsule Commonly known as:  CLEOCIN Take 600 mg by mouth See admin instructions. 1 hour prior to dental appts   lenalidomide 10 MG capsule Commonly known as:  REVLIMID Take 1 capsule by mouth daily for 21 days on and 7 days off. Auth. #[id number] . Ht 73 in. Wt 224 lb.   LORazepam 1 MG tablet Commonly known as:  ATIVAN Take 1 mg by mouth every 4 (four) hours as needed.   oxyCODONE 5 MG immediate release tablet Commonly known as:  Oxy IR/ROXICODONE Take 5 mg by  mouth every 4 (four) hours as needed.   penicillin v potassium 500 MG tablet Commonly known as:  VEETID TAKE ONE TABLET (500 MG TOTAL) BY MOUTH EVERY 6 HOURS FOR 7 DAYS.   prochlorperazine 10 MG tablet Commonly known as:  COMPAZINE Take 10 mg by mouth every 6 (six) hours as needed.   valACYclovir 500 MG tablet Commonly known as:  VALTREX Take 500 mg by mouth 2 (two) times daily.   Vitamin D3 25 MCG (1000 UT) Caps Take by mouth.       Allergies: No Known Allergies  Past Medical History, Surgical history, Social history, and Family History were reviewed and updated.  Review of Systems: Review of Systems  Constitutional: Negative.   HENT: Negative.   Eyes: Negative.   Respiratory: Negative.   Cardiovascular: Negative.   Gastrointestinal: Negative.   Genitourinary: Negative.   Musculoskeletal: Negative.   Skin: Negative.   Neurological: Negative.   Endo/Heme/Allergies: Negative.   Psychiatric/Behavioral: Negative.      Physical Exam:  weight is 224 lb (101.6 kg). His oral temperature is 98.2 F (36.8 C). His blood pressure is 139/83 and his pulse is 58 (abnormal). His  respiration is 18 and oxygen saturation is 99%.   Wt Readings from Last 3 Encounters:  05/26/18 224 lb (101.6 kg)  04/28/18 221 lb 8 oz (100.5 kg)  03/28/18 226 lb (102.5 kg)    Physical Exam Vitals signs reviewed.  HENT:     Head: Normocephalic and atraumatic.  Eyes:     Pupils: Pupils are equal, round, and reactive to light.  Neck:     Musculoskeletal: Normal range of motion.  Cardiovascular:     Rate and Rhythm: Normal rate and regular rhythm.     Heart sounds: Normal heart sounds.  Pulmonary:     Effort: Pulmonary effort is normal.     Breath sounds: Normal breath sounds.  Abdominal:     General: Bowel sounds are normal.     Palpations: Abdomen is soft.  Musculoskeletal: Normal range of motion.        General: No tenderness or deformity.  Lymphadenopathy:     Cervical: No cervical  adenopathy.  Skin:    General: Skin is warm and dry.     Findings: No erythema or rash.  Neurological:     Mental Status: He is alert and oriented to person, place, and time.  Psychiatric:        Behavior: Behavior normal.        Thought Content: Thought content normal.        Judgment: Judgment normal.      Lab Results  Component Value Date   WBC 3.3 (L) 05/26/2018   HGB 12.7 (L) 05/26/2018   HCT 36.2 (L) 05/26/2018   MCV 101.1 (H) 05/26/2018   PLT 160 05/26/2018   Lab Results  Component Value Date   FERRITIN 1,263 (H) 07/20/2014   IRON 125 07/20/2014   TIBC 198 (L) 07/20/2014   UIBC 73 (L) 07/20/2014   IRONPCTSAT 63 (H) 07/20/2014   Lab Results  Component Value Date   RETICCTPCT 0.8 07/20/2014   RBC 3.58 (L) 05/26/2018   Lab Results  Component Value Date   KPAFRELGTCHN 31.6 (H) 03/28/2018   LAMBDASER 21.1 03/28/2018   KAPLAMBRATIO 1.50 03/28/2018   Lab Results  Component Value Date   IGGSERUM 1,300 03/28/2018   IGA 140 03/28/2018   IGMSERUM 20 03/28/2018   Lab Results  Component Value Date   TOTALPROTELP 6.7 03/28/2018   ALBUMINELP 3.9 03/03/2018   A1GS 0.1 03/03/2018   A2GS 0.6 03/03/2018   BETS 0.8 03/03/2018   BETA2SER 0.3 03/28/2015   GAMS 1.2 03/03/2018   MSPIKE Not Observed 03/03/2018   SPEI Comment 03/03/2018     Chemistry      Component Value Date/Time   NA 138 05/26/2018 1122   NA 140 04/22/2017 1140   NA 138 09/03/2015 1042   K 4.0 05/26/2018 1122   K 4.0 04/22/2017 1140   K 4.3 09/03/2015 1042   CL 108 05/26/2018 1122   CL 105 04/22/2017 1140   CO2 26 05/26/2018 1122   CO2 24 04/22/2017 1140   CO2 25 09/03/2015 1042   BUN 15 05/26/2018 1122   BUN 16 04/22/2017 1140   BUN 21.7 09/03/2015 1042   CREATININE 1.23 05/26/2018 1122   CREATININE 1.6 (H) 04/22/2017 1140   CREATININE 1.2 09/03/2015 1042      Component Value Date/Time   CALCIUM 8.9 05/26/2018 1122   CALCIUM 8.5 04/22/2017 1140   CALCIUM 9.1 09/03/2015 1042    ALKPHOS 56 05/26/2018 1122   ALKPHOS 59 04/22/2017 1140   ALKPHOS 42 09/03/2015  1042   AST 20 05/26/2018 1122   AST 27 09/03/2015 1042   ALT 37 05/26/2018 1122   ALT 45 04/22/2017 1140   ALT 41 09/03/2015 1042   BILITOT 0.6 05/26/2018 1122   BILITOT 0.54 09/03/2015 1042      Impression and Plan: [name] is a pleasant 53 yo caucasian gentleman with IgG kappa myeloma.  He underwent induction chemotherapy with RVD.  He then underwent an autologous stem cell transplant at [facility] on February 2017.  I am incredibly happy that it is now been 3 years since he had his transplant.  He has been in remission.  Hopefully he will continue to stay in remission.  I will plan to see him back in another month.  He comes back in 2 weeks for his Velcade.      [name], MD 2/7/20201:43 PM

## 2018-05-27 LAB — IGG, IGA, IGM
IgA: 143 mg/dL (ref 90–386)
IgG (Immunoglobin G), Serum: 1183 mg/dL (ref 700–1600)
IgM (Immunoglobulin M), Srm: 17 mg/dL — ABNORMAL LOW (ref 20–172)

## 2018-05-29 LAB — KAPPA/LAMBDA LIGHT CHAINS
Kappa free light chain: 26.2 mg/L — ABNORMAL HIGH (ref 3.3–19.4)
Kappa, lambda light chain ratio: 1.49 (ref 0.26–1.65)
Lambda free light chains: 17.6 mg/L (ref 5.7–26.3)

## 2018-05-29 LAB — PROTEIN ELECTROPHORESIS, SERUM, WITH REFLEX
A/G Ratio: 1.5 (ref 0.7–1.7)
Albumin ELP: 3.8 g/dL (ref 2.9–4.4)
Alpha-1-Globulin: 0.2 g/dL (ref 0.0–0.4)
Alpha-2-Globulin: 0.7 g/dL (ref 0.4–1.0)
Beta Globulin: 0.7 g/dL (ref 0.7–1.3)
Gamma Globulin: 1 g/dL (ref 0.4–1.8)
Globulin, Total: 2.5 g/dL (ref 2.2–3.9)
Total Protein ELP: 6.3 g/dL (ref 6.0–8.5)

## 2018-05-31 DIAGNOSIS — C9000 Multiple myeloma not having achieved remission: Secondary | ICD-10-CM | POA: Diagnosis not present

## 2018-05-31 DIAGNOSIS — Z9481 Bone marrow transplant status: Secondary | ICD-10-CM | POA: Diagnosis not present

## 2018-05-31 DIAGNOSIS — C9001 Multiple myeloma in remission: Secondary | ICD-10-CM | POA: Diagnosis not present

## 2018-06-08 ENCOUNTER — Other Ambulatory Visit: Payer: Self-pay | Admitting: *Deleted

## 2018-06-08 DIAGNOSIS — C9000 Multiple myeloma not having achieved remission: Secondary | ICD-10-CM

## 2018-06-08 DIAGNOSIS — C9001 Multiple myeloma in remission: Secondary | ICD-10-CM

## 2018-06-09 ENCOUNTER — Inpatient Hospital Stay: Payer: BLUE CROSS/BLUE SHIELD

## 2018-06-09 VITALS — BP 142/77 | HR 54 | Temp 97.7°F | Resp 16

## 2018-06-09 DIAGNOSIS — Z5112 Encounter for antineoplastic immunotherapy: Secondary | ICD-10-CM | POA: Diagnosis not present

## 2018-06-09 DIAGNOSIS — C9001 Multiple myeloma in remission: Secondary | ICD-10-CM | POA: Diagnosis not present

## 2018-06-09 DIAGNOSIS — Z9484 Stem cells transplant status: Secondary | ICD-10-CM | POA: Diagnosis not present

## 2018-06-09 DIAGNOSIS — Z79899 Other long term (current) drug therapy: Secondary | ICD-10-CM | POA: Diagnosis not present

## 2018-06-09 DIAGNOSIS — C9000 Multiple myeloma not having achieved remission: Secondary | ICD-10-CM

## 2018-06-09 LAB — CMP (CANCER CENTER ONLY)
ALT: 51 U/L — ABNORMAL HIGH (ref 0–44)
AST: 23 U/L (ref 15–41)
Albumin: 4.2 g/dL (ref 3.5–5.0)
Alkaline Phosphatase: 57 U/L (ref 38–126)
Anion gap: 7 (ref 5–15)
BUN: 12 mg/dL (ref 6–20)
CO2: 24 mmol/L (ref 22–32)
Calcium: 8.7 mg/dL — ABNORMAL LOW (ref 8.9–10.3)
Chloride: 107 mmol/L (ref 98–111)
Creatinine: 1.24 mg/dL (ref 0.61–1.24)
GFR, Est AFR Am: >60 mL/min (ref >60)
GFR, Estimated: >60 mL/min (ref >60)
Glucose, Bld: 103 mg/dL — ABNORMAL HIGH (ref 70–99)
Potassium: 4.1 mmol/L (ref 3.5–5.1)
Sodium: 138 mmol/L (ref 135–145)
Total Bilirubin: 0.5 mg/dL (ref 0.3–1.2)
Total Protein: 6.5 g/dL (ref 6.5–8.1)

## 2018-06-09 LAB — CBC WITH DIFFERENTIAL (CANCER CENTER ONLY)
Abs Immature Granulocytes: 0.01 10*3/uL (ref 0.00–0.07)
Basophils Absolute: 0 10*3/uL (ref 0.0–0.1)
Basophils Relative: 1 %
Eosinophils Absolute: 0.1 10*3/uL (ref 0.0–0.5)
Eosinophils Relative: 3 %
HCT: 35.8 % — ABNORMAL LOW (ref 39.0–52.0)
Hemoglobin: 12.8 g/dL — ABNORMAL LOW (ref 13.0–17.0)
Immature Granulocytes: 0 %
Lymphocytes Relative: 29 %
Lymphs Abs: 0.8 10*3/uL (ref 0.7–4.0)
MCH: 35.7 pg — ABNORMAL HIGH (ref 26.0–34.0)
MCHC: 35.8 g/dL (ref 30.0–36.0)
MCV: 99.7 fL (ref 80.0–100.0)
Monocytes Absolute: 0.4 10*3/uL (ref 0.1–1.0)
Monocytes Relative: 13 %
Neutro Abs: 1.5 10*3/uL — ABNORMAL LOW (ref 1.7–7.7)
Neutrophils Relative %: 54 %
Platelet Count: 139 10*3/uL — ABNORMAL LOW (ref 150–400)
RBC: 3.59 MIL/uL — ABNORMAL LOW (ref 4.22–5.81)
RDW: 12.8 % (ref 11.5–15.5)
WBC Count: 2.8 10*3/uL — ABNORMAL LOW (ref 4.0–10.5)
nRBC: 0 % (ref 0.0–0.2)

## 2018-06-09 MED ORDER — BORTEZOMIB CHEMO SQ INJECTION 3.5 MG (2.5MG/ML)
3.0000 mg | Freq: Once | INTRAMUSCULAR | Status: AC
  Administered 2018-06-09: 3 mg via SUBCUTANEOUS
  Filled 2018-06-09: qty 1.2

## 2018-06-09 NOTE — Patient Instructions (Signed)

## 2018-06-13 ENCOUNTER — Other Ambulatory Visit: Payer: Self-pay | Admitting: *Deleted

## 2018-06-13 DIAGNOSIS — C9002 Multiple myeloma in relapse: Secondary | ICD-10-CM

## 2018-06-13 MED ORDER — LENALIDOMIDE 10 MG PO CAPS: ORAL_CAPSULE | ORAL | 0 refills | Status: DC

## 2018-06-16 ENCOUNTER — Other Ambulatory Visit: Payer: Self-pay | Admitting: *Deleted

## 2018-06-16 DIAGNOSIS — C9002 Multiple myeloma in relapse: Secondary | ICD-10-CM

## 2018-06-16 MED ORDER — LENALIDOMIDE 10 MG PO CAPS: ORAL_CAPSULE | ORAL | 0 refills | Status: DC

## 2018-06-23 ENCOUNTER — Inpatient Hospital Stay (HOSPITAL_BASED_OUTPATIENT_CLINIC_OR_DEPARTMENT_OTHER): Payer: BLUE CROSS/BLUE SHIELD | Admitting: Family

## 2018-06-23 ENCOUNTER — Inpatient Hospital Stay: Payer: BLUE CROSS/BLUE SHIELD | Attending: Hematology & Oncology

## 2018-06-23 ENCOUNTER — Inpatient Hospital Stay: Payer: BLUE CROSS/BLUE SHIELD

## 2018-06-23 VITALS — BP 143/84 | HR 64 | Temp 97.7°F | Resp 19 | Ht 73.0 in | Wt 224.8 lb

## 2018-06-23 VITALS — BP 132/95 | HR 99 | Temp 97.9°F | Resp 18

## 2018-06-23 DIAGNOSIS — Z79899 Other long term (current) drug therapy: Secondary | ICD-10-CM | POA: Diagnosis not present

## 2018-06-23 DIAGNOSIS — G629 Polyneuropathy, unspecified: Secondary | ICD-10-CM

## 2018-06-23 DIAGNOSIS — C9001 Multiple myeloma in remission: Secondary | ICD-10-CM

## 2018-06-23 DIAGNOSIS — C9000 Multiple myeloma not having achieved remission: Secondary | ICD-10-CM

## 2018-06-23 DIAGNOSIS — Z9484 Stem cells transplant status: Secondary | ICD-10-CM | POA: Insufficient documentation

## 2018-06-23 DIAGNOSIS — Z5112 Encounter for antineoplastic immunotherapy: Secondary | ICD-10-CM | POA: Insufficient documentation

## 2018-06-23 DIAGNOSIS — Z7982 Long term (current) use of aspirin: Secondary | ICD-10-CM

## 2018-06-23 LAB — CBC WITH DIFFERENTIAL (CANCER CENTER ONLY)
Abs Immature Granulocytes: 0.01 10*3/uL (ref 0.00–0.07)
Basophils Absolute: 0 10*3/uL (ref 0.0–0.1)
Basophils Relative: 1 %
Eosinophils Absolute: 0 10*3/uL (ref 0.0–0.5)
Eosinophils Relative: 0 %
HCT: 37.6 % — ABNORMAL LOW (ref 39.0–52.0)
Hemoglobin: 13.4 g/dL (ref 13.0–17.0)
Immature Granulocytes: 0 %
Lymphocytes Relative: 30 %
Lymphs Abs: 1 10*3/uL (ref 0.7–4.0)
MCH: 35.8 pg — ABNORMAL HIGH (ref 26.0–34.0)
MCHC: 35.6 g/dL (ref 30.0–36.0)
MCV: 100.5 fL — ABNORMAL HIGH (ref 80.0–100.0)
Monocytes Absolute: 0.4 10*3/uL (ref 0.1–1.0)
Monocytes Relative: 11 %
Neutro Abs: 2 10*3/uL (ref 1.7–7.7)
Neutrophils Relative %: 58 %
Platelet Count: 166 10*3/uL (ref 150–400)
RBC: 3.74 MIL/uL — ABNORMAL LOW (ref 4.22–5.81)
RDW: 13.4 % (ref 11.5–15.5)
WBC Count: 3.4 10*3/uL — ABNORMAL LOW (ref 4.0–10.5)
nRBC: 0 % (ref 0.0–0.2)

## 2018-06-23 LAB — CMP (CANCER CENTER ONLY)
ALT: 52 U/L — ABNORMAL HIGH (ref 0–44)
AST: 26 U/L (ref 15–41)
Albumin: 4.5 g/dL (ref 3.5–5.0)
Alkaline Phosphatase: 54 U/L (ref 38–126)
Anion gap: 5 (ref 5–15)
BUN: 15 mg/dL (ref 6–20)
CO2: 27 mmol/L (ref 22–32)
Calcium: 9.4 mg/dL (ref 8.9–10.3)
Chloride: 105 mmol/L (ref 98–111)
Creatinine: 1.28 mg/dL — ABNORMAL HIGH (ref 0.61–1.24)
GFR, Est AFR Am: >60 mL/min (ref >60)
GFR, Estimated: >60 mL/min (ref >60)
Glucose, Bld: 92 mg/dL (ref 70–99)
Potassium: 4.5 mmol/L (ref 3.5–5.1)
Sodium: 137 mmol/L (ref 135–145)
Total Bilirubin: 0.5 mg/dL (ref 0.3–1.2)
Total Protein: 7.3 g/dL (ref 6.5–8.1)

## 2018-06-23 MED ORDER — BORTEZOMIB CHEMO SQ INJECTION 3.5 MG (2.5MG/ML)
3.0000 mg | Freq: Once | INTRAMUSCULAR | Status: AC
  Administered 2018-06-23: 3 mg via SUBCUTANEOUS
  Filled 2018-06-23: qty 1.2

## 2018-06-23 NOTE — Progress Notes (Signed)
Hematology and Oncology Follow Up Visit  Tyler Phillips [id number] 06-28-1965 53 y.o. 06/23/2018   Principle Diagnosis:  IgG Kappa myeloma - +4, +14, +17  Past Therapy: Status post autologous stem cell transplant on 05/22/2015 S/pcycle 6 of RVD  Current Therapy:   Velcade q 2wk dosing/Revlimid 10mg  po q day (21/7)    Interim History:  Tyler Phillips is here today for follow-up and treatment. He continues to do well and is staying active in [employer] 2-3 times a week.  He was seen by Dr. Laverta Baltimore at [facility] in February and will go back in a year. His treatment regimen will stay the same.  He has had no issue with infections. No fever, chills, n/v, cough, rash, dizziness, SOB, chest pain, palpitations, abdominal pain or changes in bowel or bladder habits.  He has had some indigestion with the full dose aspirin. I suggested he try enteric coated aspirin with food instead and see if this helps. He prefers not to take an antacid.  His kappa light chains last month were 2.64 mg/dL and no M-spike was detected.  No swelling or tenderness in his extremities at this time.  The neuropathy in his feet is stable and he states that it has not effected his gait or activity.  No lymphadenopathy noted on exam.  No episodes of bleeding, no bruising or petechiae.  He has maintained a good appetite and is staying well hydrated. His weight is stable.   ECOG Performance Status: 1 - Symptomatic but completely ambulatory  Medications:  Allergies as of 06/23/2018   No Known Allergies     Medication List       Accurate as of June 23, 2018 10:17 AM. Always use your most recent med list.        aspirin 325 MG tablet Take 325 mg by mouth daily.   bortezomib IV 3.5 MG injection Commonly known as:  VELCADE Inject as directed every 14 (fourteen) days.   cetirizine 10 MG tablet Commonly known as:  ZYRTEC Take 10 mg by mouth daily.   clindamycin 300 MG capsule Commonly known as:  CLEOCIN Take 600 mg by mouth  See admin instructions. 1 hour prior to dental appts   lenalidomide 10 MG capsule Commonly known as:  REVLIMID Take 1 capsule by mouth daily for 21 days on and 7 days off. Auth. #[id number] . Ht 73 in. Wt 224 lb.   LORazepam 1 MG tablet Commonly known as:  ATIVAN Take 1 mg by mouth every 4 (four) hours as needed.   oxyCODONE 5 MG immediate release tablet Commonly known as:  Oxy IR/ROXICODONE Take 5 mg by mouth every 4 (four) hours as needed.   penicillin v potassium 500 MG tablet Commonly known as:  VEETID TAKE ONE TABLET (500 MG TOTAL) BY MOUTH EVERY 6 HOURS FOR 7 DAYS.   prochlorperazine 10 MG tablet Commonly known as:  COMPAZINE Take 10 mg by mouth every 6 (six) hours as needed.   valACYclovir 500 MG tablet Commonly known as:  VALTREX Take 500 mg by mouth 2 (two) times daily.   Vitamin D3 25 MCG (1000 UT) Caps Take by mouth.       Allergies: No Known Allergies  Past Medical History, Surgical history, Social history, and Family History were reviewed and updated.  Review of Systems: All other 10 point review of systems is negative.   Physical Exam:  height is 6\' 1"  (1.854 m) and weight is 224 lb 12 oz (101.9 kg). His oral  temperature is 97.7 F (36.5 C). His blood pressure is 143/84 (abnormal) and his pulse is 64. His respiration is 19 and oxygen saturation is 100%.   Wt Readings from Last 3 Encounters:  06/23/18 224 lb 12 oz (101.9 kg)  05/26/18 224 lb (101.6 kg)  04/28/18 221 lb 8 oz (100.5 kg)    Ocular: Sclerae unicteric, pupils equal, round and reactive to light Ear-nose-throat: Oropharynx clear, dentition fair Lymphatic: No cervical, supraclavicular or axillary adenopathy Lungs no rales or rhonchi, good excursion bilaterally Heart regular rate and rhythm, no murmur appreciated Abd soft, nontender, positive bowel sounds, no liver or spleen tip palpated on exam, no fluid wave  MSK no focal spinal tenderness, no joint edema Neuro: non-focal, well-oriented,  appropriate affect Breasts: Deferred   Lab Results  Component Value Date   WBC 3.4 (L) 06/23/2018   HGB 13.4 06/23/2018   HCT 37.6 (L) 06/23/2018   MCV 100.5 (H) 06/23/2018   PLT 166 06/23/2018   Lab Results  Component Value Date   FERRITIN 1,263 (H) 07/20/2014   IRON 125 07/20/2014   TIBC 198 (L) 07/20/2014   UIBC 73 (L) 07/20/2014   IRONPCTSAT 63 (H) 07/20/2014   Lab Results  Component Value Date   RETICCTPCT 0.8 07/20/2014   RBC 3.74 (L) 06/23/2018   Lab Results  Component Value Date   KPAFRELGTCHN 26.2 (H) 05/26/2018   LAMBDASER 17.6 05/26/2018   KAPLAMBRATIO 1.49 05/26/2018   Lab Results  Component Value Date   IGGSERUM 1,183 05/26/2018   IGA 143 05/26/2018   IGMSERUM 17 (L) 05/26/2018   Lab Results  Component Value Date   TOTALPROTELP 6.3 05/26/2018   ALBUMINELP 3.8 05/26/2018   A1GS 0.2 05/26/2018   A2GS 0.7 05/26/2018   BETS 0.7 05/26/2018   BETA2SER 0.3 03/28/2015   GAMS 1.0 05/26/2018   MSPIKE Not Observed 05/26/2018   SPEI Comment 03/03/2018     Chemistry      Component Value Date/Time   NA 137 06/23/2018 0932   NA 140 04/22/2017 1140   NA 138 09/03/2015 1042   K 4.5 06/23/2018 0932   K 4.0 04/22/2017 1140   K 4.3 09/03/2015 1042   CL 105 06/23/2018 0932   CL 105 04/22/2017 1140   CO2 27 06/23/2018 0932   CO2 24 04/22/2017 1140   CO2 25 09/03/2015 1042   BUN 15 06/23/2018 0932   BUN 16 04/22/2017 1140   BUN 21.7 09/03/2015 1042   CREATININE 1.28 (H) 06/23/2018 0932   CREATININE 1.6 (H) 04/22/2017 1140   CREATININE 1.2 09/03/2015 1042      Component Value Date/Time   CALCIUM 9.4 06/23/2018 0932   CALCIUM 8.5 04/22/2017 1140   CALCIUM 9.1 09/03/2015 1042   ALKPHOS 54 06/23/2018 0932   ALKPHOS 59 04/22/2017 1140   ALKPHOS 42 09/03/2015 1042   AST 26 06/23/2018 0932   AST 27 09/03/2015 1042   ALT 52 (H) 06/23/2018 0932   ALT 45 04/22/2017 1140   ALT 41 09/03/2015 1042   BILITOT 0.5 06/23/2018 0932   BILITOT 0.54 09/03/2015 1042         Impression and Plan: Tyler Phillips is a pleasant 53 yo caucasian gentleman with IgG kappa myeloma. He completed induction chemotherapy with RVD followed by an autologous stem cell transplant at [facility] on February 2017. He continues to do well in remission.  He was seen by Dr. Elroy Channel at [facility] last month and will continue on his same regimen with maintenance Velcade  and Revlimid. We will proceed with treatment today as planned.  We will see him back in another 2 weeks for treatment and MD follow-up in 1 month.  He will contact our office with any questions or concerns. We can certainly see him sooner if needed.   Laverna Peace, NP 3/6/202010:17 AM

## 2018-06-23 NOTE — Patient Instructions (Signed)

## 2018-06-24 LAB — IGG, IGA, IGM
IgA: 244 mg/dL (ref 90–386)
IgG (Immunoglobin G), Serum: 1285 mg/dL (ref 700–1600)
IgM (Immunoglobulin M), Srm: 20 mg/dL (ref 20–172)

## 2018-06-24 LAB — BETA 2 MICROGLOBULIN, SERUM: Beta-2 Microglobulin: 1.7 mg/L (ref 0.6–2.4)

## 2018-06-26 ENCOUNTER — Telehealth: Payer: Self-pay | Admitting: Hematology & Oncology

## 2018-06-26 LAB — PROTEIN ELECTROPHORESIS, SERUM, WITH REFLEX
A/G Ratio: 1.4 (ref 0.7–1.7)
Albumin ELP: 4.1 g/dL (ref 2.9–4.4)
Alpha-1-Globulin: 0.2 g/dL (ref 0.0–0.4)
Alpha-2-Globulin: 0.6 g/dL (ref 0.4–1.0)
Beta Globulin: 0.9 g/dL (ref 0.7–1.3)
Gamma Globulin: 1.2 g/dL (ref 0.4–1.8)
Globulin, Total: 2.9 g/dL (ref 2.2–3.9)
Total Protein ELP: 7 g/dL (ref 6.0–8.5)

## 2018-06-26 LAB — KAPPA/LAMBDA LIGHT CHAINS
Kappa free light chain: 26.3 mg/L — ABNORMAL HIGH (ref 3.3–19.4)
Kappa, lambda light chain ratio: 1.38 (ref 0.26–1.65)
Lambda free light chains: 19 mg/L (ref 5.7–26.3)

## 2018-06-26 LAB — LACTATE DEHYDROGENASE: LDH: 172 U/L (ref 98–192)

## 2018-06-26 NOTE — Telephone Encounter (Signed)
Called and advised patient of appointments that have been added to his schedule.  He did not want a calendar/letter mailed due to My Chart. Per 3/6 los

## 2018-07-07 ENCOUNTER — Other Ambulatory Visit: Payer: Self-pay

## 2018-07-07 ENCOUNTER — Inpatient Hospital Stay: Payer: BLUE CROSS/BLUE SHIELD

## 2018-07-07 VITALS — BP 140/87 | HR 78 | Temp 98.7°F | Resp 18

## 2018-07-07 DIAGNOSIS — C9001 Multiple myeloma in remission: Secondary | ICD-10-CM

## 2018-07-07 DIAGNOSIS — Z79899 Other long term (current) drug therapy: Secondary | ICD-10-CM | POA: Diagnosis not present

## 2018-07-07 DIAGNOSIS — Z7982 Long term (current) use of aspirin: Secondary | ICD-10-CM | POA: Diagnosis not present

## 2018-07-07 DIAGNOSIS — Z5112 Encounter for antineoplastic immunotherapy: Secondary | ICD-10-CM | POA: Diagnosis not present

## 2018-07-07 DIAGNOSIS — Z9484 Stem cells transplant status: Secondary | ICD-10-CM | POA: Diagnosis not present

## 2018-07-07 DIAGNOSIS — C9000 Multiple myeloma not having achieved remission: Secondary | ICD-10-CM | POA: Diagnosis not present

## 2018-07-07 DIAGNOSIS — G629 Polyneuropathy, unspecified: Secondary | ICD-10-CM | POA: Diagnosis not present

## 2018-07-07 LAB — CBC WITH DIFFERENTIAL (CANCER CENTER ONLY)
Abs Immature Granulocytes: 0 10*3/uL (ref 0.00–0.07)
Basophils Absolute: 0 10*3/uL (ref 0.0–0.1)
Basophils Relative: 0 %
Eosinophils Absolute: 0.1 10*3/uL (ref 0.0–0.5)
Eosinophils Relative: 3 %
HCT: 37.4 % — ABNORMAL LOW (ref 39.0–52.0)
Hemoglobin: 13.5 g/dL (ref 13.0–17.0)
Immature Granulocytes: 0 %
Lymphocytes Relative: 28 %
Lymphs Abs: 0.9 10*3/uL (ref 0.7–4.0)
MCH: 36.2 pg — ABNORMAL HIGH (ref 26.0–34.0)
MCHC: 36.1 g/dL — ABNORMAL HIGH (ref 30.0–36.0)
MCV: 100.3 fL — ABNORMAL HIGH (ref 80.0–100.0)
Monocytes Absolute: 0.3 10*3/uL (ref 0.1–1.0)
Monocytes Relative: 11 %
Neutro Abs: 1.7 10*3/uL (ref 1.7–7.7)
Neutrophils Relative %: 58 %
Platelet Count: 139 10*3/uL — ABNORMAL LOW (ref 150–400)
RBC: 3.73 MIL/uL — ABNORMAL LOW (ref 4.22–5.81)
RDW: 13.2 % (ref 11.5–15.5)
WBC Count: 3 10*3/uL — ABNORMAL LOW (ref 4.0–10.5)
nRBC: 0 % (ref 0.0–0.2)

## 2018-07-07 LAB — CMP (CANCER CENTER ONLY)
ALT: 51 U/L — ABNORMAL HIGH (ref 0–44)
AST: 22 U/L (ref 15–41)
Albumin: 4.6 g/dL (ref 3.5–5.0)
Alkaline Phosphatase: 55 U/L (ref 38–126)
Anion gap: 7 (ref 5–15)
BUN: 15 mg/dL (ref 6–20)
CO2: 25 mmol/L (ref 22–32)
Calcium: 8.6 mg/dL — ABNORMAL LOW (ref 8.9–10.3)
Chloride: 105 mmol/L (ref 98–111)
Creatinine: 1.27 mg/dL — ABNORMAL HIGH (ref 0.61–1.24)
GFR, Est AFR Am: >60 mL/min (ref >60)
GFR, Estimated: >60 mL/min (ref >60)
Glucose, Bld: 131 mg/dL — ABNORMAL HIGH (ref 70–99)
Potassium: 3.9 mmol/L (ref 3.5–5.1)
Sodium: 137 mmol/L (ref 135–145)
Total Bilirubin: 0.7 mg/dL (ref 0.3–1.2)
Total Protein: 7.1 g/dL (ref 6.5–8.1)

## 2018-07-07 MED ORDER — BORTEZOMIB CHEMO SQ INJECTION 3.5 MG (2.5MG/ML)
3.0000 mg | Freq: Once | INTRAMUSCULAR | Status: AC
  Administered 2018-07-07: 3 mg via SUBCUTANEOUS
  Filled 2018-07-07: qty 1.2

## 2018-07-07 NOTE — Patient Instructions (Signed)
[facility] Discharge Instructions for Patients Receiving Chemotherapy  Today you received the following chemotherapy agents Velcade To help prevent nausea and vomiting after your treatment, we encourage you to take your nausea medication as prescribed.   If you develop nausea and vomiting that is not controlled by your nausea medication, call the clinic.   BELOW ARE SYMPTOMS THAT SHOULD BE REPORTED IMMEDIATELY:  *FEVER GREATER THAN 100.5 F  *CHILLS WITH OR WITHOUT FEVER  NAUSEA AND VOMITING THAT IS NOT CONTROLLED WITH YOUR NAUSEA MEDICATION  *UNUSUAL SHORTNESS OF BREATH  *UNUSUAL BRUISING OR BLEEDING  TENDERNESS IN MOUTH AND THROAT WITH OR WITHOUT PRESENCE OF ULCERS  *URINARY PROBLEMS  *BOWEL PROBLEMS  UNUSUAL RASH Items with * indicate a potential emergency and should be followed up as soon as possible.  Feel free to call the clinic should you have any questions or concerns. The clinic phone number is [phone].  Please show the CHEMO ALERT CARD at check-in to the Emergency Department and triage nurse.   

## 2018-07-12 ENCOUNTER — Other Ambulatory Visit: Payer: Self-pay | Admitting: *Deleted

## 2018-07-12 DIAGNOSIS — C9002 Multiple myeloma in relapse: Secondary | ICD-10-CM

## 2018-07-12 MED ORDER — LENALIDOMIDE 10 MG PO CAPS: ORAL_CAPSULE | ORAL | 0 refills | Status: DC

## 2018-07-19 ENCOUNTER — Other Ambulatory Visit: Payer: Self-pay | Admitting: *Deleted

## 2018-07-19 DIAGNOSIS — C9002 Multiple myeloma in relapse: Secondary | ICD-10-CM

## 2018-07-19 MED ORDER — LENALIDOMIDE 10 MG PO CAPS: ORAL_CAPSULE | ORAL | 0 refills | Status: DC

## 2018-07-21 ENCOUNTER — Inpatient Hospital Stay: Payer: BLUE CROSS/BLUE SHIELD | Attending: Hematology & Oncology

## 2018-07-21 ENCOUNTER — Other Ambulatory Visit: Payer: Self-pay

## 2018-07-21 ENCOUNTER — Inpatient Hospital Stay (HOSPITAL_BASED_OUTPATIENT_CLINIC_OR_DEPARTMENT_OTHER): Payer: BLUE CROSS/BLUE SHIELD | Admitting: Family

## 2018-07-21 ENCOUNTER — Telehealth: Payer: Self-pay | Admitting: Family

## 2018-07-21 ENCOUNTER — Inpatient Hospital Stay: Payer: BLUE CROSS/BLUE SHIELD

## 2018-07-21 VITALS — BP 126/91 | HR 65 | Temp 98.4°F | Resp 17 | Ht 73.0 in | Wt 225.8 lb

## 2018-07-21 DIAGNOSIS — Z5112 Encounter for antineoplastic immunotherapy: Secondary | ICD-10-CM | POA: Diagnosis not present

## 2018-07-21 DIAGNOSIS — Z9221 Personal history of antineoplastic chemotherapy: Secondary | ICD-10-CM

## 2018-07-21 DIAGNOSIS — C9001 Multiple myeloma in remission: Secondary | ICD-10-CM

## 2018-07-21 DIAGNOSIS — Z9484 Stem cells transplant status: Secondary | ICD-10-CM

## 2018-07-21 DIAGNOSIS — Z79899 Other long term (current) drug therapy: Secondary | ICD-10-CM | POA: Diagnosis not present

## 2018-07-21 LAB — CBC WITH DIFFERENTIAL (CANCER CENTER ONLY)
Abs Immature Granulocytes: 0.01 10*3/uL (ref 0.00–0.07)
Basophils Absolute: 0 10*3/uL (ref 0.0–0.1)
Basophils Relative: 1 %
Eosinophils Absolute: 0 10*3/uL (ref 0.0–0.5)
Eosinophils Relative: 1 %
HCT: 38.8 % — ABNORMAL LOW (ref 39.0–52.0)
Hemoglobin: 13.7 g/dL (ref 13.0–17.0)
Immature Granulocytes: 0 %
Lymphocytes Relative: 31 %
Lymphs Abs: 0.9 10*3/uL (ref 0.7–4.0)
MCH: 35.8 pg — ABNORMAL HIGH (ref 26.0–34.0)
MCHC: 35.3 g/dL (ref 30.0–36.0)
MCV: 101.3 fL — ABNORMAL HIGH (ref 80.0–100.0)
Monocytes Absolute: 0.4 10*3/uL (ref 0.1–1.0)
Monocytes Relative: 12 %
Neutro Abs: 1.6 10*3/uL — ABNORMAL LOW (ref 1.7–7.7)
Neutrophils Relative %: 55 %
Platelet Count: 145 10*3/uL — ABNORMAL LOW (ref 150–400)
RBC: 3.83 MIL/uL — ABNORMAL LOW (ref 4.22–5.81)
RDW: 13.2 % (ref 11.5–15.5)
WBC Count: 2.9 10*3/uL — ABNORMAL LOW (ref 4.0–10.5)
nRBC: 0 % (ref 0.0–0.2)

## 2018-07-21 LAB — CMP (CANCER CENTER ONLY)
ALT: 59 U/L — ABNORMAL HIGH (ref 0–44)
AST: 29 U/L (ref 15–41)
Albumin: 4.6 g/dL (ref 3.5–5.0)
Alkaline Phosphatase: 57 U/L (ref 38–126)
Anion gap: 7 (ref 5–15)
BUN: 18 mg/dL (ref 6–20)
CO2: 26 mmol/L (ref 22–32)
Calcium: 8.8 mg/dL — ABNORMAL LOW (ref 8.9–10.3)
Chloride: 106 mmol/L (ref 98–111)
Creatinine: 1.34 mg/dL — ABNORMAL HIGH (ref 0.61–1.24)
GFR, Est AFR Am: >60 mL/min (ref >60)
GFR, Estimated: >60 mL/min (ref >60)
Glucose, Bld: 84 mg/dL (ref 70–99)
Potassium: 4.6 mmol/L (ref 3.5–5.1)
Sodium: 139 mmol/L (ref 135–145)
Total Bilirubin: 0.6 mg/dL (ref 0.3–1.2)
Total Protein: 7.3 g/dL (ref 6.5–8.1)

## 2018-07-21 LAB — LACTATE DEHYDROGENASE: LDH: 205 U/L — ABNORMAL HIGH (ref 98–192)

## 2018-07-21 MED ORDER — BORTEZOMIB CHEMO SQ INJECTION 3.5 MG (2.5MG/ML)
3.0000 mg | Freq: Once | INTRAMUSCULAR | Status: AC
  Administered 2018-07-21: 3 mg via SUBCUTANEOUS
  Filled 2018-07-21: qty 1.2

## 2018-07-21 NOTE — Progress Notes (Signed)
Hematology and Oncology Follow Up Visit  Tyler Phillips [id number] June 14, 1965 53 y.o. 07/21/2018   Principle Diagnosis:  IgG Kappa myeloma - +4, +14, +17  Past Therapy: Status post autologous stem cell transplant on 05/22/2015 S/pcycle 6 of RVD  Current Therapy:   Velcade q 2wk dosing/Revlimid 10mg  po q day (21/7)    Interim History:  [name] is here today for follow-up and treatment. He continues to do well and has no complaints at this time.  With last visit his M-spike was not observed, IgG level was 1,285 mg/dL and 2.63 mg/dL.  No episodes of bleeding, no bruising or petechiae.  He is taking zyrtech daily and his rash has resolved.  No fever, chills, n/v, cough, rash, dizziness, SOB, chest pain, palpitations, abdominal pain or changes in bowel or bladder habits.  Very little to no GERD now that he is taking the 4 coated aspirin daily.  No swelling or tenderness in his extremities.  The neuropathy in his feet is unchanged.  No falls or syncopal episodes to report.  He has a good appetite and is stay hydrated. His weight is stable.   ECOG Performance Status: 1 - Symptomatic but completely ambulatory  Medications:  Allergies as of 07/21/2018   No Known Allergies     Medication List       Accurate as of July 21, 2018 10:47 AM. Always use your most recent med list.        aspirin 325 MG tablet Take 325 mg by mouth daily.   bortezomib IV 3.5 MG injection Commonly known as:  VELCADE Inject as directed every 14 (fourteen) days.   cetirizine 10 MG tablet Commonly known as:  ZYRTEC Take 10 mg by mouth daily.   clindamycin 300 MG capsule Commonly known as:  CLEOCIN Take 600 mg by mouth See admin instructions. 1 hour prior to dental appts   lenalidomide 10 MG capsule Commonly known as:  REVLIMID Take 1 capsule by mouth daily for 21 days on and 7 days off. Auth. #[id number]   LORazepam 1 MG tablet Commonly known as:  ATIVAN Take 1 mg by mouth every 4 (four) hours as  needed.   oxyCODONE 5 MG immediate release tablet Commonly known as:  Oxy IR/ROXICODONE Take 5 mg by mouth every 4 (four) hours as needed.   penicillin v potassium 500 MG tablet Commonly known as:  VEETID TAKE ONE TABLET (500 MG TOTAL) BY MOUTH EVERY 6 HOURS FOR 7 DAYS.   prochlorperazine 10 MG tablet Commonly known as:  COMPAZINE Take 10 mg by mouth every 6 (six) hours as needed.   valACYclovir 500 MG tablet Commonly known as:  VALTREX Take 500 mg by mouth 2 (two) times daily.   Vitamin D3 25 MCG (1000 UT) Caps Take by mouth.       Allergies: No Known Allergies  Past Medical History, Surgical history, Social history, and Family History were reviewed and updated.  Review of Systems: All other 10 point review of systems is negative.   Physical Exam:  vitals were not taken for this visit.   Wt Readings from Last 3 Encounters:  06/23/18 224 lb 12 oz (101.9 kg)  05/26/18 224 lb (101.6 kg)  04/28/18 221 lb 8 oz (100.5 kg)    Ocular: Sclerae unicteric, pupils equal, round and reactive to light Ear-nose-throat: Oropharynx clear, dentition fair Lymphatic: No cervical, supraclavicular or axillary adenopathy Lungs no rales or rhonchi, good excursion bilaterally Heart regular rate and rhythm, no murmur appreciated Abd  soft, nontender, positive bowel sounds, no liver or spleen tip palpated on exam, no fluid wave  MSK no focal spinal tenderness, no joint edema Neuro: non-focal, well-oriented, appropriate affect Breasts: Deferred   Lab Results  Component Value Date   WBC 2.9 (L) 07/21/2018   HGB 13.7 07/21/2018   HCT 38.8 (L) 07/21/2018   MCV 101.3 (H) 07/21/2018   PLT 145 (L) 07/21/2018   Lab Results  Component Value Date   FERRITIN 1,263 (H) 07/20/2014   IRON 125 07/20/2014   TIBC 198 (L) 07/20/2014   UIBC 73 (L) 07/20/2014   IRONPCTSAT 63 (H) 07/20/2014   Lab Results  Component Value Date   RETICCTPCT 0.8 07/20/2014   RBC 3.83 (L) 07/21/2018   Lab Results   Component Value Date   KPAFRELGTCHN 26.3 (H) 06/23/2018   LAMBDASER 19.0 06/23/2018   KAPLAMBRATIO 1.38 06/23/2018   Lab Results  Component Value Date   IGGSERUM 1,285 06/23/2018   IGA 244 06/23/2018   IGMSERUM 20 06/23/2018   Lab Results  Component Value Date   TOTALPROTELP 7.0 06/23/2018   ALBUMINELP 4.1 06/23/2018   A1GS 0.2 06/23/2018   A2GS 0.6 06/23/2018   BETS 0.9 06/23/2018   BETA2SER 0.3 03/28/2015   GAMS 1.2 06/23/2018   MSPIKE Not Observed 06/23/2018   SPEI Comment 03/03/2018     Chemistry      Component Value Date/Time   NA 137 07/07/2018 0954   NA 140 04/22/2017 1140   NA 138 09/03/2015 1042   K 3.9 07/07/2018 0954   K 4.0 04/22/2017 1140   K 4.3 09/03/2015 1042   CL 105 07/07/2018 0954   CL 105 04/22/2017 1140   CO2 25 07/07/2018 0954   CO2 24 04/22/2017 1140   CO2 25 09/03/2015 1042   BUN 15 07/07/2018 0954   BUN 16 04/22/2017 1140   BUN 21.7 09/03/2015 1042   CREATININE 1.27 (H) 07/07/2018 0954   CREATININE 1.6 (H) 04/22/2017 1140   CREATININE 1.2 09/03/2015 1042      Component Value Date/Time   CALCIUM 8.6 (L) 07/07/2018 0954   CALCIUM 8.5 04/22/2017 1140   CALCIUM 9.1 09/03/2015 1042   ALKPHOS 55 07/07/2018 0954   ALKPHOS 59 04/22/2017 1140   ALKPHOS 42 09/03/2015 1042   AST 22 07/07/2018 0954   AST 27 09/03/2015 1042   ALT 51 (H) 07/07/2018 0954   ALT 45 04/22/2017 1140   ALT 41 09/03/2015 1042   BILITOT 0.7 07/07/2018 0954   BILITOT 0.54 09/03/2015 1042       Impression and Plan: [name] is a pleasant 53 yo caucasian gentleman with IgG kappa myeloma. He completed induction chemotherapy with RVD followed by an autologous stem cell transplant at [facility] on February 2017. He continues to do well in remission.  We will proceed with Velcade today as planned.  He will stay on his same schedule and follow-up in 4 weeks.  He will contact our office with any questions or concerns. We can certainly see her sooner if need be.   Laverna Peace, NP 4/3/202010:47 AM

## 2018-07-21 NOTE — Addendum Note (Signed)
Addended by: Burney Gauze R on: 07/21/2018 11:16 AM   Modules accepted: Orders

## 2018-07-21 NOTE — Telephone Encounter (Signed)
Appointments scheduled avs/calendar printed per 4/3 los

## 2018-07-21 NOTE — Patient Instructions (Signed)

## 2018-07-22 LAB — IGG, IGA, IGM
IgA: 248 mg/dL (ref 90–386)
IgG (Immunoglobin G), Serum: 1367 mg/dL (ref 603–1613)
IgM (Immunoglobulin M), Srm: 20 mg/dL (ref 20–172)

## 2018-07-24 LAB — PROTEIN ELECTROPHORESIS, SERUM
A/G Ratio: 1.3 (ref 0.7–1.7)
Albumin ELP: 4 g/dL (ref 2.9–4.4)
Alpha-1-Globulin: 0.2 g/dL (ref 0.0–0.4)
Alpha-2-Globulin: 0.6 g/dL (ref 0.4–1.0)
Beta Globulin: 1 g/dL (ref 0.7–1.3)
Gamma Globulin: 1.4 g/dL (ref 0.4–1.8)
Globulin, Total: 3.1 g/dL (ref 2.2–3.9)
Total Protein ELP: 7.1 g/dL (ref 6.0–8.5)

## 2018-07-24 LAB — KAPPA/LAMBDA LIGHT CHAINS
Kappa free light chain: 27.7 mg/L — ABNORMAL HIGH (ref 3.3–19.4)
Kappa, lambda light chain ratio: 1.38 (ref 0.26–1.65)
Lambda free light chains: 20.1 mg/L (ref 5.7–26.3)

## 2018-07-26 ENCOUNTER — Other Ambulatory Visit: Payer: BLUE CROSS/BLUE SHIELD

## 2018-07-26 ENCOUNTER — Ambulatory Visit: Payer: BLUE CROSS/BLUE SHIELD | Admitting: Hematology & Oncology

## 2018-07-26 ENCOUNTER — Ambulatory Visit: Payer: BLUE CROSS/BLUE SHIELD

## 2018-08-04 ENCOUNTER — Inpatient Hospital Stay: Payer: BLUE CROSS/BLUE SHIELD

## 2018-08-04 ENCOUNTER — Other Ambulatory Visit: Payer: Self-pay

## 2018-08-04 VITALS — BP 146/84 | HR 51 | Temp 97.9°F | Resp 18

## 2018-08-04 DIAGNOSIS — C9001 Multiple myeloma in remission: Secondary | ICD-10-CM

## 2018-08-04 DIAGNOSIS — Z5112 Encounter for antineoplastic immunotherapy: Secondary | ICD-10-CM | POA: Diagnosis not present

## 2018-08-04 DIAGNOSIS — Z9484 Stem cells transplant status: Secondary | ICD-10-CM | POA: Diagnosis not present

## 2018-08-04 DIAGNOSIS — Z79899 Other long term (current) drug therapy: Secondary | ICD-10-CM | POA: Diagnosis not present

## 2018-08-04 DIAGNOSIS — Z9221 Personal history of antineoplastic chemotherapy: Secondary | ICD-10-CM | POA: Diagnosis not present

## 2018-08-04 LAB — CBC WITH DIFFERENTIAL (CANCER CENTER ONLY)
Abs Immature Granulocytes: 0 10*3/uL (ref 0.00–0.07)
Basophils Absolute: 0 10*3/uL (ref 0.0–0.1)
Basophils Relative: 1 %
Eosinophils Absolute: 0.1 10*3/uL (ref 0.0–0.5)
Eosinophils Relative: 3 %
HCT: 37.8 % — ABNORMAL LOW (ref 39.0–52.0)
Hemoglobin: 13.7 g/dL (ref 13.0–17.0)
Immature Granulocytes: 0 %
Lymphocytes Relative: 30 %
Lymphs Abs: 0.9 10*3/uL (ref 0.7–4.0)
MCH: 37.1 pg — ABNORMAL HIGH (ref 26.0–34.0)
MCHC: 36.2 g/dL — ABNORMAL HIGH (ref 30.0–36.0)
MCV: 102.4 fL — ABNORMAL HIGH (ref 80.0–100.0)
Monocytes Absolute: 0.3 10*3/uL (ref 0.1–1.0)
Monocytes Relative: 11 %
Neutro Abs: 1.8 10*3/uL (ref 1.7–7.7)
Neutrophils Relative %: 55 %
Platelet Count: 139 10*3/uL — ABNORMAL LOW (ref 150–400)
RBC: 3.69 MIL/uL — ABNORMAL LOW (ref 4.22–5.81)
RDW: 12.9 % (ref 11.5–15.5)
WBC Count: 3.2 10*3/uL — ABNORMAL LOW (ref 4.0–10.5)
nRBC: 0 % (ref 0.0–0.2)

## 2018-08-04 LAB — CMP (CANCER CENTER ONLY)
ALT: 71 U/L — ABNORMAL HIGH (ref 0–44)
AST: 35 U/L (ref 15–41)
Albumin: 4.4 g/dL (ref 3.5–5.0)
Alkaline Phosphatase: 54 U/L (ref 38–126)
Anion gap: 7 (ref 5–15)
BUN: 21 mg/dL — ABNORMAL HIGH (ref 6–20)
CO2: 23 mmol/L (ref 22–32)
Calcium: 9.1 mg/dL (ref 8.9–10.3)
Chloride: 103 mmol/L (ref 98–111)
Creatinine: 1.3 mg/dL — ABNORMAL HIGH (ref 0.61–1.24)
GFR, Est AFR Am: >60 mL/min (ref >60)
GFR, Estimated: >60 mL/min (ref >60)
Glucose, Bld: 86 mg/dL (ref 70–99)
Potassium: 3.8 mmol/L (ref 3.5–5.1)
Sodium: 133 mmol/L — ABNORMAL LOW (ref 135–145)
Total Bilirubin: 0.4 mg/dL (ref 0.3–1.2)
Total Protein: 7 g/dL (ref 6.5–8.1)

## 2018-08-04 MED ORDER — BORTEZOMIB CHEMO SQ INJECTION 3.5 MG (2.5MG/ML)
3.0000 mg | Freq: Once | INTRAMUSCULAR | Status: AC
  Administered 2018-08-04: 3 mg via SUBCUTANEOUS
  Filled 2018-08-04: qty 1.2

## 2018-08-04 NOTE — Patient Instructions (Signed)
[facility] Discharge Instructions for Patients Receiving Chemotherapy  Today you received the following chemotherapy agents Velcade To help prevent nausea and vomiting after your treatment, we encourage you to take your nausea medication as prescribed.   If you develop nausea and vomiting that is not controlled by your nausea medication, call the clinic.   BELOW ARE SYMPTOMS THAT SHOULD BE REPORTED IMMEDIATELY:  *FEVER GREATER THAN 100.5 F  *CHILLS WITH OR WITHOUT FEVER  NAUSEA AND VOMITING THAT IS NOT CONTROLLED WITH YOUR NAUSEA MEDICATION  *UNUSUAL SHORTNESS OF BREATH  *UNUSUAL BRUISING OR BLEEDING  TENDERNESS IN MOUTH AND THROAT WITH OR WITHOUT PRESENCE OF ULCERS  *URINARY PROBLEMS  *BOWEL PROBLEMS  UNUSUAL RASH Items with * indicate a potential emergency and should be followed up as soon as possible.  Feel free to call the clinic should you have any questions or concerns. The clinic phone number is [phone].  Please show the CHEMO ALERT CARD at check-in to the Emergency Department and triage nurse.   

## 2018-08-08 ENCOUNTER — Other Ambulatory Visit: Payer: Self-pay | Admitting: *Deleted

## 2018-08-08 DIAGNOSIS — C9002 Multiple myeloma in relapse: Secondary | ICD-10-CM

## 2018-08-08 MED ORDER — LENALIDOMIDE 10 MG PO CAPS: ORAL_CAPSULE | ORAL | 0 refills | Status: DC

## 2018-08-18 ENCOUNTER — Inpatient Hospital Stay (HOSPITAL_BASED_OUTPATIENT_CLINIC_OR_DEPARTMENT_OTHER): Payer: BLUE CROSS/BLUE SHIELD | Admitting: Hematology & Oncology

## 2018-08-18 ENCOUNTER — Encounter: Payer: Self-pay | Admitting: Hematology & Oncology

## 2018-08-18 ENCOUNTER — Other Ambulatory Visit: Payer: BLUE CROSS/BLUE SHIELD

## 2018-08-18 ENCOUNTER — Inpatient Hospital Stay: Payer: BLUE CROSS/BLUE SHIELD | Attending: Hematology & Oncology

## 2018-08-18 ENCOUNTER — Other Ambulatory Visit: Payer: Self-pay

## 2018-08-18 ENCOUNTER — Ambulatory Visit: Payer: BLUE CROSS/BLUE SHIELD

## 2018-08-18 ENCOUNTER — Inpatient Hospital Stay: Payer: BLUE CROSS/BLUE SHIELD

## 2018-08-18 VITALS — BP 141/82 | HR 53 | Temp 98.1°F | Resp 20 | Wt 222.8 lb

## 2018-08-18 DIAGNOSIS — C9001 Multiple myeloma in remission: Secondary | ICD-10-CM

## 2018-08-18 DIAGNOSIS — Z79899 Other long term (current) drug therapy: Secondary | ICD-10-CM

## 2018-08-18 DIAGNOSIS — Z9484 Stem cells transplant status: Secondary | ICD-10-CM | POA: Diagnosis not present

## 2018-08-18 LAB — CBC WITH DIFFERENTIAL (CANCER CENTER ONLY)
Abs Immature Granulocytes: 0 10*3/uL (ref 0.00–0.07)
Basophils Absolute: 0 10*3/uL (ref 0.0–0.1)
Basophils Relative: 1 %
Eosinophils Absolute: 0 10*3/uL (ref 0.0–0.5)
Eosinophils Relative: 1 %
HCT: 36.9 % — ABNORMAL LOW (ref 39.0–52.0)
Hemoglobin: 13.5 g/dL (ref 13.0–17.0)
Immature Granulocytes: 0 %
Lymphocytes Relative: 34 %
Lymphs Abs: 0.9 10*3/uL (ref 0.7–4.0)
MCH: 36.6 pg — ABNORMAL HIGH (ref 26.0–34.0)
MCHC: 36.6 g/dL — ABNORMAL HIGH (ref 30.0–36.0)
MCV: 100 fL (ref 80.0–100.0)
Monocytes Absolute: 0.3 10*3/uL (ref 0.1–1.0)
Monocytes Relative: 10 %
Neutro Abs: 1.5 10*3/uL — ABNORMAL LOW (ref 1.7–7.7)
Neutrophils Relative %: 54 %
Platelet Count: 153 10*3/uL (ref 150–400)
RBC: 3.69 MIL/uL — ABNORMAL LOW (ref 4.22–5.81)
RDW: 13 % (ref 11.5–15.5)
WBC Count: 2.7 10*3/uL — ABNORMAL LOW (ref 4.0–10.5)
nRBC: 0 % (ref 0.0–0.2)

## 2018-08-18 LAB — CMP (CANCER CENTER ONLY)
ALT: 59 U/L — ABNORMAL HIGH (ref 0–44)
AST: 26 U/L (ref 15–41)
Albumin: 4.3 g/dL (ref 3.5–5.0)
Alkaline Phosphatase: 52 U/L (ref 38–126)
Anion gap: 7 (ref 5–15)
BUN: 15 mg/dL (ref 6–20)
CO2: 22 mmol/L (ref 22–32)
Calcium: 9.2 mg/dL (ref 8.9–10.3)
Chloride: 107 mmol/L (ref 98–111)
Creatinine: 1.16 mg/dL (ref 0.61–1.24)
GFR, Est AFR Am: >60 mL/min (ref >60)
GFR, Estimated: >60 mL/min (ref >60)
Glucose, Bld: 99 mg/dL (ref 70–99)
Potassium: 4.3 mmol/L (ref 3.5–5.1)
Sodium: 136 mmol/L (ref 135–145)
Total Bilirubin: 0.6 mg/dL (ref 0.3–1.2)
Total Protein: 7.1 g/dL (ref 6.5–8.1)

## 2018-08-18 LAB — LACTATE DEHYDROGENASE: LDH: 145 U/L (ref 98–192)

## 2018-08-18 MED ORDER — BORTEZOMIB CHEMO SQ INJECTION 3.5 MG (2.5MG/ML)
3.0000 mg | Freq: Once | INTRAMUSCULAR | Status: AC
  Administered 2018-08-18: 3 mg via SUBCUTANEOUS
  Filled 2018-08-18: qty 1.2

## 2018-08-18 NOTE — Patient Instructions (Signed)

## 2018-08-18 NOTE — Progress Notes (Signed)
Hematology and Oncology Follow Up Visit  Tyler Phillips [id number] Sep 05, 1965 53 y.o. 08/18/2018   Principle Diagnosis:  IgG Kappa myeloma - +4, +14, +17  Current Therapy:   Status post autologous stem cell transplant on 05/22/2015  S/p cycle 6 of RVD  Velcade q 2wk dosing/Revlimid 10mg  po q day (21/7)    Interim History:  [name] is here today for follow-up.  The big news with [name] is that he put in his resignation for [employer].  It will be in August.  He says now is the time for him to retire.  I certainly applaud him for doing this.  He has been with him for a long time.  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  He currently is involved with a lot of projects.  He is doing well with the myeloma.  His myeloma was still in remission back in early April.  His M spike was not observed.  His IgG level was 1367 mg/dL.  His kappa light chain was 2.8 mg/dL.  He is managing the coronavirus.  [place] so he is a little bit isolated which is nice for him.  He has had no problems with fever.  He has had no cough.  There is been no change in bowel or bladder habits.  He is still doing the Velcade every 2 weeks.  This seems to be working well with him.  He has had no neuropathy.  His appetite is quite good.  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  This really bothers him.  Overall, his performance status is ECOG 0.    Medications:  Allergies as of 08/18/2018   No Known Allergies     Medication List       Accurate as of Aug 18, 2018 11:46 AM. Always use your most recent med list.        aspirin 325 MG tablet Take 325 mg by mouth daily.   bortezomib IV 3.5 MG injection Commonly known as:  VELCADE Inject as directed every 14 (fourteen) days.   cetirizine 10 MG tablet Commonly known as:  ZYRTEC Take 10 mg by mouth daily.   clindamycin 300 MG capsule Commonly known as:  CLEOCIN Take 600 mg by mouth See admin instructions. 1 hour prior to dental appts    lenalidomide 10 MG capsule Commonly known as:  REVLIMID Take 1 capsule by mouth daily for 21 days on and 7 days off. Auth. #[id number]   LORazepam 1 MG tablet Commonly known as:  ATIVAN Take 1 mg by mouth every 4 (four) hours as needed.   oxyCODONE 5 MG immediate release tablet Commonly known as:  Oxy IR/ROXICODONE Take 5 mg by mouth every 4 (four) hours as needed.   penicillin v potassium 500 MG tablet Commonly known as:  VEETID TAKE ONE TABLET (500 MG TOTAL) BY MOUTH EVERY 6 HOURS FOR 7 DAYS.   prochlorperazine 10 MG tablet Commonly known as:  COMPAZINE Take 10 mg by mouth every 6 (six) hours as needed.   valACYclovir 500 MG tablet Commonly known as:  VALTREX Take 500 mg by mouth 2 (two) times daily.   Vitamin D3 25 MCG (1000 UT) Caps Take by mouth.       Allergies: No Known Allergies  Past Medical History, Surgical history, Social history, and Family History were reviewed and updated.  Review of Systems: Review of Systems  Constitutional: Negative.   HENT: Negative.  Eyes: Negative.   Respiratory: Negative.   Cardiovascular: Negative.   Gastrointestinal: Negative.   Genitourinary: Negative.   Musculoskeletal: Negative.   Skin: Negative.   Neurological: Negative.   Endo/Heme/Allergies: Negative.   Psychiatric/Behavioral: Negative.      Physical Exam:  weight is 222 lb 12.8 oz (101.1 kg). His oral temperature is 98.1 F (36.7 C). His blood pressure is 141/82 (abnormal) and his pulse is 53 (abnormal). His respiration is 20 and oxygen saturation is 100%.   Wt Readings from Last 3 Encounters:  08/18/18 222 lb 12.8 oz (101.1 kg)  07/21/18 225 lb 12 oz (102.4 kg)  06/23/18 224 lb 12 oz (101.9 kg)    Physical Exam Vitals signs reviewed.  HENT:     Head: Normocephalic and atraumatic.  Eyes:     Pupils: Pupils are equal, round, and reactive to light.  Neck:     Musculoskeletal: Normal range of motion.  Cardiovascular:     Rate and Rhythm: Normal rate and  regular rhythm.     Heart sounds: Normal heart sounds.  Pulmonary:     Effort: Pulmonary effort is normal.     Breath sounds: Normal breath sounds.  Abdominal:     General: Bowel sounds are normal.     Palpations: Abdomen is soft.  Musculoskeletal: Normal range of motion.        General: No tenderness or deformity.  Lymphadenopathy:     Cervical: No cervical adenopathy.  Skin:    General: Skin is warm and dry.     Findings: No erythema or rash.  Neurological:     Mental Status: He is alert and oriented to person, place, and time.  Psychiatric:        Behavior: Behavior normal.        Thought Content: Thought content normal.        Judgment: Judgment normal.      Lab Results  Component Value Date   WBC 2.7 (L) 08/18/2018   HGB 13.5 08/18/2018   HCT 36.9 (L) 08/18/2018   MCV 100.0 08/18/2018   PLT 153 08/18/2018   Lab Results  Component Value Date   FERRITIN 1,263 (H) 07/20/2014   IRON 125 07/20/2014   TIBC 198 (L) 07/20/2014   UIBC 73 (L) 07/20/2014   IRONPCTSAT 63 (H) 07/20/2014   Lab Results  Component Value Date   RETICCTPCT 0.8 07/20/2014   RBC 3.69 (L) 08/18/2018   Lab Results  Component Value Date   KPAFRELGTCHN 27.7 (H) 07/21/2018   LAMBDASER 20.1 07/21/2018   KAPLAMBRATIO 1.38 07/21/2018   Lab Results  Component Value Date   IGGSERUM 1,367 07/21/2018   IGA 248 07/21/2018   IGMSERUM 20 07/21/2018   Lab Results  Component Value Date   TOTALPROTELP 7.1 07/21/2018   ALBUMINELP 4.0 07/21/2018   A1GS 0.2 07/21/2018   A2GS 0.6 07/21/2018   BETS 1.0 07/21/2018   BETA2SER 0.3 03/28/2015   GAMS 1.4 07/21/2018   MSPIKE Not Observed 07/21/2018   SPEI Comment 07/21/2018     Chemistry      Component Value Date/Time   NA 136 08/18/2018 1045   NA 140 04/22/2017 1140   NA 138 09/03/2015 1042   K 4.3 08/18/2018 1045   K 4.0 04/22/2017 1140   K 4.3 09/03/2015 1042   CL 107 08/18/2018 1045   CL 105 04/22/2017 1140   CO2 22 08/18/2018 1045   CO2 24  04/22/2017 1140   CO2 25 09/03/2015 1042   BUN  15 08/18/2018 1045   BUN 16 04/22/2017 1140   BUN 21.7 09/03/2015 1042   CREATININE 1.16 08/18/2018 1045   CREATININE 1.6 (H) 04/22/2017 1140   CREATININE 1.2 09/03/2015 1042      Component Value Date/Time   CALCIUM 9.2 08/18/2018 1045   CALCIUM 8.5 04/22/2017 1140   CALCIUM 9.1 09/03/2015 1042   ALKPHOS 52 08/18/2018 1045   ALKPHOS 59 04/22/2017 1140   ALKPHOS 42 09/03/2015 1042   AST 26 08/18/2018 1045   AST 27 09/03/2015 1042   ALT 59 (H) 08/18/2018 1045   ALT 45 04/22/2017 1140   ALT 41 09/03/2015 1042   BILITOT 0.6 08/18/2018 1045   BILITOT 0.54 09/03/2015 1042      Impression and Plan: [name] is a pleasant 53 yo caucasian gentleman with IgG kappa myeloma.  He underwent induction chemotherapy with RVD.  He then underwent an autologous stem cell transplant at [facility] on February 2017.  I am very pleased that he is done so well.  He is now over 3 years out from his transplant.  His myeloma levels are not found in his blood.  At some point, if he can maintain his remission, we might think about moving his Velcade to every 3 weeks.  Right now, we will have his Velcade in 2 weeks.  I will see him back in 6 weeks.      [name], MD 5/1/202011:46 AM

## 2018-08-19 LAB — IGG, IGA, IGM
IgA: 247 mg/dL (ref 90–386)
IgG (Immunoglobin G), Serum: 1315 mg/dL (ref 603–1613)
IgM (Immunoglobulin M), Srm: 19 mg/dL — ABNORMAL LOW (ref 20–172)

## 2018-08-21 LAB — PROTEIN ELECTROPHORESIS, SERUM
A/G Ratio: 1.2 (ref 0.7–1.7)
Albumin ELP: 3.9 g/dL (ref 2.9–4.4)
Alpha-1-Globulin: 0.2 g/dL (ref 0.0–0.4)
Alpha-2-Globulin: 0.7 g/dL (ref 0.4–1.0)
Beta Globulin: 1 g/dL (ref 0.7–1.3)
Gamma Globulin: 1.4 g/dL (ref 0.4–1.8)
Globulin, Total: 3.3 g/dL (ref 2.2–3.9)
Total Protein ELP: 7.2 g/dL (ref 6.0–8.5)

## 2018-08-21 LAB — KAPPA/LAMBDA LIGHT CHAINS
Kappa free light chain: 28.9 mg/L — ABNORMAL HIGH (ref 3.3–19.4)
Kappa, lambda light chain ratio: 1.46 (ref 0.26–1.65)
Lambda free light chains: 19.8 mg/L (ref 5.7–26.3)

## 2018-09-01 ENCOUNTER — Other Ambulatory Visit: Payer: Self-pay

## 2018-09-01 ENCOUNTER — Inpatient Hospital Stay: Payer: BLUE CROSS/BLUE SHIELD

## 2018-09-01 VITALS — BP 133/92 | HR 62 | Temp 98.1°F | Resp 18

## 2018-09-01 DIAGNOSIS — C9001 Multiple myeloma in remission: Secondary | ICD-10-CM

## 2018-09-01 DIAGNOSIS — Z9484 Stem cells transplant status: Secondary | ICD-10-CM | POA: Diagnosis not present

## 2018-09-01 DIAGNOSIS — Z79899 Other long term (current) drug therapy: Secondary | ICD-10-CM | POA: Diagnosis not present

## 2018-09-01 LAB — CBC WITH DIFFERENTIAL (CANCER CENTER ONLY)
Abs Immature Granulocytes: 0.01 10*3/uL (ref 0.00–0.07)
Basophils Absolute: 0 10*3/uL (ref 0.0–0.1)
Basophils Relative: 1 %
Eosinophils Absolute: 0.1 10*3/uL (ref 0.0–0.5)
Eosinophils Relative: 1 %
HCT: 38.6 % — ABNORMAL LOW (ref 39.0–52.0)
Hemoglobin: 13.9 g/dL (ref 13.0–17.0)
Immature Granulocytes: 0 %
Lymphocytes Relative: 26 %
Lymphs Abs: 0.9 10*3/uL (ref 0.7–4.0)
MCH: 36.1 pg — ABNORMAL HIGH (ref 26.0–34.0)
MCHC: 36 g/dL (ref 30.0–36.0)
MCV: 100.3 fL — ABNORMAL HIGH (ref 80.0–100.0)
Monocytes Absolute: 0.3 10*3/uL (ref 0.1–1.0)
Monocytes Relative: 7 %
Neutro Abs: 2.4 10*3/uL (ref 1.7–7.7)
Neutrophils Relative %: 65 %
Platelet Count: 164 10*3/uL (ref 150–400)
RBC: 3.85 MIL/uL — ABNORMAL LOW (ref 4.22–5.81)
RDW: 12.8 % (ref 11.5–15.5)
WBC Count: 3.7 10*3/uL — ABNORMAL LOW (ref 4.0–10.5)
nRBC: 0 % (ref 0.0–0.2)

## 2018-09-01 LAB — CMP (CANCER CENTER ONLY)
ALT: 66 U/L — ABNORMAL HIGH (ref 0–44)
AST: 30 U/L (ref 15–41)
Albumin: 4.5 g/dL (ref 3.5–5.0)
Alkaline Phosphatase: 58 U/L (ref 38–126)
Anion gap: 9 (ref 5–15)
BUN: 17 mg/dL (ref 6–20)
CO2: 23 mmol/L (ref 22–32)
Calcium: 9 mg/dL (ref 8.9–10.3)
Chloride: 105 mmol/L (ref 98–111)
Creatinine: 1.32 mg/dL — ABNORMAL HIGH (ref 0.61–1.24)
GFR, Est AFR Am: >60 mL/min (ref >60)
GFR, Estimated: >60 mL/min (ref >60)
Glucose, Bld: 92 mg/dL (ref 70–99)
Potassium: 4 mmol/L (ref 3.5–5.1)
Sodium: 137 mmol/L (ref 135–145)
Total Bilirubin: 0.6 mg/dL (ref 0.3–1.2)
Total Protein: 7.1 g/dL (ref 6.5–8.1)

## 2018-09-01 MED ORDER — BORTEZOMIB CHEMO SQ INJECTION 3.5 MG (2.5MG/ML)
3.0000 mg | Freq: Once | INTRAMUSCULAR | Status: AC
  Administered 2018-09-01: 3 mg via SUBCUTANEOUS
  Filled 2018-09-01: qty 1.2

## 2018-09-01 NOTE — Patient Instructions (Signed)

## 2018-09-06 ENCOUNTER — Other Ambulatory Visit: Payer: Self-pay | Admitting: *Deleted

## 2018-09-06 DIAGNOSIS — C9002 Multiple myeloma in relapse: Secondary | ICD-10-CM

## 2018-09-06 MED ORDER — LENALIDOMIDE 10 MG PO CAPS: ORAL_CAPSULE | ORAL | 0 refills | Status: DC

## 2018-09-08 ENCOUNTER — Other Ambulatory Visit: Payer: Self-pay | Admitting: *Deleted

## 2018-09-08 DIAGNOSIS — C9002 Multiple myeloma in relapse: Secondary | ICD-10-CM

## 2018-09-08 MED ORDER — LENALIDOMIDE 10 MG PO CAPS: ORAL_CAPSULE | ORAL | 0 refills | Status: DC

## 2018-09-14 ENCOUNTER — Other Ambulatory Visit: Payer: Self-pay | Admitting: *Deleted

## 2018-09-14 DIAGNOSIS — C9001 Multiple myeloma in remission: Secondary | ICD-10-CM

## 2018-09-15 ENCOUNTER — Inpatient Hospital Stay: Payer: BLUE CROSS/BLUE SHIELD

## 2018-09-15 ENCOUNTER — Ambulatory Visit: Payer: BLUE CROSS/BLUE SHIELD | Admitting: Family

## 2018-09-15 ENCOUNTER — Other Ambulatory Visit: Payer: Self-pay

## 2018-09-15 VITALS — BP 142/91 | HR 58 | Temp 97.8°F

## 2018-09-15 DIAGNOSIS — Z79899 Other long term (current) drug therapy: Secondary | ICD-10-CM | POA: Diagnosis not present

## 2018-09-15 DIAGNOSIS — C9001 Multiple myeloma in remission: Secondary | ICD-10-CM | POA: Diagnosis not present

## 2018-09-15 DIAGNOSIS — Z9484 Stem cells transplant status: Secondary | ICD-10-CM | POA: Diagnosis not present

## 2018-09-15 LAB — CBC WITH DIFFERENTIAL (CANCER CENTER ONLY)
Abs Immature Granulocytes: 0.01 10*3/uL (ref 0.00–0.07)
Basophils Absolute: 0 10*3/uL (ref 0.0–0.1)
Basophils Relative: 0 %
Eosinophils Absolute: 0.1 10*3/uL (ref 0.0–0.5)
Eosinophils Relative: 2 %
HCT: 36.9 % — ABNORMAL LOW (ref 39.0–52.0)
Hemoglobin: 13.3 g/dL (ref 13.0–17.0)
Immature Granulocytes: 0 %
Lymphocytes Relative: 24 %
Lymphs Abs: 0.8 10*3/uL (ref 0.7–4.0)
MCH: 36.3 pg — ABNORMAL HIGH (ref 26.0–34.0)
MCHC: 36 g/dL (ref 30.0–36.0)
MCV: 100.8 fL — ABNORMAL HIGH (ref 80.0–100.0)
Monocytes Absolute: 0.3 10*3/uL (ref 0.1–1.0)
Monocytes Relative: 10 %
Neutro Abs: 2 10*3/uL (ref 1.7–7.7)
Neutrophils Relative %: 64 %
Platelet Count: 133 10*3/uL — ABNORMAL LOW (ref 150–400)
RBC: 3.66 MIL/uL — ABNORMAL LOW (ref 4.22–5.81)
RDW: 12.7 % (ref 11.5–15.5)
WBC Count: 3.1 10*3/uL — ABNORMAL LOW (ref 4.0–10.5)
nRBC: 0 % (ref 0.0–0.2)

## 2018-09-15 LAB — COMPREHENSIVE METABOLIC PANEL
ALT: 69 U/L — ABNORMAL HIGH (ref 0–44)
AST: 28 U/L (ref 15–41)
Albumin: 4.3 g/dL (ref 3.5–5.0)
Alkaline Phosphatase: 58 U/L (ref 38–126)
Anion gap: 8 (ref 5–15)
BUN: 14 mg/dL (ref 6–20)
CO2: 24 mmol/L (ref 22–32)
Calcium: 8.5 mg/dL — ABNORMAL LOW (ref 8.9–10.3)
Chloride: 105 mmol/L (ref 98–111)
Creatinine, Ser: 1.69 mg/dL — ABNORMAL HIGH (ref 0.61–1.24)
GFR calc Af Amer: 53 mL/min — ABNORMAL LOW (ref >60)
GFR calc non Af Amer: 45 mL/min — ABNORMAL LOW (ref >60)
Glucose, Bld: 112 mg/dL — ABNORMAL HIGH (ref 70–99)
Potassium: 4 mmol/L (ref 3.5–5.1)
Sodium: 137 mmol/L (ref 135–145)
Total Bilirubin: 0.8 mg/dL (ref 0.3–1.2)
Total Protein: 7.1 g/dL (ref 6.5–8.1)

## 2018-09-15 MED ORDER — BORTEZOMIB CHEMO SQ INJECTION 3.5 MG (2.5MG/ML)
3.0000 mg | Freq: Once | INTRAMUSCULAR | Status: AC
  Administered 2018-09-15: 3 mg via SUBCUTANEOUS
  Filled 2018-09-15: qty 1.2

## 2018-09-15 NOTE — Patient Instructions (Signed)

## 2018-09-29 ENCOUNTER — Inpatient Hospital Stay: Payer: BC Managed Care – PPO

## 2018-09-29 ENCOUNTER — Inpatient Hospital Stay: Payer: BC Managed Care – PPO | Attending: Hematology & Oncology | Admitting: Hematology & Oncology

## 2018-09-29 ENCOUNTER — Other Ambulatory Visit: Payer: Self-pay

## 2018-09-29 ENCOUNTER — Encounter: Payer: Self-pay | Admitting: Hematology & Oncology

## 2018-09-29 VITALS — BP 120/78 | HR 54 | Temp 98.2°F | Resp 18 | Wt 218.0 lb

## 2018-09-29 DIAGNOSIS — Z79899 Other long term (current) drug therapy: Secondary | ICD-10-CM | POA: Diagnosis not present

## 2018-09-29 DIAGNOSIS — Z5112 Encounter for antineoplastic immunotherapy: Secondary | ICD-10-CM | POA: Diagnosis not present

## 2018-09-29 DIAGNOSIS — C9001 Multiple myeloma in remission: Secondary | ICD-10-CM | POA: Insufficient documentation

## 2018-09-29 DIAGNOSIS — Z7982 Long term (current) use of aspirin: Secondary | ICD-10-CM

## 2018-09-29 DIAGNOSIS — Z9484 Stem cells transplant status: Secondary | ICD-10-CM | POA: Diagnosis not present

## 2018-09-29 LAB — CBC WITH DIFFERENTIAL (CANCER CENTER ONLY)
Abs Immature Granulocytes: 0.01 10*3/uL (ref 0.00–0.07)
Basophils Absolute: 0 10*3/uL (ref 0.0–0.1)
Basophils Relative: 0 %
Eosinophils Absolute: 0.1 10*3/uL (ref 0.0–0.5)
Eosinophils Relative: 2 %
HCT: 37.2 % — ABNORMAL LOW (ref 39.0–52.0)
Hemoglobin: 13.1 g/dL (ref 13.0–17.0)
Immature Granulocytes: 0 %
Lymphocytes Relative: 31 %
Lymphs Abs: 0.8 10*3/uL (ref 0.7–4.0)
MCH: 36.3 pg — ABNORMAL HIGH (ref 26.0–34.0)
MCHC: 35.2 g/dL (ref 30.0–36.0)
MCV: 103 fL — ABNORMAL HIGH (ref 80.0–100.0)
Monocytes Absolute: 0.2 10*3/uL (ref 0.1–1.0)
Monocytes Relative: 7 %
Neutro Abs: 1.5 10*3/uL — ABNORMAL LOW (ref 1.7–7.7)
Neutrophils Relative %: 60 %
Platelet Count: 146 10*3/uL — ABNORMAL LOW (ref 150–400)
RBC: 3.61 MIL/uL — ABNORMAL LOW (ref 4.22–5.81)
RDW: 13.3 % (ref 11.5–15.5)
WBC Count: 2.6 10*3/uL — ABNORMAL LOW (ref 4.0–10.5)
nRBC: 0 % (ref 0.0–0.2)

## 2018-09-29 LAB — CMP (CANCER CENTER ONLY)
ALT: 52 U/L — ABNORMAL HIGH (ref 0–44)
AST: 31 U/L (ref 15–41)
Albumin: 4.5 g/dL (ref 3.5–5.0)
Alkaline Phosphatase: 52 U/L (ref 38–126)
Anion gap: 7 (ref 5–15)
BUN: 12 mg/dL (ref 6–20)
CO2: 25 mmol/L (ref 22–32)
Calcium: 9.1 mg/dL (ref 8.9–10.3)
Chloride: 106 mmol/L (ref 98–111)
Creatinine: 1.35 mg/dL — ABNORMAL HIGH (ref 0.61–1.24)
GFR, Est AFR Am: >60 mL/min (ref >60)
GFR, Estimated: 60 mL/min — ABNORMAL LOW (ref >60)
Glucose, Bld: 91 mg/dL (ref 70–99)
Potassium: 4 mmol/L (ref 3.5–5.1)
Sodium: 138 mmol/L (ref 135–145)
Total Bilirubin: 0.8 mg/dL (ref 0.3–1.2)
Total Protein: 7.1 g/dL (ref 6.5–8.1)

## 2018-09-29 MED ORDER — BORTEZOMIB CHEMO SQ INJECTION 3.5 MG (2.5MG/ML)
3.0000 mg | Freq: Once | INTRAMUSCULAR | Status: AC
  Administered 2018-09-29: 3 mg via SUBCUTANEOUS
  Filled 2018-09-29: qty 1.2

## 2018-09-29 NOTE — Progress Notes (Signed)
Hematology and Oncology Follow Up Visit  Tyler Phillips [id number] [birth year] 53 y.o. 09/29/2018   Principle Diagnosis:  IgG Kappa myeloma - +4, +14, +17  Current Therapy:   Status post autologous stem cell transplant on 05/22/2015  S/p cycle 6 of RVD  Velcade q 2wk dosing/Revlimid 10mg  po q day (21/7)    Interim History:  [name] is here today for follow-up.  So far, he is doing quite well.  He is gearing up for his retirement.  The retirement is notably August 1.  He has been grossly busy.  I am sure that his retirement will be needed this year then his work life.  He and his family are still thinking about going out to [place] in August for I hiking trip to [place] and [facility].  He has had no problems with the Revlimid.  He has had no itching.  He has had no rashes.  He has had no change in bowel or bladder habits.  He is exercising he is doing his CrossFit now that gyms are starting to open up.  So far, his myeloma is still been in remission.  We last checked his myeloma studies in May, there was no monoclonal spike in his blood.    Overall, his performance status is ECOG 0.    Medications:  Allergies as of 09/29/2018   No Known Allergies     Medication List       Accurate as of September 29, 2018 12:09 PM. If you have any questions, ask your nurse or doctor.        aspirin 325 MG tablet Take 325 mg by mouth daily.   bortezomib IV 3.5 MG injection Commonly known as: VELCADE Inject as directed every 14 (fourteen) days.   cetirizine 10 MG tablet Commonly known as: ZYRTEC Take 10 mg by mouth daily.   clindamycin 300 MG capsule Commonly known as: CLEOCIN Take 600 mg by mouth See admin instructions. 1 hour prior to dental appts   lenalidomide 10 MG capsule Commonly known as: REVLIMID Take 1 capsule by mouth daily for 21 days on and 7 days off. Auth. #[id number]   LORazepam 1 MG tablet Commonly known as: ATIVAN Take 1 mg by mouth every 4 (four)  hours as needed.   oxyCODONE 5 MG immediate release tablet Commonly known as: Oxy IR/ROXICODONE Take 5 mg by mouth every 4 (four) hours as needed.   penicillin v potassium 500 MG tablet Commonly known as: VEETID TAKE ONE TABLET (500 MG TOTAL) BY MOUTH EVERY 6 HOURS FOR 7 DAYS.   prochlorperazine 10 MG tablet Commonly known as: COMPAZINE Take 10 mg by mouth every 6 (six) hours as needed.   valACYclovir 500 MG tablet Commonly known as: VALTREX Take 500 mg by mouth 2 (two) times daily.   Vitamin D3 25 MCG (1000 UT) Caps Take by mouth.       Allergies: No Known Allergies  Past Medical History, Surgical history, Social history, and Family History were reviewed and updated.  Review of Systems: Review of Systems  Constitutional: Negative.   HENT: Negative.   Eyes: Negative.   Respiratory: Negative.   Cardiovascular: Negative.   Gastrointestinal: Negative.   Genitourinary: Negative.   Musculoskeletal: Negative.   Skin: Negative.   Neurological: Negative.   Endo/Heme/Allergies: Negative.   Psychiatric/Behavioral: Negative.      Physical Exam:  weight is 218 lb (98.9 kg). His oral temperature is 98.2 F (36.8 C). His blood pressure is 120/78 and  his pulse is 54 (abnormal). His respiration is 18 and oxygen saturation is 99%.   Wt Readings from Last 3 Encounters:  09/29/18 218 lb (98.9 kg)  08/18/18 222 lb 12.8 oz (101.1 kg)  07/21/18 225 lb 12 oz (102.4 kg)    Physical Exam Vitals signs reviewed.  HENT:     Head: Normocephalic and atraumatic.  Eyes:     Pupils: Pupils are equal, round, and reactive to light.  Neck:     Musculoskeletal: Normal range of motion.  Cardiovascular:     Rate and Rhythm: Normal rate and regular rhythm.     Heart sounds: Normal heart sounds.  Pulmonary:     Effort: Pulmonary effort is normal.     Breath sounds: Normal breath sounds.  Abdominal:     General: Bowel sounds are normal.     Palpations: Abdomen is soft.  Musculoskeletal:  Normal range of motion.        General: No tenderness or deformity.  Lymphadenopathy:     Cervical: No cervical adenopathy.  Skin:    General: Skin is warm and dry.     Findings: No erythema or rash.  Neurological:     Mental Status: He is alert and oriented to person, place, and time.  Psychiatric:        Behavior: Behavior normal.        Thought Content: Thought content normal.        Judgment: Judgment normal.      Lab Results  Component Value Date   WBC 2.6 (L) 09/29/2018   HGB 13.1 09/29/2018   HCT 37.2 (L) 09/29/2018   MCV 103.0 (H) 09/29/2018   PLT 146 (L) 09/29/2018   Lab Results  Component Value Date   FERRITIN 1,263 (H) 07/20/2014   IRON 125 07/20/2014   TIBC 198 (L) 07/20/2014   UIBC 73 (L) 07/20/2014   IRONPCTSAT 63 (H) 07/20/2014   Lab Results  Component Value Date   RETICCTPCT 0.8 07/20/2014   RBC 3.61 (L) 09/29/2018   Lab Results  Component Value Date   KPAFRELGTCHN 28.9 (H) 08/18/2018   LAMBDASER 19.8 08/18/2018   KAPLAMBRATIO 1.46 08/18/2018   Lab Results  Component Value Date   IGGSERUM 1,315 08/18/2018   IGA 247 08/18/2018   IGMSERUM 19 (L) 08/18/2018   Lab Results  Component Value Date   TOTALPROTELP 7.2 08/18/2018   ALBUMINELP 3.9 08/18/2018   A1GS 0.2 08/18/2018   A2GS 0.7 08/18/2018   BETS 1.0 08/18/2018   BETA2SER 0.3 03/28/2015   GAMS 1.4 08/18/2018   MSPIKE Not Observed 08/18/2018   SPEI Comment 08/18/2018     Chemistry      Component Value Date/Time   NA 138 09/29/2018 1048   NA 140 04/22/2017 1140   NA 138 09/03/2015 1042   K 4.0 09/29/2018 1048   K 4.0 04/22/2017 1140   K 4.3 09/03/2015 1042   CL 106 09/29/2018 1048   CL 105 04/22/2017 1140   CO2 25 09/29/2018 1048   CO2 24 04/22/2017 1140   CO2 25 09/03/2015 1042   BUN 12 09/29/2018 1048   BUN 16 04/22/2017 1140   BUN 21.7 09/03/2015 1042   CREATININE 1.35 (H) 09/29/2018 1048   CREATININE 1.6 (H) 04/22/2017 1140   CREATININE 1.2 09/03/2015 1042       Component Value Date/Time   CALCIUM 9.1 09/29/2018 1048   CALCIUM 8.5 04/22/2017 1140   CALCIUM 9.1 09/03/2015 1042   ALKPHOS 52 09/29/2018 1048  ALKPHOS 59 04/22/2017 1140   ALKPHOS 42 09/03/2015 1042   AST 31 09/29/2018 1048   AST 27 09/03/2015 1042   ALT 52 (H) 09/29/2018 1048   ALT 45 04/22/2017 1140   ALT 41 09/03/2015 1042   BILITOT 0.8 09/29/2018 1048   BILITOT 0.54 09/03/2015 1042      Impression and Plan: [name] is a pleasant 53 yo caucasian gentleman with IgG kappa myeloma.  He underwent induction chemotherapy with RVD.  He then underwent an autologous stem cell transplant at [facility] on February 2017.  I am very pleased that he is done so well.  He is now over 3 years out from his transplant.  His myeloma levels are not found in his blood.  At some point, if he can maintain his remission, we might think about moving his Velcade to every 3 weeks.  Right now, we will have his Velcade in 2 weeks.  I will see him back in 6 weeks.      [name], MD 6/12/202012:09 PM

## 2018-09-29 NOTE — Patient Instructions (Signed)
[facility] Discharge Instructions for Patients Receiving Chemotherapy  Today you received the following chemotherapy agents Velcade To help prevent nausea and vomiting after your treatment, we encourage you to take your nausea medication as prescribed.   If you develop nausea and vomiting that is not controlled by your nausea medication, call the clinic.   BELOW ARE SYMPTOMS THAT SHOULD BE REPORTED IMMEDIATELY:  *FEVER GREATER THAN 100.5 F  *CHILLS WITH OR WITHOUT FEVER  NAUSEA AND VOMITING THAT IS NOT CONTROLLED WITH YOUR NAUSEA MEDICATION  *UNUSUAL SHORTNESS OF BREATH  *UNUSUAL BRUISING OR BLEEDING  TENDERNESS IN MOUTH AND THROAT WITH OR WITHOUT PRESENCE OF ULCERS  *URINARY PROBLEMS  *BOWEL PROBLEMS  UNUSUAL RASH Items with * indicate a potential emergency and should be followed up as soon as possible.  Feel free to call the clinic should you have any questions or concerns. The clinic phone number is [phone].  Please show the CHEMO ALERT CARD at check-in to the Emergency Department and triage nurse.   

## 2018-09-30 LAB — IGG, IGA, IGM
IgA: 216 mg/dL (ref 90–386)
IgG (Immunoglobin G), Serum: 1251 mg/dL (ref 603–1613)
IgM (Immunoglobulin M), Srm: 18 mg/dL — ABNORMAL LOW (ref 20–172)

## 2018-10-02 LAB — PROTEIN ELECTROPHORESIS, SERUM, WITH REFLEX
A/G Ratio: 1.3 (ref 0.7–1.7)
Albumin ELP: 3.7 g/dL (ref 2.9–4.4)
Alpha-1-Globulin: 0.2 g/dL (ref 0.0–0.4)
Alpha-2-Globulin: 0.5 g/dL (ref 0.4–1.0)
Beta Globulin: 0.9 g/dL (ref 0.7–1.3)
Gamma Globulin: 1.2 g/dL (ref 0.4–1.8)
Globulin, Total: 2.9 g/dL (ref 2.2–3.9)
Total Protein ELP: 6.6 g/dL (ref 6.0–8.5)

## 2018-10-02 LAB — KAPPA/LAMBDA LIGHT CHAINS
Kappa free light chain: 42.6 mg/L — ABNORMAL HIGH (ref 3.3–19.4)
Kappa, lambda light chain ratio: 1.99 — ABNORMAL HIGH (ref 0.26–1.65)
Lambda free light chains: 21.4 mg/L (ref 5.7–26.3)

## 2018-10-04 ENCOUNTER — Other Ambulatory Visit: Payer: Self-pay | Admitting: *Deleted

## 2018-10-04 DIAGNOSIS — C9002 Multiple myeloma in relapse: Secondary | ICD-10-CM

## 2018-10-04 MED ORDER — LENALIDOMIDE 10 MG PO CAPS: ORAL_CAPSULE | ORAL | 0 refills | Status: DC

## 2018-10-12 ENCOUNTER — Other Ambulatory Visit: Payer: Self-pay | Admitting: *Deleted

## 2018-10-12 DIAGNOSIS — C9000 Multiple myeloma not having achieved remission: Secondary | ICD-10-CM

## 2018-10-12 DIAGNOSIS — C9001 Multiple myeloma in remission: Secondary | ICD-10-CM

## 2018-10-13 ENCOUNTER — Inpatient Hospital Stay: Payer: BC Managed Care – PPO

## 2018-10-13 ENCOUNTER — Other Ambulatory Visit: Payer: Self-pay

## 2018-10-13 VITALS — BP 141/89 | HR 47 | Temp 98.6°F

## 2018-10-13 DIAGNOSIS — Z7982 Long term (current) use of aspirin: Secondary | ICD-10-CM | POA: Diagnosis not present

## 2018-10-13 DIAGNOSIS — C9001 Multiple myeloma in remission: Secondary | ICD-10-CM

## 2018-10-13 DIAGNOSIS — Z9484 Stem cells transplant status: Secondary | ICD-10-CM | POA: Diagnosis not present

## 2018-10-13 DIAGNOSIS — C9000 Multiple myeloma not having achieved remission: Secondary | ICD-10-CM

## 2018-10-13 DIAGNOSIS — Z79899 Other long term (current) drug therapy: Secondary | ICD-10-CM | POA: Diagnosis not present

## 2018-10-13 DIAGNOSIS — Z5112 Encounter for antineoplastic immunotherapy: Secondary | ICD-10-CM | POA: Diagnosis not present

## 2018-10-13 LAB — CBC WITH DIFFERENTIAL (CANCER CENTER ONLY)
Abs Immature Granulocytes: 0.03 10*3/uL (ref 0.00–0.07)
Basophils Absolute: 0 10*3/uL (ref 0.0–0.1)
Basophils Relative: 0 %
Eosinophils Absolute: 0.1 10*3/uL (ref 0.0–0.5)
Eosinophils Relative: 4 %
HCT: 36.8 % — ABNORMAL LOW (ref 39.0–52.0)
Hemoglobin: 13 g/dL (ref 13.0–17.0)
Immature Granulocytes: 1 %
Lymphocytes Relative: 33 %
Lymphs Abs: 1.1 10*3/uL (ref 0.7–4.0)
MCH: 36.2 pg — ABNORMAL HIGH (ref 26.0–34.0)
MCHC: 35.3 g/dL (ref 30.0–36.0)
MCV: 102.5 fL — ABNORMAL HIGH (ref 80.0–100.0)
Monocytes Absolute: 0.4 10*3/uL (ref 0.1–1.0)
Monocytes Relative: 12 %
Neutro Abs: 1.7 10*3/uL (ref 1.7–7.7)
Neutrophils Relative %: 50 %
Platelet Count: 132 10*3/uL — ABNORMAL LOW (ref 150–400)
RBC: 3.59 MIL/uL — ABNORMAL LOW (ref 4.22–5.81)
RDW: 12.9 % (ref 11.5–15.5)
WBC Count: 3.3 10*3/uL — ABNORMAL LOW (ref 4.0–10.5)
nRBC: 0 % (ref 0.0–0.2)

## 2018-10-13 LAB — CMP (CANCER CENTER ONLY)
ALT: 65 U/L — ABNORMAL HIGH (ref 0–44)
AST: 41 U/L (ref 15–41)
Albumin: 4.2 g/dL (ref 3.5–5.0)
Alkaline Phosphatase: 52 U/L (ref 38–126)
Anion gap: 7 (ref 5–15)
BUN: 15 mg/dL (ref 6–20)
CO2: 25 mmol/L (ref 22–32)
Calcium: 8.9 mg/dL (ref 8.9–10.3)
Chloride: 105 mmol/L (ref 98–111)
Creatinine: 1.28 mg/dL — ABNORMAL HIGH (ref 0.61–1.24)
GFR, Est AFR Am: >60 mL/min (ref >60)
GFR, Estimated: >60 mL/min (ref >60)
Glucose, Bld: 90 mg/dL (ref 70–99)
Potassium: 4.1 mmol/L (ref 3.5–5.1)
Sodium: 137 mmol/L (ref 135–145)
Total Bilirubin: 0.6 mg/dL (ref 0.3–1.2)
Total Protein: 6.9 g/dL (ref 6.5–8.1)

## 2018-10-13 MED ORDER — BORTEZOMIB CHEMO SQ INJECTION 3.5 MG (2.5MG/ML)
3.0000 mg | Freq: Once | INTRAMUSCULAR | Status: AC
  Administered 2018-10-13: 3 mg via SUBCUTANEOUS
  Filled 2018-10-13: qty 1.2

## 2018-10-13 NOTE — Patient Instructions (Signed)
[facility] Discharge Instructions for Patients Receiving Chemotherapy  Today you received the following chemotherapy agents Velcade To help prevent nausea and vomiting after your treatment, we encourage you to take your nausea medication as prescribed.   If you develop nausea and vomiting that is not controlled by your nausea medication, call the clinic.   BELOW ARE SYMPTOMS THAT SHOULD BE REPORTED IMMEDIATELY:  *FEVER GREATER THAN 100.5 F  *CHILLS WITH OR WITHOUT FEVER  NAUSEA AND VOMITING THAT IS NOT CONTROLLED WITH YOUR NAUSEA MEDICATION  *UNUSUAL SHORTNESS OF BREATH  *UNUSUAL BRUISING OR BLEEDING  TENDERNESS IN MOUTH AND THROAT WITH OR WITHOUT PRESENCE OF ULCERS  *URINARY PROBLEMS  *BOWEL PROBLEMS  UNUSUAL RASH Items with * indicate a potential emergency and should be followed up as soon as possible.  Feel free to call the clinic should you have any questions or concerns. The clinic phone number is [phone].  Please show the CHEMO ALERT CARD at check-in to the Emergency Department and triage nurse.   

## 2018-10-25 ENCOUNTER — Other Ambulatory Visit: Payer: Self-pay | Admitting: *Deleted

## 2018-10-25 DIAGNOSIS — C9002 Multiple myeloma in relapse: Secondary | ICD-10-CM

## 2018-10-25 MED ORDER — LENALIDOMIDE 10 MG PO CAPS: ORAL_CAPSULE | ORAL | 0 refills | Status: DC

## 2018-10-26 ENCOUNTER — Other Ambulatory Visit: Payer: Self-pay | Admitting: *Deleted

## 2018-10-26 DIAGNOSIS — C9001 Multiple myeloma in remission: Secondary | ICD-10-CM

## 2018-10-26 DIAGNOSIS — C9000 Multiple myeloma not having achieved remission: Secondary | ICD-10-CM

## 2018-10-27 ENCOUNTER — Inpatient Hospital Stay: Payer: BC Managed Care – PPO

## 2018-10-27 ENCOUNTER — Inpatient Hospital Stay: Payer: BC Managed Care – PPO | Attending: Hematology & Oncology

## 2018-10-27 ENCOUNTER — Other Ambulatory Visit: Payer: Self-pay

## 2018-10-27 VITALS — BP 141/89 | HR 63 | Temp 97.9°F | Resp 20

## 2018-10-27 DIAGNOSIS — Z5112 Encounter for antineoplastic immunotherapy: Secondary | ICD-10-CM | POA: Insufficient documentation

## 2018-10-27 DIAGNOSIS — Z79899 Other long term (current) drug therapy: Secondary | ICD-10-CM | POA: Insufficient documentation

## 2018-10-27 DIAGNOSIS — Z9484 Stem cells transplant status: Secondary | ICD-10-CM | POA: Insufficient documentation

## 2018-10-27 DIAGNOSIS — C9000 Multiple myeloma not having achieved remission: Secondary | ICD-10-CM | POA: Diagnosis not present

## 2018-10-27 DIAGNOSIS — C9001 Multiple myeloma in remission: Secondary | ICD-10-CM

## 2018-10-27 LAB — CBC WITH DIFFERENTIAL (CANCER CENTER ONLY)
Abs Immature Granulocytes: 0.01 10*3/uL (ref 0.00–0.07)
Basophils Absolute: 0 10*3/uL (ref 0.0–0.1)
Basophils Relative: 1 %
Eosinophils Absolute: 0.1 10*3/uL (ref 0.0–0.5)
Eosinophils Relative: 3 %
HCT: 38.1 % — ABNORMAL LOW (ref 39.0–52.0)
Hemoglobin: 13.7 g/dL (ref 13.0–17.0)
Immature Granulocytes: 0 %
Lymphocytes Relative: 29 %
Lymphs Abs: 0.8 10*3/uL (ref 0.7–4.0)
MCH: 37 pg — ABNORMAL HIGH (ref 26.0–34.0)
MCHC: 36 g/dL (ref 30.0–36.0)
MCV: 103 fL — ABNORMAL HIGH (ref 80.0–100.0)
Monocytes Absolute: 0.2 10*3/uL (ref 0.1–1.0)
Monocytes Relative: 6 %
Neutro Abs: 1.7 10*3/uL (ref 1.7–7.7)
Neutrophils Relative %: 61 %
Platelet Count: 157 10*3/uL (ref 150–400)
RBC: 3.7 MIL/uL — ABNORMAL LOW (ref 4.22–5.81)
RDW: 13.3 % (ref 11.5–15.5)
WBC Count: 2.7 10*3/uL — ABNORMAL LOW (ref 4.0–10.5)
nRBC: 0 % (ref 0.0–0.2)

## 2018-10-27 LAB — CMP (CANCER CENTER ONLY)
ALT: 61 U/L — ABNORMAL HIGH (ref 0–44)
AST: 31 U/L (ref 15–41)
Albumin: 4.4 g/dL (ref 3.5–5.0)
Alkaline Phosphatase: 56 U/L (ref 38–126)
Anion gap: 7 (ref 5–15)
BUN: 12 mg/dL (ref 6–20)
CO2: 27 mmol/L (ref 22–32)
Calcium: 8.7 mg/dL — ABNORMAL LOW (ref 8.9–10.3)
Chloride: 105 mmol/L (ref 98–111)
Creatinine: 1.21 mg/dL (ref 0.61–1.24)
GFR, Est AFR Am: >60 mL/min (ref >60)
GFR, Estimated: >60 mL/min (ref >60)
Glucose, Bld: 83 mg/dL (ref 70–99)
Potassium: 4.1 mmol/L (ref 3.5–5.1)
Sodium: 139 mmol/L (ref 135–145)
Total Bilirubin: 0.9 mg/dL (ref 0.3–1.2)
Total Protein: 7.1 g/dL (ref 6.5–8.1)

## 2018-10-27 MED ORDER — BORTEZOMIB CHEMO SQ INJECTION 3.5 MG (2.5MG/ML)
3.0000 mg | Freq: Once | INTRAMUSCULAR | Status: AC
  Administered 2018-10-27: 3 mg via SUBCUTANEOUS
  Filled 2018-10-27: qty 1.2

## 2018-10-27 NOTE — Patient Instructions (Signed)
[facility] Discharge Instructions for Patients Receiving Chemotherapy  Today you received the following chemotherapy agents Velcade To help prevent nausea and vomiting after your treatment, we encourage you to take your nausea medication as prescribed.   If you develop nausea and vomiting that is not controlled by your nausea medication, call the clinic.   BELOW ARE SYMPTOMS THAT SHOULD BE REPORTED IMMEDIATELY:  *FEVER GREATER THAN 100.5 F  *CHILLS WITH OR WITHOUT FEVER  NAUSEA AND VOMITING THAT IS NOT CONTROLLED WITH YOUR NAUSEA MEDICATION  *UNUSUAL SHORTNESS OF BREATH  *UNUSUAL BRUISING OR BLEEDING  TENDERNESS IN MOUTH AND THROAT WITH OR WITHOUT PRESENCE OF ULCERS  *URINARY PROBLEMS  *BOWEL PROBLEMS  UNUSUAL RASH Items with * indicate a potential emergency and should be followed up as soon as possible.  Feel free to call the clinic should you have any questions or concerns. The clinic phone number is [phone].  Please show the CHEMO ALERT CARD at check-in to the Emergency Department and triage nurse.   

## 2018-10-27 NOTE — Progress Notes (Signed)
11:50 AM OK to treat with WBC 2.7 per Dr. [name].

## 2018-11-10 ENCOUNTER — Encounter: Payer: Self-pay | Admitting: Hematology & Oncology

## 2018-11-10 ENCOUNTER — Inpatient Hospital Stay: Payer: BC Managed Care – PPO

## 2018-11-10 ENCOUNTER — Other Ambulatory Visit: Payer: Self-pay

## 2018-11-10 ENCOUNTER — Inpatient Hospital Stay (HOSPITAL_BASED_OUTPATIENT_CLINIC_OR_DEPARTMENT_OTHER): Payer: BC Managed Care – PPO | Admitting: Hematology & Oncology

## 2018-11-10 VITALS — BP 136/82 | HR 54 | Temp 97.1°F | Resp 20 | Wt 216.0 lb

## 2018-11-10 DIAGNOSIS — C9000 Multiple myeloma not having achieved remission: Secondary | ICD-10-CM | POA: Diagnosis not present

## 2018-11-10 DIAGNOSIS — Z9484 Stem cells transplant status: Secondary | ICD-10-CM

## 2018-11-10 DIAGNOSIS — C9001 Multiple myeloma in remission: Secondary | ICD-10-CM

## 2018-11-10 DIAGNOSIS — Z5112 Encounter for antineoplastic immunotherapy: Secondary | ICD-10-CM | POA: Diagnosis not present

## 2018-11-10 DIAGNOSIS — Z79899 Other long term (current) drug therapy: Secondary | ICD-10-CM

## 2018-11-10 LAB — CMP (CANCER CENTER ONLY)
ALT: 55 U/L — ABNORMAL HIGH (ref 0–44)
AST: 28 U/L (ref 15–41)
Albumin: 4.3 g/dL (ref 3.5–5.0)
Alkaline Phosphatase: 50 U/L (ref 38–126)
Anion gap: 6 (ref 5–15)
BUN: 13 mg/dL (ref 6–20)
CO2: 26 mmol/L (ref 22–32)
Calcium: 8.2 mg/dL — ABNORMAL LOW (ref 8.9–10.3)
Chloride: 106 mmol/L (ref 98–111)
Creatinine: 1.19 mg/dL (ref 0.61–1.24)
GFR, Est AFR Am: >60 mL/min (ref >60)
GFR, Estimated: >60 mL/min (ref >60)
Glucose, Bld: 62 mg/dL — ABNORMAL LOW (ref 70–99)
Potassium: 4.6 mmol/L (ref 3.5–5.1)
Sodium: 138 mmol/L (ref 135–145)
Total Bilirubin: 0.6 mg/dL (ref 0.3–1.2)
Total Protein: 6.8 g/dL (ref 6.5–8.1)

## 2018-11-10 LAB — CBC WITH DIFFERENTIAL (CANCER CENTER ONLY)
Abs Immature Granulocytes: 0 10*3/uL (ref 0.00–0.07)
Basophils Absolute: 0 10*3/uL (ref 0.0–0.1)
Basophils Relative: 1 %
Eosinophils Absolute: 0.1 10*3/uL (ref 0.0–0.5)
Eosinophils Relative: 5 %
HCT: 38.9 % — ABNORMAL LOW (ref 39.0–52.0)
Hemoglobin: 13.7 g/dL (ref 13.0–17.0)
Immature Granulocytes: 0 %
Lymphocytes Relative: 33 %
Lymphs Abs: 0.9 10*3/uL (ref 0.7–4.0)
MCH: 36.3 pg — ABNORMAL HIGH (ref 26.0–34.0)
MCHC: 35.2 g/dL (ref 30.0–36.0)
MCV: 103.2 fL — ABNORMAL HIGH (ref 80.0–100.0)
Monocytes Absolute: 0.3 10*3/uL (ref 0.1–1.0)
Monocytes Relative: 9 %
Neutro Abs: 1.5 10*3/uL — ABNORMAL LOW (ref 1.7–7.7)
Neutrophils Relative %: 52 %
Platelet Count: 145 10*3/uL — ABNORMAL LOW (ref 150–400)
RBC: 3.77 MIL/uL — ABNORMAL LOW (ref 4.22–5.81)
RDW: 13 % (ref 11.5–15.5)
WBC Count: 2.9 10*3/uL — ABNORMAL LOW (ref 4.0–10.5)
nRBC: 0 % (ref 0.0–0.2)

## 2018-11-10 MED ORDER — BORTEZOMIB CHEMO SQ INJECTION 3.5 MG (2.5MG/ML)
3.0000 mg | Freq: Once | INTRAMUSCULAR | Status: AC
  Administered 2018-11-10: 3 mg via SUBCUTANEOUS
  Filled 2018-11-10: qty 1.2

## 2018-11-10 NOTE — Patient Instructions (Signed)
Bortezomib injection What is this medicine? BORTEZOMIB (bor TEZ oh mib) is a medicine that targets proteins in cancer cells and stops the cancer cells from growing. It is used to treat multiple myeloma and mantle-cell lymphoma. This medicine may be used for other purposes; ask your health care provider or pharmacist if you have questions. COMMON BRAND NAME(S): Velcade What should I tell my health care provider before I take this medicine? They need to know if you have any of these conditions:  diabetes  heart disease  irregular heartbeat  liver disease  on hemodialysis  low blood counts, like low white blood cells, platelets, or hemoglobin  peripheral neuropathy  taking medicine for blood pressure  an unusual or allergic reaction to bortezomib, mannitol, boron, other medicines, foods, dyes, or preservatives  pregnant or trying to get pregnant  breast-feeding How should I use this medicine? This medicine is for injection into a vein or for injection under the skin. It is given by a health care professional in a hospital or clinic setting. Talk to your pediatrician regarding the use of this medicine in children. Special care may be needed. Overdosage: If you think you have taken too much of this medicine contact a poison control center or emergency room at once. NOTE: This medicine is only for you. Do not share this medicine with others. What if I miss a dose? It is important not to miss your dose. Call your doctor or health care professional if you are unable to keep an appointment. What may interact with this medicine? This medicine may interact with the following medications:  ketoconazole  rifampin  ritonavir  St. John's Wort This list may not describe all possible interactions. Give your health care provider a list of all the medicines, herbs, non-prescription drugs, or dietary supplements you use. Also tell them if you smoke, drink alcohol, or use illegal drugs. Some  items may interact with your medicine. What should I watch for while using this medicine? You may get drowsy or dizzy. Do not drive, use machinery, or do anything that needs mental alertness until you know how this medicine affects you. Do not stand or sit up quickly, especially if you are an older patient. This reduces the risk of dizzy or fainting spells. In some cases, you may be given additional medicines to help with side effects. Follow all directions for their use. Call your doctor or health care professional for advice if you get a fever, chills or sore throat, or other symptoms of a cold or flu. Do not treat yourself. This drug decreases your body's ability to fight infections. Try to avoid being around people who are sick. This medicine may increase your risk to bruise or bleed. Call your doctor or health care professional if you notice any unusual bleeding. You may need blood work done while you are taking this medicine. In some patients, this medicine may cause a serious brain infection that may cause death. If you have any problems seeing, thinking, speaking, walking, or standing, tell your doctor right away. If you cannot reach your doctor, urgently seek other source of medical care. Check with your doctor or health care professional if you get an attack of severe diarrhea, nausea and vomiting, or if you sweat a lot. The loss of too much body fluid can make it dangerous for you to take this medicine. Do not become pregnant while taking this medicine or for at least 7 months after stopping it. Women should inform their doctor   if they wish to become pregnant or think they might be pregnant. Men should not father a child while taking this medicine and for at least 4 months after stopping it. There is a potential for serious side effects to an unborn child. Talk to your health care professional or pharmacist for more information. Do not breast-feed an infant while taking this medicine or for 2  months after stopping it. This medicine may interfere with the ability to have a child. You should talk with your doctor or health care professional if you are concerned about your fertility. What side effects may I notice from receiving this medicine? Side effects that you should report to your doctor or health care professional as soon as possible:  allergic reactions like skin rash, itching or hives, swelling of the face, lips, or tongue  breathing problems  changes in hearing  changes in vision  fast, irregular heartbeat  feeling faint or lightheaded, falls  pain, tingling, numbness in the hands or feet  right upper belly pain  seizures  swelling of the ankles, feet, hands  unusual bleeding or bruising  unusually weak or tired  vomiting  yellowing of the eyes or skin Side effects that usually do not require medical attention (report to your doctor or health care professional if they continue or are bothersome):  changes in emotions or moods  constipation  diarrhea  loss of appetite  headache  irritation at site where injected  nausea This list may not describe all possible side effects. Call your doctor for medical advice about side effects. You may report side effects to FDA at 1-800-FDA-1088. Where should I keep my medicine? This drug is given in a hospital or clinic and will not be stored at home. NOTE: This sheet is a summary. It may not cover all possible information. If you have questions about this medicine, talk to your doctor, pharmacist, or health care provider.  2020 Elsevier/Gold Standard (2017-08-15 16:29:31)  

## 2018-11-10 NOTE — Progress Notes (Signed)
Hematology and Oncology Follow Up Visit  Tyler Phillips [id number] 12/08/1965 53 y.o. 11/10/2018   Principle Diagnosis:  IgG Kappa myeloma - +4, +14, +17  Current Therapy:   Status post autologous stem cell transplant on 05/22/2015  S/p cycle 6 of RVD  Velcade q 2wk dosing/Revlimid 10mg  po q day (21/7)    Interim History:  [name] is here today for follow-up.  He is doing quite well.  He has little week before he finally retired.  Was retired, he is family will be going to [place] for a couple weeks.  Then, in September, they might go out to [place] for a month.  I think this would be a lot of fun.  He also was getting some surfing.  I am sure he will have a good time doing this.  He is done very well with the Velcade and Revlimid.  He has had no issues so far.  He has had no problems with rashes.  He has had no diarrhea.  He has had no cough.  There is been no bleeding.  He has had no leg swelling.  His last monoclonal spike was not detected in his blood.  His IgG level 1250 mg/dL.  His Kappa light chain is 4.3 mg /dL.  Overall, his performance status is ECOG 0.    Medications:  Allergies as of 11/10/2018   No Known Allergies     Medication List       Accurate as of November 10, 2018  9:26 AM. If you have any questions, ask your nurse or doctor.        aspirin 325 MG tablet Take 325 mg by mouth daily.   bortezomib IV 3.5 MG injection Commonly known as: VELCADE Inject as directed every 14 (fourteen) days.   cetirizine 10 MG tablet Commonly known as: ZYRTEC Take 10 mg by mouth daily.   clindamycin 300 MG capsule Commonly known as: CLEOCIN Take 600 mg by mouth See admin instructions. 1 hour prior to dental appts   lenalidomide 10 MG capsule Commonly known as: REVLIMID Take 1 capsule by mouth daily for 21 days on and 7 days off. Auth. #[id number]   LORazepam 1 MG tablet Commonly known as: ATIVAN Take 1 mg by mouth every 4 (four) hours as needed.   oxyCODONE 5  MG immediate release tablet Commonly known as: Oxy IR/ROXICODONE Take 5 mg by mouth every 4 (four) hours as needed.   penicillin v potassium 500 MG tablet Commonly known as: VEETID TAKE ONE TABLET (500 MG TOTAL) BY MOUTH EVERY 6 HOURS FOR 7 DAYS.   prochlorperazine 10 MG tablet Commonly known as: COMPAZINE Take 10 mg by mouth every 6 (six) hours as needed.   valACYclovir 500 MG tablet Commonly known as: VALTREX Take 500 mg by mouth 2 (two) times daily.   Vitamin D3 25 MCG (1000 UT) Caps Take by mouth.       Allergies: No Known Allergies  Past Medical History, Surgical history, Social history, and Family History were reviewed and updated.  Review of Systems: Review of Systems  Constitutional: Negative.   HENT: Negative.   Eyes: Negative.   Respiratory: Negative.   Cardiovascular: Negative.   Gastrointestinal: Negative.   Genitourinary: Negative.   Musculoskeletal: Negative.   Skin: Negative.   Neurological: Negative.   Endo/Heme/Allergies: Negative.   Psychiatric/Behavioral: Negative.      Physical Exam:  weight is 216 lb (98 kg). His oral temperature is 97.1 F (36.2 C) (abnormal). His  blood pressure is 136/82 and his pulse is 54 (abnormal). His respiration is 20 and oxygen saturation is 97%.   Wt Readings from Last 3 Encounters:  11/10/18 216 lb (98 kg)  09/29/18 218 lb (98.9 kg)  08/18/18 222 lb 12.8 oz (101.1 kg)    Physical Exam Vitals signs reviewed.  HENT:     Head: Normocephalic and atraumatic.  Eyes:     Pupils: Pupils are equal, round, and reactive to light.  Neck:     Musculoskeletal: Normal range of motion.  Cardiovascular:     Rate and Rhythm: Normal rate and regular rhythm.     Heart sounds: Normal heart sounds.  Pulmonary:     Effort: Pulmonary effort is normal.     Breath sounds: Normal breath sounds.  Abdominal:     General: Bowel sounds are normal.     Palpations: Abdomen is soft.  Musculoskeletal: Normal range of motion.         General: No tenderness or deformity.  Lymphadenopathy:     Cervical: No cervical adenopathy.  Skin:    General: Skin is warm and dry.     Findings: No erythema or rash.  Neurological:     Mental Status: He is alert and oriented to person, place, and time.  Psychiatric:        Behavior: Behavior normal.        Thought Content: Thought content normal.        Judgment: Judgment normal.      Lab Results  Component Value Date   WBC 2.9 (L) 11/10/2018   HGB 13.7 11/10/2018   HCT 38.9 (L) 11/10/2018   MCV 103.2 (H) 11/10/2018   PLT 145 (L) 11/10/2018   Lab Results  Component Value Date   FERRITIN 1,263 (H) 07/20/2014   IRON 125 07/20/2014   TIBC 198 (L) 07/20/2014   UIBC 73 (L) 07/20/2014   IRONPCTSAT 63 (H) 07/20/2014   Lab Results  Component Value Date   RETICCTPCT 0.8 07/20/2014   RBC 3.77 (L) 11/10/2018   Lab Results  Component Value Date   KPAFRELGTCHN 42.6 (H) 09/29/2018   LAMBDASER 21.4 09/29/2018   KAPLAMBRATIO 1.99 (H) 09/29/2018   Lab Results  Component Value Date   IGGSERUM 1,251 09/29/2018   IGA 216 09/29/2018   IGMSERUM 18 (L) 09/29/2018   Lab Results  Component Value Date   TOTALPROTELP 6.6 09/29/2018   ALBUMINELP 3.7 09/29/2018   A1GS 0.2 09/29/2018   A2GS 0.5 09/29/2018   BETS 0.9 09/29/2018   BETA2SER 0.3 03/28/2015   GAMS 1.2 09/29/2018   MSPIKE Not Observed 09/29/2018   SPEI Comment 08/18/2018     Chemistry      Component Value Date/Time   NA 139 10/27/2018 1122   NA 140 04/22/2017 1140   NA 138 09/03/2015 1042   K 4.1 10/27/2018 1122   K 4.0 04/22/2017 1140   K 4.3 09/03/2015 1042   CL 105 10/27/2018 1122   CL 105 04/22/2017 1140   CO2 27 10/27/2018 1122   CO2 24 04/22/2017 1140   CO2 25 09/03/2015 1042   BUN 12 10/27/2018 1122   BUN 16 04/22/2017 1140   BUN 21.7 09/03/2015 1042   CREATININE 1.21 10/27/2018 1122   CREATININE 1.6 (H) 04/22/2017 1140   CREATININE 1.2 09/03/2015 1042      Component Value Date/Time   CALCIUM  8.7 (L) 10/27/2018 1122   CALCIUM 8.5 04/22/2017 1140   CALCIUM 9.1 09/03/2015 1042   ALKPHOS  56 10/27/2018 1122   ALKPHOS 59 04/22/2017 1140   ALKPHOS 42 09/03/2015 1042   AST 31 10/27/2018 1122   AST 27 09/03/2015 1042   ALT 61 (H) 10/27/2018 1122   ALT 45 04/22/2017 1140   ALT 41 09/03/2015 1042   BILITOT 0.9 10/27/2018 1122   BILITOT 0.54 09/03/2015 1042      Impression and Plan: [name] is a pleasant 53 yo caucasian gentleman with IgG kappa myeloma.  He underwent induction chemotherapy with RVD.  He then underwent an autologous stem cell transplant at [facility] on February 2017.  I am very pleased that he is done so well.  He is now over 3 years out from his transplant.  His myeloma levels are not found in his blood.  I will worried about the light chain going up a little bit.  We have to monitor this.  We will have to see what the level is at this time.  We will get him back in a month to see [place].  Hopefully, we will be able to adjust his schedule so that he will be able to go out to [place] for a month.  [name], MD 7/24/20209:26 AM

## 2018-11-11 LAB — IGG, IGA, IGM
IgA: 239 mg/dL (ref 90–386)
IgG (Immunoglobin G), Serum: 1359 mg/dL (ref 603–1613)
IgM (Immunoglobulin M), Srm: 20 mg/dL (ref 20–172)

## 2018-11-13 LAB — PROTEIN ELECTROPHORESIS, SERUM, WITH REFLEX
A/G Ratio: 1.5 (ref 0.7–1.7)
Albumin ELP: 4.3 g/dL (ref 2.9–4.4)
Alpha-1-Globulin: 0.2 g/dL (ref 0.0–0.4)
Alpha-2-Globulin: 0.6 g/dL (ref 0.4–1.0)
Beta Globulin: 0.8 g/dL (ref 0.7–1.3)
Gamma Globulin: 1.2 g/dL (ref 0.4–1.8)
Globulin, Total: 2.8 g/dL (ref 2.2–3.9)
Total Protein ELP: 7.1 g/dL (ref 6.0–8.5)

## 2018-11-13 LAB — KAPPA/LAMBDA LIGHT CHAINS
Kappa free light chain: 44 mg/L — ABNORMAL HIGH (ref 3.3–19.4)
Kappa, lambda light chain ratio: 1.6 (ref 0.26–1.65)
Lambda free light chains: 27.5 mg/L — ABNORMAL HIGH (ref 5.7–26.3)

## 2018-11-24 ENCOUNTER — Other Ambulatory Visit: Payer: BC Managed Care – PPO

## 2018-11-24 ENCOUNTER — Ambulatory Visit: Payer: BC Managed Care – PPO

## 2018-11-29 ENCOUNTER — Other Ambulatory Visit: Payer: Self-pay | Admitting: Hematology & Oncology

## 2018-11-29 DIAGNOSIS — C9002 Multiple myeloma in relapse: Secondary | ICD-10-CM

## 2018-11-30 ENCOUNTER — Other Ambulatory Visit: Payer: Self-pay | Admitting: Hematology & Oncology

## 2018-11-30 DIAGNOSIS — C9002 Multiple myeloma in relapse: Secondary | ICD-10-CM

## 2018-12-05 ENCOUNTER — Other Ambulatory Visit: Payer: Self-pay | Admitting: *Deleted

## 2018-12-05 DIAGNOSIS — C9002 Multiple myeloma in relapse: Secondary | ICD-10-CM

## 2018-12-05 MED ORDER — LENALIDOMIDE 10 MG PO CAPS: ORAL_CAPSULE | ORAL | 0 refills | Status: DC

## 2018-12-08 ENCOUNTER — Inpatient Hospital Stay: Payer: BC Managed Care – PPO | Attending: Hematology & Oncology

## 2018-12-08 ENCOUNTER — Inpatient Hospital Stay: Payer: BC Managed Care – PPO

## 2018-12-08 ENCOUNTER — Inpatient Hospital Stay (HOSPITAL_BASED_OUTPATIENT_CLINIC_OR_DEPARTMENT_OTHER): Payer: BC Managed Care – PPO | Admitting: Hematology & Oncology

## 2018-12-08 ENCOUNTER — Other Ambulatory Visit: Payer: Self-pay

## 2018-12-08 ENCOUNTER — Encounter: Payer: Self-pay | Admitting: Hematology & Oncology

## 2018-12-08 VITALS — BP 126/85 | HR 45 | Temp 97.6°F | Resp 18 | Ht 73.0 in | Wt 219.8 lb

## 2018-12-08 DIAGNOSIS — C9000 Multiple myeloma not having achieved remission: Secondary | ICD-10-CM | POA: Insufficient documentation

## 2018-12-08 DIAGNOSIS — C9001 Multiple myeloma in remission: Secondary | ICD-10-CM

## 2018-12-08 DIAGNOSIS — Z5112 Encounter for antineoplastic immunotherapy: Secondary | ICD-10-CM | POA: Diagnosis not present

## 2018-12-08 LAB — CBC WITH DIFFERENTIAL (CANCER CENTER ONLY)
Abs Immature Granulocytes: 0 10*3/uL (ref 0.00–0.07)
Basophils Absolute: 0 10*3/uL (ref 0.0–0.1)
Basophils Relative: 0 %
Eosinophils Absolute: 0.1 10*3/uL (ref 0.0–0.5)
Eosinophils Relative: 5 %
HCT: 35.5 % — ABNORMAL LOW (ref 39.0–52.0)
Hemoglobin: 12.6 g/dL — ABNORMAL LOW (ref 13.0–17.0)
Immature Granulocytes: 0 %
Lymphocytes Relative: 31 %
Lymphs Abs: 0.7 10*3/uL (ref 0.7–4.0)
MCH: 37 pg — ABNORMAL HIGH (ref 26.0–34.0)
MCHC: 35.5 g/dL (ref 30.0–36.0)
MCV: 104.1 fL — ABNORMAL HIGH (ref 80.0–100.0)
Monocytes Absolute: 0.3 10*3/uL (ref 0.1–1.0)
Monocytes Relative: 14 %
Neutro Abs: 1.1 10*3/uL — ABNORMAL LOW (ref 1.7–7.7)
Neutrophils Relative %: 50 %
Platelet Count: 128 10*3/uL — ABNORMAL LOW (ref 150–400)
RBC: 3.41 MIL/uL — ABNORMAL LOW (ref 4.22–5.81)
RDW: 13.2 % (ref 11.5–15.5)
WBC Count: 2.3 10*3/uL — ABNORMAL LOW (ref 4.0–10.5)
nRBC: 0 % (ref 0.0–0.2)

## 2018-12-08 LAB — CMP (CANCER CENTER ONLY)
ALT: 48 U/L — ABNORMAL HIGH (ref 0–44)
AST: 30 U/L (ref 15–41)
Albumin: 4 g/dL (ref 3.5–5.0)
Alkaline Phosphatase: 53 U/L (ref 38–126)
Anion gap: 7 (ref 5–15)
BUN: 16 mg/dL (ref 6–20)
CO2: 26 mmol/L (ref 22–32)
Calcium: 8.4 mg/dL — ABNORMAL LOW (ref 8.9–10.3)
Chloride: 105 mmol/L (ref 98–111)
Creatinine: 1.28 mg/dL — ABNORMAL HIGH (ref 0.61–1.24)
GFR, Est AFR Am: >60 mL/min (ref >60)
GFR, Estimated: >60 mL/min (ref >60)
Glucose, Bld: 94 mg/dL (ref 70–99)
Potassium: 3.9 mmol/L (ref 3.5–5.1)
Sodium: 138 mmol/L (ref 135–145)
Total Bilirubin: 0.7 mg/dL (ref 0.3–1.2)
Total Protein: 6.9 g/dL (ref 6.5–8.1)

## 2018-12-08 MED ORDER — BORTEZOMIB CHEMO SQ INJECTION 3.5 MG (2.5MG/ML)
3.0000 mg | Freq: Once | INTRAMUSCULAR | Status: AC
  Administered 2018-12-08: 3 mg via SUBCUTANEOUS
  Filled 2018-12-08: qty 1.2

## 2018-12-08 NOTE — Progress Notes (Signed)
Ok to treat with ANC of 1.1. dph

## 2018-12-08 NOTE — Patient Instructions (Signed)
Bortezomib injection What is this medicine? BORTEZOMIB (bor TEZ oh mib) is a medicine that targets proteins in cancer cells and stops the cancer cells from growing. It is used to treat multiple myeloma and mantle-cell lymphoma. This medicine may be used for other purposes; ask your health care provider or pharmacist if you have questions. COMMON BRAND NAME(S): Velcade What should I tell my health care provider before I take this medicine? They need to know if you have any of these conditions:  diabetes  heart disease  irregular heartbeat  liver disease  on hemodialysis  low blood counts, like low white blood cells, platelets, or hemoglobin  peripheral neuropathy  taking medicine for blood pressure  an unusual or allergic reaction to bortezomib, mannitol, boron, other medicines, foods, dyes, or preservatives  pregnant or trying to get pregnant  breast-feeding How should I use this medicine? This medicine is for injection into a vein or for injection under the skin. It is given by a health care professional in a hospital or clinic setting. Talk to your pediatrician regarding the use of this medicine in children. Special care may be needed. Overdosage: If you think you have taken too much of this medicine contact a poison control center or emergency room at once. NOTE: This medicine is only for you. Do not share this medicine with others. What if I miss a dose? It is important not to miss your dose. Call your doctor or health care professional if you are unable to keep an appointment. What may interact with this medicine? This medicine may interact with the following medications:  ketoconazole  rifampin  ritonavir  St. John's Wort This list may not describe all possible interactions. Give your health care provider a list of all the medicines, herbs, non-prescription drugs, or dietary supplements you use. Also tell them if you smoke, drink alcohol, or use illegal drugs. Some  items may interact with your medicine. What should I watch for while using this medicine? You may get drowsy or dizzy. Do not drive, use machinery, or do anything that needs mental alertness until you know how this medicine affects you. Do not stand or sit up quickly, especially if you are an older patient. This reduces the risk of dizzy or fainting spells. In some cases, you may be given additional medicines to help with side effects. Follow all directions for their use. Call your doctor or health care professional for advice if you get a fever, chills or sore throat, or other symptoms of a cold or flu. Do not treat yourself. This drug decreases your body's ability to fight infections. Try to avoid being around people who are sick. This medicine may increase your risk to bruise or bleed. Call your doctor or health care professional if you notice any unusual bleeding. You may need blood work done while you are taking this medicine. In some patients, this medicine may cause a serious brain infection that may cause death. If you have any problems seeing, thinking, speaking, walking, or standing, tell your doctor right away. If you cannot reach your doctor, urgently seek other source of medical care. Check with your doctor or health care professional if you get an attack of severe diarrhea, nausea and vomiting, or if you sweat a lot. The loss of too much body fluid can make it dangerous for you to take this medicine. Do not become pregnant while taking this medicine or for at least 7 months after stopping it. Women should inform their doctor   if they wish to become pregnant or think they might be pregnant. Men should not father a child while taking this medicine and for at least 4 months after stopping it. There is a potential for serious side effects to an unborn child. Talk to your health care professional or pharmacist for more information. Do not breast-feed an infant while taking this medicine or for 2  months after stopping it. This medicine may interfere with the ability to have a child. You should talk with your doctor or health care professional if you are concerned about your fertility. What side effects may I notice from receiving this medicine? Side effects that you should report to your doctor or health care professional as soon as possible:  allergic reactions like skin rash, itching or hives, swelling of the face, lips, or tongue  breathing problems  changes in hearing  changes in vision  fast, irregular heartbeat  feeling faint or lightheaded, falls  pain, tingling, numbness in the hands or feet  right upper belly pain  seizures  swelling of the ankles, feet, hands  unusual bleeding or bruising  unusually weak or tired  vomiting  yellowing of the eyes or skin Side effects that usually do not require medical attention (report to your doctor or health care professional if they continue or are bothersome):  changes in emotions or moods  constipation  diarrhea  loss of appetite  headache  irritation at site where injected  nausea This list may not describe all possible side effects. Call your doctor for medical advice about side effects. You may report side effects to FDA at 1-800-FDA-1088. Where should I keep my medicine? This drug is given in a hospital or clinic and will not be stored at home. NOTE: This sheet is a summary. It may not cover all possible information. If you have questions about this medicine, talk to your doctor, pharmacist, or health care provider.  2020 Elsevier/Gold Standard (2017-08-15 16:29:31)  

## 2018-12-08 NOTE — Progress Notes (Signed)
Hematology and Oncology Follow Up Visit  Tyler Phillips [id number] 05-01-1965 53 y.o. 12/08/2018   Principle Diagnosis:  IgG Kappa myeloma - +4, +14, +17  Current Therapy:   Status post autologous stem cell transplant on 05/22/2015  S/p cycle 6 of RVD  Velcade q 2wk dosing/Revlimid 10mg  po q day (21/7)    Interim History:  Tyler Phillips is here today for follow-up.  He is now retired.  Today after he retired, he went to [place].  He went surfing.  He really had a good time.  He now is planning on going to [place] for a month or so.  He will be with his family.  They will be in the mountains.  He really thinks this is really a very nice vacation.  The only issue with him being gone for about 6 weeks is that he will not be able to have his Velcade treatment.  As such, I will think about having him take Ninlaro.  I think this would be a good option for him while he is out of town.  His last myeloma studies did not show a monoclonal spike.  However, his light chain level was trending upward.  His kappa light chain back in late July was 4.4 mg/dL.  In June it was 4.3 mg/dL.  He has had no problems with fatigue.  He has had no change in bowel or bladder habits.  He does have some loose stools with the Revlimid.  He has had no rashes.  He has had no leg swelling.  He has had no fever.  Overall, his performance status is ECOG 0.    Medications:  Allergies as of 12/08/2018   No Known Allergies     Medication List       Accurate as of December 08, 2018  9:44 AM. If you have any questions, ask your nurse or doctor.        aspirin 325 MG tablet Take 325 mg by mouth daily.   bortezomib IV 3.5 MG injection Commonly known as: VELCADE Inject as directed every 14 (fourteen) days.   cetirizine 10 MG tablet Commonly known as: ZYRTEC Take 10 mg by mouth daily.   clindamycin 300 MG capsule Commonly known as: CLEOCIN Take 600 mg by mouth See admin instructions. 1 hour prior to dental  appts   lenalidomide 10 MG capsule Commonly known as: Revlimid TAKE 1 CAPSULE DAILY FOR 21 DAYS ON AND 7 DAYS [id number]   LORazepam 1 MG tablet Commonly known as: ATIVAN Take 1 mg by mouth every 4 (four) hours as needed.   oxyCODONE 5 MG immediate release tablet Commonly known as: Oxy IR/ROXICODONE Take 5 mg by mouth every 4 (four) hours as needed.   penicillin v potassium 500 MG tablet Commonly known as: VEETID TAKE ONE TABLET (500 MG TOTAL) BY MOUTH EVERY 6 HOURS FOR 7 DAYS.   prochlorperazine 10 MG tablet Commonly known as: COMPAZINE Take 10 mg by mouth every 6 (six) hours as needed.   valACYclovir 500 MG tablet Commonly known as: VALTREX Take 500 mg by mouth 2 (two) times daily.   Vitamin D3 25 MCG (1000 UT) Caps Take by mouth.       Allergies: No Known Allergies  Past Medical History, Surgical history, Social history, and Family History were reviewed and updated.  Review of Systems: Review of Systems  Constitutional: Negative.   HENT: Negative.   Eyes: Negative.   Respiratory: Negative.   Cardiovascular: Negative.   Gastrointestinal:  Negative.   Genitourinary: Negative.   Musculoskeletal: Negative.   Skin: Negative.   Neurological: Negative.   Endo/Heme/Allergies: Negative.   Psychiatric/Behavioral: Negative.      Physical Exam:  height is 6\' 1"  (1.854 m) and weight is 219 lb 12.8 oz (99.7 kg). His temporal temperature is 97.6 F (36.4 C). His blood pressure is 126/85 and his pulse is 45 (abnormal). His respiration is 18 and oxygen saturation is 100%.   Wt Readings from Last 3 Encounters:  12/08/18 219 lb 12.8 oz (99.7 kg)  11/10/18 216 lb (98 kg)  09/29/18 218 lb (98.9 kg)    Physical Exam Vitals signs reviewed.  HENT:     Head: Normocephalic and atraumatic.  Eyes:     Pupils: Pupils are equal, round, and reactive to light.  Neck:     Musculoskeletal: Normal range of motion.  Cardiovascular:     Rate and Rhythm: Normal rate and regular  rhythm.     Heart sounds: Normal heart sounds.  Pulmonary:     Effort: Pulmonary effort is normal.     Breath sounds: Normal breath sounds.  Abdominal:     General: Bowel sounds are normal.     Palpations: Abdomen is soft.  Musculoskeletal: Normal range of motion.        General: No tenderness or deformity.  Lymphadenopathy:     Cervical: No cervical adenopathy.  Skin:    General: Skin is warm and dry.     Findings: No erythema or rash.  Neurological:     Mental Status: He is alert and oriented to person, place, and time.  Psychiatric:        Behavior: Behavior normal.        Thought Content: Thought content normal.        Judgment: Judgment normal.      Lab Results  Component Value Date   WBC 2.3 (L) 12/08/2018   HGB 12.6 (L) 12/08/2018   HCT 35.5 (L) 12/08/2018   MCV 104.1 (H) 12/08/2018   PLT 128 (L) 12/08/2018   Lab Results  Component Value Date   FERRITIN 1,263 (H) 07/20/2014   IRON 125 07/20/2014   TIBC 198 (L) 07/20/2014   UIBC 73 (L) 07/20/2014   IRONPCTSAT 63 (H) 07/20/2014   Lab Results  Component Value Date   RETICCTPCT 0.8 07/20/2014   RBC 3.41 (L) 12/08/2018   Lab Results  Component Value Date   KPAFRELGTCHN 44.0 (H) 11/10/2018   LAMBDASER 27.5 (H) 11/10/2018   KAPLAMBRATIO 1.60 11/10/2018   Lab Results  Component Value Date   IGGSERUM 1,359 11/10/2018   IGA 239 11/10/2018   IGMSERUM 20 11/10/2018   Lab Results  Component Value Date   TOTALPROTELP 7.1 11/10/2018   ALBUMINELP 4.3 11/10/2018   A1GS 0.2 11/10/2018   A2GS 0.6 11/10/2018   BETS 0.8 11/10/2018   BETA2SER 0.3 03/28/2015   GAMS 1.2 11/10/2018   MSPIKE Not Observed 11/10/2018   SPEI Comment 08/18/2018     Chemistry      Component Value Date/Time   NA 138 12/08/2018 0857   NA 140 04/22/2017 1140   NA 138 09/03/2015 1042   K 3.9 12/08/2018 0857   K 4.0 04/22/2017 1140   K 4.3 09/03/2015 1042   CL 105 12/08/2018 0857   CL 105 04/22/2017 1140   CO2 26 12/08/2018 0857    CO2 24 04/22/2017 1140   CO2 25 09/03/2015 1042   BUN 16 12/08/2018 0857   BUN 16  04/22/2017 1140   BUN 21.7 09/03/2015 1042   CREATININE 1.28 (H) 12/08/2018 0857   CREATININE 1.6 (H) 04/22/2017 1140   CREATININE 1.2 09/03/2015 1042      Component Value Date/Time   CALCIUM 8.4 (L) 12/08/2018 0857   CALCIUM 8.5 04/22/2017 1140   CALCIUM 9.1 09/03/2015 1042   ALKPHOS 53 12/08/2018 0857   ALKPHOS 59 04/22/2017 1140   ALKPHOS 42 09/03/2015 1042   AST 30 12/08/2018 0857   AST 27 09/03/2015 1042   ALT 48 (H) 12/08/2018 0857   ALT 45 04/22/2017 1140   ALT 41 09/03/2015 1042   BILITOT 0.7 12/08/2018 0857   BILITOT 0.54 09/03/2015 1042      Impression and Plan: Tyler Phillips is a pleasant 53 yo caucasian gentleman with IgG kappa myeloma.  He underwent induction chemotherapy with RVD.  He then underwent an autologous stem cell transplant at [facility] on February 2017.  I am very pleased that he is done so well.  He is now over 3 years out from his transplant.  His myeloma levels are not found in his blood.  I will worried about the light chain going up a little bit.  We have to monitor this.  We will have to see what the level is at this time.  We will get him back in 2 weeks to see [place].  I want to make sure we get him back before he goes to [place].   [name], MD 8/21/20209:44 AM

## 2018-12-09 LAB — IGG, IGA, IGM
IgA: 218 mg/dL (ref 90–386)
IgG (Immunoglobin G), Serum: 1186 mg/dL (ref 603–1613)
IgM (Immunoglobulin M), Srm: 19 mg/dL — ABNORMAL LOW (ref 20–172)

## 2018-12-11 LAB — PROTEIN ELECTROPHORESIS, SERUM, WITH REFLEX
A/G Ratio: 1.3 (ref 0.7–1.7)
Albumin ELP: 3.7 g/dL (ref 2.9–4.4)
Alpha-1-Globulin: 0.2 g/dL (ref 0.0–0.4)
Alpha-2-Globulin: 0.6 g/dL (ref 0.4–1.0)
Beta Globulin: 0.8 g/dL (ref 0.7–1.3)
Gamma Globulin: 1.3 g/dL (ref 0.4–1.8)
Globulin, Total: 2.8 g/dL (ref 2.2–3.9)
Total Protein ELP: 6.5 g/dL (ref 6.0–8.5)

## 2018-12-11 LAB — KAPPA/LAMBDA LIGHT CHAINS
Kappa free light chain: 48.9 mg/L — ABNORMAL HIGH (ref 3.3–19.4)
Kappa, lambda light chain ratio: 1.66 — ABNORMAL HIGH (ref 0.26–1.65)
Lambda free light chains: 29.4 mg/L — ABNORMAL HIGH (ref 5.7–26.3)

## 2018-12-19 ENCOUNTER — Other Ambulatory Visit: Payer: Self-pay

## 2018-12-19 ENCOUNTER — Inpatient Hospital Stay (HOSPITAL_BASED_OUTPATIENT_CLINIC_OR_DEPARTMENT_OTHER): Payer: Medicare Other | Admitting: Hematology & Oncology

## 2018-12-19 ENCOUNTER — Inpatient Hospital Stay: Payer: Medicare Other

## 2018-12-19 ENCOUNTER — Ambulatory Visit: Payer: Medicare Other | Admitting: Hematology & Oncology

## 2018-12-19 ENCOUNTER — Encounter: Payer: Self-pay | Admitting: Hematology & Oncology

## 2018-12-19 ENCOUNTER — Inpatient Hospital Stay: Payer: Medicare Other | Attending: Hematology & Oncology

## 2018-12-19 VITALS — BP 142/81 | HR 54 | Temp 97.3°F | Resp 18 | Wt 218.1 lb

## 2018-12-19 DIAGNOSIS — C9001 Multiple myeloma in remission: Secondary | ICD-10-CM

## 2018-12-19 DIAGNOSIS — C9000 Multiple myeloma not having achieved remission: Secondary | ICD-10-CM | POA: Diagnosis present

## 2018-12-19 DIAGNOSIS — Z5112 Encounter for antineoplastic immunotherapy: Secondary | ICD-10-CM | POA: Diagnosis not present

## 2018-12-19 DIAGNOSIS — Z79899 Other long term (current) drug therapy: Secondary | ICD-10-CM | POA: Insufficient documentation

## 2018-12-19 DIAGNOSIS — Z9484 Stem cells transplant status: Secondary | ICD-10-CM | POA: Insufficient documentation

## 2018-12-19 LAB — CBC WITH DIFFERENTIAL (CANCER CENTER ONLY)
Abs Immature Granulocytes: 0.01 10*3/uL (ref 0.00–0.07)
Basophils Absolute: 0 10*3/uL (ref 0.0–0.1)
Basophils Relative: 1 %
Eosinophils Absolute: 0.1 10*3/uL (ref 0.0–0.5)
Eosinophils Relative: 3 %
HCT: 37.9 % — ABNORMAL LOW (ref 39.0–52.0)
Hemoglobin: 13.4 g/dL (ref 13.0–17.0)
Immature Granulocytes: 0 %
Lymphocytes Relative: 38 %
Lymphs Abs: 1.1 10*3/uL (ref 0.7–4.0)
MCH: 36.4 pg — ABNORMAL HIGH (ref 26.0–34.0)
MCHC: 35.4 g/dL (ref 30.0–36.0)
MCV: 103 fL — ABNORMAL HIGH (ref 80.0–100.0)
Monocytes Absolute: 0.3 10*3/uL (ref 0.1–1.0)
Monocytes Relative: 12 %
Neutro Abs: 1.3 10*3/uL — ABNORMAL LOW (ref 1.7–7.7)
Neutrophils Relative %: 46 %
Platelet Count: 173 10*3/uL (ref 150–400)
RBC: 3.68 MIL/uL — ABNORMAL LOW (ref 4.22–5.81)
RDW: 13.1 % (ref 11.5–15.5)
WBC Count: 2.8 10*3/uL — ABNORMAL LOW (ref 4.0–10.5)
nRBC: 0 % (ref 0.0–0.2)

## 2018-12-19 LAB — CMP (CANCER CENTER ONLY)
ALT: 49 U/L — ABNORMAL HIGH (ref 0–44)
AST: 72 U/L — ABNORMAL HIGH (ref 15–41)
Albumin: 4.3 g/dL (ref 3.5–5.0)
Alkaline Phosphatase: 56 U/L (ref 38–126)
Anion gap: 6 (ref 5–15)
BUN: 15 mg/dL (ref 6–20)
CO2: 26 mmol/L (ref 22–32)
Calcium: 9.2 mg/dL (ref 8.9–10.3)
Chloride: 104 mmol/L (ref 98–111)
Creatinine: 1.32 mg/dL — ABNORMAL HIGH (ref 0.61–1.24)
GFR, Est AFR Am: >60 mL/min (ref >60)
GFR, Estimated: >60 mL/min (ref >60)
Glucose, Bld: 93 mg/dL (ref 70–99)
Potassium: 4.3 mmol/L (ref 3.5–5.1)
Sodium: 136 mmol/L (ref 135–145)
Total Bilirubin: 0.8 mg/dL (ref 0.3–1.2)
Total Protein: 7.2 g/dL (ref 6.5–8.1)

## 2018-12-19 MED ORDER — BORTEZOMIB CHEMO SQ INJECTION 3.5 MG (2.5MG/ML)
3.0000 mg | Freq: Once | INTRAMUSCULAR | Status: AC
  Administered 2018-12-19: 3 mg via SUBCUTANEOUS
  Filled 2018-12-19: qty 1.2

## 2018-12-19 MED ORDER — IXAZOMIB CITRATE 4 MG PO CAPS: ORAL_CAPSULE | ORAL | 2 refills | Status: DC

## 2018-12-19 NOTE — Patient Instructions (Signed)
Bortezomib injection What is this medicine? BORTEZOMIB (bor TEZ oh mib) is a medicine that targets proteins in cancer cells and stops the cancer cells from growing. It is used to treat multiple myeloma and mantle-cell lymphoma. This medicine may be used for other purposes; ask your health care provider or pharmacist if you have questions. COMMON BRAND NAME(S): Velcade What should I tell my health care provider before I take this medicine? They need to know if you have any of these conditions:  diabetes  heart disease  irregular heartbeat  liver disease  on hemodialysis  low blood counts, like low white blood cells, platelets, or hemoglobin  peripheral neuropathy  taking medicine for blood pressure  an unusual or allergic reaction to bortezomib, mannitol, boron, other medicines, foods, dyes, or preservatives  pregnant or trying to get pregnant  breast-feeding How should I use this medicine? This medicine is for injection into a vein or for injection under the skin. It is given by a health care professional in a hospital or clinic setting. Talk to your pediatrician regarding the use of this medicine in children. Special care may be needed. Overdosage: If you think you have taken too much of this medicine contact a poison control center or emergency room at once. NOTE: This medicine is only for you. Do not share this medicine with others. What if I miss a dose? It is important not to miss your dose. Call your doctor or health care professional if you are unable to keep an appointment. What may interact with this medicine? This medicine may interact with the following medications:  ketoconazole  rifampin  ritonavir  St. John's Wort This list may not describe all possible interactions. Give your health care provider a list of all the medicines, herbs, non-prescription drugs, or dietary supplements you use. Also tell them if you smoke, drink alcohol, or use illegal drugs. Some  items may interact with your medicine. What should I watch for while using this medicine? You may get drowsy or dizzy. Do not drive, use machinery, or do anything that needs mental alertness until you know how this medicine affects you. Do not stand or sit up quickly, especially if you are an older patient. This reduces the risk of dizzy or fainting spells. In some cases, you may be given additional medicines to help with side effects. Follow all directions for their use. Call your doctor or health care professional for advice if you get a fever, chills or sore throat, or other symptoms of a cold or flu. Do not treat yourself. This drug decreases your body's ability to fight infections. Try to avoid being around people who are sick. This medicine may increase your risk to bruise or bleed. Call your doctor or health care professional if you notice any unusual bleeding. You may need blood work done while you are taking this medicine. In some patients, this medicine may cause a serious brain infection that may cause death. If you have any problems seeing, thinking, speaking, walking, or standing, tell your doctor right away. If you cannot reach your doctor, urgently seek other source of medical care. Check with your doctor or health care professional if you get an attack of severe diarrhea, nausea and vomiting, or if you sweat a lot. The loss of too much body fluid can make it dangerous for you to take this medicine. Do not become pregnant while taking this medicine or for at least 7 months after stopping it. Women should inform their doctor   if they wish to become pregnant or think they might be pregnant. Men should not father a child while taking this medicine and for at least 4 months after stopping it. There is a potential for serious side effects to an unborn child. Talk to your health care professional or pharmacist for more information. Do not breast-feed an infant while taking this medicine or for 2  months after stopping it. This medicine may interfere with the ability to have a child. You should talk with your doctor or health care professional if you are concerned about your fertility. What side effects may I notice from receiving this medicine? Side effects that you should report to your doctor or health care professional as soon as possible:  allergic reactions like skin rash, itching or hives, swelling of the face, lips, or tongue  breathing problems  changes in hearing  changes in vision  fast, irregular heartbeat  feeling faint or lightheaded, falls  pain, tingling, numbness in the hands or feet  right upper belly pain  seizures  swelling of the ankles, feet, hands  unusual bleeding or bruising  unusually weak or tired  vomiting  yellowing of the eyes or skin Side effects that usually do not require medical attention (report to your doctor or health care professional if they continue or are bothersome):  changes in emotions or moods  constipation  diarrhea  loss of appetite  headache  irritation at site where injected  nausea This list may not describe all possible side effects. Call your doctor for medical advice about side effects. You may report side effects to FDA at 1-800-FDA-1088. Where should I keep my medicine? This drug is given in a hospital or clinic and will not be stored at home. NOTE: This sheet is a summary. It may not cover all possible information. If you have questions about this medicine, talk to your doctor, pharmacist, or health care provider.  2020 Elsevier/Gold Standard (2017-08-15 16:29:31)  

## 2018-12-19 NOTE — Progress Notes (Signed)
Okay to treat with ANC 1.3 per Dr. Ennever. 

## 2018-12-19 NOTE — Progress Notes (Signed)
Hematology and Oncology Follow Up Visit  Tyler Phillips [id number] 1965-11-01 53 y.o. 12/19/2018   Principle Diagnosis:  IgG Kappa myeloma - +4, +14, +17  Current Therapy:   Status post autologous stem cell transplant on 05/22/2015  S/p cycle 6 of RVD  Velcade q 2wk dosing/Revlimid 10mg  po q day (21/7)   Ninlaro 4 mg po q 2 week -- start on 01/02/2019   Interim History:  [name] is here today for follow-up.  He is now retired.  Today after he retired, he went to [place].  He went surfing.  He really had a good time.  He now is planning on going to [place] for a month or so.  He will be with his family.  They will be in the mountains.  He really thinks this is really a very nice vacation.  Since he will be gone for about a month and a half, we really need to keep him on therapy.  I think the best way to do this is with Ninlaro.  I think this would be very reasonable.  He can take this while he is out in [place].  I talked him about Ninlaro.  He has done a lot of reading on this.  He understands what the side effects might be.  I think that Ninlaro 4 mg every 2 weeks would be very reasonable to try.  He is doing well otherwise.  He has had no problems with nausea or vomiting.  He has had no bony pain.  There is been no leg swelling.  He has had no rashes.  His last myeloma studies did not show any monoclonal spike in his blood.  Overall, his performance status is ECOG 0.    Medications:  Allergies as of 12/19/2018   No Known Allergies     Medication List       Accurate as of December 19, 2018 12:56 PM. If you have any questions, ask your nurse or doctor.        aspirin 325 MG tablet Take 325 mg by mouth daily.   bortezomib IV 3.5 MG injection Commonly known as: VELCADE Inject as directed every 14 (fourteen) days.   cetirizine 10 MG tablet Commonly known as: ZYRTEC Take 10 mg by mouth daily.   clindamycin 300 MG capsule Commonly known as: CLEOCIN Take 600  mg by mouth See admin instructions. 1 hour prior to dental appts   lenalidomide 10 MG capsule Commonly known as: Revlimid TAKE 1 CAPSULE DAILY FOR 21 DAYS ON AND 7 DAYS [id number]   LORazepam 1 MG tablet Commonly known as: ATIVAN Take 1 mg by mouth every 4 (four) hours as needed.   oxyCODONE 5 MG immediate release tablet Commonly known as: Oxy IR/ROXICODONE Take 5 mg by mouth every 4 (four) hours as needed.   penicillin v potassium 500 MG tablet Commonly known as: VEETID TAKE ONE TABLET (500 MG TOTAL) BY MOUTH EVERY 6 HOURS FOR 7 DAYS.   prochlorperazine 10 MG tablet Commonly known as: COMPAZINE Take 10 mg by mouth every 6 (six) hours as needed.   valACYclovir 500 MG tablet Commonly known as: VALTREX Take 500 mg by mouth 2 (two) times daily.   Vitamin D3 25 MCG (1000 UT) Caps Take by mouth.       Allergies: No Known Allergies  Past Medical History, Surgical history, Social history, and Family History were reviewed and updated.  Review of Systems: Review of Systems  Constitutional: Negative.   HENT: Negative.  Eyes: Negative.   Respiratory: Negative.   Cardiovascular: Negative.   Gastrointestinal: Negative.   Genitourinary: Negative.   Musculoskeletal: Negative.   Skin: Negative.   Neurological: Negative.   Endo/Heme/Allergies: Negative.   Psychiatric/Behavioral: Negative.      Physical Exam:  weight is 218 lb 2 oz (98.9 kg). His temporal temperature is 97.3 F (36.3 C) (abnormal). His blood pressure is 142/81 (abnormal) and his pulse is 54 (abnormal). His respiration is 18 and oxygen saturation is 100%.   Wt Readings from Last 3 Encounters:  12/19/18 218 lb 2 oz (98.9 kg)  12/08/18 219 lb 12.8 oz (99.7 kg)  11/10/18 216 lb (98 kg)    Physical Exam Vitals signs reviewed.  HENT:     Head: Normocephalic and atraumatic.  Eyes:     Pupils: Pupils are equal, round, and reactive to light.  Neck:     Musculoskeletal: Normal range of motion.   Cardiovascular:     Rate and Rhythm: Normal rate and regular rhythm.     Heart sounds: Normal heart sounds.  Pulmonary:     Effort: Pulmonary effort is normal.     Breath sounds: Normal breath sounds.  Abdominal:     General: Bowel sounds are normal.     Palpations: Abdomen is soft.  Musculoskeletal: Normal range of motion.        General: No tenderness or deformity.  Lymphadenopathy:     Cervical: No cervical adenopathy.  Skin:    General: Skin is warm and dry.     Findings: No erythema or rash.  Neurological:     Mental Status: He is alert and oriented to person, place, and time.  Psychiatric:        Behavior: Behavior normal.        Thought Content: Thought content normal.        Judgment: Judgment normal.      Lab Results  Component Value Date   WBC 2.8 (L) 12/19/2018   HGB 13.4 12/19/2018   HCT 37.9 (L) 12/19/2018   MCV 103.0 (H) 12/19/2018   PLT 173 12/19/2018   Lab Results  Component Value Date   FERRITIN 1,263 (H) 07/20/2014   IRON 125 07/20/2014   TIBC 198 (L) 07/20/2014   UIBC 73 (L) 07/20/2014   IRONPCTSAT 63 (H) 07/20/2014   Lab Results  Component Value Date   RETICCTPCT 0.8 07/20/2014   RBC 3.68 (L) 12/19/2018   Lab Results  Component Value Date   KPAFRELGTCHN 48.9 (H) 12/08/2018   LAMBDASER 29.4 (H) 12/08/2018   KAPLAMBRATIO 1.66 (H) 12/08/2018   Lab Results  Component Value Date   IGGSERUM 1,186 12/08/2018   IGA 218 12/08/2018   IGMSERUM 19 (L) 12/08/2018   Lab Results  Component Value Date   TOTALPROTELP 6.5 12/08/2018   ALBUMINELP 3.7 12/08/2018   A1GS 0.2 12/08/2018   A2GS 0.6 12/08/2018   BETS 0.8 12/08/2018   BETA2SER 0.3 03/28/2015   GAMS 1.3 12/08/2018   MSPIKE Not Observed 12/08/2018   SPEI Comment 08/18/2018     Chemistry      Component Value Date/Time   NA 136 12/19/2018 0950   NA 140 04/22/2017 1140   NA 138 09/03/2015 1042   K 4.3 12/19/2018 0950   K 4.0 04/22/2017 1140   K 4.3 09/03/2015 1042   CL 104  12/19/2018 0950   CL 105 04/22/2017 1140   CO2 26 12/19/2018 0950   CO2 24 04/22/2017 1140   CO2 25 09/03/2015 1042  BUN 15 12/19/2018 0950   BUN 16 04/22/2017 1140   BUN 21.7 09/03/2015 1042   CREATININE 1.32 (H) 12/19/2018 0950   CREATININE 1.6 (H) 04/22/2017 1140   CREATININE 1.2 09/03/2015 1042      Component Value Date/Time   CALCIUM 9.2 12/19/2018 0950   CALCIUM 8.5 04/22/2017 1140   CALCIUM 9.1 09/03/2015 1042   ALKPHOS 56 12/19/2018 0950   ALKPHOS 59 04/22/2017 1140   ALKPHOS 42 09/03/2015 1042   AST 72 (H) 12/19/2018 0950   AST 27 09/03/2015 1042   ALT 49 (H) 12/19/2018 0950   ALT 45 04/22/2017 1140   ALT 41 09/03/2015 1042   BILITOT 0.8 12/19/2018 0950   BILITOT 0.54 09/03/2015 1042      Impression and Plan: [name] is a pleasant 53 yo caucasian gentleman with IgG kappa myeloma.  He underwent induction chemotherapy with RVD.  He then underwent an autologous stem cell transplant at [facility] on February 2017.  I am very pleased that he is done so well.  He is now over 3 years out from his transplant.  His myeloma levels are not found in his blood.  I will worried about the light chain going up a little bit.  We last checked his kappa light chain, it was up slightly.  It was 4.9 mg/dL.  Previously, it was 4.4 mg/dL.  We still need to watch this.  For right now, we will have him come back to see [place] whenever he returns from [place].  I am not sure when this will be.  He will let us know when he comes back.  We will see about getting the Ninlaro for him.  Again he will just take this every 2 weeks.  He will stay on his Revlimid.     [name], MD 9/1/202012:56 PM

## 2018-12-20 ENCOUNTER — Telehealth: Payer: Self-pay | Admitting: Pharmacy Technician

## 2018-12-20 ENCOUNTER — Telehealth: Payer: Self-pay | Admitting: Pharmacist

## 2018-12-20 LAB — PROTEIN ELECTROPHORESIS, SERUM, WITH REFLEX
A/G Ratio: 1.4 (ref 0.7–1.7)
Albumin ELP: 4 g/dL (ref 2.9–4.4)
Alpha-1-Globulin: 0.2 g/dL (ref 0.0–0.4)
Alpha-2-Globulin: 0.7 g/dL (ref 0.4–1.0)
Beta Globulin: 0.8 g/dL (ref 0.7–1.3)
Gamma Globulin: 1.3 g/dL (ref 0.4–1.8)
Globulin, Total: 2.9 g/dL (ref 2.2–3.9)
Total Protein ELP: 6.9 g/dL (ref 6.0–8.5)

## 2018-12-20 LAB — IGG, IGA, IGM
IgA: 221 mg/dL (ref 90–386)
IgG (Immunoglobin G), Serum: 1254 mg/dL (ref 603–1613)
IgM (Immunoglobulin M), Srm: 20 mg/dL (ref 20–172)

## 2018-12-20 LAB — KAPPA/LAMBDA LIGHT CHAINS
Kappa free light chain: 44 mg/L — ABNORMAL HIGH (ref 3.3–19.4)
Kappa, lambda light chain ratio: 1.62 (ref 0.26–1.65)
Lambda free light chains: 27.2 mg/L — ABNORMAL HIGH (ref 5.7–26.3)

## 2018-12-20 NOTE — Telephone Encounter (Signed)
Oral Oncology Patient Advocate Encounter  After completing a benefits investigation, prior authorization for Tyler Phillips is not required at this time through [facility].  Patient's copay is $2731.11 for 3 capsules.  We will see if patient qualifies for grant assistance to help reduce his copay.    [place] Patient [place] Phone [phone] Fax [phone] 12/20/2018 8:57 AM

## 2018-12-20 NOTE — Telephone Encounter (Signed)
Patient to pick up medication today 12/20/2018 from [facility].

## 2018-12-20 NOTE — Telephone Encounter (Signed)
Oral Chemotherapy Pharmacist Encounter  Patient Education I spoke with patient for overview of new oral chemotherapy medication: Ninlaro (ixazomib) for the treatment of IgG Kappa myeloma in conjunction with Revlimid, planned duration until disease progression or unacceptable drug toxicity. [name] is treatment plan is being changed because he is going out of town for an extended period of time (~6 weeks). Dr. [name] documented a start date of 01/02/2019.    Counseled patient on administration, dosing, side effects, monitoring, drug-food interactions, safe handling, storage, and disposal. Patient will take 1 capsule (4 mg) by mouth every 2 weeks. Take on an empty stomach 1hr before or 2hr after meals..  Side effects include but not limited to: decreased wbc/plt, diarrhea, and constipation.    Reviewed with patient importance of keeping a medication schedule and plan for any missed doses.  [name] voiced understanding and appreciation. All questions answered. Medication handout emailed to patient.  Provided patient with [facility] phone number. Patient knows to call the office with questions or concerns. Oral Chemotherapy [facility] will continue to follow.  Darl Pikes, PharmD, BCPS, [facility] Hematology/Oncology Clinical Pharmacist [facility]/AP [facility] [phone]  12/20/2018 10:29 AM

## 2018-12-20 NOTE — Telephone Encounter (Signed)
Oral Oncology Pharmacist Encounter  Received new prescription for Ninlaro (ixazomib) for the treatment of IgG Kappa myeloma in conjunction with Revlimid, planned duration until disease progression or unacceptable drug toxicity. [name] is treatment plan is being changed because he is going out of town for an extended period of time.   CBC from 12/19/2018 assessed, no relevant lab abnormalities. Prescription dose and frequency assessed. Dr. [name] would like for him to take his Ninlaro every 2 weeks.   Current medication list in Epic reviewed, no DDIs with ixazomib identified.  Prescription has been e-scribed to the [facility] for benefits analysis and approval.  Oral Oncology Clinic will continue to follow for insurance authorization, copayment issues, initial counseling and start date.  Darl Pikes, PharmD, BCPS, [facility] Hematology/Oncology Clinical Pharmacist [facility] [place] [facility] [phone]  12/20/2018 8:43 AM

## 2018-12-22 ENCOUNTER — Ambulatory Visit: Payer: BC Managed Care – PPO

## 2018-12-22 ENCOUNTER — Ambulatory Visit: Payer: BC Managed Care – PPO | Admitting: Hematology & Oncology

## 2018-12-22 ENCOUNTER — Other Ambulatory Visit: Payer: BC Managed Care – PPO

## 2018-12-29 ENCOUNTER — Other Ambulatory Visit: Payer: Self-pay | Admitting: *Deleted

## 2018-12-29 DIAGNOSIS — C9002 Multiple myeloma in relapse: Secondary | ICD-10-CM

## 2018-12-29 MED ORDER — LENALIDOMIDE 10 MG PO CAPS: ORAL_CAPSULE | ORAL | 0 refills | Status: DC

## 2019-01-05 ENCOUNTER — Ambulatory Visit: Payer: BC Managed Care – PPO

## 2019-01-05 ENCOUNTER — Other Ambulatory Visit: Payer: BC Managed Care – PPO

## 2019-01-05 ENCOUNTER — Other Ambulatory Visit: Payer: Self-pay | Admitting: Hematology & Oncology

## 2019-01-05 ENCOUNTER — Ambulatory Visit: Payer: BC Managed Care – PPO | Admitting: Hematology & Oncology

## 2019-01-05 DIAGNOSIS — C9002 Multiple myeloma in relapse: Secondary | ICD-10-CM

## 2019-01-06 ENCOUNTER — Other Ambulatory Visit: Payer: Self-pay | Admitting: Hematology & Oncology

## 2019-01-06 DIAGNOSIS — C9002 Multiple myeloma in relapse: Secondary | ICD-10-CM

## 2019-01-12 ENCOUNTER — Other Ambulatory Visit: Payer: Self-pay | Admitting: *Deleted

## 2019-01-12 DIAGNOSIS — C9002 Multiple myeloma in relapse: Secondary | ICD-10-CM

## 2019-01-14 ENCOUNTER — Encounter: Payer: Self-pay | Admitting: Hematology & Oncology

## 2019-01-15 ENCOUNTER — Other Ambulatory Visit: Payer: Self-pay | Admitting: *Deleted

## 2019-01-15 DIAGNOSIS — C9000 Multiple myeloma not having achieved remission: Secondary | ICD-10-CM

## 2019-01-15 MED ORDER — LORAZEPAM 1 MG PO TABS: 1.0000 mg | ORAL_TABLET | ORAL | 0 refills | Status: DC | PRN

## 2019-01-15 MED ORDER — ZOLPIDEM TARTRATE 10 MG PO TABS: 10.0000 mg | ORAL_TABLET | Freq: Every evening | ORAL | 0 refills | Status: DC | PRN

## 2019-01-17 ENCOUNTER — Other Ambulatory Visit: Payer: Self-pay | Admitting: *Deleted

## 2019-01-17 DIAGNOSIS — C9002 Multiple myeloma in relapse: Secondary | ICD-10-CM

## 2019-01-17 MED ORDER — LENALIDOMIDE 10 MG PO CAPS: ORAL_CAPSULE | ORAL | 0 refills | Status: DC

## 2019-02-05 ENCOUNTER — Telehealth: Payer: Self-pay | Admitting: *Deleted

## 2019-02-05 NOTE — Telephone Encounter (Signed)
Call received from patient requesting that pharmacy for Revlimid be changed to [facility] and not [facility] d/t his insurance has changed.  Pharmacy changed in [place] per pt.'s request.

## 2019-02-15 ENCOUNTER — Other Ambulatory Visit: Payer: Self-pay | Admitting: *Deleted

## 2019-02-15 DIAGNOSIS — C9002 Multiple myeloma in relapse: Secondary | ICD-10-CM

## 2019-02-15 MED ORDER — LENALIDOMIDE 10 MG PO CAPS: ORAL_CAPSULE | ORAL | 0 refills | Status: DC

## 2019-02-20 ENCOUNTER — Inpatient Hospital Stay: Payer: Medicare Other | Attending: Hematology & Oncology | Admitting: Hematology & Oncology

## 2019-02-20 ENCOUNTER — Other Ambulatory Visit: Payer: Self-pay

## 2019-02-20 ENCOUNTER — Inpatient Hospital Stay: Payer: Medicare Other

## 2019-02-20 ENCOUNTER — Encounter: Payer: Self-pay | Admitting: Hematology & Oncology

## 2019-02-20 VITALS — BP 133/83 | HR 59 | Temp 97.5°F | Resp 18

## 2019-02-20 DIAGNOSIS — Z5112 Encounter for antineoplastic immunotherapy: Secondary | ICD-10-CM | POA: Insufficient documentation

## 2019-02-20 DIAGNOSIS — C9001 Multiple myeloma in remission: Secondary | ICD-10-CM

## 2019-02-20 DIAGNOSIS — C9000 Multiple myeloma not having achieved remission: Secondary | ICD-10-CM | POA: Diagnosis present

## 2019-02-20 LAB — CBC WITH DIFFERENTIAL (CANCER CENTER ONLY)
Abs Immature Granulocytes: 0.01 10*3/uL (ref 0.00–0.07)
Basophils Absolute: 0 10*3/uL (ref 0.0–0.1)
Basophils Relative: 1 %
Eosinophils Absolute: 0.1 10*3/uL (ref 0.0–0.5)
Eosinophils Relative: 2 %
HCT: 38.3 % — ABNORMAL LOW (ref 39.0–52.0)
Hemoglobin: 13.5 g/dL (ref 13.0–17.0)
Immature Granulocytes: 0 %
Lymphocytes Relative: 18 %
Lymphs Abs: 0.6 10*3/uL — ABNORMAL LOW (ref 0.7–4.0)
MCH: 36.6 pg — ABNORMAL HIGH (ref 26.0–34.0)
MCHC: 35.2 g/dL (ref 30.0–36.0)
MCV: 103.8 fL — ABNORMAL HIGH (ref 80.0–100.0)
Monocytes Absolute: 0.4 10*3/uL (ref 0.1–1.0)
Monocytes Relative: 12 %
Neutro Abs: 2.1 10*3/uL (ref 1.7–7.7)
Neutrophils Relative %: 67 %
Platelet Count: 139 10*3/uL — ABNORMAL LOW (ref 150–400)
RBC: 3.69 MIL/uL — ABNORMAL LOW (ref 4.22–5.81)
RDW: 12.4 % (ref 11.5–15.5)
WBC Count: 3.1 10*3/uL — ABNORMAL LOW (ref 4.0–10.5)
nRBC: 0 % (ref 0.0–0.2)

## 2019-02-20 LAB — CMP (CANCER CENTER ONLY)
ALT: 47 U/L — ABNORMAL HIGH (ref 0–44)
AST: 27 U/L (ref 15–41)
Albumin: 4.5 g/dL (ref 3.5–5.0)
Alkaline Phosphatase: 63 U/L (ref 38–126)
Anion gap: 6 (ref 5–15)
BUN: 13 mg/dL (ref 6–20)
CO2: 26 mmol/L (ref 22–32)
Calcium: 9 mg/dL (ref 8.9–10.3)
Chloride: 105 mmol/L (ref 98–111)
Creatinine: 1.3 mg/dL — ABNORMAL HIGH (ref 0.61–1.24)
GFR, Est AFR Am: >60 mL/min (ref >60)
GFR, Estimated: >60 mL/min (ref >60)
Glucose, Bld: 83 mg/dL (ref 70–99)
Potassium: 4.3 mmol/L (ref 3.5–5.1)
Sodium: 137 mmol/L (ref 135–145)
Total Bilirubin: 0.5 mg/dL (ref 0.3–1.2)
Total Protein: 6.9 g/dL (ref 6.5–8.1)

## 2019-02-20 MED ORDER — BORTEZOMIB CHEMO SQ INJECTION 3.5 MG (2.5MG/ML)
3.0000 mg | Freq: Once | INTRAMUSCULAR | Status: AC
  Administered 2019-02-20: 3 mg via SUBCUTANEOUS
  Filled 2019-02-20: qty 1.2

## 2019-02-21 LAB — PROTEIN ELECTROPHORESIS, SERUM, WITH REFLEX
A/G Ratio: 1.3 (ref 0.7–1.7)
Albumin ELP: 3.9 g/dL (ref 2.9–4.4)
Alpha-1-Globulin: 0.2 g/dL (ref 0.0–0.4)
Alpha-2-Globulin: 0.6 g/dL (ref 0.4–1.0)
Beta Globulin: 0.9 g/dL (ref 0.7–1.3)
Gamma Globulin: 1.3 g/dL (ref 0.4–1.8)
Globulin, Total: 3 g/dL (ref 2.2–3.9)
Total Protein ELP: 6.9 g/dL (ref 6.0–8.5)

## 2019-02-21 LAB — IGG, IGA, IGM
IgA: 215 mg/dL (ref 90–386)
IgG (Immunoglobin G), Serum: 1327 mg/dL (ref 603–1613)
IgM (Immunoglobulin M), Srm: 19 mg/dL — ABNORMAL LOW (ref 20–172)

## 2019-02-21 LAB — KAPPA/LAMBDA LIGHT CHAINS
Kappa free light chain: 37.4 mg/L — ABNORMAL HIGH (ref 3.3–19.4)
Kappa, lambda light chain ratio: 1.49 (ref 0.26–1.65)
Lambda free light chains: 25.1 mg/L (ref 5.7–26.3)

## 2019-02-21 NOTE — Progress Notes (Signed)
Hematology and Oncology Follow Up Visit  Tyler Phillips [id number] 1966/03/04 53 y.o. 02/21/2019   Principle Diagnosis:  IgG Kappa myeloma - +4, +14, +17  Current Therapy:   Status post autologous stem cell transplant on 05/22/2015  S/p cycle 6 of RVD  Velcade q 2wk dosing/Revlimid 10mg  po q day (21/7)   Ninlaro 4 mg po q 2 week -- start on 01/02/2019 -- d/c on 02/10/2019   Interim History:  [name] is here today for follow-up.  He just got back from [place].  He and his family were there for a couple months.  They were in the [facility].  They had a fantastic time out there.  While he was out there, he actually took [employer].  This caused ton for him.  However, it was certainly much more convenient for him.  He really has not had any problems at all.  He feels great.  He was incredibly active while out in [place].  He and his family did a lot of hiking.  They were residing [place] elevation.  I took a few days to get use to that altitude but they did get used to it.  He had good food out there.  He ate bison and elk.  He has not had any problems with fatigue.  He has had no problems with pain.  He has had no problems with nausea or vomiting.  He has had no issues with fever.  There is been no bleeding.  Last time we saw him back in September, there is no monoclonal spike in his blood.  His I TG level was 1254 mg/dL.  His kappa light chain was 4.4 mg/dL.  He is thoroughly enjoying his retirement.  He has been doing other traveling.  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  It sounds like he will have a quiet Thanksgiving at home.    Overall, his performance status is ECOG 0.    Medications:  Allergies as of 02/20/2019   No Known Allergies     Medication List       Accurate as of February 20, 2019 11:59 PM. If you have any questions, ask your nurse or doctor.        aspirin 325 MG tablet Take 325 mg by mouth daily.   bortezomib IV 3.5 MG injection  Commonly known as: VELCADE Inject as directed every 14 (fourteen) days.   cetirizine 10 MG tablet Commonly known as: ZYRTEC Take 10 mg by mouth daily.   clindamycin 300 MG capsule Commonly known as: CLEOCIN Take 600 mg by mouth See admin instructions. 1 hour prior to dental appts   ixazomib citrate 4 MG capsule Commonly known as: Ninlaro Take 1 capsule (4 mg) by mouth every 2 weeks. Take on an empty stomach 1hr before or 2hr after meals.   lenalidomide 10 MG capsule Commonly known as: Revlimid TAKE 1 CAPSULE DAILY FOR 21 DAYS ON AND 7 DAYS [id number]   LORazepam 1 MG tablet Commonly known as: ATIVAN Take 1 tablet (1 mg total) by mouth every 4 (four) hours as needed.   oxyCODONE 5 MG immediate release tablet Commonly known as: Oxy IR/ROXICODONE Take 5 mg by mouth every 4 (four) hours as needed.   penicillin v potassium 500 MG tablet Commonly known as: VEETID TAKE ONE TABLET (500 MG TOTAL) BY MOUTH EVERY 6 HOURS FOR 7 DAYS.   prochlorperazine 10 MG tablet Commonly known as: COMPAZINE Take 10 mg by mouth every  6 (six) hours as needed.   valACYclovir 500 MG tablet Commonly known as: VALTREX Take 500 mg by mouth 2 (two) times daily.   Vitamin D3 25 MCG (1000 UT) Caps Take by mouth.   zolpidem 10 MG tablet Commonly known as: AMBIEN Take 1 tablet (10 mg total) by mouth at bedtime as needed for sleep.       Allergies: No Known Allergies  Past Medical History, Surgical history, Social history, and Family History were reviewed and updated.  Review of Systems: Review of Systems  Constitutional: Negative.   HENT: Negative.   Eyes: Negative.   Respiratory: Negative.   Cardiovascular: Negative.   Gastrointestinal: Negative.   Genitourinary: Negative.   Musculoskeletal: Negative.   Skin: Negative.   Neurological: Negative.   Endo/Heme/Allergies: Negative.   Psychiatric/Behavioral: Negative.      Physical Exam:  temporal temperature is 97.5 F (36.4 C)  (abnormal). His blood pressure is 133/83 and his pulse is 59 (abnormal). His respiration is 18 and oxygen saturation is 100%.   Wt Readings from Last 3 Encounters:  12/19/18 218 lb 2 oz (98.9 kg)  12/08/18 219 lb 12.8 oz (99.7 kg)  11/10/18 216 lb (98 kg)    Physical Exam Vitals signs reviewed.  HENT:     Head: Normocephalic and atraumatic.  Eyes:     Pupils: Pupils are equal, round, and reactive to light.  Neck:     Musculoskeletal: Normal range of motion.  Cardiovascular:     Rate and Rhythm: Normal rate and regular rhythm.     Heart sounds: Normal heart sounds.  Pulmonary:     Effort: Pulmonary effort is normal.     Breath sounds: Normal breath sounds.  Abdominal:     General: Bowel sounds are normal.     Palpations: Abdomen is soft.  Musculoskeletal: Normal range of motion.        General: No tenderness or deformity.  Lymphadenopathy:     Cervical: No cervical adenopathy.  Skin:    General: Skin is warm and dry.     Findings: No erythema or rash.  Neurological:     Mental Status: He is alert and oriented to person, place, and time.  Psychiatric:        Behavior: Behavior normal.        Thought Content: Thought content normal.        Judgment: Judgment normal.      Lab Results  Component Value Date   WBC 3.1 (L) 02/20/2019   HGB 13.5 02/20/2019   HCT 38.3 (L) 02/20/2019   MCV 103.8 (H) 02/20/2019   PLT 139 (L) 02/20/2019   Lab Results  Component Value Date   FERRITIN 1,263 (H) 07/20/2014   IRON 125 07/20/2014   TIBC 198 (L) 07/20/2014   UIBC 73 (L) 07/20/2014   IRONPCTSAT 63 (H) 07/20/2014   Lab Results  Component Value Date   RETICCTPCT 0.8 07/20/2014   RBC 3.69 (L) 02/20/2019   Lab Results  Component Value Date   KPAFRELGTCHN 37.4 (H) 02/20/2019   LAMBDASER 25.1 02/20/2019   KAPLAMBRATIO 1.49 02/20/2019   Lab Results  Component Value Date   IGGSERUM 1,327 02/20/2019   IGA 215 02/20/2019   IGMSERUM 19 (L) 02/20/2019   Lab Results   Component Value Date   TOTALPROTELP 6.9 12/19/2018   ALBUMINELP 4.0 12/19/2018   A1GS 0.2 12/19/2018   A2GS 0.7 12/19/2018   BETS 0.8 12/19/2018   BETA2SER 0.3 03/28/2015   GAMS 1.3 12/19/2018  MSPIKE Not Observed 12/19/2018   SPEI Comment 08/18/2018     Chemistry      Component Value Date/Time   NA 137 02/20/2019 0948   NA 140 04/22/2017 1140   NA 138 09/03/2015 1042   K 4.3 02/20/2019 0948   K 4.0 04/22/2017 1140   K 4.3 09/03/2015 1042   CL 105 02/20/2019 0948   CL 105 04/22/2017 1140   CO2 26 02/20/2019 0948   CO2 24 04/22/2017 1140   CO2 25 09/03/2015 1042   BUN 13 02/20/2019 0948   BUN 16 04/22/2017 1140   BUN 21.7 09/03/2015 1042   CREATININE 1.30 (H) 02/20/2019 0948   CREATININE 1.6 (H) 04/22/2017 1140   CREATININE 1.2 09/03/2015 1042      Component Value Date/Time   CALCIUM 9.0 02/20/2019 0948   CALCIUM 8.5 04/22/2017 1140   CALCIUM 9.1 09/03/2015 1042   ALKPHOS 63 02/20/2019 0948   ALKPHOS 59 04/22/2017 1140   ALKPHOS 42 09/03/2015 1042   AST 27 02/20/2019 0948   AST 27 09/03/2015 1042   ALT 47 (H) 02/20/2019 0948   ALT 45 04/22/2017 1140   ALT 41 09/03/2015 1042   BILITOT 0.5 02/20/2019 0948   BILITOT 0.54 09/03/2015 1042      Impression and Plan: [name] is a pleasant 53 yo caucasian gentleman with IgG kappa myeloma.  He underwent induction chemotherapy with RVD.  He then underwent an autologous stem cell transplant at [facility] on February 2017.  I am very pleased that he is done so well.  He is now almost 4 years out from his transplant.  His myeloma levels are not found in his blood.  I am happy that he had a wonderful time in [place].  We will not get him back onto Velcade.  This helps financially for him.  We will do Velcade every other week.  I will see him back in 6 weeks.  [name], MD 11/4/20205:21 PM

## 2019-02-22 ENCOUNTER — Telehealth: Payer: Self-pay | Admitting: Hematology & Oncology

## 2019-02-22 NOTE — Telephone Encounter (Signed)
lmom to inform patient of 11/17 appt at 1130 am per 11/3 LOS

## 2019-02-26 ENCOUNTER — Encounter: Payer: Self-pay | Admitting: Family

## 2019-02-27 ENCOUNTER — Encounter: Payer: Self-pay | Admitting: *Deleted

## 2019-02-27 NOTE — Progress Notes (Addendum)
Pt email with documentation from [place], Flu shot received 02/19/2019.

## 2019-03-02 ENCOUNTER — Other Ambulatory Visit: Payer: Self-pay | Admitting: Hematology & Oncology

## 2019-03-02 DIAGNOSIS — C9002 Multiple myeloma in relapse: Secondary | ICD-10-CM

## 2019-03-05 ENCOUNTER — Other Ambulatory Visit: Payer: Self-pay | Admitting: *Deleted

## 2019-03-05 DIAGNOSIS — C9001 Multiple myeloma in remission: Secondary | ICD-10-CM

## 2019-03-06 ENCOUNTER — Other Ambulatory Visit: Payer: Self-pay

## 2019-03-06 ENCOUNTER — Inpatient Hospital Stay: Payer: Medicare Other

## 2019-03-06 VITALS — BP 133/87 | HR 49 | Temp 97.8°F | Wt 215.0 lb

## 2019-03-06 DIAGNOSIS — C9001 Multiple myeloma in remission: Secondary | ICD-10-CM

## 2019-03-06 DIAGNOSIS — Z5112 Encounter for antineoplastic immunotherapy: Secondary | ICD-10-CM | POA: Diagnosis not present

## 2019-03-06 LAB — COMPREHENSIVE METABOLIC PANEL
ALT: 47 U/L — ABNORMAL HIGH (ref 0–44)
AST: 35 U/L (ref 15–41)
Albumin: 4.3 g/dL (ref 3.5–5.0)
Alkaline Phosphatase: 50 U/L (ref 38–126)
Anion gap: 7 (ref 5–15)
BUN: 18 mg/dL (ref 6–20)
CO2: 23 mmol/L (ref 22–32)
Calcium: 8.5 mg/dL — ABNORMAL LOW (ref 8.9–10.3)
Chloride: 109 mmol/L (ref 98–111)
Creatinine, Ser: 1.35 mg/dL — ABNORMAL HIGH (ref 0.61–1.24)
GFR calc Af Amer: >60 mL/min (ref >60)
GFR calc non Af Amer: 59 mL/min — ABNORMAL LOW (ref >60)
Glucose, Bld: 101 mg/dL — ABNORMAL HIGH (ref 70–99)
Potassium: 4 mmol/L (ref 3.5–5.1)
Sodium: 139 mmol/L (ref 135–145)
Total Bilirubin: 0.6 mg/dL (ref 0.3–1.2)
Total Protein: 6.4 g/dL — ABNORMAL LOW (ref 6.5–8.1)

## 2019-03-06 LAB — CBC WITH DIFFERENTIAL (CANCER CENTER ONLY)
Band Neutrophils: 0 %
Basophils Absolute: 0 10*3/uL (ref 0.0–0.1)
Basophils Relative: 0 %
Eosinophils Absolute: 0.1 10*3/uL (ref 0.0–0.5)
Eosinophils Relative: 1 %
HCT: 34.4 % — ABNORMAL LOW (ref 39.0–52.0)
Hemoglobin: 12.4 g/dL — ABNORMAL LOW (ref 13.0–17.0)
Lymphocytes Relative: 42 %
Lymphs Abs: 1.1 10*3/uL (ref 0.7–4.0)
MCH: 37 pg — ABNORMAL HIGH (ref 26.0–34.0)
MCHC: 36 g/dL (ref 30.0–36.0)
MCV: 102.7 fL — ABNORMAL HIGH (ref 80.0–100.0)
Monocytes Absolute: 0.3 10*3/uL (ref 0.1–1.0)
Monocytes Relative: 11 %
Neutro Abs: 1.2 10*3/uL — ABNORMAL LOW (ref 1.7–7.7)
Neutrophils Relative %: 46 %
Platelet Count: 123 10*3/uL — ABNORMAL LOW (ref 150–400)
RBC: 3.35 MIL/uL — ABNORMAL LOW (ref 4.22–5.81)
RDW: 13 % (ref 11.5–15.5)
WBC Count: 2.7 10*3/uL — ABNORMAL LOW (ref 4.0–10.5)
nRBC: 0 % (ref 0.0–0.2)

## 2019-03-06 MED ORDER — BORTEZOMIB CHEMO SQ INJECTION 3.5 MG (2.5MG/ML)
3.0000 mg | Freq: Once | INTRAMUSCULAR | Status: AC
  Administered 2019-03-06: 3 mg via SUBCUTANEOUS
  Filled 2019-03-06: qty 1.2

## 2019-03-06 NOTE — Progress Notes (Signed)
ANC = 1.2. Reviewed by Dr [name], okay to treat today.

## 2019-03-06 NOTE — Patient Instructions (Signed)
Bortezomib injection What is this medicine? BORTEZOMIB (bor TEZ oh mib) is a medicine that targets proteins in cancer cells and stops the cancer cells from growing. It is used to treat multiple myeloma and mantle-cell lymphoma. This medicine may be used for other purposes; ask your health care provider or pharmacist if you have questions. COMMON BRAND NAME(S): Velcade What should I tell my health care provider before I take this medicine? They need to know if you have any of these conditions:  diabetes  heart disease  irregular heartbeat  liver disease  on hemodialysis  low blood counts, like low white blood cells, platelets, or hemoglobin  peripheral neuropathy  taking medicine for blood pressure  an unusual or allergic reaction to bortezomib, mannitol, boron, other medicines, foods, dyes, or preservatives  pregnant or trying to get pregnant  breast-feeding How should I use this medicine? This medicine is for injection into a vein or for injection under the skin. It is given by a health care professional in a hospital or clinic setting. Talk to your pediatrician regarding the use of this medicine in children. Special care may be needed. Overdosage: If you think you have taken too much of this medicine contact a poison control center or emergency room at once. NOTE: This medicine is only for you. Do not share this medicine with others. What if I miss a dose? It is important not to miss your dose. Call your doctor or health care professional if you are unable to keep an appointment. What may interact with this medicine? This medicine may interact with the following medications:  ketoconazole  rifampin  ritonavir  St. John's Wort This list may not describe all possible interactions. Give your health care provider a list of all the medicines, herbs, non-prescription drugs, or dietary supplements you use. Also tell them if you smoke, drink alcohol, or use illegal drugs. Some  items may interact with your medicine. What should I watch for while using this medicine? You may get drowsy or dizzy. Do not drive, use machinery, or do anything that needs mental alertness until you know how this medicine affects you. Do not stand or sit up quickly, especially if you are an older patient. This reduces the risk of dizzy or fainting spells. In some cases, you may be given additional medicines to help with side effects. Follow all directions for their use. Call your doctor or health care professional for advice if you get a fever, chills or sore throat, or other symptoms of a cold or flu. Do not treat yourself. This drug decreases your body's ability to fight infections. Try to avoid being around people who are sick. This medicine may increase your risk to bruise or bleed. Call your doctor or health care professional if you notice any unusual bleeding. You may need blood work done while you are taking this medicine. In some patients, this medicine may cause a serious brain infection that may cause death. If you have any problems seeing, thinking, speaking, walking, or standing, tell your doctor right away. If you cannot reach your doctor, urgently seek other source of medical care. Check with your doctor or health care professional if you get an attack of severe diarrhea, nausea and vomiting, or if you sweat a lot. The loss of too much body fluid can make it dangerous for you to take this medicine. Do not become pregnant while taking this medicine or for at least 7 months after stopping it. Women should inform their doctor   if they wish to become pregnant or think they might be pregnant. Men should not father a child while taking this medicine and for at least 4 months after stopping it. There is a potential for serious side effects to an unborn child. Talk to your health care professional or pharmacist for more information. Do not breast-feed an infant while taking this medicine or for 2  months after stopping it. This medicine may interfere with the ability to have a child. You should talk with your doctor or health care professional if you are concerned about your fertility. What side effects may I notice from receiving this medicine? Side effects that you should report to your doctor or health care professional as soon as possible:  allergic reactions like skin rash, itching or hives, swelling of the face, lips, or tongue  breathing problems  changes in hearing  changes in vision  fast, irregular heartbeat  feeling faint or lightheaded, falls  pain, tingling, numbness in the hands or feet  right upper belly pain  seizures  swelling of the ankles, feet, hands  unusual bleeding or bruising  unusually weak or tired  vomiting  yellowing of the eyes or skin Side effects that usually do not require medical attention (report to your doctor or health care professional if they continue or are bothersome):  changes in emotions or moods  constipation  diarrhea  loss of appetite  headache  irritation at site where injected  nausea This list may not describe all possible side effects. Call your doctor for medical advice about side effects. You may report side effects to FDA at 1-800-FDA-1088. Where should I keep my medicine? This drug is given in a hospital or clinic and will not be stored at home. NOTE: This sheet is a summary. It may not cover all possible information. If you have questions about this medicine, talk to your doctor, pharmacist, or health care provider.  2020 Elsevier/Gold Standard (2017-08-15 16:29:31)  

## 2019-03-20 ENCOUNTER — Other Ambulatory Visit: Payer: Self-pay

## 2019-03-20 ENCOUNTER — Inpatient Hospital Stay: Payer: Medicare Other

## 2019-03-20 ENCOUNTER — Inpatient Hospital Stay: Payer: Medicare Other | Attending: Hematology & Oncology | Admitting: Family

## 2019-03-20 ENCOUNTER — Encounter: Payer: Self-pay | Admitting: Family

## 2019-03-20 VITALS — BP 144/85 | HR 55 | Temp 97.1°F | Resp 17 | Ht 73.0 in | Wt 217.0 lb

## 2019-03-20 DIAGNOSIS — Z5112 Encounter for antineoplastic immunotherapy: Secondary | ICD-10-CM | POA: Diagnosis present

## 2019-03-20 DIAGNOSIS — C9001 Multiple myeloma in remission: Secondary | ICD-10-CM

## 2019-03-20 DIAGNOSIS — C9000 Multiple myeloma not having achieved remission: Secondary | ICD-10-CM | POA: Insufficient documentation

## 2019-03-20 LAB — CBC WITH DIFFERENTIAL (CANCER CENTER ONLY)
Abs Immature Granulocytes: 0 10*3/uL (ref 0.00–0.07)
Basophils Absolute: 0 10*3/uL (ref 0.0–0.1)
Basophils Relative: 1 %
Eosinophils Absolute: 0.1 10*3/uL (ref 0.0–0.5)
Eosinophils Relative: 2 %
HCT: 37.3 % — ABNORMAL LOW (ref 39.0–52.0)
Hemoglobin: 13.5 g/dL (ref 13.0–17.0)
Immature Granulocytes: 0 %
Lymphocytes Relative: 27 %
Lymphs Abs: 0.9 10*3/uL (ref 0.7–4.0)
MCH: 37.2 pg — ABNORMAL HIGH (ref 26.0–34.0)
MCHC: 36.2 g/dL — ABNORMAL HIGH (ref 30.0–36.0)
MCV: 102.8 fL — ABNORMAL HIGH (ref 80.0–100.0)
Monocytes Absolute: 0.4 10*3/uL (ref 0.1–1.0)
Monocytes Relative: 13 %
Neutro Abs: 1.9 10*3/uL (ref 1.7–7.7)
Neutrophils Relative %: 57 %
Platelet Count: 143 10*3/uL — ABNORMAL LOW (ref 150–400)
RBC: 3.63 MIL/uL — ABNORMAL LOW (ref 4.22–5.81)
RDW: 12.9 % (ref 11.5–15.5)
WBC Count: 3.2 10*3/uL — ABNORMAL LOW (ref 4.0–10.5)
nRBC: 0 % (ref 0.0–0.2)

## 2019-03-20 LAB — CMP (CANCER CENTER ONLY)
ALT: 50 U/L — ABNORMAL HIGH (ref 0–44)
AST: 25 U/L (ref 15–41)
Albumin: 4.6 g/dL (ref 3.5–5.0)
Alkaline Phosphatase: 62 U/L (ref 38–126)
Anion gap: 7 (ref 5–15)
BUN: 16 mg/dL (ref 6–20)
CO2: 27 mmol/L (ref 22–32)
Calcium: 8.8 mg/dL — ABNORMAL LOW (ref 8.9–10.3)
Chloride: 104 mmol/L (ref 98–111)
Creatinine: 1.31 mg/dL — ABNORMAL HIGH (ref 0.61–1.24)
GFR, Est AFR Am: >60 mL/min (ref >60)
GFR, Estimated: >60 mL/min (ref >60)
Glucose, Bld: 110 mg/dL — ABNORMAL HIGH (ref 70–99)
Potassium: 3.9 mmol/L (ref 3.5–5.1)
Sodium: 138 mmol/L (ref 135–145)
Total Bilirubin: 0.5 mg/dL (ref 0.3–1.2)
Total Protein: 7.4 g/dL (ref 6.5–8.1)

## 2019-03-20 MED ORDER — BORTEZOMIB CHEMO SQ INJECTION 3.5 MG (2.5MG/ML)
3.0000 mg | Freq: Once | INTRAMUSCULAR | Status: AC
  Administered 2019-03-20: 3 mg via SUBCUTANEOUS
  Filled 2019-03-20: qty 1.2

## 2019-03-20 NOTE — Patient Instructions (Signed)
[facility] Discharge Instructions for Patients Receiving Chemotherapy  Today you received the following chemotherapy agents Velcade To help prevent nausea and vomiting after your treatment, we encourage you to take your nausea medication as prescribed.   If you develop nausea and vomiting that is not controlled by your nausea medication, call the clinic.   BELOW ARE SYMPTOMS THAT SHOULD BE REPORTED IMMEDIATELY:  *FEVER GREATER THAN 100.5 F  *CHILLS WITH OR WITHOUT FEVER  NAUSEA AND VOMITING THAT IS NOT CONTROLLED WITH YOUR NAUSEA MEDICATION  *UNUSUAL SHORTNESS OF BREATH  *UNUSUAL BRUISING OR BLEEDING  TENDERNESS IN MOUTH AND THROAT WITH OR WITHOUT PRESENCE OF ULCERS  *URINARY PROBLEMS  *BOWEL PROBLEMS  UNUSUAL RASH Items with * indicate a potential emergency and should be followed up as soon as possible.  Feel free to call the clinic should you have any questions or concerns. The clinic phone number is [phone].  Please show the CHEMO ALERT CARD at check-in to the Emergency Department and triage nurse.   

## 2019-03-20 NOTE — Progress Notes (Signed)
Hematology and Oncology Follow Up Visit  Tyler Phillips [id number] 03-29-1966 53 y.o. 03/20/2019   Principle Diagnosis:  IgG Kappa myeloma - +4, +14, +17  Past Therapy: Status post autologous stem cell transplant on 05/22/2015 S/pcycle 6 of RVD Ninlaro 4 mg po q 2 week -- start on 01/02/2019 -- d/c on 02/10/2019 - took while on vacation in [place]   Current Therapy:   Velcade q 2wk dosing/Revlimid 10mg  po q day (21/7)    Interim History:  [name] is here today for follow-up and treatment. He is doing well but admits that he misses doing Crossfit and has been a little less conditioned.  No M-spike detected in November. IgG level was 1,327 mg/dL and kappa light chains 3.74 mg/dL.  No issues with infection. No fever, chills, n/v, cough, rash, dizziness, SOB, chest pain, palpitations, abdominal pain or changes in bowel or bladder habits.  The mild neuropathy in his feet remains stable and has not effected his gait.  No falls or syncopal episodes.  He has maintained a good appetite and is staying well hydrated. His weight is stable.   ECOG Performance Status: 0 - Asymptomatic  Medications:  Allergies as of 03/20/2019   No Known Allergies     Medication List       Accurate as of March 20, 2019 10:54 AM. If you have any questions, ask your nurse or doctor.        aspirin 325 MG tablet Take 325 mg by mouth daily.   bortezomib IV 3.5 MG injection Commonly known as: VELCADE Inject as directed every 14 (fourteen) days.   cetirizine 10 MG tablet Commonly known as: ZYRTEC Take 10 mg by mouth daily.   clindamycin 300 MG capsule Commonly known as: CLEOCIN Take 600 mg by mouth See admin instructions. 1 hour prior to dental appts   ixazomib citrate 4 MG capsule Commonly known as: Ninlaro Take 1 capsule (4 mg) by mouth every 2 weeks. Take on an empty stomach 1hr before or 2hr after meals.   LORazepam 1 MG tablet Commonly known as: ATIVAN Take 1 tablet (1 mg total) by  mouth every 4 (four) hours as needed.   oxyCODONE 5 MG immediate release tablet Commonly known as: Oxy IR/ROXICODONE Take 5 mg by mouth every 4 (four) hours as needed.   penicillin v potassium 500 MG tablet Commonly known as: VEETID TAKE ONE TABLET (500 MG TOTAL) BY MOUTH EVERY 6 HOURS FOR 7 DAYS.   prochlorperazine 10 MG tablet Commonly known as: COMPAZINE Take 10 mg by mouth every 6 (six) hours as needed.   Revlimid 10 MG capsule Generic drug: lenalidomide TAKE 1 CAPSULE DAILY FOR 21 DAYS ON AND 7 DAYS OFF   valACYclovir 500 MG tablet Commonly known as: VALTREX Take 500 mg by mouth 2 (two) times daily.   Vitamin D3 25 MCG (1000 UT) Caps Take by mouth.   zolpidem 10 MG tablet Commonly known as: AMBIEN Take 1 tablet (10 mg total) by mouth at bedtime as needed for sleep.       Allergies: No Known Allergies  Past Medical History, Surgical history, Social history, and Family History were reviewed and updated.  Review of Systems: All other 10 point review of systems is negative.   Physical Exam:  vitals were not taken for this visit.   Wt Readings from Last 3 Encounters:  03/06/19 215 lb (97.5 kg)  12/19/18 218 lb 2 oz (98.9 kg)  12/08/18 219 lb 12.8 oz (99.7 kg)  Ocular: Sclerae unicteric, pupils equal, round and reactive to light Ear-nose-throat: Oropharynx clear, dentition fair Lymphatic: No cervical or supraclavicular adenopathy Lungs no rales or rhonchi, good excursion bilaterally Heart regular rate and rhythm, no murmur appreciated Abd soft, nontender, positive bowel sounds, no liver or spleen tip palpated on exam, no fluid wave  MSK no focal spinal tenderness, no joint edema Neuro: non-focal, well-oriented, appropriate affect Breasts: Deferred   Lab Results  Component Value Date   WBC 2.7 (L) 03/06/2019   HGB 12.4 (L) 03/06/2019   HCT 34.4 (L) 03/06/2019   MCV 102.7 (H) 03/06/2019   PLT 123 (L) 03/06/2019   Lab Results  Component Value Date    FERRITIN 1,263 (H) 07/20/2014   IRON 125 07/20/2014   TIBC 198 (L) 07/20/2014   UIBC 73 (L) 07/20/2014   IRONPCTSAT 63 (H) 07/20/2014   Lab Results  Component Value Date   RETICCTPCT 0.8 07/20/2014   RBC 3.35 (L) 03/06/2019   Lab Results  Component Value Date   KPAFRELGTCHN 37.4 (H) 02/20/2019   LAMBDASER 25.1 02/20/2019   KAPLAMBRATIO 1.49 02/20/2019   Lab Results  Component Value Date   IGGSERUM 1,327 02/20/2019   IGA 215 02/20/2019   IGMSERUM 19 (L) 02/20/2019   Lab Results  Component Value Date   TOTALPROTELP 6.9 02/20/2019   ALBUMINELP 3.9 02/20/2019   A1GS 0.2 02/20/2019   A2GS 0.6 02/20/2019   BETS 0.9 02/20/2019   BETA2SER 0.3 03/28/2015   GAMS 1.3 02/20/2019   MSPIKE Not Observed 02/20/2019   SPEI Comment 08/18/2018     Chemistry      Component Value Date/Time   NA 139 03/06/2019 1156   NA 140 04/22/2017 1140   NA 138 09/03/2015 1042   K 4.0 03/06/2019 1156   K 4.0 04/22/2017 1140   K 4.3 09/03/2015 1042   CL 109 03/06/2019 1156   CL 105 04/22/2017 1140   CO2 23 03/06/2019 1156   CO2 24 04/22/2017 1140   CO2 25 09/03/2015 1042   BUN 18 03/06/2019 1156   BUN 16 04/22/2017 1140   BUN 21.7 09/03/2015 1042   CREATININE 1.35 (H) 03/06/2019 1156   CREATININE 1.30 (H) 02/20/2019 0948   CREATININE 1.6 (H) 04/22/2017 1140   CREATININE 1.2 09/03/2015 1042      Component Value Date/Time   CALCIUM 8.5 (L) 03/06/2019 1156   CALCIUM 8.5 04/22/2017 1140   CALCIUM 9.1 09/03/2015 1042   ALKPHOS 50 03/06/2019 1156   ALKPHOS 59 04/22/2017 1140   ALKPHOS 42 09/03/2015 1042   AST 35 03/06/2019 1156   AST 27 02/20/2019 0948   AST 27 09/03/2015 1042   ALT 47 (H) 03/06/2019 1156   ALT 47 (H) 02/20/2019 0948   ALT 45 04/22/2017 1140   ALT 41 09/03/2015 1042   BILITOT 0.6 03/06/2019 1156   BILITOT 0.5 02/20/2019 0948   BILITOT 0.54 09/03/2015 1042       Impression and Plan: [name] is a pleasant 53 yo caucasian gentleman with IgG kappa myeloma. He  underwent induction chemotherapy with RVD and then had an autologous stem cell transplant at [facility] on February 2017. He continues to do well and his myeloma studies have looked remained stable.  We will proceed with treatment today as planned.  We will see for treatment every 2 weeks and follow-up in 4 weeks.  He will contact our office with any questions or concerns. We can certainly see him sooner if needed.   Laverna Peace, NP  12/1/202010:54 AM

## 2019-03-21 LAB — KAPPA/LAMBDA LIGHT CHAINS
Kappa free light chain: 40.5 mg/L — ABNORMAL HIGH (ref 3.3–19.4)
Kappa, lambda light chain ratio: 1.49 (ref 0.26–1.65)
Lambda free light chains: 27.1 mg/L — ABNORMAL HIGH (ref 5.7–26.3)

## 2019-03-21 LAB — PROTEIN ELECTROPHORESIS, SERUM, WITH REFLEX
A/G Ratio: 1.4 (ref 0.7–1.7)
Albumin ELP: 3.9 g/dL (ref 2.9–4.4)
Alpha-1-Globulin: 0.2 g/dL (ref 0.0–0.4)
Alpha-2-Globulin: 0.6 g/dL (ref 0.4–1.0)
Beta Globulin: 0.8 g/dL (ref 0.7–1.3)
Gamma Globulin: 1.2 g/dL (ref 0.4–1.8)
Globulin, Total: 2.8 g/dL (ref 2.2–3.9)
Total Protein ELP: 6.7 g/dL (ref 6.0–8.5)

## 2019-03-21 LAB — LACTATE DEHYDROGENASE: LDH: 150 U/L (ref 98–192)

## 2019-03-21 LAB — IGG, IGA, IGM
IgA: 223 mg/dL (ref 90–386)
IgG (Immunoglobin G), Serum: 1297 mg/dL (ref 603–1613)
IgM (Immunoglobulin M), Srm: 20 mg/dL (ref 20–172)

## 2019-03-27 ENCOUNTER — Other Ambulatory Visit: Payer: Self-pay | Admitting: *Deleted

## 2019-03-27 DIAGNOSIS — C9002 Multiple myeloma in relapse: Secondary | ICD-10-CM

## 2019-03-27 MED ORDER — REVLIMID 10 MG PO CAPS: ORAL_CAPSULE | ORAL | 0 refills | Status: DC

## 2019-03-28 ENCOUNTER — Other Ambulatory Visit: Payer: Self-pay | Admitting: Hematology & Oncology

## 2019-03-28 DIAGNOSIS — C9002 Multiple myeloma in relapse: Secondary | ICD-10-CM

## 2019-04-03 ENCOUNTER — Inpatient Hospital Stay: Payer: Medicare Other

## 2019-04-03 ENCOUNTER — Other Ambulatory Visit: Payer: Self-pay

## 2019-04-03 ENCOUNTER — Other Ambulatory Visit: Payer: Self-pay | Admitting: Hematology

## 2019-04-03 VITALS — BP 147/83 | HR 48 | Temp 97.6°F | Resp 16

## 2019-04-03 DIAGNOSIS — C9001 Multiple myeloma in remission: Secondary | ICD-10-CM

## 2019-04-03 DIAGNOSIS — Z5112 Encounter for antineoplastic immunotherapy: Secondary | ICD-10-CM | POA: Diagnosis not present

## 2019-04-03 LAB — CBC WITH DIFFERENTIAL (CANCER CENTER ONLY)
Abs Immature Granulocytes: 0 10*3/uL (ref 0.00–0.07)
Basophils Absolute: 0 10*3/uL (ref 0.0–0.1)
Basophils Relative: 1 %
Eosinophils Absolute: 0 10*3/uL (ref 0.0–0.5)
Eosinophils Relative: 1 %
HCT: 37.1 % — ABNORMAL LOW (ref 39.0–52.0)
Hemoglobin: 13 g/dL (ref 13.0–17.0)
Immature Granulocytes: 0 %
Lymphocytes Relative: 34 %
Lymphs Abs: 0.9 10*3/uL (ref 0.7–4.0)
MCH: 36.5 pg — ABNORMAL HIGH (ref 26.0–34.0)
MCHC: 35 g/dL (ref 30.0–36.0)
MCV: 104.2 fL — ABNORMAL HIGH (ref 80.0–100.0)
Monocytes Absolute: 0.3 10*3/uL (ref 0.1–1.0)
Monocytes Relative: 10 %
Neutro Abs: 1.4 10*3/uL — ABNORMAL LOW (ref 1.7–7.7)
Neutrophils Relative %: 54 %
Platelet Count: 142 10*3/uL — ABNORMAL LOW (ref 150–400)
RBC: 3.56 MIL/uL — ABNORMAL LOW (ref 4.22–5.81)
RDW: 13.1 % (ref 11.5–15.5)
WBC Count: 2.6 10*3/uL — ABNORMAL LOW (ref 4.0–10.5)
nRBC: 0 % (ref 0.0–0.2)

## 2019-04-03 LAB — CMP (CANCER CENTER ONLY)
ALT: 48 U/L — ABNORMAL HIGH (ref 0–44)
AST: 23 U/L (ref 15–41)
Albumin: 4.4 g/dL (ref 3.5–5.0)
Alkaline Phosphatase: 58 U/L (ref 38–126)
Anion gap: 3 — ABNORMAL LOW (ref 5–15)
BUN: 15 mg/dL (ref 6–20)
CO2: 28 mmol/L (ref 22–32)
Calcium: 9 mg/dL (ref 8.9–10.3)
Chloride: 107 mmol/L (ref 98–111)
Creatinine: 1.24 mg/dL (ref 0.61–1.24)
GFR, Est AFR Am: >60 mL/min (ref >60)
GFR, Estimated: >60 mL/min (ref >60)
Glucose, Bld: 93 mg/dL (ref 70–99)
Potassium: 4.6 mmol/L (ref 3.5–5.1)
Sodium: 138 mmol/L (ref 135–145)
Total Bilirubin: 0.5 mg/dL (ref 0.3–1.2)
Total Protein: 6.9 g/dL (ref 6.5–8.1)

## 2019-04-03 MED ORDER — BORTEZOMIB CHEMO SQ INJECTION 3.5 MG (2.5MG/ML)
3.0000 mg | Freq: Once | INTRAMUSCULAR | Status: AC
  Administered 2019-04-03: 3 mg via SUBCUTANEOUS
  Filled 2019-04-03: qty 1.2

## 2019-04-03 NOTE — Patient Instructions (Signed)
[facility] Discharge Instructions for Patients Receiving Chemotherapy  Today you received the following chemotherapy agents Velcade To help prevent nausea and vomiting after your treatment, we encourage you to take your nausea medication as prescribed.   If you develop nausea and vomiting that is not controlled by your nausea medication, call the clinic.   BELOW ARE SYMPTOMS THAT SHOULD BE REPORTED IMMEDIATELY:  *FEVER GREATER THAN 100.5 F  *CHILLS WITH OR WITHOUT FEVER  NAUSEA AND VOMITING THAT IS NOT CONTROLLED WITH YOUR NAUSEA MEDICATION  *UNUSUAL SHORTNESS OF BREATH  *UNUSUAL BRUISING OR BLEEDING  TENDERNESS IN MOUTH AND THROAT WITH OR WITHOUT PRESENCE OF ULCERS  *URINARY PROBLEMS  *BOWEL PROBLEMS  UNUSUAL RASH Items with * indicate a potential emergency and should be followed up as soon as possible.  Feel free to call the clinic should you have any questions or concerns. The clinic phone number is [phone].  Please show the CHEMO ALERT CARD at check-in to the Emergency Department and triage nurse.   

## 2019-04-17 ENCOUNTER — Inpatient Hospital Stay: Payer: Medicare Other

## 2019-04-17 ENCOUNTER — Ambulatory Visit: Payer: Medicare Other | Attending: Internal Medicine

## 2019-04-17 ENCOUNTER — Inpatient Hospital Stay: Payer: Medicare Other | Admitting: Hematology & Oncology

## 2019-04-17 DIAGNOSIS — Z20822 Contact with and (suspected) exposure to covid-19: Secondary | ICD-10-CM

## 2019-04-19 LAB — NOVEL CORONAVIRUS, NAA: SARS-CoV-2, NAA: NOT DETECTED

## 2019-04-23 ENCOUNTER — Other Ambulatory Visit: Payer: Self-pay | Admitting: Hematology & Oncology

## 2019-04-23 DIAGNOSIS — C9002 Multiple myeloma in relapse: Secondary | ICD-10-CM

## 2019-05-01 ENCOUNTER — Other Ambulatory Visit: Payer: Self-pay | Admitting: *Deleted

## 2019-05-01 DIAGNOSIS — C9002 Multiple myeloma in relapse: Secondary | ICD-10-CM

## 2019-05-01 MED ORDER — LENALIDOMIDE 10 MG PO CAPS: ORAL_CAPSULE | ORAL | 0 refills | Status: DC

## 2019-05-04 ENCOUNTER — Encounter: Payer: Self-pay | Admitting: Family

## 2019-05-04 ENCOUNTER — Inpatient Hospital Stay: Payer: Medicare Other

## 2019-05-04 ENCOUNTER — Telehealth: Payer: Self-pay | Admitting: Family

## 2019-05-04 ENCOUNTER — Inpatient Hospital Stay (HOSPITAL_BASED_OUTPATIENT_CLINIC_OR_DEPARTMENT_OTHER): Payer: Medicare Other | Admitting: Family

## 2019-05-04 ENCOUNTER — Other Ambulatory Visit: Payer: Self-pay

## 2019-05-04 ENCOUNTER — Inpatient Hospital Stay: Payer: Medicare Other | Attending: Hematology & Oncology

## 2019-05-04 ENCOUNTER — Other Ambulatory Visit: Payer: Self-pay | Admitting: Hematology

## 2019-05-04 VITALS — BP 141/87 | HR 44 | Temp 97.8°F | Resp 18 | Ht 73.0 in | Wt 215.1 lb

## 2019-05-04 VITALS — BP 147/88 | HR 51 | Temp 97.8°F | Resp 18

## 2019-05-04 DIAGNOSIS — Z79899 Other long term (current) drug therapy: Secondary | ICD-10-CM | POA: Diagnosis not present

## 2019-05-04 DIAGNOSIS — C9000 Multiple myeloma not having achieved remission: Secondary | ICD-10-CM | POA: Insufficient documentation

## 2019-05-04 DIAGNOSIS — Z5112 Encounter for antineoplastic immunotherapy: Secondary | ICD-10-CM | POA: Diagnosis present

## 2019-05-04 DIAGNOSIS — C9001 Multiple myeloma in remission: Secondary | ICD-10-CM

## 2019-05-04 LAB — CMP (CANCER CENTER ONLY)
ALT: 39 U/L (ref 0–44)
AST: 24 U/L (ref 15–41)
Albumin: 4.4 g/dL (ref 3.5–5.0)
Alkaline Phosphatase: 58 U/L (ref 38–126)
Anion gap: 5 (ref 5–15)
BUN: 17 mg/dL (ref 6–20)
CO2: 29 mmol/L (ref 22–32)
Calcium: 9.4 mg/dL (ref 8.9–10.3)
Chloride: 105 mmol/L (ref 98–111)
Creatinine: 1.35 mg/dL — ABNORMAL HIGH (ref 0.61–1.24)
GFR, Est AFR Am: >60 mL/min (ref >60)
GFR, Estimated: 59 mL/min — ABNORMAL LOW (ref >60)
Glucose, Bld: 98 mg/dL (ref 70–99)
Potassium: 4.2 mmol/L (ref 3.5–5.1)
Sodium: 139 mmol/L (ref 135–145)
Total Bilirubin: 0.8 mg/dL (ref 0.3–1.2)
Total Protein: 7.1 g/dL (ref 6.5–8.1)

## 2019-05-04 LAB — CBC WITH DIFFERENTIAL (CANCER CENTER ONLY)
Abs Immature Granulocytes: 0.01 10*3/uL (ref 0.00–0.07)
Basophils Absolute: 0 10*3/uL (ref 0.0–0.1)
Basophils Relative: 1 %
Eosinophils Absolute: 0 10*3/uL (ref 0.0–0.5)
Eosinophils Relative: 1 %
HCT: 37.1 % — ABNORMAL LOW (ref 39.0–52.0)
Hemoglobin: 13.3 g/dL (ref 13.0–17.0)
Immature Granulocytes: 0 %
Lymphocytes Relative: 44 %
Lymphs Abs: 1.2 10*3/uL (ref 0.7–4.0)
MCH: 37 pg — ABNORMAL HIGH (ref 26.0–34.0)
MCHC: 35.8 g/dL (ref 30.0–36.0)
MCV: 103.3 fL — ABNORMAL HIGH (ref 80.0–100.0)
Monocytes Absolute: 0.3 10*3/uL (ref 0.1–1.0)
Monocytes Relative: 12 %
Neutro Abs: 1.1 10*3/uL — ABNORMAL LOW (ref 1.7–7.7)
Neutrophils Relative %: 42 %
Platelet Count: 152 10*3/uL (ref 150–400)
RBC: 3.59 MIL/uL — ABNORMAL LOW (ref 4.22–5.81)
RDW: 12.8 % (ref 11.5–15.5)
WBC Count: 2.7 10*3/uL — ABNORMAL LOW (ref 4.0–10.5)
nRBC: 0 % (ref 0.0–0.2)

## 2019-05-04 LAB — LACTATE DEHYDROGENASE: LDH: 150 U/L (ref 98–192)

## 2019-05-04 MED ORDER — BORTEZOMIB CHEMO SQ INJECTION 3.5 MG (2.5MG/ML)
3.0000 mg | Freq: Once | INTRAMUSCULAR | Status: AC
  Administered 2019-05-04: 11:00:00 3 mg via SUBCUTANEOUS
  Filled 2019-05-04: qty 1.2

## 2019-05-04 NOTE — Telephone Encounter (Signed)
Appointments scheduled per 1/15 los

## 2019-05-04 NOTE — Progress Notes (Addendum)
Hematology and Oncology Follow Up Visit  Dekevion Phillips [id number] 11/21/1965 54 y.o. 05/04/2019   Principle Diagnosis:  IgG Kappa myeloma - +4, +14, +17  Past Therapy: Status post autologous stem cell transplant on 05/22/2015 S/pcycle 6 of RVD Ninlaro 4 mg po q 2 week -- start on 01/02/2019-- d/c on 02/10/2019 - took while on vacation in [place]   Current Therapy:   Velcade q 2wk dosing/Revlimid 10mg  po q day (21/7)    Interim History:  [name] is here today for follow-up and treatment. He is doing well but still missing his workouts. He is staying busy with some side ventures and is learning to enjoy his time off.  No M-spike detected in December. IgG level was 1,297 and kappa light chains 4.05 mg/dL.  No fever, chills, n/v, cough, rash, dizziness, SOB, chest pain, palpitations, abdominal pain or changes in bowel or bladder habits.  No episodes of bleeding. No bruising or petechiae.  No swelling or tenderness in his extremities. He has neuropathy in his toes that comes and goes.  No falls or syncope.  He is eating well and staying hydrated. His weight is stable.   ECOG Performance Status: 1 - Symptomatic but completely ambulatory  Medications:  Allergies as of 05/04/2019   No Known Allergies     Medication List       Accurate as of May 04, 2019  9:13 AM. If you have any questions, ask your nurse or doctor.        aspirin 325 MG tablet Take 325 mg by mouth daily.   bortezomib IV 3.5 MG injection Commonly known as: VELCADE Inject as directed every 14 (fourteen) days.   cetirizine 10 MG tablet Commonly known as: ZYRTEC Take 10 mg by mouth daily.   clindamycin 300 MG capsule Commonly known as: CLEOCIN Take 600 mg by mouth See admin instructions. 1 hour prior to dental appts   ixazomib citrate 4 MG capsule Commonly known as: Ninlaro Take 1 capsule (4 mg) by mouth every 2 weeks. Take on an empty stomach 1hr before or 2hr after meals.   lenalidomide 10  MG capsule Commonly known as: Revlimid TAKE 1 CAPSULE BY MOUTH  DAILY FOR 21 DAYS ON, THEN  7 DAYS OFF Auth#-[id number]   LORazepam 1 MG tablet Commonly known as: ATIVAN Take 1 tablet (1 mg total) by mouth every 4 (four) hours as needed.   oxyCODONE 5 MG immediate release tablet Commonly known as: Oxy IR/ROXICODONE Take 5 mg by mouth every 4 (four) hours as needed.   penicillin v potassium 500 MG tablet Commonly known as: VEETID TAKE ONE TABLET (500 MG TOTAL) BY MOUTH EVERY 6 HOURS FOR 7 DAYS.   prochlorperazine 10 MG tablet Commonly known as: COMPAZINE Take 10 mg by mouth every 6 (six) hours as needed.   valACYclovir 500 MG tablet Commonly known as: VALTREX Take 500 mg by mouth 2 (two) times daily.   Vitamin D3 25 MCG (1000 UT) Caps Take by mouth.   zolpidem 10 MG tablet Commonly known as: AMBIEN Take 1 tablet (10 mg total) by mouth at bedtime as needed for sleep.       Allergies: No Known Allergies  Past Medical History, Surgical history, Social history, and Family History were reviewed and updated.  Review of Systems: All other 10 point review of systems is negative.   Physical Exam:  height is 6\' 1"  (1.854 m) and weight is 215 lb 1.9 oz (97.6 kg). His temporal temperature is 97.8  F (36.6 C). His blood pressure is 141/87 (abnormal) and his pulse is 44 (abnormal). His respiration is 18 and oxygen saturation is 100%.   Wt Readings from Last 3 Encounters:  05/04/19 215 lb 1.9 oz (97.6 kg)  03/20/19 217 lb (98.4 kg)  03/06/19 215 lb (97.5 kg)    Ocular: Sclerae unicteric, pupils equal, round and reactive to light Ear-nose-throat: Oropharynx clear, dentition fair Lymphatic: No cervical or supraclavicular adenopathy Lungs no rales or rhonchi, good excursion bilaterally Heart regular rate and rhythm, no murmur appreciated Abd soft, nontender, positive bowel sounds, no liver or spleen tip palpated on exam, no fluid wave  MSK no focal spinal tenderness, no joint  edema Neuro: non-focal, well-oriented, appropriate affect Breasts: Deferred   Lab Results  Component Value Date   WBC 2.7 (L) 05/04/2019   HGB 13.3 05/04/2019   HCT 37.1 (L) 05/04/2019   MCV 103.3 (H) 05/04/2019   PLT 152 05/04/2019   Lab Results  Component Value Date   FERRITIN 1,263 (H) 07/20/2014   IRON 125 07/20/2014   TIBC 198 (L) 07/20/2014   UIBC 73 (L) 07/20/2014   IRONPCTSAT 63 (H) 07/20/2014   Lab Results  Component Value Date   RETICCTPCT 0.8 07/20/2014   RBC 3.59 (L) 05/04/2019   Lab Results  Component Value Date   KPAFRELGTCHN 40.5 (H) 03/20/2019   LAMBDASER 27.1 (H) 03/20/2019   KAPLAMBRATIO 1.49 03/20/2019   Lab Results  Component Value Date   IGGSERUM 1,297 03/20/2019   IGA 223 03/20/2019   IGMSERUM 20 03/20/2019   Lab Results  Component Value Date   TOTALPROTELP 6.7 03/20/2019   ALBUMINELP 3.9 03/20/2019   A1GS 0.2 03/20/2019   A2GS 0.6 03/20/2019   BETS 0.8 03/20/2019   BETA2SER 0.3 03/28/2015   GAMS 1.2 03/20/2019   MSPIKE Not Observed 03/20/2019   SPEI Comment 08/18/2018     Chemistry      Component Value Date/Time   NA 138 04/03/2019 1022   NA 140 04/22/2017 1140   NA 138 09/03/2015 1042   K 4.6 04/03/2019 1022   K 4.0 04/22/2017 1140   K 4.3 09/03/2015 1042   CL 107 04/03/2019 1022   CL 105 04/22/2017 1140   CO2 28 04/03/2019 1022   CO2 24 04/22/2017 1140   CO2 25 09/03/2015 1042   BUN 15 04/03/2019 1022   BUN 16 04/22/2017 1140   BUN 21.7 09/03/2015 1042   CREATININE 1.24 04/03/2019 1022   CREATININE 1.6 (H) 04/22/2017 1140   CREATININE 1.2 09/03/2015 1042      Component Value Date/Time   CALCIUM 9.0 04/03/2019 1022   CALCIUM 8.5 04/22/2017 1140   CALCIUM 9.1 09/03/2015 1042   ALKPHOS 58 04/03/2019 1022   ALKPHOS 59 04/22/2017 1140   ALKPHOS 42 09/03/2015 1042   AST 23 04/03/2019 1022   AST 27 09/03/2015 1042   ALT 48 (H) 04/03/2019 1022   ALT 45 04/22/2017 1140   ALT 41 09/03/2015 1042   BILITOT 0.5 04/03/2019  1022   BILITOT 0.54 09/03/2015 1042       Impression and Plan: [name] is a pleasant 54 yo caucasian gentleman with IgG kappa myeloma. He underwent induction chemotherapy with RVD and then had an autologous stem cell transplant at [facility] on February 2017. Myeloma studies have remained stable. His results for today's labs are pending.  We will proceed with treatment today as planned.  Treatment every 2 weeks with follow-up in 4 weeks.  He will contact  our office with any questions or concerns. We can certainly see him sooner if needed.   Laverna Peace, NP 1/15/20219:13 AM

## 2019-05-04 NOTE — Progress Notes (Signed)
Ok to tx with ANC = 1.1 per Judson Roch, NP

## 2019-05-05 LAB — IGG, IGA, IGM
IgA: 171 mg/dL (ref 90–386)
IgG (Immunoglobin G), Serum: 1369 mg/dL (ref 603–1613)
IgM (Immunoglobulin M), Srm: 20 mg/dL (ref 20–172)

## 2019-05-07 LAB — PROTEIN ELECTROPHORESIS, SERUM
A/G Ratio: 1.5 (ref 0.7–1.7)
Albumin ELP: 4.3 g/dL (ref 2.9–4.4)
Alpha-1-Globulin: 0.1 g/dL (ref 0.0–0.4)
Alpha-2-Globulin: 0.5 g/dL (ref 0.4–1.0)
Beta Globulin: 0.9 g/dL (ref 0.7–1.3)
Gamma Globulin: 1.3 g/dL (ref 0.4–1.8)
Globulin, Total: 2.8 g/dL (ref 2.2–3.9)
Total Protein ELP: 7.1 g/dL (ref 6.0–8.5)

## 2019-05-07 LAB — KAPPA/LAMBDA LIGHT CHAINS
Kappa free light chain: 42.9 mg/L — ABNORMAL HIGH (ref 3.3–19.4)
Kappa, lambda light chain ratio: 1.67 — ABNORMAL HIGH (ref 0.26–1.65)
Lambda free light chains: 25.7 mg/L (ref 5.7–26.3)

## 2019-05-11 ENCOUNTER — Telehealth: Payer: Self-pay | Admitting: *Deleted

## 2019-05-11 ENCOUNTER — Other Ambulatory Visit: Payer: Self-pay | Admitting: Hematology & Oncology

## 2019-05-11 NOTE — Telephone Encounter (Signed)
Message received from patient wanting to know if Dr. [name] recommends that he receive the Covid vaccine.  Call placed back to patient and patient notified per order of Dr. [name] that he would like for pt to receive the Covid vaccine.  Pt appreciative of call back and has no further questions at this time.

## 2019-05-18 ENCOUNTER — Inpatient Hospital Stay: Payer: Medicare Other

## 2019-05-18 ENCOUNTER — Other Ambulatory Visit: Payer: Self-pay

## 2019-05-18 VITALS — BP 139/94 | HR 63 | Temp 97.1°F | Resp 17

## 2019-05-18 DIAGNOSIS — C9000 Multiple myeloma not having achieved remission: Secondary | ICD-10-CM

## 2019-05-18 DIAGNOSIS — C9001 Multiple myeloma in remission: Secondary | ICD-10-CM

## 2019-05-18 DIAGNOSIS — Z5112 Encounter for antineoplastic immunotherapy: Secondary | ICD-10-CM | POA: Diagnosis not present

## 2019-05-18 LAB — CBC WITH DIFFERENTIAL (CANCER CENTER ONLY)
Abs Immature Granulocytes: 0.01 10*3/uL (ref 0.00–0.07)
Basophils Absolute: 0 10*3/uL (ref 0.0–0.1)
Basophils Relative: 0 %
Eosinophils Absolute: 0.1 10*3/uL (ref 0.0–0.5)
Eosinophils Relative: 2 %
HCT: 36.6 % — ABNORMAL LOW (ref 39.0–52.0)
Hemoglobin: 13.1 g/dL (ref 13.0–17.0)
Immature Granulocytes: 0 %
Lymphocytes Relative: 29 %
Lymphs Abs: 0.9 10*3/uL (ref 0.7–4.0)
MCH: 36.1 pg — ABNORMAL HIGH (ref 26.0–34.0)
MCHC: 35.8 g/dL (ref 30.0–36.0)
MCV: 100.8 fL — ABNORMAL HIGH (ref 80.0–100.0)
Monocytes Absolute: 0.2 10*3/uL (ref 0.1–1.0)
Monocytes Relative: 7 %
Neutro Abs: 1.9 10*3/uL (ref 1.7–7.7)
Neutrophils Relative %: 62 %
Platelet Count: 153 10*3/uL (ref 150–400)
RBC: 3.63 MIL/uL — ABNORMAL LOW (ref 4.22–5.81)
RDW: 12.4 % (ref 11.5–15.5)
WBC Count: 3.1 10*3/uL — ABNORMAL LOW (ref 4.0–10.5)
nRBC: 0 % (ref 0.0–0.2)

## 2019-05-18 LAB — CMP (CANCER CENTER ONLY)
ALT: 52 U/L — ABNORMAL HIGH (ref 0–44)
AST: 25 U/L (ref 15–41)
Albumin: 4.4 g/dL (ref 3.5–5.0)
Alkaline Phosphatase: 54 U/L (ref 38–126)
Anion gap: 8 (ref 5–15)
BUN: 12 mg/dL (ref 6–20)
CO2: 25 mmol/L (ref 22–32)
Calcium: 9 mg/dL (ref 8.9–10.3)
Chloride: 107 mmol/L (ref 98–111)
Creatinine: 1.47 mg/dL — ABNORMAL HIGH (ref 0.61–1.24)
GFR, Est AFR Am: >60 mL/min (ref >60)
GFR, Estimated: 53 mL/min — ABNORMAL LOW (ref >60)
Glucose, Bld: 118 mg/dL — ABNORMAL HIGH (ref 70–99)
Potassium: 3.9 mmol/L (ref 3.5–5.1)
Sodium: 140 mmol/L (ref 135–145)
Total Bilirubin: 0.6 mg/dL (ref 0.3–1.2)
Total Protein: 7.1 g/dL (ref 6.5–8.1)

## 2019-05-18 MED ORDER — BORTEZOMIB CHEMO SQ INJECTION 3.5 MG (2.5MG/ML)
3.0000 mg | Freq: Once | INTRAMUSCULAR | Status: AC
  Administered 2019-05-18: 3 mg via SUBCUTANEOUS
  Filled 2019-05-18: qty 1.2

## 2019-05-18 NOTE — Patient Instructions (Signed)
[facility] Discharge Instructions for Patients Receiving Chemotherapy  Today you received the following chemotherapy agents Velcade To help prevent nausea and vomiting after your treatment, we encourage you to take your nausea medication as prescribed.   If you develop nausea and vomiting that is not controlled by your nausea medication, call the clinic.   BELOW ARE SYMPTOMS THAT SHOULD BE REPORTED IMMEDIATELY:  *FEVER GREATER THAN 100.5 F  *CHILLS WITH OR WITHOUT FEVER  NAUSEA AND VOMITING THAT IS NOT CONTROLLED WITH YOUR NAUSEA MEDICATION  *UNUSUAL SHORTNESS OF BREATH  *UNUSUAL BRUISING OR BLEEDING  TENDERNESS IN MOUTH AND THROAT WITH OR WITHOUT PRESENCE OF ULCERS  *URINARY PROBLEMS  *BOWEL PROBLEMS  UNUSUAL RASH Items with * indicate a potential emergency and should be followed up as soon as possible.  Feel free to call the clinic should you have any questions or concerns. The clinic phone number is [phone].  Please show the CHEMO ALERT CARD at check-in to the Emergency Department and triage nurse.   

## 2019-05-23 ENCOUNTER — Other Ambulatory Visit: Payer: Self-pay | Admitting: Hematology & Oncology

## 2019-05-23 DIAGNOSIS — C9002 Multiple myeloma in relapse: Secondary | ICD-10-CM

## 2019-05-30 ENCOUNTER — Other Ambulatory Visit: Payer: Self-pay | Admitting: *Deleted

## 2019-05-30 DIAGNOSIS — C9002 Multiple myeloma in relapse: Secondary | ICD-10-CM

## 2019-05-30 MED ORDER — LENALIDOMIDE 10 MG PO CAPS: ORAL_CAPSULE | ORAL | 0 refills | Status: DC

## 2019-06-01 ENCOUNTER — Inpatient Hospital Stay (HOSPITAL_BASED_OUTPATIENT_CLINIC_OR_DEPARTMENT_OTHER): Payer: Medicare Other | Admitting: Family

## 2019-06-01 ENCOUNTER — Inpatient Hospital Stay: Payer: Medicare Other

## 2019-06-01 ENCOUNTER — Encounter: Payer: Self-pay | Admitting: Family

## 2019-06-01 ENCOUNTER — Other Ambulatory Visit: Payer: Self-pay

## 2019-06-01 ENCOUNTER — Inpatient Hospital Stay: Payer: Medicare Other | Attending: Hematology & Oncology

## 2019-06-01 VITALS — BP 139/92 | HR 57 | Temp 96.6°F | Resp 18 | Ht 73.0 in | Wt 217.0 lb

## 2019-06-01 DIAGNOSIS — C9000 Multiple myeloma not having achieved remission: Secondary | ICD-10-CM | POA: Diagnosis present

## 2019-06-01 DIAGNOSIS — Z79899 Other long term (current) drug therapy: Secondary | ICD-10-CM | POA: Diagnosis not present

## 2019-06-01 DIAGNOSIS — T451X5A Adverse effect of antineoplastic and immunosuppressive drugs, initial encounter: Secondary | ICD-10-CM

## 2019-06-01 DIAGNOSIS — Z5112 Encounter for antineoplastic immunotherapy: Secondary | ICD-10-CM | POA: Insufficient documentation

## 2019-06-01 DIAGNOSIS — C9001 Multiple myeloma in remission: Secondary | ICD-10-CM

## 2019-06-01 DIAGNOSIS — G620 Drug-induced polyneuropathy: Secondary | ICD-10-CM

## 2019-06-01 LAB — CMP (CANCER CENTER ONLY)
ALT: 38 U/L (ref 0–44)
AST: 22 U/L (ref 15–41)
Albumin: 4.2 g/dL (ref 3.5–5.0)
Alkaline Phosphatase: 52 U/L (ref 38–126)
Anion gap: 5 (ref 5–15)
BUN: 18 mg/dL (ref 6–20)
CO2: 25 mmol/L (ref 22–32)
Calcium: 8.7 mg/dL — ABNORMAL LOW (ref 8.9–10.3)
Chloride: 109 mmol/L (ref 98–111)
Creatinine: 1.23 mg/dL (ref 0.61–1.24)
GFR, Est AFR Am: >60 mL/min (ref >60)
GFR, Estimated: >60 mL/min (ref >60)
Glucose, Bld: 82 mg/dL (ref 70–99)
Potassium: 4.1 mmol/L (ref 3.5–5.1)
Sodium: 139 mmol/L (ref 135–145)
Total Bilirubin: 0.5 mg/dL (ref 0.3–1.2)
Total Protein: 6.7 g/dL (ref 6.5–8.1)

## 2019-06-01 LAB — CBC WITH DIFFERENTIAL (CANCER CENTER ONLY)
Abs Immature Granulocytes: 0 10*3/uL (ref 0.00–0.07)
Basophils Absolute: 0 10*3/uL (ref 0.0–0.1)
Basophils Relative: 0 %
Eosinophils Absolute: 0 10*3/uL (ref 0.0–0.5)
Eosinophils Relative: 0 %
HCT: 35.1 % — ABNORMAL LOW (ref 39.0–52.0)
Hemoglobin: 12.8 g/dL — ABNORMAL LOW (ref 13.0–17.0)
Immature Granulocytes: 0 %
Lymphocytes Relative: 36 %
Lymphs Abs: 1 10*3/uL (ref 0.7–4.0)
MCH: 37.2 pg — ABNORMAL HIGH (ref 26.0–34.0)
MCHC: 36.5 g/dL — ABNORMAL HIGH (ref 30.0–36.0)
MCV: 102 fL — ABNORMAL HIGH (ref 80.0–100.0)
Monocytes Absolute: 0.3 10*3/uL (ref 0.1–1.0)
Monocytes Relative: 10 %
Neutro Abs: 1.6 10*3/uL — ABNORMAL LOW (ref 1.7–7.7)
Neutrophils Relative %: 54 %
Platelet Count: 135 10*3/uL — ABNORMAL LOW (ref 150–400)
RBC: 3.44 MIL/uL — ABNORMAL LOW (ref 4.22–5.81)
RDW: 12.3 % (ref 11.5–15.5)
WBC Count: 2.9 10*3/uL — ABNORMAL LOW (ref 4.0–10.5)
nRBC: 0 % (ref 0.0–0.2)

## 2019-06-01 MED ORDER — BORTEZOMIB CHEMO SQ INJECTION 3.5 MG (2.5MG/ML)
3.0000 mg | Freq: Once | INTRAMUSCULAR | Status: AC
  Administered 2019-06-01: 3 mg via SUBCUTANEOUS
  Filled 2019-06-01: qty 1.2

## 2019-06-01 NOTE — Progress Notes (Signed)
Hematology and Oncology Follow Up Visit  Tyler Phillips [id number] 1966-03-10 54 y.o. 06/01/2019   Principle Diagnosis:  IgG Kappa myeloma - +4, +14, +17  Past Therapy: Status post autologous stem cell transplant on 05/22/2015 S/pcycle 6 of RVD Ninlaro 4 mg po q 2 week -- start on 01/02/2019-- d/c on 02/10/2019- took while on vacation in [place]  Current Therapy:   Velcade q 2wk dosing/Revlimid 10mg  po q day (21/7)   Interim History: [name] is here today for follow-up and treatment.  He continues to do well and has had no new issues.  He is looking forward to better weather so he can resume is [employer] outside.  In January, M-spike not observed, IgG level 1,369 mg/dL and kappa light chains 4.29 mg/dL.  He is currently on his off week with Revlimid and is waiting for his next shipment to arrive. He still has loose stool during his on weeks.  No fever, chills, n/v, cough, rash, dizziness, SOB, chest pain, palpitations, abdominal pain or changes in bowel or bladder habits.  No swelling in his extremities.  The mild neuropathy (numbness) in his toes is unchanged.  No falls or syncopal episodes to report.  He has maintained a good appetite and is staying well hydrated. His weight is stable.   ECOG Performance Status: 1 - Symptomatic but completely ambulatory  Medications:  Allergies as of 06/01/2019   No Known Allergies     Medication List       Accurate as of June 01, 2019  2:00 PM. If you have any questions, ask your nurse or doctor.        aspirin 325 MG tablet Take 325 mg by mouth daily.   bortezomib IV 3.5 MG injection Commonly known as: VELCADE Inject as directed every 14 (fourteen) days.   cetirizine 10 MG tablet Commonly known as: ZYRTEC Take 10 mg by mouth daily.   clindamycin 300 MG capsule Commonly known as: CLEOCIN Take 600 mg by mouth See admin instructions. 1 hour prior to dental appts   ixazomib citrate 4 MG capsule Commonly  known as: Ninlaro Take 1 capsule (4 mg) by mouth every 2 weeks. Take on an empty stomach 1hr before or 2hr after meals.   lenalidomide 10 MG capsule Commonly known as: Revlimid TAKE 1 CAPSULE BY MOUTH  DAILY FOR 21 DAYS, THEN 7  DAYS OFF-Auth#[id number]   LORazepam 1 MG tablet Commonly known as: ATIVAN Take 1 tablet (1 mg total) by mouth every 4 (four) hours as needed.   oxyCODONE 5 MG immediate release tablet Commonly known as: Oxy IR/ROXICODONE Take 5 mg by mouth every 4 (four) hours as needed.   penicillin v potassium 500 MG tablet Commonly known as: VEETID TAKE ONE TABLET (500 MG TOTAL) BY MOUTH EVERY 6 HOURS FOR 7 DAYS.   prochlorperazine 10 MG tablet Commonly known as: COMPAZINE Take 10 mg by mouth every 6 (six) hours as needed.   valACYclovir 500 MG tablet Commonly known as: VALTREX Take 500 mg by mouth 2 (two) times daily.   Vitamin D3 25 MCG (1000 UT) Caps Take by mouth.   zolpidem 10 MG tablet Commonly known as: AMBIEN Take 1 tablet (10 mg total) by mouth at bedtime as needed for sleep.       Allergies: No Known Allergies  Past Medical History, Surgical history, Social history, and Family History were reviewed and updated.  Review of Systems: All other 10 point review of systems is negative.   Physical  Exam:  vitals were not taken for this visit.   Wt Readings from Last 3 Encounters:  05/04/19 215 lb 1.9 oz (97.6 kg)  03/20/19 217 lb (98.4 kg)  03/06/19 215 lb (97.5 kg)    Ocular: Sclerae unicteric, pupils equal, round and reactive to light Ear-nose-throat: Oropharynx clear, dentition fair Lymphatic: No cervical or supraclavicular adenopathy Lungs no rales or rhonchi, good excursion bilaterally Heart regular rate and rhythm, no murmur appreciated Abd soft, nontender, positive bowel sounds, no liver or spleen tip palpated on exam, no fluid wave  MSK no focal spinal tenderness, no joint edema Neuro: non-focal, well-oriented, appropriate  affect Breasts: Deferred   Lab Results  Component Value Date   WBC 2.9 (L) 06/01/2019   HGB 12.8 (L) 06/01/2019   HCT 35.1 (L) 06/01/2019   MCV 102.0 (H) 06/01/2019   PLT 135 (L) 06/01/2019   Lab Results  Component Value Date   FERRITIN 1,263 (H) 07/20/2014   IRON 125 07/20/2014   TIBC 198 (L) 07/20/2014   UIBC 73 (L) 07/20/2014   IRONPCTSAT 63 (H) 07/20/2014   Lab Results  Component Value Date   RETICCTPCT 0.8 07/20/2014   RBC 3.44 (L) 06/01/2019   Lab Results  Component Value Date   KPAFRELGTCHN 42.9 (H) 05/04/2019   LAMBDASER 25.7 05/04/2019   KAPLAMBRATIO 1.67 (H) 05/04/2019   Lab Results  Component Value Date   IGGSERUM 1,369 05/04/2019   IGA 171 05/04/2019   IGMSERUM 20 05/04/2019   Lab Results  Component Value Date   TOTALPROTELP 7.1 05/04/2019   ALBUMINELP 4.3 05/04/2019   A1GS 0.1 05/04/2019   A2GS 0.5 05/04/2019   BETS 0.9 05/04/2019   BETA2SER 0.3 03/28/2015   GAMS 1.3 05/04/2019   MSPIKE Not Observed 05/04/2019   SPEI Comment 05/04/2019     Chemistry      Component Value Date/Time   NA 140 05/18/2019 1328   NA 140 04/22/2017 1140   NA 138 09/03/2015 1042   K 3.9 05/18/2019 1328   K 4.0 04/22/2017 1140   K 4.3 09/03/2015 1042   CL 107 05/18/2019 1328   CL 105 04/22/2017 1140   CO2 25 05/18/2019 1328   CO2 24 04/22/2017 1140   CO2 25 09/03/2015 1042   BUN 12 05/18/2019 1328   BUN 16 04/22/2017 1140   BUN 21.7 09/03/2015 1042   CREATININE 1.47 (H) 05/18/2019 1328   CREATININE 1.6 (H) 04/22/2017 1140   CREATININE 1.2 09/03/2015 1042      Component Value Date/Time   CALCIUM 9.0 05/18/2019 1328   CALCIUM 8.5 04/22/2017 1140   CALCIUM 9.1 09/03/2015 1042   ALKPHOS 54 05/18/2019 1328   ALKPHOS 59 04/22/2017 1140   ALKPHOS 42 09/03/2015 1042   AST 25 05/18/2019 1328   AST 27 09/03/2015 1042   ALT 52 (H) 05/18/2019 1328   ALT 45 04/22/2017 1140   ALT 41 09/03/2015 1042   BILITOT 0.6 05/18/2019 1328   BILITOT 0.54 09/03/2015 1042        Impression and Plan: [name] is a pleasant 54 yo caucasian gentleman with IgG kappa myeloma. He underwent induction chemotherapy with RVD and then hadan autologous stem cell transplant at [facility] on February 2017. We will proceed with Velcade today as planned. He will continue on his same regimen with Revlimid.  Treatment every 2 weeks with follow-up in 4 weeks.  He will contact our office with any questions or concerns. We can certainly see him sooner if needed.  Laverna Peace, NP 2/12/20212:00 PM

## 2019-06-01 NOTE — Patient Instructions (Signed)
[facility] Discharge Instructions for Patients Receiving Chemotherapy  Today you received the following chemotherapy agents Velcade To help prevent nausea and vomiting after your treatment, we encourage you to take your nausea medication as prescribed.   If you develop nausea and vomiting that is not controlled by your nausea medication, call the clinic.   BELOW ARE SYMPTOMS THAT SHOULD BE REPORTED IMMEDIATELY:  *FEVER GREATER THAN 100.5 F  *CHILLS WITH OR WITHOUT FEVER  NAUSEA AND VOMITING THAT IS NOT CONTROLLED WITH YOUR NAUSEA MEDICATION  *UNUSUAL SHORTNESS OF BREATH  *UNUSUAL BRUISING OR BLEEDING  TENDERNESS IN MOUTH AND THROAT WITH OR WITHOUT PRESENCE OF ULCERS  *URINARY PROBLEMS  *BOWEL PROBLEMS  UNUSUAL RASH Items with * indicate a potential emergency and should be followed up as soon as possible.  Feel free to call the clinic should you have any questions or concerns. The clinic phone number is [phone].  Please show the CHEMO ALERT CARD at check-in to the Emergency Department and triage nurse.   

## 2019-06-02 LAB — IGG, IGA, IGM
IgA: 204 mg/dL (ref 90–386)
IgG (Immunoglobin G), Serum: 1355 mg/dL (ref 603–1613)
IgM (Immunoglobulin M), Srm: 18 mg/dL — ABNORMAL LOW (ref 20–172)

## 2019-06-03 LAB — KAPPA/LAMBDA LIGHT CHAINS
Kappa free light chain: 29.4 mg/L — ABNORMAL HIGH (ref 3.3–19.4)
Kappa, lambda light chain ratio: 1.17 (ref 0.26–1.65)
Lambda free light chains: 25.1 mg/L (ref 5.7–26.3)

## 2019-06-04 LAB — PROTEIN ELECTROPHORESIS, SERUM
A/G Ratio: 1.5 (ref 0.7–1.7)
Albumin ELP: 4.1 g/dL (ref 2.9–4.4)
Alpha-1-Globulin: 0.2 g/dL (ref 0.0–0.4)
Alpha-2-Globulin: 0.5 g/dL (ref 0.4–1.0)
Beta Globulin: 0.8 g/dL (ref 0.7–1.3)
Gamma Globulin: 1.3 g/dL (ref 0.4–1.8)
Globulin, Total: 2.8 g/dL (ref 2.2–3.9)
Total Protein ELP: 6.9 g/dL (ref 6.0–8.5)

## 2019-06-04 LAB — LACTATE DEHYDROGENASE: LDH: 146 U/L (ref 98–192)

## 2019-06-15 ENCOUNTER — Other Ambulatory Visit: Payer: Medicare Other

## 2019-06-15 ENCOUNTER — Inpatient Hospital Stay: Payer: Medicare Other

## 2019-06-18 ENCOUNTER — Other Ambulatory Visit: Payer: Self-pay | Admitting: *Deleted

## 2019-06-18 DIAGNOSIS — C9002 Multiple myeloma in relapse: Secondary | ICD-10-CM

## 2019-06-18 MED ORDER — LENALIDOMIDE 10 MG PO CAPS: ORAL_CAPSULE | ORAL | 0 refills | Status: DC

## 2019-06-21 ENCOUNTER — Other Ambulatory Visit: Payer: Self-pay | Admitting: *Deleted

## 2019-06-21 DIAGNOSIS — C9001 Multiple myeloma in remission: Secondary | ICD-10-CM

## 2019-06-22 ENCOUNTER — Inpatient Hospital Stay (HOSPITAL_BASED_OUTPATIENT_CLINIC_OR_DEPARTMENT_OTHER): Payer: Medicare Other | Admitting: Hematology & Oncology

## 2019-06-22 ENCOUNTER — Inpatient Hospital Stay: Payer: Medicare Other

## 2019-06-22 ENCOUNTER — Encounter: Payer: Self-pay | Admitting: Hematology & Oncology

## 2019-06-22 ENCOUNTER — Ambulatory Visit (HOSPITAL_BASED_OUTPATIENT_CLINIC_OR_DEPARTMENT_OTHER)
Admission: RE | Admit: 2019-06-22 | Discharge: 2019-06-22 | Disposition: A | Discharge status: Home / Self Care | Payer: Medicare Other | Source: Ambulatory Visit | Attending: Hematology & Oncology | Admitting: Hematology & Oncology

## 2019-06-22 ENCOUNTER — Other Ambulatory Visit: Payer: Self-pay

## 2019-06-22 ENCOUNTER — Telehealth: Payer: Self-pay | Admitting: *Deleted

## 2019-06-22 ENCOUNTER — Inpatient Hospital Stay: Payer: Medicare Other | Attending: Hematology & Oncology

## 2019-06-22 ENCOUNTER — Other Ambulatory Visit: Payer: Self-pay | Admitting: *Deleted

## 2019-06-22 DIAGNOSIS — C9001 Multiple myeloma in remission: Secondary | ICD-10-CM | POA: Diagnosis present

## 2019-06-22 DIAGNOSIS — C9000 Multiple myeloma not having achieved remission: Secondary | ICD-10-CM | POA: Insufficient documentation

## 2019-06-22 DIAGNOSIS — Z79899 Other long term (current) drug therapy: Secondary | ICD-10-CM | POA: Insufficient documentation

## 2019-06-22 DIAGNOSIS — Z9484 Stem cells transplant status: Secondary | ICD-10-CM | POA: Diagnosis not present

## 2019-06-22 DIAGNOSIS — R7989 Other specified abnormal findings of blood chemistry: Secondary | ICD-10-CM | POA: Diagnosis not present

## 2019-06-22 DIAGNOSIS — Z5112 Encounter for antineoplastic immunotherapy: Secondary | ICD-10-CM | POA: Insufficient documentation

## 2019-06-22 LAB — CBC WITH DIFFERENTIAL (CANCER CENTER ONLY)
Abs Immature Granulocytes: 0 10*3/uL (ref 0.00–0.07)
Basophils Absolute: 0 10*3/uL (ref 0.0–0.1)
Basophils Relative: 1 %
Eosinophils Absolute: 0.1 10*3/uL (ref 0.0–0.5)
Eosinophils Relative: 4 %
HCT: 36.2 % — ABNORMAL LOW (ref 39.0–52.0)
Hemoglobin: 13.1 g/dL (ref 13.0–17.0)
Immature Granulocytes: 0 %
Lymphocytes Relative: 34 %
Lymphs Abs: 0.9 10*3/uL (ref 0.7–4.0)
MCH: 36.7 pg — ABNORMAL HIGH (ref 26.0–34.0)
MCHC: 36.2 g/dL — ABNORMAL HIGH (ref 30.0–36.0)
MCV: 101.4 fL — ABNORMAL HIGH (ref 80.0–100.0)
Monocytes Absolute: 0.3 10*3/uL (ref 0.1–1.0)
Monocytes Relative: 11 %
Neutro Abs: 1.3 10*3/uL — ABNORMAL LOW (ref 1.7–7.7)
Neutrophils Relative %: 50 %
Platelet Count: 134 10*3/uL — ABNORMAL LOW (ref 150–400)
RBC: 3.57 MIL/uL — ABNORMAL LOW (ref 4.22–5.81)
RDW: 12.7 % (ref 11.5–15.5)
WBC Count: 2.5 10*3/uL — ABNORMAL LOW (ref 4.0–10.5)
nRBC: 0 % (ref 0.0–0.2)

## 2019-06-22 LAB — CMP (CANCER CENTER ONLY)
ALT: 165 U/L — ABNORMAL HIGH (ref 0–44)
AST: 264 U/L (ref 15–41)
Albumin: 4.4 g/dL (ref 3.5–5.0)
Alkaline Phosphatase: 50 U/L (ref 38–126)
Anion gap: 7 (ref 5–15)
BUN: 13 mg/dL (ref 6–20)
CO2: 26 mmol/L (ref 22–32)
Calcium: 9.4 mg/dL (ref 8.9–10.3)
Chloride: 106 mmol/L (ref 98–111)
Creatinine: 1.27 mg/dL — ABNORMAL HIGH (ref 0.61–1.24)
GFR, Est AFR Am: >60 mL/min (ref >60)
GFR, Estimated: >60 mL/min (ref >60)
Glucose, Bld: 87 mg/dL (ref 70–99)
Potassium: 4.3 mmol/L (ref 3.5–5.1)
Sodium: 139 mmol/L (ref 135–145)
Total Bilirubin: 0.8 mg/dL (ref 0.3–1.2)
Total Protein: 7.2 g/dL (ref 6.5–8.1)

## 2019-06-22 NOTE — Telephone Encounter (Signed)
Critical LFTs reported by lab.  Dr. [name] seeing patient in exam room

## 2019-06-22 NOTE — Progress Notes (Signed)
Hematology and Oncology Follow Up Visit  Tyler Phillips [id number] 06-04-1965 54 y.o. 06/22/2019   Principle Diagnosis:  IgG Kappa myeloma - +4, +14, +17  Current Therapy:   Status post autologous stem cell transplant on 05/22/2015  S/p cycle 6 of RVD  Velcade q 2wk dosing/Revlimid 10mg  po q day (21/7)   Ninlaro 4 mg po q 2 week -- start on 01/02/2019 -- d/c on 02/10/2019   Interim History:  [name] is here today for for treatment.  However, we did his lab work, we found that his liver function studies were quite high.  His SGPT and SGOT were about 8 times normal.  His bilirubin was fine.  From unsure as to why his liver functions are on the high side.  We did go ahead and get a ultrasound of his liver.  This all looked okay.  His gallbladder may have a little bit of thickening but no stones were noted.  I have to believe that this is some type of chemical hepatitis.  I will know if there are is any type of exposure to any kind of agent.  I know that he may have had a drink or 2.  I  know he exercises quite a bit.  He has had no travel lately.    There has been no nausea or vomiting.  He has had no change in bowel or bladder habits.  There has been no diarrhea.  He has had no rashes.  There is been no joint trouble.  He has had no cough.  Overall, he has been incredibly healthy.  Currently, his performance status is ECOG 0.    Medications:  Allergies as of 06/22/2019   No Known Allergies     Medication List       Accurate as of June 22, 2019  1:14 PM. If you have any questions, ask your nurse or doctor.        STOP taking these medications   clindamycin 300 MG capsule Commonly known as: CLEOCIN   ixazomib citrate 4 MG capsule Commonly known as: Ninlaro   oxyCODONE 5 MG immediate release tablet Commonly known as: Oxy IR/ROXICODONE   penicillin v potassium 500 MG tablet Commonly known as: VEETID     TAKE these medications   aspirin 325 MG tablet Take 325 mg by  mouth daily.   bortezomib IV 3.5 MG injection Commonly known as: VELCADE Inject as directed every 14 (fourteen) days.   cetirizine 10 MG tablet Commonly known as: ZYRTEC Take 10 mg by mouth daily.   lenalidomide 10 MG capsule Commonly known as: Revlimid TAKE 1 CAPSULE BY MOUTH  DAILY FOR 21 DAYS, THEN 7  DAYS OFF-Auth#[id number]   LORazepam 1 MG tablet Commonly known as: ATIVAN Take 1 tablet (1 mg total) by mouth every 4 (four) hours as needed.   prochlorperazine 10 MG tablet Commonly known as: COMPAZINE Take 10 mg by mouth every 6 (six) hours as needed.   valACYclovir 500 MG tablet Commonly known as: VALTREX Take 500 mg by mouth 2 (two) times daily.   Vitamin D3 25 MCG (1000 UT) Caps Take by mouth.   zolpidem 10 MG tablet Commonly known as: AMBIEN Take 1 tablet (10 mg total) by mouth at bedtime as needed for sleep.       Allergies: No Known Allergies  Past Medical History, Surgical history, Social history, and Family History were reviewed and updated.  Review of Systems: Review of Systems  Constitutional: Negative.  HENT: Negative.   Eyes: Negative.   Respiratory: Negative.   Cardiovascular: Negative.   Gastrointestinal: Negative.   Genitourinary: Negative.   Musculoskeletal: Negative.   Skin: Negative.   Neurological: Negative.   Endo/Heme/Allergies: Negative.   Psychiatric/Behavioral: Negative.      Physical Exam:  vitals were not taken for this visit.   Wt Readings from Last 3 Encounters:  06/01/19 217 lb (98.4 kg)  05/04/19 215 lb 1.9 oz (97.6 kg)  03/20/19 217 lb (98.4 kg)    Physical Exam Vitals reviewed.  HENT:     Head: Normocephalic and atraumatic.  Eyes:     Pupils: Pupils are equal, round, and reactive to light.  Cardiovascular:     Rate and Rhythm: Normal rate and regular rhythm.     Heart sounds: Normal heart sounds.  Pulmonary:     Effort: Pulmonary effort is normal.     Breath sounds: Normal breath sounds.  Abdominal:      General: Bowel sounds are normal.     Palpations: Abdomen is soft.  Musculoskeletal:        General: No tenderness or deformity. Normal range of motion.     Cervical back: Normal range of motion.  Lymphadenopathy:     Cervical: No cervical adenopathy.  Skin:    General: Skin is warm and dry.     Findings: No erythema or rash.  Neurological:     Mental Status: He is alert and oriented to person, place, and time.  Psychiatric:        Behavior: Behavior normal.        Thought Content: Thought content normal.        Judgment: Judgment normal.      Lab Results  Component Value Date   WBC 2.5 (L) 06/22/2019   HGB 13.1 06/22/2019   HCT 36.2 (L) 06/22/2019   MCV 101.4 (H) 06/22/2019   PLT 134 (L) 06/22/2019   Lab Results  Component Value Date   FERRITIN 1,263 (H) 07/20/2014   IRON 125 07/20/2014   TIBC 198 (L) 07/20/2014   UIBC 73 (L) 07/20/2014   IRONPCTSAT 63 (H) 07/20/2014   Lab Results  Component Value Date   RETICCTPCT 0.8 07/20/2014   RBC 3.57 (L) 06/22/2019   Lab Results  Component Value Date   KPAFRELGTCHN 29.4 (H) 06/01/2019   LAMBDASER 25.1 06/01/2019   KAPLAMBRATIO 1.17 06/01/2019   Lab Results  Component Value Date   IGGSERUM 1,355 06/01/2019   IGA 204 06/01/2019   IGMSERUM 18 (L) 06/01/2019   Lab Results  Component Value Date   TOTALPROTELP 6.9 06/01/2019   ALBUMINELP 4.1 06/01/2019   A1GS 0.2 06/01/2019   A2GS 0.5 06/01/2019   BETS 0.8 06/01/2019   BETA2SER 0.3 03/28/2015   GAMS 1.3 06/01/2019   MSPIKE Not Observed 06/01/2019   SPEI Comment 06/01/2019     Chemistry      Component Value Date/Time   NA 139 06/22/2019 0948   NA 140 04/22/2017 1140   NA 138 09/03/2015 1042   K 4.3 06/22/2019 0948   K 4.0 04/22/2017 1140   K 4.3 09/03/2015 1042   CL 106 06/22/2019 0948   CL 105 04/22/2017 1140   CO2 26 06/22/2019 0948   CO2 24 04/22/2017 1140   CO2 25 09/03/2015 1042   BUN 13 06/22/2019 0948   BUN 16 04/22/2017 1140   BUN 21.7  09/03/2015 1042   CREATININE 1.27 (H) 06/22/2019 0948   CREATININE 1.6 (H) 04/22/2017 1140  CREATININE 1.2 09/03/2015 1042      Component Value Date/Time   CALCIUM 9.4 06/22/2019 0948   CALCIUM 8.5 04/22/2017 1140   CALCIUM 9.1 09/03/2015 1042   ALKPHOS 50 06/22/2019 0948   ALKPHOS 59 04/22/2017 1140   ALKPHOS 42 09/03/2015 1042   AST 264 (HH) 06/22/2019 0948   AST 27 09/03/2015 1042   ALT 165 (H) 06/22/2019 0948   ALT 45 04/22/2017 1140   ALT 41 09/03/2015 1042   BILITOT 0.8 06/22/2019 0948   BILITOT 0.54 09/03/2015 1042      Impression and Plan: [name] is a pleasant 54 yo caucasian gentleman with IgG kappa myeloma.  He underwent induction chemotherapy with RVD.  He then underwent an autologous stem cell transplant at [facility] on February 2017.  We have him on maintenance therapy with Velcade and Revlimid.  These are sort of lower dose.  He has never had issues with these.  I think that this transient elevation of liver function studies will improve.  We will not treat him today.  I will have him come back in 1 week for labs.  I would we will get him back in 2 weeks and I am sure that his blood work will probably be back to normal at that time.    [name], MD 3/5/20211:14 PM

## 2019-06-25 ENCOUNTER — Ambulatory Visit: Payer: Medicare Other | Admitting: Hematology & Oncology

## 2019-06-29 ENCOUNTER — Inpatient Hospital Stay: Payer: Medicare Other

## 2019-06-29 ENCOUNTER — Other Ambulatory Visit: Payer: Medicare Other

## 2019-06-29 ENCOUNTER — Ambulatory Visit: Payer: Medicare Other | Admitting: Family

## 2019-07-06 ENCOUNTER — Other Ambulatory Visit: Payer: Self-pay

## 2019-07-06 ENCOUNTER — Inpatient Hospital Stay: Payer: Medicare Other

## 2019-07-06 VITALS — BP 148/99 | HR 59 | Temp 96.4°F | Resp 17

## 2019-07-06 DIAGNOSIS — C9001 Multiple myeloma in remission: Secondary | ICD-10-CM

## 2019-07-06 DIAGNOSIS — Z5112 Encounter for antineoplastic immunotherapy: Secondary | ICD-10-CM | POA: Diagnosis not present

## 2019-07-06 LAB — CBC WITH DIFFERENTIAL (CANCER CENTER ONLY)
Abs Immature Granulocytes: 0 10*3/uL (ref 0.00–0.07)
Basophils Absolute: 0 10*3/uL (ref 0.0–0.1)
Basophils Relative: 1 %
Eosinophils Absolute: 0.1 10*3/uL (ref 0.0–0.5)
Eosinophils Relative: 2 %
HCT: 37.6 % — ABNORMAL LOW (ref 39.0–52.0)
Hemoglobin: 13.3 g/dL (ref 13.0–17.0)
Immature Granulocytes: 0 %
Lymphocytes Relative: 38 %
Lymphs Abs: 0.8 10*3/uL (ref 0.7–4.0)
MCH: 36.7 pg — ABNORMAL HIGH (ref 26.0–34.0)
MCHC: 35.4 g/dL (ref 30.0–36.0)
MCV: 103.9 fL — ABNORMAL HIGH (ref 80.0–100.0)
Monocytes Absolute: 0.2 10*3/uL (ref 0.1–1.0)
Monocytes Relative: 9 %
Neutro Abs: 1.1 10*3/uL — ABNORMAL LOW (ref 1.7–7.7)
Neutrophils Relative %: 50 %
Platelet Count: 150 10*3/uL (ref 150–400)
RBC: 3.62 MIL/uL — ABNORMAL LOW (ref 4.22–5.81)
RDW: 12.8 % (ref 11.5–15.5)
WBC Count: 2.2 10*3/uL — ABNORMAL LOW (ref 4.0–10.5)
nRBC: 0 % (ref 0.0–0.2)

## 2019-07-06 LAB — COMPREHENSIVE METABOLIC PANEL
ALT: 57 U/L — ABNORMAL HIGH (ref 0–44)
AST: 29 U/L (ref 15–41)
Albumin: 4.2 g/dL (ref 3.5–5.0)
Alkaline Phosphatase: 50 U/L (ref 38–126)
Anion gap: 5 (ref 5–15)
BUN: 15 mg/dL (ref 6–20)
CO2: 29 mmol/L (ref 22–32)
Calcium: 9.2 mg/dL (ref 8.9–10.3)
Chloride: 105 mmol/L (ref 98–111)
Creatinine, Ser: 1.24 mg/dL (ref 0.61–1.24)
GFR calc Af Amer: >60 mL/min (ref >60)
GFR calc non Af Amer: >60 mL/min (ref >60)
Glucose, Bld: 92 mg/dL (ref 70–99)
Potassium: 4.5 mmol/L (ref 3.5–5.1)
Sodium: 139 mmol/L (ref 135–145)
Total Bilirubin: 0.5 mg/dL (ref 0.3–1.2)
Total Protein: 7.1 g/dL (ref 6.5–8.1)

## 2019-07-06 LAB — LACTATE DEHYDROGENASE: LDH: 178 U/L (ref 98–192)

## 2019-07-06 MED ORDER — BORTEZOMIB CHEMO SQ INJECTION 3.5 MG (2.5MG/ML)
3.0000 mg | Freq: Once | INTRAMUSCULAR | Status: AC
  Administered 2019-07-06: 3 mg via SUBCUTANEOUS
  Filled 2019-07-06: qty 1.2

## 2019-07-06 NOTE — Patient Instructions (Signed)
[facility] Discharge Instructions for Patients Receiving Chemotherapy  Today you received the following chemotherapy agents Velcade To help prevent nausea and vomiting after your treatment, we encourage you to take your nausea medication as prescribed.   If you develop nausea and vomiting that is not controlled by your nausea medication, call the clinic.   BELOW ARE SYMPTOMS THAT SHOULD BE REPORTED IMMEDIATELY:  *FEVER GREATER THAN 100.5 F  *CHILLS WITH OR WITHOUT FEVER  NAUSEA AND VOMITING THAT IS NOT CONTROLLED WITH YOUR NAUSEA MEDICATION  *UNUSUAL SHORTNESS OF BREATH  *UNUSUAL BRUISING OR BLEEDING  TENDERNESS IN MOUTH AND THROAT WITH OR WITHOUT PRESENCE OF ULCERS  *URINARY PROBLEMS  *BOWEL PROBLEMS  UNUSUAL RASH Items with * indicate a potential emergency and should be followed up as soon as possible.  Feel free to call the clinic should you have any questions or concerns. The clinic phone number is [phone].  Please show the CHEMO ALERT CARD at check-in to the Emergency Department and triage nurse.   

## 2019-07-06 NOTE — Progress Notes (Signed)
Reviewed pt labs with Dr. Maylon Peppers and pt ok to treat with ANC 1.1

## 2019-07-07 LAB — IGG, IGA, IGM
IgA: 213 mg/dL (ref 90–386)
IgG (Immunoglobin G), Serum: 1297 mg/dL (ref 603–1613)
IgM (Immunoglobulin M), Srm: 19 mg/dL — ABNORMAL LOW (ref 20–172)

## 2019-07-09 ENCOUNTER — Telehealth: Payer: Self-pay | Admitting: *Deleted

## 2019-07-09 LAB — KAPPA/LAMBDA LIGHT CHAINS
Kappa free light chain: 39.6 mg/L — ABNORMAL HIGH (ref 3.3–19.4)
Kappa, lambda light chain ratio: 1.43 (ref 0.26–1.65)
Lambda free light chains: 27.7 mg/L — ABNORMAL HIGH (ref 5.7–26.3)

## 2019-07-09 LAB — PROTEIN ELECTROPHORESIS, SERUM, WITH REFLEX
A/G Ratio: 1.2 (ref 0.7–1.7)
Albumin ELP: 3.9 g/dL (ref 2.9–4.4)
Alpha-1-Globulin: 0.2 g/dL (ref 0.0–0.4)
Alpha-2-Globulin: 0.6 g/dL (ref 0.4–1.0)
Beta Globulin: 0.9 g/dL (ref 0.7–1.3)
Gamma Globulin: 1.4 g/dL (ref 0.4–1.8)
Globulin, Total: 3.2 g/dL (ref 2.2–3.9)
Total Protein ELP: 7.1 g/dL (ref 6.0–8.5)

## 2019-07-09 NOTE — Telephone Encounter (Signed)
Message left on patient's private voicemail to notify him per order of Dr. [name] that "the liver function test is back to normal!!"  Instructed pt to call office back with any questions or concerns.

## 2019-07-09 NOTE — Telephone Encounter (Signed)
-----   Message from [name], MD sent at 07/08/2019  7:48 PM EDT ----- Call - the liver function test is back to normal!!!  Laurey Arrow

## 2019-07-12 ENCOUNTER — Other Ambulatory Visit: Payer: Self-pay | Admitting: *Deleted

## 2019-07-12 DIAGNOSIS — C9002 Multiple myeloma in relapse: Secondary | ICD-10-CM

## 2019-07-12 MED ORDER — LENALIDOMIDE 10 MG PO CAPS: ORAL_CAPSULE | ORAL | 0 refills | Status: DC

## 2019-07-13 ENCOUNTER — Inpatient Hospital Stay: Payer: Medicare Other

## 2019-07-13 ENCOUNTER — Other Ambulatory Visit: Payer: Medicare Other

## 2019-07-20 ENCOUNTER — Inpatient Hospital Stay (HOSPITAL_BASED_OUTPATIENT_CLINIC_OR_DEPARTMENT_OTHER): Payer: Medicare Other | Admitting: Family

## 2019-07-20 ENCOUNTER — Encounter: Payer: Self-pay | Admitting: Family

## 2019-07-20 ENCOUNTER — Other Ambulatory Visit: Payer: Self-pay

## 2019-07-20 ENCOUNTER — Inpatient Hospital Stay: Payer: Medicare Other

## 2019-07-20 ENCOUNTER — Inpatient Hospital Stay: Payer: Medicare Other | Attending: Hematology & Oncology

## 2019-07-20 VITALS — BP 130/79 | HR 56 | Temp 96.9°F | Resp 18 | Ht 73.0 in | Wt 215.0 lb

## 2019-07-20 DIAGNOSIS — T451X5A Adverse effect of antineoplastic and immunosuppressive drugs, initial encounter: Secondary | ICD-10-CM

## 2019-07-20 DIAGNOSIS — C9000 Multiple myeloma not having achieved remission: Secondary | ICD-10-CM | POA: Insufficient documentation

## 2019-07-20 DIAGNOSIS — C9001 Multiple myeloma in remission: Secondary | ICD-10-CM

## 2019-07-20 DIAGNOSIS — G620 Drug-induced polyneuropathy: Secondary | ICD-10-CM

## 2019-07-20 DIAGNOSIS — Z5112 Encounter for antineoplastic immunotherapy: Secondary | ICD-10-CM | POA: Insufficient documentation

## 2019-07-20 DIAGNOSIS — Z79899 Other long term (current) drug therapy: Secondary | ICD-10-CM | POA: Insufficient documentation

## 2019-07-20 LAB — CMP (CANCER CENTER ONLY)
ALT: 49 U/L — ABNORMAL HIGH (ref 0–44)
AST: 27 U/L (ref 15–41)
Albumin: 4.2 g/dL (ref 3.5–5.0)
Alkaline Phosphatase: 48 U/L (ref 38–126)
Anion gap: 4 — ABNORMAL LOW (ref 5–15)
BUN: 12 mg/dL (ref 6–20)
CO2: 29 mmol/L (ref 22–32)
Calcium: 9 mg/dL (ref 8.9–10.3)
Chloride: 106 mmol/L (ref 98–111)
Creatinine: 1.27 mg/dL — ABNORMAL HIGH (ref 0.61–1.24)
GFR, Est AFR Am: >60 mL/min (ref >60)
GFR, Estimated: >60 mL/min (ref >60)
Glucose, Bld: 86 mg/dL (ref 70–99)
Potassium: 4 mmol/L (ref 3.5–5.1)
Sodium: 139 mmol/L (ref 135–145)
Total Bilirubin: 0.6 mg/dL (ref 0.3–1.2)
Total Protein: 6.7 g/dL (ref 6.5–8.1)

## 2019-07-20 LAB — CBC WITH DIFFERENTIAL (CANCER CENTER ONLY)
Abs Immature Granulocytes: 0 10*3/uL (ref 0.00–0.07)
Basophils Absolute: 0 10*3/uL (ref 0.0–0.1)
Basophils Relative: 0 %
Eosinophils Absolute: 0.1 10*3/uL (ref 0.0–0.5)
Eosinophils Relative: 4 %
HCT: 36 % — ABNORMAL LOW (ref 39.0–52.0)
Hemoglobin: 12.8 g/dL — ABNORMAL LOW (ref 13.0–17.0)
Immature Granulocytes: 0 %
Lymphocytes Relative: 29 %
Lymphs Abs: 0.7 10*3/uL (ref 0.7–4.0)
MCH: 36.9 pg — ABNORMAL HIGH (ref 26.0–34.0)
MCHC: 35.6 g/dL (ref 30.0–36.0)
MCV: 103.7 fL — ABNORMAL HIGH (ref 80.0–100.0)
Monocytes Absolute: 0.2 10*3/uL (ref 0.1–1.0)
Monocytes Relative: 9 %
Neutro Abs: 1.3 10*3/uL — ABNORMAL LOW (ref 1.7–7.7)
Neutrophils Relative %: 58 %
Platelet Count: 134 10*3/uL — ABNORMAL LOW (ref 150–400)
RBC: 3.47 MIL/uL — ABNORMAL LOW (ref 4.22–5.81)
RDW: 12.9 % (ref 11.5–15.5)
WBC Count: 2.3 10*3/uL — ABNORMAL LOW (ref 4.0–10.5)
nRBC: 0 % (ref 0.0–0.2)

## 2019-07-20 LAB — LACTATE DEHYDROGENASE: LDH: 152 U/L (ref 98–192)

## 2019-07-20 MED ORDER — BORTEZOMIB CHEMO SQ INJECTION 3.5 MG (2.5MG/ML)
3.0000 mg | Freq: Once | INTRAMUSCULAR | Status: AC
  Administered 2019-07-20: 3 mg via SUBCUTANEOUS
  Filled 2019-07-20: qty 1.2

## 2019-07-20 NOTE — Patient Instructions (Signed)
Bortezomib injection What is this medicine? BORTEZOMIB (bor TEZ oh mib) is a medicine that targets proteins in cancer cells and stops the cancer cells from growing. It is used to treat multiple myeloma and mantle-cell lymphoma. This medicine may be used for other purposes; ask your health care provider or pharmacist if you have questions. COMMON BRAND NAME(S): Velcade What should I tell my health care provider before I take this medicine? They need to know if you have any of these conditions:  diabetes  heart disease  irregular heartbeat  liver disease  on hemodialysis  low blood counts, like low white blood cells, platelets, or hemoglobin  peripheral neuropathy  taking medicine for blood pressure  an unusual or allergic reaction to bortezomib, mannitol, boron, other medicines, foods, dyes, or preservatives  pregnant or trying to get pregnant  breast-feeding How should I use this medicine? This medicine is for injection into a vein or for injection under the skin. It is given by a health care professional in a hospital or clinic setting. Talk to your pediatrician regarding the use of this medicine in children. Special care may be needed. Overdosage: If you think you have taken too much of this medicine contact a poison control center or emergency room at once. NOTE: This medicine is only for you. Do not share this medicine with others. What if I miss a dose? It is important not to miss your dose. Call your doctor or health care professional if you are unable to keep an appointment. What may interact with this medicine? This medicine may interact with the following medications:  ketoconazole  rifampin  ritonavir  St. John's Wort This list may not describe all possible interactions. Give your health care provider a list of all the medicines, herbs, non-prescription drugs, or dietary supplements you use. Also tell them if you smoke, drink alcohol, or use illegal drugs. Some  items may interact with your medicine. What should I watch for while using this medicine? You may get drowsy or dizzy. Do not drive, use machinery, or do anything that needs mental alertness until you know how this medicine affects you. Do not stand or sit up quickly, especially if you are an older patient. This reduces the risk of dizzy or fainting spells. In some cases, you may be given additional medicines to help with side effects. Follow all directions for their use. Call your doctor or health care professional for advice if you get a fever, chills or sore throat, or other symptoms of a cold or flu. Do not treat yourself. This drug decreases your body's ability to fight infections. Try to avoid being around people who are sick. This medicine may increase your risk to bruise or bleed. Call your doctor or health care professional if you notice any unusual bleeding. You may need blood work done while you are taking this medicine. In some patients, this medicine may cause a serious brain infection that may cause death. If you have any problems seeing, thinking, speaking, walking, or standing, tell your doctor right away. If you cannot reach your doctor, urgently seek other source of medical care. Check with your doctor or health care professional if you get an attack of severe diarrhea, nausea and vomiting, or if you sweat a lot. The loss of too much body fluid can make it dangerous for you to take this medicine. Do not become pregnant while taking this medicine or for at least 7 months after stopping it. Women should inform their doctor   if they wish to become pregnant or think they might be pregnant. Men should not father a child while taking this medicine and for at least 4 months after stopping it. There is a potential for serious side effects to an unborn child. Talk to your health care professional or pharmacist for more information. Do not breast-feed an infant while taking this medicine or for 2  months after stopping it. This medicine may interfere with the ability to have a child. You should talk with your doctor or health care professional if you are concerned about your fertility. What side effects may I notice from receiving this medicine? Side effects that you should report to your doctor or health care professional as soon as possible:  allergic reactions like skin rash, itching or hives, swelling of the face, lips, or tongue  breathing problems  changes in hearing  changes in vision  fast, irregular heartbeat  feeling faint or lightheaded, falls  pain, tingling, numbness in the hands or feet  right upper belly pain  seizures  swelling of the ankles, feet, hands  unusual bleeding or bruising  unusually weak or tired  vomiting  yellowing of the eyes or skin Side effects that usually do not require medical attention (report to your doctor or health care professional if they continue or are bothersome):  changes in emotions or moods  constipation  diarrhea  loss of appetite  headache  irritation at site where injected  nausea This list may not describe all possible side effects. Call your doctor for medical advice about side effects. You may report side effects to FDA at 1-800-FDA-1088. Where should I keep my medicine? This drug is given in a hospital or clinic and will not be stored at home. NOTE: This sheet is a summary. It may not cover all possible information. If you have questions about this medicine, talk to your doctor, pharmacist, or health care provider.  2020 Elsevier/Gold Standard (2017-08-15 16:29:31)  

## 2019-07-20 NOTE — Progress Notes (Signed)
Hematology and Oncology Follow Up Visit  Marken Kintz [id number] 1965/10/26 54 y.o. 07/20/2019   Principle Diagnosis:  IgG Kappa myeloma - +4, +14, +17  Past Therapy: Status post autologous stem cell transplant on 05/22/2015  S/pcycle 6 of RVD  Ninlaro 4 mg po q 2 week -- start on 01/02/2019 -- d/c on 02/10/2019  Current Therapy:        Velcade q 2wk dosing/Revlimid 10mg  po q day (21/7)    Interim History:  [name] is here today for follow-up and treatment. He has been able to exercise and with the nice weather has added in a couple outdoor [employer] recently.  His myeloma studies have remained stable. 2 weeks ago no M-spike observed, IgG level 1,297 mg/dL and kappa light chains 39.6 mg/L His LFT's continue to improve. AST is 27 and ALT 49.  BUN is 12 and Creatinine 1.27.  WBC count 2.3, Hgb 12.8 and platelets 134.  He has had no issues with infection. No fever, chills, n/v, cough, rash, dizziness, SOB, chest pain, palpitations, abdominal pain or changes in bowel or bladder habits.  He will have a little constipation 1-2 days after receiving Velcade.  No swelling or tenderness in his extremities.  The neuropathy in his toes is stable/unchanged.  He has maintained a good appetite and is staying well hydrated. His weight is stable.   ECOG Performance Status: 1 - Symptomatic but completely ambulatory  Medications:  Allergies as of 07/20/2019   No Known Allergies     Medication List       Accurate as of July 20, 2019 11:27 AM. If you have any questions, ask your nurse or doctor.        aspirin 325 MG tablet Take 325 mg by mouth daily.   bortezomib IV 3.5 MG injection Commonly known as: VELCADE Inject as directed every 14 (fourteen) days.   cetirizine 10 MG tablet Commonly known as: ZYRTEC Take 10 mg by mouth daily.   lenalidomide 10 MG capsule Commonly known as: Revlimid TAKE 1 CAPSULE BY MOUTH  DAILY FOR 21 DAYS, THEN 7  DAYS OFF-Auth#[id number]     LORazepam 1 MG tablet Commonly known as: ATIVAN Take 1 tablet (1 mg total) by mouth every 4 (four) hours as needed.   prochlorperazine 10 MG tablet Commonly known as: COMPAZINE Take 10 mg by mouth every 6 (six) hours as needed.   valACYclovir 500 MG tablet Commonly known as: VALTREX Take 500 mg by mouth 2 (two) times daily.   Vitamin D3 25 MCG (1000 UT) Caps Take by mouth.   zolpidem 10 MG tablet Commonly known as: AMBIEN Take 1 tablet (10 mg total) by mouth at bedtime as needed for sleep.       Allergies: No Known Allergies  Past Medical History, Surgical history, Social history, and Family History were reviewed and updated.  Review of Systems: All other 10 point review of systems is negative.   Physical Exam:  height is 6\' 1"  (1.854 m) and weight is 215 lb (97.5 kg). His temporal temperature is 96.9 F (36.1 C) (abnormal). His blood pressure is 130/79 and his pulse is 56 (abnormal). His respiration is 18 and oxygen saturation is 100%.   Wt Readings from Last 3 Encounters:  07/20/19 215 lb (97.5 kg)  06/01/19 217 lb (98.4 kg)  05/04/19 215 lb 1.9 oz (97.6 kg)    Ocular: Sclerae unicteric, pupils equal, round and reactive to light Ear-nose-throat: Oropharynx clear, dentition fair Lymphatic: No cervical  or supraclavicular adenopathy Lungs no rales or rhonchi, good excursion bilaterally Heart regular rate and rhythm, no murmur appreciated Abd soft, nontender, positive bowel sounds, no liver or spleen tip palpated on exam, no fluid wave  MSK no focal spinal tenderness, no joint edema Neuro: non-focal, well-oriented, appropriate affect Breasts: Deferred   Lab Results  Component Value Date   WBC 2.3 (L) 07/20/2019   HGB 12.8 (L) 07/20/2019   HCT 36.0 (L) 07/20/2019   MCV 103.7 (H) 07/20/2019   PLT 134 (L) 07/20/2019   Lab Results  Component Value Date   FERRITIN 1,263 (H) 07/20/2014   IRON 125 07/20/2014   TIBC 198 (L) 07/20/2014   UIBC 73 (L) 07/20/2014    IRONPCTSAT 63 (H) 07/20/2014   Lab Results  Component Value Date   RETICCTPCT 0.8 07/20/2014   RBC 3.47 (L) 07/20/2019   Lab Results  Component Value Date   KPAFRELGTCHN 39.6 (H) 07/06/2019   LAMBDASER 27.7 (H) 07/06/2019   KAPLAMBRATIO 1.43 07/06/2019   Lab Results  Component Value Date   IGGSERUM 1,297 07/06/2019   IGA 213 07/06/2019   IGMSERUM 19 (L) 07/06/2019   Lab Results  Component Value Date   TOTALPROTELP 7.1 07/06/2019   ALBUMINELP 3.9 07/06/2019   A1GS 0.2 07/06/2019   A2GS 0.6 07/06/2019   BETS 0.9 07/06/2019   BETA2SER 0.3 03/28/2015   GAMS 1.4 07/06/2019   MSPIKE Not Observed 07/06/2019   SPEI Comment 06/01/2019     Chemistry      Component Value Date/Time   NA 139 07/20/2019 1019   NA 140 04/22/2017 1140   NA 138 09/03/2015 1042   K 4.0 07/20/2019 1019   K 4.0 04/22/2017 1140   K 4.3 09/03/2015 1042   CL 106 07/20/2019 1019   CL 105 04/22/2017 1140   CO2 29 07/20/2019 1019   CO2 24 04/22/2017 1140   CO2 25 09/03/2015 1042   BUN 12 07/20/2019 1019   BUN 16 04/22/2017 1140   BUN 21.7 09/03/2015 1042   CREATININE 1.27 (H) 07/20/2019 1019   CREATININE 1.6 (H) 04/22/2017 1140   CREATININE 1.2 09/03/2015 1042      Component Value Date/Time   CALCIUM 9.0 07/20/2019 1019   CALCIUM 8.5 04/22/2017 1140   CALCIUM 9.1 09/03/2015 1042   ALKPHOS 48 07/20/2019 1019   ALKPHOS 59 04/22/2017 1140   ALKPHOS 42 09/03/2015 1042   AST 27 07/20/2019 1019   AST 27 09/03/2015 1042   ALT 49 (H) 07/20/2019 1019   ALT 45 04/22/2017 1140   ALT 41 09/03/2015 1042   BILITOT 0.6 07/20/2019 1019   BILITOT 0.54 09/03/2015 1042       Impression and Plan: [name] is a very pleasant 54 yo caucasian gentleman with IgG kappa myeloma. He underwent induction chemotherapy with RVD.  He then underwent an autologous stem cell transplant at [facility] on February 2017.  He continues to do well on maintenance Revlimid and Velcade.  We will proceed with Velcade treatment today as  planned.  He will continue treatment every 2 weeks and follow-up in 4 weeks.  He will contact our office with any questions or concerns. We can certainly see him sooner if needed.   Laverna Peace, NP 4/2/202111:27 AM

## 2019-07-20 NOTE — Progress Notes (Signed)
Labs reviewed with Dr. [name].  Neutrophils 1.3.  Ok to treat per Dr. [name]

## 2019-07-21 LAB — IGG, IGA, IGM
IgA: 197 mg/dL (ref 90–386)
IgG (Immunoglobin G), Serum: 1296 mg/dL (ref 603–1613)
IgM (Immunoglobulin M), Srm: 18 mg/dL — ABNORMAL LOW (ref 20–172)

## 2019-07-23 LAB — PROTEIN ELECTROPHORESIS, SERUM
A/G Ratio: 1.5 (ref 0.7–1.7)
Albumin ELP: 4 g/dL (ref 2.9–4.4)
Alpha-1-Globulin: 0.1 g/dL (ref 0.0–0.4)
Alpha-2-Globulin: 0.5 g/dL (ref 0.4–1.0)
Beta Globulin: 0.8 g/dL (ref 0.7–1.3)
Gamma Globulin: 1.1 g/dL (ref 0.4–1.8)
Globulin, Total: 2.6 g/dL (ref 2.2–3.9)
Total Protein ELP: 6.6 g/dL (ref 6.0–8.5)

## 2019-07-23 LAB — KAPPA/LAMBDA LIGHT CHAINS
Kappa free light chain: 43.4 mg/L — ABNORMAL HIGH (ref 3.3–19.4)
Kappa, lambda light chain ratio: 1.49 (ref 0.26–1.65)
Lambda free light chains: 29.1 mg/L — ABNORMAL HIGH (ref 5.7–26.3)

## 2019-07-27 ENCOUNTER — Ambulatory Visit: Payer: Medicare Other | Admitting: Hematology & Oncology

## 2019-07-27 ENCOUNTER — Inpatient Hospital Stay: Payer: Medicare Other

## 2019-07-27 ENCOUNTER — Other Ambulatory Visit: Payer: Medicare Other

## 2019-07-27 NOTE — Progress Notes (Signed)
Pharmacist Chemotherapy Monitoring - Follow Up Assessment    I verify that I have reviewed each item in the below checklist:  . Regimen for the patient is scheduled for the appropriate day and plan matches scheduled date. Marland Kitchen Appropriate non-routine labs are ordered dependent on drug ordered. . If applicable, additional medications reviewed and ordered per protocol based on lifetime cumulative doses and/or treatment regimen.   Plan for follow-up and/or issues identified: No . I-vent associated with next due treatment: No . MD and/or nursing notified: No  Tyler Phillips, Tyler Phillips 07/27/2019 9:05 AM

## 2019-08-02 ENCOUNTER — Other Ambulatory Visit: Payer: Self-pay | Admitting: *Deleted

## 2019-08-02 DIAGNOSIS — C9002 Multiple myeloma in relapse: Secondary | ICD-10-CM

## 2019-08-02 MED ORDER — LENALIDOMIDE 10 MG PO CAPS: ORAL_CAPSULE | ORAL | 0 refills | Status: DC

## 2019-08-03 ENCOUNTER — Other Ambulatory Visit: Payer: Self-pay

## 2019-08-03 ENCOUNTER — Inpatient Hospital Stay: Payer: Medicare Other

## 2019-08-03 VITALS — BP 133/89 | HR 56 | Temp 97.8°F | Resp 17

## 2019-08-03 DIAGNOSIS — C9000 Multiple myeloma not having achieved remission: Secondary | ICD-10-CM

## 2019-08-03 DIAGNOSIS — Z5112 Encounter for antineoplastic immunotherapy: Secondary | ICD-10-CM | POA: Diagnosis not present

## 2019-08-03 DIAGNOSIS — C9001 Multiple myeloma in remission: Secondary | ICD-10-CM

## 2019-08-03 LAB — CMP (CANCER CENTER ONLY)
ALT: 58 U/L — ABNORMAL HIGH (ref 0–44)
AST: 31 U/L (ref 15–41)
Albumin: 4.3 g/dL (ref 3.5–5.0)
Alkaline Phosphatase: 52 U/L (ref 38–126)
Anion gap: 7 (ref 5–15)
BUN: 14 mg/dL (ref 6–20)
CO2: 26 mmol/L (ref 22–32)
Calcium: 9.7 mg/dL (ref 8.9–10.3)
Chloride: 104 mmol/L (ref 98–111)
Creatinine: 1.27 mg/dL — ABNORMAL HIGH (ref 0.61–1.24)
GFR, Est AFR Am: >60 mL/min (ref >60)
GFR, Estimated: >60 mL/min (ref >60)
Glucose, Bld: 93 mg/dL (ref 70–99)
Potassium: 3.9 mmol/L (ref 3.5–5.1)
Sodium: 137 mmol/L (ref 135–145)
Total Bilirubin: 0.9 mg/dL (ref 0.3–1.2)
Total Protein: 6.7 g/dL (ref 6.5–8.1)

## 2019-08-03 LAB — CBC WITH DIFFERENTIAL (CANCER CENTER ONLY)
Abs Immature Granulocytes: 0 10*3/uL (ref 0.00–0.07)
Basophils Absolute: 0 10*3/uL (ref 0.0–0.1)
Basophils Relative: 1 %
Eosinophils Absolute: 0.1 10*3/uL (ref 0.0–0.5)
Eosinophils Relative: 3 %
HCT: 37 % — ABNORMAL LOW (ref 39.0–52.0)
Hemoglobin: 13.4 g/dL (ref 13.0–17.0)
Immature Granulocytes: 0 %
Lymphocytes Relative: 33 %
Lymphs Abs: 0.9 10*3/uL (ref 0.7–4.0)
MCH: 36.5 pg — ABNORMAL HIGH (ref 26.0–34.0)
MCHC: 36.2 g/dL — ABNORMAL HIGH (ref 30.0–36.0)
MCV: 100.8 fL — ABNORMAL HIGH (ref 80.0–100.0)
Monocytes Absolute: 0.2 10*3/uL (ref 0.1–1.0)
Monocytes Relative: 8 %
Neutro Abs: 1.5 10*3/uL — ABNORMAL LOW (ref 1.7–7.7)
Neutrophils Relative %: 55 %
Platelet Count: 153 10*3/uL (ref 150–400)
RBC: 3.67 MIL/uL — ABNORMAL LOW (ref 4.22–5.81)
RDW: 12.7 % (ref 11.5–15.5)
WBC Count: 2.7 10*3/uL — ABNORMAL LOW (ref 4.0–10.5)
nRBC: 0 % (ref 0.0–0.2)

## 2019-08-03 MED ORDER — BORTEZOMIB CHEMO SQ INJECTION 3.5 MG (2.5MG/ML)
3.0000 mg | Freq: Once | INTRAMUSCULAR | Status: AC
  Administered 2019-08-03: 3 mg via SUBCUTANEOUS
  Filled 2019-08-03: qty 1.2

## 2019-08-03 NOTE — Patient Instructions (Signed)
[facility] Discharge Instructions for Patients Receiving Chemotherapy  Today you received the following chemotherapy agents Velcade To help prevent nausea and vomiting after your treatment, we encourage you to take your nausea medication as prescribed.   If you develop nausea and vomiting that is not controlled by your nausea medication, call the clinic.   BELOW ARE SYMPTOMS THAT SHOULD BE REPORTED IMMEDIATELY:  *FEVER GREATER THAN 100.5 F  *CHILLS WITH OR WITHOUT FEVER  NAUSEA AND VOMITING THAT IS NOT CONTROLLED WITH YOUR NAUSEA MEDICATION  *UNUSUAL SHORTNESS OF BREATH  *UNUSUAL BRUISING OR BLEEDING  TENDERNESS IN MOUTH AND THROAT WITH OR WITHOUT PRESENCE OF ULCERS  *URINARY PROBLEMS  *BOWEL PROBLEMS  UNUSUAL RASH Items with * indicate a potential emergency and should be followed up as soon as possible.  Feel free to call the clinic should you have any questions or concerns. The clinic phone number is [phone].  Please show the CHEMO ALERT CARD at check-in to the Emergency Department and triage nurse.   

## 2019-08-07 ENCOUNTER — Encounter: Payer: Self-pay | Admitting: Family

## 2019-08-10 NOTE — Progress Notes (Unsigned)
Pharmacist Chemotherapy Monitoring - Follow Up Assessment    I verify that I have reviewed each item in the below checklist:  . Regimen for the patient is scheduled for the appropriate day and plan matches scheduled date. Marland Kitchen Appropriate non-routine labs are ordered dependent on drug ordered. . If applicable, additional medications reviewed and ordered per protocol based on lifetime cumulative doses and/or treatment regimen.   Plan for follow-up and/or issues identified: No . I-vent associated with next due treatment: No . MD and/or nursing notified: No  Tyler Phillips, Tyler Phillips 08/10/2019 12:59 PM

## 2019-08-17 ENCOUNTER — Inpatient Hospital Stay: Payer: Medicare Other

## 2019-08-17 ENCOUNTER — Inpatient Hospital Stay: Payer: Medicare Other | Admitting: Family

## 2019-08-31 ENCOUNTER — Other Ambulatory Visit: Payer: Self-pay

## 2019-08-31 ENCOUNTER — Inpatient Hospital Stay (HOSPITAL_BASED_OUTPATIENT_CLINIC_OR_DEPARTMENT_OTHER): Payer: Medicare Other | Admitting: Family

## 2019-08-31 ENCOUNTER — Encounter: Payer: Self-pay | Admitting: Family

## 2019-08-31 ENCOUNTER — Inpatient Hospital Stay: Payer: Medicare Other | Attending: Hematology & Oncology

## 2019-08-31 ENCOUNTER — Inpatient Hospital Stay: Payer: Medicare Other

## 2019-08-31 VITALS — BP 136/91 | HR 69 | Temp 96.6°F | Resp 18 | Wt 217.0 lb

## 2019-08-31 VITALS — BP 136/91 | HR 69 | Temp 97.1°F | Resp 17 | Wt 217.0 lb

## 2019-08-31 DIAGNOSIS — C9000 Multiple myeloma not having achieved remission: Secondary | ICD-10-CM | POA: Diagnosis present

## 2019-08-31 DIAGNOSIS — C9001 Multiple myeloma in remission: Secondary | ICD-10-CM | POA: Diagnosis not present

## 2019-08-31 DIAGNOSIS — Z5112 Encounter for antineoplastic immunotherapy: Secondary | ICD-10-CM | POA: Diagnosis present

## 2019-08-31 LAB — CBC WITH DIFFERENTIAL (CANCER CENTER ONLY)
Abs Immature Granulocytes: 0 10*3/uL (ref 0.00–0.07)
Basophils Absolute: 0 10*3/uL (ref 0.0–0.1)
Basophils Relative: 1 %
Eosinophils Absolute: 0.1 10*3/uL (ref 0.0–0.5)
Eosinophils Relative: 3 %
HCT: 37.4 % — ABNORMAL LOW (ref 39.0–52.0)
Hemoglobin: 13.6 g/dL (ref 13.0–17.0)
Immature Granulocytes: 0 %
Lymphocytes Relative: 36 %
Lymphs Abs: 0.8 10*3/uL (ref 0.7–4.0)
MCH: 36.8 pg — ABNORMAL HIGH (ref 26.0–34.0)
MCHC: 36.4 g/dL — ABNORMAL HIGH (ref 30.0–36.0)
MCV: 101.1 fL — ABNORMAL HIGH (ref 80.0–100.0)
Monocytes Absolute: 0.2 10*3/uL (ref 0.1–1.0)
Monocytes Relative: 8 %
Neutro Abs: 1.2 10*3/uL — ABNORMAL LOW (ref 1.7–7.7)
Neutrophils Relative %: 52 %
Platelet Count: 136 10*3/uL — ABNORMAL LOW (ref 150–400)
RBC: 3.7 MIL/uL — ABNORMAL LOW (ref 4.22–5.81)
RDW: 12.8 % (ref 11.5–15.5)
WBC Count: 2.2 10*3/uL — ABNORMAL LOW (ref 4.0–10.5)
nRBC: 0 % (ref 0.0–0.2)

## 2019-08-31 LAB — CMP (CANCER CENTER ONLY)
ALT: 44 U/L (ref 0–44)
AST: 35 U/L (ref 15–41)
Albumin: 4.2 g/dL (ref 3.5–5.0)
Alkaline Phosphatase: 51 U/L (ref 38–126)
Anion gap: 7 (ref 5–15)
BUN: 14 mg/dL (ref 6–20)
CO2: 27 mmol/L (ref 22–32)
Calcium: 8.7 mg/dL — ABNORMAL LOW (ref 8.9–10.3)
Chloride: 106 mmol/L (ref 98–111)
Creatinine: 1.31 mg/dL — ABNORMAL HIGH (ref 0.61–1.24)
GFR, Est AFR Am: >60 mL/min (ref >60)
GFR, Estimated: >60 mL/min (ref >60)
Glucose, Bld: 86 mg/dL (ref 70–99)
Potassium: 3.8 mmol/L (ref 3.5–5.1)
Sodium: 140 mmol/L (ref 135–145)
Total Bilirubin: 0.6 mg/dL (ref 0.3–1.2)
Total Protein: 6.5 g/dL (ref 6.5–8.1)

## 2019-08-31 LAB — LACTATE DEHYDROGENASE: LDH: 164 U/L (ref 98–192)

## 2019-08-31 MED ORDER — BORTEZOMIB CHEMO SQ INJECTION 3.5 MG (2.5MG/ML)
3.0000 mg | Freq: Once | INTRAMUSCULAR | Status: AC
  Administered 2019-08-31: 3 mg via SUBCUTANEOUS
  Filled 2019-08-31: qty 1.2

## 2019-08-31 NOTE — Progress Notes (Signed)
Hematology and Oncology Follow Up Visit  Tyler Phillips [id number] [birth year] 54 y.o. 08/31/2019   Principle Diagnosis:  IgG Kappa myeloma - +4, +14, +17  Past Therapy: Status post autologous stem cell transplant on 05/22/2015 S/pcycle 6 of RVD Ninlaro 4 mg po q 2 week -- start on 01/02/2019 -- d/c on 02/10/2019  Current Therapy: Velcade q 2wk dosing/Revlimid 10mg  po q day (21/7)    Interim History:  Tyler Phillips is here today for follow-up and treatment. He is doing well but has had some issues with frequent diarrhea recently. He is unsure if this is related to his medication or not. He tried imodium which was effective after one dose. He has not had any episodes today but will let us know if it returns and becomes an issue for him.  No episodes of bleeding. No bruising or petechiae.  He is still staying active running and doing [employer].  April M-spike was not observed, IgG level 1,296 mg/dL and kappa light chains 4.34 mg/dL.  No fever, chills, n/v, cough, rash, dizziness, SOB, chest pain, palpitations, abdominal pain or changes in bowel or bladder habits.  No swelling or tenderness in his extremities.  The neuropathy in his feet is stable.  No falls or syncopal episodes to report.  He is eating well and hydrating properly. His weight is stable.   ECOG Performance Status: 1 - Symptomatic but completely ambulatory  Medications:  Allergies as of 08/31/2019   No Known Allergies     Medication List       Accurate as of Aug 31, 2019  8:41 AM. If you have any questions, ask your nurse or doctor.        aspirin 325 MG tablet Take 325 mg by mouth daily.   bortezomib IV 3.5 MG injection Commonly known as: VELCADE Inject as directed every 14 (fourteen) days.   cetirizine 10 MG tablet Commonly known as: ZYRTEC Take 10 mg by mouth daily.   lenalidomide 10 MG capsule Commonly known as: Revlimid TAKE 1 CAPSULE BY MOUTH  DAILY FOR 21 DAYS, THEN 7  DAYS  OFF-Auth#[id number]   LORazepam 1 MG tablet Commonly known as: ATIVAN Take 1 tablet (1 mg total) by mouth every 4 (four) hours as needed.   prochlorperazine 10 MG tablet Commonly known as: COMPAZINE Take 10 mg by mouth every 6 (six) hours as needed.   valACYclovir 500 MG tablet Commonly known as: VALTREX Take 500 mg by mouth 2 (two) times daily.   Vitamin D3 25 MCG (1000 UT) Caps Take by mouth.   zolpidem 10 MG tablet Commonly known as: AMBIEN Take 1 tablet (10 mg total) by mouth at bedtime as needed for sleep.       Allergies: No Known Allergies  Past Medical History, Surgical history, Social history, and Family History were reviewed and updated.  Review of Systems: All other 10 point review of systems is negative.   Physical Exam:  vitals were not taken for this visit.   Wt Readings from Last 3 Encounters:  07/20/19 215 lb (97.5 kg)  06/01/19 217 lb (98.4 kg)  05/04/19 215 lb 1.9 oz (97.6 kg)    Ocular: Sclerae unicteric, pupils equal, round and reactive to light Ear-nose-throat: Oropharynx clear, dentition fair Lymphatic: No cervical or supraclavicular adenopathy Lungs no rales or rhonchi, good excursion bilaterally Heart regular rate and rhythm, no murmur appreciated Abd soft, nontender, positive bowel sounds, no liver or spleen tip palpated on exam, no fluid wave  MSK no  focal spinal tenderness, no joint edema Neuro: non-focal, well-oriented, appropriate affect Breasts: Deferred   Lab Results  Component Value Date   WBC 2.2 (L) 08/31/2019   HGB 13.6 08/31/2019   HCT 37.4 (L) 08/31/2019   MCV 101.1 (H) 08/31/2019   PLT 136 (L) 08/31/2019   Lab Results  Component Value Date   FERRITIN 1,263 (H) 07/20/2014   IRON 125 07/20/2014   TIBC 198 (L) 07/20/2014   UIBC 73 (L) 07/20/2014   IRONPCTSAT 63 (H) 07/20/2014   Lab Results  Component Value Date   RETICCTPCT 0.8 07/20/2014   RBC 3.70 (L) 08/31/2019   Lab Results  Component Value Date    KPAFRELGTCHN 43.4 (H) 07/20/2019   LAMBDASER 29.1 (H) 07/20/2019   KAPLAMBRATIO 1.49 07/20/2019   Lab Results  Component Value Date   IGGSERUM 1,296 07/20/2019   IGA 197 07/20/2019   IGMSERUM 18 (L) 07/20/2019   Lab Results  Component Value Date   TOTALPROTELP 6.6 07/20/2019   ALBUMINELP 4.0 07/20/2019   A1GS 0.1 07/20/2019   A2GS 0.5 07/20/2019   BETS 0.8 07/20/2019   BETA2SER 0.3 03/28/2015   GAMS 1.1 07/20/2019   MSPIKE Not Observed 07/20/2019   SPEI Comment 07/20/2019     Chemistry      Component Value Date/Time   NA 137 08/03/2019 1103   NA 140 04/22/2017 1140   NA 138 09/03/2015 1042   K 3.9 08/03/2019 1103   K 4.0 04/22/2017 1140   K 4.3 09/03/2015 1042   CL 104 08/03/2019 1103   CL 105 04/22/2017 1140   CO2 26 08/03/2019 1103   CO2 24 04/22/2017 1140   CO2 25 09/03/2015 1042   BUN 14 08/03/2019 1103   BUN 16 04/22/2017 1140   BUN 21.7 09/03/2015 1042   CREATININE 1.27 (H) 08/03/2019 1103   CREATININE 1.6 (H) 04/22/2017 1140   CREATININE 1.2 09/03/2015 1042      Component Value Date/Time   CALCIUM 9.7 08/03/2019 1103   CALCIUM 8.5 04/22/2017 1140   CALCIUM 9.1 09/03/2015 1042   ALKPHOS 52 08/03/2019 1103   ALKPHOS 59 04/22/2017 1140   ALKPHOS 42 09/03/2015 1042   AST 31 08/03/2019 1103   AST 27 09/03/2015 1042   ALT 58 (H) 08/03/2019 1103   ALT 45 04/22/2017 1140   ALT 41 09/03/2015 1042   BILITOT 0.9 08/03/2019 1103   BILITOT 0.54 09/03/2015 1042       Impression and Plan: Tyler Phillips is a very pleasant 54 yo caucasian gentleman with IgG kappa myeloma. He underwent induction chemotherapy with RVD. He then underwent an autologous stem cell transplant at [facility] on February 2017.  He continues to do well on maintenance Revlimid and Velcade.  We will proceed with Velcade treatment today as planned.  He will continue treatment every 2 weeks and follow-up in 4 weeks.  He will contact our office with any questions or concerns. We can certainly see him  sooner if needed.   Laverna Peace, NP 5/14/20218:41 AM

## 2019-08-31 NOTE — Progress Notes (Signed)
Reviewed pt labs with Judson Roch, NP and pt ok to treat with ANC 1.2

## 2019-08-31 NOTE — Patient Instructions (Signed)
[facility] Discharge Instructions for Patients Receiving Chemotherapy  Today you received the following chemotherapy agents Velcade To help prevent nausea and vomiting after your treatment, we encourage you to take your nausea medication as prescribed.   If you develop nausea and vomiting that is not controlled by your nausea medication, call the clinic.   BELOW ARE SYMPTOMS THAT SHOULD BE REPORTED IMMEDIATELY:  *FEVER GREATER THAN 100.5 F  *CHILLS WITH OR WITHOUT FEVER  NAUSEA AND VOMITING THAT IS NOT CONTROLLED WITH YOUR NAUSEA MEDICATION  *UNUSUAL SHORTNESS OF BREATH  *UNUSUAL BRUISING OR BLEEDING  TENDERNESS IN MOUTH AND THROAT WITH OR WITHOUT PRESENCE OF ULCERS  *URINARY PROBLEMS  *BOWEL PROBLEMS  UNUSUAL RASH Items with * indicate a potential emergency and should be followed up as soon as possible.  Feel free to call the clinic should you have any questions or concerns. The clinic phone number is [phone].  Please show the CHEMO ALERT CARD at check-in to the Emergency Department and triage nurse.   

## 2019-09-01 LAB — IGG, IGA, IGM
IgA: 199 mg/dL (ref 90–386)
IgG (Immunoglobin G), Serum: 1330 mg/dL (ref 603–1613)
IgM (Immunoglobulin M), Srm: 18 mg/dL — ABNORMAL LOW (ref 20–172)

## 2019-09-03 LAB — PROTEIN ELECTROPHORESIS, SERUM
A/G Ratio: 1.3 (ref 0.7–1.7)
Albumin ELP: 3.8 g/dL (ref 2.9–4.4)
Alpha-1-Globulin: 0.2 g/dL (ref 0.0–0.4)
Alpha-2-Globulin: 0.6 g/dL (ref 0.4–1.0)
Beta Globulin: 0.9 g/dL (ref 0.7–1.3)
Gamma Globulin: 1.3 g/dL (ref 0.4–1.8)
Globulin, Total: 3 g/dL (ref 2.2–3.9)
Total Protein ELP: 6.8 g/dL (ref 6.0–8.5)

## 2019-09-03 LAB — KAPPA/LAMBDA LIGHT CHAINS
Kappa free light chain: 40.2 mg/L — ABNORMAL HIGH (ref 3.3–19.4)
Kappa, lambda light chain ratio: 1.45 (ref 0.26–1.65)
Lambda free light chains: 27.8 mg/L — ABNORMAL HIGH (ref 5.7–26.3)

## 2019-09-07 NOTE — Progress Notes (Signed)
Pharmacist Chemotherapy Monitoring - Follow Up Assessment    I verify that I have reviewed each item in the below checklist:  . Regimen for the patient is scheduled for the appropriate day and plan matches scheduled date. Marland Kitchen Appropriate non-routine labs are ordered dependent on drug ordered. . If applicable, additional medications reviewed and ordered per protocol based on lifetime cumulative doses and/or treatment regimen.   Plan for follow-up and/or issues identified: No . I-vent associated with next due treatment: No . MD and/or nursing notified: No  Tyler Phillips, Tyler Phillips 09/07/2019 1:00 PM

## 2019-09-11 ENCOUNTER — Other Ambulatory Visit: Payer: Self-pay | Admitting: Hematology & Oncology

## 2019-09-11 ENCOUNTER — Other Ambulatory Visit: Payer: Self-pay | Admitting: *Deleted

## 2019-09-11 DIAGNOSIS — C9002 Multiple myeloma in relapse: Secondary | ICD-10-CM

## 2019-09-11 MED ORDER — LENALIDOMIDE 10 MG PO CAPS: ORAL_CAPSULE | ORAL | 0 refills | Status: DC

## 2019-09-14 ENCOUNTER — Inpatient Hospital Stay: Payer: Medicare Other

## 2019-09-14 ENCOUNTER — Other Ambulatory Visit: Payer: Self-pay

## 2019-09-14 VITALS — BP 129/81 | HR 65 | Temp 97.5°F | Resp 17

## 2019-09-14 DIAGNOSIS — C9001 Multiple myeloma in remission: Secondary | ICD-10-CM

## 2019-09-14 DIAGNOSIS — C9000 Multiple myeloma not having achieved remission: Secondary | ICD-10-CM

## 2019-09-14 DIAGNOSIS — Z5112 Encounter for antineoplastic immunotherapy: Secondary | ICD-10-CM | POA: Diagnosis not present

## 2019-09-14 LAB — CBC WITH DIFFERENTIAL (CANCER CENTER ONLY)
Abs Immature Granulocytes: 0.01 10*3/uL (ref 0.00–0.07)
Basophils Absolute: 0 10*3/uL (ref 0.0–0.1)
Basophils Relative: 1 %
Eosinophils Absolute: 0.1 10*3/uL (ref 0.0–0.5)
Eosinophils Relative: 5 %
HCT: 36.1 % — ABNORMAL LOW (ref 39.0–52.0)
Hemoglobin: 13 g/dL (ref 13.0–17.0)
Immature Granulocytes: 0 %
Lymphocytes Relative: 36 %
Lymphs Abs: 0.9 10*3/uL (ref 0.7–4.0)
MCH: 36.4 pg — ABNORMAL HIGH (ref 26.0–34.0)
MCHC: 36 g/dL (ref 30.0–36.0)
MCV: 101.1 fL — ABNORMAL HIGH (ref 80.0–100.0)
Monocytes Absolute: 0.3 10*3/uL (ref 0.1–1.0)
Monocytes Relative: 10 %
Neutro Abs: 1.2 10*3/uL — ABNORMAL LOW (ref 1.7–7.7)
Neutrophils Relative %: 48 %
Platelet Count: 134 10*3/uL — ABNORMAL LOW (ref 150–400)
RBC: 3.57 MIL/uL — ABNORMAL LOW (ref 4.22–5.81)
RDW: 12.4 % (ref 11.5–15.5)
WBC Count: 2.5 10*3/uL — ABNORMAL LOW (ref 4.0–10.5)
nRBC: 0 % (ref 0.0–0.2)

## 2019-09-14 LAB — CMP (CANCER CENTER ONLY)
ALT: 50 U/L — ABNORMAL HIGH (ref 0–44)
AST: 28 U/L (ref 15–41)
Albumin: 4 g/dL (ref 3.5–5.0)
Alkaline Phosphatase: 54 U/L (ref 38–126)
Anion gap: 8 (ref 5–15)
BUN: 13 mg/dL (ref 6–20)
CO2: 25 mmol/L (ref 22–32)
Calcium: 9 mg/dL (ref 8.9–10.3)
Chloride: 106 mmol/L (ref 98–111)
Creatinine: 1.41 mg/dL — ABNORMAL HIGH (ref 0.61–1.24)
GFR, Est AFR Am: >60 mL/min (ref >60)
GFR, Estimated: 56 mL/min — ABNORMAL LOW (ref >60)
Glucose, Bld: 138 mg/dL — ABNORMAL HIGH (ref 70–99)
Potassium: 3.8 mmol/L (ref 3.5–5.1)
Sodium: 139 mmol/L (ref 135–145)
Total Bilirubin: 0.6 mg/dL (ref 0.3–1.2)
Total Protein: 6.9 g/dL (ref 6.5–8.1)

## 2019-09-14 MED ORDER — BORTEZOMIB CHEMO SQ INJECTION 3.5 MG (2.5MG/ML)
3.0000 mg | Freq: Once | INTRAMUSCULAR | Status: AC
  Administered 2019-09-14: 3 mg via SUBCUTANEOUS
  Filled 2019-09-14: qty 1.2

## 2019-09-14 NOTE — Progress Notes (Signed)
Reviewed pt labs with Geoffery Lyons, NP and pt ok to treat with ANC 1.2

## 2019-09-14 NOTE — Patient Instructions (Signed)
[facility] Discharge Instructions for Patients Receiving Chemotherapy  Today you received the following chemotherapy agents Velcade To help prevent nausea and vomiting after your treatment, we encourage you to take your nausea medication as prescribed.   If you develop nausea and vomiting that is not controlled by your nausea medication, call the clinic.   BELOW ARE SYMPTOMS THAT SHOULD BE REPORTED IMMEDIATELY:  *FEVER GREATER THAN 100.5 F  *CHILLS WITH OR WITHOUT FEVER  NAUSEA AND VOMITING THAT IS NOT CONTROLLED WITH YOUR NAUSEA MEDICATION  *UNUSUAL SHORTNESS OF BREATH  *UNUSUAL BRUISING OR BLEEDING  TENDERNESS IN MOUTH AND THROAT WITH OR WITHOUT PRESENCE OF ULCERS  *URINARY PROBLEMS  *BOWEL PROBLEMS  UNUSUAL RASH Items with * indicate a potential emergency and should be followed up as soon as possible.  Feel free to call the clinic should you have any questions or concerns. The clinic phone number is [phone].  Please show the CHEMO ALERT CARD at check-in to the Emergency Department and triage nurse.   

## 2019-09-28 ENCOUNTER — Inpatient Hospital Stay: Payer: Medicare Other

## 2019-09-28 ENCOUNTER — Other Ambulatory Visit: Payer: Self-pay

## 2019-09-28 ENCOUNTER — Inpatient Hospital Stay: Payer: Medicare Other | Attending: Hematology & Oncology

## 2019-09-28 VITALS — BP 144/73 | HR 65 | Temp 97.8°F | Resp 18

## 2019-09-28 DIAGNOSIS — Z5112 Encounter for antineoplastic immunotherapy: Secondary | ICD-10-CM | POA: Diagnosis not present

## 2019-09-28 DIAGNOSIS — C9001 Multiple myeloma in remission: Secondary | ICD-10-CM

## 2019-09-28 DIAGNOSIS — C9000 Multiple myeloma not having achieved remission: Secondary | ICD-10-CM | POA: Insufficient documentation

## 2019-09-28 LAB — CBC WITH DIFFERENTIAL (CANCER CENTER ONLY)
Abs Immature Granulocytes: 0.01 10*3/uL (ref 0.00–0.07)
Basophils Absolute: 0 10*3/uL (ref 0.0–0.1)
Basophils Relative: 1 %
Eosinophils Absolute: 0.1 10*3/uL (ref 0.0–0.5)
Eosinophils Relative: 2 %
HCT: 37.7 % — ABNORMAL LOW (ref 39.0–52.0)
Hemoglobin: 13.6 g/dL (ref 13.0–17.0)
Immature Granulocytes: 0 %
Lymphocytes Relative: 27 %
Lymphs Abs: 0.7 10*3/uL (ref 0.7–4.0)
MCH: 36.8 pg — ABNORMAL HIGH (ref 26.0–34.0)
MCHC: 36.1 g/dL — ABNORMAL HIGH (ref 30.0–36.0)
MCV: 101.9 fL — ABNORMAL HIGH (ref 80.0–100.0)
Monocytes Absolute: 0.2 10*3/uL (ref 0.1–1.0)
Monocytes Relative: 7 %
Neutro Abs: 1.7 10*3/uL (ref 1.7–7.7)
Neutrophils Relative %: 63 %
Platelet Count: 152 10*3/uL (ref 150–400)
RBC: 3.7 MIL/uL — ABNORMAL LOW (ref 4.22–5.81)
RDW: 12.7 % (ref 11.5–15.5)
WBC Count: 2.7 10*3/uL — ABNORMAL LOW (ref 4.0–10.5)
nRBC: 0 % (ref 0.0–0.2)

## 2019-09-28 LAB — CMP (CANCER CENTER ONLY)
ALT: 46 U/L — ABNORMAL HIGH (ref 0–44)
AST: 24 U/L (ref 15–41)
Albumin: 4.4 g/dL (ref 3.5–5.0)
Alkaline Phosphatase: 55 U/L (ref 38–126)
Anion gap: 8 (ref 5–15)
BUN: 13 mg/dL (ref 6–20)
CO2: 23 mmol/L (ref 22–32)
Calcium: 8.8 mg/dL — ABNORMAL LOW (ref 8.9–10.3)
Chloride: 105 mmol/L (ref 98–111)
Creatinine: 1.38 mg/dL — ABNORMAL HIGH (ref 0.61–1.24)
GFR, Est AFR Am: >60 mL/min (ref >60)
GFR, Estimated: 58 mL/min — ABNORMAL LOW (ref >60)
Glucose, Bld: 144 mg/dL — ABNORMAL HIGH (ref 70–99)
Potassium: 4.1 mmol/L (ref 3.5–5.1)
Sodium: 136 mmol/L (ref 135–145)
Total Bilirubin: 0.6 mg/dL (ref 0.3–1.2)
Total Protein: 7 g/dL (ref 6.5–8.1)

## 2019-09-28 MED ORDER — BORTEZOMIB CHEMO SQ INJECTION 3.5 MG (2.5MG/ML)
3.0000 mg | Freq: Once | INTRAMUSCULAR | Status: AC
  Administered 2019-09-28: 3 mg via SUBCUTANEOUS
  Filled 2019-09-28: qty 1.2

## 2019-09-28 NOTE — Patient Instructions (Signed)
[facility] Discharge Instructions for Patients Receiving Chemotherapy  Today you received the following chemotherapy agents Velcade To help prevent nausea and vomiting after your treatment, we encourage you to take your nausea medication as prescribed.   If you develop nausea and vomiting that is not controlled by your nausea medication, call the clinic.   BELOW ARE SYMPTOMS THAT SHOULD BE REPORTED IMMEDIATELY:  *FEVER GREATER THAN 100.5 F  *CHILLS WITH OR WITHOUT FEVER  NAUSEA AND VOMITING THAT IS NOT CONTROLLED WITH YOUR NAUSEA MEDICATION  *UNUSUAL SHORTNESS OF BREATH  *UNUSUAL BRUISING OR BLEEDING  TENDERNESS IN MOUTH AND THROAT WITH OR WITHOUT PRESENCE OF ULCERS  *URINARY PROBLEMS  *BOWEL PROBLEMS  UNUSUAL RASH Items with * indicate a potential emergency and should be followed up as soon as possible.  Feel free to call the clinic should you have any questions or concerns. The clinic phone number is [phone].  Please show the CHEMO ALERT CARD at check-in to the Emergency Department and triage nurse.   

## 2019-10-05 ENCOUNTER — Other Ambulatory Visit: Payer: Self-pay | Admitting: *Deleted

## 2019-10-05 DIAGNOSIS — C9002 Multiple myeloma in relapse: Secondary | ICD-10-CM

## 2019-10-05 MED ORDER — LENALIDOMIDE 10 MG PO CAPS: ORAL_CAPSULE | ORAL | 0 refills | Status: DC

## 2019-10-12 ENCOUNTER — Inpatient Hospital Stay: Payer: Medicare Other

## 2019-10-12 ENCOUNTER — Other Ambulatory Visit: Payer: Self-pay

## 2019-10-12 ENCOUNTER — Inpatient Hospital Stay (HOSPITAL_BASED_OUTPATIENT_CLINIC_OR_DEPARTMENT_OTHER): Payer: Medicare Other | Admitting: Hematology & Oncology

## 2019-10-12 ENCOUNTER — Encounter: Payer: Self-pay | Admitting: Hematology & Oncology

## 2019-10-12 VITALS — BP 137/86 | HR 52 | Temp 96.6°F | Resp 18 | Wt 218.0 lb

## 2019-10-12 DIAGNOSIS — C9001 Multiple myeloma in remission: Secondary | ICD-10-CM | POA: Diagnosis not present

## 2019-10-12 DIAGNOSIS — Z5112 Encounter for antineoplastic immunotherapy: Secondary | ICD-10-CM | POA: Diagnosis not present

## 2019-10-12 LAB — CMP (CANCER CENTER ONLY)
ALT: 57 U/L — ABNORMAL HIGH (ref 0–44)
AST: 27 U/L (ref 15–41)
Albumin: 4.3 g/dL (ref 3.5–5.0)
Alkaline Phosphatase: 52 U/L (ref 38–126)
Anion gap: 4 — ABNORMAL LOW (ref 5–15)
BUN: 16 mg/dL (ref 6–20)
CO2: 29 mmol/L (ref 22–32)
Calcium: 8.8 mg/dL — ABNORMAL LOW (ref 8.9–10.3)
Chloride: 104 mmol/L (ref 98–111)
Creatinine: 1.27 mg/dL — ABNORMAL HIGH (ref 0.61–1.24)
GFR, Est AFR Am: >60 mL/min (ref >60)
GFR, Estimated: >60 mL/min (ref >60)
Glucose, Bld: 93 mg/dL (ref 70–99)
Potassium: 4.5 mmol/L (ref 3.5–5.1)
Sodium: 137 mmol/L (ref 135–145)
Total Bilirubin: 0.7 mg/dL (ref 0.3–1.2)
Total Protein: 7.1 g/dL (ref 6.5–8.1)

## 2019-10-12 LAB — CBC WITH DIFFERENTIAL (CANCER CENTER ONLY)
Abs Immature Granulocytes: 0.01 10*3/uL (ref 0.00–0.07)
Basophils Absolute: 0 10*3/uL (ref 0.0–0.1)
Basophils Relative: 0 %
Eosinophils Absolute: 0.1 10*3/uL (ref 0.0–0.5)
Eosinophils Relative: 4 %
HCT: 37.2 % — ABNORMAL LOW (ref 39.0–52.0)
Hemoglobin: 13 g/dL (ref 13.0–17.0)
Immature Granulocytes: 0 %
Lymphocytes Relative: 33 %
Lymphs Abs: 0.8 10*3/uL (ref 0.7–4.0)
MCH: 36.1 pg — ABNORMAL HIGH (ref 26.0–34.0)
MCHC: 34.9 g/dL (ref 30.0–36.0)
MCV: 103.3 fL — ABNORMAL HIGH (ref 80.0–100.0)
Monocytes Absolute: 0.3 10*3/uL (ref 0.1–1.0)
Monocytes Relative: 12 %
Neutro Abs: 1.2 10*3/uL — ABNORMAL LOW (ref 1.7–7.7)
Neutrophils Relative %: 51 %
Platelet Count: 130 10*3/uL — ABNORMAL LOW (ref 150–400)
RBC: 3.6 MIL/uL — ABNORMAL LOW (ref 4.22–5.81)
RDW: 13 % (ref 11.5–15.5)
WBC Count: 2.3 10*3/uL — ABNORMAL LOW (ref 4.0–10.5)
nRBC: 0 % (ref 0.0–0.2)

## 2019-10-12 LAB — LACTATE DEHYDROGENASE: LDH: 170 U/L (ref 98–192)

## 2019-10-12 MED ORDER — BORTEZOMIB CHEMO SQ INJECTION 3.5 MG (2.5MG/ML)
3.0000 mg | Freq: Once | INTRAMUSCULAR | Status: AC
  Administered 2019-10-12: 3 mg via SUBCUTANEOUS
  Filled 2019-10-12: qty 1.2

## 2019-10-12 NOTE — Patient Instructions (Signed)
[facility] Discharge Instructions for Patients Receiving Chemotherapy  Today you received the following chemotherapy agents Velcade To help prevent nausea and vomiting after your treatment, we encourage you to take your nausea medication as prescribed.   If you develop nausea and vomiting that is not controlled by your nausea medication, call the clinic.   BELOW ARE SYMPTOMS THAT SHOULD BE REPORTED IMMEDIATELY:  *FEVER GREATER THAN 100.5 F  *CHILLS WITH OR WITHOUT FEVER  NAUSEA AND VOMITING THAT IS NOT CONTROLLED WITH YOUR NAUSEA MEDICATION  *UNUSUAL SHORTNESS OF BREATH  *UNUSUAL BRUISING OR BLEEDING  TENDERNESS IN MOUTH AND THROAT WITH OR WITHOUT PRESENCE OF ULCERS  *URINARY PROBLEMS  *BOWEL PROBLEMS  UNUSUAL RASH Items with * indicate a potential emergency and should be followed up as soon as possible.  Feel free to call the clinic should you have any questions or concerns. The clinic phone number is [phone].  Please show the CHEMO ALERT CARD at check-in to the Emergency Department and triage nurse.   

## 2019-10-12 NOTE — Progress Notes (Signed)
Hematology and Oncology Follow Up Visit  Tyler Phillips [id number] 1965/07/21 54 y.o. 10/12/2019   Principle Diagnosis:  IgG Kappa myeloma - +4, +14, +17  Past Therapy: Status post autologous stem cell transplant on 05/22/2015 S/pcycle 6 of RVD Ninlaro 4 mg po q 2 week -- start on 01/02/2019 -- d/c on 02/10/2019  Current Therapy: Velcade q 2wk dosing/Revlimid 10mg  po q day (21/7)    Interim History:  [name] is here today for follow-up and treatment.  As expected, he has been incredibly active.  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  He has been busy down here.  He is now retired.  He is still quite active.  He does lot of work on [employer] that they have.  He is incredibly tan.  I told him to make sure he uses sunscreen appropriately.  With his last myeloma studies, there is no monoclonal spike in his blood.  His IgG level was 1330 mg/dL.  His kappa light chain was 4 mg/dL.  He has had no problems with the Revlimid.  He may have some loose stool.  He has had no fever.  He has had no cough or shortness of breath.  He has had no problems with rashes.  He has avoided the coronavirus.  He has been very diligent with avoiding this.  Overall, his performance status is ECOG 0.    Medications:  Allergies as of 10/12/2019   No Known Allergies     Medication List       Accurate as of October 12, 2019 11:27 AM. If you have any questions, ask your nurse or doctor.        aspirin 325 MG tablet Take 325 mg by mouth daily.   bortezomib IV 3.5 MG injection Commonly known as: VELCADE Inject as directed every 14 (fourteen) days.   cetirizine 10 MG tablet Commonly known as: ZYRTEC Take 10 mg by mouth daily.   lenalidomide 10 MG capsule Commonly known as: Revlimid TAKE 1 CAPSULE BY MOUTH  DAILY FOR 21 DAYS ON, THEN  7 DAYS OFF.  Authorization # [id number]   LORazepam 1 MG tablet Commonly known as:  ATIVAN Take 1 tablet (1 mg total) by mouth every 4 (four) hours as needed.   prochlorperazine 10 MG tablet Commonly known as: COMPAZINE Take 10 mg by mouth every 6 (six) hours as needed.   valACYclovir 500 MG tablet Commonly known as: VALTREX Take 500 mg by mouth 2 (two) times daily.   Vitamin D3 25 MCG (1000 UT) Caps Take by mouth.   zolpidem 10 MG tablet Commonly known as: AMBIEN Take 1 tablet (10 mg total) by mouth at bedtime as needed for sleep.       Allergies: No Known Allergies  Past Medical History, Surgical history, Social history, and Family History were reviewed and updated.  Review of Systems: Review of Systems  Constitutional: Negative.   HENT: Negative.   Eyes: Negative.   Respiratory: Negative.   Cardiovascular: Negative.   Gastrointestinal: Negative.   Genitourinary: Negative.   Musculoskeletal: Negative.   Skin: Negative.   Neurological: Negative.   Endo/Heme/Allergies: Negative.   Psychiatric/Behavioral: Negative.      Physical Exam:  weight is 218 lb (98.9 kg). His temporal temperature is 96.6 F (35.9 C) (abnormal). His blood pressure is 137/86 and his pulse is 52 (abnormal). His respiration is 18 and oxygen saturation  is 99%.   Wt Readings from Last 3 Encounters:  10/12/19 218 lb (98.9 kg)  08/31/19 217 lb (98.4 kg)  08/31/19 217 lb (98.4 kg)     Physical Activity:   . Days of Exercise per Week:   . Minutes of Exercise per Session:    Physical Exam Vitals reviewed.  HENT:     Head: Normocephalic and atraumatic.  Eyes:     Pupils: Pupils are equal, round, and reactive to light.  Cardiovascular:     Rate and Rhythm: Normal rate and regular rhythm.     Heart sounds: Normal heart sounds.  Pulmonary:     Effort: Pulmonary effort is normal.     Breath sounds: Normal breath sounds.  Abdominal:     General: Bowel sounds are normal.     Palpations: Abdomen is soft.  Musculoskeletal:        General: No tenderness or deformity. Normal  range of motion.     Cervical back: Normal range of motion.  Lymphadenopathy:     Cervical: No cervical adenopathy.  Skin:    General: Skin is warm and dry.     Findings: No erythema or rash.  Neurological:     Mental Status: He is alert and oriented to person, place, and time.  Psychiatric:        Behavior: Behavior normal.        Thought Content: Thought content normal.        Judgment: Judgment normal.       Lab Results  Component Value Date   WBC 2.3 (L) 10/12/2019   HGB 13.0 10/12/2019   HCT 37.2 (L) 10/12/2019   MCV 103.3 (H) 10/12/2019   PLT 130 (L) 10/12/2019   Lab Results  Component Value Date   FERRITIN 1,263 (H) 07/20/2014   IRON 125 07/20/2014   TIBC 198 (L) 07/20/2014   UIBC 73 (L) 07/20/2014   IRONPCTSAT 63 (H) 07/20/2014   Lab Results  Component Value Date   RETICCTPCT 0.8 07/20/2014   RBC 3.60 (L) 10/12/2019   Lab Results  Component Value Date   KPAFRELGTCHN 40.2 (H) 08/31/2019   LAMBDASER 27.8 (H) 08/31/2019   KAPLAMBRATIO 1.45 08/31/2019   Lab Results  Component Value Date   IGGSERUM 1,330 08/31/2019   IGA 199 08/31/2019   IGMSERUM 18 (L) 08/31/2019   Lab Results  Component Value Date   TOTALPROTELP 6.8 08/31/2019   ALBUMINELP 3.8 08/31/2019   A1GS 0.2 08/31/2019   A2GS 0.6 08/31/2019   BETS 0.9 08/31/2019   BETA2SER 0.3 03/28/2015   GAMS 1.3 08/31/2019   MSPIKE Not Observed 08/31/2019   SPEI Comment 08/31/2019     Chemistry      Component Value Date/Time   NA 137 10/12/2019 1029   NA 140 04/22/2017 1140   NA 138 09/03/2015 1042   K 4.5 10/12/2019 1029   K 4.0 04/22/2017 1140   K 4.3 09/03/2015 1042   CL 104 10/12/2019 1029   CL 105 04/22/2017 1140   CO2 29 10/12/2019 1029   CO2 24 04/22/2017 1140   CO2 25 09/03/2015 1042   BUN 16 10/12/2019 1029   BUN 16 04/22/2017 1140   BUN 21.7 09/03/2015 1042   CREATININE 1.27 (H) 10/12/2019 1029   CREATININE 1.6 (H) 04/22/2017 1140   CREATININE 1.2 09/03/2015 1042        Component Value Date/Time   CALCIUM 8.8 (L) 10/12/2019 1029   CALCIUM 8.5 04/22/2017 1140   CALCIUM 9.1 09/03/2015  1042   ALKPHOS 52 10/12/2019 1029   ALKPHOS 59 04/22/2017 1140   ALKPHOS 42 09/03/2015 1042   AST 27 10/12/2019 1029   AST 27 09/03/2015 1042   ALT 57 (H) 10/12/2019 1029   ALT 45 04/22/2017 1140   ALT 41 09/03/2015 1042   BILITOT 0.7 10/12/2019 1029   BILITOT 0.54 09/03/2015 1042       Impression and Plan: [name] is a very pleasant 54 yo caucasian gentleman with IgG kappa myeloma. He underwent induction chemotherapy with RVD. He then underwent an autologous stem cell transplant at [facility] on February 2017.   He continues to do well on maintenance Revlimid and Velcade.  So far, there is been no toxicity.  I talked him about maybe spreading out the Velcade every 3 weeks.  He would prefer to to go every 2 weeks for right now.  We will continue the every 2-week protocol and then see him back in 6 more weeks.    [name], MD 6/25/202111:27 AM

## 2019-10-14 LAB — IGG, IGA, IGM
IgA: 210 mg/dL (ref 90–386)
IgG (Immunoglobin G), Serum: 1300 mg/dL (ref 603–1613)
IgM (Immunoglobulin M), Srm: 18 mg/dL — ABNORMAL LOW (ref 20–172)

## 2019-10-15 LAB — PROTEIN ELECTROPHORESIS, SERUM
A/G Ratio: 1.3 (ref 0.7–1.7)
Albumin ELP: 3.9 g/dL (ref 2.9–4.4)
Alpha-1-Globulin: 0.2 g/dL (ref 0.0–0.4)
Alpha-2-Globulin: 0.6 g/dL (ref 0.4–1.0)
Beta Globulin: 0.9 g/dL (ref 0.7–1.3)
Gamma Globulin: 1.3 g/dL (ref 0.4–1.8)
Globulin, Total: 3 g/dL (ref 2.2–3.9)
Total Protein ELP: 6.9 g/dL (ref 6.0–8.5)

## 2019-10-15 LAB — KAPPA/LAMBDA LIGHT CHAINS
Kappa free light chain: 42.5 mg/L — ABNORMAL HIGH (ref 3.3–19.4)
Kappa, lambda light chain ratio: 1.5 (ref 0.26–1.65)
Lambda free light chains: 28.4 mg/L — ABNORMAL HIGH (ref 5.7–26.3)

## 2019-10-26 ENCOUNTER — Inpatient Hospital Stay: Payer: Medicare Other

## 2019-10-26 ENCOUNTER — Other Ambulatory Visit: Payer: Self-pay

## 2019-10-26 ENCOUNTER — Inpatient Hospital Stay: Payer: Medicare Other | Attending: Hematology & Oncology

## 2019-10-26 VITALS — BP 136/95 | HR 54 | Temp 98.5°F | Resp 18

## 2019-10-26 DIAGNOSIS — C9001 Multiple myeloma in remission: Secondary | ICD-10-CM

## 2019-10-26 DIAGNOSIS — Z5112 Encounter for antineoplastic immunotherapy: Secondary | ICD-10-CM | POA: Insufficient documentation

## 2019-10-26 DIAGNOSIS — C9000 Multiple myeloma not having achieved remission: Secondary | ICD-10-CM | POA: Insufficient documentation

## 2019-10-26 LAB — CBC WITH DIFFERENTIAL (CANCER CENTER ONLY)
Abs Immature Granulocytes: 0 10*3/uL (ref 0.00–0.07)
Basophils Absolute: 0 10*3/uL (ref 0.0–0.1)
Basophils Relative: 1 %
Eosinophils Absolute: 0.1 10*3/uL (ref 0.0–0.5)
Eosinophils Relative: 2 %
HCT: 37.6 % — ABNORMAL LOW (ref 39.0–52.0)
Hemoglobin: 13.4 g/dL (ref 13.0–17.0)
Immature Granulocytes: 0 %
Lymphocytes Relative: 32 %
Lymphs Abs: 0.9 10*3/uL (ref 0.7–4.0)
MCH: 36.1 pg — ABNORMAL HIGH (ref 26.0–34.0)
MCHC: 35.6 g/dL (ref 30.0–36.0)
MCV: 101.3 fL — ABNORMAL HIGH (ref 80.0–100.0)
Monocytes Absolute: 0.2 10*3/uL (ref 0.1–1.0)
Monocytes Relative: 7 %
Neutro Abs: 1.8 10*3/uL (ref 1.7–7.7)
Neutrophils Relative %: 58 %
Platelet Count: 153 10*3/uL (ref 150–400)
RBC: 3.71 MIL/uL — ABNORMAL LOW (ref 4.22–5.81)
RDW: 12.9 % (ref 11.5–15.5)
WBC Count: 3 10*3/uL — ABNORMAL LOW (ref 4.0–10.5)
nRBC: 0 % (ref 0.0–0.2)

## 2019-10-26 LAB — CMP (CANCER CENTER ONLY)
ALT: 58 U/L — ABNORMAL HIGH (ref 0–44)
AST: 30 U/L (ref 15–41)
Albumin: 4.5 g/dL (ref 3.5–5.0)
Alkaline Phosphatase: 55 U/L (ref 38–126)
Anion gap: 7 (ref 5–15)
BUN: 16 mg/dL (ref 6–20)
CO2: 24 mmol/L (ref 22–32)
Calcium: 9.1 mg/dL (ref 8.9–10.3)
Chloride: 106 mmol/L (ref 98–111)
Creatinine: 1.32 mg/dL — ABNORMAL HIGH (ref 0.61–1.24)
GFR, Est AFR Am: >60 mL/min (ref >60)
GFR, Estimated: >60 mL/min (ref >60)
Glucose, Bld: 88 mg/dL (ref 70–99)
Potassium: 4.2 mmol/L (ref 3.5–5.1)
Sodium: 137 mmol/L (ref 135–145)
Total Bilirubin: 0.7 mg/dL (ref 0.3–1.2)
Total Protein: 7.2 g/dL (ref 6.5–8.1)

## 2019-10-26 MED ORDER — BORTEZOMIB CHEMO SQ INJECTION 3.5 MG (2.5MG/ML)
3.0000 mg | Freq: Once | INTRAMUSCULAR | Status: AC
  Administered 2019-10-26: 3 mg via SUBCUTANEOUS
  Filled 2019-10-26: qty 1.2

## 2019-10-26 NOTE — Patient Instructions (Signed)
Bortezomib injection What is this medicine? BORTEZOMIB (bor TEZ oh mib) is a medicine that targets proteins in cancer cells and stops the cancer cells from growing. It is used to treat multiple myeloma and mantle-cell lymphoma. This medicine may be used for other purposes; ask your health care provider or pharmacist if you have questions. COMMON BRAND NAME(S): Velcade What should I tell my health care provider before I take this medicine? They need to know if you have any of these conditions:  diabetes  heart disease  irregular heartbeat  liver disease  on hemodialysis  low blood counts, like low white blood cells, platelets, or hemoglobin  peripheral neuropathy  taking medicine for blood pressure  an unusual or allergic reaction to bortezomib, mannitol, boron, other medicines, foods, dyes, or preservatives  pregnant or trying to get pregnant  breast-feeding How should I use this medicine? This medicine is for injection into a vein or for injection under the skin. It is given by a health care professional in a hospital or clinic setting. Talk to your pediatrician regarding the use of this medicine in children. Special care may be needed. Overdosage: If you think you have taken too much of this medicine contact a poison control center or emergency room at once. NOTE: This medicine is only for you. Do not share this medicine with others. What if I miss a dose? It is important not to miss your dose. Call your doctor or health care professional if you are unable to keep an appointment. What may interact with this medicine? This medicine may interact with the following medications:  ketoconazole  rifampin  ritonavir  St. John's Wort This list may not describe all possible interactions. Give your health care provider a list of all the medicines, herbs, non-prescription drugs, or dietary supplements you use. Also tell them if you smoke, drink alcohol, or use illegal drugs. Some  items may interact with your medicine. What should I watch for while using this medicine? You may get drowsy or dizzy. Do not drive, use machinery, or do anything that needs mental alertness until you know how this medicine affects you. Do not stand or sit up quickly, especially if you are an older patient. This reduces the risk of dizzy or fainting spells. In some cases, you may be given additional medicines to help with side effects. Follow all directions for their use. Call your doctor or health care professional for advice if you get a fever, chills or sore throat, or other symptoms of a cold or flu. Do not treat yourself. This drug decreases your body's ability to fight infections. Try to avoid being around people who are sick. This medicine may increase your risk to bruise or bleed. Call your doctor or health care professional if you notice any unusual bleeding. You may need blood work done while you are taking this medicine. In some patients, this medicine may cause a serious brain infection that may cause death. If you have any problems seeing, thinking, speaking, walking, or standing, tell your doctor right away. If you cannot reach your doctor, urgently seek other source of medical care. Check with your doctor or health care professional if you get an attack of severe diarrhea, nausea and vomiting, or if you sweat a lot. The loss of too much body fluid can make it dangerous for you to take this medicine. Do not become pregnant while taking this medicine or for at least 7 months after stopping it. Women should inform their doctor   if they wish to become pregnant or think they might be pregnant. Men should not father a child while taking this medicine and for at least 4 months after stopping it. There is a potential for serious side effects to an unborn child. Talk to your health care professional or pharmacist for more information. Do not breast-feed an infant while taking this medicine or for 2  months after stopping it. This medicine may interfere with the ability to have a child. You should talk with your doctor or health care professional if you are concerned about your fertility. What side effects may I notice from receiving this medicine? Side effects that you should report to your doctor or health care professional as soon as possible:  allergic reactions like skin rash, itching or hives, swelling of the face, lips, or tongue  breathing problems  changes in hearing  changes in vision  fast, irregular heartbeat  feeling faint or lightheaded, falls  pain, tingling, numbness in the hands or feet  right upper belly pain  seizures  swelling of the ankles, feet, hands  unusual bleeding or bruising  unusually weak or tired  vomiting  yellowing of the eyes or skin Side effects that usually do not require medical attention (report to your doctor or health care professional if they continue or are bothersome):  changes in emotions or moods  constipation  diarrhea  loss of appetite  headache  irritation at site where injected  nausea This list may not describe all possible side effects. Call your doctor for medical advice about side effects. You may report side effects to FDA at 1-800-FDA-1088. Where should I keep my medicine? This drug is given in a hospital or clinic and will not be stored at home. NOTE: This sheet is a summary. It may not cover all possible information. If you have questions about this medicine, talk to your doctor, pharmacist, or health care provider.  2020 Elsevier/Gold Standard (2017-08-15 16:29:31)  

## 2019-10-30 ENCOUNTER — Other Ambulatory Visit: Payer: Self-pay | Admitting: *Deleted

## 2019-10-30 DIAGNOSIS — C9002 Multiple myeloma in relapse: Secondary | ICD-10-CM

## 2019-10-30 MED ORDER — LENALIDOMIDE 10 MG PO CAPS: ORAL_CAPSULE | ORAL | 0 refills | Status: DC

## 2019-11-09 ENCOUNTER — Other Ambulatory Visit: Payer: Self-pay

## 2019-11-09 ENCOUNTER — Telehealth: Payer: Self-pay | Admitting: Hematology & Oncology

## 2019-11-09 ENCOUNTER — Inpatient Hospital Stay (HOSPITAL_BASED_OUTPATIENT_CLINIC_OR_DEPARTMENT_OTHER): Payer: Medicare Other | Admitting: Hematology & Oncology

## 2019-11-09 ENCOUNTER — Inpatient Hospital Stay: Payer: Medicare Other

## 2019-11-09 ENCOUNTER — Encounter: Payer: Self-pay | Admitting: Hematology & Oncology

## 2019-11-09 VITALS — BP 134/94 | HR 60 | Temp 98.6°F | Resp 20 | Wt 214.0 lb

## 2019-11-09 VITALS — BP 130/82

## 2019-11-09 DIAGNOSIS — C9001 Multiple myeloma in remission: Secondary | ICD-10-CM | POA: Diagnosis not present

## 2019-11-09 DIAGNOSIS — Z5112 Encounter for antineoplastic immunotherapy: Secondary | ICD-10-CM | POA: Diagnosis not present

## 2019-11-09 LAB — CBC WITH DIFFERENTIAL (CANCER CENTER ONLY)
Abs Immature Granulocytes: 0 10*3/uL (ref 0.00–0.07)
Basophils Absolute: 0 10*3/uL (ref 0.0–0.1)
Basophils Relative: 0 %
Eosinophils Absolute: 0.1 10*3/uL (ref 0.0–0.5)
Eosinophils Relative: 4 %
HCT: 36 % — ABNORMAL LOW (ref 39.0–52.0)
Hemoglobin: 13 g/dL (ref 13.0–17.0)
Immature Granulocytes: 0 %
Lymphocytes Relative: 34 %
Lymphs Abs: 0.9 10*3/uL (ref 0.7–4.0)
MCH: 36.2 pg — ABNORMAL HIGH (ref 26.0–34.0)
MCHC: 36.1 g/dL — ABNORMAL HIGH (ref 30.0–36.0)
MCV: 100.3 fL — ABNORMAL HIGH (ref 80.0–100.0)
Monocytes Absolute: 0.3 10*3/uL (ref 0.1–1.0)
Monocytes Relative: 11 %
Neutro Abs: 1.3 10*3/uL — ABNORMAL LOW (ref 1.7–7.7)
Neutrophils Relative %: 51 %
Platelet Count: 129 10*3/uL — ABNORMAL LOW (ref 150–400)
RBC: 3.59 MIL/uL — ABNORMAL LOW (ref 4.22–5.81)
RDW: 12.8 % (ref 11.5–15.5)
WBC Count: 2.6 10*3/uL — ABNORMAL LOW (ref 4.0–10.5)
nRBC: 0 % (ref 0.0–0.2)

## 2019-11-09 LAB — CMP (CANCER CENTER ONLY)
ALT: 58 U/L — ABNORMAL HIGH (ref 0–44)
AST: 31 U/L (ref 15–41)
Albumin: 4.6 g/dL (ref 3.5–5.0)
Alkaline Phosphatase: 56 U/L (ref 38–126)
Anion gap: 7 (ref 5–15)
BUN: 18 mg/dL (ref 6–20)
CO2: 26 mmol/L (ref 22–32)
Calcium: 9.6 mg/dL (ref 8.9–10.3)
Chloride: 104 mmol/L (ref 98–111)
Creatinine: 1.37 mg/dL — ABNORMAL HIGH (ref 0.61–1.24)
GFR, Est AFR Am: >60 mL/min (ref >60)
GFR, Estimated: 58 mL/min — ABNORMAL LOW (ref >60)
Glucose, Bld: 90 mg/dL (ref 70–99)
Potassium: 4.2 mmol/L (ref 3.5–5.1)
Sodium: 137 mmol/L (ref 135–145)
Total Bilirubin: 0.7 mg/dL (ref 0.3–1.2)
Total Protein: 7.3 g/dL (ref 6.5–8.1)

## 2019-11-09 MED ORDER — BORTEZOMIB CHEMO SQ INJECTION 3.5 MG (2.5MG/ML)
3.0000 mg | Freq: Once | INTRAMUSCULAR | Status: AC
  Administered 2019-11-09: 3 mg via SUBCUTANEOUS
  Filled 2019-11-09: qty 1.2

## 2019-11-09 NOTE — Telephone Encounter (Signed)
Appointments scheduled patient will ger updates from My Chart per 7/23 los

## 2019-11-09 NOTE — Progress Notes (Signed)
Hematology and Oncology Follow Up Visit  Tyler Phillips [id number] May 25, 1965 54 y.o. 11/09/2019   Principle Diagnosis:  IgG Kappa myeloma - +4, +14, +17  Past Therapy: Status post autologous stem cell transplant on 05/22/2015 S/pcycle 6 of RVD Ninlaro 4 mg po q 2 week -- start on 01/02/2019 -- d/c on 02/10/2019  Current Therapy: Velcade q 2wk dosing/Revlimid 10mg  po q day (21/7)    Interim History:  [name] is here today for follow-up and treatment.  As expected, he has been incredibly active.  He is about to take a bike ride on his motorcycle up to [place] and then go across [place] to [place].  He will be in a mile swim in the ocean.  This is going to be in a couple weeks.  He will take the back roads on his motorcycle.  He has been doing well overall.  He has been staying quite active.  He is retired but yet he probably is busier now than when he was working.  Thankfully his myeloma has been doing okay.  He is little worried about his light chains.  However, his light chains have been relatively stable when looking at their ratio.  I am not worried about the slightly elevated kappa light chain because the lambda light chains are also elevated.  There is no monoclonal spike in his blood.  He has had no fever.  He has had no nausea or vomiting.  He has had no cough.  There is been no rashes.  He has had no change in bowel or bladder habits.  Overall, his performance status is ECOG 0.   Medications:  Allergies as of 11/09/2019   No Known Allergies     Medication List       Accurate as of November 09, 2019 10:52 AM. If you have any questions, ask your nurse or doctor.        STOP taking these medications   Vitamin D3 25 MCG (1000 UT) Caps Stopped by: [name], MD     TAKE these medications   aspirin 325 MG tablet Take 325 mg by mouth daily.   bortezomib IV 3.5 MG injection Commonly known as: VELCADE Inject as directed every 14 (fourteen)  days.   cetirizine 10 MG tablet Commonly known as: ZYRTEC Take 10 mg by mouth daily.   lenalidomide 10 MG capsule Commonly known as: Revlimid TAKE 1 CAPSULE BY MOUTH  DAILY FOR 21 DAYS ON, THEN  7 DAYS OFF.  Authorization # [id number]   LORazepam 1 MG tablet Commonly known as: ATIVAN Take 1 tablet (1 mg total) by mouth every 4 (four) hours as needed.   prochlorperazine 10 MG tablet Commonly known as: COMPAZINE Take 10 mg by mouth every 6 (six) hours as needed.   valACYclovir 500 MG tablet Commonly known as: VALTREX Take 500 mg by mouth 2 (two) times daily.   zolpidem 10 MG tablet Commonly known as: AMBIEN Take 1 tablet (10 mg total) by mouth at bedtime as needed for sleep.       Allergies: No Known Allergies  Past Medical History, Surgical history, Social history, and Family History were reviewed and updated.  Review of Systems: Review of Systems  Constitutional: Negative.   HENT: Negative.   Eyes: Negative.   Respiratory: Negative.   Cardiovascular: Negative.   Gastrointestinal: Negative.   Genitourinary: Negative.   Musculoskeletal: Negative.   Skin: Negative.   Neurological: Negative.   Endo/Heme/Allergies: Negative.   Psychiatric/Behavioral: Negative.  Physical Exam:  weight is 214 lb (97.1 kg) (abnormal). His oral temperature is 98.6 F (37 C). His blood pressure is 134/94 (abnormal) and his pulse is 60. His respiration is 20 and oxygen saturation is 100%.   Wt Readings from Last 3 Encounters:  11/09/19 (!) 214 lb (97.1 kg)  10/12/19 218 lb (98.9 kg)  08/31/19 217 lb (98.4 kg)     Physical Activity:   . Days of Exercise per Week:   . Minutes of Exercise per Session:    Physical Exam Vitals reviewed.  HENT:     Head: Normocephalic and atraumatic.  Eyes:     Pupils: Pupils are equal, round, and reactive to light.  Cardiovascular:     Rate and Rhythm: Normal rate and regular rhythm.     Heart sounds: Normal heart sounds.  Pulmonary:      Effort: Pulmonary effort is normal.     Breath sounds: Normal breath sounds.  Abdominal:     General: Bowel sounds are normal.     Palpations: Abdomen is soft.  Musculoskeletal:        General: No tenderness or deformity. Normal range of motion.     Cervical back: Normal range of motion.  Lymphadenopathy:     Cervical: No cervical adenopathy.  Skin:    General: Skin is warm and dry.     Findings: No erythema or rash.  Neurological:     Mental Status: He is alert and oriented to person, place, and time.  Psychiatric:        Behavior: Behavior normal.        Thought Content: Thought content normal.        Judgment: Judgment normal.       Lab Results  Component Value Date   WBC 2.6 (L) 11/09/2019   HGB 13.0 11/09/2019   HCT 36.0 (L) 11/09/2019   MCV 100.3 (H) 11/09/2019   PLT 129 (L) 11/09/2019   Lab Results  Component Value Date   FERRITIN 1,263 (H) 07/20/2014   IRON 125 07/20/2014   TIBC 198 (L) 07/20/2014   UIBC 73 (L) 07/20/2014   IRONPCTSAT 63 (H) 07/20/2014   Lab Results  Component Value Date   RETICCTPCT 0.8 07/20/2014   RBC 3.59 (L) 11/09/2019   Lab Results  Component Value Date   KPAFRELGTCHN 42.5 (H) 10/12/2019   LAMBDASER 28.4 (H) 10/12/2019   KAPLAMBRATIO 1.50 10/12/2019   Lab Results  Component Value Date   IGGSERUM 1,300 10/12/2019   IGA 210 10/12/2019   IGMSERUM 18 (L) 10/12/2019   Lab Results  Component Value Date   TOTALPROTELP 6.9 10/12/2019   ALBUMINELP 3.9 10/12/2019   A1GS 0.2 10/12/2019   A2GS 0.6 10/12/2019   BETS 0.9 10/12/2019   BETA2SER 0.3 03/28/2015   GAMS 1.3 10/12/2019   MSPIKE Not Observed 10/12/2019   SPEI Comment 10/12/2019     Chemistry      Component Value Date/Time   NA 137 11/09/2019 0946   NA 140 04/22/2017 1140   NA 138 09/03/2015 1042   K 4.2 11/09/2019 0946   K 4.0 04/22/2017 1140   K 4.3 09/03/2015 1042   CL 104 11/09/2019 0946   CL 105 04/22/2017 1140   CO2 26 11/09/2019 0946   CO2 24 04/22/2017  1140   CO2 25 09/03/2015 1042   BUN 18 11/09/2019 0946   BUN 16 04/22/2017 1140   BUN 21.7 09/03/2015 1042   CREATININE 1.37 (H) 11/09/2019 0946   CREATININE  1.6 (H) 04/22/2017 1140   CREATININE 1.2 09/03/2015 1042      Component Value Date/Time   CALCIUM 9.6 11/09/2019 0946   CALCIUM 8.5 04/22/2017 1140   CALCIUM 9.1 09/03/2015 1042   ALKPHOS 56 11/09/2019 0946   ALKPHOS 59 04/22/2017 1140   ALKPHOS 42 09/03/2015 1042   AST 31 11/09/2019 0946   AST 27 09/03/2015 1042   ALT 58 (H) 11/09/2019 0946   ALT 45 04/22/2017 1140   ALT 41 09/03/2015 1042   BILITOT 0.7 11/09/2019 0946   BILITOT 0.54 09/03/2015 1042       Impression and Plan: [name] is a very pleasant 54 yo caucasian gentleman with IgG kappa myeloma. He underwent induction chemotherapy with RVD. He then underwent an autologous stem cell transplant at [facility] on February 2017.   He continues to do well on maintenance Revlimid and Velcade.  So far, there is been no toxicity.  I still feel that he is responding nicely.  We will still plan for every 2 weeks.  We may have to make an adjustment according to his schedule with respect to traveling.  I did repeat his blood pressure.  I used a manual cuff.  His blood pressure when I took it was 130/82.     [name], MD 7/23/202110:52 AM

## 2019-11-09 NOTE — Patient Instructions (Signed)
[facility] Discharge Instructions for Patients Receiving Chemotherapy  Today you received the following chemotherapy agents Velcade To help prevent nausea and vomiting after your treatment, we encourage you to take your nausea medication as prescribed.   If you develop nausea and vomiting that is not controlled by your nausea medication, call the clinic.   BELOW ARE SYMPTOMS THAT SHOULD BE REPORTED IMMEDIATELY:  *FEVER GREATER THAN 100.5 F  *CHILLS WITH OR WITHOUT FEVER  NAUSEA AND VOMITING THAT IS NOT CONTROLLED WITH YOUR NAUSEA MEDICATION  *UNUSUAL SHORTNESS OF BREATH  *UNUSUAL BRUISING OR BLEEDING  TENDERNESS IN MOUTH AND THROAT WITH OR WITHOUT PRESENCE OF ULCERS  *URINARY PROBLEMS  *BOWEL PROBLEMS  UNUSUAL RASH Items with * indicate a potential emergency and should be followed up as soon as possible.  Feel free to call the clinic should you have any questions or concerns. The clinic phone number is [phone].  Please show the CHEMO ALERT CARD at check-in to the Emergency Department and triage nurse.   

## 2019-11-10 LAB — IGG, IGA, IGM
IgA: 215 mg/dL (ref 90–386)
IgG (Immunoglobin G), Serum: 1307 mg/dL (ref 603–1613)
IgM (Immunoglobulin M), Srm: 18 mg/dL — ABNORMAL LOW (ref 20–172)

## 2019-11-12 LAB — PROTEIN ELECTROPHORESIS, SERUM, WITH REFLEX
A/G Ratio: 1.3 (ref 0.7–1.7)
Albumin ELP: 3.9 g/dL (ref 2.9–4.4)
Alpha-1-Globulin: 0.1 g/dL (ref 0.0–0.4)
Alpha-2-Globulin: 0.6 g/dL (ref 0.4–1.0)
Beta Globulin: 1 g/dL (ref 0.7–1.3)
Gamma Globulin: 1.4 g/dL (ref 0.4–1.8)
Globulin, Total: 3.1 g/dL (ref 2.2–3.9)
Total Protein ELP: 7 g/dL (ref 6.0–8.5)

## 2019-11-12 LAB — KAPPA/LAMBDA LIGHT CHAINS
Kappa free light chain: 46.8 mg/L — ABNORMAL HIGH (ref 3.3–19.4)
Kappa, lambda light chain ratio: 1.6 (ref 0.26–1.65)
Lambda free light chains: 29.3 mg/L — ABNORMAL HIGH (ref 5.7–26.3)

## 2019-11-23 ENCOUNTER — Inpatient Hospital Stay: Payer: Medicare Other

## 2019-11-27 ENCOUNTER — Other Ambulatory Visit: Payer: Self-pay | Admitting: *Deleted

## 2019-11-27 DIAGNOSIS — C9002 Multiple myeloma in relapse: Secondary | ICD-10-CM

## 2019-11-27 MED ORDER — LENALIDOMIDE 10 MG PO CAPS: ORAL_CAPSULE | ORAL | 0 refills | Status: DC

## 2019-11-30 ENCOUNTER — Inpatient Hospital Stay (HOSPITAL_BASED_OUTPATIENT_CLINIC_OR_DEPARTMENT_OTHER): Payer: Medicare Other | Admitting: Hematology & Oncology

## 2019-11-30 ENCOUNTER — Other Ambulatory Visit: Payer: Self-pay

## 2019-11-30 ENCOUNTER — Inpatient Hospital Stay: Payer: Medicare Other | Attending: Hematology & Oncology

## 2019-11-30 ENCOUNTER — Encounter: Payer: Self-pay | Admitting: Hematology & Oncology

## 2019-11-30 ENCOUNTER — Inpatient Hospital Stay: Payer: Medicare Other

## 2019-11-30 VITALS — BP 144/68 | HR 57 | Temp 98.6°F | Resp 18

## 2019-11-30 DIAGNOSIS — Z5112 Encounter for antineoplastic immunotherapy: Secondary | ICD-10-CM | POA: Insufficient documentation

## 2019-11-30 DIAGNOSIS — C9001 Multiple myeloma in remission: Secondary | ICD-10-CM

## 2019-11-30 DIAGNOSIS — C9000 Multiple myeloma not having achieved remission: Secondary | ICD-10-CM | POA: Insufficient documentation

## 2019-11-30 LAB — CBC WITH DIFFERENTIAL (CANCER CENTER ONLY)
Abs Immature Granulocytes: 0.01 10*3/uL (ref 0.00–0.07)
Basophils Absolute: 0 10*3/uL (ref 0.0–0.1)
Basophils Relative: 1 %
Eosinophils Absolute: 0.1 10*3/uL (ref 0.0–0.5)
Eosinophils Relative: 3 %
HCT: 37.1 % — ABNORMAL LOW (ref 39.0–52.0)
Hemoglobin: 13.4 g/dL (ref 13.0–17.0)
Immature Granulocytes: 0 %
Lymphocytes Relative: 33 %
Lymphs Abs: 1 10*3/uL (ref 0.7–4.0)
MCH: 36.6 pg — ABNORMAL HIGH (ref 26.0–34.0)
MCHC: 36.1 g/dL — ABNORMAL HIGH (ref 30.0–36.0)
MCV: 101.4 fL — ABNORMAL HIGH (ref 80.0–100.0)
Monocytes Absolute: 0.3 10*3/uL (ref 0.1–1.0)
Monocytes Relative: 10 %
Neutro Abs: 1.6 10*3/uL — ABNORMAL LOW (ref 1.7–7.7)
Neutrophils Relative %: 53 %
Platelet Count: 138 10*3/uL — ABNORMAL LOW (ref 150–400)
RBC: 3.66 MIL/uL — ABNORMAL LOW (ref 4.22–5.81)
RDW: 12.6 % (ref 11.5–15.5)
WBC Count: 3 10*3/uL — ABNORMAL LOW (ref 4.0–10.5)
nRBC: 0 % (ref 0.0–0.2)

## 2019-11-30 LAB — CMP (CANCER CENTER ONLY)
ALT: 45 U/L — ABNORMAL HIGH (ref 0–44)
AST: 23 U/L (ref 15–41)
Albumin: 4.4 g/dL (ref 3.5–5.0)
Alkaline Phosphatase: 56 U/L (ref 38–126)
Anion gap: 7 (ref 5–15)
BUN: 18 mg/dL (ref 6–20)
CO2: 24 mmol/L (ref 22–32)
Calcium: 9 mg/dL (ref 8.9–10.3)
Chloride: 105 mmol/L (ref 98–111)
Creatinine: 1.42 mg/dL — ABNORMAL HIGH (ref 0.61–1.24)
GFR, Est AFR Am: >60 mL/min (ref >60)
GFR, Estimated: 55 mL/min — ABNORMAL LOW (ref >60)
Glucose, Bld: 107 mg/dL — ABNORMAL HIGH (ref 70–99)
Potassium: 4.1 mmol/L (ref 3.5–5.1)
Sodium: 136 mmol/L (ref 135–145)
Total Bilirubin: 0.7 mg/dL (ref 0.3–1.2)
Total Protein: 6.9 g/dL (ref 6.5–8.1)

## 2019-11-30 MED ORDER — BORTEZOMIB CHEMO SQ INJECTION 3.5 MG (2.5MG/ML)
3.0000 mg | Freq: Once | INTRAMUSCULAR | Status: AC
  Administered 2019-11-30: 3 mg via SUBCUTANEOUS
  Filled 2019-11-30: qty 1.2

## 2019-11-30 NOTE — Progress Notes (Signed)
Hematology and Oncology Follow Up Visit  Tyler Phillips [id number] 1965-08-16 54 y.o. 11/30/2019   Principle Diagnosis:  IgG Kappa myeloma - +4, +14, +17  Past Therapy: Status post autologous stem cell transplant on 05/22/2015 S/pcycle 6 of RVD Ninlaro 4 mg po q 2 week -- start on 01/02/2019 -- d/c on 02/10/2019  Current Therapy: Velcade q 2wk dosing/Revlimid 10mg  po q day (21/7)    Interim History:  [name] is here today for in unscheduled visit. I want to talk to him about his myeloma studies. Even though there is no monoclonal spike in his blood, I am worried that the kappa light chains are slowly going up. The trend has been increasing. However, this is been slow and steady.  He feels great. He has had no problems. He has had no issues with fatigue or weakness. He has had no fever. There is been no cough or shortness of breath. Is had no change in bowel or bladder habits.   Overall, his performance status is ECOG 0.   Medications:  Allergies as of 11/30/2019   No Known Allergies     Medication List       Accurate as of November 30, 2019 11:57 AM. If you have any questions, ask your nurse or doctor.        aspirin 325 MG tablet Take 325 mg by mouth daily.   bortezomib IV 3.5 MG injection Commonly known as: VELCADE Inject as directed every 14 (fourteen) days.   cetirizine 10 MG tablet Commonly known as: ZYRTEC Take 10 mg by mouth daily.   lenalidomide 10 MG capsule Commonly known as: Revlimid TAKE 1 CAPSULE BY MOUTH  DAILY FOR 21 DAYS ON, THEN  7 DAYS OFF.  Authorization # [id number]   LORazepam 1 MG tablet Commonly known as: ATIVAN Take 1 tablet (1 mg total) by mouth every 4 (four) hours as needed.   prochlorperazine 10 MG tablet Commonly known as: COMPAZINE Take 10 mg by mouth every 6 (six) hours as needed.   valACYclovir 500 MG tablet Commonly known as: VALTREX Take 500 mg by mouth 2 (two) times daily.   zolpidem 10 MG tablet Commonly known  as: AMBIEN Take 1 tablet (10 mg total) by mouth at bedtime as needed for sleep.       Allergies: No Known Allergies  Past Medical History, Surgical history, Social history, and Family History were reviewed and updated.  Review of Systems: Review of Systems  Constitutional: Negative.   HENT: Negative.   Eyes: Negative.   Respiratory: Negative.   Cardiovascular: Negative.   Gastrointestinal: Negative.   Genitourinary: Negative.   Musculoskeletal: Negative.   Skin: Negative.   Neurological: Negative.   Endo/Heme/Allergies: Negative.   Psychiatric/Behavioral: Negative.      Physical Exam:  vitals were not taken for this visit.   Wt Readings from Last 3 Encounters:  11/09/19 (!) 214 lb (97.1 kg)  10/12/19 218 lb (98.9 kg)  08/31/19 217 lb (98.4 kg)     Physical Activity:   . Days of Exercise per Week:   . Minutes of Exercise per Session:    Physical Exam Vitals reviewed.  HENT:     Head: Normocephalic and atraumatic.  Eyes:     Pupils: Pupils are equal, round, and reactive to light.  Cardiovascular:     Rate and Rhythm: Normal rate and regular rhythm.     Heart sounds: Normal heart sounds.  Pulmonary:     Effort: Pulmonary effort is normal.  Breath sounds: Normal breath sounds.  Abdominal:     General: Bowel sounds are normal.     Palpations: Abdomen is soft.  Musculoskeletal:        General: No tenderness or deformity. Normal range of motion.     Cervical back: Normal range of motion.  Lymphadenopathy:     Cervical: No cervical adenopathy.  Skin:    General: Skin is warm and dry.     Findings: No erythema or rash.  Neurological:     Mental Status: He is alert and oriented to person, place, and time.  Psychiatric:        Behavior: Behavior normal.        Thought Content: Thought content normal.        Judgment: Judgment normal.       Lab Results  Component Value Date   WBC 3.0 (L) 11/30/2019   HGB 13.4 11/30/2019   HCT 37.1 (L) 11/30/2019    MCV 101.4 (H) 11/30/2019   PLT 138 (L) 11/30/2019   Lab Results  Component Value Date   FERRITIN 1,263 (H) 07/20/2014   IRON 125 07/20/2014   TIBC 198 (L) 07/20/2014   UIBC 73 (L) 07/20/2014   IRONPCTSAT 63 (H) 07/20/2014   Lab Results  Component Value Date   RETICCTPCT 0.8 07/20/2014   RBC 3.66 (L) 11/30/2019   Lab Results  Component Value Date   KPAFRELGTCHN 46.8 (H) 11/09/2019   LAMBDASER 29.3 (H) 11/09/2019   KAPLAMBRATIO 1.60 11/09/2019   Lab Results  Component Value Date   IGGSERUM 1,307 11/09/2019   IGA 215 11/09/2019   IGMSERUM 18 (L) 11/09/2019   Lab Results  Component Value Date   TOTALPROTELP 7.0 11/09/2019   ALBUMINELP 3.9 11/09/2019   A1GS 0.1 11/09/2019   A2GS 0.6 11/09/2019   BETS 1.0 11/09/2019   BETA2SER 0.3 03/28/2015   GAMS 1.4 11/09/2019   MSPIKE Not Observed 11/09/2019   SPEI Comment 10/12/2019     Chemistry      Component Value Date/Time   NA 136 11/30/2019 0955   NA 140 04/22/2017 1140   NA 138 09/03/2015 1042   K 4.1 11/30/2019 0955   K 4.0 04/22/2017 1140   K 4.3 09/03/2015 1042   CL 105 11/30/2019 0955   CL 105 04/22/2017 1140   CO2 24 11/30/2019 0955   CO2 24 04/22/2017 1140   CO2 25 09/03/2015 1042   BUN 18 11/30/2019 0955   BUN 16 04/22/2017 1140   BUN 21.7 09/03/2015 1042   CREATININE 1.42 (H) 11/30/2019 0955   CREATININE 1.6 (H) 04/22/2017 1140   CREATININE 1.2 09/03/2015 1042      Component Value Date/Time   CALCIUM 9.0 11/30/2019 0955   CALCIUM 8.5 04/22/2017 1140   CALCIUM 9.1 09/03/2015 1042   ALKPHOS 56 11/30/2019 0955   ALKPHOS 59 04/22/2017 1140   ALKPHOS 42 09/03/2015 1042   AST 23 11/30/2019 0955   AST 27 09/03/2015 1042   ALT 45 (H) 11/30/2019 0955   ALT 45 04/22/2017 1140   ALT 41 09/03/2015 1042   BILITOT 0.7 11/30/2019 0955   BILITOT 0.54 09/03/2015 1042       Impression and Plan: [name] is a very pleasant 54 yo caucasian gentleman with IgG kappa myeloma. He underwent induction chemotherapy  with RVD. He then underwent an autologous stem cell transplant at [facility] on February 2017.   For right now, we will just watch his light chains monthly. I told him that if  we still find that the light chains are trending upward, we will have to make a change and given the fact that he is young and very healthy, think about a second transplant or possibly even one of the new CAR-T therapies.  He certainly is in agreement with this. He really had a miserable time with the transplant that he had 4 years ago. He would like to avoid another transplant if possible.      [name], MD 8/13/202111:57 AM

## 2019-11-30 NOTE — Patient Instructions (Signed)
Bortezomib injection What is this medicine? BORTEZOMIB (bor TEZ oh mib) is a medicine that targets proteins in cancer cells and stops the cancer cells from growing. It is used to treat multiple myeloma and mantle-cell lymphoma. This medicine may be used for other purposes; ask your health care provider or pharmacist if you have questions. COMMON BRAND NAME(S): Velcade What should I tell my health care provider before I take this medicine? They need to know if you have any of these conditions:  diabetes  heart disease  irregular heartbeat  liver disease  on hemodialysis  low blood counts, like low white blood cells, platelets, or hemoglobin  peripheral neuropathy  taking medicine for blood pressure  an unusual or allergic reaction to bortezomib, mannitol, boron, other medicines, foods, dyes, or preservatives  pregnant or trying to get pregnant  breast-feeding How should I use this medicine? This medicine is for injection into a vein or for injection under the skin. It is given by a health care professional in a hospital or clinic setting. Talk to your pediatrician regarding the use of this medicine in children. Special care may be needed. Overdosage: If you think you have taken too much of this medicine contact a poison control center or emergency room at once. NOTE: This medicine is only for you. Do not share this medicine with others. What if I miss a dose? It is important not to miss your dose. Call your doctor or health care professional if you are unable to keep an appointment. What may interact with this medicine? This medicine may interact with the following medications:  ketoconazole  rifampin  ritonavir  St. John's Wort This list may not describe all possible interactions. Give your health care provider a list of all the medicines, herbs, non-prescription drugs, or dietary supplements you use. Also tell them if you smoke, drink alcohol, or use illegal drugs. Some  items may interact with your medicine. What should I watch for while using this medicine? You may get drowsy or dizzy. Do not drive, use machinery, or do anything that needs mental alertness until you know how this medicine affects you. Do not stand or sit up quickly, especially if you are an older patient. This reduces the risk of dizzy or fainting spells. In some cases, you may be given additional medicines to help with side effects. Follow all directions for their use. Call your doctor or health care professional for advice if you get a fever, chills or sore throat, or other symptoms of a cold or flu. Do not treat yourself. This drug decreases your body's ability to fight infections. Try to avoid being around people who are sick. This medicine may increase your risk to bruise or bleed. Call your doctor or health care professional if you notice any unusual bleeding. You may need blood work done while you are taking this medicine. In some patients, this medicine may cause a serious brain infection that may cause death. If you have any problems seeing, thinking, speaking, walking, or standing, tell your doctor right away. If you cannot reach your doctor, urgently seek other source of medical care. Check with your doctor or health care professional if you get an attack of severe diarrhea, nausea and vomiting, or if you sweat a lot. The loss of too much body fluid can make it dangerous for you to take this medicine. Do not become pregnant while taking this medicine or for at least 7 months after stopping it. Women should inform their doctor  if they wish to become pregnant or think they might be pregnant. Men should not father a child while taking this medicine and for at least 4 months after stopping it. There is a potential for serious side effects to an unborn child. Talk to your health care professional or pharmacist for more information. Do not breast-feed an infant while taking this medicine or for 2  months after stopping it. This medicine may interfere with the ability to have a child. You should talk with your doctor or health care professional if you are concerned about your fertility. What side effects may I notice from receiving this medicine? Side effects that you should report to your doctor or health care professional as soon as possible:  allergic reactions like skin rash, itching or hives, swelling of the face, lips, or tongue  breathing problems  changes in hearing  changes in vision  fast, irregular heartbeat  feeling faint or lightheaded, falls  pain, tingling, numbness in the hands or feet  right upper belly pain  seizures  swelling of the ankles, feet, hands  unusual bleeding or bruising  unusually weak or tired  vomiting  yellowing of the eyes or skin Side effects that usually do not require medical attention (report to your doctor or health care professional if they continue or are bothersome):  changes in emotions or moods  constipation  diarrhea  loss of appetite  headache  irritation at site where injected  nausea This list may not describe all possible side effects. Call your doctor for medical advice about side effects. You may report side effects to FDA at 1-800-FDA-1088. Where should I keep my medicine? This drug is given in a hospital or clinic and will not be stored at home. NOTE: This sheet is a summary. It may not cover all possible information. If you have questions about this medicine, talk to your doctor, pharmacist, or health care provider.  2020 Elsevier/Gold Standard (2017-08-15 16:29:31)

## 2019-12-07 ENCOUNTER — Other Ambulatory Visit: Payer: Medicare Other

## 2019-12-07 ENCOUNTER — Inpatient Hospital Stay: Payer: Medicare Other

## 2019-12-14 ENCOUNTER — Inpatient Hospital Stay: Payer: Medicare Other

## 2019-12-14 ENCOUNTER — Other Ambulatory Visit: Payer: Self-pay

## 2019-12-14 VITALS — BP 133/89 | HR 62 | Temp 98.9°F | Resp 17

## 2019-12-14 DIAGNOSIS — C9000 Multiple myeloma not having achieved remission: Secondary | ICD-10-CM

## 2019-12-14 DIAGNOSIS — C9001 Multiple myeloma in remission: Secondary | ICD-10-CM

## 2019-12-14 DIAGNOSIS — Z5112 Encounter for antineoplastic immunotherapy: Secondary | ICD-10-CM | POA: Diagnosis not present

## 2019-12-14 LAB — CBC WITH DIFFERENTIAL (CANCER CENTER ONLY)
Abs Immature Granulocytes: 0 10*3/uL (ref 0.00–0.07)
Basophils Absolute: 0 10*3/uL (ref 0.0–0.1)
Basophils Relative: 1 %
Eosinophils Absolute: 0 10*3/uL (ref 0.0–0.5)
Eosinophils Relative: 0 %
HCT: 36.3 % — ABNORMAL LOW (ref 39.0–52.0)
Hemoglobin: 13.1 g/dL (ref 13.0–17.0)
Immature Granulocytes: 0 %
Lymphocytes Relative: 41 %
Lymphs Abs: 1 10*3/uL (ref 0.7–4.0)
MCH: 36.9 pg — ABNORMAL HIGH (ref 26.0–34.0)
MCHC: 36.1 g/dL — ABNORMAL HIGH (ref 30.0–36.0)
MCV: 102.3 fL — ABNORMAL HIGH (ref 80.0–100.0)
Monocytes Absolute: 0.2 10*3/uL (ref 0.1–1.0)
Monocytes Relative: 10 %
Neutro Abs: 1.2 10*3/uL — ABNORMAL LOW (ref 1.7–7.7)
Neutrophils Relative %: 48 %
Platelet Count: 135 10*3/uL — ABNORMAL LOW (ref 150–400)
RBC: 3.55 MIL/uL — ABNORMAL LOW (ref 4.22–5.81)
RDW: 13 % (ref 11.5–15.5)
WBC Count: 2.5 10*3/uL — ABNORMAL LOW (ref 4.0–10.5)
nRBC: 0 % (ref 0.0–0.2)

## 2019-12-14 LAB — CMP (CANCER CENTER ONLY)
ALT: 40 U/L (ref 0–44)
AST: 24 U/L (ref 15–41)
Albumin: 4.3 g/dL (ref 3.5–5.0)
Alkaline Phosphatase: 49 U/L (ref 38–126)
Anion gap: 6 (ref 5–15)
BUN: 14 mg/dL (ref 6–20)
CO2: 25 mmol/L (ref 22–32)
Calcium: 8.8 mg/dL — ABNORMAL LOW (ref 8.9–10.3)
Chloride: 106 mmol/L (ref 98–111)
Creatinine: 1.3 mg/dL — ABNORMAL HIGH (ref 0.61–1.24)
GFR, Est AFR Am: >60 mL/min (ref >60)
GFR, Estimated: >60 mL/min (ref >60)
Glucose, Bld: 123 mg/dL — ABNORMAL HIGH (ref 70–99)
Potassium: 4.2 mmol/L (ref 3.5–5.1)
Sodium: 137 mmol/L (ref 135–145)
Total Bilirubin: 0.7 mg/dL (ref 0.3–1.2)
Total Protein: 6.9 g/dL (ref 6.5–8.1)

## 2019-12-14 MED ORDER — BORTEZOMIB CHEMO SQ INJECTION 3.5 MG (2.5MG/ML)
3.0000 mg | Freq: Once | INTRAMUSCULAR | Status: AC
  Administered 2019-12-14: 3 mg via SUBCUTANEOUS
  Filled 2019-12-14: qty 1.2

## 2019-12-14 NOTE — Progress Notes (Signed)
Ok to treat with ANC of 1.2 per Dr Ennever. dph 

## 2019-12-14 NOTE — Patient Instructions (Signed)
Bortezomib injection What is this medicine? BORTEZOMIB (bor TEZ oh mib) is a medicine that targets proteins in cancer cells and stops the cancer cells from growing. It is used to treat multiple myeloma and mantle-cell lymphoma. This medicine may be used for other purposes; ask your health care provider or pharmacist if you have questions. COMMON BRAND NAME(S): Velcade What should I tell my health care provider before I take this medicine? They need to know if you have any of these conditions:  diabetes  heart disease  irregular heartbeat  liver disease  on hemodialysis  low blood counts, like low white blood cells, platelets, or hemoglobin  peripheral neuropathy  taking medicine for blood pressure  an unusual or allergic reaction to bortezomib, mannitol, boron, other medicines, foods, dyes, or preservatives  pregnant or trying to get pregnant  breast-feeding How should I use this medicine? This medicine is for injection into a vein or for injection under the skin. It is given by a health care professional in a hospital or clinic setting. Talk to your pediatrician regarding the use of this medicine in children. Special care may be needed. Overdosage: If you think you have taken too much of this medicine contact a poison control center or emergency room at once. NOTE: This medicine is only for you. Do not share this medicine with others. What if I miss a dose? It is important not to miss your dose. Call your doctor or health care professional if you are unable to keep an appointment. What may interact with this medicine? This medicine may interact with the following medications:  ketoconazole  rifampin  ritonavir  St. John's Wort This list may not describe all possible interactions. Give your health care provider a list of all the medicines, herbs, non-prescription drugs, or dietary supplements you use. Also tell them if you smoke, drink alcohol, or use illegal drugs. Some  items may interact with your medicine. What should I watch for while using this medicine? You may get drowsy or dizzy. Do not drive, use machinery, or do anything that needs mental alertness until you know how this medicine affects you. Do not stand or sit up quickly, especially if you are an older patient. This reduces the risk of dizzy or fainting spells. In some cases, you may be given additional medicines to help with side effects. Follow all directions for their use. Call your doctor or health care professional for advice if you get a fever, chills or sore throat, or other symptoms of a cold or flu. Do not treat yourself. This drug decreases your body's ability to fight infections. Try to avoid being around people who are sick. This medicine may increase your risk to bruise or bleed. Call your doctor or health care professional if you notice any unusual bleeding. You may need blood work done while you are taking this medicine. In some patients, this medicine may cause a serious brain infection that may cause death. If you have any problems seeing, thinking, speaking, walking, or standing, tell your doctor right away. If you cannot reach your doctor, urgently seek other source of medical care. Check with your doctor or health care professional if you get an attack of severe diarrhea, nausea and vomiting, or if you sweat a lot. The loss of too much body fluid can make it dangerous for you to take this medicine. Do not become pregnant while taking this medicine or for at least 7 months after stopping it. Women should inform their doctor   if they wish to become pregnant or think they might be pregnant. Men should not father a child while taking this medicine and for at least 4 months after stopping it. There is a potential for serious side effects to an unborn child. Talk to your health care professional or pharmacist for more information. Do not breast-feed an infant while taking this medicine or for 2  months after stopping it. This medicine may interfere with the ability to have a child. You should talk with your doctor or health care professional if you are concerned about your fertility. What side effects may I notice from receiving this medicine? Side effects that you should report to your doctor or health care professional as soon as possible:  allergic reactions like skin rash, itching or hives, swelling of the face, lips, or tongue  breathing problems  changes in hearing  changes in vision  fast, irregular heartbeat  feeling faint or lightheaded, falls  pain, tingling, numbness in the hands or feet  right upper belly pain  seizures  swelling of the ankles, feet, hands  unusual bleeding or bruising  unusually weak or tired  vomiting  yellowing of the eyes or skin Side effects that usually do not require medical attention (report to your doctor or health care professional if they continue or are bothersome):  changes in emotions or moods  constipation  diarrhea  loss of appetite  headache  irritation at site where injected  nausea This list may not describe all possible side effects. Call your doctor for medical advice about side effects. You may report side effects to FDA at 1-800-FDA-1088. Where should I keep my medicine? This drug is given in a hospital or clinic and will not be stored at home. NOTE: This sheet is a summary. It may not cover all possible information. If you have questions about this medicine, talk to your doctor, pharmacist, or health care provider.  2020 Elsevier/Gold Standard (2017-08-15 16:29:31)  

## 2019-12-21 ENCOUNTER — Inpatient Hospital Stay: Payer: Medicare Other

## 2019-12-21 ENCOUNTER — Ambulatory Visit: Payer: Medicare Other | Admitting: Family

## 2019-12-21 ENCOUNTER — Other Ambulatory Visit: Payer: Medicare Other

## 2019-12-28 ENCOUNTER — Encounter: Payer: Self-pay | Admitting: Family

## 2019-12-28 ENCOUNTER — Other Ambulatory Visit: Payer: Self-pay

## 2019-12-28 ENCOUNTER — Inpatient Hospital Stay: Payer: Medicare Other | Attending: Hematology & Oncology

## 2019-12-28 ENCOUNTER — Inpatient Hospital Stay: Payer: Medicare Other

## 2019-12-28 ENCOUNTER — Inpatient Hospital Stay (HOSPITAL_BASED_OUTPATIENT_CLINIC_OR_DEPARTMENT_OTHER): Payer: Medicare Other | Admitting: Family

## 2019-12-28 VITALS — BP 138/76 | HR 58 | Temp 97.7°F | Resp 18 | Ht 73.0 in | Wt 212.1 lb

## 2019-12-28 DIAGNOSIS — Z5112 Encounter for antineoplastic immunotherapy: Secondary | ICD-10-CM | POA: Insufficient documentation

## 2019-12-28 DIAGNOSIS — G620 Drug-induced polyneuropathy: Secondary | ICD-10-CM

## 2019-12-28 DIAGNOSIS — C9000 Multiple myeloma not having achieved remission: Secondary | ICD-10-CM

## 2019-12-28 DIAGNOSIS — T451X5A Adverse effect of antineoplastic and immunosuppressive drugs, initial encounter: Secondary | ICD-10-CM | POA: Diagnosis not present

## 2019-12-28 DIAGNOSIS — C9001 Multiple myeloma in remission: Secondary | ICD-10-CM

## 2019-12-28 LAB — CMP (CANCER CENTER ONLY)
ALT: 39 U/L (ref 0–44)
AST: 31 U/L (ref 15–41)
Albumin: 4.2 g/dL (ref 3.5–5.0)
Alkaline Phosphatase: 50 U/L (ref 38–126)
Anion gap: 7 (ref 5–15)
BUN: 19 mg/dL (ref 6–20)
CO2: 25 mmol/L (ref 22–32)
Calcium: 8.9 mg/dL (ref 8.9–10.3)
Chloride: 106 mmol/L (ref 98–111)
Creatinine: 1.35 mg/dL — ABNORMAL HIGH (ref 0.61–1.24)
GFR, Est AFR Am: >60 mL/min (ref >60)
GFR, Estimated: 59 mL/min — ABNORMAL LOW (ref >60)
Glucose, Bld: 91 mg/dL (ref 70–99)
Potassium: 3.8 mmol/L (ref 3.5–5.1)
Sodium: 138 mmol/L (ref 135–145)
Total Bilirubin: 0.7 mg/dL (ref 0.3–1.2)
Total Protein: 6.7 g/dL (ref 6.5–8.1)

## 2019-12-28 LAB — CBC WITH DIFFERENTIAL (CANCER CENTER ONLY)
Abs Immature Granulocytes: 0.01 10*3/uL (ref 0.00–0.07)
Basophils Absolute: 0 10*3/uL (ref 0.0–0.1)
Basophils Relative: 1 %
Eosinophils Absolute: 0.1 10*3/uL (ref 0.0–0.5)
Eosinophils Relative: 2 %
HCT: 34.8 % — ABNORMAL LOW (ref 39.0–52.0)
Hemoglobin: 12.6 g/dL — ABNORMAL LOW (ref 13.0–17.0)
Immature Granulocytes: 0 %
Lymphocytes Relative: 32 %
Lymphs Abs: 1 10*3/uL (ref 0.7–4.0)
MCH: 36.7 pg — ABNORMAL HIGH (ref 26.0–34.0)
MCHC: 36.2 g/dL — ABNORMAL HIGH (ref 30.0–36.0)
MCV: 101.5 fL — ABNORMAL HIGH (ref 80.0–100.0)
Monocytes Absolute: 0.3 10*3/uL (ref 0.1–1.0)
Monocytes Relative: 10 %
Neutro Abs: 1.7 10*3/uL (ref 1.7–7.7)
Neutrophils Relative %: 55 %
Platelet Count: 124 10*3/uL — ABNORMAL LOW (ref 150–400)
RBC: 3.43 MIL/uL — ABNORMAL LOW (ref 4.22–5.81)
RDW: 12.7 % (ref 11.5–15.5)
WBC Count: 3.1 10*3/uL — ABNORMAL LOW (ref 4.0–10.5)
nRBC: 0 % (ref 0.0–0.2)

## 2019-12-28 MED ORDER — BORTEZOMIB CHEMO SQ INJECTION 3.5 MG (2.5MG/ML)
3.0000 mg | Freq: Once | INTRAMUSCULAR | Status: AC
  Administered 2019-12-28: 3 mg via SUBCUTANEOUS
  Filled 2019-12-28: qty 1.2

## 2019-12-28 NOTE — Patient Instructions (Signed)
[facility] Discharge Instructions for Patients Receiving Chemotherapy  Today you received the following chemotherapy agents Velcade To help prevent nausea and vomiting after your treatment, we encourage you to take your nausea medication as prescribed.   If you develop nausea and vomiting that is not controlled by your nausea medication, call the clinic.   BELOW ARE SYMPTOMS THAT SHOULD BE REPORTED IMMEDIATELY:  *FEVER GREATER THAN 100.5 F  *CHILLS WITH OR WITHOUT FEVER  NAUSEA AND VOMITING THAT IS NOT CONTROLLED WITH YOUR NAUSEA MEDICATION  *UNUSUAL SHORTNESS OF BREATH  *UNUSUAL BRUISING OR BLEEDING  TENDERNESS IN MOUTH AND THROAT WITH OR WITHOUT PRESENCE OF ULCERS  *URINARY PROBLEMS  *BOWEL PROBLEMS  UNUSUAL RASH Items with * indicate a potential emergency and should be followed up as soon as possible.  Feel free to call the clinic should you have any questions or concerns. The clinic phone number is [phone].  Please show the CHEMO ALERT CARD at check-in to the Emergency Department and triage nurse.   

## 2019-12-28 NOTE — Progress Notes (Signed)
Hematology and Oncology Follow Up Visit  Tyler Phillips [id number] Aug 25, 1965 54 y.o. 12/28/2019   Principle Diagnosis:  IgG Kappa myeloma - +4, +14, +17  Past Therapy: Status post autologous stem cell transplant on 05/22/2015 S/pcycle 6 of RVD Ninlaro 4 mg po q 2 week -- start on 01/02/2019 -- d/c on 02/10/2019  Current Therapy: Velcade q 2wk dosing/Revlimid 10mg  po q day (21/7)   Interim History:  Tyler Phillips is here today for follow-up. He had another [place] chest tightness [place] This lasted of and on for a couple days and he states it was just like what he experienced a year ago while on vacation in [place].  He feels this may be related to anxiety and PTSD with history of myeloma diagnosis and treatment. He does not feel like he has a PE. No cough.  He takes Ativan only as needed. He states that he will let us know if this returns or becomes more frequent.  Since being home he has been able to resume cross fit workouts without any problems. He states that this actually helps him feel better.  In July he had no M-spike. We are closely following his kappa light chains which were 4.68 mg/dL. IgG level 1,307 mg/dL.  No fever, chills, n/v, rash, dizziness, palpitations, abdominal pain or changes in bowel or bladder habits.   He still has issues at times with loose stool and urgency. He states that at this time pepto helps. He can also try Imodium as needed.  No swelling or tenderness in his extremities.  The neuropathy in his feet has remained pretty stable. He states that it is sometime a little more prominent in the left Phillips and rates it a 2/10 at the worst. No falls or syncope.  He has maintained a good appetite and is staying well hydrated. His weight is stable.    ECOG Performance Status: 1 - Symptomatic but completely ambulatory  Medications:  Allergies as of 12/28/2019   No Known Allergies     Medication List        Accurate as of December 28, 2019 11:08 AM. If you have any questions, ask your nurse or doctor.        aspirin 325 MG tablet Take 325 mg by mouth daily.   bortezomib IV 3.5 MG injection Commonly known as: VELCADE Inject as directed every 14 (fourteen) days.   cetirizine 10 MG tablet Commonly known as: ZYRTEC Take 10 mg by mouth daily.   lenalidomide 10 MG capsule Commonly known as: Revlimid TAKE 1 CAPSULE BY MOUTH  DAILY FOR 21 DAYS ON, THEN  7 DAYS OFF.  Authorization # [id number]   LORazepam 1 MG tablet Commonly known as: ATIVAN Take 1 tablet (1 mg total) by mouth every 4 (four) hours as needed.   prochlorperazine 10 MG tablet Commonly known as: COMPAZINE Take 10 mg by mouth every 6 (six) hours as needed.   valACYclovir 500 MG tablet Commonly known as: VALTREX Take 500 mg by mouth 2 (two) times daily.   zolpidem 10 MG tablet Commonly known as: AMBIEN Take 1 tablet (10 mg total) by mouth at bedtime as needed for sleep.       Allergies: No Known Allergies  Past Medical History, Surgical history, Social history, and Family History were reviewed and updated.  Review of Systems: All other 10 point review of systems is negative.   Physical Exam:  vitals were not taken for this visit.  Wt Readings from Last 3 Encounters:  11/09/19 (!) 214 lb (97.1 kg)  10/12/19 218 lb (98.9 kg)  08/31/19 217 lb (98.4 kg)    Ocular: Sclerae unicteric, pupils equal, round and reactive to light Ear-nose-throat: Oropharynx clear, dentition fair Lymphatic: No cervical or supraclavicular adenopathy Lungs no rales or rhonchi, good excursion bilaterally Heart regular rate and rhythm, no murmur appreciated Abd soft, nontender, positive bowel sounds, no liver or spleen tip palpated on exam, no fluid wave  MSK no focal spinal tenderness, no joint edema Neuro: non-focal, well-oriented, appropriate affect Breasts: Deferred   Lab Results  Component Value Date   WBC 3.1 (L) 12/28/2019    HGB 12.6 (L) 12/28/2019   HCT 34.8 (L) 12/28/2019   MCV 101.5 (H) 12/28/2019   PLT 124 (L) 12/28/2019   Lab Results  Component Value Date   FERRITIN 1,263 (H) 07/20/2014   IRON 125 07/20/2014   TIBC 198 (L) 07/20/2014   UIBC 73 (L) 07/20/2014   IRONPCTSAT 63 (H) 07/20/2014   Lab Results  Component Value Date   RETICCTPCT 0.8 07/20/2014   RBC 3.43 (L) 12/28/2019   Lab Results  Component Value Date   KPAFRELGTCHN 46.8 (H) 11/09/2019   LAMBDASER 29.3 (H) 11/09/2019   KAPLAMBRATIO 1.60 11/09/2019   Lab Results  Component Value Date   IGGSERUM 1,307 11/09/2019   IGA 215 11/09/2019   IGMSERUM 18 (L) 11/09/2019   Lab Results  Component Value Date   TOTALPROTELP 7.0 11/09/2019   ALBUMINELP 3.9 11/09/2019   A1GS 0.1 11/09/2019   A2GS 0.6 11/09/2019   BETS 1.0 11/09/2019   BETA2SER 0.3 03/28/2015   GAMS 1.4 11/09/2019   MSPIKE Not Observed 11/09/2019   SPEI Comment 10/12/2019     Chemistry      Component Value Date/Time   NA 137 12/14/2019 1002   NA 140 04/22/2017 1140   NA 138 09/03/2015 1042   K 4.2 12/14/2019 1002   K 4.0 04/22/2017 1140   K 4.3 09/03/2015 1042   CL 106 12/14/2019 1002   CL 105 04/22/2017 1140   CO2 25 12/14/2019 1002   CO2 24 04/22/2017 1140   CO2 25 09/03/2015 1042   BUN 14 12/14/2019 1002   BUN 16 04/22/2017 1140   BUN 21.7 09/03/2015 1042   CREATININE 1.30 (H) 12/14/2019 1002   CREATININE 1.6 (H) 04/22/2017 1140   CREATININE 1.2 09/03/2015 1042      Component Value Date/Time   CALCIUM 8.8 (L) 12/14/2019 1002   CALCIUM 8.5 04/22/2017 1140   CALCIUM 9.1 09/03/2015 1042   ALKPHOS 49 12/14/2019 1002   ALKPHOS 59 04/22/2017 1140   ALKPHOS 42 09/03/2015 1042   AST 24 12/14/2019 1002   AST 27 09/03/2015 1042   ALT 40 12/14/2019 1002   ALT 45 04/22/2017 1140   ALT 41 09/03/2015 1042   BILITOT 0.7 12/14/2019 1002   BILITOT 0.54 09/03/2015 1042       Impression and Plan: Tyler Phillips is a very pleasant 54 yo caucasian gentleman  withIgG kappa myeloma. He underwent induction chemotherapy with RVD followed by an autologous stem cell transplant at [facility] on February 2017. We continue to follow him closely and are monitoring his light chains. Myeloma studies are pending.  We will proceed with treatment today as planned. He will continue his every 2 week treatment schedule.  Follow-up in 1 month.  He will contact our office with any questions or concerns. We can certainly see him sooner if needed.  Laverna Peace, NP 9/10/202111:08 AM

## 2019-12-29 LAB — IGG, IGA, IGM
IgA: 207 mg/dL (ref 90–386)
IgG (Immunoglobin G), Serum: 1207 mg/dL (ref 603–1613)
IgM (Immunoglobulin M), Srm: 18 mg/dL — ABNORMAL LOW (ref 20–172)

## 2019-12-31 LAB — PROTEIN ELECTROPHORESIS, SERUM, WITH REFLEX
A/G Ratio: 1.3 (ref 0.7–1.7)
Albumin ELP: 3.9 g/dL (ref 2.9–4.4)
Alpha-1-Globulin: 0.2 g/dL (ref 0.0–0.4)
Alpha-2-Globulin: 0.6 g/dL (ref 0.4–1.0)
Beta Globulin: 0.9 g/dL (ref 0.7–1.3)
Gamma Globulin: 1.3 g/dL (ref 0.4–1.8)
Globulin, Total: 2.9 g/dL (ref 2.2–3.9)
Total Protein ELP: 6.8 g/dL (ref 6.0–8.5)

## 2019-12-31 LAB — KAPPA/LAMBDA LIGHT CHAINS
Kappa free light chain: 46.7 mg/L — ABNORMAL HIGH (ref 3.3–19.4)
Kappa, lambda light chain ratio: 1.71 — ABNORMAL HIGH (ref 0.26–1.65)
Lambda free light chains: 27.3 mg/L — ABNORMAL HIGH (ref 5.7–26.3)

## 2020-01-01 ENCOUNTER — Encounter: Payer: Self-pay | Admitting: *Deleted

## 2020-01-02 ENCOUNTER — Other Ambulatory Visit: Payer: Self-pay | Admitting: Hematology & Oncology

## 2020-01-02 DIAGNOSIS — C9002 Multiple myeloma in relapse: Secondary | ICD-10-CM

## 2020-01-03 ENCOUNTER — Other Ambulatory Visit: Payer: Self-pay | Admitting: *Deleted

## 2020-01-03 DIAGNOSIS — C9002 Multiple myeloma in relapse: Secondary | ICD-10-CM

## 2020-01-03 MED ORDER — LENALIDOMIDE 10 MG PO CAPS: ORAL_CAPSULE | ORAL | 0 refills | Status: DC

## 2020-01-11 ENCOUNTER — Inpatient Hospital Stay: Payer: Medicare Other

## 2020-01-11 ENCOUNTER — Other Ambulatory Visit: Payer: Self-pay

## 2020-01-11 VITALS — BP 128/84 | HR 52 | Temp 98.4°F | Resp 18

## 2020-01-11 DIAGNOSIS — C9000 Multiple myeloma not having achieved remission: Secondary | ICD-10-CM

## 2020-01-11 DIAGNOSIS — C9001 Multiple myeloma in remission: Secondary | ICD-10-CM

## 2020-01-11 DIAGNOSIS — Z5112 Encounter for antineoplastic immunotherapy: Secondary | ICD-10-CM | POA: Diagnosis not present

## 2020-01-11 LAB — CBC WITH DIFFERENTIAL (CANCER CENTER ONLY)
Abs Immature Granulocytes: 0 10*3/uL (ref 0.00–0.07)
Basophils Absolute: 0 10*3/uL (ref 0.0–0.1)
Basophils Relative: 0 %
Eosinophils Absolute: 0 10*3/uL (ref 0.0–0.5)
Eosinophils Relative: 1 %
HCT: 37.4 % — ABNORMAL LOW (ref 39.0–52.0)
Hemoglobin: 13.5 g/dL (ref 13.0–17.0)
Immature Granulocytes: 0 %
Lymphocytes Relative: 38 %
Lymphs Abs: 1.1 10*3/uL (ref 0.7–4.0)
MCH: 36.9 pg — ABNORMAL HIGH (ref 26.0–34.0)
MCHC: 36.1 g/dL — ABNORMAL HIGH (ref 30.0–36.0)
MCV: 102.2 fL — ABNORMAL HIGH (ref 80.0–100.0)
Monocytes Absolute: 0.3 10*3/uL (ref 0.1–1.0)
Monocytes Relative: 10 %
Neutro Abs: 1.5 10*3/uL — ABNORMAL LOW (ref 1.7–7.7)
Neutrophils Relative %: 51 %
Platelet Count: 154 10*3/uL (ref 150–400)
RBC: 3.66 MIL/uL — ABNORMAL LOW (ref 4.22–5.81)
RDW: 12.9 % (ref 11.5–15.5)
WBC Count: 2.9 10*3/uL — ABNORMAL LOW (ref 4.0–10.5)
nRBC: 0 % (ref 0.0–0.2)

## 2020-01-11 LAB — CMP (CANCER CENTER ONLY)
ALT: 54 U/L — ABNORMAL HIGH (ref 0–44)
AST: 27 U/L (ref 15–41)
Albumin: 4.4 g/dL (ref 3.5–5.0)
Alkaline Phosphatase: 53 U/L (ref 38–126)
Anion gap: 7 (ref 5–15)
BUN: 14 mg/dL (ref 6–20)
CO2: 25 mmol/L (ref 22–32)
Calcium: 9.4 mg/dL (ref 8.9–10.3)
Chloride: 106 mmol/L (ref 98–111)
Creatinine: 1.3 mg/dL — ABNORMAL HIGH (ref 0.61–1.24)
GFR, Est AFR Am: >60 mL/min (ref >60)
GFR, Estimated: >60 mL/min (ref >60)
Glucose, Bld: 81 mg/dL (ref 70–99)
Potassium: 4.2 mmol/L (ref 3.5–5.1)
Sodium: 138 mmol/L (ref 135–145)
Total Bilirubin: 0.6 mg/dL (ref 0.3–1.2)
Total Protein: 7.3 g/dL (ref 6.5–8.1)

## 2020-01-11 MED ORDER — BORTEZOMIB CHEMO SQ INJECTION 3.5 MG (2.5MG/ML)
3.0000 mg | Freq: Once | INTRAMUSCULAR | Status: AC
  Administered 2020-01-11: 3 mg via SUBCUTANEOUS
  Filled 2020-01-11: qty 1.2

## 2020-01-11 NOTE — Patient Instructions (Signed)
[facility] Discharge Instructions for Patients Receiving Chemotherapy  Today you received the following chemotherapy agents Velcade To help prevent nausea and vomiting after your treatment, we encourage you to take your nausea medication as prescribed.   If you develop nausea and vomiting that is not controlled by your nausea medication, call the clinic.   BELOW ARE SYMPTOMS THAT SHOULD BE REPORTED IMMEDIATELY:  *FEVER GREATER THAN 100.5 F  *CHILLS WITH OR WITHOUT FEVER  NAUSEA AND VOMITING THAT IS NOT CONTROLLED WITH YOUR NAUSEA MEDICATION  *UNUSUAL SHORTNESS OF BREATH  *UNUSUAL BRUISING OR BLEEDING  TENDERNESS IN MOUTH AND THROAT WITH OR WITHOUT PRESENCE OF ULCERS  *URINARY PROBLEMS  *BOWEL PROBLEMS  UNUSUAL RASH Items with * indicate a potential emergency and should be followed up as soon as possible.  Feel free to call the clinic should you have any questions or concerns. The clinic phone number is [phone].  Please show the CHEMO ALERT CARD at check-in to the Emergency Department and triage nurse.   

## 2020-01-25 ENCOUNTER — Encounter: Payer: Self-pay | Admitting: Hematology & Oncology

## 2020-01-25 ENCOUNTER — Other Ambulatory Visit: Payer: Self-pay

## 2020-01-25 ENCOUNTER — Inpatient Hospital Stay: Payer: Medicare Other

## 2020-01-25 ENCOUNTER — Inpatient Hospital Stay: Payer: Medicare Other | Attending: Hematology & Oncology | Admitting: Hematology & Oncology

## 2020-01-25 VITALS — BP 138/83 | HR 54 | Temp 98.1°F | Resp 17 | Wt 217.0 lb

## 2020-01-25 DIAGNOSIS — T451X5A Adverse effect of antineoplastic and immunosuppressive drugs, initial encounter: Secondary | ICD-10-CM

## 2020-01-25 DIAGNOSIS — C9000 Multiple myeloma not having achieved remission: Secondary | ICD-10-CM | POA: Insufficient documentation

## 2020-01-25 DIAGNOSIS — Z5112 Encounter for antineoplastic immunotherapy: Secondary | ICD-10-CM | POA: Insufficient documentation

## 2020-01-25 DIAGNOSIS — G620 Drug-induced polyneuropathy: Secondary | ICD-10-CM

## 2020-01-25 DIAGNOSIS — C9001 Multiple myeloma in remission: Secondary | ICD-10-CM

## 2020-01-25 LAB — CBC WITH DIFFERENTIAL (CANCER CENTER ONLY)
Abs Immature Granulocytes: 0.01 10*3/uL (ref 0.00–0.07)
Basophils Absolute: 0 10*3/uL (ref 0.0–0.1)
Basophils Relative: 1 %
Eosinophils Absolute: 0.1 10*3/uL (ref 0.0–0.5)
Eosinophils Relative: 3 %
HCT: 35.8 % — ABNORMAL LOW (ref 39.0–52.0)
Hemoglobin: 12.7 g/dL — ABNORMAL LOW (ref 13.0–17.0)
Immature Granulocytes: 0 %
Lymphocytes Relative: 33 %
Lymphs Abs: 0.9 10*3/uL (ref 0.7–4.0)
MCH: 36.6 pg — ABNORMAL HIGH (ref 26.0–34.0)
MCHC: 35.5 g/dL (ref 30.0–36.0)
MCV: 103.2 fL — ABNORMAL HIGH (ref 80.0–100.0)
Monocytes Absolute: 0.3 10*3/uL (ref 0.1–1.0)
Monocytes Relative: 13 %
Neutro Abs: 1.4 10*3/uL — ABNORMAL LOW (ref 1.7–7.7)
Neutrophils Relative %: 50 %
Platelet Count: 111 10*3/uL — ABNORMAL LOW (ref 150–400)
RBC: 3.47 MIL/uL — ABNORMAL LOW (ref 4.22–5.81)
RDW: 12.6 % (ref 11.5–15.5)
WBC Count: 2.7 10*3/uL — ABNORMAL LOW (ref 4.0–10.5)
nRBC: 0 % (ref 0.0–0.2)

## 2020-01-25 LAB — CMP (CANCER CENTER ONLY)
ALT: 47 U/L — ABNORMAL HIGH (ref 0–44)
AST: 34 U/L (ref 15–41)
Albumin: 4.2 g/dL (ref 3.5–5.0)
Alkaline Phosphatase: 51 U/L (ref 38–126)
Anion gap: 6 (ref 5–15)
BUN: 15 mg/dL (ref 6–20)
CO2: 28 mmol/L (ref 22–32)
Calcium: 9 mg/dL (ref 8.9–10.3)
Chloride: 105 mmol/L (ref 98–111)
Creatinine: 1.31 mg/dL — ABNORMAL HIGH (ref 0.61–1.24)
GFR, Estimated: >60 mL/min (ref >60)
Glucose, Bld: 90 mg/dL (ref 70–99)
Potassium: 4.4 mmol/L (ref 3.5–5.1)
Sodium: 139 mmol/L (ref 135–145)
Total Bilirubin: 0.5 mg/dL (ref 0.3–1.2)
Total Protein: 6.8 g/dL (ref 6.5–8.1)

## 2020-01-25 MED ORDER — BORTEZOMIB CHEMO SQ INJECTION 3.5 MG (2.5MG/ML)
3.0000 mg | Freq: Once | INTRAMUSCULAR | Status: DC
  Administered 2020-01-25: 3 mg via SUBCUTANEOUS
  Filled 2020-01-25: qty 1.2

## 2020-01-25 NOTE — Progress Notes (Signed)
Hematology and Oncology Follow Up Visit  Tyler Phillips [id number] 1965-10-03 54 y.o. 01/25/2020   Principle Diagnosis:  IgG Kappa myeloma - +4, +14, +17  Past Therapy: Status post autologous stem cell transplant on 05/22/2015 S/pcycle 6 of RVD Ninlaro 4 mg po q 2 week -- start on 01/02/2019 -- d/c on 02/10/2019  Current Therapy: Velcade q 2wk dosing/Revlimid 10mg  po q day (21/7)   Interim History:  [name] is here today for follow-up.  As always, he has been pretty busy.  He is getting ready to go on a fairly long trip down [place].  He will be going down to [place], ultimately.    He is doing well with treatment.  He is doing well with the Revlimid.  He does have diarrhea on occasion.  We are monitoring his light chains.  They have been holding pretty steady.  His last Kappa light chain was 4.6 mg/dL.  He has had no problems with cough or shortness of breath.  He has had the coronavirus vaccines.  He has had no rashes.  There has been no leg swelling.  He has had no nausea or vomiting.  He has had no headache.  He has had he has maintained a good appetite and is staying well hydrated. His weight is stable.    ECOG Performance Status: 1 - Symptomatic but completely ambulatory  Medications:  Allergies as of 01/25/2020   No Known Allergies     Medication List       Accurate as of January 25, 2020 12:13 PM. If you have any questions, ask your nurse or doctor.        aspirin 325 MG tablet Take 325 mg by mouth daily.   bortezomib IV 3.5 MG injection Commonly known as: VELCADE Inject as directed every 14 (fourteen) days.   cetirizine 10 MG tablet Commonly known as: ZYRTEC Take 10 mg by mouth daily.   clindamycin 300 MG capsule Commonly known as: CLEOCIN Take 300 mg by mouth. Prior to dental procedures.   lenalidomide 10 MG capsule Commonly known as: Revlimid TAKE 1 CAPSULE BY MOUTH  DAILY FOR 21 DAYS ON, THEN  7 DAYS OFF Auth# [id number]   LORazepam  1 MG tablet Commonly known as: ATIVAN Take 1 tablet (1 mg total) by mouth every 4 (four) hours as needed.   prochlorperazine 10 MG tablet Commonly known as: COMPAZINE Take 10 mg by mouth every 6 (six) hours as needed.   valACYclovir 500 MG tablet Commonly known as: VALTREX Take 500 mg by mouth 2 (two) times daily.   zolpidem 10 MG tablet Commonly known as: AMBIEN Take 1 tablet (10 mg total) by mouth at bedtime as needed for sleep.       Allergies: No Known Allergies  Past Medical History, Surgical history, Social history, and Family History were reviewed and updated.  Review of Systems: Review of Systems  Constitutional: Negative.   HENT: Negative.   Eyes: Negative.   Respiratory: Negative.   Cardiovascular: Negative.   Gastrointestinal: Negative.   Genitourinary: Negative.   Musculoskeletal: Negative.   Skin: Negative.   Neurological: Negative.   Endo/Heme/Allergies: Negative.   Psychiatric/Behavioral: Negative.      Physical Exam:  weight is 217 lb (98.4 kg). His oral temperature is 98.1 F (36.7 C). His blood pressure is 138/83 and his pulse is 54 (abnormal). His respiration is 17 and oxygen saturation is 100%.   Wt Readings from Last 3 Encounters:  01/25/20 217 lb (98.4 kg)  12/28/19 212 lb 1.3 oz (96.2 kg)  11/09/19 (!) 214 lb (97.1 kg)   Physical Exam Vitals reviewed.  HENT:     Head: Normocephalic and atraumatic.  Eyes:     Pupils: Pupils are equal, round, and reactive to light.  Cardiovascular:     Rate and Rhythm: Normal rate and regular rhythm.     Heart sounds: Normal heart sounds.  Pulmonary:     Effort: Pulmonary effort is normal.     Breath sounds: Normal breath sounds.  Abdominal:     General: Bowel sounds are normal.     Palpations: Abdomen is soft.  Musculoskeletal:        General: No tenderness or deformity. Normal range of motion.     Cervical back: Normal range of motion.  Lymphadenopathy:     Cervical: No cervical adenopathy.    Skin:    General: Skin is warm and dry.     Findings: No erythema or rash.  Neurological:     Mental Status: He is alert and oriented to person, place, and time.  Psychiatric:        Behavior: Behavior normal.        Thought Content: Thought content normal.        Judgment: Judgment normal.      Lab Results  Component Value Date   WBC 2.7 (L) 01/25/2020   HGB 12.7 (L) 01/25/2020   HCT 35.8 (L) 01/25/2020   MCV 103.2 (H) 01/25/2020   PLT 111 (L) 01/25/2020   Lab Results  Component Value Date   FERRITIN 1,263 (H) 07/20/2014   IRON 125 07/20/2014   TIBC 198 (L) 07/20/2014   UIBC 73 (L) 07/20/2014   IRONPCTSAT 63 (H) 07/20/2014   Lab Results  Component Value Date   RETICCTPCT 0.8 07/20/2014   RBC 3.47 (L) 01/25/2020   Lab Results  Component Value Date   KPAFRELGTCHN 46.7 (H) 12/28/2019   LAMBDASER 27.3 (H) 12/28/2019   KAPLAMBRATIO 1.71 (H) 12/28/2019   Lab Results  Component Value Date   IGGSERUM 1,207 12/28/2019   IGA 207 12/28/2019   IGMSERUM 18 (L) 12/28/2019   Lab Results  Component Value Date   TOTALPROTELP 6.8 12/28/2019   ALBUMINELP 3.9 12/28/2019   A1GS 0.2 12/28/2019   A2GS 0.6 12/28/2019   BETS 0.9 12/28/2019   BETA2SER 0.3 03/28/2015   GAMS 1.3 12/28/2019   MSPIKE Not Observed 12/28/2019   SPEI Comment 10/12/2019     Chemistry      Component Value Date/Time   NA 139 01/25/2020 1032   NA 140 04/22/2017 1140   NA 138 09/03/2015 1042   K 4.4 01/25/2020 1032   K 4.0 04/22/2017 1140   K 4.3 09/03/2015 1042   CL 105 01/25/2020 1032   CL 105 04/22/2017 1140   CO2 28 01/25/2020 1032   CO2 24 04/22/2017 1140   CO2 25 09/03/2015 1042   BUN 15 01/25/2020 1032   BUN 16 04/22/2017 1140   BUN 21.7 09/03/2015 1042   CREATININE 1.31 (H) 01/25/2020 1032   CREATININE 1.6 (H) 04/22/2017 1140   CREATININE 1.2 09/03/2015 1042      Component Value Date/Time   CALCIUM 9.0 01/25/2020 1032   CALCIUM 8.5 04/22/2017 1140   CALCIUM 9.1 09/03/2015 1042    ALKPHOS 51 01/25/2020 1032   ALKPHOS 59 04/22/2017 1140   ALKPHOS 42 09/03/2015 1042   AST 34 01/25/2020 1032   AST 27 09/03/2015 1042   ALT 47 (H) 01/25/2020  1032   ALT 45 04/22/2017 1140   ALT 41 09/03/2015 1042   BILITOT 0.5 01/25/2020 1032   BILITOT 0.54 09/03/2015 1042       Impression and Plan: [name] is a very pleasant 54 yo caucasian gentleman withIgG kappa myeloma. He underwent induction chemotherapy with RVD followed by an autologous stem cell transplant at [facility] on February 2017.  We are watching his Kappa light chain closely.  His could be an indication that there could be active disease.  For right now, he will stay on the Velcade and Revlimid.  We will adjust his protocol a little bit to accommodate his travel.  He will get treated today.  His next treatment would not be until November.   [name], MD 10/8/202112:13 PM

## 2020-01-25 NOTE — Patient Instructions (Signed)
[facility] Discharge Instructions for Patients Receiving Chemotherapy  Today you received the following chemotherapy agents Velcade To help prevent nausea and vomiting after your treatment, we encourage you to take your nausea medication as prescribed.   If you develop nausea and vomiting that is not controlled by your nausea medication, call the clinic.   BELOW ARE SYMPTOMS THAT SHOULD BE REPORTED IMMEDIATELY:  *FEVER GREATER THAN 100.5 F  *CHILLS WITH OR WITHOUT FEVER  NAUSEA AND VOMITING THAT IS NOT CONTROLLED WITH YOUR NAUSEA MEDICATION  *UNUSUAL SHORTNESS OF BREATH  *UNUSUAL BRUISING OR BLEEDING  TENDERNESS IN MOUTH AND THROAT WITH OR WITHOUT PRESENCE OF ULCERS  *URINARY PROBLEMS  *BOWEL PROBLEMS  UNUSUAL RASH Items with * indicate a potential emergency and should be followed up as soon as possible.  Feel free to call the clinic should you have any questions or concerns. The clinic phone number is [phone].  Please show the CHEMO ALERT CARD at check-in to the Emergency Department and triage nurse.   

## 2020-01-27 LAB — IGG, IGA, IGM
IgA: 221 mg/dL (ref 90–386)
IgG (Immunoglobin G), Serum: 1291 mg/dL (ref 603–1613)
IgM (Immunoglobulin M), Srm: 18 mg/dL — ABNORMAL LOW (ref 20–172)

## 2020-01-28 ENCOUNTER — Other Ambulatory Visit: Payer: Self-pay | Admitting: Hematology & Oncology

## 2020-01-28 DIAGNOSIS — C9002 Multiple myeloma in relapse: Secondary | ICD-10-CM

## 2020-01-28 LAB — PROTEIN ELECTROPHORESIS, SERUM
A/G Ratio: 1.4 (ref 0.7–1.7)
Albumin ELP: 3.8 g/dL (ref 2.9–4.4)
Alpha-1-Globulin: 0.2 g/dL (ref 0.0–0.4)
Alpha-2-Globulin: 0.5 g/dL (ref 0.4–1.0)
Beta Globulin: 0.9 g/dL (ref 0.7–1.3)
Gamma Globulin: 1.2 g/dL (ref 0.4–1.8)
Globulin, Total: 2.8 g/dL (ref 2.2–3.9)
Total Protein ELP: 6.6 g/dL (ref 6.0–8.5)

## 2020-01-28 LAB — KAPPA/LAMBDA LIGHT CHAINS
Kappa free light chain: 47.4 mg/L — ABNORMAL HIGH (ref 3.3–19.4)
Kappa, lambda light chain ratio: 1.62 (ref 0.26–1.65)
Lambda free light chains: 29.3 mg/L — ABNORMAL HIGH (ref 5.7–26.3)

## 2020-01-29 ENCOUNTER — Encounter: Payer: Self-pay | Admitting: *Deleted

## 2020-01-30 ENCOUNTER — Other Ambulatory Visit: Payer: Self-pay | Admitting: *Deleted

## 2020-01-30 DIAGNOSIS — C9002 Multiple myeloma in relapse: Secondary | ICD-10-CM

## 2020-01-30 MED ORDER — LENALIDOMIDE 10 MG PO CAPS: ORAL_CAPSULE | ORAL | 0 refills | Status: DC

## 2020-02-02 ENCOUNTER — Encounter: Payer: Self-pay | Admitting: Family

## 2020-02-04 ENCOUNTER — Encounter: Payer: Self-pay | Admitting: *Deleted

## 2020-02-04 NOTE — Progress Notes (Signed)
Patient received the flu shot at [facility] on 01/21/2020. Injected into the left deltoid. Manufacturer [employer],lot # [id number], expiration date is 09/2020.

## 2020-02-20 ENCOUNTER — Other Ambulatory Visit: Payer: Self-pay

## 2020-02-20 ENCOUNTER — Other Ambulatory Visit: Payer: Self-pay | Admitting: Hematology & Oncology

## 2020-02-20 DIAGNOSIS — C9002 Multiple myeloma in relapse: Secondary | ICD-10-CM

## 2020-02-20 MED ORDER — LENALIDOMIDE 10 MG PO CAPS: ORAL_CAPSULE | ORAL | 0 refills | Status: DC

## 2020-02-22 ENCOUNTER — Inpatient Hospital Stay: Payer: Medicare Other | Attending: Hematology & Oncology | Admitting: Hematology & Oncology

## 2020-02-22 ENCOUNTER — Inpatient Hospital Stay: Payer: Medicare Other

## 2020-02-22 ENCOUNTER — Encounter: Payer: Self-pay | Admitting: Hematology & Oncology

## 2020-02-22 ENCOUNTER — Other Ambulatory Visit: Payer: Self-pay

## 2020-02-22 VITALS — BP 129/86 | HR 56 | Temp 97.9°F | Resp 17 | Wt 214.0 lb

## 2020-02-22 DIAGNOSIS — Z5112 Encounter for antineoplastic immunotherapy: Secondary | ICD-10-CM | POA: Diagnosis not present

## 2020-02-22 DIAGNOSIS — C9001 Multiple myeloma in remission: Secondary | ICD-10-CM | POA: Diagnosis not present

## 2020-02-22 DIAGNOSIS — C9000 Multiple myeloma not having achieved remission: Secondary | ICD-10-CM | POA: Insufficient documentation

## 2020-02-22 LAB — CMP (CANCER CENTER ONLY)
ALT: 43 U/L (ref 0–44)
AST: 26 U/L (ref 15–41)
Albumin: 4.4 g/dL (ref 3.5–5.0)
Alkaline Phosphatase: 55 U/L (ref 38–126)
Anion gap: 6 (ref 5–15)
BUN: 11 mg/dL (ref 6–20)
CO2: 27 mmol/L (ref 22–32)
Calcium: 9.5 mg/dL (ref 8.9–10.3)
Chloride: 105 mmol/L (ref 98–111)
Creatinine: 1.36 mg/dL — ABNORMAL HIGH (ref 0.61–1.24)
GFR, Estimated: >60 mL/min (ref >60)
Glucose, Bld: 93 mg/dL (ref 70–99)
Potassium: 4.2 mmol/L (ref 3.5–5.1)
Sodium: 138 mmol/L (ref 135–145)
Total Bilirubin: 0.7 mg/dL (ref 0.3–1.2)
Total Protein: 7.2 g/dL (ref 6.5–8.1)

## 2020-02-22 LAB — CBC WITH DIFFERENTIAL (CANCER CENTER ONLY)
Abs Immature Granulocytes: 0 10*3/uL (ref 0.00–0.07)
Basophils Absolute: 0 10*3/uL (ref 0.0–0.1)
Basophils Relative: 1 %
Eosinophils Absolute: 0.1 10*3/uL (ref 0.0–0.5)
Eosinophils Relative: 3 %
HCT: 38.1 % — ABNORMAL LOW (ref 39.0–52.0)
Hemoglobin: 13.4 g/dL (ref 13.0–17.0)
Immature Granulocytes: 0 %
Lymphocytes Relative: 32 %
Lymphs Abs: 1 10*3/uL (ref 0.7–4.0)
MCH: 35.9 pg — ABNORMAL HIGH (ref 26.0–34.0)
MCHC: 35.2 g/dL (ref 30.0–36.0)
MCV: 102.1 fL — ABNORMAL HIGH (ref 80.0–100.0)
Monocytes Absolute: 0.3 10*3/uL (ref 0.1–1.0)
Monocytes Relative: 10 %
Neutro Abs: 1.7 10*3/uL (ref 1.7–7.7)
Neutrophils Relative %: 54 %
Platelet Count: 131 10*3/uL — ABNORMAL LOW (ref 150–400)
RBC: 3.73 MIL/uL — ABNORMAL LOW (ref 4.22–5.81)
RDW: 12.9 % (ref 11.5–15.5)
WBC Count: 3.1 10*3/uL — ABNORMAL LOW (ref 4.0–10.5)
nRBC: 0 % (ref 0.0–0.2)

## 2020-02-22 MED ORDER — BORTEZOMIB CHEMO SQ INJECTION 3.5 MG (2.5MG/ML)
3.0000 mg | Freq: Once | INTRAMUSCULAR | Status: AC
  Administered 2020-02-22: 3 mg via SUBCUTANEOUS
  Filled 2020-02-22: qty 1.2

## 2020-02-22 NOTE — Progress Notes (Signed)
Hematology and Oncology Follow Up Visit  Tyler Phillips [id number] 05-Dec-1965 54 y.o. 02/22/2020   Principle Diagnosis:  IgG Kappa myeloma - +4, +14, +17  Past Therapy: Status post autologous stem cell transplant on 05/22/2015 S/pcycle 6 of RVD Ninlaro 4 mg po q 2 week -- start on 01/02/2019 -- d/c on 02/10/2019  Current Therapy: Velcade q 2wk dosing/Revlimid 10mg  po q day (21/7)   Interim History:  [name] is here today for follow-up.  As always, he has been pretty busy.  He has been traveling.  He and his wife just got back from [place].  Before this, they are in [place].  It sounds like he will be going up to [place].  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  Overall as always he has been doing fantastic.  He is very energetic.  He exercises.  He does not work outside.  We have been watching his light chains very closely.  When we last saw him in October, his kappa light chain was holding stable at 4.7 mg/dL.  He has had no problems with the Revlimid.  There is no tingling or numbness in his hands or feet.  He has had no issues with the Velcade.  There is been no problems with fever.  He has had no cough.  He has had no nausea or vomiting.  There has been no diarrhea.  He has had no rash.  Overall, his performance status is ECOG 0.   Medications:  Allergies as of 02/22/2020   No Known Allergies     Medication List       Accurate as of February 22, 2020 12:06 PM. If you have any questions, ask your nurse or doctor.        aspirin 325 MG tablet Take 325 mg by mouth daily.   bortezomib IV 3.5 MG injection Commonly known as: VELCADE Inject as directed every 14 (fourteen) days.   cetirizine 10 MG tablet Commonly known as: ZYRTEC Take 10 mg by mouth daily.   clindamycin 300 MG capsule Commonly known as: CLEOCIN Take 300 mg by mouth. Prior to dental procedures.   lenalidomide 10 MG capsule Commonly known as: Revlimid TAKE 1 CAPSULE BY MOUTH  DAILY  FOR 21 DAYS ON, THEN  7 DAYS OFF Auth #[id number]   LORazepam 1 MG tablet Commonly known as: ATIVAN Take 1 tablet (1 mg total) by mouth every 4 (four) hours as needed.   prochlorperazine 10 MG tablet Commonly known as: COMPAZINE Take 10 mg by mouth every 6 (six) hours as needed.   valACYclovir 500 MG tablet Commonly known as: VALTREX Take 500 mg by mouth 2 (two) times daily.   zolpidem 10 MG tablet Commonly known as: AMBIEN Take 1 tablet (10 mg total) by mouth at bedtime as needed for sleep.       Allergies: No Known Allergies  Past Medical History, Surgical history, Social history, and Family History were reviewed and updated.  Review of Systems: Review of Systems  Constitutional: Negative.   HENT: Negative.   Eyes: Negative.   Respiratory: Negative.   Cardiovascular: Negative.   Gastrointestinal: Negative.   Genitourinary: Negative.   Musculoskeletal: Negative.   Skin: Negative.   Neurological: Negative.   Endo/Heme/Allergies: Negative.   Psychiatric/Behavioral: Negative.      Physical Exam:  weight is 214 lb (97.1 kg). His oral temperature is 97.9 F (36.6 C). His blood pressure is 129/86 and his pulse is 56 (abnormal). His respiration is 17  and oxygen saturation is 100%.   Wt Readings from Last 3 Encounters:  02/22/20 214 lb (97.1 kg)  01/25/20 217 lb (98.4 kg)  12/28/19 212 lb 1.3 oz (96.2 kg)   Physical Exam Vitals reviewed.  HENT:     Head: Normocephalic and atraumatic.  Eyes:     Pupils: Pupils are equal, round, and reactive to light.  Cardiovascular:     Rate and Rhythm: Normal rate and regular rhythm.     Heart sounds: Normal heart sounds.  Pulmonary:     Effort: Pulmonary effort is normal.     Breath sounds: Normal breath sounds.  Abdominal:     General: Bowel sounds are normal.     Palpations: Abdomen is soft.  Musculoskeletal:        General: No tenderness or deformity. Normal range of motion.     Cervical back: Normal range of motion.    Lymphadenopathy:     Cervical: No cervical adenopathy.  Skin:    General: Skin is warm and dry.     Findings: No erythema or rash.  Neurological:     Mental Status: He is alert and oriented to person, place, and time.  Psychiatric:        Behavior: Behavior normal.        Thought Content: Thought content normal.        Judgment: Judgment normal.      Lab Results  Component Value Date   WBC 3.1 (L) 02/22/2020   HGB 13.4 02/22/2020   HCT 38.1 (L) 02/22/2020   MCV 102.1 (H) 02/22/2020   PLT 131 (L) 02/22/2020   Lab Results  Component Value Date   FERRITIN 1,263 (H) 07/20/2014   IRON 125 07/20/2014   TIBC 198 (L) 07/20/2014   UIBC 73 (L) 07/20/2014   IRONPCTSAT 63 (H) 07/20/2014   Lab Results  Component Value Date   RETICCTPCT 0.8 07/20/2014   RBC 3.73 (L) 02/22/2020   Lab Results  Component Value Date   KPAFRELGTCHN 47.4 (H) 01/25/2020   LAMBDASER 29.3 (H) 01/25/2020   KAPLAMBRATIO 1.62 01/25/2020   Lab Results  Component Value Date   IGGSERUM 1,291 01/25/2020   IGA 221 01/25/2020   IGMSERUM 18 (L) 01/25/2020   Lab Results  Component Value Date   TOTALPROTELP 6.6 01/25/2020   ALBUMINELP 3.8 01/25/2020   A1GS 0.2 01/25/2020   A2GS 0.5 01/25/2020   BETS 0.9 01/25/2020   BETA2SER 0.3 03/28/2015   GAMS 1.2 01/25/2020   MSPIKE Not Observed 01/25/2020   SPEI Comment 01/25/2020     Chemistry      Component Value Date/Time   NA 138 02/22/2020 1041   NA 140 04/22/2017 1140   NA 138 09/03/2015 1042   K 4.2 02/22/2020 1041   K 4.0 04/22/2017 1140   K 4.3 09/03/2015 1042   CL 105 02/22/2020 1041   CL 105 04/22/2017 1140   CO2 27 02/22/2020 1041   CO2 24 04/22/2017 1140   CO2 25 09/03/2015 1042   BUN 11 02/22/2020 1041   BUN 16 04/22/2017 1140   BUN 21.7 09/03/2015 1042   CREATININE 1.36 (H) 02/22/2020 1041   CREATININE 1.6 (H) 04/22/2017 1140   CREATININE 1.2 09/03/2015 1042      Component Value Date/Time   CALCIUM 9.5 02/22/2020 1041   CALCIUM  8.5 04/22/2017 1140   CALCIUM 9.1 09/03/2015 1042   ALKPHOS 55 02/22/2020 1041   ALKPHOS 59 04/22/2017 1140   ALKPHOS 42 09/03/2015 1042  AST 26 02/22/2020 1041   AST 27 09/03/2015 1042   ALT 43 02/22/2020 1041   ALT 45 04/22/2017 1140   ALT 41 09/03/2015 1042   BILITOT 0.7 02/22/2020 1041   BILITOT 0.54 09/03/2015 1042       Impression and Plan: [name] is a very pleasant 54 yo caucasian gentleman withIgG kappa myeloma. He underwent induction chemotherapy with RVD followed by an autologous stem cell transplant at [facility] on February 2017.  We are still watching his Kappa light chain closely.  Hopefully, there will still be stable so we do not have to worry about making a change in his protocol.  I think that as long as he is active as he is, we should not have to make any adjustments.  I see him back every 6 weeks.  He gets treated every 2 weeks with Velcade.    [name], MD 11/5/202112:06 PM

## 2020-02-22 NOTE — Patient Instructions (Signed)
[facility] Discharge Instructions for Patients Receiving Chemotherapy  Today you received the following chemotherapy agents Velcade To help prevent nausea and vomiting after your treatment, we encourage you to take your nausea medication as prescribed.   If you develop nausea and vomiting that is not controlled by your nausea medication, call the clinic.   BELOW ARE SYMPTOMS THAT SHOULD BE REPORTED IMMEDIATELY:  *FEVER GREATER THAN 100.5 F  *CHILLS WITH OR WITHOUT FEVER  NAUSEA AND VOMITING THAT IS NOT CONTROLLED WITH YOUR NAUSEA MEDICATION  *UNUSUAL SHORTNESS OF BREATH  *UNUSUAL BRUISING OR BLEEDING  TENDERNESS IN MOUTH AND THROAT WITH OR WITHOUT PRESENCE OF ULCERS  *URINARY PROBLEMS  *BOWEL PROBLEMS  UNUSUAL RASH Items with * indicate a potential emergency and should be followed up as soon as possible.  Feel free to call the clinic should you have any questions or concerns. The clinic phone number is [phone].  Please show the CHEMO ALERT CARD at check-in to the Emergency Department and triage nurse.   

## 2020-02-22 NOTE — Progress Notes (Signed)
Pt discharged in no apparent distress. Pt left ambulatory without assistance. Pt aware of discharge instructions and verbalized understanding and had no further questions.  

## 2020-02-23 LAB — IGG, IGA, IGM
IgA: 234 mg/dL (ref 90–386)
IgG (Immunoglobin G), Serum: 1342 mg/dL (ref 603–1613)
IgM (Immunoglobulin M), Srm: 21 mg/dL (ref 20–172)

## 2020-02-25 LAB — PROTEIN ELECTROPHORESIS, SERUM, WITH REFLEX
A/G Ratio: 1.3 (ref 0.7–1.7)
Albumin ELP: 4 g/dL (ref 2.9–4.4)
Alpha-1-Globulin: 0.2 g/dL (ref 0.0–0.4)
Alpha-2-Globulin: 0.6 g/dL (ref 0.4–1.0)
Beta Globulin: 0.9 g/dL (ref 0.7–1.3)
Gamma Globulin: 1.4 g/dL (ref 0.4–1.8)
Globulin, Total: 3.1 g/dL (ref 2.2–3.9)
Total Protein ELP: 7.1 g/dL (ref 6.0–8.5)

## 2020-02-25 LAB — KAPPA/LAMBDA LIGHT CHAINS
Kappa free light chain: 50.4 mg/L — ABNORMAL HIGH (ref 3.3–19.4)
Kappa, lambda light chain ratio: 1.58 (ref 0.26–1.65)
Lambda free light chains: 31.8 mg/L — ABNORMAL HIGH (ref 5.7–26.3)

## 2020-03-01 ENCOUNTER — Other Ambulatory Visit: Payer: Self-pay

## 2020-03-01 ENCOUNTER — Emergency Department (HOSPITAL_COMMUNITY): Payer: Medicare Other

## 2020-03-01 ENCOUNTER — Inpatient Hospital Stay (HOSPITAL_COMMUNITY)
Admission: EM | Admit: 2020-03-01 | Discharge: 2020-03-03 | DRG: 194 | Disposition: A | Discharge status: Home / Self Care | Payer: Medicare Other | Attending: Family Medicine | Admitting: Family Medicine

## 2020-03-01 ENCOUNTER — Encounter (HOSPITAL_COMMUNITY): Payer: Self-pay | Admitting: Emergency Medicine

## 2020-03-01 DIAGNOSIS — Z20822 Contact with and (suspected) exposure to covid-19: Secondary | ICD-10-CM | POA: Diagnosis present

## 2020-03-01 DIAGNOSIS — R5081 Fever presenting with conditions classified elsewhere: Secondary | ICD-10-CM | POA: Diagnosis present

## 2020-03-01 DIAGNOSIS — Z8619 Personal history of other infectious and parasitic diseases: Secondary | ICD-10-CM | POA: Diagnosis not present

## 2020-03-01 DIAGNOSIS — E538 Deficiency of other specified B group vitamins: Secondary | ICD-10-CM | POA: Diagnosis present

## 2020-03-01 DIAGNOSIS — D701 Agranulocytosis secondary to cancer chemotherapy: Secondary | ICD-10-CM | POA: Diagnosis present

## 2020-03-01 DIAGNOSIS — E871 Hypo-osmolality and hyponatremia: Secondary | ICD-10-CM

## 2020-03-01 DIAGNOSIS — T451X5A Adverse effect of antineoplastic and immunosuppressive drugs, initial encounter: Secondary | ICD-10-CM | POA: Diagnosis present

## 2020-03-01 DIAGNOSIS — D539 Nutritional anemia, unspecified: Secondary | ICD-10-CM

## 2020-03-01 DIAGNOSIS — D709 Neutropenia, unspecified: Secondary | ICD-10-CM

## 2020-03-01 DIAGNOSIS — C9001 Multiple myeloma in remission: Secondary | ICD-10-CM | POA: Diagnosis present

## 2020-03-01 DIAGNOSIS — E860 Dehydration: Secondary | ICD-10-CM

## 2020-03-01 DIAGNOSIS — D702 Other drug-induced agranulocytosis: Secondary | ICD-10-CM | POA: Diagnosis present

## 2020-03-01 DIAGNOSIS — Z79899 Other long term (current) drug therapy: Secondary | ICD-10-CM

## 2020-03-01 DIAGNOSIS — A419 Sepsis, unspecified organism: Secondary | ICD-10-CM

## 2020-03-01 DIAGNOSIS — D6959 Other secondary thrombocytopenia: Secondary | ICD-10-CM | POA: Diagnosis present

## 2020-03-01 DIAGNOSIS — J189 Pneumonia, unspecified organism: Secondary | ICD-10-CM | POA: Diagnosis not present

## 2020-03-01 DIAGNOSIS — R509 Fever, unspecified: Secondary | ICD-10-CM | POA: Diagnosis present

## 2020-03-01 DIAGNOSIS — N179 Acute kidney failure, unspecified: Secondary | ICD-10-CM | POA: Diagnosis present

## 2020-03-01 DIAGNOSIS — Y95 Nosocomial condition: Secondary | ICD-10-CM | POA: Diagnosis present

## 2020-03-01 LAB — URINALYSIS, ROUTINE W REFLEX MICROSCOPIC
Bilirubin Urine: NEGATIVE
Glucose, UA: NEGATIVE mg/dL
Hgb urine dipstick: NEGATIVE
Ketones, ur: NEGATIVE mg/dL
Leukocytes,Ua: NEGATIVE
Nitrite: NEGATIVE
Protein, ur: 30 mg/dL — AB
Specific Gravity, Urine: 1.012 (ref 1.005–1.030)
pH: 5 (ref 5.0–8.0)

## 2020-03-01 LAB — CBC WITH DIFFERENTIAL/PLATELET
Abs Immature Granulocytes: 0.01 10*3/uL (ref 0.00–0.07)
Basophils Absolute: 0 10*3/uL (ref 0.0–0.1)
Basophils Relative: 0 %
Eosinophils Absolute: 0 10*3/uL (ref 0.0–0.5)
Eosinophils Relative: 0 %
HCT: 38 % — ABNORMAL LOW (ref 39.0–52.0)
Hemoglobin: 13.4 g/dL (ref 13.0–17.0)
Immature Granulocytes: 0 %
Lymphocytes Relative: 20 %
Lymphs Abs: 0.5 10*3/uL — ABNORMAL LOW (ref 0.7–4.0)
MCH: 36.4 pg — ABNORMAL HIGH (ref 26.0–34.0)
MCHC: 35.3 g/dL (ref 30.0–36.0)
MCV: 103.3 fL — ABNORMAL HIGH (ref 80.0–100.0)
Monocytes Absolute: 0.3 10*3/uL (ref 0.1–1.0)
Monocytes Relative: 12 %
Neutro Abs: 1.7 10*3/uL (ref 1.7–7.7)
Neutrophils Relative %: 68 %
Platelets: 111 10*3/uL — ABNORMAL LOW (ref 150–400)
RBC: 3.68 MIL/uL — ABNORMAL LOW (ref 4.22–5.81)
RDW: 12.2 % (ref 11.5–15.5)
WBC: 2.5 10*3/uL — ABNORMAL LOW (ref 4.0–10.5)
nRBC: 0 % (ref 0.0–0.2)

## 2020-03-01 LAB — RESPIRATORY PANEL BY RT PCR (FLU A&B, COVID)
Influenza A by PCR: NEGATIVE
Influenza B by PCR: NEGATIVE
SARS Coronavirus 2 by RT PCR: NEGATIVE

## 2020-03-01 LAB — COMPREHENSIVE METABOLIC PANEL
ALT: 101 U/L — ABNORMAL HIGH (ref 0–44)
AST: 45 U/L — ABNORMAL HIGH (ref 15–41)
Albumin: 3.6 g/dL (ref 3.5–5.0)
Alkaline Phosphatase: 73 U/L (ref 38–126)
Anion gap: 10 (ref 5–15)
BUN: 19 mg/dL (ref 6–20)
CO2: 19 mmol/L — ABNORMAL LOW (ref 22–32)
Calcium: 8.2 mg/dL — ABNORMAL LOW (ref 8.9–10.3)
Chloride: 101 mmol/L (ref 98–111)
Creatinine, Ser: 2.08 mg/dL — ABNORMAL HIGH (ref 0.61–1.24)
GFR, Estimated: 37 mL/min — ABNORMAL LOW (ref >60)
Glucose, Bld: 118 mg/dL — ABNORMAL HIGH (ref 70–99)
Potassium: 4 mmol/L (ref 3.5–5.1)
Sodium: 130 mmol/L — ABNORMAL LOW (ref 135–145)
Total Bilirubin: 1 mg/dL (ref 0.3–1.2)
Total Protein: 7.6 g/dL (ref 6.5–8.1)

## 2020-03-01 LAB — PROTIME-INR
INR: 1.1 (ref 0.8–1.2)
Prothrombin Time: 14.2 seconds (ref 11.4–15.2)

## 2020-03-01 LAB — HIV ANTIBODY (ROUTINE TESTING W REFLEX): HIV Screen 4th Generation wRfx: NONREACTIVE

## 2020-03-01 LAB — LACTIC ACID, PLASMA
Lactic Acid, Venous: 0.9 mmol/L (ref 0.5–1.9)
Lactic Acid, Venous: 0.7 mmol/L (ref 0.5–1.9)

## 2020-03-01 LAB — APTT: aPTT: 27 seconds (ref 24–36)

## 2020-03-01 LAB — SODIUM: Sodium: 133 mmol/L — ABNORMAL LOW (ref 135–145)

## 2020-03-01 MED ORDER — HEPARIN SODIUM (PORCINE) 5000 UNIT/ML IJ SOLN
5000.0000 [IU] | Freq: Three times a day (TID) | INTRAMUSCULAR | Status: DC
  Administered 2020-03-01 – 2020-03-03 (×5): 5000 [IU] via SUBCUTANEOUS
  Filled 2020-03-01 (×5): qty 1

## 2020-03-01 MED ORDER — VANCOMYCIN HCL 750 MG/150ML IV SOLN
750.0000 mg | Freq: Two times a day (BID) | INTRAVENOUS | Status: DC
  Administered 2020-03-02 – 2020-03-03 (×3): 750 mg via INTRAVENOUS
  Filled 2020-03-01 (×4): qty 150

## 2020-03-01 MED ORDER — SODIUM CHLORIDE 0.9 % IV BOLUS
1000.0000 mL | Freq: Once | INTRAVENOUS | Status: AC
  Administered 2020-03-01: 1000 mL via INTRAVENOUS

## 2020-03-01 MED ORDER — SODIUM CHLORIDE 0.9 % IV SOLN
2.0000 g | Freq: Once | INTRAVENOUS | Status: AC
  Administered 2020-03-01: 2 g via INTRAVENOUS
  Filled 2020-03-01: qty 2

## 2020-03-01 MED ORDER — SODIUM CHLORIDE 0.9 % IV SOLN
2.0000 g | Freq: Two times a day (BID) | INTRAVENOUS | Status: DC
  Administered 2020-03-02 – 2020-03-03 (×3): 2 g via INTRAVENOUS
  Filled 2020-03-01 (×6): qty 2

## 2020-03-01 MED ORDER — CALCIUM CARBONATE 1250 (500 CA) MG PO TABS
1000.0000 mg | ORAL_TABLET | Freq: Every day | ORAL | Status: DC
  Administered 2020-03-02 – 2020-03-03 (×2): 1000 mg via ORAL
  Filled 2020-03-01: qty 2
  Filled 2020-03-01 (×2): qty 1

## 2020-03-01 MED ORDER — ACETAMINOPHEN 325 MG PO TABS
650.0000 mg | ORAL_TABLET | Freq: Once | ORAL | Status: AC | PRN
  Administered 2020-03-01: 650 mg via ORAL
  Filled 2020-03-01: qty 2

## 2020-03-01 MED ORDER — VANCOMYCIN HCL 2000 MG/400ML IV SOLN
2000.0000 mg | Freq: Once | INTRAVENOUS | Status: AC
  Administered 2020-03-01: 2000 mg via INTRAVENOUS
  Filled 2020-03-01: qty 400

## 2020-03-01 NOTE — ED Triage Notes (Signed)
Pt reports fatigue and fever x 4 days.  States he had also been constipated but was able to have BM after taking mag citrate last night.  Negative COVID test on Thursday.  Hx of multiple myeloma.

## 2020-03-01 NOTE — H&P (Signed)
History and Physical  Tyler Phillips. LFY:[id number] DOB: 07-May-1965 DOA: 03/01/2020  Referring physician: Ronnald Nian  PCP: [name], MD  Patient coming from:  Home  Chief Complaint: Fever and generalized weakness  HPI: Tyler Nazir. is a 54 y.o. male with medical history significant for multiple myeloma who presents to the emergency department accompanied by wife due to 4-5 days of recurrent fever with maximum of 102F as well as generalized weakness, chest congestion and mild chest pain.  Patient was concerned that the symptoms may be related to COVID-19 virus, so he had COVID-19 virus test done on Thursday (11/11) that was negative, since the generalized weakness and the fever continued, he decided to go to the ED for further evaluation and management.  Patient denies cough, shortness of breath, nausea, vomiting or abdominal pain.  Patient denies tobacco or recreational drug use.  He drinks alcohol socially.  ED Course:  In the emergency department, patient was febrile with a temperature of 103 F, but other vital signs were within normal range.  Work-up in the ED showed WBC was 2.5, platelets 111, H/H 13.4/38 with MCV of 103.3, hyponatremia, BUN/creatinine 19/2.08 (baseline creatinine at 1.3-1.4).  Urinalysis was unimpressive for UTI.  Respiratory panel for influenza A, B and SARS coronavirus 2 was negative.  Chest x-ray showed right middle lobe pneumonia.  Patient was started on IV cefepime and IV vancomycin.  Hospitalist was asked to admit patient for further evaluation and management.  Review of Systems: Constitutional: Positive for chills and fever.  HENT: Negative for ear pain and sore throat.   Eyes: Negative for pain and visual disturbance.  Respiratory: Positive for chest congestion. Negative for cough and shortness of breath.   Cardiovascular: Positive for chest pain (right-sided).  Negative for palpitations.  Gastrointestinal: Negative for abdominal pain and vomiting.   Endocrine: Negative for polyphagia and polyuria.  Genitourinary: Negative for decreased urine volume, dysuria, enuresis, hematuria Musculoskeletal: Negative for arthralgias and back pain.  Skin: Negative for color change and rash.  Allergic/Immunologic: Negative for immunocompromised state.  Neurological: Negative for tremors, syncope, speech difficulty, weakness, light-headedness and headaches.  Hematological: Does not bruise/bleed easily.  All other systems reviewed and are negative   Past Medical History:  Diagnosis Date  . Cancer ([place])   . Multiple myeloma ([place])    History reviewed. No pertinent surgical history.  Social History:  reports that he has never smoked. He has never used smokeless tobacco. He reports current alcohol use of about 7.0 standard drinks of alcohol per week. He reports that he does not use drugs.   No Known Allergies  No family history on file.   Prior to Admission medications   Medication Sig Start Date End Date Taking? Authorizing Provider  aspirin 325 MG tablet Take 325 mg by mouth daily.   Yes [provider]  bortezomib IV (VELCADE) 3.5 MG injection Inject as directed every 14 (fourteen) days.   Yes [provider]  cetirizine (ZYRTEC) 10 MG tablet Take 10 mg by mouth daily.   Yes [provider]  clindamycin (CLEOCIN) 300 MG capsule Take 300 mg by mouth. Prior to dental procedures. 01/14/20  Yes [provider]  lenalidomide (REVLIMID) 10 MG capsule TAKE 1 CAPSULE BY MOUTH  DAILY FOR 21 DAYS ON, THEN  7 DAYS OFF Josem Kaufmann #[id number] 02/20/20  Yes Ennever, Rudell Cobb, MD  LORazepam (ATIVAN) 1 MG tablet Take 1 tablet (1 mg total) by mouth every 4 (four) hours as needed. Patient taking differently:  Take 1 mg by mouth every 4 (four) hours as needed for anxiety.  01/15/19  Yes Ennever, Rudell Cobb, MD  valACYclovir (VALTREX) 500 MG tablet Take 500 mg by mouth 2 (two) times daily.    Yes [provider]  prochlorperazine  (COMPAZINE) 10 MG tablet Take 10 mg by mouth every 6 (six) hours as needed.    [provider]  zolpidem (AMBIEN) 10 MG tablet Take 1 tablet (10 mg total) by mouth at bedtime as needed for sleep. 01/15/19 06/01/19  [name], MD    Physical Exam: BP (!) 133/97   Pulse 66   Temp (!) 103 F (39.4 C) (Oral)   Resp 19   Ht _0  (1.854 m)   Wt 97.5 kg   SpO2 100%   BMI 28.37 kg/m   . General: 54 y.o. year-old male well developed well nourished in no acute distress.  Alert and oriented x3. Marland Kitchen HEENT: Dry mucous membrane.  NCAT, EOMI . Neck: Supple, trachea media . Cardiovascular: Regular rate and rhythm with no rubs or gallops.  No thyromegaly or JVD noted.  No lower extremity edema. 2/4 pulses in all 4 extremities. Marland Kitchen Respiratory: Decreased breath sounds in right middle and lower lobes. No wheezes or rales.  . Abdomen: Soft nontender nondistended with normal bowel sounds x4 quadrants. . Muskuloskeletal: No cyanosis, clubbing or edema noted bilaterally . Neuro: CN II-XII intact, strength, sensation, reflexes . Skin: No ulcerative lesions noted or rashes . Psychiatry: Judgement and insight appear normal. Mood is appropriate for condition and setting          Labs on Admission:  Basic Metabolic Panel: Recent Labs  Lab 03/01/20 1407  NA 130*  K 4.0  CL 101  CO2 19*  GLUCOSE 118*  BUN 19  CREATININE 2.08*  CALCIUM 8.2*   Liver Function Tests: Recent Labs  Lab 03/01/20 1407  AST 45*  ALT 101*  ALKPHOS 73  BILITOT 1.0  PROT 7.6  ALBUMIN 3.6   No results for input(s): LIPASE, AMYLASE in the last 168 hours. No results for input(s): AMMONIA in the last 168 hours. CBC: Recent Labs  Lab 03/01/20 1407  WBC 2.5*  NEUTROABS 1.7  HGB 13.4  HCT 38.0*  MCV 103.3*  PLT 111*   Cardiac Enzymes: No results for input(s): CKTOTAL, CKMB, CKMBINDEX, TROPONINI in the last 168 hours.  BNP (last 3 results) No results for input(s): BNP in the last 8760 hours.  ProBNP  (last 3 results) No results for input(s): PROBNP in the last 8760 hours.  CBG: No results for input(s): GLUCAP in the last 168 hours.  Radiological Exams on Admission: DG Chest 2 View  Result Date: 03/01/2020 CLINICAL DATA:  Fever EXAM: CHEST - 2 VIEW COMPARISON:  August 08, 2014 FINDINGS: The cardiomediastinal silhouette is unchanged in contour. No pleural effusion. No pneumothorax. There is a RIGHT middle lobe consolidative opacity. Visualized abdomen is unremarkable. Mild degenerative changes of the thoracic spine. IMPRESSION: RIGHT middle lobe pneumonia. Followup PA and lateral chest X-ray is recommended in 3-4 weeks following trial of antibiotic therapy to ensure resolution and exclude underlying malignancy. Electronically Signed   By: Valentino Saxon MD   On: 03/01/2020 14:12    EKG: I independently viewed the EKG done and my findings are as followed: Normal sinus rhythm at rate of 88 bpm  Assessment/Plan Present on Admission: . Pneumonia . Multiple myeloma in remission ([place]) . Drug-induced neutropenia ([place]) . Chemotherapy-induced thrombocytopenia . History of herpes  zoster  Principal Problem:   Pneumonia Active Problems:   Multiple myeloma in remission ([facility])   Drug-induced neutropenia ([facility])   Chemotherapy-induced thrombocytopenia   History of herpes zoster   Macrocytic anemia   Hypocalcemia   Hyponatremia   AKI (acute kidney injury) ([place])   Neutropenia with fever ([facility])   Dehydration   HCAP POA Chest x-ray showed right middle lobe pneumonia PORT/PSI of 85 points  indicating 0.9-2.8% mortality Continue Mucinex, cefepime, vancomycin Continue  incentive spirometry, flutter valve  Blood culture and sputum culture pending Patient does not meet sepsis criteria, he was febrile, and though patient was leukopenic, this appears to be due to chemotherapy induced.  Neutropenia (chemotherapy induced) WBC 2.5, this has ranged within 2.7-3.1 within last 2 months Patient on  bortezomib 1 week last administration on 11/5 Continue to monitor WBC with morning labs  Hyponatremia/dehydration Na 130, this is possibly related to dehydration  Continue IV hydration Urine osmolality and urine sodium will be checked  Hypocalcemia Calcium 8.2, continue Os-Cal  Chemotherapy-induced thrombocytopenia Platelets 118, continue to monitor platelets with morning labs  Macrocytic anemia MCV 103.3, vitamin B12 and folate level will be checked  Multiple myeloma Patient takes bortezomib, last dose 11/5  History of Herpes zoster Continue home Valtrex   DVT prophylaxis: Heparin subcu  Code Status: Full code  Family Communication: Wife at bedside (all questions answered to satisfaction)  Disposition Plan:  Patient is from:                        home Anticipated DC to:                   home Anticipated DC date:               2-3 days Anticipated DC barriers:          Patient cannot be discharged at this time due to pneumonia requiring IV antibiotics at this time   Consults called: None  Admission status: Inpatient    Bernadette Hoit MD [facility]  03/01/2020, 6:03 PM

## 2020-03-01 NOTE — ED Notes (Signed)
While in restroom, wife of patient privately wanted it noted that patient has been slightly more confused than normal. She states that he has been forgetting names that he knows regularly and has also had an increased difficulty when driving. Examples include cutting corners too close or bumping into the bumpers in a parking spot. She states these moments are subtle but she has noticed them.

## 2020-03-01 NOTE — ED Provider Notes (Addendum)
[facility] EMERGENCY DEPARTMENT Provider Note   CSN: [id number] Arrival date & time: 03/01/20  1309     History Chief Complaint  Patient presents with  . Fatigue  . Fever    Tyler Phillips. is a 54 y.o. male.  The history is provided by the patient.  Fever Temp source:  Oral Severity:  Mild Onset quality:  Gradual Duration:  4 days Timing:  Constant Progression:  Unchanged Chronicity:  New Relieved by:  Nothing Worsened by:  Nothing Associated symptoms: chest pain (right sided) and chills   Associated symptoms: no cough, no dysuria, no ear pain, no rash, no sore throat and no vomiting   Risk factors: hx of cancer and immunosuppression (maintenance chemo for multiple myeloma. Last infusion about a week ago)        Past Medical History:  Diagnosis Date  . Cancer ([place])   . Multiple myeloma [facility])     Patient Active Problem List   Diagnosis Date Noted  . Chemotherapy-induced neuropathy ([place]) 03/28/2018  . 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 C. difficile colitis 06/01/2015  . Clostridium difficile colitis 06/01/2015  . Acquired pancytopenia ([place]) 05/30/2015  . Antineoplastic chemotherapy induced pancytopenia ([place]) 05/30/2015  . Personal history of other infectious and parasitic diseases 05/06/2015  . 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 History of herpes zoster 05/06/2015  . Encounter for examination for normal comparison or control in clinical research program 05/02/2015  . Patient in clinical research study 05/02/2015  . Preseptal cellulitis 04/27/2015  . Drug-induced neutropenia ([place]) 04/27/2015  . Chemotherapy-induced thrombocytopenia 04/27/2015  . Antineoplastic chemotherapy induced anemia 04/27/2015  . IgG multiple myeloma ([place]) 04/03/2015  . CKD (chronic kidney disease) stage 2, GFR 60-89 ml/min 07/20/2014  . Multiple myeloma in remission ([place]) 07/15/2014    History reviewed. No  pertinent surgical history.     No family history on file.  Social History   Tobacco Use  . Smoking status: Never Smoker  . Smokeless tobacco: Never Used  . Tobacco comment: NEVER USED TOBACCO  Vaping Use  . Vaping Use: Never used  Substance Use Topics  . Alcohol use: Yes    Alcohol/week: 7.0 standard drinks    Types: 7 Glasses of wine per week  . Drug use: No    Home Medications Prior to Admission medications   Medication Sig Start Date End Date Taking? Authorizing Provider  aspirin 325 MG tablet Take 325 mg by mouth daily.   Yes [provider]  bortezomib IV (VELCADE) 3.5 MG injection Inject as directed every 14 (fourteen) days.   Yes [provider]  cetirizine (ZYRTEC) 10 MG tablet Take 10 mg by mouth daily.   Yes [provider]  clindamycin (CLEOCIN) 300 MG capsule Take 300 mg by mouth. Prior to dental procedures. 01/14/20  Yes [provider]  lenalidomide (REVLIMID) 10 MG capsule TAKE 1 CAPSULE BY MOUTH  DAILY FOR 21 DAYS ON, THEN  7 DAYS OFF Josem Kaufmann #[id number] 02/20/20  Yes Ennever, Rudell Cobb, MD  LORazepam (ATIVAN) 1 MG tablet Take 1 tablet (1 mg total) by mouth every 4 (four) hours as needed. Patient taking differently: Take 1 mg by mouth every 4 (four) hours as needed for anxiety.  01/15/19  Yes Ennever, Rudell Cobb, MD  valACYclovir (VALTREX) 500 MG tablet Take 500 mg by mouth 2 (two) times daily.    Yes [provider]  prochlorperazine (COMPAZINE) 10 MG tablet Take 10 mg by  mouth every 6 (six) hours as needed.    [provider]  zolpidem (AMBIEN) 10 MG tablet Take 1 tablet (10 mg total) by mouth at bedtime as needed for sleep. 01/15/19 06/01/19  [name], MD    Allergies    Patient has no known allergies.  Review of Systems   Review of Systems  Constitutional: Positive for chills and fever.  HENT: Negative for ear pain and sore throat.   Eyes: Negative for pain and visual disturbance.  Respiratory: Negative for  cough and shortness of breath.   Cardiovascular: Positive for chest pain (right sided). Negative for palpitations.  Gastrointestinal: Negative for abdominal pain and vomiting.  Genitourinary: Negative for dysuria and hematuria.  Musculoskeletal: Negative for arthralgias and back pain.  Skin: Negative for color change and rash.  Neurological: Negative for seizures and syncope.  All other systems reviewed and are negative.   Physical Exam Updated Vital Signs BP 137/61   Pulse 66   Temp (!) 103 F (39.4 C) (Oral)   Resp 16   Ht '6\' 1"'  (1.854 m)   Wt 97.5 kg   SpO2 100%   BMI 28.37 kg/m   Physical Exam Vitals and nursing note reviewed.  Constitutional:      Appearance: He is well-developed. He is not ill-appearing.  HENT:     Head: Normocephalic and atraumatic.     Nose: Nose normal.     Mouth/Throat:     Mouth: Mucous membranes are moist.  Eyes:     Extraocular Movements: Extraocular movements intact.     Conjunctiva/sclera: Conjunctivae normal.     Pupils: Pupils are equal, round, and reactive to light.  Cardiovascular:     Rate and Rhythm: Normal rate and regular rhythm.     Pulses: Normal pulses.     Heart sounds: Normal heart sounds. No murmur heard.   Pulmonary:     Effort: Pulmonary effort is normal. No respiratory distress.     Breath sounds: Normal breath sounds.     Comments: Diminish breath sounds over right side lungs  Abdominal:     Palpations: Abdomen is soft.     Tenderness: There is no abdominal tenderness.  Musculoskeletal:     Cervical back: Neck supple.  Skin:    General: Skin is warm and dry.     Capillary Refill: Capillary refill takes less than 2 seconds.  Neurological:     General: No focal deficit present.     Mental Status: He is alert.  Psychiatric:        Mood and Affect: Mood normal.     ED Results / Procedures / Treatments   Labs (all labs ordered are listed, but only abnormal results are displayed) Labs Reviewed  COMPREHENSIVE  METABOLIC PANEL - Abnormal; Notable for the following components:      Result Value   Sodium 130 (*)    CO2 19 (*)    Glucose, Bld 118 (*)    Creatinine, Ser 2.08 (*)    Calcium 8.2 (*)    AST 45 (*)    ALT 101 (*)    GFR, Estimated 37 (*)    All other components within normal limits  CBC WITH DIFFERENTIAL/PLATELET - Abnormal; Notable for the following components:   WBC 2.5 (*)    RBC 3.68 (*)    HCT 38.0 (*)    MCV 103.3 (*)    MCH 36.4 (*)    Platelets 111 (*)    Lymphs Abs 0.5 (*)  All other components within normal limits  RESPIRATORY PANEL BY RT PCR (FLU A&B, COVID)  CULTURE, BLOOD (ROUTINE X 2)  CULTURE, BLOOD (ROUTINE X 2)  CULTURE, BLOOD (SINGLE)  URINE CULTURE  LACTIC ACID, PLASMA  LACTIC ACID, PLASMA  PROTIME-INR  APTT  URINALYSIS, ROUTINE W REFLEX MICROSCOPIC    EKG EKG Interpretation  Date/Time:  Saturday March 01 2020 13:39:16 EST Ventricular Rate:  88 PR Interval:  148 QRS Duration: 92 QT Interval:  372 QTC Calculation: 450 R Axis:   6 Text Interpretation: Normal sinus rhythm Normal ECG Confirmed by Lennice Sites [id number]) on 03/01/2020 2:39:07 PM   Radiology DG Chest 2 View  Result Date: 03/01/2020 CLINICAL DATA:  Fever EXAM: CHEST - 2 VIEW COMPARISON:  August 08, 2014 FINDINGS: The cardiomediastinal silhouette is unchanged in contour. No pleural effusion. No pneumothorax. There is a RIGHT middle lobe consolidative opacity. Visualized abdomen is unremarkable. Mild degenerative changes of the thoracic spine. IMPRESSION: RIGHT middle lobe pneumonia. Followup PA and lateral chest X-ray is recommended in 3-4 weeks following trial of antibiotic therapy to ensure resolution and exclude underlying malignancy. Electronically Signed   By: Valentino Saxon MD   On: 03/01/2020 14:12    Procedures .Critical Care Performed by: Lennice Sites, DO Authorized by: Lennice Sites, DO   Critical care provider statement:    Critical care time (minutes):  35    Critical care was necessary to treat or prevent imminent or life-threatening deterioration of the following conditions:  Sepsis   Critical care was time spent personally by me on the following activities:  Blood draw for specimens, discussions with consultants, discussions with primary provider, examination of patient, ordering and performing treatments and interventions, ordering and review of laboratory studies, ordering and review of radiographic studies, pulse oximetry, re-evaluation of patient's condition and review of old charts   I assumed direction of critical care for this patient from another provider in my specialty: no     (including critical care time)  Medications Ordered in ED Medications  vancomycin (VANCOREADY) IVPB 2000 mg/400 mL (2,000 mg Intravenous New Bag/Given 03/01/20 1541)  ceFEPIme (MAXIPIME) 2 g in sodium chloride 0.9 % 100 mL IVPB (has no administration in time range)  vancomycin (VANCOREADY) IVPB 750 mg/150 mL (has no administration in time range)  acetaminophen (TYLENOL) tablet 650 mg (650 mg Oral Given 03/01/20 1350)  sodium chloride 0.9 % bolus 1,000 mL (1,000 mLs Intravenous New Bag/Given 03/01/20 1503)  ceFEPIme (MAXIPIME) 2 g in sodium chloride 0.9 % 100 mL IVPB (0 g Intravenous Stopped 03/01/20 1541)    ED Course  I have reviewed the triage vital signs and the nursing notes.  Pertinent labs & imaging results that were available during my care of the patient were reviewed by me and considered in my medical decision making (see chart for details).    MDM Rules/Calculators/A&P                          Hardeep Reetz. is a 54 year old male with history of multiple myeloma undergoing maintenance chemotherapy who presents the ED with fever.  Patient febrile mildly tachycardic upon arrival.  Last infusion for chemotherapy maintenance was last week.  Has had fever for several days and some right-sided chest pain but no obvious cough or sputum production.  No  urinary symptoms.  X-ray does show right middle lobe pneumonia.  Will give IV antibiotics and pursue sepsis work-up.  Given his immunosuppression anticipate admission  for IV antibiotics and following up cultures.  With amply stable at this time.  Will give Tylenol and fluid bolus as well.  Patient with white count of 2.5 but absolute neutrophils at 1.7. No significant leukocytosis. Mild AKI with a creatinine at 2.08. Vital signs are stable. Overall high risk for bacteremia does have a mild AKI. Borderline neutropenic. Will admit for further IV antibiotics and monitoring of blood cultures. Technically meets sepsis criteria.  This chart was dictated using voice recognition software.  Despite best efforts to proofread,  errors can occur which can change the documentation meaning.    Final Clinical Impression(s) / ED Diagnoses Final diagnoses:  Community acquired pneumonia of right lung, unspecified part of lung  Sepsis, due to unspecified organism, unspecified whether acute organ dysfunction present [facility])    Rx / DC Orders ED Discharge Orders    None       Lennice Sites, DO 03/01/20 [place], [place], DO 03/01/20 1624

## 2020-03-01 NOTE — Progress Notes (Signed)
Pharmacy Antibiotic Note  Tyler Marti. is a 54 y.o. male admitted on 03/01/2020 with sepsis.  Pharmacy has been consulted for vancomycin and cefepime dosing. Pt is febrile with Tmax 101 and WBC is low at 2.5. SCr is elevated above baselien at 2.09. Lactic acid is normal.   Plan: Vancomycin 2gm IV x 1 then 750mg  IV Q12H Cefepime 2gm IV Q12H F/u renal fxn, C&S, clinical status and trough at SS  Height: 6\' 1"  (185.4 cm) Weight: 97.5 kg (215 lb) IBW/kg (Calculated) : 79.9  Temp (24hrs), Avg:103 F (39.4 C), Min:103 F (39.4 C), Max:103 F (39.4 C)  Recent Labs  Lab 03/01/20 1407 03/01/20 1440  WBC 2.5*  --   CREATININE 2.08*  --   LATICACIDVEN 0.9 0.7    Estimated Creatinine Clearance: 49.3 mL/min (A) (by C-G formula based on SCr of 2.08 mg/dL (H)).    No Known Allergies  Antimicrobials this admission: Vanc 11/13>> Cefepime 11/13>>  Dose adjustments this admission: N/A  Microbiology results: Pending  Thank you for allowing pharmacy to be a part of this patient's care.  Tyler Phillips, Rande Lawman 03/01/2020 2:40 PM

## 2020-03-02 DIAGNOSIS — J189 Pneumonia, unspecified organism: Principal | ICD-10-CM

## 2020-03-02 LAB — CBC
HCT: 34.9 % — ABNORMAL LOW (ref 39.0–52.0)
Hemoglobin: 12.3 g/dL — ABNORMAL LOW (ref 13.0–17.0)
MCH: 36.4 pg — ABNORMAL HIGH (ref 26.0–34.0)
MCHC: 35.2 g/dL (ref 30.0–36.0)
MCV: 103.3 fL — ABNORMAL HIGH (ref 80.0–100.0)
Platelets: 101 10*3/uL — ABNORMAL LOW (ref 150–400)
RBC: 3.38 MIL/uL — ABNORMAL LOW (ref 4.22–5.81)
RDW: 12.4 % (ref 11.5–15.5)
WBC: 1.8 10*3/uL — ABNORMAL LOW (ref 4.0–10.5)
nRBC: 0 % (ref 0.0–0.2)

## 2020-03-02 LAB — URINE CULTURE: Culture: NO GROWTH

## 2020-03-02 LAB — SODIUM, URINE, RANDOM: Sodium, Ur: <10 mmol/L

## 2020-03-02 LAB — FOLATE: Folate: 26 ng/mL (ref >5.9)

## 2020-03-02 LAB — VITAMIN B12: Vitamin B-12: 123 pg/mL — ABNORMAL LOW (ref 180–914)

## 2020-03-02 LAB — PROTIME-INR
INR: 1.1 (ref 0.8–1.2)
Prothrombin Time: 14.1 seconds (ref 11.4–15.2)

## 2020-03-02 LAB — MAGNESIUM: Magnesium: 2.2 mg/dL (ref 1.7–2.4)

## 2020-03-02 LAB — APTT: aPTT: 28 seconds (ref 24–36)

## 2020-03-02 LAB — OSMOLALITY, URINE: Osmolality, Ur: 339 mOsm/kg (ref 300–900)

## 2020-03-02 LAB — PHOSPHORUS: Phosphorus: 2.3 mg/dL — ABNORMAL LOW (ref 2.5–4.6)

## 2020-03-02 MED ORDER — ASPIRIN 325 MG PO TABS
325.0000 mg | ORAL_TABLET | Freq: Every day | ORAL | Status: DC
  Administered 2020-03-02 – 2020-03-03 (×2): 325 mg via ORAL
  Filled 2020-03-02 (×2): qty 1

## 2020-03-02 MED ORDER — POTASSIUM & SODIUM PHOSPHATES 280-160-250 MG PO PACK
1.0000 | PACK | Freq: Three times a day (TID) | ORAL | Status: DC
  Administered 2020-03-02 – 2020-03-03 (×4): 1 via ORAL
  Filled 2020-03-02 (×6): qty 1

## 2020-03-02 MED ORDER — LOPERAMIDE HCL 2 MG PO CAPS: 2.0000 mg | ORAL_CAPSULE | ORAL | Status: DC | PRN

## 2020-03-02 MED ORDER — CYANOCOBALAMIN 500 MCG PO TABS
250.0000 ug | ORAL_TABLET | Freq: Every day | ORAL | Status: DC
  Administered 2020-03-02 – 2020-03-03 (×2): 250 ug via ORAL
  Filled 2020-03-02 (×2): qty 1

## 2020-03-02 MED ORDER — VALACYCLOVIR HCL 500 MG PO TABS
500.0000 mg | ORAL_TABLET | Freq: Two times a day (BID) | ORAL | Status: DC
  Administered 2020-03-02 – 2020-03-03 (×3): 500 mg via ORAL
  Filled 2020-03-02 (×3): qty 1

## 2020-03-02 NOTE — Progress Notes (Signed)
PROGRESS NOTE    Tyler Phillips.  KAJ:[id number] DOB: [birth year] DOA: 03/01/2020 PCP: [name], MD   Brief Narrative:  HPI: Tyler Radford. is a 54 y.o. male with medical history significant for multiple myeloma who presents to the emergency department accompanied by wife due to 4-5 days of recurrent fever with maximum of 102F as well as generalized weakness, chest congestion and mild chest pain.  Patient was concerned that the symptoms may be related to COVID-19 virus, so he had COVID-19 virus test done on Thursday (11/11) that was negative, since the generalized weakness and the fever continued, he decided to go to the ED for further evaluation and management.  Patient denies cough, shortness of breath, nausea, vomiting or abdominal pain.  Patient denies tobacco or recreational drug use.  He drinks alcohol socially.  ED Course:  In the emergency department, patient was febrile with a temperature of 103 F, but other vital signs were within normal range.  Work-up in the ED showed WBC was 2.5, platelets 111, H/H 13.4/38 with MCV of 103.3, hyponatremia, BUN/creatinine 19/2.08 (baseline creatinine at 1.3-1.4).  Urinalysis was unimpressive for UTI.  Respiratory panel for influenza A, B and SARS coronavirus 2 was negative.  Chest x-ray showed right middle lobe pneumonia.  Patient was started on IV cefepime and IV vancomycin.  Hospitalist was asked to admit patient for further evaluation and management.  Assessment & Plan:   Principal Problem:   Pneumonia Active Problems:   Multiple myeloma in remission (HCC)   Drug-induced neutropenia (HCC)   Chemotherapy-induced thrombocytopenia   History of herpes zoster   Macrocytic anemia   Hypocalcemia   Hyponatremia   AKI (acute kidney injury) ([place])   Neutropenia with fever (HCC)   Dehydration   HCAP POA: Presented with only fever.  Known leukopenia secondary to chemotherapy.  Does not meet sepsis criteria. Chest x-ray showed right middle  lobe pneumonia. Continue Mucinex, cefepime, vancomycin,  incentive spirometry, flutter valve. Blood culture and sputum culture pending.  Last known fever 11 PM 03/01/2020.  Neutropenia (chemotherapy induced) WBC 2.5, this has ranged within 2.7-3.1 within last 2 months.  Currently 1.8. Patient on bortezomib 1 week last administration on 11/5 Continue to monitor WBC with morning labs  Hyponatremia/dehydration: Presented with sodium of 130 which improved to 133.  Continue IV hydration.  Check daily.  Hypocalcemia: Calcium 8.2, continue Os-Cal  Hypophosphatemia: We will replace.  Chemotherapy-induced thrombocytopenia: Stable.  No signs of bleeding.  Monitor.  Macrocytic anemia /B12 deficiency: Hemoglobin is stable.  Will start on oral B12 supplement.  Multiple myeloma Patient takes bortezomib, last dose 11/5  History of Herpes zoster Continue home Valtrex  DVT prophylaxis: heparin injection 5,000 Units Start: 03/01/20 2200 SCDs Start: 03/01/20 1851   Code Status: Full Code  Family Communication: None present at bedside.  Plan of care discussed with patient in length and he verbalized understanding and agreed with it.  Status is: Inpatient  Remains inpatient appropriate because:Inpatient level of care appropriate due to severity of illness   Dispo: The patient is from: Home              Anticipated d/c is to: Home              Anticipated d/c date is: 1 day, patient feeling better but patient last known fever at 11 PM last night.  Would like to see him afebrile for 24 hours before discharge.  Potential discharge tomorrow.  Patient currently is not medically stable to d/c.        Estimated body mass index is 28.37 kg/m as calculated from the following:   Height as of this encounter: $RemoveBeforeD'6\' 1"'[id number]$  (1.854 m).   Weight as of this encounter: 97.5 kg.      Nutritional status:               Consultants:   None  Procedures:    None  Antimicrobials:  Anti-infectives (From admission, onward)   Start     Dose/Rate Route Frequency Ordered Stop   03/02/20 1000  valACYclovir (VALTREX) tablet 500 mg        500 mg Oral 2 times daily 03/02/20 0818     03/02/20 0530  vancomycin (VANCOREADY) IVPB 750 mg/150 mL        750 mg 150 mL/hr over 60 Minutes Intravenous Every 12 hours 03/01/20 1519     03/02/20 0500  ceFEPIme (MAXIPIME) 2 g in sodium chloride 0.9 % 100 mL IVPB        2 g 200 mL/hr over 30 Minutes Intravenous Every 12 hours 03/01/20 1519     03/01/20 1445  vancomycin (VANCOREADY) IVPB 2000 mg/400 mL        2,000 mg 200 mL/hr over 120 Minutes Intravenous  Once 03/01/20 1439 03/01/20 1803   03/01/20 1445  ceFEPIme (MAXIPIME) 2 g in sodium chloride 0.9 % 100 mL IVPB        2 g 200 mL/hr over 30 Minutes Intravenous  Once 03/01/20 1439 03/01/20 1541         Subjective: Seen and examined.  Feels much better.  Overall feels just generalized weak.  No new complaint.  Wants to go home but okay with staying for now.  Objective: Vitals:   03/01/20 1806 03/01/20 1847 03/01/20 2154 03/02/20 0507  BP:  124/78 109/68 116/78  Pulse:  76 79 77  Resp:  $Remo'18 18 18  'cPeco$ Temp: 98.4 F (36.9 C) 98.2 F (36.8 C) (!) 101 F (38.3 C) 100 F (37.8 C)  TempSrc: Oral  Oral Oral  SpO2:  100% 95% 95%  Weight:      Height:        Intake/Output Summary (Last 24 hours) at 03/02/2020 1126 Last data filed at 03/02/2020 0300 Gross per 24 hour  Intake 237 ml  Output --  Net 237 ml   Filed Weights   03/01/20 1336  Weight: 97.5 kg    Examination:  General exam: Appears calm and comfortable  Respiratory system: Clear to auscultation. Respiratory effort normal. Cardiovascular system: S1 & S2 heard, RRR. No JVD, murmurs, rubs, gallops or clicks. No pedal edema. Gastrointestinal system: Abdomen is nondistended, soft and nontender. No organomegaly or masses felt. Normal bowel sounds heard. Central nervous system: Alert and  oriented. No focal neurological deficits. Extremities: Symmetric 5 x 5 power. Skin: No rashes, lesions or ulcers Psychiatry: Judgement and insight appear normal. Mood & affect appropriate.    Data Reviewed: I have personally reviewed following labs and imaging studies  CBC: Recent Labs  Lab 03/01/20 1407 03/02/20 0055  WBC 2.5* 1.8*  NEUTROABS 1.7  --   HGB 13.4 12.3*  HCT 38.0* 34.9*  MCV 103.3* 103.3*  PLT 111* 086*   Basic Metabolic Panel: Recent Labs  Lab 03/01/20 1407 03/01/20 1905 03/02/20 0055  NA 130* 133*  --   K 4.0  --   --   CL 101  --   --   CO2  19*  --   --   GLUCOSE 118*  --   --   BUN 19  --   --   CREATININE 2.08*  --   --   CALCIUM 8.2*  --   --   MG  --   --  2.2  PHOS  --   --  2.3*   GFR: Estimated Creatinine Clearance: 49.3 mL/min (A) (by C-G formula based on SCr of 2.08 mg/dL (H)). Liver Function Tests: Recent Labs  Lab 03/01/20 1407  AST 45*  ALT 101*  ALKPHOS 73  BILITOT 1.0  PROT 7.6  ALBUMIN 3.6   No results for input(s): LIPASE, AMYLASE in the last 168 hours. No results for input(s): AMMONIA in the last 168 hours. Coagulation Profile: Recent Labs  Lab 03/01/20 1440 03/02/20 0055  INR 1.1 1.1   Cardiac Enzymes: No results for input(s): CKTOTAL, CKMB, CKMBINDEX, TROPONINI in the last 168 hours. BNP (last 3 results) No results for input(s): PROBNP in the last 8760 hours. HbA1C: No results for input(s): HGBA1C in the last 72 hours. CBG: No results for input(s): GLUCAP in the last 168 hours. Lipid Profile: No results for input(s): CHOL, HDL, LDLCALC, TRIG, CHOLHDL, LDLDIRECT in the last 72 hours. Thyroid Function Tests: No results for input(s): TSH, T4TOTAL, FREET4, T3FREE, THYROIDAB in the last 72 hours. Anemia Panel: Recent Labs    03/02/20 0055 03/02/20 0059  VITAMINB12 123*  --   FOLATE  --  26.0   Sepsis Labs: Recent Labs  Lab 03/01/20 1407 03/01/20 1440  LATICACIDVEN 0.9 0.7    Recent Results (from the  past 240 hour(s))  Respiratory Panel by RT PCR (Flu A&B, Covid) - Nasopharyngeal Swab     Status: None   Collection Time: 03/01/20  2:23 PM   Specimen: Nasopharyngeal Swab  Result Value Ref Range Status   SARS Coronavirus 2 by RT PCR NEGATIVE NEGATIVE Final    Comment: (NOTE) SARS-CoV-2 target nucleic acids are NOT DETECTED.  The SARS-CoV-2 RNA is generally detectable in upper respiratoy specimens during the acute phase of infection. The lowest concentration of SARS-CoV-2 viral copies this assay can detect is 131 copies/mL. A negative result does not preclude SARS-Cov-2 infection and should not be used as the sole basis for treatment or other patient management decisions. A negative result may occur with  improper specimen collection/handling, submission of specimen other than nasopharyngeal swab, presence of viral mutation(s) within the areas targeted by this assay, and inadequate number of viral copies (<131 copies/mL). A negative result must be combined with clinical observations, patient history, and epidemiological information. The expected result is Negative.  Fact Sheet for Patients:  Tyler Phillips  Fact Sheet for Healthcare Providers:  GravelBags.it  This test is no t yet approved or cleared by the [place] FDA and  has been authorized for detection and/or diagnosis of SARS-CoV-2 by FDA under an Emergency Use Authorization (EUA). This EUA will remain  in effect (meaning this test can be used) for the duration of the COVID-19 declaration under Section 564(b)(1) of the Act, 21 U.S.C. section 360bbb-3(b)(1), unless the authorization is terminated or revoked sooner.     Influenza A by PCR NEGATIVE NEGATIVE Final   Influenza B by PCR NEGATIVE NEGATIVE Final    Comment: (NOTE) The Xpert Xpress SARS-CoV-2/FLU/RSV assay is intended as an aid in  the diagnosis of influenza from Nasopharyngeal swab specimens and   should not be used as a sole basis for treatment. Nasal washings and  aspirates are unacceptable for Xpert Xpress SARS-CoV-2/FLU/RSV  testing.  Fact Sheet for Patients: Tyler Phillips  Fact Sheet for Healthcare Providers: GravelBags.it  This test is not yet approved or cleared by the [place] FDA and  has been authorized for detection and/or diagnosis of SARS-CoV-2 by  FDA under an Emergency Use Authorization (EUA). This EUA will remain  in effect (meaning this test can be used) for the duration of the  Covid-19 declaration under Section 564(b)(1) of the Act, 21  U.S.C. section 360bbb-3(b)(1), unless the authorization is  terminated or revoked. Performed at [facility], [place] [address], [place], [place] [address]   Blood culture (routine x 2)     Status: None (Preliminary result)   Collection Time: 03/01/20  2:27 PM   Specimen: BLOOD  Result Value Ref Range Status   Specimen Description BLOOD RIGHT ANTECUBITAL  Final   Special Requests   Final    BOTTLES DRAWN AEROBIC AND ANAEROBIC Blood Culture adequate volume   Culture   Final    NO GROWTH < 24 HOURS Performed at [facility], [place] [address], [place], [place] [address]    Report Status PENDING  Incomplete  Blood culture (routine x 2)     Status: None (Preliminary result)   Collection Time: 03/01/20  2:52 PM   Specimen: BLOOD RIGHT WRIST  Result Value Ref Range Status   Specimen Description BLOOD RIGHT WRIST  Final   Special Requests   Final    BOTTLES DRAWN AEROBIC AND ANAEROBIC Blood Culture adequate volume   Culture   Final    NO GROWTH < 24 HOURS Performed at [facility], [place] [address], [place], [place] [address]    Report Status PENDING  Incomplete  Blood culture (routine single)     Status: None (Preliminary result)   Collection Time: 03/01/20  7:05 PM   Specimen: BLOOD LEFT ARM  Result Value Ref Range Status   Specimen Description  BLOOD LEFT ARM  Final   Special Requests   Final    BOTTLES DRAWN AEROBIC ONLY Blood Culture results may not be optimal due to an inadequate volume of blood received in culture bottles   Culture   Final    NO GROWTH < 24 HOURS Performed at [facility], [place] [address], [place], [place] [address]    Report Status PENDING  Incomplete      Radiology Studies: DG Chest 2 View  Result Date: 03/01/2020 CLINICAL DATA:  Fever EXAM: CHEST - 2 VIEW COMPARISON:  August 08, 2014 FINDINGS: The cardiomediastinal silhouette is unchanged in contour. No pleural effusion. No pneumothorax. There is a RIGHT middle lobe consolidative opacity. Visualized abdomen is unremarkable. Mild degenerative changes of the thoracic spine. IMPRESSION: RIGHT middle lobe pneumonia. Followup PA and lateral chest X-ray is recommended in 3-4 weeks following trial of antibiotic therapy to ensure resolution and exclude underlying malignancy. Electronically Signed   By: Valentino Saxon MD   On: 03/01/2020 14:12    Scheduled Meds: . aspirin  325 mg Oral Daily  . calcium carbonate  1,000 mg of elemental calcium Oral Q breakfast  . heparin  5,000 Units Subcutaneous Q8H  . valACYclovir  500 mg Oral BID   Continuous Infusions: . ceFEPime (MAXIPIME) IV 2 g (03/02/20 0702)  . vancomycin 750 mg (03/02/20 0534)     LOS: 1 day   Time spent: 34 minutes   Darliss Cheney, MD [facility]  03/02/2020, 11:26 AM  To contact the attending provider between 7A-7P or the covering provider during after hours 7P-7A, please log into the web site [email or web address].

## 2020-03-03 LAB — CBC WITH DIFFERENTIAL/PLATELET
Abs Immature Granulocytes: 0 10*3/uL (ref 0.00–0.07)
Basophils Absolute: 0 10*3/uL (ref 0.0–0.1)
Basophils Relative: 1 %
Eosinophils Absolute: 0 10*3/uL (ref 0.0–0.5)
Eosinophils Relative: 2 %
HCT: 31.3 % — ABNORMAL LOW (ref 39.0–52.0)
Hemoglobin: 11.3 g/dL — ABNORMAL LOW (ref 13.0–17.0)
Immature Granulocytes: 0 %
Lymphocytes Relative: 34 %
Lymphs Abs: 0.5 10*3/uL — ABNORMAL LOW (ref 0.7–4.0)
MCH: 36.6 pg — ABNORMAL HIGH (ref 26.0–34.0)
MCHC: 36.1 g/dL — ABNORMAL HIGH (ref 30.0–36.0)
MCV: 101.3 fL — ABNORMAL HIGH (ref 80.0–100.0)
Monocytes Absolute: 0.2 10*3/uL (ref 0.1–1.0)
Monocytes Relative: 14 %
Neutro Abs: 0.7 10*3/uL — ABNORMAL LOW (ref 1.7–7.7)
Neutrophils Relative %: 49 %
Platelets: 96 10*3/uL — ABNORMAL LOW (ref 150–400)
RBC: 3.09 MIL/uL — ABNORMAL LOW (ref 4.22–5.81)
RDW: 12.3 % (ref 11.5–15.5)
WBC: 1.5 10*3/uL — ABNORMAL LOW (ref 4.0–10.5)
nRBC: 0 % (ref 0.0–0.2)

## 2020-03-03 LAB — COMPREHENSIVE METABOLIC PANEL
ALT: 81 U/L — ABNORMAL HIGH (ref 0–44)
AST: 50 U/L — ABNORMAL HIGH (ref 15–41)
Albumin: 2.9 g/dL — ABNORMAL LOW (ref 3.5–5.0)
Alkaline Phosphatase: 54 U/L (ref 38–126)
Anion gap: 6 (ref 5–15)
BUN: 20 mg/dL (ref 6–20)
CO2: 22 mmol/L (ref 22–32)
Calcium: 7.9 mg/dL — ABNORMAL LOW (ref 8.9–10.3)
Chloride: 103 mmol/L (ref 98–111)
Creatinine, Ser: 1.9 mg/dL — ABNORMAL HIGH (ref 0.61–1.24)
GFR, Estimated: 41 mL/min — ABNORMAL LOW (ref >60)
Glucose, Bld: 98 mg/dL (ref 70–99)
Potassium: 3.8 mmol/L (ref 3.5–5.1)
Sodium: 131 mmol/L — ABNORMAL LOW (ref 135–145)
Total Bilirubin: 0.3 mg/dL (ref 0.3–1.2)
Total Protein: 6.5 g/dL (ref 6.5–8.1)

## 2020-03-03 LAB — PATHOLOGIST SMEAR REVIEW

## 2020-03-03 MED ORDER — SODIUM CHLORIDE 0.9 % IV SOLN: INTRAVENOUS | Status: AC

## 2020-03-03 MED ORDER — CEFDINIR 300 MG PO CAPS
300.0000 mg | ORAL_CAPSULE | Freq: Two times a day (BID) | ORAL | 0 refills | Status: AC
Start: 2020-03-03 — End: 2020-03-08

## 2020-03-03 MED ORDER — DOXYCYCLINE MONOHYDRATE 100 MG PO TABS: 100.0000 mg | ORAL_TABLET | Freq: Two times a day (BID) | ORAL | 0 refills | Status: AC

## 2020-03-03 NOTE — Plan of Care (Signed)
Patient A/Ox4, LCTA, HRR. Independent in room. X1 NS IV given prior to d/c per MD order. No c/o. Patient to be d/c to home and will f/u with provider. All belongings in possession and all questions addressed. AVS reviewed. IV removed and intact. Left via wheelchair with wife.  Problem: Education: Goal: Knowledge of General Education information will improve Description: Including pain rating scale, medication(s)/side effects and non-pharmacologic comfort measures Outcome: Adequate for Discharge   Problem: Health Behavior/Discharge Planning: Goal: Ability to manage health-related needs will improve Outcome: Adequate for Discharge   Problem: Clinical Measurements: Goal: Ability to maintain clinical measurements within normal limits will improve Outcome: Adequate for Discharge Goal: Will remain free from infection Outcome: Adequate for Discharge Goal: Diagnostic test results will improve Outcome: Adequate for Discharge Goal: Respiratory complications will improve Outcome: Adequate for Discharge Goal: Cardiovascular complication will be avoided Outcome: Adequate for Discharge   Problem: Activity: Goal: Risk for activity intolerance will decrease Outcome: Adequate for Discharge   Problem: Nutrition: Goal: Adequate nutrition will be maintained Outcome: Adequate for Discharge   Problem: Coping: Goal: Level of anxiety will decrease Outcome: Adequate for Discharge   Problem: Elimination: Goal: Will not experience complications related to bowel motility Outcome: Adequate for Discharge Goal: Will not experience complications related to urinary retention Outcome: Adequate for Discharge   Problem: Pain Managment: Goal: General experience of comfort will improve Outcome: Adequate for Discharge   Problem: Safety: Goal: Ability to remain free from injury will improve Outcome: Adequate for Discharge   Problem: Skin Integrity: Goal: Risk for impaired skin integrity will  decrease Outcome: Adequate for Discharge

## 2020-03-03 NOTE — Discharge Instructions (Signed)
Healthcare-Associated Pneumonia  Healthcare-associated pneumonia is a lung infection that a person can get when in a health care setting or during certain procedures. The infection causes air sacs inside the lungs to fill with pus or fluid. Healthcare-associated pneumonia is usually caused by bacteria that are common in health care settings. These bacteria may be resistant to some antibiotic medicines. What are the causes? This condition is caused by bacteria that get into your lungs. You can get this condition if you:  Breathe in droplets from an infected person's cough or sneeze.  Touch something that an infected person coughed or sneezed on and then touch your mouth, nose, or eyes.  Have a bacterial infection somewhere else in your body, if the bacteria spread to your lungs through your blood. What increases the risk? This condition is more likely to develop in people who:  Have a disease that weakens their body's defense system (immune system) or their ability to cough out germs.  Are older than age 55.  Having trouble swallowing.  Use a feeding or breathing tube.  Have a cold or the flu.  Have an IV tube inserted in a vein.  Have surgery.  Have a bed sore.  Live in a long-term care facility, such as a nursing home.  Were in the hospital for two or more days in the past 3 months.  Received hemodialysis in the past 30 days. What are the signs or symptoms? Symptoms of this condition include:  Fever.  Chills.  Cough.  Shortness of breath.  Wheezing or crackling sounds when breathing. How is this diagnosed? This condition may be diagnosed based on:  Your symptoms.  A chest X-ray.  A measurement of the amount of oxygen in your blood. How is this treated? This condition is treated with antibiotics. Your health care provider may take a sample of cells (culture) from your throat to determine what type of bacteria is in your lungs and change your antibiotic based  on the results. If you have bacteria in your blood, trouble breathing, or a low oxygen level, you may need to be treated at the hospital. At the hospital, you will be given antibiotics through an IV tube. You may also be given oxygen or breathing treatments. Follow these instructions at home: Medicine  Take your antibiotic medicine as told by your health care provider. Do not stop taking the antibiotic even if you start to feel better.  Take over-the-counter and other prescription medicines only as told by your health care provider. Activity  Rest at home until you feel better.  Return to your normal activities as told by your health care provider. Ask your health care provider what activities are safe for you. General instructions   Drink enough fluid to keep your urine clear or pale yellow.  Do not use any products that contain nicotine or tobacco, such as cigarettes and e-cigarettes. If you need help quitting, ask your health care provider.  Limit alcohol intake to no more than 1 drink per day for nonpregnant women and 2 drinks per day for men. One drink equals 12 oz of beer, 5 oz of wine, or 1 oz of hard liquor.  Keep all follow-up visits as told by your health care provider. This is important. How is this prevented? Actions that I can take To lower your risk of getting this condition again:  Do not smoke. This includes e-cigarettes.  Do not drink too much alcohol.  Keep your immune system healthy by eating  well and getting enough sleep.  Get a flu shot every year (annually).  Get a pneumonia vaccination if: ? You are older than age 95. ? You smoke. ? You have a long-lasting condition like lung disease.  Exercise your lungs by taking deep breaths, walking, and using an incentive spirometer as directed.  Wash your hands often with soap and water. If you cannot get to a sink to wash your hands, use an alcohol-based hand cleaner.  Make sure your health care providers are  washing their hands. If you do not see them wash their hands, ask them to do so.  When you are in a health care facility, avoid touching your eyes, nose, and mouth.  Avoid touching any surface near where people have coughed or sneezed.  Stand away from sick people when they are coughing or sneezing.  Wear a mask if you cannot avoid exposure to people who are sick.  Clean all surfaces often with a disinfectant cleaner, especially if someone is sick at home or work.  Precautions of my health care team Hospitals, nursing homes, and other health care facilities take special care to try to prevent healthcare-associated pneumonia. To do this, your health care team may:  Clean their hands with soap and water or with alcohol-based hand sanitizer before and after seeing patients.  Wear gloves or masks during treatment.  Sanitize medical instruments, tubes, other equipment, and surfaces in patient rooms.  Raise (elevate) the head of your hospital bed so you are not lying flat. The head of the bed may be elevated 30 degrees or more.  Have you sit up and move around as soon as possible after surgery.  Only insert a breathing tube if needed.  Do these things for you if you have a breathing tube: ? Clean the inside of your mouth regularly. ? Remove the breathing tube as soon as it is no longer needed. Contact a health care provider if:  Your symptoms do not get better or they get worse.  Your symptoms come back after you have finished taking your antibiotics. Get help right away if:  You have trouble breathing.  You have confusion or difficulty thinking. This information is not intended to replace advice given to you by your health care provider. Make sure you discuss any questions you have with your health care provider. Document Revised: 03/18/2017 Document Reviewed: 01/02/2016 Elsevier Patient Education  2020 [employer].

## 2020-03-03 NOTE — Progress Notes (Addendum)
Patient's IV leaking - restarted 22g in left AC. Shortly after approx 1130 patient stopped responding and had fixed gaze. Unable to initiate response. Lasted approx 15 seconds. Patient asked if he had a hx of seizures and was informed of what had just happened and stated "no". As this nurse was about to leave to page MD patient experienced another episode in which his gaze was fixed and began grunting. No response from patient when attempted to get response from him. This lasted approx 20-25 seconds. Charge notified and MD notified via secure chat in epic. While waiting for MD to arrive patient had x1 more episode which was shorter and probably only 5-10 seconds long. Suction was set up in room. Telemetry strip checked and had periods of extreme brady - 31bpm @ one point during episode. MD up to floor and assessed. Will monitor while IVF infuse and notify how patient is doing following. Patient @ baseline following these episodes and told not to get up without assistance since this happened to prevent falls.

## 2020-03-03 NOTE — Discharge Summary (Signed)
Physician Discharge Summary  [name] PPI:[id number] DOB: Apr 22, 1965 DOA: 03/01/2020  PCP: [name], MD  Admit date: 03/01/2020 Discharge date: 03/03/2020  Admitted From: Home Disposition: Home  Recommendations for Outpatient Follow-up:  1. Follow up with PCP in 1-2 weeks 2. Please obtain BMP/CBC in one week 3. Please follow up with your PCP on the following pending results: Unresulted Labs (From admission, onward)          Start     Ordered   03/03/20 0324  Pathologist smear review  Once,   R        03/03/20 [place]: None Equipment/Devices: None  Discharge Condition: Stable CODE STATUS: Full code Diet recommendation: Cardiac  Subjective: Seen and examined.  No complaints.  Very adamant on going home.  Brief/Interim Summary: Jarrid Lienhard Jr.is a 54 y.o.malewith medical history significant formultiple myeloma who presented to the emergency department due to 4-5 days of recurrent fever with maximum of 102Fas well as generalized weakness, chest congestion and mild chest pain. Patient was concerned that the symptoms may be related to COVID-19 virus, so hehadCOVID-19 virus test done on Thursday (11/11) that was negative, since the generalized weakness and the fever continued, he decided to go to the ED for further evaluation and management.   In the emergency department, patient was febrile with a temperature of 103F, but other vital signs were within normal range. Work-up in the ED showed WBC was 2.5, platelets 111, H/H 13.4/38 with MCV of 103.3, hyponatremia, BUN/creatinine 19/2.08 (baseline creatinine at 1.3-1.4). Urinalysis was unimpressive for UTI. Respiratory panel for influenza A, B and SARS coronavirus 2 was negative. Chest x-ray showed right middle lobe pneumonia. Patient was started on IV cefepime and IV vancomycin. Patient was admitted under hospital service.  Both antibiotics were continued.  Patient improved significantly  over the last 2 days.  His last fever was early this morning at 5 AM and it was 100.6.  Patient's platelets remained stable but his white cells dropped to 1.5.  Blood culture and urine culture remained negative.  Patient also came in with AKI on CKD stage IIIa.  His baseline creatinine seems to be around 1.4.  He presented with 2.08 which improved to 1.9.  Due to him having fever this morning and creatinine is still not back to baseline, I advised the patient to stay in the hospital so we can hydrate him more and monitor his temperature for next 24 hours and ideally would prefer for him to be fever free for at least 24 hours before we discharged however patient was very adamant on going home and strongly requested to discharge him home.  I did explain to him the potential risks of leaving home while he is still having fever and his white cells are dropping.  He verbalized understanding.  He reassured me that he will continue to hydrate himself at home for next 2 days and see his PCP early next week and call his PCP if he continues to have fever at home.  He is very reliable patient so we are discharging him at his strong request.  However before discharge, we have given him 1 L of IV fluid today.  I am discharging him on cefdinir in combination with azithromycin to cover atypicals as well due to his immunocompromised status.  Of note, patient's IV had infiltrated this morning.  Pain he was getting new IV line placed in, patient had episodes  where he was staring in the sky and was blank.  I saw patient again.  He told me that he has history of needle phobia and believes that the episode was due to needle phobia.  He was monitored for 4 hours after that and did not have any recurrence of symptoms.  He still wanted to go home.  Discharge Diagnoses:  Principal Problem:   Pneumonia Active Problems:   Multiple myeloma in remission (HCC)   Drug-induced neutropenia (HCC)   Chemotherapy-induced thrombocytopenia    History of herpes zoster   Macrocytic anemia   Hypocalcemia   Hyponatremia   AKI (acute kidney injury) ([place])   Neutropenia with fever ([place])   Dehydration    Discharge Instructions   Allergies as of 03/03/2020   No Known Allergies     Medication List    TAKE these medications   aspirin 325 MG tablet Take 325 mg by mouth daily.   bortezomib IV 3.5 MG injection Commonly known as: VELCADE Inject into the vein every 14 (fourteen) days.   cefdinir 300 MG capsule Commonly known as: OMNICEF Take 1 capsule (300 mg total) by mouth 2 (two) times daily for 5 days.   cetirizine 10 MG tablet Commonly known as: ZYRTEC Take 10 mg by mouth daily.   clindamycin 300 MG capsule Commonly known as: CLEOCIN Take 300 mg by mouth 4 (four) times daily. Prior to dental procedures.   lenalidomide 10 MG capsule Commonly known as: Revlimid TAKE 1 CAPSULE BY MOUTH  DAILY FOR 21 DAYS ON, THEN  7 DAYS OFF Josem Kaufmann #[id number] What changed:   how much to take  how to take this  when to take this  additional instructions   LORazepam 1 MG tablet Commonly known as: ATIVAN Take 1 tablet (1 mg total) by mouth every 4 (four) hours as needed.   valACYclovir 500 MG tablet Commonly known as: VALTREX Take 500 mg by mouth 2 (two) times daily.   zolpidem 10 MG tablet Commonly known as: AMBIEN Take 1 tablet (10 mg total) by mouth at bedtime as needed for sleep.       Follow-up Information    [name], MD Follow up in 1 week(s).   Specialty: Family Medicine Contact information: [place] [address] [phone]              No Known Allergies  Consultations: None   Procedures/Studies: DG Chest 2 View  Result Date: 03/01/2020 CLINICAL DATA:  Fever EXAM: CHEST - 2 VIEW COMPARISON:  August 08, 2014 FINDINGS: The cardiomediastinal silhouette is unchanged in contour. No pleural effusion. No pneumothorax. There is a RIGHT middle lobe consolidative opacity.  Visualized abdomen is unremarkable. Mild degenerative changes of the thoracic spine. IMPRESSION: RIGHT middle lobe pneumonia. Followup PA and lateral chest X-ray is recommended in 3-4 weeks following trial of antibiotic therapy to ensure resolution and exclude underlying malignancy. Electronically Signed   By: Valentino Saxon MD   On: 03/01/2020 14:12      Discharge Exam: Vitals:   03/03/20 0502 03/03/20 0600  BP: 101/70   Pulse: 62   Resp: 20   Temp: (!) 100.6 F (38.1 C) 98.8 F (37.1 C)  SpO2: 99%    Vitals:   03/02/20 1350 03/02/20 2139 03/03/20 0502 03/03/20 0600  BP: 118/78 126/79 101/70   Pulse: 64 79 62   Resp: '19 20 20   ' Temp: 97.8 F (36.6 C) 99 F (37.2 C) (!) 100.6 F (38.1  C) 98.8 F (37.1 C)  TempSrc:  Rectal Oral Oral  SpO2: 97% 97% 99%   Weight:      Height:        General: Pt is alert, awake, not in acute distress Cardiovascular: RRR, S1/S2 +, no rubs, no gallops Respiratory: CTA bilaterally, no wheezing, no rhonchi Abdominal: Soft, NT, ND, bowel sounds + Extremities: no edema, no cyanosis    The results of significant diagnostics from this hospitalization (including imaging, microbiology, ancillary and laboratory) are listed below for reference.     Microbiology: Recent Results (from the past 240 hour(s))  Respiratory Panel by RT PCR (Flu A&B, Covid) - Nasopharyngeal Swab     Status: None   Collection Time: 03/01/20  2:23 PM   Specimen: Nasopharyngeal Swab  Result Value Ref Range Status   SARS Coronavirus 2 by RT PCR NEGATIVE NEGATIVE Final    Comment: (NOTE) SARS-CoV-2 target nucleic acids are NOT DETECTED.  The SARS-CoV-2 RNA is generally detectable in upper respiratoy specimens during the acute phase of infection. The lowest concentration of SARS-CoV-2 viral copies this assay can detect is 131 copies/mL. A negative result does not preclude SARS-Cov-2 infection and should not be used as the sole basis for treatment or other patient  management decisions. A negative result may occur with  improper specimen collection/handling, submission of specimen other than nasopharyngeal swab, presence of viral mutation(s) within the areas targeted by this assay, and inadequate number of viral copies (<131 copies/mL). A negative result must be combined with clinical observations, patient history, and epidemiological information. The expected result is Negative.  Fact Sheet for Patients:  PinkCheek.be  Fact Sheet for Healthcare Providers:  GravelBags.it  This test is no t yet approved or cleared by the [place] FDA and  has been authorized for detection and/or diagnosis of SARS-CoV-2 by FDA under an Emergency Use Authorization (EUA). This EUA will remain  in effect (meaning this test can be used) for the duration of the COVID-19 declaration under Section 564(b)(1) of the Act, 21 U.S.C. section 360bbb-3(b)(1), unless the authorization is terminated or revoked sooner.     Influenza A by PCR NEGATIVE NEGATIVE Final   Influenza B by PCR NEGATIVE NEGATIVE Final    Comment: (NOTE) The Xpert Xpress SARS-CoV-2/FLU/RSV assay is intended as an aid in  the diagnosis of influenza from Nasopharyngeal swab specimens and  should not be used as a sole basis for treatment. Nasal washings and  aspirates are unacceptable for Xpert Xpress SARS-CoV-2/FLU/RSV  testing.  Fact Sheet for Patients: PinkCheek.be  Fact Sheet for Healthcare Providers: GravelBags.it  This test is not yet approved or cleared by the [place] FDA and  has been authorized for detection and/or diagnosis of SARS-CoV-2 by  FDA under an Emergency Use Authorization (EUA). This EUA will remain  in effect (meaning this test can be used) for the duration of the  Covid-19 declaration under Section 564(b)(1) of the Act, 21  U.S.C. section 360bbb-3(b)(1),  unless the authorization is  terminated or revoked. Performed at [facility], [place] [address] [place], [place] [address]   Blood culture (routine x 2)     Status: None (Preliminary result)   Collection Time: 03/01/20  2:27 PM   Specimen: BLOOD  Result Value Ref Range Status   Specimen Description BLOOD RIGHT ANTECUBITAL  Final   Special Requests   Final    BOTTLES DRAWN AEROBIC AND ANAEROBIC Blood Culture adequate volume   Culture   Final  NO GROWTH < 24 HOURS Performed at [facility] [address], [place], [place] [address]    Report Status PENDING  Incomplete  Urine culture     Status: None   Collection Time: 03/01/20  2:27 PM   Specimen: In/Out Cath Urine  Result Value Ref Range Status   Specimen Description IN/OUT CATH URINE  Final   Special Requests NONE  Final   Culture   Final    NO GROWTH Performed at [facility], [place] [address], [place], [place] [address]    Report Status 03/02/2020 FINAL  Final  Blood culture (routine x 2)     Status: None (Preliminary result)   Collection Time: 03/01/20  2:52 PM   Specimen: BLOOD RIGHT WRIST  Result Value Ref Range Status   Specimen Description BLOOD RIGHT WRIST  Final   Special Requests   Final    BOTTLES DRAWN AEROBIC AND ANAEROBIC Blood Culture adequate volume   Culture   Final    NO GROWTH < 24 HOURS Performed at [facility], [place] [address], [place], [place] [address]    Report Status PENDING  Incomplete  Blood culture (routine single)     Status: None (Preliminary result)   Collection Time: 03/01/20  7:05 PM   Specimen: BLOOD LEFT ARM  Result Value Ref Range Status   Specimen Description BLOOD LEFT ARM  Final   Special Requests   Final    BOTTLES DRAWN AEROBIC ONLY Blood Culture results may not be optimal due to an inadequate volume of blood received in culture bottles   Culture   Final    NO GROWTH < 24 HOURS Performed at [facility], [place] [address], [place], [place] [address]     Report Status PENDING  Incomplete     Labs: BNP (last 3 results) No results for input(s): BNP in the last 8760 hours. Basic Metabolic Panel: Recent Labs  Lab 03/01/20 1407 03/01/20 1905 03/02/20 0055 03/03/20 0324  NA 130* 133*  --  131*  K 4.0  --   --  3.8  CL 101  --   --  103  CO2 19*  --   --  22  GLUCOSE 118*  --   --  98  BUN 19  --   --  20  CREATININE 2.08*  --   --  1.90*  CALCIUM 8.2*  --   --  7.9*  MG  --   --  2.2  --   PHOS  --   --  2.3*  --    Liver Function Tests: Recent Labs  Lab 03/01/20 1407 03/03/20 0324  AST 45* 50*  ALT 101* 81*  ALKPHOS 73 54  BILITOT 1.0 0.3  PROT 7.6 6.5  ALBUMIN 3.6 2.9*   No results for input(s): LIPASE, AMYLASE in the last 168 hours. No results for input(s): AMMONIA in the last 168 hours. CBC: Recent Labs  Lab 03/01/20 1407 03/02/20 0055 03/03/20 0324  WBC 2.5* 1.8* 1.5*  NEUTROABS 1.7  --  0.7*  HGB 13.4 12.3* 11.3*  HCT 38.0* 34.9* 31.3*  MCV 103.3* 103.3* 101.3*  PLT 111* 101* 96*   Cardiac Enzymes: No results for input(s): CKTOTAL, CKMB, CKMBINDEX, TROPONINI in the last 168 hours. BNP: Invalid input(s): POCBNP CBG: No results for input(s): GLUCAP in the last 168 hours. D-Dimer No results for input(s): DDIMER in the last 72 hours. Hgb A1c No results for input(s): HGBA1C  in the last 72 hours. Lipid Profile No results for input(s): CHOL, HDL, LDLCALC, TRIG, CHOLHDL, LDLDIRECT in the last 72 hours. Thyroid function studies No results for input(s): TSH, T4TOTAL, T3FREE, THYROIDAB in the last 72 hours.  Invalid input(s): FREET3 Anemia work up Recent Labs    03/02/20 0055 03/02/20 0059  VITAMINB12 123*  --   FOLATE  --  26.0   Urinalysis    Component Value Date/Time   COLORURINE YELLOW 03/01/2020 1604   APPEARANCEUR HAZY (A) 03/01/2020 1604   LABSPEC 1.012 03/01/2020 1604   PHURINE 5.0 03/01/2020 1604   GLUCOSEU NEGATIVE 03/01/2020 1604   HGBUR NEGATIVE 03/01/2020 [place] 03/01/2020 [place] 03/01/2020 1604   PROTEINUR 30 (A) 03/01/2020 1604   UROBILINOGEN 0.2 07/20/2014 1743   NITRITE NEGATIVE 03/01/2020 1604   LEUKOCYTESUR NEGATIVE 03/01/2020 1604   Sepsis Labs Invalid input(s): PROCALCITONIN,  WBC,  LACTICIDVEN Microbiology Recent Results (from the past 240 hour(s))  Respiratory Panel by RT PCR (Flu A&B, Covid) - Nasopharyngeal Swab     Status: None   Collection Time: 03/01/20  2:23 PM   Specimen: Nasopharyngeal Swab  Result Value Ref Range Status   SARS Coronavirus 2 by RT PCR NEGATIVE NEGATIVE Final    Comment: (NOTE) SARS-CoV-2 target nucleic acids are NOT DETECTED.  The SARS-CoV-2 RNA is generally detectable in upper respiratoy specimens during the acute phase of infection. The lowest concentration of SARS-CoV-2 viral copies this assay can detect is 131 copies/mL. A negative result does not preclude SARS-Cov-2 infection and should not be used as the sole basis for treatment or other patient management decisions. A negative result may occur with  improper specimen collection/handling, submission of specimen other than nasopharyngeal swab, presence of viral mutation(s) within the areas targeted by this assay, and inadequate number of viral copies (<131 copies/mL). A negative result must be combined with clinical observations, patient history, and epidemiological information. The expected result is Negative.  Fact Sheet for Patients:  PinkCheek.be  Fact Sheet for Healthcare Providers:  GravelBags.it  This test is no t yet approved or cleared by the [place] FDA and  has been authorized for detection and/or diagnosis of SARS-CoV-2 by FDA under an Emergency Use Authorization (EUA). This EUA will remain  in effect (meaning this test can be used) for the duration of the COVID-19 declaration under Section 564(b)(1) of the Act, 21 U.S.C. section  360bbb-3(b)(1), unless the authorization is terminated or revoked sooner.     Influenza A by PCR NEGATIVE NEGATIVE Final   Influenza B by PCR NEGATIVE NEGATIVE Final    Comment: (NOTE) The Xpert Xpress SARS-CoV-2/FLU/RSV assay is intended as an aid in  the diagnosis of influenza from Nasopharyngeal swab specimens and  should not be used as a sole basis for treatment. Nasal washings and  aspirates are unacceptable for Xpert Xpress SARS-CoV-2/FLU/RSV  testing.  Fact Sheet for Patients: PinkCheek.be  Fact Sheet for Healthcare Providers: GravelBags.it  This test is not yet approved or cleared by the [place] FDA and  has been authorized for detection and/or diagnosis of SARS-CoV-2 by  FDA under an Emergency Use Authorization (EUA). This EUA will remain  in effect (meaning this test can be used) for the duration of the  Covid-19 declaration under Section 564(b)(1) of the Act, 21  U.S.C. section 360bbb-3(b)(1), unless the authorization is  terminated or revoked. Performed at [facility], [place] [address] [place], [place] [address]   Blood  culture (routine x 2)     Status: None (Preliminary result)   Collection Time: 03/01/20  2:27 PM   Specimen: BLOOD  Result Value Ref Range Status   Specimen Description BLOOD RIGHT ANTECUBITAL  Final   Special Requests   Final    BOTTLES DRAWN AEROBIC AND ANAEROBIC Blood Culture adequate volume   Culture   Final    NO GROWTH < 24 HOURS Performed at [facility], [address] [place], [place] [address]    Report Status PENDING  Incomplete  Urine culture     Status: None   Collection Time: 03/01/20  2:27 PM   Specimen: In/Out Cath Urine  Result Value Ref Range Status   Specimen Description IN/OUT CATH URINE  Final   Special Requests NONE  Final   Culture   Final    NO GROWTH Performed at [facility], [place] [address] [place], [place] [address]    Report  Status 03/02/2020 FINAL  Final  Blood culture (routine x 2)     Status: None (Preliminary result)   Collection Time: 03/01/20  2:52 PM   Specimen: BLOOD RIGHT WRIST  Result Value Ref Range Status   Specimen Description BLOOD RIGHT WRIST  Final   Special Requests   Final    BOTTLES DRAWN AEROBIC AND ANAEROBIC Blood Culture adequate volume   Culture   Final    NO GROWTH < 24 HOURS Performed at [facility], [place] [address], [place], [place] [address]    Report Status PENDING  Incomplete  Blood culture (routine single)     Status: None (Preliminary result)   Collection Time: 03/01/20  7:05 PM   Specimen: BLOOD LEFT ARM  Result Value Ref Range Status   Specimen Description BLOOD LEFT ARM  Final   Special Requests   Final    BOTTLES DRAWN AEROBIC ONLY Blood Culture results may not be optimal due to an inadequate volume of blood received in culture bottles   Culture   Final    NO GROWTH < 24 HOURS Performed at [facility], [place] [address], [place], [place] [address]    Report Status PENDING  Incomplete     Time coordinating discharge: Over 30 minutes  SIGNED:   Darliss Cheney, MD  [facility] 03/03/2020, 10:52 AM  If 7PM-7AM, please contact night-coverage [email or web address]

## 2020-03-05 ENCOUNTER — Telehealth: Payer: Self-pay

## 2020-03-05 ENCOUNTER — Telehealth: Payer: Self-pay | Admitting: *Deleted

## 2020-03-05 NOTE — Telephone Encounter (Signed)
Called pt per 03/05/20 inbasket message and added in a visit ... AOM

## 2020-03-05 NOTE — Telephone Encounter (Signed)
Call received from patient to inform Dr. [name] that he was admitted over the weekend with pneumonia and is currently taking Doxycycline and Omnicef.  Dr. [name] notified.  Patient notified per order of Dr. [name] to hold treatment of Velcade and Revlimid until 03/21/20.  Pt verbalizes an understanding to hold Revlimid and Velcade until 03/21/20.  Message sent to scheduling for pt to be added on to providers schedule for 03/21/20.

## 2020-03-06 LAB — CULTURE, BLOOD (ROUTINE X 2)
Culture: NO GROWTH
Culture: NO GROWTH
Special Requests: ADEQUATE
Special Requests: ADEQUATE

## 2020-03-06 LAB — CULTURE, BLOOD (SINGLE): Culture: NO GROWTH

## 2020-03-07 ENCOUNTER — Other Ambulatory Visit: Payer: Self-pay | Admitting: Pharmacist

## 2020-03-07 ENCOUNTER — Inpatient Hospital Stay: Payer: Medicare Other

## 2020-03-18 ENCOUNTER — Other Ambulatory Visit: Payer: Self-pay | Admitting: *Deleted

## 2020-03-18 DIAGNOSIS — C9002 Multiple myeloma in relapse: Secondary | ICD-10-CM

## 2020-03-18 MED ORDER — LENALIDOMIDE 10 MG PO CAPS: ORAL_CAPSULE | ORAL | 0 refills | Status: DC

## 2020-03-21 ENCOUNTER — Inpatient Hospital Stay: Payer: Medicare Other

## 2020-03-21 ENCOUNTER — Inpatient Hospital Stay: Payer: Medicare Other | Attending: Hematology & Oncology

## 2020-03-21 ENCOUNTER — Encounter: Payer: Self-pay | Admitting: Hematology & Oncology

## 2020-03-21 ENCOUNTER — Inpatient Hospital Stay (HOSPITAL_BASED_OUTPATIENT_CLINIC_OR_DEPARTMENT_OTHER): Payer: Medicare Other | Admitting: Hematology & Oncology

## 2020-03-21 ENCOUNTER — Other Ambulatory Visit: Payer: Self-pay

## 2020-03-21 VITALS — BP 123/84 | HR 72 | Temp 97.9°F | Resp 20 | Wt 209.8 lb

## 2020-03-21 DIAGNOSIS — Z5112 Encounter for antineoplastic immunotherapy: Secondary | ICD-10-CM | POA: Diagnosis present

## 2020-03-21 DIAGNOSIS — C9001 Multiple myeloma in remission: Secondary | ICD-10-CM

## 2020-03-21 DIAGNOSIS — C9000 Multiple myeloma not having achieved remission: Secondary | ICD-10-CM | POA: Diagnosis present

## 2020-03-21 LAB — CBC WITH DIFFERENTIAL (CANCER CENTER ONLY)
Abs Immature Granulocytes: 0 10*3/uL (ref 0.00–0.07)
Basophils Absolute: 0 10*3/uL (ref 0.0–0.1)
Basophils Relative: 0 %
Eosinophils Absolute: 0 10*3/uL (ref 0.0–0.5)
Eosinophils Relative: 1 %
HCT: 34.8 % — ABNORMAL LOW (ref 39.0–52.0)
Hemoglobin: 12.2 g/dL — ABNORMAL LOW (ref 13.0–17.0)
Immature Granulocytes: 0 %
Lymphocytes Relative: 40 %
Lymphs Abs: 1.1 10*3/uL (ref 0.7–4.0)
MCH: 35.9 pg — ABNORMAL HIGH (ref 26.0–34.0)
MCHC: 35.1 g/dL (ref 30.0–36.0)
MCV: 102.4 fL — ABNORMAL HIGH (ref 80.0–100.0)
Monocytes Absolute: 0.3 10*3/uL (ref 0.1–1.0)
Monocytes Relative: 10 %
Neutro Abs: 1.3 10*3/uL — ABNORMAL LOW (ref 1.7–7.7)
Neutrophils Relative %: 49 %
Platelet Count: 144 10*3/uL — ABNORMAL LOW (ref 150–400)
RBC: 3.4 MIL/uL — ABNORMAL LOW (ref 4.22–5.81)
RDW: 13.2 % (ref 11.5–15.5)
WBC Count: 2.7 10*3/uL — ABNORMAL LOW (ref 4.0–10.5)
nRBC: 0 % (ref 0.0–0.2)

## 2020-03-21 LAB — LACTATE DEHYDROGENASE: LDH: 150 U/L (ref 98–192)

## 2020-03-21 LAB — CMP (CANCER CENTER ONLY)
ALT: 48 U/L — ABNORMAL HIGH (ref 0–44)
AST: 28 U/L (ref 15–41)
Albumin: 4.2 g/dL (ref 3.5–5.0)
Alkaline Phosphatase: 57 U/L (ref 38–126)
Anion gap: 6 (ref 5–15)
BUN: 21 mg/dL — ABNORMAL HIGH (ref 6–20)
CO2: 23 mmol/L (ref 22–32)
Calcium: 9.3 mg/dL (ref 8.9–10.3)
Chloride: 107 mmol/L (ref 98–111)
Creatinine: 1.49 mg/dL — ABNORMAL HIGH (ref 0.61–1.24)
GFR, Estimated: 55 mL/min — ABNORMAL LOW (ref >60)
Glucose, Bld: 90 mg/dL (ref 70–99)
Potassium: 4.4 mmol/L (ref 3.5–5.1)
Sodium: 136 mmol/L (ref 135–145)
Total Bilirubin: 0.6 mg/dL (ref 0.3–1.2)
Total Protein: 7.1 g/dL (ref 6.5–8.1)

## 2020-03-21 MED ORDER — BORTEZOMIB CHEMO SQ INJECTION 3.5 MG (2.5MG/ML)
3.0000 mg | Freq: Once | INTRAMUSCULAR | Status: AC
  Administered 2020-03-21: 3 mg via SUBCUTANEOUS
  Filled 2020-03-21: qty 1.2

## 2020-03-21 NOTE — Progress Notes (Signed)
Reviewed pt labs with Dr. [name] and pt ok to treat with ANC 1.3

## 2020-03-21 NOTE — Patient Instructions (Signed)
[facility] Discharge Instructions for Patients Receiving Chemotherapy  Today you received the following chemotherapy agents Velcade To help prevent nausea and vomiting after your treatment, we encourage you to take your nausea medication as prescribed.   If you develop nausea and vomiting that is not controlled by your nausea medication, call the clinic.   BELOW ARE SYMPTOMS THAT SHOULD BE REPORTED IMMEDIATELY:  *FEVER GREATER THAN 100.5 F  *CHILLS WITH OR WITHOUT FEVER  NAUSEA AND VOMITING THAT IS NOT CONTROLLED WITH YOUR NAUSEA MEDICATION  *UNUSUAL SHORTNESS OF BREATH  *UNUSUAL BRUISING OR BLEEDING  TENDERNESS IN MOUTH AND THROAT WITH OR WITHOUT PRESENCE OF ULCERS  *URINARY PROBLEMS  *BOWEL PROBLEMS  UNUSUAL RASH Items with * indicate a potential emergency and should be followed up as soon as possible.  Feel free to call the clinic should you have any questions or concerns. The clinic phone number is [phone].  Please show the CHEMO ALERT CARD at check-in to the Emergency Department and triage nurse.   

## 2020-03-21 NOTE — Progress Notes (Signed)
Hematology and Oncology Follow Up Visit  Tyler Phillips [id number] 30-Sep-1965 54 y.o. 03/21/2020   Principle Diagnosis:  IgG Kappa myeloma - +4, +14, +17  Past Therapy: Status post autologous stem cell transplant on 05/22/2015 S/pcycle 6 of RVD Ninlaro 4 mg po q 2 week -- start on 01/02/2019 -- d/c on 02/10/2019  Current Therapy: Velcade q 2wk dosing/Revlimid 10mg  po q day (21/7)   Interim History:  [name] is here today for follow-up.  Surprisingly, he was in the hospital for a couple days.  He had a right middle lobe pneumonia.  This was about 3 weeks ago.  Thankfully, he looks quite good right now.  His IgG level has not been low which is encouraging.  He has had no problems with chest wall pain.  He has had no cough.  He has had no nausea or vomiting.  He is family to have a nice Thanksgiving.  He has been active.  He always does a lot of work outside.  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  Is going to try to go up around Christmas.  He has had no problems with rashes.  There has been no leg swelling.  He has had no headache.  We are watching his light chains.  In early November, his kappa light chain was 5.0mg /dL.  Prior to this, it was 4.7 mg/dL.  Currently, his performance status is ECOG 0.  Medications:  Allergies as of 03/21/2020   No Known Allergies     Medication List       Accurate as of March 21, 2020 11:56 AM. If you have any questions, ask your nurse or doctor.        aspirin 325 MG tablet Take 325 mg by mouth daily.   bortezomib IV 3.5 MG injection Commonly known as: VELCADE Inject into the vein every 14 (fourteen) days.   cetirizine 10 MG tablet Commonly known as: ZYRTEC Take 10 mg by mouth daily.   clindamycin 300 MG capsule Commonly known as: CLEOCIN Take 300 mg by mouth 4 (four) times daily. Prior to dental procedures.   lenalidomide 10 MG capsule Commonly known as: Revlimid TAKE 1 CAPSULE  BY MOUTH  DAILY FOR 21 DAYS ON, THEN  7 DAYS OFF Auth #[id number]   LORazepam 1 MG tablet Commonly known as: ATIVAN Take 1 tablet (1 mg total) by mouth every 4 (four) hours as needed.   valACYclovir 500 MG tablet Commonly known as: VALTREX Take 500 mg by mouth 2 (two) times daily.   zolpidem 10 MG tablet Commonly known as: AMBIEN Take 1 tablet (10 mg total) by mouth at bedtime as needed for sleep.       Allergies: No Known Allergies  Past Medical History, Surgical history, Social history, and Family History were reviewed and updated.  Review of Systems: Review of Systems  Constitutional: Negative.   HENT: Negative.   Eyes: Negative.   Respiratory: Negative.   Cardiovascular: Negative.   Gastrointestinal: Negative.   Genitourinary: Negative.   Musculoskeletal: Negative.   Skin: Negative.   Neurological: Negative.   Endo/Heme/Allergies: Negative.   Psychiatric/Behavioral: Negative.      Physical Exam:  weight is 209 lb 12.8 oz (95.2 kg). His oral temperature is 97.9 F (36.6 C). His blood pressure is 123/84 and his pulse is 72. His respiration is 20 and oxygen saturation is 100%.   Wt Readings from Last 3 Encounters:  03/21/20 209 lb  12.8 oz (95.2 kg)  03/01/20 215 lb (97.5 kg)  02/22/20 214 lb (97.1 kg)   Physical Exam Vitals reviewed.  HENT:     Head: Normocephalic and atraumatic.  Eyes:     Pupils: Pupils are equal, round, and reactive to light.  Cardiovascular:     Rate and Rhythm: Normal rate and regular rhythm.     Heart sounds: Normal heart sounds.  Pulmonary:     Effort: Pulmonary effort is normal.     Breath sounds: Normal breath sounds.  Abdominal:     General: Bowel sounds are normal.     Palpations: Abdomen is soft.  Musculoskeletal:        General: No tenderness or deformity. Normal range of motion.     Cervical back: Normal range of motion.  Lymphadenopathy:     Cervical: No cervical adenopathy.  Skin:    General: Skin is warm and dry.      Findings: No erythema or rash.  Neurological:     Mental Status: He is alert and oriented to person, place, and time.  Psychiatric:        Behavior: Behavior normal.        Thought Content: Thought content normal.        Judgment: Judgment normal.      Lab Results  Component Value Date   WBC 2.7 (L) 03/21/2020   HGB 12.2 (L) 03/21/2020   HCT 34.8 (L) 03/21/2020   MCV 102.4 (H) 03/21/2020   PLT 144 (L) 03/21/2020   Lab Results  Component Value Date   FERRITIN 1,263 (H) 07/20/2014   IRON 125 07/20/2014   TIBC 198 (L) 07/20/2014   UIBC 73 (L) 07/20/2014   IRONPCTSAT 63 (H) 07/20/2014   Lab Results  Component Value Date   RETICCTPCT 0.8 07/20/2014   RBC 3.40 (L) 03/21/2020   Lab Results  Component Value Date   KPAFRELGTCHN 50.4 (H) 02/22/2020   LAMBDASER 31.8 (H) 02/22/2020   KAPLAMBRATIO 1.58 02/22/2020   Lab Results  Component Value Date   IGGSERUM 1,342 02/22/2020   IGA 234 02/22/2020   IGMSERUM 21 02/22/2020   Lab Results  Component Value Date   TOTALPROTELP 7.1 02/22/2020   ALBUMINELP 4.0 02/22/2020   A1GS 0.2 02/22/2020   A2GS 0.6 02/22/2020   BETS 0.9 02/22/2020   BETA2SER 0.3 03/28/2015   GAMS 1.4 02/22/2020   MSPIKE Not Observed 02/22/2020   SPEI Comment 01/25/2020     Chemistry      Component Value Date/Time   NA 136 03/21/2020 1053   NA 140 04/22/2017 1140   NA 138 09/03/2015 1042   K 4.4 03/21/2020 1053   K 4.0 04/22/2017 1140   K 4.3 09/03/2015 1042   CL 107 03/21/2020 1053   CL 105 04/22/2017 1140   CO2 23 03/21/2020 1053   CO2 24 04/22/2017 1140   CO2 25 09/03/2015 1042   BUN 21 (H) 03/21/2020 1053   BUN 16 04/22/2017 1140   BUN 21.7 09/03/2015 1042   CREATININE 1.49 (H) 03/21/2020 1053   CREATININE 1.6 (H) 04/22/2017 1140   CREATININE 1.2 09/03/2015 1042      Component Value Date/Time   CALCIUM 9.3 03/21/2020 1053   CALCIUM 8.5 04/22/2017 1140   CALCIUM 9.1 09/03/2015 1042   ALKPHOS 57 03/21/2020 1053   ALKPHOS 59  04/22/2017 1140   ALKPHOS 42 09/03/2015 1042   AST 28 03/21/2020 1053   AST 27 09/03/2015 1042   ALT 48 (H) 03/21/2020  1053   ALT 45 04/22/2017 1140   ALT 41 09/03/2015 1042   BILITOT 0.6 03/21/2020 1053   BILITOT 0.54 09/03/2015 1042       Impression and Plan: [name] is a very pleasant 54 yo caucasian gentleman withIgG kappa myeloma. He underwent induction chemotherapy with RVD followed by an autologous stem cell transplant at [facility] on February 2017.  We are still watching his Kappa light chain closely.  Hopefully, there will still be stable so we do not have to worry about making a change in his protocol.   I just feel bad that he was in the hospital for this pneumonia.  This is really his first episode of having a problem.  As such, I do not think we have to think about IVIG.  I we will make an adjustment to his schedule with the holidays coming up.  I will plan to see him back in January.    [name], MD 12/3/202111:56 AM

## 2020-03-22 LAB — IGG, IGA, IGM
IgA: 233 mg/dL (ref 90–386)
IgG (Immunoglobin G), Serum: 1417 mg/dL (ref 603–1613)
IgM (Immunoglobulin M), Srm: 20 mg/dL (ref 20–172)

## 2020-03-24 ENCOUNTER — Telehealth: Payer: Self-pay

## 2020-03-24 LAB — KAPPA/LAMBDA LIGHT CHAINS
Kappa free light chain: 36.7 mg/L — ABNORMAL HIGH (ref 3.3–19.4)
Kappa, lambda light chain ratio: 1.64 (ref 0.26–1.65)
Lambda free light chains: 22.4 mg/L (ref 5.7–26.3)

## 2020-03-24 NOTE — Telephone Encounter (Signed)
Called and left a vm with f/u per 03/21/20 ,os    AOM

## 2020-03-25 ENCOUNTER — Telehealth: Payer: Self-pay | Admitting: *Deleted

## 2020-03-25 ENCOUNTER — Ambulatory Visit: Payer: Medicare Other | Admitting: Hematology & Oncology

## 2020-03-25 LAB — PROTEIN ELECTROPHORESIS, SERUM, WITH REFLEX
A/G Ratio: 1.3 (ref 0.7–1.7)
Albumin ELP: 3.9 g/dL (ref 2.9–4.4)
Alpha-1-Globulin: 0.2 g/dL (ref 0.0–0.4)
Alpha-2-Globulin: 0.6 g/dL (ref 0.4–1.0)
Beta Globulin: 0.9 g/dL (ref 0.7–1.3)
Gamma Globulin: 1.3 g/dL (ref 0.4–1.8)
Globulin, Total: 3 g/dL (ref 2.2–3.9)
Total Protein ELP: 6.9 g/dL (ref 6.0–8.5)

## 2020-03-25 NOTE — Telephone Encounter (Signed)
-----   Message from [name], MD sent at 03/24/2020  5:52 PM EST ----- Please call and tell him that the Kappa light chain is better.  Merry Christmas!!  Laurey Arrow

## 2020-04-04 ENCOUNTER — Inpatient Hospital Stay: Payer: Medicare Other

## 2020-04-04 ENCOUNTER — Other Ambulatory Visit: Payer: Self-pay

## 2020-04-04 ENCOUNTER — Inpatient Hospital Stay (HOSPITAL_BASED_OUTPATIENT_CLINIC_OR_DEPARTMENT_OTHER): Payer: Medicare Other | Admitting: Hematology & Oncology

## 2020-04-04 ENCOUNTER — Encounter: Payer: Self-pay | Admitting: Hematology & Oncology

## 2020-04-04 ENCOUNTER — Telehealth: Payer: Self-pay

## 2020-04-04 VITALS — BP 125/80 | HR 67 | Temp 98.2°F | Resp 17 | Wt 212.0 lb

## 2020-04-04 DIAGNOSIS — C9001 Multiple myeloma in remission: Secondary | ICD-10-CM | POA: Diagnosis not present

## 2020-04-04 DIAGNOSIS — Z5112 Encounter for antineoplastic immunotherapy: Secondary | ICD-10-CM | POA: Diagnosis not present

## 2020-04-04 LAB — CBC WITH DIFFERENTIAL (CANCER CENTER ONLY)
Abs Immature Granulocytes: 0.01 10*3/uL (ref 0.00–0.07)
Basophils Absolute: 0 10*3/uL (ref 0.0–0.1)
Basophils Relative: 0 %
Eosinophils Absolute: 0.1 10*3/uL (ref 0.0–0.5)
Eosinophils Relative: 1 %
HCT: 33.4 % — ABNORMAL LOW (ref 39.0–52.0)
Hemoglobin: 12 g/dL — ABNORMAL LOW (ref 13.0–17.0)
Immature Granulocytes: 0 %
Lymphocytes Relative: 19 %
Lymphs Abs: 0.7 10*3/uL (ref 0.7–4.0)
MCH: 36.5 pg — ABNORMAL HIGH (ref 26.0–34.0)
MCHC: 35.9 g/dL (ref 30.0–36.0)
MCV: 101.5 fL — ABNORMAL HIGH (ref 80.0–100.0)
Monocytes Absolute: 0.4 10*3/uL (ref 0.1–1.0)
Monocytes Relative: 11 %
Neutro Abs: 2.7 10*3/uL (ref 1.7–7.7)
Neutrophils Relative %: 69 %
Platelet Count: 154 10*3/uL (ref 150–400)
RBC: 3.29 MIL/uL — ABNORMAL LOW (ref 4.22–5.81)
RDW: 13.5 % (ref 11.5–15.5)
WBC Count: 3.9 10*3/uL — ABNORMAL LOW (ref 4.0–10.5)
nRBC: 0 % (ref 0.0–0.2)

## 2020-04-04 LAB — CMP (CANCER CENTER ONLY)
ALT: 43 U/L (ref 0–44)
AST: 24 U/L (ref 15–41)
Albumin: 4.2 g/dL (ref 3.5–5.0)
Alkaline Phosphatase: 52 U/L (ref 38–126)
Anion gap: 7 (ref 5–15)
BUN: 13 mg/dL (ref 6–20)
CO2: 26 mmol/L (ref 22–32)
Calcium: 9 mg/dL (ref 8.9–10.3)
Chloride: 105 mmol/L (ref 98–111)
Creatinine: 1.24 mg/dL (ref 0.61–1.24)
GFR, Estimated: >60 mL/min (ref >60)
Glucose, Bld: 106 mg/dL — ABNORMAL HIGH (ref 70–99)
Potassium: 3.8 mmol/L (ref 3.5–5.1)
Sodium: 138 mmol/L (ref 135–145)
Total Bilirubin: 0.5 mg/dL (ref 0.3–1.2)
Total Protein: 6.8 g/dL (ref 6.5–8.1)

## 2020-04-04 LAB — LACTATE DEHYDROGENASE: LDH: 145 U/L (ref 98–192)

## 2020-04-04 MED ORDER — BORTEZOMIB CHEMO SQ INJECTION 3.5 MG (2.5MG/ML)
3.0000 mg | Freq: Once | INTRAMUSCULAR | Status: AC
  Administered 2020-04-04: 3 mg via SUBCUTANEOUS
  Filled 2020-04-04: qty 1.2

## 2020-04-04 NOTE — Progress Notes (Signed)
Hematology and Oncology Follow Up Visit  Tyler Phillips [id number] 08-27-1965 54 y.o. 04/04/2020   Principle Diagnosis:  IgG Kappa myeloma - +4, +14, +17  Past Therapy: Status post autologous stem cell transplant on 05/22/2015 S/pcycle 6 of RVD Ninlaro 4 mg po q 2 week -- start on 01/02/2019 -- d/c on 02/10/2019  Current Therapy: Velcade q 2wk dosing/Revlimid 10mg  po q day (21/7)   Interim History:  [name] is here today for follow-up.  Overall, he is doing quite well.  We actually saw him 2 weeks ago.  Thankfully, his Kappa light chain is better.  He went from 5.1 mg/dL down to 3.8 mg/dL.  He is looking forward to a busy Christmas holiday with his family.  His children are coming in.  He has been exercising.  Has been doing a lot of work on the land.  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  He was quite busy up there.  He has had no problems with fever.  He has had no further problems with cough or shortness of breath.  There is been no issues with pneumonia.  He has had no change in bowel or bladder habits.  He has had no nausea or vomiting.  He has had no leg swelling.  He has had no rashes.  Overall, he really has had a good year.  His performance status is ECOG 0.    Medications:  Allergies as of 04/04/2020   No Known Allergies     Medication List       Accurate as of April 04, 2020  3:08 PM. If you have any questions, ask your nurse or doctor.        aspirin 325 MG tablet Take 325 mg by mouth daily.   bortezomib IV 3.5 MG injection Commonly known as: VELCADE Inject into the vein every 14 (fourteen) days.   cetirizine 10 MG tablet Commonly known as: ZYRTEC Take 10 mg by mouth daily.   clindamycin 300 MG capsule Commonly known as: CLEOCIN Take 300 mg by mouth 4 (four) times daily. Prior to dental procedures.   lenalidomide 10 MG capsule Commonly known as: Revlimid TAKE 1 CAPSULE BY MOUTH  DAILY FOR 21 DAYS ON,  THEN  7 DAYS OFF Auth #[id number]   LORazepam 1 MG tablet Commonly known as: ATIVAN Take 1 tablet (1 mg total) by mouth every 4 (four) hours as needed.   valACYclovir 500 MG tablet Commonly known as: VALTREX Take 500 mg by mouth 2 (two) times daily.   zolpidem 10 MG tablet Commonly known as: AMBIEN Take 1 tablet (10 mg total) by mouth at bedtime as needed for sleep.       Allergies: No Known Allergies  Past Medical History, Surgical history, Social history, and Family History were reviewed and updated.  Review of Systems: Review of Systems  Constitutional: Negative.   HENT: Negative.   Eyes: Negative.   Respiratory: Negative.   Cardiovascular: Negative.   Gastrointestinal: Negative.   Genitourinary: Negative.   Musculoskeletal: Negative.   Skin: Negative.   Neurological: Negative.   Endo/Heme/Allergies: Negative.   Psychiatric/Behavioral: Negative.      Physical Exam:  weight is 212 lb (96.2 kg). His oral temperature is 98.2 F (36.8 C). His blood pressure is 125/80 and his pulse is 67. His respiration is 17 and oxygen saturation is 100%.   Wt Readings from Last 3 Encounters:  04/04/20 212 lb (96.2 kg)  03/21/20  209 lb 12.8 oz (95.2 kg)  03/01/20 215 lb (97.5 kg)   Physical Exam Vitals reviewed.  HENT:     Head: Normocephalic and atraumatic.  Eyes:     Pupils: Pupils are equal, round, and reactive to light.  Cardiovascular:     Rate and Rhythm: Normal rate and regular rhythm.     Heart sounds: Normal heart sounds.  Pulmonary:     Effort: Pulmonary effort is normal.     Breath sounds: Normal breath sounds.  Abdominal:     General: Bowel sounds are normal.     Palpations: Abdomen is soft.  Musculoskeletal:        General: No tenderness or deformity. Normal range of motion.     Cervical back: Normal range of motion.  Lymphadenopathy:     Cervical: No cervical adenopathy.  Skin:    General: Skin is warm and dry.     Findings: No erythema or rash.   Neurological:     Mental Status: He is alert and oriented to person, place, and time.  Psychiatric:        Behavior: Behavior normal.        Thought Content: Thought content normal.        Judgment: Judgment normal.      Lab Results  Component Value Date   WBC 3.9 (L) 04/04/2020   HGB 12.0 (L) 04/04/2020   HCT 33.4 (L) 04/04/2020   MCV 101.5 (H) 04/04/2020   PLT 154 04/04/2020   Lab Results  Component Value Date   FERRITIN 1,263 (H) 07/20/2014   IRON 125 07/20/2014   TIBC 198 (L) 07/20/2014   UIBC 73 (L) 07/20/2014   IRONPCTSAT 63 (H) 07/20/2014   Lab Results  Component Value Date   RETICCTPCT 0.8 07/20/2014   RBC 3.29 (L) 04/04/2020   Lab Results  Component Value Date   KPAFRELGTCHN 36.7 (H) 03/21/2020   LAMBDASER 22.4 03/21/2020   KAPLAMBRATIO 1.64 03/21/2020   Lab Results  Component Value Date   IGGSERUM 1,417 03/21/2020   IGA 233 03/21/2020   IGMSERUM 20 03/21/2020   Lab Results  Component Value Date   TOTALPROTELP 6.9 03/21/2020   ALBUMINELP 3.9 03/21/2020   A1GS 0.2 03/21/2020   A2GS 0.6 03/21/2020   BETS 0.9 03/21/2020   BETA2SER 0.3 03/28/2015   GAMS 1.3 03/21/2020   MSPIKE Not Observed 03/21/2020   SPEI Comment 01/25/2020     Chemistry      Component Value Date/Time   NA 138 04/04/2020 1358   NA 140 04/22/2017 1140   NA 138 09/03/2015 1042   K 3.8 04/04/2020 1358   K 4.0 04/22/2017 1140   K 4.3 09/03/2015 1042   CL 105 04/04/2020 1358   CL 105 04/22/2017 1140   CO2 26 04/04/2020 1358   CO2 24 04/22/2017 1140   CO2 25 09/03/2015 1042   BUN 13 04/04/2020 1358   BUN 16 04/22/2017 1140   BUN 21.7 09/03/2015 1042   CREATININE 1.24 04/04/2020 1358   CREATININE 1.6 (H) 04/22/2017 1140   CREATININE 1.2 09/03/2015 1042      Component Value Date/Time   CALCIUM 9.0 04/04/2020 1358   CALCIUM 8.5 04/22/2017 1140   CALCIUM 9.1 09/03/2015 1042   ALKPHOS 52 04/04/2020 1358   ALKPHOS 59 04/22/2017 1140   ALKPHOS 42 09/03/2015 1042   AST 24  04/04/2020 1358   AST 27 09/03/2015 1042   ALT 43 04/04/2020 1358   ALT 45 04/22/2017 1140  ALT 41 09/03/2015 1042   BILITOT 0.5 04/04/2020 1358   BILITOT 0.54 09/03/2015 1042       Impression and Plan: [name] is a very pleasant 54 yo caucasian gentleman withIgG kappa myeloma. He underwent induction chemotherapy with RVD followed by an autologous stem cell transplant at [facility] on February 2017.  We are still have to watch  his Kappa light chain closely.  Hopefully, we will see stability or further decrease.  All of his lab work actually looks better.  Maybe, the fact that this pneumonia is resolved is making a difference.   I will plan to see him back in January.    [name], MD 12/17/20213:08 PM

## 2020-04-04 NOTE — Telephone Encounter (Signed)
appts made per 04/04/20 los, pt to receive appts in tx-avs... aom

## 2020-04-04 NOTE — Patient Instructions (Signed)
[facility] Discharge Instructions for Patients Receiving Chemotherapy  Today you received the following chemotherapy agents Velcade To help prevent nausea and vomiting after your treatment, we encourage you to take your nausea medication as prescribed.   If you develop nausea and vomiting that is not controlled by your nausea medication, call the clinic.   BELOW ARE SYMPTOMS THAT SHOULD BE REPORTED IMMEDIATELY:  *FEVER GREATER THAN 100.5 F  *CHILLS WITH OR WITHOUT FEVER  NAUSEA AND VOMITING THAT IS NOT CONTROLLED WITH YOUR NAUSEA MEDICATION  *UNUSUAL SHORTNESS OF BREATH  *UNUSUAL BRUISING OR BLEEDING  TENDERNESS IN MOUTH AND THROAT WITH OR WITHOUT PRESENCE OF ULCERS  *URINARY PROBLEMS  *BOWEL PROBLEMS  UNUSUAL RASH Items with * indicate a potential emergency and should be followed up as soon as possible.  Feel free to call the clinic should you have any questions or concerns. The clinic phone number is [phone].  Please show the CHEMO ALERT CARD at check-in to the Emergency Department and triage nurse.   

## 2020-04-05 LAB — IGG, IGA, IGM
IgA: 194 mg/dL (ref 90–386)
IgG (Immunoglobin G), Serum: 1202 mg/dL (ref 603–1613)
IgM (Immunoglobulin M), Srm: 18 mg/dL — ABNORMAL LOW (ref 20–172)

## 2020-04-07 LAB — PROTEIN ELECTROPHORESIS, SERUM, WITH REFLEX
A/G Ratio: 1.3 (ref 0.7–1.7)
Albumin ELP: 3.8 g/dL (ref 2.9–4.4)
Alpha-1-Globulin: 0.2 g/dL (ref 0.0–0.4)
Alpha-2-Globulin: 0.6 g/dL (ref 0.4–1.0)
Beta Globulin: 0.9 g/dL (ref 0.7–1.3)
Gamma Globulin: 1.2 g/dL (ref 0.4–1.8)
Globulin, Total: 2.9 g/dL (ref 2.2–3.9)
Total Protein ELP: 6.7 g/dL (ref 6.0–8.5)

## 2020-04-07 LAB — KAPPA/LAMBDA LIGHT CHAINS
Kappa free light chain: 40.6 mg/L — ABNORMAL HIGH (ref 3.3–19.4)
Kappa, lambda light chain ratio: 1.5 (ref 0.26–1.65)
Lambda free light chains: 27 mg/L — ABNORMAL HIGH (ref 5.7–26.3)

## 2020-04-22 ENCOUNTER — Telehealth: Payer: Self-pay

## 2020-04-22 NOTE — Telephone Encounter (Signed)
Pt had visit scheduled 04/25/20, Dr Loraine Maple now on call, pt req to skip visit and keep lab and inj only, ok per donnica, pt aware    aom

## 2020-04-25 ENCOUNTER — Other Ambulatory Visit: Payer: Self-pay

## 2020-04-25 ENCOUNTER — Inpatient Hospital Stay: Payer: Medicare Other

## 2020-04-25 ENCOUNTER — Inpatient Hospital Stay: Payer: Medicare Other | Attending: Hematology & Oncology

## 2020-04-25 ENCOUNTER — Inpatient Hospital Stay: Payer: Medicare Other | Admitting: Hematology & Oncology

## 2020-04-25 VITALS — BP 151/88 | HR 67 | Temp 98.0°F | Resp 18

## 2020-04-25 DIAGNOSIS — C9001 Multiple myeloma in remission: Secondary | ICD-10-CM | POA: Diagnosis not present

## 2020-04-25 DIAGNOSIS — Z5112 Encounter for antineoplastic immunotherapy: Secondary | ICD-10-CM | POA: Insufficient documentation

## 2020-04-25 DIAGNOSIS — C9000 Multiple myeloma not having achieved remission: Secondary | ICD-10-CM

## 2020-04-25 DIAGNOSIS — Z79899 Other long term (current) drug therapy: Secondary | ICD-10-CM | POA: Diagnosis not present

## 2020-04-25 LAB — CBC WITH DIFFERENTIAL (CANCER CENTER ONLY)
Abs Immature Granulocytes: 0.01 10*3/uL (ref 0.00–0.07)
Basophils Absolute: 0 10*3/uL (ref 0.0–0.1)
Basophils Relative: 1 %
Eosinophils Absolute: 0.1 10*3/uL (ref 0.0–0.5)
Eosinophils Relative: 3 %
HCT: 34.7 % — ABNORMAL LOW (ref 39.0–52.0)
Hemoglobin: 12.4 g/dL — ABNORMAL LOW (ref 13.0–17.0)
Immature Granulocytes: 0 %
Lymphocytes Relative: 29 %
Lymphs Abs: 0.8 10*3/uL (ref 0.7–4.0)
MCH: 36 pg — ABNORMAL HIGH (ref 26.0–34.0)
MCHC: 35.7 g/dL (ref 30.0–36.0)
MCV: 100.9 fL — ABNORMAL HIGH (ref 80.0–100.0)
Monocytes Absolute: 0.2 10*3/uL (ref 0.1–1.0)
Monocytes Relative: 5 %
Neutro Abs: 1.7 10*3/uL (ref 1.7–7.7)
Neutrophils Relative %: 62 %
Platelet Count: 144 10*3/uL — ABNORMAL LOW (ref 150–400)
RBC: 3.44 MIL/uL — ABNORMAL LOW (ref 4.22–5.81)
RDW: 13.3 % (ref 11.5–15.5)
WBC Count: 2.8 10*3/uL — ABNORMAL LOW (ref 4.0–10.5)
nRBC: 0 % (ref 0.0–0.2)

## 2020-04-25 LAB — CMP (CANCER CENTER ONLY)
ALT: 53 U/L — ABNORMAL HIGH (ref 0–44)
AST: 38 U/L (ref 15–41)
Albumin: 4.2 g/dL (ref 3.5–5.0)
Alkaline Phosphatase: 51 U/L (ref 38–126)
Anion gap: 9 (ref 5–15)
BUN: 15 mg/dL (ref 6–20)
CO2: 23 mmol/L (ref 22–32)
Calcium: 8.3 mg/dL — ABNORMAL LOW (ref 8.9–10.3)
Chloride: 103 mmol/L (ref 98–111)
Creatinine: 1.34 mg/dL — ABNORMAL HIGH (ref 0.61–1.24)
GFR, Estimated: >60 mL/min (ref >60)
Glucose, Bld: 105 mg/dL — ABNORMAL HIGH (ref 70–99)
Potassium: 3.9 mmol/L (ref 3.5–5.1)
Sodium: 135 mmol/L (ref 135–145)
Total Bilirubin: 0.5 mg/dL (ref 0.3–1.2)
Total Protein: 6.9 g/dL (ref 6.5–8.1)

## 2020-04-25 MED ORDER — BORTEZOMIB CHEMO SQ INJECTION 3.5 MG (2.5MG/ML)
3.0000 mg | Freq: Once | INTRAMUSCULAR | Status: AC
  Administered 2020-04-25: 3 mg via SUBCUTANEOUS
  Filled 2020-04-25: qty 1.2

## 2020-04-25 NOTE — Patient Instructions (Addendum)
Bortezomib injection What is this medicine? BORTEZOMIB (bor TEZ oh mib) is a medicine that targets proteins in cancer cells and stops the cancer cells from growing. It is used to treat multiple myeloma and mantle-cell lymphoma. This medicine may be used for other purposes; ask your health care provider or pharmacist if you have questions. COMMON BRAND NAME(S): Velcade What should I tell my health care provider before I take this medicine? They need to know if you have any of these conditions:  diabetes  heart disease  irregular heartbeat  liver disease  on hemodialysis  low blood counts, like low white blood cells, platelets, or hemoglobin  peripheral neuropathy  taking medicine for blood pressure  an unusual or allergic reaction to bortezomib, mannitol, boron, other medicines, foods, dyes, or preservatives  pregnant or trying to get pregnant  breast-feeding How should I use this medicine? This medicine is for injection into a vein or for injection under the skin. It is given by a health care professional in a hospital or clinic setting. Talk to your pediatrician regarding the use of this medicine in children. Special care may be needed. Overdosage: If you think you have taken too much of this medicine contact a poison control center or emergency room at once. NOTE: This medicine is only for you. Do not share this medicine with others. What if I miss a dose? It is important not to miss your dose. Call your doctor or health care professional if you are unable to keep an appointment. What may interact with this medicine? This medicine may interact with the following medications:  ketoconazole  rifampin  ritonavir  St. John's Wort This list may not describe all possible interactions. Give your health care provider a list of all the medicines, herbs, non-prescription drugs, or dietary supplements you use. Also tell them if you smoke, drink alcohol, or use illegal drugs. Some  items may interact with your medicine. What should I watch for while using this medicine? You may get drowsy or dizzy. Do not drive, use machinery, or do anything that needs mental alertness until you know how this medicine affects you. Do not stand or sit up quickly, especially if you are an older patient. This reduces the risk of dizzy or fainting spells. In some cases, you may be given additional medicines to help with side effects. Follow all directions for their use. Call your doctor or health care professional for advice if you get a fever, chills or sore throat, or other symptoms of a cold or flu. Do not treat yourself. This drug decreases your body's ability to fight infections. Try to avoid being around people who are sick. This medicine may increase your risk to bruise or bleed. Call your doctor or health care professional if you notice any unusual bleeding. You may need blood work done while you are taking this medicine. In some patients, this medicine may cause a serious brain infection that may cause death. If you have any problems seeing, thinking, speaking, walking, or standing, tell your doctor right away. If you cannot reach your doctor, urgently seek other source of medical care. Check with your doctor or health care professional if you get an attack of severe diarrhea, nausea and vomiting, or if you sweat a lot. The loss of too much body fluid can make it dangerous for you to take this medicine. Do not become pregnant while taking this medicine or for at least 7 months after stopping it. Women should inform their doctor   if they wish to become pregnant or think they might be pregnant. Men should not father a child while taking this medicine and for at least 4 months after stopping it. There is a potential for serious side effects to an unborn child. Talk to your health care professional or pharmacist for more information. Do not breast-feed an infant while taking this medicine or for 2  months after stopping it. This medicine may interfere with the ability to have a child. You should talk with your doctor or health care professional if you are concerned about your fertility. What side effects may I notice from receiving this medicine? Side effects that you should report to your doctor or health care professional as soon as possible:  allergic reactions like skin rash, itching or hives, swelling of the face, lips, or tongue  breathing problems  changes in hearing  changes in vision  fast, irregular heartbeat  feeling faint or lightheaded, falls  pain, tingling, numbness in the hands or feet  right upper belly pain  seizures  swelling of the ankles, feet, hands  unusual bleeding or bruising  unusually weak or tired  vomiting  yellowing of the eyes or skin Side effects that usually do not require medical attention (report to your doctor or health care professional if they continue or are bothersome):  changes in emotions or moods  constipation  diarrhea  loss of appetite  headache  irritation at site where injected  nausea This list may not describe all possible side effects. Call your doctor for medical advice about side effects. You may report side effects to FDA at 1-800-FDA-1088. Where should I keep my medicine? This drug is given in a hospital or clinic and will not be stored at home. NOTE: This sheet is a summary. It may not cover all possible information. If you have questions about this medicine, talk to your doctor, pharmacist, or health care provider.  2020 Elsevier/Gold Standard (2017-08-15 16:29:31) Pt discharged in no apparent distress. Pt left ambulatory without assistance. Pt aware of discharge instructions and verbalized understanding and had no further questions.

## 2020-04-28 ENCOUNTER — Other Ambulatory Visit: Payer: Self-pay | Admitting: *Deleted

## 2020-04-28 DIAGNOSIS — C9002 Multiple myeloma in relapse: Secondary | ICD-10-CM

## 2020-04-28 MED ORDER — LENALIDOMIDE 10 MG PO CAPS: ORAL_CAPSULE | ORAL | 0 refills | Status: DC

## 2020-05-09 ENCOUNTER — Other Ambulatory Visit: Payer: Self-pay

## 2020-05-09 ENCOUNTER — Inpatient Hospital Stay (HOSPITAL_BASED_OUTPATIENT_CLINIC_OR_DEPARTMENT_OTHER): Payer: Medicare Other | Admitting: Hematology & Oncology

## 2020-05-09 ENCOUNTER — Inpatient Hospital Stay: Payer: Medicare Other

## 2020-05-09 ENCOUNTER — Encounter: Payer: Self-pay | Admitting: Hematology & Oncology

## 2020-05-09 VITALS — BP 137/94 | HR 51 | Temp 98.3°F | Resp 18 | Wt 214.0 lb

## 2020-05-09 DIAGNOSIS — Z79899 Other long term (current) drug therapy: Secondary | ICD-10-CM | POA: Diagnosis not present

## 2020-05-09 DIAGNOSIS — C9001 Multiple myeloma in remission: Secondary | ICD-10-CM

## 2020-05-09 DIAGNOSIS — Z5112 Encounter for antineoplastic immunotherapy: Secondary | ICD-10-CM | POA: Diagnosis not present

## 2020-05-09 LAB — CBC WITH DIFFERENTIAL (CANCER CENTER ONLY)
Abs Immature Granulocytes: 0.01 10*3/uL (ref 0.00–0.07)
Basophils Absolute: 0 10*3/uL (ref 0.0–0.1)
Basophils Relative: 1 %
Eosinophils Absolute: 0.1 10*3/uL (ref 0.0–0.5)
Eosinophils Relative: 6 %
HCT: 36.5 % — ABNORMAL LOW (ref 39.0–52.0)
Hemoglobin: 12.8 g/dL — ABNORMAL LOW (ref 13.0–17.0)
Immature Granulocytes: 0 %
Lymphocytes Relative: 31 %
Lymphs Abs: 0.7 10*3/uL (ref 0.7–4.0)
MCH: 35.8 pg — ABNORMAL HIGH (ref 26.0–34.0)
MCHC: 35.1 g/dL (ref 30.0–36.0)
MCV: 102 fL — ABNORMAL HIGH (ref 80.0–100.0)
Monocytes Absolute: 0.3 10*3/uL (ref 0.1–1.0)
Monocytes Relative: 14 %
Neutro Abs: 1.1 10*3/uL — ABNORMAL LOW (ref 1.7–7.7)
Neutrophils Relative %: 48 %
Platelet Count: 139 10*3/uL — ABNORMAL LOW (ref 150–400)
RBC: 3.58 MIL/uL — ABNORMAL LOW (ref 4.22–5.81)
RDW: 13.2 % (ref 11.5–15.5)
WBC Count: 2.4 10*3/uL — ABNORMAL LOW (ref 4.0–10.5)
nRBC: 0 % (ref 0.0–0.2)

## 2020-05-09 LAB — CMP (CANCER CENTER ONLY)
ALT: 40 U/L (ref 0–44)
AST: 26 U/L (ref 15–41)
Albumin: 4.4 g/dL (ref 3.5–5.0)
Alkaline Phosphatase: 50 U/L (ref 38–126)
Anion gap: 6 (ref 5–15)
BUN: 17 mg/dL (ref 6–20)
CO2: 27 mmol/L (ref 22–32)
Calcium: 9.3 mg/dL (ref 8.9–10.3)
Chloride: 103 mmol/L (ref 98–111)
Creatinine: 1.38 mg/dL — ABNORMAL HIGH (ref 0.61–1.24)
GFR, Estimated: >60 mL/min (ref >60)
Glucose, Bld: 84 mg/dL (ref 70–99)
Potassium: 4.1 mmol/L (ref 3.5–5.1)
Sodium: 136 mmol/L (ref 135–145)
Total Bilirubin: 0.6 mg/dL (ref 0.3–1.2)
Total Protein: 7.4 g/dL (ref 6.5–8.1)

## 2020-05-09 LAB — LACTATE DEHYDROGENASE: LDH: 139 U/L (ref 98–192)

## 2020-05-09 MED ORDER — BORTEZOMIB CHEMO SQ INJECTION 3.5 MG (2.5MG/ML)
3.0000 mg | Freq: Once | INTRAMUSCULAR | Status: AC
  Administered 2020-05-09: 3 mg via SUBCUTANEOUS
  Filled 2020-05-09: qty 1.2

## 2020-05-09 NOTE — Progress Notes (Signed)
Reviewed pt labs with Dr. Ennever and pt ok to treat with ANC 1.1 

## 2020-05-09 NOTE — Patient Instructions (Signed)
[facility] Discharge Instructions for Patients Receiving Chemotherapy  Today you received the following chemotherapy agents Velcade To help prevent nausea and vomiting after your treatment, we encourage you to take your nausea medication as prescribed.   If you develop nausea and vomiting that is not controlled by your nausea medication, call the clinic.   BELOW ARE SYMPTOMS THAT SHOULD BE REPORTED IMMEDIATELY:  *FEVER GREATER THAN 100.5 F  *CHILLS WITH OR WITHOUT FEVER  NAUSEA AND VOMITING THAT IS NOT CONTROLLED WITH YOUR NAUSEA MEDICATION  *UNUSUAL SHORTNESS OF BREATH  *UNUSUAL BRUISING OR BLEEDING  TENDERNESS IN MOUTH AND THROAT WITH OR WITHOUT PRESENCE OF ULCERS  *URINARY PROBLEMS  *BOWEL PROBLEMS  UNUSUAL RASH Items with * indicate a potential emergency and should be followed up as soon as possible.  Feel free to call the clinic should you have any questions or concerns. The clinic phone number is [phone].  Please show the CHEMO ALERT CARD at check-in to the Emergency Department and triage nurse.   

## 2020-05-09 NOTE — Progress Notes (Signed)
Hematology and Oncology Follow Up Visit  Tyler Phillips:[id number] 1965-05-23 55 y.o. 05/09/2020   Principle Diagnosis:  IgG Kappa myeloma - +4, +14, +17  Past Therapy: Status post autologous stem cell transplant on 05/22/2015 S/pcycle 6 of RVD Ninlaro 4 mg po q 2 week -- start on 01/02/2019 -- d/c on 02/10/2019  Current Therapy: Velcade q 2wk dosing Revlimid 10mg  po q day (21/7)   Interim History:  [name] is here today for follow-up.  He enjoyed the holidays.  He had his children come in.  Both he and his wife enjoyed this.  As such, he is quite busy.  He is doing a lot of things this year.  He will be doing some traveling.  He has had no issues with respect to the Velcade or the Revlimid.  We are watching his kappa light chains.  His kappa light chains went up just a little bit last time.  They went from 36.7 up to 40.6 mg/L.  There has been no issues with nausea or vomiting.  He had no rashes.  He has had no problems with his bowels or bladder.  There is been no issues with leg swelling.  He is not exercising much as he would like.  He apparently had pneumonia back in December.  He is going try to get back into exercising.  He does Cross[employer].  He has had no headache.    His performance status is ECOG 0.    Medications:  Allergies as of 05/09/2020   No Known Allergies     Medication List       Accurate as of May 09, 2020 10:39 AM. If you have any questions, ask your nurse or doctor.        aspirin 325 MG tablet Take 325 mg by mouth daily.   bortezomib IV 3.5 MG injection Commonly known as: VELCADE Inject into the vein every 14 (fourteen) days.   cetirizine 10 MG tablet Commonly known as: ZYRTEC Take 10 mg by mouth daily.   clindamycin 300 MG capsule Commonly known as: CLEOCIN Take 300 mg by mouth 4 (four) times daily. Prior to dental procedures.   lenalidomide 10 MG capsule Commonly known as: Revlimid TAKE 1 CAPSULE BY MOUTH  DAILY  FOR 21 DAYS ON, THEN  7 DAYS OFF Auth [id number]   LORazepam 1 MG tablet Commonly known as: ATIVAN Take 1 tablet (1 mg total) by mouth every 4 (four) hours as needed.   valACYclovir 500 MG tablet Commonly known as: VALTREX Take 500 mg by mouth 2 (two) times daily.   zolpidem 10 MG tablet Commonly known as: AMBIEN Take 1 tablet (10 mg total) by mouth at bedtime as needed for sleep.       Allergies: No Known Allergies  Past Medical History, Surgical history, Social history, and Family History were reviewed and updated.  Review of Systems: Review of Systems  Constitutional: Negative.   HENT: Negative.   Eyes: Negative.   Respiratory: Negative.   Cardiovascular: Negative.   Gastrointestinal: Negative.   Genitourinary: Negative.   Musculoskeletal: Negative.   Skin: Negative.   Neurological: Negative.   Endo/Heme/Allergies: Negative.   Psychiatric/Behavioral: Negative.      Physical Exam:  weight is 214 lb (97.1 kg). His oral temperature is 98.3 F (36.8 C). His blood pressure is 137/94 (abnormal) and his pulse is 51 (abnormal). His respiration is 18 and oxygen saturation is 100%.   Wt Readings from Last 3 Encounters:  05/09/20 214  lb (97.1 kg)  04/04/20 212 lb (96.2 kg)  03/21/20 209 lb 12.8 oz (95.2 kg)   Physical Exam Vitals reviewed.  HENT:     Head: Normocephalic and atraumatic.  Eyes:     Pupils: Pupils are equal, round, and reactive to light.  Cardiovascular:     Rate and Rhythm: Normal rate and regular rhythm.     Heart sounds: Normal heart sounds.  Pulmonary:     Effort: Pulmonary effort is normal.     Breath sounds: Normal breath sounds.  Abdominal:     General: Bowel sounds are normal.     Palpations: Abdomen is soft.  Musculoskeletal:        General: No tenderness or deformity. Normal range of motion.     Cervical back: Normal range of motion.  Lymphadenopathy:     Cervical: No cervical adenopathy.  Skin:    General: Skin is warm and dry.      Findings: No erythema or rash.  Neurological:     Mental Status: He is alert and oriented to person, place, and time.  Psychiatric:        Behavior: Behavior normal.        Thought Content: Thought content normal.        Judgment: Judgment normal.      Lab Results  Component Value Date   WBC 2.4 (L) 05/09/2020   HGB 12.8 (L) 05/09/2020   HCT 36.5 (L) 05/09/2020   MCV 102.0 (H) 05/09/2020   PLT 139 (L) 05/09/2020   Lab Results  Component Value Date   FERRITIN 1,263 (H) 07/20/2014   IRON 125 07/20/2014   TIBC 198 (L) 07/20/2014   UIBC 73 (L) 07/20/2014   IRONPCTSAT 63 (H) 07/20/2014   Lab Results  Component Value Date   RETICCTPCT 0.8 07/20/2014   RBC 3.58 (L) 05/09/2020   Lab Results  Component Value Date   KPAFRELGTCHN 40.6 (H) 04/04/2020   LAMBDASER 27.0 (H) 04/04/2020   KAPLAMBRATIO 1.50 04/04/2020   Lab Results  Component Value Date   IGGSERUM 1,202 04/04/2020   IGA 194 04/04/2020   IGMSERUM 18 (L) 04/04/2020   Lab Results  Component Value Date   TOTALPROTELP 6.7 04/04/2020   ALBUMINELP 3.8 04/04/2020   A1GS 0.2 04/04/2020   A2GS 0.6 04/04/2020   BETS 0.9 04/04/2020   BETA2SER 0.3 03/28/2015   GAMS 1.2 04/04/2020   MSPIKE Not Observed 04/04/2020   SPEI Comment 01/25/2020     Chemistry      Component Value Date/Time   NA 136 05/09/2020 0936   NA 140 04/22/2017 1140   NA 138 09/03/2015 1042   K 4.1 05/09/2020 0936   K 4.0 04/22/2017 1140   K 4.3 09/03/2015 1042   CL 103 05/09/2020 0936   CL 105 04/22/2017 1140   CO2 27 05/09/2020 0936   CO2 24 04/22/2017 1140   CO2 25 09/03/2015 1042   BUN 17 05/09/2020 0936   BUN 16 04/22/2017 1140   BUN 21.7 09/03/2015 1042   CREATININE 1.38 (H) 05/09/2020 0936   CREATININE 1.6 (H) 04/22/2017 1140   CREATININE 1.2 09/03/2015 1042      Component Value Date/Time   CALCIUM 9.3 05/09/2020 0936   CALCIUM 8.5 04/22/2017 1140   CALCIUM 9.1 09/03/2015 1042   ALKPHOS 50 05/09/2020 0936   ALKPHOS 59  04/22/2017 1140   ALKPHOS 42 09/03/2015 1042   AST 26 05/09/2020 0936   AST 27 09/03/2015 1042   ALT 40 05/09/2020  0936   ALT 45 04/22/2017 1140   ALT 41 09/03/2015 1042   BILITOT 0.6 05/09/2020 0936   BILITOT 0.54 09/03/2015 1042       Impression and Plan: [name] is a very pleasant 55 yo caucasian gentleman withIgG kappa myeloma. He underwent induction chemotherapy with RVD followed by an autologous stem cell transplant at [facility] on February 2017.  We still have to watch  his Kappa light chain closely.  Hopefully, we will see stability.   He really has done nicely.    I will plan to see him back in  1 month.    [name], MD 1/21/202210:39 AM

## 2020-05-10 ENCOUNTER — Encounter: Payer: Self-pay | Admitting: Hematology & Oncology

## 2020-05-11 LAB — IGG, IGA, IGM
IgA: 231 mg/dL (ref 90–386)
IgG (Immunoglobin G), Serum: 1327 mg/dL (ref 603–1613)
IgM (Immunoglobulin M), Srm: 20 mg/dL (ref 20–172)

## 2020-05-12 ENCOUNTER — Encounter: Payer: Self-pay | Admitting: *Deleted

## 2020-05-12 LAB — PROTEIN ELECTROPHORESIS, SERUM, WITH REFLEX
A/G Ratio: 1.3 (ref 0.7–1.7)
Albumin ELP: 4.1 g/dL (ref 2.9–4.4)
Alpha-1-Globulin: 0.2 g/dL (ref 0.0–0.4)
Alpha-2-Globulin: 0.7 g/dL (ref 0.4–1.0)
Beta Globulin: 0.9 g/dL (ref 0.7–1.3)
Gamma Globulin: 1.4 g/dL (ref 0.4–1.8)
Globulin, Total: 3.2 g/dL (ref 2.2–3.9)
Total Protein ELP: 7.3 g/dL (ref 6.0–8.5)

## 2020-05-12 LAB — KAPPA/LAMBDA LIGHT CHAINS
Kappa free light chain: 42.3 mg/L — ABNORMAL HIGH (ref 3.3–19.4)
Kappa, lambda light chain ratio: 1.3 (ref 0.26–1.65)
Lambda free light chains: 32.5 mg/L — ABNORMAL HIGH (ref 5.7–26.3)

## 2020-05-22 ENCOUNTER — Other Ambulatory Visit: Payer: Self-pay | Admitting: *Deleted

## 2020-05-22 DIAGNOSIS — C9001 Multiple myeloma in remission: Secondary | ICD-10-CM

## 2020-05-23 ENCOUNTER — Inpatient Hospital Stay: Payer: Medicare Other | Attending: Hematology & Oncology

## 2020-05-23 ENCOUNTER — Inpatient Hospital Stay: Payer: Medicare Other

## 2020-05-23 ENCOUNTER — Other Ambulatory Visit: Payer: Self-pay

## 2020-05-23 VITALS — BP 151/86 | HR 58 | Temp 97.5°F | Resp 19

## 2020-05-23 DIAGNOSIS — C9001 Multiple myeloma in remission: Secondary | ICD-10-CM

## 2020-05-23 DIAGNOSIS — Z79899 Other long term (current) drug therapy: Secondary | ICD-10-CM | POA: Diagnosis not present

## 2020-05-23 DIAGNOSIS — Z5112 Encounter for antineoplastic immunotherapy: Secondary | ICD-10-CM | POA: Diagnosis not present

## 2020-05-23 DIAGNOSIS — C9000 Multiple myeloma not having achieved remission: Secondary | ICD-10-CM | POA: Insufficient documentation

## 2020-05-23 LAB — CBC WITH DIFFERENTIAL (CANCER CENTER ONLY)
Abs Immature Granulocytes: 0 10*3/uL (ref 0.00–0.07)
Basophils Absolute: 0 10*3/uL (ref 0.0–0.1)
Basophils Relative: 1 %
Eosinophils Absolute: 0.1 10*3/uL (ref 0.0–0.5)
Eosinophils Relative: 3 %
HCT: 36 % — ABNORMAL LOW (ref 39.0–52.0)
Hemoglobin: 12.7 g/dL — ABNORMAL LOW (ref 13.0–17.0)
Immature Granulocytes: 0 %
Lymphocytes Relative: 27 %
Lymphs Abs: 0.7 10*3/uL (ref 0.7–4.0)
MCH: 35.7 pg — ABNORMAL HIGH (ref 26.0–34.0)
MCHC: 35.3 g/dL (ref 30.0–36.0)
MCV: 101.1 fL — ABNORMAL HIGH (ref 80.0–100.0)
Monocytes Absolute: 0.2 10*3/uL (ref 0.1–1.0)
Monocytes Relative: 10 %
Neutro Abs: 1.5 10*3/uL — ABNORMAL LOW (ref 1.7–7.7)
Neutrophils Relative %: 59 %
Platelet Count: 160 10*3/uL (ref 150–400)
RBC: 3.56 MIL/uL — ABNORMAL LOW (ref 4.22–5.81)
RDW: 13.1 % (ref 11.5–15.5)
WBC Count: 2.4 10*3/uL — ABNORMAL LOW (ref 4.0–10.5)
nRBC: 0 % (ref 0.0–0.2)

## 2020-05-23 LAB — CMP (CANCER CENTER ONLY)
ALT: 66 U/L — ABNORMAL HIGH (ref 0–44)
AST: 49 U/L — ABNORMAL HIGH (ref 15–41)
Albumin: 4.4 g/dL (ref 3.5–5.0)
Alkaline Phosphatase: 49 U/L (ref 38–126)
Anion gap: 8 (ref 5–15)
BUN: 16 mg/dL (ref 6–20)
CO2: 23 mmol/L (ref 22–32)
Calcium: 9 mg/dL (ref 8.9–10.3)
Chloride: 105 mmol/L (ref 98–111)
Creatinine: 1.32 mg/dL — ABNORMAL HIGH (ref 0.61–1.24)
GFR, Estimated: >60 mL/min (ref >60)
Glucose, Bld: 85 mg/dL (ref 70–99)
Potassium: 4.4 mmol/L (ref 3.5–5.1)
Sodium: 136 mmol/L (ref 135–145)
Total Bilirubin: 0.5 mg/dL (ref 0.3–1.2)
Total Protein: 7 g/dL (ref 6.5–8.1)

## 2020-05-23 MED ORDER — BORTEZOMIB CHEMO SQ INJECTION 3.5 MG (2.5MG/ML)
3.0000 mg | Freq: Once | INTRAMUSCULAR | Status: AC
  Administered 2020-05-23: 3 mg via SUBCUTANEOUS
  Filled 2020-05-23: qty 1.2

## 2020-05-23 NOTE — Patient Instructions (Signed)
[facility] Discharge Instructions for Patients Receiving Chemotherapy  Today you received the following chemotherapy agents Velcade To help prevent nausea and vomiting after your treatment, we encourage you to take your nausea medication as prescribed.   If you develop nausea and vomiting that is not controlled by your nausea medication, call the clinic.   BELOW ARE SYMPTOMS THAT SHOULD BE REPORTED IMMEDIATELY:  *FEVER GREATER THAN 100.5 F  *CHILLS WITH OR WITHOUT FEVER  NAUSEA AND VOMITING THAT IS NOT CONTROLLED WITH YOUR NAUSEA MEDICATION  *UNUSUAL SHORTNESS OF BREATH  *UNUSUAL BRUISING OR BLEEDING  TENDERNESS IN MOUTH AND THROAT WITH OR WITHOUT PRESENCE OF ULCERS  *URINARY PROBLEMS  *BOWEL PROBLEMS  UNUSUAL RASH Items with * indicate a potential emergency and should be followed up as soon as possible.  Feel free to call the clinic should you have any questions or concerns. The clinic phone number is [phone].  Please show the CHEMO ALERT CARD at check-in to the Emergency Department and triage nurse.   

## 2020-06-01 ENCOUNTER — Other Ambulatory Visit: Payer: Self-pay | Admitting: Hematology & Oncology

## 2020-06-01 DIAGNOSIS — C9002 Multiple myeloma in relapse: Secondary | ICD-10-CM

## 2020-06-03 ENCOUNTER — Other Ambulatory Visit: Payer: Self-pay | Admitting: *Deleted

## 2020-06-03 DIAGNOSIS — C9002 Multiple myeloma in relapse: Secondary | ICD-10-CM

## 2020-06-03 MED ORDER — LENALIDOMIDE 10 MG PO CAPS: ORAL_CAPSULE | ORAL | 0 refills | Status: DC

## 2020-06-06 ENCOUNTER — Inpatient Hospital Stay (HOSPITAL_BASED_OUTPATIENT_CLINIC_OR_DEPARTMENT_OTHER): Payer: Medicare Other | Admitting: Hematology & Oncology

## 2020-06-06 ENCOUNTER — Inpatient Hospital Stay: Payer: Medicare Other

## 2020-06-06 ENCOUNTER — Encounter: Payer: Self-pay | Admitting: Hematology & Oncology

## 2020-06-06 ENCOUNTER — Telehealth: Payer: Self-pay

## 2020-06-06 ENCOUNTER — Other Ambulatory Visit: Payer: Self-pay

## 2020-06-06 VITALS — BP 140/86 | HR 57 | Temp 98.3°F | Resp 16 | Wt 215.0 lb

## 2020-06-06 DIAGNOSIS — C9001 Multiple myeloma in remission: Secondary | ICD-10-CM

## 2020-06-06 DIAGNOSIS — Z5112 Encounter for antineoplastic immunotherapy: Secondary | ICD-10-CM | POA: Diagnosis not present

## 2020-06-06 DIAGNOSIS — C9000 Multiple myeloma not having achieved remission: Secondary | ICD-10-CM | POA: Diagnosis not present

## 2020-06-06 DIAGNOSIS — Z79899 Other long term (current) drug therapy: Secondary | ICD-10-CM | POA: Diagnosis not present

## 2020-06-06 LAB — CBC WITH DIFFERENTIAL (CANCER CENTER ONLY)
Abs Immature Granulocytes: 0 10*3/uL (ref 0.00–0.07)
Basophils Absolute: 0 10*3/uL (ref 0.0–0.1)
Basophils Relative: 0 %
Eosinophils Absolute: 0.1 10*3/uL (ref 0.0–0.5)
Eosinophils Relative: 3 %
HCT: 34.7 % — ABNORMAL LOW (ref 39.0–52.0)
Hemoglobin: 12.3 g/dL — ABNORMAL LOW (ref 13.0–17.0)
Immature Granulocytes: 0 %
Lymphocytes Relative: 28 %
Lymphs Abs: 0.7 10*3/uL (ref 0.7–4.0)
MCH: 35.5 pg — ABNORMAL HIGH (ref 26.0–34.0)
MCHC: 35.4 g/dL (ref 30.0–36.0)
MCV: 100.3 fL — ABNORMAL HIGH (ref 80.0–100.0)
Monocytes Absolute: 0.2 10*3/uL (ref 0.1–1.0)
Monocytes Relative: 9 %
Neutro Abs: 1.5 10*3/uL — ABNORMAL LOW (ref 1.7–7.7)
Neutrophils Relative %: 60 %
Platelet Count: 135 10*3/uL — ABNORMAL LOW (ref 150–400)
RBC: 3.46 MIL/uL — ABNORMAL LOW (ref 4.22–5.81)
RDW: 12.7 % (ref 11.5–15.5)
WBC Count: 2.5 10*3/uL — ABNORMAL LOW (ref 4.0–10.5)
nRBC: 0 % (ref 0.0–0.2)

## 2020-06-06 LAB — CMP (CANCER CENTER ONLY)
ALT: 52 U/L — ABNORMAL HIGH (ref 0–44)
AST: 31 U/L (ref 15–41)
Albumin: 4.4 g/dL (ref 3.5–5.0)
Alkaline Phosphatase: 46 U/L (ref 38–126)
Anion gap: 9 (ref 5–15)
BUN: 15 mg/dL (ref 6–20)
CO2: 26 mmol/L (ref 22–32)
Calcium: 9.4 mg/dL (ref 8.9–10.3)
Chloride: 104 mmol/L (ref 98–111)
Creatinine: 1.34 mg/dL — ABNORMAL HIGH (ref 0.61–1.24)
GFR, Estimated: >60 mL/min (ref >60)
Glucose, Bld: 123 mg/dL — ABNORMAL HIGH (ref 70–99)
Potassium: 3.9 mmol/L (ref 3.5–5.1)
Sodium: 139 mmol/L (ref 135–145)
Total Bilirubin: 0.6 mg/dL (ref 0.3–1.2)
Total Protein: 7.1 g/dL (ref 6.5–8.1)

## 2020-06-06 LAB — LACTATE DEHYDROGENASE: LDH: 165 U/L (ref 98–192)

## 2020-06-06 MED ORDER — BORTEZOMIB CHEMO SQ INJECTION 3.5 MG (2.5MG/ML)
3.0000 mg | Freq: Once | INTRAMUSCULAR | Status: AC
  Administered 2020-06-06: 3 mg via SUBCUTANEOUS
  Filled 2020-06-06: qty 1.2

## 2020-06-06 NOTE — Progress Notes (Signed)
Hematology and Oncology Follow Up Visit  Tyler Phillips [id number] 1965-05-07 55 y.o. 06/06/2020   Principle Diagnosis:  IgG Kappa myeloma - +4, +14, +17  Past Therapy: Status post autologous stem cell transplant on 05/22/2015 S/pcycle 6 of RVD Ninlaro 4 mg po q 2 week -- start on 01/02/2019 -- d/c on 02/10/2019  Current Therapy: Velcade q 2wk dosing Revlimid 10mg  po q day (21/7)   Interim History:  Tyler Phillips is here today for follow-up.  He is doing quite well.  He really has had no problems with protocol.  He did have his yearly appointment with [facility].  This was a virtual type of visit.  They were happy with his progress.  They did not think that he had to make any changes with the Velcade/Revlimid.  He has had no problems with cough or shortness of breath.  He has had no nausea or vomiting.  He has had no change in bowel or bladder habits.  He and his wife were recently up in [place].  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  They go up to every now then to make sure everything is going well.  We have been watching his light chain.  His last kappa light chain was 4.2 mg/dL which is relatively stable.  He has had no problems with rashes.  He has had no bleeding.  Overall, his performance status is ECOG 0.   Medications:  Allergies as of 06/06/2020   No Known Allergies     Medication List       Accurate as of June 06, 2020  1:57 PM. If you have any questions, ask your nurse or doctor.        aspirin 325 MG tablet Take 325 mg by mouth daily.   bortezomib IV 3.5 MG injection Commonly known as: VELCADE Inject into the vein every 14 (fourteen) days.   cetirizine 10 MG tablet Commonly known as: ZYRTEC Take 10 mg by mouth daily.   clindamycin 300 MG capsule Commonly known as: CLEOCIN Take 300 mg by mouth 4 (four) times daily. Prior to dental procedures.   lenalidomide 10 MG capsule Commonly known as: Revlimid TAKE 1 CAPSULE BY MOUTH  DAILY FOR 21 DAYS,  THEN 7  DAYS OFF Auth# [id number]   LORazepam 1 MG tablet Commonly known as: ATIVAN Take 1 tablet (1 mg total) by mouth every 4 (four) hours as needed.   valACYclovir 500 MG tablet Commonly known as: VALTREX Take 500 mg by mouth 2 (two) times daily.   zolpidem 10 MG tablet Commonly known as: AMBIEN Take 1 tablet (10 mg total) by mouth at bedtime as needed for sleep.       Allergies: No Known Allergies  Past Medical History, Surgical history, Social history, and Family History were reviewed and updated.  Review of Systems: Review of Systems  Constitutional: Negative.   HENT: Negative.   Eyes: Negative.   Respiratory: Negative.   Cardiovascular: Negative.   Gastrointestinal: Negative.   Genitourinary: Negative.   Musculoskeletal: Negative.   Skin: Negative.   Neurological: Negative.   Endo/Heme/Allergies: Negative.   Psychiatric/Behavioral: Negative.      Physical Exam:  weight is 215 lb (97.5 kg). His oral temperature is 98.3 F (36.8 C). His blood pressure is 140/86 and his pulse is 57 (abnormal). His respiration is 16 and oxygen saturation is 98%.   Wt Readings from Last 3 Encounters:  06/06/20 215 lb (97.5 kg)  05/09/20 214 lb (97.1 kg)  04/04/20  212 lb (96.2 kg)   Physical Exam Vitals reviewed.  HENT:     Head: Normocephalic and atraumatic.  Eyes:     Pupils: Pupils are equal, round, and reactive to light.  Cardiovascular:     Rate and Rhythm: Normal rate and regular rhythm.     Heart sounds: Normal heart sounds.  Pulmonary:     Effort: Pulmonary effort is normal.     Breath sounds: Normal breath sounds.  Abdominal:     General: Bowel sounds are normal.     Palpations: Abdomen is soft.  Musculoskeletal:        General: No tenderness or deformity. Normal range of motion.     Cervical back: Normal range of motion.  Lymphadenopathy:     Cervical: No cervical adenopathy.  Skin:    General: Skin is warm and dry.     Findings: No erythema or rash.   Neurological:     Mental Status: He is alert and oriented to person, place, and time.  Psychiatric:        Behavior: Behavior normal.        Thought Content: Thought content normal.        Judgment: Judgment normal.      Lab Results  Component Value Date   WBC 2.5 (L) 06/06/2020   HGB 12.3 (L) 06/06/2020   HCT 34.7 (L) 06/06/2020   MCV 100.3 (H) 06/06/2020   PLT 135 (L) 06/06/2020   Lab Results  Component Value Date   FERRITIN 1,263 (H) 07/20/2014   IRON 125 07/20/2014   TIBC 198 (L) 07/20/2014   UIBC 73 (L) 07/20/2014   IRONPCTSAT 63 (H) 07/20/2014   Lab Results  Component Value Date   RETICCTPCT 0.8 07/20/2014   RBC 3.46 (L) 06/06/2020   Lab Results  Component Value Date   KPAFRELGTCHN 42.3 (H) 05/09/2020   LAMBDASER 32.5 (H) 05/09/2020   KAPLAMBRATIO 1.30 05/09/2020   Lab Results  Component Value Date   IGGSERUM 1,327 05/09/2020   IGA 231 05/09/2020   IGMSERUM 20 05/09/2020   Lab Results  Component Value Date   TOTALPROTELP 7.3 05/09/2020   ALBUMINELP 4.1 05/09/2020   A1GS 0.2 05/09/2020   A2GS 0.7 05/09/2020   BETS 0.9 05/09/2020   BETA2SER 0.3 03/28/2015   GAMS 1.4 05/09/2020   MSPIKE Not Observed 05/09/2020   SPEI Comment 01/25/2020     Chemistry      Component Value Date/Time   NA 139 06/06/2020 1302   NA 140 04/22/2017 1140   NA 138 09/03/2015 1042   K 3.9 06/06/2020 1302   K 4.0 04/22/2017 1140   K 4.3 09/03/2015 1042   CL 104 06/06/2020 1302   CL 105 04/22/2017 1140   CO2 26 06/06/2020 1302   CO2 24 04/22/2017 1140   CO2 25 09/03/2015 1042   BUN 15 06/06/2020 1302   BUN 16 04/22/2017 1140   BUN 21.7 09/03/2015 1042   CREATININE 1.34 (H) 06/06/2020 1302   CREATININE 1.6 (H) 04/22/2017 1140   CREATININE 1.2 09/03/2015 1042      Component Value Date/Time   CALCIUM 9.4 06/06/2020 1302   CALCIUM 8.5 04/22/2017 1140   CALCIUM 9.1 09/03/2015 1042   ALKPHOS 46 06/06/2020 1302   ALKPHOS 59 04/22/2017 1140   ALKPHOS 42 09/03/2015  1042   AST 31 06/06/2020 1302   AST 27 09/03/2015 1042   ALT 52 (H) 06/06/2020 1302   ALT 45 04/22/2017 1140   ALT 41 09/03/2015 1042  BILITOT 0.6 06/06/2020 1302   BILITOT 0.54 09/03/2015 1042       Impression and Plan: Tyler Phillips is a very pleasant 55 yo caucasian gentleman withIgG kappa myeloma. He underwent induction chemotherapy with RVD followed by an autologous stem cell transplant at [facility] on February 2017.  I am very happy that everything is going well so far.  He really has done nicely.  Again, the biology of his myeloma is such that he is just very chemo responsive.  We will continue him on the Velcade every other week.  He does his Revlimid 3 weeks on and 1 week off.  I will plan to see him back in 6 weeks since everything is going well for him.   [name], MD 2/18/20221:57 PM

## 2020-06-06 NOTE — Telephone Encounter (Signed)
appts made for pt per 06/06/20 los, pt to rec sch in tx/avs     Tyler Phillips

## 2020-06-06 NOTE — Patient Instructions (Signed)
Bortezomib injection What is this medicine? BORTEZOMIB (bor TEZ oh mib) targets proteins in cancer cells and stops the cancer cells from growing. It treats multiple myeloma and mantle cell lymphoma. This medicine may be used for other purposes; ask your health care provider or pharmacist if you have questions. COMMON BRAND NAME(S): Velcade What should I tell my health care provider before I take this medicine? They need to know if you have any of these conditions:  dehydration  diabetes (high blood sugar)  heart disease  liver disease  tingling of the fingers or toes or other nerve disorder  an unusual or allergic reaction to bortezomib, mannitol, boron, other medicines, foods, dyes, or preservatives  pregnant or trying to get pregnant  breast-feeding How should I use this medicine? This medicine is injected into a vein or under the skin. It is given by a health care provider in a hospital or clinic setting. Talk to your health care provider about the use of this medicine in children. Special care may be needed. Overdosage: If you think you have taken too much of this medicine contact a poison control center or emergency room at once. NOTE: This medicine is only for you. Do not share this medicine with others. What if I miss a dose? Keep appointments for follow-up doses. It is important not to miss your dose. Call your health care provider if you are unable to keep an appointment. What may interact with this medicine? This medicine may interact with the following medications:  ketoconazole  rifampin This list may not describe all possible interactions. Give your health care provider a list of all the medicines, herbs, non-prescription drugs, or dietary supplements you use. Also tell them if you smoke, drink alcohol, or use illegal drugs. Some items may interact with your medicine. What should I watch for while using this medicine? Your condition will be monitored carefully while  you are receiving this medicine. You may need blood work done while you are taking this medicine. You may get drowsy or dizzy. Do not drive, use machinery, or do anything that needs mental alertness until you know how this medicine affects you. Do not stand up or sit up quickly, especially if you are an older patient. This reduces the risk of dizzy or fainting spells This medicine may increase your risk of getting an infection. Call your health care provider for advice if you get a fever, chills, sore throat, or other symptoms of a cold or flu. Do not treat yourself. Try to avoid being around people who are sick. Check with your health care provider if you have severe diarrhea, nausea, and vomiting, or if you sweat a lot. The loss of too much body fluid may make it dangerous for you to take this medicine. Do not become pregnant while taking this medicine or for 7 months after stopping it. Women should inform their health care provider if they wish to become pregnant or think they might be pregnant. Men should not father a child while taking this medicine and for 4 months after stopping it. There is a potential for serious harm to an unborn child. Talk to your health care provider for more information. Do not breast-feed an infant while taking this medicine or for 2 months after stopping it. This medicine may make it more difficult to get pregnant or father a child. Talk to your health care provider if you are concerned about your fertility. What side effects may I notice from receiving this medicine?   Side effects that you should report to your doctor or health care professional as soon as possible:  allergic reactions (skin rash; itching or hives; swelling of the face, lips, or tongue)  bleeding (bloody or black, tarry stools; red or dark brown urine; spitting up blood or brown material that looks like coffee grounds; red spots on the skin; unusual bruising or bleeding from the eye, gums, or  nose)  blurred vision or changes in vision  confusion  constipation  headache  heart failure (trouble breathing; fast, irregular heartbeat; sudden weight gain; swelling of the ankles, feet, hands)  infection (fever, chills, cough, sore throat, pain or trouble passing urine)  lack or loss of appetite  liver injury (dark yellow or brown urine; general ill feeling or flu-like symptoms; loss of appetite, right upper belly pain; yellowing of the eyes or skin)  low blood pressure (dizziness; feeling faint or lightheaded, falls; unusually weak or tired)  muscle cramps  pain, redness, or irritation at site where injected  pain, tingling, numbness in the hands or feet  seizures  trouble breathing  unusual bruising or bleeding Side effects that usually do not require medical attention (report to your doctor or health care professional if they continue or are bothersome):  diarrhea  nausea  stomach pain  trouble sleeping  vomiting This list may not describe all possible side effects. Call your doctor for medical advice about side effects. You may report side effects to FDA at 1-800-FDA-1088. Where should I keep my medicine? This medicine is given in a hospital or clinic. It will not be stored at home. NOTE: This sheet is a summary. It may not cover all possible information. If you have questions about this medicine, talk to your doctor, pharmacist, or health care provider.  2021 [employer]/Gold Standard (2020-03-27 13:22:53)

## 2020-06-07 LAB — IGG, IGA, IGM
IgA: 215 mg/dL (ref 90–386)
IgG (Immunoglobin G), Serum: 1313 mg/dL (ref 603–1613)
IgM (Immunoglobulin M), Srm: 17 mg/dL — ABNORMAL LOW (ref 20–172)

## 2020-06-09 LAB — PROTEIN ELECTROPHORESIS, SERUM, WITH REFLEX
A/G Ratio: 1.3 (ref 0.7–1.7)
Albumin ELP: 4 g/dL (ref 2.9–4.4)
Alpha-1-Globulin: 0.2 g/dL (ref 0.0–0.4)
Alpha-2-Globulin: 0.6 g/dL (ref 0.4–1.0)
Beta Globulin: 1 g/dL (ref 0.7–1.3)
Gamma Globulin: 1.3 g/dL (ref 0.4–1.8)
Globulin, Total: 3 g/dL (ref 2.2–3.9)
Total Protein ELP: 7 g/dL (ref 6.0–8.5)

## 2020-06-09 LAB — KAPPA/LAMBDA LIGHT CHAINS
Kappa free light chain: 40.6 mg/L — ABNORMAL HIGH (ref 3.3–19.4)
Kappa, lambda light chain ratio: 1.44 (ref 0.26–1.65)
Lambda free light chains: 28.1 mg/L — ABNORMAL HIGH (ref 5.7–26.3)

## 2020-06-16 ENCOUNTER — Telehealth: Payer: Self-pay

## 2020-06-16 NOTE — Telephone Encounter (Signed)
Pt called in to cancel his 06/20/20 lab and chemo and req to skip this dose as he will be out of town.  States he has skipped in the past and is normally ok with his dx     [place]

## 2020-06-20 ENCOUNTER — Inpatient Hospital Stay: Payer: Medicare Other

## 2020-06-30 ENCOUNTER — Other Ambulatory Visit: Payer: Self-pay | Admitting: Hematology & Oncology

## 2020-06-30 DIAGNOSIS — C9002 Multiple myeloma in relapse: Secondary | ICD-10-CM

## 2020-07-01 ENCOUNTER — Other Ambulatory Visit: Payer: Self-pay | Admitting: *Deleted

## 2020-07-01 DIAGNOSIS — C9002 Multiple myeloma in relapse: Secondary | ICD-10-CM

## 2020-07-01 MED ORDER — LENALIDOMIDE 10 MG PO CAPS: ORAL_CAPSULE | ORAL | 0 refills | Status: DC

## 2020-07-04 ENCOUNTER — Inpatient Hospital Stay: Payer: Medicare Other | Attending: Hematology & Oncology

## 2020-07-04 ENCOUNTER — Other Ambulatory Visit: Payer: Self-pay

## 2020-07-04 ENCOUNTER — Inpatient Hospital Stay: Payer: Medicare Other

## 2020-07-04 VITALS — BP 149/94 | HR 57 | Temp 97.6°F | Resp 18

## 2020-07-04 DIAGNOSIS — C9001 Multiple myeloma in remission: Secondary | ICD-10-CM

## 2020-07-04 DIAGNOSIS — C9000 Multiple myeloma not having achieved remission: Secondary | ICD-10-CM | POA: Insufficient documentation

## 2020-07-04 DIAGNOSIS — Z5112 Encounter for antineoplastic immunotherapy: Secondary | ICD-10-CM | POA: Insufficient documentation

## 2020-07-04 LAB — CBC WITH DIFFERENTIAL (CANCER CENTER ONLY)
Abs Immature Granulocytes: 0 10*3/uL (ref 0.00–0.07)
Basophils Absolute: 0 10*3/uL (ref 0.0–0.1)
Basophils Relative: 0 %
Eosinophils Absolute: 0.1 10*3/uL (ref 0.0–0.5)
Eosinophils Relative: 5 %
HCT: 36.6 % — ABNORMAL LOW (ref 39.0–52.0)
Hemoglobin: 13.3 g/dL (ref 13.0–17.0)
Immature Granulocytes: 0 %
Lymphocytes Relative: 38 %
Lymphs Abs: 0.9 10*3/uL (ref 0.7–4.0)
MCH: 35.9 pg — ABNORMAL HIGH (ref 26.0–34.0)
MCHC: 36.3 g/dL — ABNORMAL HIGH (ref 30.0–36.0)
MCV: 98.9 fL (ref 80.0–100.0)
Monocytes Absolute: 0.3 10*3/uL (ref 0.1–1.0)
Monocytes Relative: 12 %
Neutro Abs: 1.1 10*3/uL — ABNORMAL LOW (ref 1.7–7.7)
Neutrophils Relative %: 45 %
Platelet Count: 121 10*3/uL — ABNORMAL LOW (ref 150–400)
RBC: 3.7 MIL/uL — ABNORMAL LOW (ref 4.22–5.81)
RDW: 12.7 % (ref 11.5–15.5)
WBC Count: 2.4 10*3/uL — ABNORMAL LOW (ref 4.0–10.5)
nRBC: 0 % (ref 0.0–0.2)

## 2020-07-04 LAB — CMP (CANCER CENTER ONLY)
ALT: 53 U/L — ABNORMAL HIGH (ref 0–44)
AST: 38 U/L (ref 15–41)
Albumin: 4.4 g/dL (ref 3.5–5.0)
Alkaline Phosphatase: 53 U/L (ref 38–126)
Anion gap: 7 (ref 5–15)
BUN: 13 mg/dL (ref 6–20)
CO2: 24 mmol/L (ref 22–32)
Calcium: 9.2 mg/dL (ref 8.9–10.3)
Chloride: 106 mmol/L (ref 98–111)
Creatinine: 1.4 mg/dL — ABNORMAL HIGH (ref 0.61–1.24)
GFR, Estimated: 59 mL/min — ABNORMAL LOW (ref >60)
Glucose, Bld: 87 mg/dL (ref 70–99)
Potassium: 4.1 mmol/L (ref 3.5–5.1)
Sodium: 137 mmol/L (ref 135–145)
Total Bilirubin: 0.5 mg/dL (ref 0.3–1.2)
Total Protein: 7.1 g/dL (ref 6.5–8.1)

## 2020-07-04 MED ORDER — BORTEZOMIB CHEMO SQ INJECTION 3.5 MG (2.5MG/ML)
3.0000 mg | Freq: Once | INTRAMUSCULAR | Status: AC
  Administered 2020-07-04: 3 mg via SUBCUTANEOUS
  Filled 2020-07-04: qty 1.2

## 2020-07-04 NOTE — Progress Notes (Signed)
Okay to treat today with ANC 1.1 per Laverna Peace, NP.

## 2020-07-04 NOTE — Patient Instructions (Signed)
Bortezomib injection What is this medicine? BORTEZOMIB (bor TEZ oh mib) targets proteins in cancer cells and stops the cancer cells from growing. It treats multiple myeloma and mantle cell lymphoma. This medicine may be used for other purposes; ask your health care provider or pharmacist if you have questions. COMMON BRAND NAME(S): Velcade What should I tell my health care provider before I take this medicine? They need to know if you have any of these conditions:  dehydration  diabetes (high blood sugar)  heart disease  liver disease  tingling of the fingers or toes or other nerve disorder  an unusual or allergic reaction to bortezomib, mannitol, boron, other medicines, foods, dyes, or preservatives  pregnant or trying to get pregnant  breast-feeding How should I use this medicine? This medicine is injected into a vein or under the skin. It is given by a health care provider in a hospital or clinic setting. Talk to your health care provider about the use of this medicine in children. Special care may be needed. Overdosage: If you think you have taken too much of this medicine contact a poison control center or emergency room at once. NOTE: This medicine is only for you. Do not share this medicine with others. What if I miss a dose? Keep appointments for follow-up doses. It is important not to miss your dose. Call your health care provider if you are unable to keep an appointment. What may interact with this medicine? This medicine may interact with the following medications:  ketoconazole  rifampin This list may not describe all possible interactions. Give your health care provider a list of all the medicines, herbs, non-prescription drugs, or dietary supplements you use. Also tell them if you smoke, drink alcohol, or use illegal drugs. Some items may interact with your medicine. What should I watch for while using this medicine? Your condition will be monitored carefully while  you are receiving this medicine. You may need blood work done while you are taking this medicine. You may get drowsy or dizzy. Do not drive, use machinery, or do anything that needs mental alertness until you know how this medicine affects you. Do not stand up or sit up quickly, especially if you are an older patient. This reduces the risk of dizzy or fainting spells This medicine may increase your risk of getting an infection. Call your health care provider for advice if you get a fever, chills, sore throat, or other symptoms of a cold or flu. Do not treat yourself. Try to avoid being around people who are sick. Check with your health care provider if you have severe diarrhea, nausea, and vomiting, or if you sweat a lot. The loss of too much body fluid may make it dangerous for you to take this medicine. Do not become pregnant while taking this medicine or for 7 months after stopping it. Women should inform their health care provider if they wish to become pregnant or think they might be pregnant. Men should not father a child while taking this medicine and for 4 months after stopping it. There is a potential for serious harm to an unborn child. Talk to your health care provider for more information. Do not breast-feed an infant while taking this medicine or for 2 months after stopping it. This medicine may make it more difficult to get pregnant or father a child. Talk to your health care provider if you are concerned about your fertility. What side effects may I notice from receiving this medicine?   Side effects that you should report to your doctor or health care professional as soon as possible:  allergic reactions (skin rash; itching or hives; swelling of the face, lips, or tongue)  bleeding (bloody or black, tarry stools; red or dark brown urine; spitting up blood or brown material that looks like coffee grounds; red spots on the skin; unusual bruising or bleeding from the eye, gums, or  nose)  blurred vision or changes in vision  confusion  constipation  headache  heart failure (trouble breathing; fast, irregular heartbeat; sudden weight gain; swelling of the ankles, feet, hands)  infection (fever, chills, cough, sore throat, pain or trouble passing urine)  lack or loss of appetite  liver injury (dark yellow or brown urine; general ill feeling or flu-like symptoms; loss of appetite, right upper belly pain; yellowing of the eyes or skin)  low blood pressure (dizziness; feeling faint or lightheaded, falls; unusually weak or tired)  muscle cramps  pain, redness, or irritation at site where injected  pain, tingling, numbness in the hands or feet  seizures  trouble breathing  unusual bruising or bleeding Side effects that usually do not require medical attention (report to your doctor or health care professional if they continue or are bothersome):  diarrhea  nausea  stomach pain  trouble sleeping  vomiting This list may not describe all possible side effects. Call your doctor for medical advice about side effects. You may report side effects to FDA at 1-800-FDA-1088. Where should I keep my medicine? This medicine is given in a hospital or clinic. It will not be stored at home. NOTE: This sheet is a summary. It may not cover all possible information. If you have questions about this medicine, talk to your doctor, pharmacist, or health care provider.  2021 [employer]/Gold Standard (2020-03-27 13:22:53)

## 2020-07-18 ENCOUNTER — Inpatient Hospital Stay: Payer: Medicare Other

## 2020-07-18 ENCOUNTER — Inpatient Hospital Stay (HOSPITAL_BASED_OUTPATIENT_CLINIC_OR_DEPARTMENT_OTHER): Payer: Medicare Other | Admitting: Hematology & Oncology

## 2020-07-18 ENCOUNTER — Encounter: Payer: Self-pay | Admitting: Hematology & Oncology

## 2020-07-18 ENCOUNTER — Other Ambulatory Visit: Payer: Self-pay

## 2020-07-18 ENCOUNTER — Inpatient Hospital Stay: Payer: Medicare Other | Attending: Hematology & Oncology

## 2020-07-18 VITALS — BP 145/89 | HR 72 | Temp 98.1°F | Resp 18 | Wt 216.0 lb

## 2020-07-18 DIAGNOSIS — C9000 Multiple myeloma not having achieved remission: Secondary | ICD-10-CM | POA: Diagnosis not present

## 2020-07-18 DIAGNOSIS — Z5112 Encounter for antineoplastic immunotherapy: Secondary | ICD-10-CM | POA: Diagnosis not present

## 2020-07-18 DIAGNOSIS — Z79899 Other long term (current) drug therapy: Secondary | ICD-10-CM | POA: Diagnosis not present

## 2020-07-18 DIAGNOSIS — C9001 Multiple myeloma in remission: Secondary | ICD-10-CM

## 2020-07-18 LAB — CMP (CANCER CENTER ONLY)
ALT: 71 U/L — ABNORMAL HIGH (ref 0–44)
AST: 53 U/L — ABNORMAL HIGH (ref 15–41)
Albumin: 4.3 g/dL (ref 3.5–5.0)
Alkaline Phosphatase: 49 U/L (ref 38–126)
Anion gap: 8 (ref 5–15)
BUN: 13 mg/dL (ref 6–20)
CO2: 24 mmol/L (ref 22–32)
Calcium: 9.1 mg/dL (ref 8.9–10.3)
Chloride: 107 mmol/L (ref 98–111)
Creatinine: 1.45 mg/dL — ABNORMAL HIGH (ref 0.61–1.24)
GFR, Estimated: 57 mL/min — ABNORMAL LOW (ref >60)
Glucose, Bld: 66 mg/dL — ABNORMAL LOW (ref 70–99)
Potassium: 4.8 mmol/L (ref 3.5–5.1)
Sodium: 139 mmol/L (ref 135–145)
Total Bilirubin: 0.6 mg/dL (ref 0.3–1.2)
Total Protein: 7.1 g/dL (ref 6.5–8.1)

## 2020-07-18 LAB — CBC WITH DIFFERENTIAL (CANCER CENTER ONLY)
Abs Immature Granulocytes: 0 10*3/uL (ref 0.00–0.07)
Basophils Absolute: 0 10*3/uL (ref 0.0–0.1)
Basophils Relative: 1 %
Eosinophils Absolute: 0.1 10*3/uL (ref 0.0–0.5)
Eosinophils Relative: 2 %
HCT: 36.2 % — ABNORMAL LOW (ref 39.0–52.0)
Hemoglobin: 13.1 g/dL (ref 13.0–17.0)
Immature Granulocytes: 0 %
Lymphocytes Relative: 29 %
Lymphs Abs: 0.9 10*3/uL (ref 0.7–4.0)
MCH: 36.2 pg — ABNORMAL HIGH (ref 26.0–34.0)
MCHC: 36.2 g/dL — ABNORMAL HIGH (ref 30.0–36.0)
MCV: 100 fL (ref 80.0–100.0)
Monocytes Absolute: 0.3 10*3/uL (ref 0.1–1.0)
Monocytes Relative: 8 %
Neutro Abs: 1.9 10*3/uL (ref 1.7–7.7)
Neutrophils Relative %: 60 %
Platelet Count: 140 10*3/uL — ABNORMAL LOW (ref 150–400)
RBC: 3.62 MIL/uL — ABNORMAL LOW (ref 4.22–5.81)
RDW: 13 % (ref 11.5–15.5)
WBC Count: 3.1 10*3/uL — ABNORMAL LOW (ref 4.0–10.5)
nRBC: 0 % (ref 0.0–0.2)

## 2020-07-18 LAB — LACTATE DEHYDROGENASE: LDH: 186 U/L (ref 98–192)

## 2020-07-18 MED ORDER — BORTEZOMIB CHEMO SQ INJECTION 3.5 MG (2.5MG/ML)
3.0000 mg | Freq: Once | INTRAMUSCULAR | Status: AC
  Administered 2020-07-18: 3 mg via SUBCUTANEOUS
  Filled 2020-07-18: qty 1.2

## 2020-07-18 NOTE — Progress Notes (Signed)
Hematology and Oncology Follow Up Visit  Tyler Phillips [id number] Aug 24, 1965 55 y.o. 07/18/2020   Principle Diagnosis:  IgG Kappa myeloma - +4, +14, +17  Past Therapy: Status post autologous stem cell transplant on 05/22/2015 S/pcycle 6 of RVD Ninlaro 4 mg po q 2 week -- start on 01/02/2019 -- d/c on 02/10/2019  Current Therapy: Velcade q 2wk dosing Revlimid 10mg  po q day (21/7)   Interim History:  [name] is here today for follow-up.  He is doing quite well.  He really has had no problems with the Velcade/Revlimid protocol.  His liver tests are slightly elevated.  This is happened before.  His renal function is also slightly elevated but again, he has had this before.  This really is not a problem for [place].  So far, there is been no evidence of his myeloma becoming a problem.  We are watching his light chains closely.  His kappa light chains have been holding pretty steady.  There is been no problems with nausea or vomiting.  He has had no fatigue or weakness.  He has had no cough or shortness of breath.  There is no issues with leg swelling.  He has had no rashes.  As always, he is family have been traveling.  They have been down to [place].  Have a house on the [place].  They enjoy this.  He has had no fever.  There is been no bleeding.  His appetite has been quite good.  Overall, his performance status is ECOG 0.    Medications:  Allergies as of 07/18/2020   No Known Allergies     Medication List       Accurate as of July 18, 2020 12:39 PM. If you have any questions, ask your nurse or doctor.        aspirin 325 MG tablet Take 325 mg by mouth daily.   bortezomib IV 3.5 MG injection Commonly known as: VELCADE Inject into the vein every 14 (fourteen) days.   cetirizine 10 MG tablet Commonly known as: ZYRTEC Take 10 mg by mouth daily.   clindamycin 300 MG capsule Commonly known as: CLEOCIN Take 300 mg by mouth 4 (four) times daily. Prior to  dental procedures.   lenalidomide 10 MG capsule Commonly known as: Revlimid TAKE 1 CAPSULE BY MOUTH  DAILY FOR 21 DAYS ON, THEN  7 DAYS OFF [id number]   LORazepam 1 MG tablet Commonly known as: ATIVAN Take 1 tablet (1 mg total) by mouth every 4 (four) hours as needed.   valACYclovir 500 MG tablet Commonly known as: VALTREX Take 500 mg by mouth 2 (two) times daily.   zolpidem 10 MG tablet Commonly known as: AMBIEN Take 1 tablet (10 mg total) by mouth at bedtime as needed for sleep.       Allergies: No Known Allergies  Past Medical History, Surgical history, Social history, and Family History were reviewed and updated.  Review of Systems: Review of Systems  Constitutional: Negative.   HENT: Negative.   Eyes: Negative.   Respiratory: Negative.   Cardiovascular: Negative.   Gastrointestinal: Negative.   Genitourinary: Negative.   Musculoskeletal: Negative.   Skin: Negative.   Neurological: Negative.   Endo/Heme/Allergies: Negative.   Psychiatric/Behavioral: Negative.      Physical Exam:  weight is 216 lb (98 kg). His oral temperature is 98.1 F (36.7 C). His blood pressure is 145/89 (abnormal) and his pulse is 72. His respiration is 18 and oxygen saturation is 100%.  Wt Readings from Last 3 Encounters:  07/18/20 216 lb (98 kg)  06/06/20 215 lb (97.5 kg)  05/09/20 214 lb (97.1 kg)   Physical Exam Vitals reviewed.  HENT:     Head: Normocephalic and atraumatic.  Eyes:     Pupils: Pupils are equal, round, and reactive to light.  Cardiovascular:     Rate and Rhythm: Normal rate and regular rhythm.     Heart sounds: Normal heart sounds.  Pulmonary:     Effort: Pulmonary effort is normal.     Breath sounds: Normal breath sounds.  Abdominal:     General: Bowel sounds are normal.     Palpations: Abdomen is soft.  Musculoskeletal:        General: No tenderness or deformity. Normal range of motion.     Cervical back: Normal range of motion.  Lymphadenopathy:      Cervical: No cervical adenopathy.  Skin:    General: Skin is warm and dry.     Findings: No erythema or rash.  Neurological:     Mental Status: He is alert and oriented to person, place, and time.  Psychiatric:        Behavior: Behavior normal.        Thought Content: Thought content normal.        Judgment: Judgment normal.      Lab Results  Component Value Date   WBC 3.1 (L) 07/18/2020   HGB 13.1 07/18/2020   HCT 36.2 (L) 07/18/2020   MCV 100.0 07/18/2020   PLT 140 (L) 07/18/2020   Lab Results  Component Value Date   FERRITIN 1,263 (H) 07/20/2014   IRON 125 07/20/2014   TIBC 198 (L) 07/20/2014   UIBC 73 (L) 07/20/2014   IRONPCTSAT 63 (H) 07/20/2014   Lab Results  Component Value Date   RETICCTPCT 0.8 07/20/2014   RBC 3.62 (L) 07/18/2020   Lab Results  Component Value Date   KPAFRELGTCHN 40.6 (H) 06/06/2020   LAMBDASER 28.1 (H) 06/06/2020   KAPLAMBRATIO 1.44 06/06/2020   Lab Results  Component Value Date   IGGSERUM 1,313 06/06/2020   IGA 215 06/06/2020   IGMSERUM 17 (L) 06/06/2020   Lab Results  Component Value Date   TOTALPROTELP 7.0 06/06/2020   ALBUMINELP 4.0 06/06/2020   A1GS 0.2 06/06/2020   A2GS 0.6 06/06/2020   BETS 1.0 06/06/2020   BETA2SER 0.3 03/28/2015   GAMS 1.3 06/06/2020   MSPIKE Not Observed 06/06/2020   SPEI Comment 01/25/2020     Chemistry      Component Value Date/Time   NA 139 07/18/2020 1155   NA 140 04/22/2017 1140   NA 138 09/03/2015 1042   K 4.8 07/18/2020 1155   K 4.0 04/22/2017 1140   K 4.3 09/03/2015 1042   CL 107 07/18/2020 1155   CL 105 04/22/2017 1140   CO2 24 07/18/2020 1155   CO2 24 04/22/2017 1140   CO2 25 09/03/2015 1042   BUN 13 07/18/2020 1155   BUN 16 04/22/2017 1140   BUN 21.7 09/03/2015 1042   CREATININE 1.45 (H) 07/18/2020 1155   CREATININE 1.6 (H) 04/22/2017 1140   CREATININE 1.2 09/03/2015 1042      Component Value Date/Time   CALCIUM 9.1 07/18/2020 1155   CALCIUM 8.5 04/22/2017 1140    CALCIUM 9.1 09/03/2015 1042   ALKPHOS 49 07/18/2020 1155   ALKPHOS 59 04/22/2017 1140   ALKPHOS 42 09/03/2015 1042   AST 53 (H) 07/18/2020 1155   AST 27 09/03/2015  1042   ALT 71 (H) 07/18/2020 1155   ALT 45 04/22/2017 1140   ALT 41 09/03/2015 1042   BILITOT 0.6 07/18/2020 1155   BILITOT 0.54 09/03/2015 1042       Impression and Plan: [name] is a very pleasant 55 yo caucasian gentleman withIgG kappa myeloma. He underwent induction chemotherapy with RVD followed by an autologous stem cell transplant at [facility] on February 2017.  I am very happy that everything is going well so far.  He really has done nicely.  Again, the biology of his myeloma is such that he is just very chemo responsive.  We will continue him on the Velcade every other week.  He does his Revlimid 3 weeks on and 1 week off.  I will plan to see him back in 6 weeks since everything is going well for him.   [name], MD 4/1/202212:39 PM

## 2020-07-18 NOTE — Patient Instructions (Signed)
[facility] Discharge Instructions for Patients Receiving Chemotherapy  Today you received the following chemotherapy agents Velcade To help prevent nausea and vomiting after your treatment, we encourage you to take your nausea medication as prescribed.   If you develop nausea and vomiting that is not controlled by your nausea medication, call the clinic.   BELOW ARE SYMPTOMS THAT SHOULD BE REPORTED IMMEDIATELY:  *FEVER GREATER THAN 100.5 F  *CHILLS WITH OR WITHOUT FEVER  NAUSEA AND VOMITING THAT IS NOT CONTROLLED WITH YOUR NAUSEA MEDICATION  *UNUSUAL SHORTNESS OF BREATH  *UNUSUAL BRUISING OR BLEEDING  TENDERNESS IN MOUTH AND THROAT WITH OR WITHOUT PRESENCE OF ULCERS  *URINARY PROBLEMS  *BOWEL PROBLEMS  UNUSUAL RASH Items with * indicate a potential emergency and should be followed up as soon as possible.  Feel free to call the clinic should you have any questions or concerns. The clinic phone number is [phone].  Please show the CHEMO ALERT CARD at check-in to the Emergency Department and triage nurse.   

## 2020-07-19 LAB — IGG, IGA, IGM
IgA: 232 mg/dL (ref 90–386)
IgG (Immunoglobin G), Serum: 1382 mg/dL (ref 603–1613)
IgM (Immunoglobulin M), Srm: 20 mg/dL (ref 20–172)

## 2020-07-21 LAB — PROTEIN ELECTROPHORESIS, SERUM, WITH REFLEX
A/G Ratio: 1.4 (ref 0.7–1.7)
Albumin ELP: 4.1 g/dL (ref 2.9–4.4)
Alpha-1-Globulin: 0.2 g/dL (ref 0.0–0.4)
Alpha-2-Globulin: 0.5 g/dL (ref 0.4–1.0)
Beta Globulin: 1 g/dL (ref 0.7–1.3)
Gamma Globulin: 1.3 g/dL (ref 0.4–1.8)
Globulin, Total: 3 g/dL (ref 2.2–3.9)
Total Protein ELP: 7.1 g/dL (ref 6.0–8.5)

## 2020-07-21 LAB — KAPPA/LAMBDA LIGHT CHAINS
Kappa free light chain: 36.3 mg/L — ABNORMAL HIGH (ref 3.3–19.4)
Kappa, lambda light chain ratio: 1.36 (ref 0.26–1.65)
Lambda free light chains: 26.6 mg/L — ABNORMAL HIGH (ref 5.7–26.3)

## 2020-07-28 ENCOUNTER — Other Ambulatory Visit: Payer: Self-pay | Admitting: *Deleted

## 2020-07-28 ENCOUNTER — Other Ambulatory Visit: Payer: Self-pay | Admitting: Hematology & Oncology

## 2020-07-28 DIAGNOSIS — C9002 Multiple myeloma in relapse: Secondary | ICD-10-CM

## 2020-07-28 MED ORDER — LENALIDOMIDE 10 MG PO CAPS: ORAL_CAPSULE | ORAL | 0 refills | Status: DC

## 2020-08-01 ENCOUNTER — Other Ambulatory Visit: Payer: Self-pay

## 2020-08-01 ENCOUNTER — Inpatient Hospital Stay: Payer: Medicare Other

## 2020-08-01 VITALS — BP 130/85 | HR 64 | Temp 97.6°F | Resp 17

## 2020-08-01 DIAGNOSIS — C9000 Multiple myeloma not having achieved remission: Secondary | ICD-10-CM

## 2020-08-01 DIAGNOSIS — C9001 Multiple myeloma in remission: Secondary | ICD-10-CM

## 2020-08-01 DIAGNOSIS — Z5112 Encounter for antineoplastic immunotherapy: Secondary | ICD-10-CM | POA: Diagnosis not present

## 2020-08-01 DIAGNOSIS — Z79899 Other long term (current) drug therapy: Secondary | ICD-10-CM | POA: Diagnosis not present

## 2020-08-01 LAB — CBC WITH DIFFERENTIAL (CANCER CENTER ONLY)
Abs Immature Granulocytes: 0 10*3/uL (ref 0.00–0.07)
Basophils Absolute: 0 10*3/uL (ref 0.0–0.1)
Basophils Relative: 0 %
Eosinophils Absolute: 0.1 10*3/uL (ref 0.0–0.5)
Eosinophils Relative: 5 %
HCT: 35.9 % — ABNORMAL LOW (ref 39.0–52.0)
Hemoglobin: 13.1 g/dL (ref 13.0–17.0)
Immature Granulocytes: 0 %
Lymphocytes Relative: 37 %
Lymphs Abs: 1 10*3/uL (ref 0.7–4.0)
MCH: 35.9 pg — ABNORMAL HIGH (ref 26.0–34.0)
MCHC: 36.5 g/dL — ABNORMAL HIGH (ref 30.0–36.0)
MCV: 98.4 fL (ref 80.0–100.0)
Monocytes Absolute: 0.2 10*3/uL (ref 0.1–1.0)
Monocytes Relative: 9 %
Neutro Abs: 1.3 10*3/uL — ABNORMAL LOW (ref 1.7–7.7)
Neutrophils Relative %: 49 %
Platelet Count: 119 10*3/uL — ABNORMAL LOW (ref 150–400)
RBC: 3.65 MIL/uL — ABNORMAL LOW (ref 4.22–5.81)
RDW: 12.9 % (ref 11.5–15.5)
WBC Count: 2.7 10*3/uL — ABNORMAL LOW (ref 4.0–10.5)
nRBC: 0 % (ref 0.0–0.2)

## 2020-08-01 LAB — CMP (CANCER CENTER ONLY)
ALT: 52 U/L — ABNORMAL HIGH (ref 0–44)
AST: 27 U/L (ref 15–41)
Albumin: 4.2 g/dL (ref 3.5–5.0)
Alkaline Phosphatase: 50 U/L (ref 38–126)
Anion gap: 9 (ref 5–15)
BUN: 14 mg/dL (ref 6–20)
CO2: 22 mmol/L (ref 22–32)
Calcium: 8.9 mg/dL (ref 8.9–10.3)
Chloride: 104 mmol/L (ref 98–111)
Creatinine: 1.35 mg/dL — ABNORMAL HIGH (ref 0.61–1.24)
GFR, Estimated: >60 mL/min (ref >60)
Glucose, Bld: 153 mg/dL — ABNORMAL HIGH (ref 70–99)
Potassium: 3.7 mmol/L (ref 3.5–5.1)
Sodium: 135 mmol/L (ref 135–145)
Total Bilirubin: 0.8 mg/dL (ref 0.3–1.2)
Total Protein: 6.6 g/dL (ref 6.5–8.1)

## 2020-08-01 MED ORDER — BORTEZOMIB CHEMO SQ INJECTION 3.5 MG (2.5MG/ML)
3.0000 mg | Freq: Once | INTRAMUSCULAR | Status: AC
  Administered 2020-08-01: 3 mg via SUBCUTANEOUS
  Filled 2020-08-01: qty 1.2

## 2020-08-01 NOTE — Patient Instructions (Signed)
Bortezomib injection What is this medicine? BORTEZOMIB (bor TEZ oh mib) targets proteins in cancer cells and stops the cancer cells from growing. It treats multiple myeloma and mantle cell lymphoma. This medicine may be used for other purposes; ask your health care provider or pharmacist if you have questions. COMMON BRAND NAME(S): Velcade What should I tell my health care provider before I take this medicine? They need to know if you have any of these conditions:  dehydration  diabetes (high blood sugar)  heart disease  liver disease  tingling of the fingers or toes or other nerve disorder  an unusual or allergic reaction to bortezomib, mannitol, boron, other medicines, foods, dyes, or preservatives  pregnant or trying to get pregnant  breast-feeding How should I use this medicine? This medicine is injected into a vein or under the skin. It is given by a health care provider in a hospital or clinic setting. Talk to your health care provider about the use of this medicine in children. Special care may be needed. Overdosage: If you think you have taken too much of this medicine contact a poison control center or emergency room at once. NOTE: This medicine is only for you. Do not share this medicine with others. What if I miss a dose? Keep appointments for follow-up doses. It is important not to miss your dose. Call your health care provider if you are unable to keep an appointment. What may interact with this medicine? This medicine may interact with the following medications:  ketoconazole  rifampin This list may not describe all possible interactions. Give your health care provider a list of all the medicines, herbs, non-prescription drugs, or dietary supplements you use. Also tell them if you smoke, drink alcohol, or use illegal drugs. Some items may interact with your medicine. What should I watch for while using this medicine? Your condition will be monitored carefully while  you are receiving this medicine. You may need blood work done while you are taking this medicine. You may get drowsy or dizzy. Do not drive, use machinery, or do anything that needs mental alertness until you know how this medicine affects you. Do not stand up or sit up quickly, especially if you are an older patient. This reduces the risk of dizzy or fainting spells This medicine may increase your risk of getting an infection. Call your health care provider for advice if you get a fever, chills, sore throat, or other symptoms of a cold or flu. Do not treat yourself. Try to avoid being around people who are sick. Check with your health care provider if you have severe diarrhea, nausea, and vomiting, or if you sweat a lot. The loss of too much body fluid may make it dangerous for you to take this medicine. Do not become pregnant while taking this medicine or for 7 months after stopping it. Women should inform their health care provider if they wish to become pregnant or think they might be pregnant. Men should not father a child while taking this medicine and for 4 months after stopping it. There is a potential for serious harm to an unborn child. Talk to your health care provider for more information. Do not breast-feed an infant while taking this medicine or for 2 months after stopping it. This medicine may make it more difficult to get pregnant or father a child. Talk to your health care provider if you are concerned about your fertility. What side effects may I notice from receiving this medicine?   Side effects that you should report to your doctor or health care professional as soon as possible:  allergic reactions (skin rash; itching or hives; swelling of the face, lips, or tongue)  bleeding (bloody or black, tarry stools; red or dark brown urine; spitting up blood or brown material that looks like coffee grounds; red spots on the skin; unusual bruising or bleeding from the eye, gums, or  nose)  blurred vision or changes in vision  confusion  constipation  headache  heart failure (trouble breathing; fast, irregular heartbeat; sudden weight gain; swelling of the ankles, feet, hands)  infection (fever, chills, cough, sore throat, pain or trouble passing urine)  lack or loss of appetite  liver injury (dark yellow or brown urine; general ill feeling or flu-like symptoms; loss of appetite, right upper belly pain; yellowing of the eyes or skin)  low blood pressure (dizziness; feeling faint or lightheaded, falls; unusually weak or tired)  muscle cramps  pain, redness, or irritation at site where injected  pain, tingling, numbness in the hands or feet  seizures  trouble breathing  unusual bruising or bleeding Side effects that usually do not require medical attention (report to your doctor or health care professional if they continue or are bothersome):  diarrhea  nausea  stomach pain  trouble sleeping  vomiting This list may not describe all possible side effects. Call your doctor for medical advice about side effects. You may report side effects to FDA at 1-800-FDA-1088. Where should I keep my medicine? This medicine is given in a hospital or clinic. It will not be stored at home. NOTE: This sheet is a summary. It may not cover all possible information. If you have questions about this medicine, talk to your doctor, pharmacist, or health care provider.  2021 [employer]/Gold Standard (2020-03-27 13:22:53)

## 2020-08-01 NOTE — Progress Notes (Signed)
Reviewed labwork with Dr [name].  Ok to treat today with ANC 1.3

## 2020-08-14 ENCOUNTER — Other Ambulatory Visit: Payer: Self-pay | Admitting: *Deleted

## 2020-08-14 DIAGNOSIS — C9001 Multiple myeloma in remission: Secondary | ICD-10-CM

## 2020-08-14 DIAGNOSIS — C9000 Multiple myeloma not having achieved remission: Secondary | ICD-10-CM

## 2020-08-15 ENCOUNTER — Inpatient Hospital Stay: Payer: Medicare Other

## 2020-08-15 ENCOUNTER — Other Ambulatory Visit: Payer: Self-pay

## 2020-08-15 VITALS — BP 143/83 | HR 63 | Temp 97.9°F | Resp 16

## 2020-08-15 DIAGNOSIS — C9000 Multiple myeloma not having achieved remission: Secondary | ICD-10-CM

## 2020-08-15 DIAGNOSIS — Z79899 Other long term (current) drug therapy: Secondary | ICD-10-CM | POA: Diagnosis not present

## 2020-08-15 DIAGNOSIS — C9001 Multiple myeloma in remission: Secondary | ICD-10-CM

## 2020-08-15 DIAGNOSIS — Z5112 Encounter for antineoplastic immunotherapy: Secondary | ICD-10-CM | POA: Diagnosis not present

## 2020-08-15 LAB — CBC WITH DIFFERENTIAL (CANCER CENTER ONLY)
Abs Immature Granulocytes: 0.01 10*3/uL (ref 0.00–0.07)
Basophils Absolute: 0 10*3/uL (ref 0.0–0.1)
Basophils Relative: 1 %
Eosinophils Absolute: 0.1 10*3/uL (ref 0.0–0.5)
Eosinophils Relative: 2 %
HCT: 36.5 % — ABNORMAL LOW (ref 39.0–52.0)
Hemoglobin: 13.4 g/dL (ref 13.0–17.0)
Immature Granulocytes: 0 %
Lymphocytes Relative: 31 %
Lymphs Abs: 1.1 10*3/uL (ref 0.7–4.0)
MCH: 35.8 pg — ABNORMAL HIGH (ref 26.0–34.0)
MCHC: 36.7 g/dL — ABNORMAL HIGH (ref 30.0–36.0)
MCV: 97.6 fL (ref 80.0–100.0)
Monocytes Absolute: 0.2 10*3/uL (ref 0.1–1.0)
Monocytes Relative: 6 %
Neutro Abs: 2 10*3/uL (ref 1.7–7.7)
Neutrophils Relative %: 60 %
Platelet Count: 152 10*3/uL (ref 150–400)
RBC: 3.74 MIL/uL — ABNORMAL LOW (ref 4.22–5.81)
RDW: 13.1 % (ref 11.5–15.5)
WBC Count: 3.4 10*3/uL — ABNORMAL LOW (ref 4.0–10.5)
nRBC: 0 % (ref 0.0–0.2)

## 2020-08-15 LAB — CMP (CANCER CENTER ONLY)
ALT: 56 U/L — ABNORMAL HIGH (ref 0–44)
AST: 33 U/L (ref 15–41)
Albumin: 4.5 g/dL (ref 3.5–5.0)
Alkaline Phosphatase: 54 U/L (ref 38–126)
Anion gap: 9 (ref 5–15)
BUN: 18 mg/dL (ref 6–20)
CO2: 23 mmol/L (ref 22–32)
Calcium: 9.3 mg/dL (ref 8.9–10.3)
Chloride: 104 mmol/L (ref 98–111)
Creatinine: 1.43 mg/dL — ABNORMAL HIGH (ref 0.61–1.24)
GFR, Estimated: 58 mL/min — ABNORMAL LOW (ref >60)
Glucose, Bld: 95 mg/dL (ref 70–99)
Potassium: 4.1 mmol/L (ref 3.5–5.1)
Sodium: 136 mmol/L (ref 135–145)
Total Bilirubin: 0.9 mg/dL (ref 0.3–1.2)
Total Protein: 7.3 g/dL (ref 6.5–8.1)

## 2020-08-15 MED ORDER — BORTEZOMIB CHEMO SQ INJECTION 3.5 MG (2.5MG/ML)
3.0000 mg | Freq: Once | INTRAMUSCULAR | Status: AC
  Administered 2020-08-15: 3 mg via SUBCUTANEOUS
  Filled 2020-08-15: qty 1.2

## 2020-08-15 NOTE — Patient Instructions (Signed)
[facility] Discharge Instructions for Patients Receiving Chemotherapy  Today you received the following chemotherapy agents Velcade To help prevent nausea and vomiting after your treatment, we encourage you to take your nausea medication as prescribed.   If you develop nausea and vomiting that is not controlled by your nausea medication, call the clinic.   BELOW ARE SYMPTOMS THAT SHOULD BE REPORTED IMMEDIATELY:  *FEVER GREATER THAN 100.5 F  *CHILLS WITH OR WITHOUT FEVER  NAUSEA AND VOMITING THAT IS NOT CONTROLLED WITH YOUR NAUSEA MEDICATION  *UNUSUAL SHORTNESS OF BREATH  *UNUSUAL BRUISING OR BLEEDING  TENDERNESS IN MOUTH AND THROAT WITH OR WITHOUT PRESENCE OF ULCERS  *URINARY PROBLEMS  *BOWEL PROBLEMS  UNUSUAL RASH Items with * indicate a potential emergency and should be followed up as soon as possible.  Feel free to call the clinic should you have any questions or concerns. The clinic phone number is [phone].  Please show the CHEMO ALERT CARD at check-in to the Emergency Department and triage nurse.   

## 2020-08-25 ENCOUNTER — Other Ambulatory Visit: Payer: Self-pay | Admitting: Hematology & Oncology

## 2020-08-25 ENCOUNTER — Other Ambulatory Visit: Payer: Self-pay | Admitting: *Deleted

## 2020-08-25 DIAGNOSIS — C9002 Multiple myeloma in relapse: Secondary | ICD-10-CM

## 2020-08-25 MED ORDER — LENALIDOMIDE 10 MG PO CAPS: ORAL_CAPSULE | ORAL | 0 refills | Status: DC

## 2020-08-27 ENCOUNTER — Telehealth: Payer: Self-pay

## 2020-08-27 NOTE — Telephone Encounter (Signed)
Pt had called  5/10 and left a vm that he needed to r/s his appts as he will be out of town, I called him back todayand left a vm as the day that he req is not avail, will await pts call   Estelle Greenleaf

## 2020-08-29 ENCOUNTER — Inpatient Hospital Stay: Payer: Medicare Other

## 2020-08-29 ENCOUNTER — Inpatient Hospital Stay: Payer: Medicare Other | Admitting: Hematology & Oncology

## 2020-09-08 ENCOUNTER — Other Ambulatory Visit: Payer: Self-pay | Admitting: *Deleted

## 2020-09-08 DIAGNOSIS — C9001 Multiple myeloma in remission: Secondary | ICD-10-CM

## 2020-09-09 ENCOUNTER — Inpatient Hospital Stay: Payer: Medicare Other

## 2020-09-09 ENCOUNTER — Inpatient Hospital Stay: Payer: Medicare Other | Attending: Hematology & Oncology

## 2020-09-09 ENCOUNTER — Inpatient Hospital Stay (HOSPITAL_BASED_OUTPATIENT_CLINIC_OR_DEPARTMENT_OTHER): Payer: Medicare Other | Admitting: Family

## 2020-09-09 ENCOUNTER — Other Ambulatory Visit: Payer: Self-pay

## 2020-09-09 ENCOUNTER — Encounter: Payer: Self-pay | Admitting: Family

## 2020-09-09 VITALS — BP 132/82 | HR 55 | Temp 98.5°F | Resp 17

## 2020-09-09 DIAGNOSIS — C9001 Multiple myeloma in remission: Secondary | ICD-10-CM | POA: Diagnosis not present

## 2020-09-09 DIAGNOSIS — Z5112 Encounter for antineoplastic immunotherapy: Secondary | ICD-10-CM | POA: Insufficient documentation

## 2020-09-09 DIAGNOSIS — C9000 Multiple myeloma not having achieved remission: Secondary | ICD-10-CM

## 2020-09-09 LAB — CBC WITH DIFFERENTIAL (CANCER CENTER ONLY)
Abs Immature Granulocytes: 0 10*3/uL (ref 0.00–0.07)
Basophils Absolute: 0 10*3/uL (ref 0.0–0.1)
Basophils Relative: 1 %
Eosinophils Absolute: 0.1 10*3/uL (ref 0.0–0.5)
Eosinophils Relative: 3 %
HCT: 34.7 % — ABNORMAL LOW (ref 39.0–52.0)
Hemoglobin: 12.6 g/dL — ABNORMAL LOW (ref 13.0–17.0)
Immature Granulocytes: 0 %
Lymphocytes Relative: 36 %
Lymphs Abs: 0.7 10*3/uL (ref 0.7–4.0)
MCH: 36.1 pg — ABNORMAL HIGH (ref 26.0–34.0)
MCHC: 36.3 g/dL — ABNORMAL HIGH (ref 30.0–36.0)
MCV: 99.4 fL (ref 80.0–100.0)
Monocytes Absolute: 0.2 10*3/uL (ref 0.1–1.0)
Monocytes Relative: 12 %
Neutro Abs: 0.9 10*3/uL — ABNORMAL LOW (ref 1.7–7.7)
Neutrophils Relative %: 48 %
Platelet Count: 135 10*3/uL — ABNORMAL LOW (ref 150–400)
RBC: 3.49 MIL/uL — ABNORMAL LOW (ref 4.22–5.81)
RDW: 13.2 % (ref 11.5–15.5)
WBC Count: 1.9 10*3/uL — ABNORMAL LOW (ref 4.0–10.5)
nRBC: 0 % (ref 0.0–0.2)

## 2020-09-09 LAB — COMPREHENSIVE METABOLIC PANEL
ALT: 45 U/L — ABNORMAL HIGH (ref 0–44)
AST: 34 U/L (ref 15–41)
Albumin: 4.4 g/dL (ref 3.5–5.0)
Alkaline Phosphatase: 51 U/L (ref 38–126)
Anion gap: 8 (ref 5–15)
BUN: 18 mg/dL (ref 6–20)
CO2: 25 mmol/L (ref 22–32)
Calcium: 9.2 mg/dL (ref 8.9–10.3)
Chloride: 103 mmol/L (ref 98–111)
Creatinine, Ser: 1.44 mg/dL — ABNORMAL HIGH (ref 0.61–1.24)
GFR, Estimated: 57 mL/min — ABNORMAL LOW (ref >60)
Glucose, Bld: 80 mg/dL (ref 70–99)
Potassium: 4.1 mmol/L (ref 3.5–5.1)
Sodium: 136 mmol/L (ref 135–145)
Total Bilirubin: 0.8 mg/dL (ref 0.3–1.2)
Total Protein: 7 g/dL (ref 6.5–8.1)

## 2020-09-09 LAB — LACTATE DEHYDROGENASE: LDH: 158 U/L (ref 98–192)

## 2020-09-09 MED ORDER — BORTEZOMIB CHEMO SQ INJECTION 3.5 MG (2.5MG/ML)
3.0000 mg | Freq: Once | INTRAMUSCULAR | Status: AC
  Administered 2020-09-09: 3 mg via SUBCUTANEOUS
  Filled 2020-09-09: qty 1.2

## 2020-09-09 NOTE — Progress Notes (Signed)
Per dr ennever, okay to treat today despite labs. 

## 2020-09-09 NOTE — Progress Notes (Signed)
Hematology and Oncology Follow Up Visit  Tyler Phillips [id number] 1965/12/31 55 y.o. 09/09/2020   Principle Diagnosis:  IgG Kappa myeloma - +4, +14, +17  Past Therapy: Status post autologous stem cell transplant on 05/22/2015 S/pcycle 6 of RVD Ninlaro 4 mg po q 2 week -- start on 01/02/2019 -- d/c on 02/10/2019  Current Therapy: Velcade q 2wk dosing Revlimid 10mg  po q day (21/7)   Interim History:  [name] is here today for follow-up and treatment. He is doing well and states that he is doing [employer] and working outside.  He has some occasional fatigue.  No M-spike noted in April, IgG level 1,382 mg/dL and kappa light chains 3.63 mg/dL.  LFT's are much improved.  WBC count 1.9, ANC 0.9, Hgb 12.6, MCV 99 and platelets 135.  He got his second [employer] booster for Covid last week on Friday.  He has a little bit of a rash on his arms and one spot each on the forehead and left leg. He states that this happens when it gets hot out while on Revlimid. It is described as tolerable and resolving on its own.  No fever, chills, n/v, cough, dizziness, SOB, chest pain, palpitations, abdominal pain or changes in bowel or bladder habits at this time.  No swelling or tenderness in his extremities.  The neuropathy in his feet is stable/unchanged.  No falls or syncope to report.  He has a good appetite and is making an effort to stay well hydrated. His weight is stable at 211 lbs.   ECOG Performance Status: 1 - Symptomatic but completely ambulatory  Medications:  Allergies as of 09/09/2020   No Known Allergies     Medication List       Accurate as of Sep 09, 2020  9:22 AM. If you have any questions, ask your nurse or doctor.        STOP taking these medications   clindamycin 300 MG capsule Commonly known as: CLEOCIN Stopped by: Laverna Peace, NP     TAKE these medications   aspirin 325 MG tablet Take 325 mg by mouth daily.   bortezomib IV 3.5 MG  injection Commonly known as: VELCADE Inject into the vein every 14 (fourteen) days.   cetirizine 10 MG tablet Commonly known as: ZYRTEC Take 10 mg by mouth daily.   lenalidomide 10 MG capsule Commonly known as: Revlimid TAKE 1 CAPSULE BY MOUTH  DAILY FOR 21 DAYS ON, THEN  7 DAYS OFF Auth# [id number]   LORazepam 1 MG tablet Commonly known as: ATIVAN Take 1 tablet (1 mg total) by mouth every 4 (four) hours as needed.   Pfizer-BioNTech COVID-19 Vacc 30 MCG/0.3ML injection Generic drug: COVID-19 mRNA vaccine (Pfizer)   valACYclovir 500 MG tablet Commonly known as: VALTREX Take 500 mg by mouth 2 (two) times daily.   zolpidem 10 MG tablet Commonly known as: AMBIEN Take 1 tablet (10 mg total) by mouth at bedtime as needed for sleep.       Allergies: No Known Allergies  Past Medical History, Surgical history, Social history, and Family History were reviewed and updated.  Review of Systems: All other 10 point review of systems is negative.   Physical Exam:  vitals were not taken for this visit.   Wt Readings from Last 3 Encounters:  07/18/20 216 lb (98 kg)  06/06/20 215 lb (97.5 kg)  05/09/20 214 lb (97.1 kg)    Ocular: Sclerae unicteric, pupils equal, round and reactive to light Ear-nose-throat: Oropharynx clear,  dentition fair Lymphatic: No cervical or supraclavicular adenopathy Lungs no rales or rhonchi, good excursion bilaterally Heart regular rate and rhythm, no murmur appreciated Abd soft, nontender, positive bowel sounds MSK no focal spinal tenderness, no joint edema Neuro: non-focal, well-oriented, appropriate affect Breasts: Deferred   Lab Results  Component Value Date   WBC 1.9 (L) 09/09/2020   HGB 12.6 (L) 09/09/2020   HCT 34.7 (L) 09/09/2020   MCV 99.4 09/09/2020   PLT 135 (L) 09/09/2020   Lab Results  Component Value Date   FERRITIN 1,263 (H) 07/20/2014   IRON 125 07/20/2014   TIBC 198 (L) 07/20/2014   UIBC 73 (L) 07/20/2014   IRONPCTSAT 63 (H)  07/20/2014   Lab Results  Component Value Date   RETICCTPCT 0.8 07/20/2014   RBC 3.49 (L) 09/09/2020   Lab Results  Component Value Date   KPAFRELGTCHN 36.3 (H) 07/18/2020   LAMBDASER 26.6 (H) 07/18/2020   KAPLAMBRATIO 1.36 07/18/2020   Lab Results  Component Value Date   IGGSERUM 1,382 07/18/2020   IGA 232 07/18/2020   IGMSERUM 20 07/18/2020   Lab Results  Component Value Date   TOTALPROTELP 7.1 07/18/2020   ALBUMINELP 4.1 07/18/2020   A1GS 0.2 07/18/2020   A2GS 0.5 07/18/2020   BETS 1.0 07/18/2020   BETA2SER 0.3 03/28/2015   GAMS 1.3 07/18/2020   MSPIKE Not Observed 07/18/2020   SPEI Comment 01/25/2020     Chemistry      Component Value Date/Time   NA 136 08/15/2020 1158   NA 140 04/22/2017 1140   NA 138 09/03/2015 1042   K 4.1 08/15/2020 1158   K 4.0 04/22/2017 1140   K 4.3 09/03/2015 1042   CL 104 08/15/2020 1158   CL 105 04/22/2017 1140   CO2 23 08/15/2020 1158   CO2 24 04/22/2017 1140   CO2 25 09/03/2015 1042   BUN 18 08/15/2020 1158   BUN 16 04/22/2017 1140   BUN 21.7 09/03/2015 1042   CREATININE 1.43 (H) 08/15/2020 1158   CREATININE 1.6 (H) 04/22/2017 1140   CREATININE 1.2 09/03/2015 1042      Component Value Date/Time   CALCIUM 9.3 08/15/2020 1158   CALCIUM 8.5 04/22/2017 1140   CALCIUM 9.1 09/03/2015 1042   ALKPHOS 54 08/15/2020 1158   ALKPHOS 59 04/22/2017 1140   ALKPHOS 42 09/03/2015 1042   AST 33 08/15/2020 1158   AST 27 09/03/2015 1042   ALT 56 (H) 08/15/2020 1158   ALT 45 04/22/2017 1140   ALT 41 09/03/2015 1042   BILITOT 0.9 08/15/2020 1158   BILITOT 0.54 09/03/2015 1042       Impression and Plan: [name] is a very pleasant 55 yo caucasian gentleman withIgG kappa myeloma. He underwent induction chemotherapy with RVD followed by an autologous stem cell transplant at [facility] on February 2017. Lab work reviewed with Dr. [name].  Patient received Velcade today as planned. No changes to Revlimid dosing/regimen per MD.  Follow-up in  6 weeks.  He can contact our office with any questions or concerns.   Laverna Peace, NP 5/24/20229:22 AM

## 2020-09-09 NOTE — Patient Instructions (Signed)
[facility]  Discharge Instructions: Thank you for choosing [facility] to provide your oncology and hematology care.   If you have a lab appointment with the [facility], please go directly to the [facility] and check in at the registration area.  Wear comfortable clothing and clothing appropriate for easy access to any Portacath or PICC line.   We strive to give you quality time with your provider. You may need to reschedule your appointment if you arrive late (15 or more minutes).  Arriving late affects you and other patients whose appointments are after yours.  Also, if you miss three or more appointments without notifying the office, you may be dismissed from the clinic at the provider's discretion.      For prescription refill requests, have your pharmacy contact our office and allow 72 hours for refills to be completed.    Today you received the following chemotherapy and/or immunotherapy agents Velcade.   To help prevent nausea and vomiting after your treatment, we encourage you to take your nausea medication as directed.  BELOW ARE SYMPTOMS THAT SHOULD BE REPORTED IMMEDIATELY: . *FEVER GREATER THAN 100.4 F (38 C) OR HIGHER . *CHILLS OR SWEATING . *NAUSEA AND VOMITING THAT IS NOT CONTROLLED WITH YOUR NAUSEA MEDICATION . *UNUSUAL SHORTNESS OF BREATH . *UNUSUAL BRUISING OR BLEEDING . *URINARY PROBLEMS (pain or burning when urinating, or frequent urination) . *BOWEL PROBLEMS (unusual diarrhea, constipation, pain near the anus) . TENDERNESS IN MOUTH AND THROAT WITH OR WITHOUT PRESENCE OF ULCERS (sore throat, sores in mouth, or a toothache) . UNUSUAL RASH, SWELLING OR PAIN  . UNUSUAL VAGINAL DISCHARGE OR ITCHING   Items with * indicate a potential emergency and should be followed up as soon as possible or go to the Emergency Department if any problems should occur.  Please show the CHEMOTHERAPY ALERT CARD or IMMUNOTHERAPY ALERT CARD at  check-in to the Emergency Department and triage nurse. Should you have questions after your visit or need to cancel or reschedule your appointment, please contact [facility]  [phone] and follow the prompts.  Office hours are 8:00 a.m. to 4:30 p.m. Monday - Friday. Please note that voicemails left after 4:00 p.m. may not be returned until the following business day.  We are closed weekends and major holidays. You have access to a nurse at all times for urgent questions. Please call the main number to the clinic [phone] and follow the prompts.  For any non-urgent questions, you may also contact your provider using MyChart. We now offer e-Visits for anyone 8 and older to request care online for non-urgent symptoms. For details visit [email or web address].   Also download the MyChart app! Go to the app store, search "MyChart", open the app, select [facility], and log in with your MyChart username and password.  Due to Covid, a mask is required upon entering the hospital/clinic. If you do not have a mask, one will be given to you upon arrival. For doctor visits, patients may have 1 support person aged 30 or older with them. For treatment visits, patients cannot have anyone with them due to current Covid guidelines and our immunocompromised population.

## 2020-09-10 ENCOUNTER — Telehealth: Payer: Self-pay | Admitting: *Deleted

## 2020-09-10 LAB — IGG, IGA, IGM
IgA: 221 mg/dL (ref 90–386)
IgG (Immunoglobin G), Serum: 1343 mg/dL (ref 603–1613)
IgM (Immunoglobulin M), Srm: 25 mg/dL (ref 20–172)

## 2020-09-10 LAB — KAPPA/LAMBDA LIGHT CHAINS
Kappa free light chain: 37.9 mg/L — ABNORMAL HIGH (ref 3.3–19.4)
Kappa, lambda light chain ratio: 1.39 (ref 0.26–1.65)
Lambda free light chains: 27.3 mg/L — ABNORMAL HIGH (ref 5.7–26.3)

## 2020-09-10 NOTE — Telephone Encounter (Signed)
Per 09/09/20 los - called and gave upcoming appointments - mailed calendar

## 2020-09-11 LAB — PROTEIN ELECTROPHORESIS, SERUM, WITH REFLEX
A/G Ratio: 1.6 (ref 0.7–1.7)
Albumin ELP: 4.2 g/dL (ref 2.9–4.4)
Alpha-1-Globulin: 0.1 g/dL (ref 0.0–0.4)
Alpha-2-Globulin: 0.5 g/dL (ref 0.4–1.0)
Beta Globulin: 0.9 g/dL (ref 0.7–1.3)
Gamma Globulin: 1.2 g/dL (ref 0.4–1.8)
Globulin, Total: 2.7 g/dL (ref 2.2–3.9)
Total Protein ELP: 6.9 g/dL (ref 6.0–8.5)

## 2020-09-19 ENCOUNTER — Other Ambulatory Visit: Payer: Self-pay | Admitting: *Deleted

## 2020-09-19 DIAGNOSIS — C9002 Multiple myeloma in relapse: Secondary | ICD-10-CM

## 2020-09-19 MED ORDER — LENALIDOMIDE 10 MG PO CAPS: ORAL_CAPSULE | ORAL | 0 refills | Status: DC

## 2020-09-23 ENCOUNTER — Other Ambulatory Visit: Payer: Self-pay

## 2020-09-23 ENCOUNTER — Inpatient Hospital Stay: Payer: Medicare Other | Attending: Hematology & Oncology

## 2020-09-23 ENCOUNTER — Inpatient Hospital Stay: Payer: Medicare Other

## 2020-09-23 VITALS — BP 128/83 | HR 54 | Temp 98.6°F | Resp 18

## 2020-09-23 DIAGNOSIS — C9001 Multiple myeloma in remission: Secondary | ICD-10-CM

## 2020-09-23 DIAGNOSIS — Z79899 Other long term (current) drug therapy: Secondary | ICD-10-CM | POA: Diagnosis not present

## 2020-09-23 DIAGNOSIS — C9000 Multiple myeloma not having achieved remission: Secondary | ICD-10-CM

## 2020-09-23 DIAGNOSIS — Z5112 Encounter for antineoplastic immunotherapy: Secondary | ICD-10-CM | POA: Diagnosis not present

## 2020-09-23 LAB — CMP (CANCER CENTER ONLY)
ALT: 49 U/L — ABNORMAL HIGH (ref 0–44)
AST: 31 U/L (ref 15–41)
Albumin: 4.4 g/dL (ref 3.5–5.0)
Alkaline Phosphatase: 52 U/L (ref 38–126)
Anion gap: 5 (ref 5–15)
BUN: 19 mg/dL (ref 6–20)
CO2: 26 mmol/L (ref 22–32)
Calcium: 8.9 mg/dL (ref 8.9–10.3)
Chloride: 103 mmol/L (ref 98–111)
Creatinine: 1.5 mg/dL — ABNORMAL HIGH (ref 0.61–1.24)
GFR, Estimated: 55 mL/min — ABNORMAL LOW (ref >60)
Glucose, Bld: 73 mg/dL (ref 70–99)
Potassium: 4.6 mmol/L (ref 3.5–5.1)
Sodium: 134 mmol/L — ABNORMAL LOW (ref 135–145)
Total Bilirubin: 0.7 mg/dL (ref 0.3–1.2)
Total Protein: 7 g/dL (ref 6.5–8.1)

## 2020-09-23 LAB — CBC WITH DIFFERENTIAL (CANCER CENTER ONLY)
Abs Immature Granulocytes: 0.01 10*3/uL (ref 0.00–0.07)
Basophils Absolute: 0 10*3/uL (ref 0.0–0.1)
Basophils Relative: 0 %
Eosinophils Absolute: 0.1 10*3/uL (ref 0.0–0.5)
Eosinophils Relative: 3 %
HCT: 35.8 % — ABNORMAL LOW (ref 39.0–52.0)
Hemoglobin: 12.8 g/dL — ABNORMAL LOW (ref 13.0–17.0)
Immature Granulocytes: 0 %
Lymphocytes Relative: 30 %
Lymphs Abs: 0.8 10*3/uL (ref 0.7–4.0)
MCH: 36.5 pg — ABNORMAL HIGH (ref 26.0–34.0)
MCHC: 35.8 g/dL (ref 30.0–36.0)
MCV: 102 fL — ABNORMAL HIGH (ref 80.0–100.0)
Monocytes Absolute: 0.2 10*3/uL (ref 0.1–1.0)
Monocytes Relative: 9 %
Neutro Abs: 1.5 10*3/uL — ABNORMAL LOW (ref 1.7–7.7)
Neutrophils Relative %: 58 %
Platelet Count: 126 10*3/uL — ABNORMAL LOW (ref 150–400)
RBC: 3.51 MIL/uL — ABNORMAL LOW (ref 4.22–5.81)
RDW: 13 % (ref 11.5–15.5)
WBC Count: 2.7 10*3/uL — ABNORMAL LOW (ref 4.0–10.5)
nRBC: 0 % (ref 0.0–0.2)

## 2020-09-23 LAB — LACTATE DEHYDROGENASE: LDH: 161 U/L (ref 98–192)

## 2020-09-23 MED ORDER — BORTEZOMIB CHEMO SQ INJECTION 3.5 MG (2.5MG/ML)
3.0000 mg | Freq: Once | INTRAMUSCULAR | Status: AC
  Administered 2020-09-23: 3 mg via SUBCUTANEOUS
  Filled 2020-09-23: qty 1.2

## 2020-09-23 NOTE — Progress Notes (Signed)
Ok to treat with Creatinine of 1.5 per Dr [name]

## 2020-09-23 NOTE — Patient Instructions (Signed)
Bortezomib injection What is this medicine? BORTEZOMIB (bor TEZ oh mib) targets proteins in cancer cells and stops the cancer cells from growing. It treats multiple myeloma and mantle cell lymphoma. This medicine may be used for other purposes; ask your health care provider or pharmacist if you have questions. COMMON BRAND NAME(S): Velcade What should I tell my health care provider before I take this medicine? They need to know if you have any of these conditions:  dehydration  diabetes (high blood sugar)  heart disease  liver disease  tingling of the fingers or toes or other nerve disorder  an unusual or allergic reaction to bortezomib, mannitol, boron, other medicines, foods, dyes, or preservatives  pregnant or trying to get pregnant  breast-feeding How should I use this medicine? This medicine is injected into a vein or under the skin. It is given by a health care provider in a hospital or clinic setting. Talk to your health care provider about the use of this medicine in children. Special care may be needed. Overdosage: If you think you have taken too much of this medicine contact a poison control center or emergency room at once. NOTE: This medicine is only for you. Do not share this medicine with others. What if I miss a dose? Keep appointments for follow-up doses. It is important not to miss your dose. Call your health care provider if you are unable to keep an appointment. What may interact with this medicine? This medicine may interact with the following medications:  ketoconazole  rifampin This list may not describe all possible interactions. Give your health care provider a list of all the medicines, herbs, non-prescription drugs, or dietary supplements you use. Also tell them if you smoke, drink alcohol, or use illegal drugs. Some items may interact with your medicine. What should I watch for while using this medicine? Your condition will be monitored carefully while  you are receiving this medicine. You may need blood work done while you are taking this medicine. You may get drowsy or dizzy. Do not drive, use machinery, or do anything that needs mental alertness until you know how this medicine affects you. Do not stand up or sit up quickly, especially if you are an older patient. This reduces the risk of dizzy or fainting spells This medicine may increase your risk of getting an infection. Call your health care provider for advice if you get a fever, chills, sore throat, or other symptoms of a cold or flu. Do not treat yourself. Try to avoid being around people who are sick. Check with your health care provider if you have severe diarrhea, nausea, and vomiting, or if you sweat a lot. The loss of too much body fluid may make it dangerous for you to take this medicine. Do not become pregnant while taking this medicine or for 7 months after stopping it. Women should inform their health care provider if they wish to become pregnant or think they might be pregnant. Men should not father a child while taking this medicine and for 4 months after stopping it. There is a potential for serious harm to an unborn child. Talk to your health care provider for more information. Do not breast-feed an infant while taking this medicine or for 2 months after stopping it. This medicine may make it more difficult to get pregnant or father a child. Talk to your health care provider if you are concerned about your fertility. What side effects may I notice from receiving this medicine?   Side effects that you should report to your doctor or health care professional as soon as possible:  allergic reactions (skin rash; itching or hives; swelling of the face, lips, or tongue)  bleeding (bloody or black, tarry stools; red or dark brown urine; spitting up blood or brown material that looks like coffee grounds; red spots on the skin; unusual bruising or bleeding from the eye, gums, or  nose)  blurred vision or changes in vision  confusion  constipation  headache  heart failure (trouble breathing; fast, irregular heartbeat; sudden weight gain; swelling of the ankles, feet, hands)  infection (fever, chills, cough, sore throat, pain or trouble passing urine)  lack or loss of appetite  liver injury (dark yellow or brown urine; general ill feeling or flu-like symptoms; loss of appetite, right upper belly pain; yellowing of the eyes or skin)  low blood pressure (dizziness; feeling faint or lightheaded, falls; unusually weak or tired)  muscle cramps  pain, redness, or irritation at site where injected  pain, tingling, numbness in the hands or feet  seizures  trouble breathing  unusual bruising or bleeding Side effects that usually do not require medical attention (report to your doctor or health care professional if they continue or are bothersome):  diarrhea  nausea  stomach pain  trouble sleeping  vomiting This list may not describe all possible side effects. Call your doctor for medical advice about side effects. You may report side effects to FDA at 1-800-FDA-1088. Where should I keep my medicine? This medicine is given in a hospital or clinic. It will not be stored at home. NOTE: This sheet is a summary. It may not cover all possible information. If you have questions about this medicine, talk to your doctor, pharmacist, or health care provider.  2021 [employer]/Gold Standard (2020-03-27 13:22:53)

## 2020-09-24 LAB — KAPPA/LAMBDA LIGHT CHAINS
Kappa free light chain: 42.4 mg/L — ABNORMAL HIGH (ref 3.3–19.4)
Kappa, lambda light chain ratio: 1.37 (ref 0.26–1.65)
Lambda free light chains: 31 mg/L — ABNORMAL HIGH (ref 5.7–26.3)

## 2020-09-24 LAB — PROTEIN ELECTROPHORESIS, SERUM
A/G Ratio: 1.5 (ref 0.7–1.7)
Albumin ELP: 4.3 g/dL (ref 2.9–4.4)
Alpha-1-Globulin: 0.1 g/dL (ref 0.0–0.4)
Alpha-2-Globulin: 0.6 g/dL (ref 0.4–1.0)
Beta Globulin: 0.9 g/dL (ref 0.7–1.3)
Gamma Globulin: 1.3 g/dL (ref 0.4–1.8)
Globulin, Total: 2.9 g/dL (ref 2.2–3.9)
Total Protein ELP: 7.2 g/dL (ref 6.0–8.5)

## 2020-09-24 LAB — IGG, IGA, IGM
IgA: 230 mg/dL (ref 90–386)
IgG (Immunoglobin G), Serum: 1268 mg/dL (ref 603–1613)
IgM (Immunoglobulin M), Srm: 25 mg/dL (ref 20–172)

## 2020-10-02 ENCOUNTER — Telehealth: Payer: Self-pay

## 2020-10-07 ENCOUNTER — Other Ambulatory Visit: Payer: Medicare Other

## 2020-10-07 ENCOUNTER — Inpatient Hospital Stay: Payer: Medicare Other

## 2020-10-10 ENCOUNTER — Other Ambulatory Visit: Payer: Self-pay | Admitting: *Deleted

## 2020-10-10 DIAGNOSIS — C9001 Multiple myeloma in remission: Secondary | ICD-10-CM

## 2020-10-13 ENCOUNTER — Inpatient Hospital Stay: Payer: Medicare Other

## 2020-10-13 ENCOUNTER — Other Ambulatory Visit: Payer: Self-pay

## 2020-10-13 VITALS — BP 134/79 | HR 76 | Temp 98.1°F | Resp 17

## 2020-10-13 DIAGNOSIS — C9001 Multiple myeloma in remission: Secondary | ICD-10-CM

## 2020-10-13 DIAGNOSIS — Z5112 Encounter for antineoplastic immunotherapy: Secondary | ICD-10-CM | POA: Diagnosis not present

## 2020-10-13 DIAGNOSIS — Z79899 Other long term (current) drug therapy: Secondary | ICD-10-CM | POA: Diagnosis not present

## 2020-10-13 LAB — CBC WITH DIFFERENTIAL (CANCER CENTER ONLY)
Abs Immature Granulocytes: 0.01 10*3/uL (ref 0.00–0.07)
Basophils Absolute: 0 10*3/uL (ref 0.0–0.1)
Basophils Relative: 1 %
Eosinophils Absolute: 0 10*3/uL (ref 0.0–0.5)
Eosinophils Relative: 1 %
HCT: 35.5 % — ABNORMAL LOW (ref 39.0–52.0)
Hemoglobin: 13 g/dL (ref 13.0–17.0)
Immature Granulocytes: 0 %
Lymphocytes Relative: 25 %
Lymphs Abs: 0.9 10*3/uL (ref 0.7–4.0)
MCH: 36.8 pg — ABNORMAL HIGH (ref 26.0–34.0)
MCHC: 36.6 g/dL — ABNORMAL HIGH (ref 30.0–36.0)
MCV: 100.6 fL — ABNORMAL HIGH (ref 80.0–100.0)
Monocytes Absolute: 0.2 10*3/uL (ref 0.1–1.0)
Monocytes Relative: 7 %
Neutro Abs: 2.3 10*3/uL (ref 1.7–7.7)
Neutrophils Relative %: 66 %
Platelet Count: 141 10*3/uL — ABNORMAL LOW (ref 150–400)
RBC: 3.53 MIL/uL — ABNORMAL LOW (ref 4.22–5.81)
RDW: 12.7 % (ref 11.5–15.5)
WBC Count: 3.5 10*3/uL — ABNORMAL LOW (ref 4.0–10.5)
nRBC: 0 % (ref 0.0–0.2)

## 2020-10-13 LAB — CMP (CANCER CENTER ONLY)
ALT: 46 U/L — ABNORMAL HIGH (ref 0–44)
AST: 25 U/L (ref 15–41)
Albumin: 4.4 g/dL (ref 3.5–5.0)
Alkaline Phosphatase: 57 U/L (ref 38–126)
Anion gap: 8 (ref 5–15)
BUN: 19 mg/dL (ref 6–20)
CO2: 23 mmol/L (ref 22–32)
Calcium: 9.2 mg/dL (ref 8.9–10.3)
Chloride: 107 mmol/L (ref 98–111)
Creatinine: 1.5 mg/dL — ABNORMAL HIGH (ref 0.61–1.24)
GFR, Estimated: 55 mL/min — ABNORMAL LOW (ref >60)
Glucose, Bld: 90 mg/dL (ref 70–99)
Potassium: 3.8 mmol/L (ref 3.5–5.1)
Sodium: 138 mmol/L (ref 135–145)
Total Bilirubin: 0.9 mg/dL (ref 0.3–1.2)
Total Protein: 7.2 g/dL (ref 6.5–8.1)

## 2020-10-13 MED ORDER — BORTEZOMIB CHEMO SQ INJECTION 3.5 MG (2.5MG/ML)
3.0000 mg | Freq: Once | INTRAMUSCULAR | Status: AC
Start: 2020-10-13 — End: 2020-10-13
  Administered 2020-10-13: 3 mg via SUBCUTANEOUS
  Filled 2020-10-13: qty 1.2

## 2020-10-13 NOTE — Patient Instructions (Signed)
[facility] Discharge Instructions for Patients Receiving Chemotherapy  Today you received the following chemotherapy agents Velcade To help prevent nausea and vomiting after your treatment, we encourage you to take your nausea medication as prescribed.   If you develop nausea and vomiting that is not controlled by your nausea medication, call the clinic.   BELOW ARE SYMPTOMS THAT SHOULD BE REPORTED IMMEDIATELY:  *FEVER GREATER THAN 100.5 F  *CHILLS WITH OR WITHOUT FEVER  NAUSEA AND VOMITING THAT IS NOT CONTROLLED WITH YOUR NAUSEA MEDICATION  *UNUSUAL SHORTNESS OF BREATH  *UNUSUAL BRUISING OR BLEEDING  TENDERNESS IN MOUTH AND THROAT WITH OR WITHOUT PRESENCE OF ULCERS  *URINARY PROBLEMS  *BOWEL PROBLEMS  UNUSUAL RASH Items with * indicate a potential emergency and should be followed up as soon as possible.  Feel free to call the clinic should you have any questions or concerns. The clinic phone number is [phone].  Please show the CHEMO ALERT CARD at check-in to the Emergency Department and triage nurse.   

## 2020-10-19 ENCOUNTER — Other Ambulatory Visit: Payer: Self-pay | Admitting: Hematology & Oncology

## 2020-10-19 DIAGNOSIS — C9002 Multiple myeloma in relapse: Secondary | ICD-10-CM

## 2020-10-21 ENCOUNTER — Inpatient Hospital Stay: Payer: Medicare Other

## 2020-10-21 ENCOUNTER — Other Ambulatory Visit: Payer: Medicare Other

## 2020-10-21 ENCOUNTER — Ambulatory Visit: Payer: Medicare Other | Admitting: Hematology & Oncology

## 2020-10-21 ENCOUNTER — Inpatient Hospital Stay: Payer: Medicare Other | Admitting: Family

## 2020-10-22 ENCOUNTER — Other Ambulatory Visit: Payer: Self-pay

## 2020-10-22 DIAGNOSIS — C9002 Multiple myeloma in relapse: Secondary | ICD-10-CM

## 2020-10-22 MED ORDER — LENALIDOMIDE 10 MG PO CAPS: ORAL_CAPSULE | ORAL | 0 refills | Status: DC

## 2020-10-24 ENCOUNTER — Inpatient Hospital Stay: Payer: Medicare Other

## 2020-10-24 ENCOUNTER — Other Ambulatory Visit: Payer: Medicare Other

## 2020-10-24 ENCOUNTER — Ambulatory Visit: Payer: Medicare Other | Admitting: Hematology & Oncology

## 2020-10-28 ENCOUNTER — Other Ambulatory Visit: Payer: Self-pay | Admitting: *Deleted

## 2020-10-28 DIAGNOSIS — C9001 Multiple myeloma in remission: Secondary | ICD-10-CM

## 2020-10-29 ENCOUNTER — Other Ambulatory Visit: Payer: Self-pay

## 2020-10-29 ENCOUNTER — Inpatient Hospital Stay (HOSPITAL_BASED_OUTPATIENT_CLINIC_OR_DEPARTMENT_OTHER): Payer: Medicare Other | Admitting: Family

## 2020-10-29 ENCOUNTER — Encounter: Payer: Self-pay | Admitting: Family

## 2020-10-29 ENCOUNTER — Inpatient Hospital Stay: Payer: Medicare Other | Attending: Hematology & Oncology

## 2020-10-29 ENCOUNTER — Inpatient Hospital Stay: Payer: Medicare Other

## 2020-10-29 ENCOUNTER — Telehealth: Payer: Self-pay

## 2020-10-29 VITALS — BP 125/79 | HR 53 | Temp 97.9°F | Resp 17 | Wt 214.8 lb

## 2020-10-29 DIAGNOSIS — Z79899 Other long term (current) drug therapy: Secondary | ICD-10-CM | POA: Diagnosis not present

## 2020-10-29 DIAGNOSIS — C9000 Multiple myeloma not having achieved remission: Secondary | ICD-10-CM | POA: Diagnosis not present

## 2020-10-29 DIAGNOSIS — Z5112 Encounter for antineoplastic immunotherapy: Secondary | ICD-10-CM | POA: Insufficient documentation

## 2020-10-29 DIAGNOSIS — C9001 Multiple myeloma in remission: Secondary | ICD-10-CM

## 2020-10-29 LAB — COMPREHENSIVE METABOLIC PANEL
ALT: 61 U/L — ABNORMAL HIGH (ref 0–44)
AST: 36 U/L (ref 15–41)
Albumin: 4.3 g/dL (ref 3.5–5.0)
Alkaline Phosphatase: 59 U/L (ref 38–126)
Anion gap: 6 (ref 5–15)
BUN: 20 mg/dL (ref 6–20)
CO2: 27 mmol/L (ref 22–32)
Calcium: 9.1 mg/dL (ref 8.9–10.3)
Chloride: 104 mmol/L (ref 98–111)
Creatinine, Ser: 1.43 mg/dL — ABNORMAL HIGH (ref 0.61–1.24)
GFR, Estimated: 58 mL/min — ABNORMAL LOW (ref >60)
Glucose, Bld: 108 mg/dL — ABNORMAL HIGH (ref 70–99)
Potassium: 4.5 mmol/L (ref 3.5–5.1)
Sodium: 137 mmol/L (ref 135–145)
Total Bilirubin: 0.7 mg/dL (ref 0.3–1.2)
Total Protein: 6.8 g/dL (ref 6.5–8.1)

## 2020-10-29 LAB — LACTATE DEHYDROGENASE: LDH: 164 U/L (ref 98–192)

## 2020-10-29 LAB — CBC WITH DIFFERENTIAL (CANCER CENTER ONLY)
Abs Immature Granulocytes: 0 10*3/uL (ref 0.00–0.07)
Basophils Absolute: 0 10*3/uL (ref 0.0–0.1)
Basophils Relative: 0 %
Eosinophils Absolute: 0 10*3/uL (ref 0.0–0.5)
Eosinophils Relative: 1 %
HCT: 37.1 % — ABNORMAL LOW (ref 39.0–52.0)
Hemoglobin: 13.2 g/dL (ref 13.0–17.0)
Immature Granulocytes: 0 %
Lymphocytes Relative: 40 %
Lymphs Abs: 1 10*3/uL (ref 0.7–4.0)
MCH: 37.1 pg — ABNORMAL HIGH (ref 26.0–34.0)
MCHC: 35.6 g/dL (ref 30.0–36.0)
MCV: 104.2 fL — ABNORMAL HIGH (ref 80.0–100.0)
Monocytes Absolute: 0.2 10*3/uL (ref 0.1–1.0)
Monocytes Relative: 8 %
Neutro Abs: 1.2 10*3/uL — ABNORMAL LOW (ref 1.7–7.7)
Neutrophils Relative %: 51 %
Platelet Count: 133 10*3/uL — ABNORMAL LOW (ref 150–400)
RBC: 3.56 MIL/uL — ABNORMAL LOW (ref 4.22–5.81)
RDW: 12.8 % (ref 11.5–15.5)
WBC Count: 2.4 10*3/uL — ABNORMAL LOW (ref 4.0–10.5)
nRBC: 0 % (ref 0.0–0.2)

## 2020-10-29 MED ORDER — BORTEZOMIB CHEMO SQ INJECTION 3.5 MG (2.5MG/ML)
3.0000 mg | Freq: Once | INTRAMUSCULAR | Status: AC
  Administered 2020-10-29: 3 mg via SUBCUTANEOUS
  Filled 2020-10-29: qty 1.2

## 2020-10-29 NOTE — Progress Notes (Signed)
Hematology and Oncology Follow Up Visit  Tyler Phillips [id number] 01-12-1966 55 y.o. 10/29/2020   Principle Diagnosis:  IgG Kappa myeloma - +4, +14, +17   Past Therapy: Status post autologous stem cell transplant on 05/22/2015   S/p cycle 6 of RVD [place] 4 mg po q 2 week -- start on 01/02/2019 -- d/c on 02/10/2019   Current Therapy:        Velcade q 2wk dosing Revlimid 10mg  po q day (21/7)    Interim History:  [name] is here today for follow-up. He is doing well and enjoyed a trip to [place] and [place] with his family.  He has been doing [employer] work outs twice a week.  He has the same level of fatigue with exertion.  Last month, No M-spike detected, IgG level 1,268 mg/dL and kappa light chains 4.24 mg/dL.  No fever, chills, n/v, cough, rash, dizziness, SOB, chest pain, palpitations, abdominal pain or changes in bowel or bladder habits.  No swelling in his extremities.  The neuropathy in his lower extremities is unchanged.  No falls or syncope.  He has been eating well and staying hydrated. His weight is stable at 214 lbs.   ECOG Performance Status: 1 - Symptomatic but completely ambulatory  Medications:  Allergies as of 10/29/2020   No Known Allergies      Medication List        Accurate as of October 29, 2020 10:24 AM. If you have any questions, ask your nurse or doctor.          aspirin 325 MG tablet Take 325 mg by mouth daily.   bortezomib IV 3.5 MG injection Commonly known as: VELCADE Inject into the vein every 14 (fourteen) days.   cetirizine 10 MG tablet Commonly known as: ZYRTEC Take 10 mg by mouth daily.   lenalidomide 10 MG capsule Commonly known as: Revlimid TAKE 1 CAPSULE BY MOUTH  DAILY FOR 21 DAYS, THEN 7  DAYS OFF Auth # [id number]   LORazepam 1 MG tablet Commonly known as: ATIVAN Take 1 tablet (1 mg total) by mouth every 4 (four) hours as needed.   Pfizer-BioNTech COVID-19 Vacc 30 MCG/0.3ML injection Generic drug: COVID-19  mRNA vaccine (Pfizer)   valACYclovir 500 MG tablet Commonly known as: VALTREX Take 500 mg by mouth 2 (two) times daily.   zolpidem 10 MG tablet Commonly known as: AMBIEN Take 1 tablet (10 mg total) by mouth at bedtime as needed for sleep.        Allergies: No Known Allergies  Past Medical History, Surgical history, Social history, and Family History were reviewed and updated.  Review of Systems: All other 10 point review of systems is negative.   Physical Exam:  weight is 214 lb 12.8 oz (97.4 kg). His oral temperature is 97.9 F (36.6 C). His blood pressure is 125/79 and his pulse is 53 (abnormal). His respiration is 17 and oxygen saturation is 100%.   Wt Readings from Last 3 Encounters:  10/29/20 214 lb 12.8 oz (97.4 kg)  07/18/20 216 lb (98 kg)  06/06/20 215 lb (97.5 kg)    Ocular: Sclerae unicteric, pupils equal, round and reactive to light Ear-nose-throat: Oropharynx clear, dentition fair Lymphatic: No cervical or supraclavicular adenopathy Lungs no rales or rhonchi, good excursion bilaterally Heart regular rate and rhythm, no murmur appreciated Abd soft, nontender, positive bowel sounds MSK no focal spinal tenderness, no joint edema Neuro: non-focal, well-oriented, appropriate affect Breasts: Deferred   Lab Results  Component  Value Date   WBC 2.4 (L) 10/29/2020   HGB 13.2 10/29/2020   HCT 37.1 (L) 10/29/2020   MCV 104.2 (H) 10/29/2020   PLT 133 (L) 10/29/2020   Lab Results  Component Value Date   FERRITIN 1,263 (H) 07/20/2014   IRON 125 07/20/2014   TIBC 198 (L) 07/20/2014   UIBC 73 (L) 07/20/2014   IRONPCTSAT 63 (H) 07/20/2014   Lab Results  Component Value Date   RETICCTPCT 0.8 07/20/2014   RBC 3.56 (L) 10/29/2020   Lab Results  Component Value Date   KPAFRELGTCHN 42.4 (H) 09/23/2020   LAMBDASER 31.0 (H) 09/23/2020   KAPLAMBRATIO 1.37 09/23/2020   Lab Results  Component Value Date   IGGSERUM 1,268 09/23/2020   IGA 230 09/23/2020    IGMSERUM 25 09/23/2020   Lab Results  Component Value Date   TOTALPROTELP 7.2 09/23/2020   ALBUMINELP 4.3 09/23/2020   A1GS 0.1 09/23/2020   A2GS 0.6 09/23/2020   BETS 0.9 09/23/2020   BETA2SER 0.3 03/28/2015   GAMS 1.3 09/23/2020   MSPIKE Not Observed 09/23/2020   SPEI Comment 09/23/2020     Chemistry      Component Value Date/Time   NA 138 10/13/2020 1401   NA 140 04/22/2017 1140   NA 138 09/03/2015 1042   K 3.8 10/13/2020 1401   K 4.0 04/22/2017 1140   K 4.3 09/03/2015 1042   CL 107 10/13/2020 1401   CL 105 04/22/2017 1140   CO2 23 10/13/2020 1401   CO2 24 04/22/2017 1140   CO2 25 09/03/2015 1042   BUN 19 10/13/2020 1401   BUN 16 04/22/2017 1140   BUN 21.7 09/03/2015 1042   CREATININE 1.50 (H) 10/13/2020 1401   CREATININE 1.6 (H) 04/22/2017 1140   CREATININE 1.2 09/03/2015 1042      Component Value Date/Time   CALCIUM 9.2 10/13/2020 1401   CALCIUM 8.5 04/22/2017 1140   CALCIUM 9.1 09/03/2015 1042   ALKPHOS 57 10/13/2020 1401   ALKPHOS 59 04/22/2017 1140   ALKPHOS 42 09/03/2015 1042   AST 25 10/13/2020 1401   AST 27 09/03/2015 1042   ALT 46 (H) 10/13/2020 1401   ALT 45 04/22/2017 1140   ALT 41 09/03/2015 1042   BILITOT 0.9 10/13/2020 1401   BILITOT 0.54 09/03/2015 1042       Impression and Plan: [name] is a very pleasant 56 yo caucasian gentleman with IgG kappa myeloma. He underwent induction chemotherapy with RVD followed by an autologous stem cell transplant at [facility] on February 2017.  We continue to follow his protein studies closely.  He received Velcade today and will continue his same regimen with Revlimid. Lab and Velcade injection every 2 weeks with follow-up in 6 weeks.  He can contact our office with any questions or concerns.   Laverna Peace, NP 7/13/202210:24 AM

## 2020-10-29 NOTE — Patient Instructions (Signed)
[place]  Discharge Instructions: Thank you for choosing [facility] to provide your oncology and hematology care.   If you have a lab appointment with the [facility], please go directly to the [facility] and check in at the registration area.  Wear comfortable clothing and clothing appropriate for easy access to any Portacath or PICC line.   We strive to give you quality time with your provider. You may need to reschedule your appointment if you arrive late (15 or more minutes).  Arriving late affects you and other patients whose appointments are after yours.  Also, if you miss three or more appointments without notifying the office, you may be dismissed from the clinic at the provider's discretion.      For prescription refill requests, have your pharmacy contact our office and allow 72 hours for refills to be completed.    Today you received the following chemotherapy and/or immunotherapy agents Velcade       To help prevent nausea and vomiting after your treatment, we encourage you to take your nausea medication as directed.  BELOW ARE SYMPTOMS THAT SHOULD BE REPORTED IMMEDIATELY: *FEVER GREATER THAN 100.4 F (38 C) OR HIGHER *CHILLS OR SWEATING *NAUSEA AND VOMITING THAT IS NOT CONTROLLED WITH YOUR NAUSEA MEDICATION *UNUSUAL SHORTNESS OF BREATH *UNUSUAL BRUISING OR BLEEDING *URINARY PROBLEMS (pain or burning when urinating, or frequent urination) *BOWEL PROBLEMS (unusual diarrhea, constipation, pain near the anus) TENDERNESS IN MOUTH AND THROAT WITH OR WITHOUT PRESENCE OF ULCERS (sore throat, sores in mouth, or a toothache) UNUSUAL RASH, SWELLING OR PAIN  UNUSUAL VAGINAL DISCHARGE OR ITCHING   Items with * indicate a potential emergency and should be followed up as soon as possible or go to the Emergency Department if any problems should occur.  Please show the CHEMOTHERAPY ALERT CARD or IMMUNOTHERAPY ALERT CARD at check-in to the  Emergency Department and triage nurse. Should you have questions after your visit or need to cancel or reschedule your appointment, please contact [facility]  [phone] and follow the prompts.  Office hours are 8:00 a.m. to 4:30 p.m. Monday - Friday. Please note that voicemails left after 4:00 p.m. may not be returned until the following business day.  We are closed weekends and major holidays. You have access to a nurse at all times for urgent questions. Please call the main number to the clinic [phone] and follow the prompts.  For any non-urgent questions, you may also contact your provider using MyChart. We now offer e-Visits for anyone 46 and older to request care online for non-urgent symptoms. For details visit [email or web address].   Also download the MyChart app! Go to the app store, search "MyChart", open the app, select [facility], and log in with your MyChart username and password.  Due to Covid, a mask is required upon entering the hospital/clinic. If you do not have a mask, one will be given to you upon arrival. For doctor visits, patients may have 1 support person aged 44 or older with them. For treatment visits, patients cannot have anyone with them due to current Covid guidelines and our immunocompromised population.

## 2020-10-29 NOTE — Progress Notes (Signed)
Ok to give chemo today with ANC 1.2 per Dr [name]

## 2020-10-30 LAB — IGG, IGA, IGM
IgA: 226 mg/dL (ref 90–386)
IgG (Immunoglobin G), Serum: 1262 mg/dL (ref 603–1613)
IgM (Immunoglobulin M), Srm: 21 mg/dL (ref 20–172)

## 2020-10-30 LAB — KAPPA/LAMBDA LIGHT CHAINS
Kappa free light chain: 36 mg/L — ABNORMAL HIGH (ref 3.3–19.4)
Kappa, lambda light chain ratio: 1.29 (ref 0.26–1.65)
Lambda free light chains: 27.8 mg/L — ABNORMAL HIGH (ref 5.7–26.3)

## 2020-10-31 LAB — PROTEIN ELECTROPHORESIS, SERUM, WITH REFLEX
A/G Ratio: 1.4 (ref 0.7–1.7)
Albumin ELP: 4 g/dL (ref 2.9–4.4)
Alpha-1-Globulin: 0.1 g/dL (ref 0.0–0.4)
Alpha-2-Globulin: 0.5 g/dL (ref 0.4–1.0)
Beta Globulin: 0.9 g/dL (ref 0.7–1.3)
Gamma Globulin: 1.2 g/dL (ref 0.4–1.8)
Globulin, Total: 2.8 g/dL (ref 2.2–3.9)
Total Protein ELP: 6.8 g/dL (ref 6.0–8.5)

## 2020-11-11 ENCOUNTER — Other Ambulatory Visit: Payer: Self-pay | Admitting: *Deleted

## 2020-11-11 DIAGNOSIS — C9002 Multiple myeloma in relapse: Secondary | ICD-10-CM

## 2020-11-11 MED ORDER — LENALIDOMIDE 10 MG PO CAPS: ORAL_CAPSULE | ORAL | 0 refills | Status: DC

## 2020-11-12 ENCOUNTER — Other Ambulatory Visit: Payer: Self-pay

## 2020-11-12 ENCOUNTER — Inpatient Hospital Stay: Payer: Medicare Other

## 2020-11-12 VITALS — BP 116/85 | HR 57 | Temp 98.0°F | Resp 16

## 2020-11-12 DIAGNOSIS — Z79899 Other long term (current) drug therapy: Secondary | ICD-10-CM | POA: Diagnosis not present

## 2020-11-12 DIAGNOSIS — C9000 Multiple myeloma not having achieved remission: Secondary | ICD-10-CM

## 2020-11-12 DIAGNOSIS — Z5112 Encounter for antineoplastic immunotherapy: Secondary | ICD-10-CM | POA: Diagnosis not present

## 2020-11-12 DIAGNOSIS — C9001 Multiple myeloma in remission: Secondary | ICD-10-CM

## 2020-11-12 LAB — CBC WITH DIFFERENTIAL (CANCER CENTER ONLY)
Abs Immature Granulocytes: 0.01 10*3/uL (ref 0.00–0.07)
Basophils Absolute: 0 10*3/uL (ref 0.0–0.1)
Basophils Relative: 1 %
Eosinophils Absolute: 0.1 10*3/uL (ref 0.0–0.5)
Eosinophils Relative: 2 %
HCT: 38.1 % — ABNORMAL LOW (ref 39.0–52.0)
Hemoglobin: 13.6 g/dL (ref 13.0–17.0)
Immature Granulocytes: 0 %
Lymphocytes Relative: 34 %
Lymphs Abs: 1.1 10*3/uL (ref 0.7–4.0)
MCH: 36.4 pg — ABNORMAL HIGH (ref 26.0–34.0)
MCHC: 35.7 g/dL (ref 30.0–36.0)
MCV: 101.9 fL — ABNORMAL HIGH (ref 80.0–100.0)
Monocytes Absolute: 0.4 10*3/uL (ref 0.1–1.0)
Monocytes Relative: 11 %
Neutro Abs: 1.7 10*3/uL (ref 1.7–7.7)
Neutrophils Relative %: 52 %
Platelet Count: 128 10*3/uL — ABNORMAL LOW (ref 150–400)
RBC: 3.74 MIL/uL — ABNORMAL LOW (ref 4.22–5.81)
RDW: 12.8 % (ref 11.5–15.5)
WBC Count: 3.2 10*3/uL — ABNORMAL LOW (ref 4.0–10.5)
nRBC: 0 % (ref 0.0–0.2)

## 2020-11-12 LAB — CMP (CANCER CENTER ONLY)
ALT: 60 U/L — ABNORMAL HIGH (ref 0–44)
AST: 32 U/L (ref 15–41)
Albumin: 4.2 g/dL (ref 3.5–5.0)
Alkaline Phosphatase: 54 U/L (ref 38–126)
Anion gap: 8 (ref 5–15)
BUN: 12 mg/dL (ref 6–20)
CO2: 26 mmol/L (ref 22–32)
Calcium: 9.7 mg/dL (ref 8.9–10.3)
Chloride: 104 mmol/L (ref 98–111)
Creatinine: 1.36 mg/dL — ABNORMAL HIGH (ref 0.61–1.24)
GFR, Estimated: >60 mL/min (ref >60)
Glucose, Bld: 105 mg/dL — ABNORMAL HIGH (ref 70–99)
Potassium: 3.8 mmol/L (ref 3.5–5.1)
Sodium: 138 mmol/L (ref 135–145)
Total Bilirubin: 0.8 mg/dL (ref 0.3–1.2)
Total Protein: 7 g/dL (ref 6.5–8.1)

## 2020-11-12 LAB — LACTATE DEHYDROGENASE: LDH: 141 U/L (ref 98–192)

## 2020-11-12 MED ORDER — BORTEZOMIB CHEMO SQ INJECTION 3.5 MG (2.5MG/ML)
3.0000 mg | Freq: Once | INTRAMUSCULAR | Status: AC
  Administered 2020-11-12: 3 mg via SUBCUTANEOUS
  Filled 2020-11-12: qty 1.2

## 2020-11-12 NOTE — Patient Instructions (Signed)
Bortezomib injection What is this medication? BORTEZOMIB (bor TEZ oh mib) targets proteins in cancer cells and stops thecancer cells from growing. It treats multiple myeloma and mantle cell lymphoma. This medicine may be used for other purposes; ask your health care provider orpharmacist if you have questions. COMMON BRAND NAME(S): Velcade What should I tell my care team before I take this medication? They need to know if you have any of these conditions: dehydration diabetes (high blood sugar) heart disease liver disease tingling of the fingers or toes or other nerve disorder an unusual or allergic reaction to bortezomib, mannitol, boron, other medicines, foods, dyes, or preservatives pregnant or trying to get pregnant breast-feeding How should I use this medication? This medicine is injected into a vein or under the skin. It is given by ahealth care provider in a hospital or clinic setting. Talk to your health care provider about the use of this medicine in children.Special care may be needed. Overdosage: If you think you have taken too much of this medicine contact apoison control center or emergency room at once. NOTE: This medicine is only for you. Do not share this medicine with others. What if I miss a dose? Keep appointments for follow-up doses. It is important not to miss your dose.Call your health care provider if you are unable to keep an appointment. What may interact with this medication? This medicine may interact with the following medications: ketoconazole rifampin This list may not describe all possible interactions. Give your health care provider a list of all the medicines, herbs, non-prescription drugs, or dietary supplements you use. Also tell them if you smoke, drink alcohol, or use illegaldrugs. Some items may interact with your medicine. What should I watch for while using this medication? Your condition will be monitored carefully while you are receiving  thismedicine. You may need blood work done while you are taking this medicine. You may get drowsy or dizzy. Do not drive, use machinery, or do anything that needs mental alertness until you know how this medicine affects you. Do not stand up or sit up quickly, especially if you are an older patient. Thisreduces the risk of dizzy or fainting spells This medicine may increase your risk of getting an infection. Call your health care provider for advice if you get a fever, chills, sore throat, or other symptoms of a cold or flu. Do not treat yourself. Try to avoid being aroundpeople who are sick. Check with your health care provider if you have severe diarrhea, nausea, and vomiting, or if you sweat a lot. The loss of too much body fluid may make itdangerous for you to take this medicine. Do not become pregnant while taking this medicine or for 7 months after stopping it. Women should inform their health care provider if they wish to become pregnant or think they might be pregnant. Men should not father a child while taking this medicine and for 4 months after stopping it. There is a potential for serious harm to an unborn child. Talk to your health care provider for more information. Do not breast-feed an infant while taking thismedicine or for 2 months after stopping it. This medicine may make it more difficult to get pregnant or father a child.Talk to your health care provider if you are concerned about your fertility. What side effects may I notice from receiving this medication? Side effects that you should report to your doctor or health care professionalas soon as possible: allergic reactions (skin rash; itching or hives; swelling   of the face, lips, or tongue) bleeding (bloody or black, tarry stools; red or dark brown urine; spitting up blood or brown material that looks like coffee grounds; red spots on the skin; unusual bruising or bleeding from the eye, gums, or nose) blurred vision or changes in  vision confusion constipation headache heart failure (trouble breathing; fast, irregular heartbeat; sudden weight gain; swelling of the ankles, feet, hands) infection (fever, chills, cough, sore throat, pain or trouble passing urine) lack or loss of appetite liver injury (dark yellow or brown urine; general ill feeling or flu-like symptoms; loss of appetite, right upper belly pain; yellowing of the eyes or skin) low blood pressure (dizziness; feeling faint or lightheaded, falls; unusually weak or tired) muscle cramps pain, redness, or irritation at site where injected pain, tingling, numbness in the hands or feet seizures trouble breathing unusual bruising or bleeding Side effects that usually do not require medical attention (report to yourdoctor or health care professional if they continue or are bothersome): diarrhea nausea stomach pain trouble sleeping vomiting This list may not describe all possible side effects. Call your doctor for medical advice about side effects. You may report side effects to FDA at1-800-FDA-1088. Where should I keep my medication? This medicine is given in a hospital or clinic. It will not be stored at home. NOTE: This sheet is a summary. It may not cover all possible information. If you have questions about this medicine, talk to your doctor, pharmacist, orhealth care provider.  2022 Elsevier/Gold Standard (2020-03-27 13:22:53)  

## 2020-11-26 ENCOUNTER — Inpatient Hospital Stay: Payer: Medicare Other

## 2020-11-26 ENCOUNTER — Other Ambulatory Visit: Payer: Medicare Other

## 2020-12-03 ENCOUNTER — Other Ambulatory Visit: Payer: Self-pay

## 2020-12-03 ENCOUNTER — Inpatient Hospital Stay: Payer: Medicare Other

## 2020-12-03 ENCOUNTER — Inpatient Hospital Stay: Payer: Medicare Other | Attending: Hematology & Oncology

## 2020-12-03 VITALS — BP 131/83 | HR 64 | Temp 98.7°F | Resp 18

## 2020-12-03 DIAGNOSIS — Z5112 Encounter for antineoplastic immunotherapy: Secondary | ICD-10-CM | POA: Insufficient documentation

## 2020-12-03 DIAGNOSIS — C9001 Multiple myeloma in remission: Secondary | ICD-10-CM

## 2020-12-03 DIAGNOSIS — C9000 Multiple myeloma not having achieved remission: Secondary | ICD-10-CM | POA: Diagnosis not present

## 2020-12-03 LAB — CBC WITH DIFFERENTIAL (CANCER CENTER ONLY)
Abs Immature Granulocytes: 0 10*3/uL (ref 0.00–0.07)
Basophils Absolute: 0 10*3/uL (ref 0.0–0.1)
Basophils Relative: 1 %
Eosinophils Absolute: 0.1 10*3/uL (ref 0.0–0.5)
Eosinophils Relative: 3 %
HCT: 36.1 % — ABNORMAL LOW (ref 39.0–52.0)
Hemoglobin: 13 g/dL (ref 13.0–17.0)
Immature Granulocytes: 0 %
Lymphocytes Relative: 37 %
Lymphs Abs: 0.8 10*3/uL (ref 0.7–4.0)
MCH: 36.4 pg — ABNORMAL HIGH (ref 26.0–34.0)
MCHC: 36 g/dL (ref 30.0–36.0)
MCV: 101.1 fL — ABNORMAL HIGH (ref 80.0–100.0)
Monocytes Absolute: 0.2 10*3/uL (ref 0.1–1.0)
Monocytes Relative: 8 %
Neutro Abs: 1.1 10*3/uL — ABNORMAL LOW (ref 1.7–7.7)
Neutrophils Relative %: 51 %
Platelet Count: 136 10*3/uL — ABNORMAL LOW (ref 150–400)
RBC: 3.57 MIL/uL — ABNORMAL LOW (ref 4.22–5.81)
RDW: 12.7 % (ref 11.5–15.5)
WBC Count: 2.2 10*3/uL — ABNORMAL LOW (ref 4.0–10.5)
nRBC: 0 % (ref 0.0–0.2)

## 2020-12-03 LAB — CMP (CANCER CENTER ONLY)
ALT: 49 U/L — ABNORMAL HIGH (ref 0–44)
AST: 35 U/L (ref 15–41)
Albumin: 4.2 g/dL (ref 3.5–5.0)
Alkaline Phosphatase: 48 U/L (ref 38–126)
Anion gap: 8 (ref 5–15)
BUN: 21 mg/dL — ABNORMAL HIGH (ref 6–20)
CO2: 24 mmol/L (ref 22–32)
Calcium: 8.9 mg/dL (ref 8.9–10.3)
Chloride: 105 mmol/L (ref 98–111)
Creatinine: 1.53 mg/dL — ABNORMAL HIGH (ref 0.61–1.24)
GFR, Estimated: 53 mL/min — ABNORMAL LOW (ref >60)
Glucose, Bld: 61 mg/dL — ABNORMAL LOW (ref 70–99)
Potassium: 4.4 mmol/L (ref 3.5–5.1)
Sodium: 137 mmol/L (ref 135–145)
Total Bilirubin: 0.7 mg/dL (ref 0.3–1.2)
Total Protein: 7.2 g/dL (ref 6.5–8.1)

## 2020-12-03 LAB — LACTATE DEHYDROGENASE: LDH: 151 U/L (ref 98–192)

## 2020-12-03 MED ORDER — BORTEZOMIB CHEMO SQ INJECTION 3.5 MG (2.5MG/ML)
3.0000 mg | Freq: Once | INTRAMUSCULAR | Status: AC
  Administered 2020-12-03: 3 mg via SUBCUTANEOUS
  Filled 2020-12-03: qty 1.2

## 2020-12-03 NOTE — Patient Instructions (Signed)
[facility]  Discharge Instructions: Thank you for choosing [facility] to provide your oncology and hematology care.   If you have a lab appointment with the [facility], please go directly to the [facility] and check in at the registration area.  Wear comfortable clothing and clothing appropriate for easy access to any Portacath or PICC line.   We strive to give you quality time with your provider. You may need to reschedule your appointment if you arrive late (15 or more minutes).  Arriving late affects you and other patients whose appointments are after yours.  Also, if you miss three or more appointments without notifying the office, you may be dismissed from the clinic at the provider's discretion.      For prescription refill requests, have your pharmacy contact our office and allow 72 hours for refills to be completed.    Today you received the following chemotherapy and/or immunotherapy agents Velcade.   To help prevent nausea and vomiting after your treatment, we encourage you to take your nausea medication as directed.  BELOW ARE SYMPTOMS THAT SHOULD BE REPORTED IMMEDIATELY: *FEVER GREATER THAN 100.4 F (38 C) OR HIGHER *CHILLS OR SWEATING *NAUSEA AND VOMITING THAT IS NOT CONTROLLED WITH YOUR NAUSEA MEDICATION *UNUSUAL SHORTNESS OF BREATH *UNUSUAL BRUISING OR BLEEDING *URINARY PROBLEMS (pain or burning when urinating, or frequent urination) *BOWEL PROBLEMS (unusual diarrhea, constipation, pain near the anus) TENDERNESS IN MOUTH AND THROAT WITH OR WITHOUT PRESENCE OF ULCERS (sore throat, sores in mouth, or a toothache) UNUSUAL RASH, SWELLING OR PAIN  UNUSUAL VAGINAL DISCHARGE OR ITCHING   Items with * indicate a potential emergency and should be followed up as soon as possible or go to the Emergency Department if any problems should occur.  Please show the CHEMOTHERAPY ALERT CARD or IMMUNOTHERAPY ALERT CARD at check-in to the  Emergency Department and triage nurse. Should you have questions after your visit or need to cancel or reschedule your appointment, please contact [facility]  [phone] and follow the prompts.  Office hours are 8:00 a.m. to 4:30 p.m. Monday - Friday. Please note that voicemails left after 4:00 p.m. may not be returned until the following business day.  We are closed weekends and major holidays. You have access to a nurse at all times for urgent questions. Please call the main number to the clinic [phone] and follow the prompts.  For any non-urgent questions, you may also contact your provider using MyChart. We now offer e-Visits for anyone 67 and older to request care online for non-urgent symptoms. For details visit [email or web address].   Also download the MyChart app! Go to the app store, search "MyChart", open the app, select [facility], and log in with your MyChart username and password.  Due to Covid, a mask is required upon entering the hospital/clinic. If you do not have a mask, one will be given to you upon arrival. For doctor visits, patients may have 1 support person aged 28 or older with them. For treatment visits, patients cannot have anyone with them due to current Covid guidelines and our immunocompromised population.

## 2020-12-10 ENCOUNTER — Ambulatory Visit: Payer: Medicare Other | Admitting: Hematology & Oncology

## 2020-12-10 ENCOUNTER — Other Ambulatory Visit: Payer: Medicare Other

## 2020-12-10 ENCOUNTER — Inpatient Hospital Stay: Payer: Medicare Other

## 2020-12-11 ENCOUNTER — Other Ambulatory Visit: Payer: Self-pay | Admitting: *Deleted

## 2020-12-11 DIAGNOSIS — C9002 Multiple myeloma in relapse: Secondary | ICD-10-CM

## 2020-12-11 MED ORDER — LENALIDOMIDE 10 MG PO CAPS: ORAL_CAPSULE | ORAL | 0 refills | Status: DC

## 2020-12-15 DIAGNOSIS — Z0000 Encounter for general adult medical examination without abnormal findings: Secondary | ICD-10-CM | POA: Diagnosis not present

## 2020-12-15 DIAGNOSIS — Z23 Encounter for immunization: Secondary | ICD-10-CM | POA: Diagnosis not present

## 2020-12-17 ENCOUNTER — Inpatient Hospital Stay: Payer: Medicare Other

## 2020-12-17 ENCOUNTER — Telehealth: Payer: Self-pay | Admitting: *Deleted

## 2020-12-17 ENCOUNTER — Inpatient Hospital Stay (HOSPITAL_BASED_OUTPATIENT_CLINIC_OR_DEPARTMENT_OTHER): Payer: Medicare Other | Admitting: Hematology & Oncology

## 2020-12-17 ENCOUNTER — Other Ambulatory Visit: Payer: Self-pay

## 2020-12-17 ENCOUNTER — Encounter: Payer: Self-pay | Admitting: Hematology & Oncology

## 2020-12-17 VITALS — BP 120/86 | HR 61 | Temp 97.9°F | Resp 16 | Wt 209.4 lb

## 2020-12-17 DIAGNOSIS — C9001 Multiple myeloma in remission: Secondary | ICD-10-CM

## 2020-12-17 DIAGNOSIS — C9000 Multiple myeloma not having achieved remission: Secondary | ICD-10-CM | POA: Diagnosis not present

## 2020-12-17 DIAGNOSIS — Z5112 Encounter for antineoplastic immunotherapy: Secondary | ICD-10-CM | POA: Diagnosis not present

## 2020-12-17 LAB — CBC WITH DIFFERENTIAL (CANCER CENTER ONLY)
Abs Immature Granulocytes: 0.01 10*3/uL (ref 0.00–0.07)
Basophils Absolute: 0 10*3/uL (ref 0.0–0.1)
Basophils Relative: 0 %
Eosinophils Absolute: 0.1 10*3/uL (ref 0.0–0.5)
Eosinophils Relative: 6 %
HCT: 37.1 % — ABNORMAL LOW (ref 39.0–52.0)
Hemoglobin: 13.2 g/dL (ref 13.0–17.0)
Immature Granulocytes: 0 %
Lymphocytes Relative: 32 %
Lymphs Abs: 0.8 10*3/uL (ref 0.7–4.0)
MCH: 36.4 pg — ABNORMAL HIGH (ref 26.0–34.0)
MCHC: 35.6 g/dL (ref 30.0–36.0)
MCV: 102.2 fL — ABNORMAL HIGH (ref 80.0–100.0)
Monocytes Absolute: 0.3 10*3/uL (ref 0.1–1.0)
Monocytes Relative: 13 %
Neutro Abs: 1.1 10*3/uL — ABNORMAL LOW (ref 1.7–7.7)
Neutrophils Relative %: 49 %
Platelet Count: 120 10*3/uL — ABNORMAL LOW (ref 150–400)
RBC: 3.63 MIL/uL — ABNORMAL LOW (ref 4.22–5.81)
RDW: 12.9 % (ref 11.5–15.5)
WBC Count: 2.4 10*3/uL — ABNORMAL LOW (ref 4.0–10.5)
nRBC: 0 % (ref 0.0–0.2)

## 2020-12-17 LAB — CMP (CANCER CENTER ONLY)
ALT: 62 U/L — ABNORMAL HIGH (ref 0–44)
AST: 35 U/L (ref 15–41)
Albumin: 4.3 g/dL (ref 3.5–5.0)
Alkaline Phosphatase: 54 U/L (ref 38–126)
Anion gap: 8 (ref 5–15)
BUN: 16 mg/dL (ref 6–20)
CO2: 27 mmol/L (ref 22–32)
Calcium: 8.8 mg/dL — ABNORMAL LOW (ref 8.9–10.3)
Chloride: 103 mmol/L (ref 98–111)
Creatinine: 1.57 mg/dL — ABNORMAL HIGH (ref 0.61–1.24)
GFR, Estimated: 51 mL/min — ABNORMAL LOW (ref >60)
Glucose, Bld: 96 mg/dL (ref 70–99)
Potassium: 4.1 mmol/L (ref 3.5–5.1)
Sodium: 138 mmol/L (ref 135–145)
Total Bilirubin: 0.8 mg/dL (ref 0.3–1.2)
Total Protein: 7.1 g/dL (ref 6.5–8.1)

## 2020-12-17 LAB — LACTATE DEHYDROGENASE: LDH: 143 U/L (ref 98–192)

## 2020-12-17 MED ORDER — BORTEZOMIB CHEMO SQ INJECTION 3.5 MG (2.5MG/ML)
3.0000 mg | Freq: Once | INTRAMUSCULAR | Status: AC
  Administered 2020-12-17: 3 mg via SUBCUTANEOUS
  Filled 2020-12-17: qty 1.2

## 2020-12-17 NOTE — Progress Notes (Signed)
Hematology and Oncology Follow Up Visit  Tyler Phillips [id number] 1965-08-30 55 y.o. 12/17/2020   Principle Diagnosis:  IgG Kappa myeloma - +4, +14, +17   Past Therapy: Status post autologous stem cell transplant on 05/22/2015   S/p cycle 6 of RVD Ninlaro 4 mg po q 2 week -- start on 01/02/2019 -- d/c on 02/10/2019   Current Therapy:        Velcade q 2wk dosing Revlimid '10mg'$  po q day (21/7)    Interim History:  [name] is here today for follow-up.  As always, he is back on a great adventure.  This weekend, he is flying out to [place].  He is going to meet some friends and they are going to travel across [place].  I am sure they will have a fantastic time.  He is doing nicely.  Is retired.  However he does quite a bit of work.  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  His myeloma is doing fantastic.  We last saw him, there is no monoclonal spike in his blood.  We are watching the Kappa light chain.  His last kappa light chain back in July was 3.6 mg/dL.  He has had no problems with pain.  He has had no rashes.  There is been no cough or shortness of breath.  He has had no leg swelling.  He has had no rashes.  There is been no obvious change in bowel or bladder habits.  He has had some diarrhea.  I think he takes Imodium for this.  He is on the Revlimid.  He is doing well with Revlimid.  Overall, his performance status is ECOG 0.    Medications:  Allergies as of 12/17/2020   No Known Allergies      Medication List        Accurate as of December 17, 2020 12:21 PM. If you have any questions, ask your nurse or doctor.          aspirin 325 MG tablet Take 325 mg by mouth daily.   bortezomib IV 3.5 MG injection Commonly known as: VELCADE Inject into the vein every 14 (fourteen) days.   cetirizine 10 MG tablet Commonly known as: ZYRTEC Take 10 mg by mouth daily.   lenalidomide 10 MG capsule Commonly known as: Revlimid TAKE 1  CAPSULE BY MOUTH  DAILY FOR 21 DAYS, THEN 7  DAYS OFF Auth # [id number]   LORazepam 1 MG tablet Commonly known as: ATIVAN Take 1 tablet (1 mg total) by mouth every 4 (four) hours as needed.   Pfizer-BioNTech COVID-19 Vacc 30 MCG/0.3ML injection Generic drug: COVID-19 mRNA vaccine (Pfizer)   valACYclovir 500 MG tablet Commonly known as: VALTREX Take 500 mg by mouth 2 (two) times daily.   zolpidem 10 MG tablet Commonly known as: AMBIEN Take 1 tablet (10 mg total) by mouth at bedtime as needed for sleep.        Allergies: No Known Allergies  Past Medical History, Surgical history, Social history, and Family History were reviewed and updated.  Review of Systems: Review of Systems  Constitutional: Negative.   HENT: Negative.    Eyes: Negative.   Respiratory: Negative.    Cardiovascular: Negative.   Gastrointestinal: Negative.   Genitourinary: Negative.   Musculoskeletal: Negative.   Skin: Negative.   Neurological: Negative.   Endo/Heme/Allergies: Negative.   Psychiatric/Behavioral: Negative.      Physical Exam:  weight is 209 lb 6.4 oz (95 kg). His oral temperature is 97.9 F (36.6 C). His blood pressure is 120/86 and his pulse is 61. His respiration is 16 and oxygen saturation is 100%.   Wt Readings from Last 3 Encounters:  12/17/20 209 lb 6.4 oz (95 kg)  10/29/20 214 lb 12.8 oz (97.4 kg)  07/18/20 216 lb (98 kg)   Physical Exam Vitals reviewed.  HENT:     Head: Normocephalic and atraumatic.  Eyes:     Pupils: Pupils are equal, round, and reactive to light.  Cardiovascular:     Rate and Rhythm: Normal rate and regular rhythm.     Heart sounds: Normal heart sounds.  Pulmonary:     Effort: Pulmonary effort is normal.     Breath sounds: Normal breath sounds.  Abdominal:     General: Bowel sounds are normal.     Palpations: Abdomen is soft.  Musculoskeletal:        General: No tenderness or deformity. Normal range of motion.     Cervical back: Normal range of  motion.  Lymphadenopathy:     Cervical: No cervical adenopathy.  Skin:    General: Skin is warm and dry.     Findings: No erythema or rash.  Neurological:     Mental Status: He is alert and oriented to person, place, and time.  Psychiatric:        Behavior: Behavior normal.        Thought Content: Thought content normal.        Judgment: Judgment normal.     Lab Results  Component Value Date   WBC 2.4 (L) 12/17/2020   HGB 13.2 12/17/2020   HCT 37.1 (L) 12/17/2020   MCV 102.2 (H) 12/17/2020   PLT 120 (L) 12/17/2020   Lab Results  Component Value Date   FERRITIN 1,263 (H) 07/20/2014   IRON 125 07/20/2014   TIBC 198 (L) 07/20/2014   UIBC 73 (L) 07/20/2014   IRONPCTSAT 63 (H) 07/20/2014   Lab Results  Component Value Date   RETICCTPCT 0.8 07/20/2014   RBC 3.63 (L) 12/17/2020   Lab Results  Component Value Date   KPAFRELGTCHN 36.0 (H) 10/29/2020   LAMBDASER 27.8 (H) 10/29/2020   KAPLAMBRATIO 1.29 10/29/2020   Lab Results  Component Value Date   IGGSERUM 1,262 10/29/2020   IGA 226 10/29/2020   IGMSERUM 21 10/29/2020   Lab Results  Component Value Date   TOTALPROTELP 6.8 10/29/2020   ALBUMINELP 4.0 10/29/2020   A1GS 0.1 10/29/2020   A2GS 0.5 10/29/2020   BETS 0.9 10/29/2020   BETA2SER 0.3 03/28/2015   GAMS 1.2 10/29/2020   MSPIKE Not Observed 10/29/2020   SPEI Comment 09/23/2020     Chemistry      Component Value Date/Time   NA 138 12/17/2020 0835   NA 140 04/22/2017 1140   NA 138 09/03/2015 1042   K 4.1 12/17/2020 0835   K 4.0 04/22/2017 1140   K 4.3 09/03/2015 1042   CL 103 12/17/2020 0835   CL 105 04/22/2017 1140   CO2 27 12/17/2020 0835   CO2 24 04/22/2017 1140   CO2 25 09/03/2015 1042   BUN 16 12/17/2020 0835   BUN 16 04/22/2017 1140   BUN 21.7 09/03/2015 1042   CREATININE 1.57 (H) 12/17/2020 0835   CREATININE 1.6 (H) 04/22/2017 1140   CREATININE 1.2 09/03/2015 1042      Component Value Date/Time   CALCIUM 8.8 (L) 12/17/2020 [id number]  CALCIUM 8.5 04/22/2017 1140   CALCIUM 9.1 09/03/2015 1042   ALKPHOS 54 12/17/2020 0835   ALKPHOS 59 04/22/2017 1140   ALKPHOS 42 09/03/2015 1042   AST 35 12/17/2020 0835   AST 27 09/03/2015 1042   ALT 62 (H) 12/17/2020 0835   ALT 45 04/22/2017 1140   ALT 41 09/03/2015 1042   BILITOT 0.8 12/17/2020 0835   BILITOT 0.54 09/03/2015 1042       Impression and Plan: [name] is a very pleasant 55 yo caucasian gentleman with IgG kappa myeloma. He underwent induction chemotherapy with RVD followed by an autologous stem cell transplant at [facility] on February 2017.   I am very happy that everything is going well so far.  He really has done nicely.  Again, the biology of his myeloma is such that he is just very chemo responsive.  We will continue him on the Velcade every other week.  He does his Revlimid 3 weeks on and 1 week off.  I will plan to see him back in 6 weeks since everything is going well for him.   [name], MD 8/31/202212:21 PM

## 2020-12-17 NOTE — Progress Notes (Signed)
Reviewed pt labs with Dr. [name] and pt ok to treat with ANC 1.1

## 2020-12-17 NOTE — Patient Instructions (Signed)
[facility] Discharge Instructions for Patients Receiving Chemotherapy  Today you received the following chemotherapy agents Velcade To help prevent nausea and vomiting after your treatment, we encourage you to take your nausea medication as prescribed.   If you develop nausea and vomiting that is not controlled by your nausea medication, call the clinic.   BELOW ARE SYMPTOMS THAT SHOULD BE REPORTED IMMEDIATELY:  *FEVER GREATER THAN 100.5 F  *CHILLS WITH OR WITHOUT FEVER  NAUSEA AND VOMITING THAT IS NOT CONTROLLED WITH YOUR NAUSEA MEDICATION  *UNUSUAL SHORTNESS OF BREATH  *UNUSUAL BRUISING OR BLEEDING  TENDERNESS IN MOUTH AND THROAT WITH OR WITHOUT PRESENCE OF ULCERS  *URINARY PROBLEMS  *BOWEL PROBLEMS  UNUSUAL RASH Items with * indicate a potential emergency and should be followed up as soon as possible.  Feel free to call the clinic should you have any questions or concerns. The clinic phone number is [phone].  Please show the CHEMO ALERT CARD at check-in to the Emergency Department and triage nurse.   

## 2020-12-17 NOTE — Telephone Encounter (Signed)
Per 12/17/20 los - called and gave upcoming appointments - requested call back to confirm - mailed calendar

## 2020-12-18 LAB — KAPPA/LAMBDA LIGHT CHAINS
Kappa free light chain: 48.4 mg/L — ABNORMAL HIGH (ref 3.3–19.4)
Kappa, lambda light chain ratio: 1.46 (ref 0.26–1.65)
Lambda free light chains: 33.1 mg/L — ABNORMAL HIGH (ref 5.7–26.3)

## 2020-12-18 LAB — IGG, IGA, IGM
IgA: 223 mg/dL (ref 90–386)
IgG (Immunoglobin G), Serum: 1205 mg/dL (ref 603–1613)
IgM (Immunoglobulin M), Srm: 20 mg/dL (ref 20–172)

## 2020-12-23 LAB — PROTEIN ELECTROPHORESIS, SERUM
A/G Ratio: 1.4 (ref 0.7–1.7)
Albumin ELP: 4 g/dL (ref 2.9–4.4)
Alpha-1-Globulin: 0.2 g/dL (ref 0.0–0.4)
Alpha-2-Globulin: 0.5 g/dL (ref 0.4–1.0)
Beta Globulin: 0.9 g/dL (ref 0.7–1.3)
Gamma Globulin: 1.3 g/dL (ref 0.4–1.8)
Globulin, Total: 2.8 g/dL (ref 2.2–3.9)
Total Protein ELP: 6.8 g/dL (ref 6.0–8.5)

## 2020-12-29 ENCOUNTER — Telehealth: Payer: Self-pay

## 2020-12-31 ENCOUNTER — Inpatient Hospital Stay: Payer: Medicare Other

## 2020-12-31 ENCOUNTER — Other Ambulatory Visit: Payer: Medicare Other

## 2021-01-08 ENCOUNTER — Other Ambulatory Visit: Payer: Self-pay | Admitting: Hematology & Oncology

## 2021-01-08 ENCOUNTER — Other Ambulatory Visit: Payer: Self-pay | Admitting: *Deleted

## 2021-01-08 DIAGNOSIS — C9002 Multiple myeloma in relapse: Secondary | ICD-10-CM

## 2021-01-08 MED ORDER — LENALIDOMIDE 10 MG PO CAPS: ORAL_CAPSULE | ORAL | 0 refills | Status: DC

## 2021-01-13 ENCOUNTER — Other Ambulatory Visit: Payer: Self-pay

## 2021-01-13 DIAGNOSIS — C9001 Multiple myeloma in remission: Secondary | ICD-10-CM

## 2021-01-14 ENCOUNTER — Other Ambulatory Visit: Payer: Self-pay

## 2021-01-14 ENCOUNTER — Inpatient Hospital Stay: Payer: Medicare Other

## 2021-01-14 ENCOUNTER — Inpatient Hospital Stay: Payer: Medicare Other | Attending: Hematology & Oncology

## 2021-01-14 VITALS — BP 142/86 | HR 55 | Temp 97.9°F | Resp 17

## 2021-01-14 DIAGNOSIS — C9001 Multiple myeloma in remission: Secondary | ICD-10-CM

## 2021-01-14 DIAGNOSIS — Z5112 Encounter for antineoplastic immunotherapy: Secondary | ICD-10-CM | POA: Diagnosis not present

## 2021-01-14 DIAGNOSIS — C9000 Multiple myeloma not having achieved remission: Secondary | ICD-10-CM | POA: Diagnosis not present

## 2021-01-14 DIAGNOSIS — Z79899 Other long term (current) drug therapy: Secondary | ICD-10-CM | POA: Diagnosis not present

## 2021-01-14 LAB — CBC WITH DIFFERENTIAL (CANCER CENTER ONLY)
Abs Immature Granulocytes: 0 10*3/uL (ref 0.00–0.07)
Basophils Absolute: 0 10*3/uL (ref 0.0–0.1)
Basophils Relative: 1 %
Eosinophils Absolute: 0.1 10*3/uL (ref 0.0–0.5)
Eosinophils Relative: 4 %
HCT: 38.8 % — ABNORMAL LOW (ref 39.0–52.0)
Hemoglobin: 13.8 g/dL (ref 13.0–17.0)
Immature Granulocytes: 0 %
Lymphocytes Relative: 34 %
Lymphs Abs: 0.9 10*3/uL (ref 0.7–4.0)
MCH: 36 pg — ABNORMAL HIGH (ref 26.0–34.0)
MCHC: 35.6 g/dL (ref 30.0–36.0)
MCV: 101.3 fL — ABNORMAL HIGH (ref 80.0–100.0)
Monocytes Absolute: 0.3 10*3/uL (ref 0.1–1.0)
Monocytes Relative: 11 %
Neutro Abs: 1.3 10*3/uL — ABNORMAL LOW (ref 1.7–7.7)
Neutrophils Relative %: 50 %
Platelet Count: 130 10*3/uL — ABNORMAL LOW (ref 150–400)
RBC: 3.83 MIL/uL — ABNORMAL LOW (ref 4.22–5.81)
RDW: 13.2 % (ref 11.5–15.5)
WBC Count: 2.7 10*3/uL — ABNORMAL LOW (ref 4.0–10.5)
nRBC: 0 % (ref 0.0–0.2)

## 2021-01-14 LAB — CMP (CANCER CENTER ONLY)
ALT: 58 U/L — ABNORMAL HIGH (ref 0–44)
AST: 30 U/L (ref 15–41)
Albumin: 4.3 g/dL (ref 3.5–5.0)
Alkaline Phosphatase: 55 U/L (ref 38–126)
Anion gap: 7 (ref 5–15)
BUN: 13 mg/dL (ref 6–20)
CO2: 27 mmol/L (ref 22–32)
Calcium: 9.2 mg/dL (ref 8.9–10.3)
Chloride: 104 mmol/L (ref 98–111)
Creatinine: 1.37 mg/dL — ABNORMAL HIGH (ref 0.61–1.24)
GFR, Estimated: >60 mL/min (ref >60)
Glucose, Bld: 92 mg/dL (ref 70–99)
Potassium: 3.9 mmol/L (ref 3.5–5.1)
Sodium: 138 mmol/L (ref 135–145)
Total Bilirubin: 0.7 mg/dL (ref 0.3–1.2)
Total Protein: 7 g/dL (ref 6.5–8.1)

## 2021-01-14 MED ORDER — BORTEZOMIB CHEMO SQ INJECTION 3.5 MG (2.5MG/ML)
3.0000 mg | Freq: Once | INTRAMUSCULAR | Status: AC
  Administered 2021-01-14: 3 mg via SUBCUTANEOUS
  Filled 2021-01-14: qty 1.2

## 2021-01-14 NOTE — Patient Instructions (Signed)
[facility] Discharge Instructions for Patients Receiving Chemotherapy  Today you received the following chemotherapy agents Velcade To help prevent nausea and vomiting after your treatment, we encourage you to take your nausea medication as prescribed.   If you develop nausea and vomiting that is not controlled by your nausea medication, call the clinic.   BELOW ARE SYMPTOMS THAT SHOULD BE REPORTED IMMEDIATELY:  *FEVER GREATER THAN 100.5 F  *CHILLS WITH OR WITHOUT FEVER  NAUSEA AND VOMITING THAT IS NOT CONTROLLED WITH YOUR NAUSEA MEDICATION  *UNUSUAL SHORTNESS OF BREATH  *UNUSUAL BRUISING OR BLEEDING  TENDERNESS IN MOUTH AND THROAT WITH OR WITHOUT PRESENCE OF ULCERS  *URINARY PROBLEMS  *BOWEL PROBLEMS  UNUSUAL RASH Items with * indicate a potential emergency and should be followed up as soon as possible.  Feel free to call the clinic should you have any questions or concerns. The clinic phone number is [phone].  Please show the CHEMO ALERT CARD at check-in to the Emergency Department and triage nurse.   

## 2021-01-14 NOTE — Progress Notes (Signed)
Reviewed pt labs with Dr. [name] and pt ok to treat with ANC 1.3.  Reviewed neutropenic precautions with pt emphasizing good hand washing and fever guidelines. Pt verbalized understanding and had no further questions.

## 2021-01-30 ENCOUNTER — Inpatient Hospital Stay: Payer: Medicare Other

## 2021-01-30 ENCOUNTER — Other Ambulatory Visit: Payer: Medicare Other

## 2021-01-30 ENCOUNTER — Ambulatory Visit: Payer: Medicare Other | Admitting: Hematology & Oncology

## 2021-02-03 ENCOUNTER — Other Ambulatory Visit: Payer: Self-pay

## 2021-02-03 ENCOUNTER — Encounter: Payer: Self-pay | Admitting: Family

## 2021-02-03 ENCOUNTER — Inpatient Hospital Stay (HOSPITAL_BASED_OUTPATIENT_CLINIC_OR_DEPARTMENT_OTHER): Payer: Medicare Other | Admitting: Family

## 2021-02-03 ENCOUNTER — Inpatient Hospital Stay: Payer: Medicare Other

## 2021-02-03 ENCOUNTER — Inpatient Hospital Stay: Payer: Medicare Other | Attending: Hematology & Oncology

## 2021-02-03 VITALS — BP 155/91 | HR 56 | Temp 98.0°F | Resp 17 | Wt 211.1 lb

## 2021-02-03 DIAGNOSIS — C9001 Multiple myeloma in remission: Secondary | ICD-10-CM

## 2021-02-03 DIAGNOSIS — C9000 Multiple myeloma not having achieved remission: Secondary | ICD-10-CM

## 2021-02-03 DIAGNOSIS — Z5112 Encounter for antineoplastic immunotherapy: Secondary | ICD-10-CM | POA: Insufficient documentation

## 2021-02-03 DIAGNOSIS — Z79899 Other long term (current) drug therapy: Secondary | ICD-10-CM | POA: Insufficient documentation

## 2021-02-03 LAB — CBC WITH DIFFERENTIAL (CANCER CENTER ONLY)
Abs Immature Granulocytes: 0 10*3/uL (ref 0.00–0.07)
Basophils Absolute: 0 10*3/uL (ref 0.0–0.1)
Basophils Relative: 1 %
Eosinophils Absolute: 0.1 10*3/uL (ref 0.0–0.5)
Eosinophils Relative: 2 %
HCT: 37.3 % — ABNORMAL LOW (ref 39.0–52.0)
Hemoglobin: 13.3 g/dL (ref 13.0–17.0)
Immature Granulocytes: 0 %
Lymphocytes Relative: 28 %
Lymphs Abs: 0.8 10*3/uL (ref 0.7–4.0)
MCH: 36.4 pg — ABNORMAL HIGH (ref 26.0–34.0)
MCHC: 35.7 g/dL (ref 30.0–36.0)
MCV: 102.2 fL — ABNORMAL HIGH (ref 80.0–100.0)
Monocytes Absolute: 0.3 10*3/uL (ref 0.1–1.0)
Monocytes Relative: 10 %
Neutro Abs: 1.7 10*3/uL (ref 1.7–7.7)
Neutrophils Relative %: 59 %
Platelet Count: 137 10*3/uL — ABNORMAL LOW (ref 150–400)
RBC: 3.65 MIL/uL — ABNORMAL LOW (ref 4.22–5.81)
RDW: 13.4 % (ref 11.5–15.5)
WBC Count: 2.9 10*3/uL — ABNORMAL LOW (ref 4.0–10.5)
nRBC: 0 % (ref 0.0–0.2)

## 2021-02-03 LAB — CMP (CANCER CENTER ONLY)
ALT: 58 U/L — ABNORMAL HIGH (ref 0–44)
AST: 32 U/L (ref 15–41)
Albumin: 4.3 g/dL (ref 3.5–5.0)
Alkaline Phosphatase: 62 U/L (ref 38–126)
Anion gap: 8 (ref 5–15)
BUN: 15 mg/dL (ref 6–20)
CO2: 26 mmol/L (ref 22–32)
Calcium: 9.3 mg/dL (ref 8.9–10.3)
Chloride: 105 mmol/L (ref 98–111)
Creatinine: 1.33 mg/dL — ABNORMAL HIGH (ref 0.61–1.24)
GFR, Estimated: >60 mL/min (ref >60)
Glucose, Bld: 79 mg/dL (ref 70–99)
Potassium: 4 mmol/L (ref 3.5–5.1)
Sodium: 139 mmol/L (ref 135–145)
Total Bilirubin: 0.8 mg/dL (ref 0.3–1.2)
Total Protein: 7.4 g/dL (ref 6.5–8.1)

## 2021-02-03 LAB — LACTATE DEHYDROGENASE: LDH: 149 U/L (ref 98–192)

## 2021-02-03 MED ORDER — BORTEZOMIB CHEMO SQ INJECTION 3.5 MG (2.5MG/ML)
3.0000 mg | Freq: Once | INTRAMUSCULAR | Status: AC
  Administered 2021-02-03: 3 mg via SUBCUTANEOUS
  Filled 2021-02-03: qty 1.2

## 2021-02-03 NOTE — Progress Notes (Signed)
Hematology and Oncology Follow Up Visit  Tyler Phillips [id number] Dec 30, 1965 55 y.o. 02/03/2021   Principle Diagnosis:  IgG Kappa myeloma - +4, +14, +17   Past Therapy: Status post autologous stem cell transplant on 05/22/2015   S/p cycle 6 of RVD Ninlaro 4 mg po q 2 week -- start on 01/02/2019 -- d/c on 02/10/2019   Current Therapy:        Velcade q 2wk dosing Revlimid 10mg  po q day (21/7)    Interim History:  Tyler Phillips is here today for follow-up and Velcade injection. He is doing well and has no complaints at this time.  He has been running and swimming for exercise. He is taking a break from cross fit for a few weeks.  No M-spike observed in August. Kappa light chains were 4.84 mg/dL and IgG level 1,205 mg/dL.  No fever, chills, n/v, cough, rash, dizziness, SOB, chest pain, palpitations, abdominal pain or changes in bowel or bladder habits.  No swelling in his extremities.  Neuropathy in the lower extremities is unchanged from baseline.  No falls or syncope to report.  He is eating well and staying properly hydrated throughout the day. His weight is stable at 211 lbs.   ECOG Performance Status: 1 - Symptomatic but completely ambulatory  Medications:  Allergies as of 02/03/2021   No Known Allergies      Medication List        Accurate as of February 03, 2021  9:00 AM. If you have any questions, ask your nurse or doctor.          aspirin 325 MG tablet Take 325 mg by mouth daily.   bortezomib IV 3.5 MG injection Commonly known as: VELCADE Inject into the vein every 14 (fourteen) days.   cetirizine 10 MG tablet Commonly known as: ZYRTEC Take 10 mg by mouth daily.   lenalidomide 10 MG capsule Commonly known as: Revlimid TAKE 1 CAPSULE BY MOUTH  DAILY FOR 21 DAYS, THEN 7  DAYS OFF   LORazepam 1 MG tablet Commonly known as: ATIVAN Take 1 tablet (1 mg total) by mouth every 4 (four) hours as needed.   Pfizer-BioNTech COVID-19 Vacc 30 MCG/0.3ML  injection Generic drug: COVID-19 mRNA vaccine (Pfizer)   valACYclovir 500 MG tablet Commonly known as: VALTREX Take 500 mg by mouth 2 (two) times daily.   zolpidem 10 MG tablet Commonly known as: AMBIEN Take 1 tablet (10 mg total) by mouth at bedtime as needed for sleep.        Allergies: No Known Allergies  Past Medical History, Surgical history, Social history, and Family History were reviewed and updated.  Review of Systems: All other 10 point review of systems is negative.   Physical Exam:  weight is 211 lb 1.9 oz (95.8 kg). His oral temperature is 98 F (36.7 C). His blood pressure is 155/91 (abnormal) and his pulse is 56 (abnormal). His respiration is 17 and oxygen saturation is 100%.   Wt Readings from Last 3 Encounters:  02/03/21 211 lb 1.9 oz (95.8 kg)  12/17/20 209 lb 6.4 oz (95 kg)  10/29/20 214 lb 12.8 oz (97.4 kg)    Ocular: Sclerae unicteric, pupils equal, round and reactive to light Ear-nose-throat: Oropharynx clear, dentition fair Lymphatic: No cervical or supraclavicular adenopathy Lungs no rales or rhonchi, good excursion bilaterally Heart regular rate and rhythm, no murmur appreciated Abd soft, nontender, positive bowel sounds MSK no focal spinal tenderness, no joint edema Neuro: non-focal, well-oriented, appropriate affect Breasts:  Deferred   Lab Results  Component Value Date   WBC 2.9 (L) 02/03/2021   HGB 13.3 02/03/2021   HCT 37.3 (L) 02/03/2021   MCV 102.2 (H) 02/03/2021   PLT 137 (L) 02/03/2021   Lab Results  Component Value Date   FERRITIN 1,263 (H) 07/20/2014   IRON 125 07/20/2014   TIBC 198 (L) 07/20/2014   UIBC 73 (L) 07/20/2014   IRONPCTSAT 63 (H) 07/20/2014   Lab Results  Component Value Date   RETICCTPCT 0.8 07/20/2014   RBC 3.65 (L) 02/03/2021   Lab Results  Component Value Date   KPAFRELGTCHN 48.4 (H) 12/17/2020   LAMBDASER 33.1 (H) 12/17/2020   KAPLAMBRATIO 1.46 12/17/2020   Lab Results  Component Value Date    IGGSERUM 1,205 12/17/2020   IGA 223 12/17/2020   IGMSERUM 20 12/17/2020   Lab Results  Component Value Date   TOTALPROTELP 6.8 12/17/2020   ALBUMINELP 4.0 12/17/2020   A1GS 0.2 12/17/2020   A2GS 0.5 12/17/2020   BETS 0.9 12/17/2020   BETA2SER 0.3 03/28/2015   GAMS 1.3 12/17/2020   MSPIKE Not Observed 12/17/2020   SPEI Comment 12/17/2020     Chemistry      Component Value Date/Time   NA 139 02/03/2021 0824   NA 140 04/22/2017 1140   NA 138 09/03/2015 1042   K 4.0 02/03/2021 0824   K 4.0 04/22/2017 1140   K 4.3 09/03/2015 1042   CL 105 02/03/2021 0824   CL 105 04/22/2017 1140   CO2 26 02/03/2021 0824   CO2 24 04/22/2017 1140   CO2 25 09/03/2015 1042   BUN 15 02/03/2021 0824   BUN 16 04/22/2017 1140   BUN 21.7 09/03/2015 1042   CREATININE 1.33 (H) 02/03/2021 0824   CREATININE 1.6 (H) 04/22/2017 1140   CREATININE 1.2 09/03/2015 1042      Component Value Date/Time   CALCIUM 9.3 02/03/2021 0824   CALCIUM 8.5 04/22/2017 1140   CALCIUM 9.1 09/03/2015 1042   ALKPHOS 62 02/03/2021 0824   ALKPHOS 59 04/22/2017 1140   ALKPHOS 42 09/03/2015 1042   AST 32 02/03/2021 0824   AST 27 09/03/2015 1042   ALT 58 (H) 02/03/2021 0824   ALT 45 04/22/2017 1140   ALT 41 09/03/2015 1042   BILITOT 0.8 02/03/2021 0824   BILITOT 0.54 09/03/2015 1042       Impression and Plan: Tyler Phillips is a very pleasant 55 yo caucasian gentleman with IgG kappa myeloma. He underwent induction chemotherapy with RVD followed by an autologous stem cell transplant at [facility] on February 2017.  He continues to do well. We are monitoring his protein studies/light chains closely.  We will proceed with Velcade today as planned.  He will continue his every 2 week injection schedule with Velcade per Dr. [name].  Follow-up in 6 weeks.  He can contact our office with any questions or concerns.   Lottie Dawson, NP 10/18/20229:00 AM

## 2021-02-03 NOTE — Patient Instructions (Signed)
[facility] [place]  Discharge Instructions: Thank you for choosing [facility] to provide your oncology and hematology care.   If you have a lab appointment with the [facility], please go directly to the [facility] and check in at the registration area.  Wear comfortable clothing and clothing appropriate for easy access to any Portacath or PICC line.   We strive to give you quality time with your provider. You may need to reschedule your appointment if you arrive late (15 or more minutes).  Arriving late affects you and other patients whose appointments are after yours.  Also, if you miss three or more appointments without notifying the office, you may be dismissed from the clinic at the provider's discretion.      For prescription refill requests, have your pharmacy contact our office and allow 72 hours for refills to be completed.    Today you received the following chemotherapy and/or immunotherapy agents Velcade.      To help prevent nausea and vomiting after your treatment, we encourage you to take your nausea medication as directed.  BELOW ARE SYMPTOMS THAT SHOULD BE REPORTED IMMEDIATELY: *FEVER GREATER THAN 100.4 F (38 C) OR HIGHER *CHILLS OR SWEATING *NAUSEA AND VOMITING THAT IS NOT CONTROLLED WITH YOUR NAUSEA MEDICATION *UNUSUAL SHORTNESS OF BREATH *UNUSUAL BRUISING OR BLEEDING *URINARY PROBLEMS (pain or burning when urinating, or frequent urination) *BOWEL PROBLEMS (unusual diarrhea, constipation, pain near the anus) TENDERNESS IN MOUTH AND THROAT WITH OR WITHOUT PRESENCE OF ULCERS (sore throat, sores in mouth, or a toothache) UNUSUAL RASH, SWELLING OR PAIN  UNUSUAL VAGINAL DISCHARGE OR ITCHING   Items with * indicate a potential emergency and should be followed up as soon as possible or go to the Emergency Department if any problems should occur.  Please show the CHEMOTHERAPY ALERT CARD or IMMUNOTHERAPY ALERT CARD at check-in to the  Emergency Department and triage nurse. Should you have questions after your visit or need to cancel or reschedule your appointment, please contact [facility]  [phone] and follow the prompts.  Office hours are 8:00 a.m. to 4:30 p.m. Monday - Friday. Please note that voicemails left after 4:00 p.m. may not be returned until the following business day.  We are closed weekends and major holidays. You have access to a nurse at all times for urgent questions. Please call the main number to the clinic [phone] and follow the prompts.  For any non-urgent questions, you may also contact your provider using MyChart. We now offer e-Visits for anyone 28 and older to request care online for non-urgent symptoms. For details visit [email or web address].   Also download the MyChart app! Go to the app store, search "MyChart", open the app, select [facility], and log in with your MyChart username and password.  Due to Covid, a mask is required upon entering the hospital/clinic. If you do not have a mask, one will be given to you upon arrival. For doctor visits, patients may have 1 support person aged 7 or older with them. For treatment visits, patients cannot have anyone with them due to current Covid guidelines and our immunocompromised population.

## 2021-02-04 ENCOUNTER — Other Ambulatory Visit: Payer: Self-pay | Admitting: *Deleted

## 2021-02-04 DIAGNOSIS — C9002 Multiple myeloma in relapse: Secondary | ICD-10-CM

## 2021-02-04 LAB — IGG, IGA, IGM
IgA: 244 mg/dL (ref 90–386)
IgG (Immunoglobin G), Serum: 1362 mg/dL (ref 603–1613)
IgM (Immunoglobulin M), Srm: 22 mg/dL (ref 20–172)

## 2021-02-04 LAB — KAPPA/LAMBDA LIGHT CHAINS
Kappa free light chain: 48.7 mg/L — ABNORMAL HIGH (ref 3.3–19.4)
Kappa, lambda light chain ratio: 1.6 (ref 0.26–1.65)
Lambda free light chains: 30.4 mg/L — ABNORMAL HIGH (ref 5.7–26.3)

## 2021-02-04 MED ORDER — LENALIDOMIDE 10 MG PO CAPS
ORAL_CAPSULE | ORAL | 0 refills | Status: DC
Start: 2021-02-04 — End: 2021-03-07

## 2021-02-06 LAB — PROTEIN ELECTROPHORESIS, SERUM, WITH REFLEX
A/G Ratio: 1.4 (ref 0.7–1.7)
Albumin ELP: 4 g/dL (ref 2.9–4.4)
Alpha-1-Globulin: 0.1 g/dL (ref 0.0–0.4)
Alpha-2-Globulin: 0.5 g/dL (ref 0.4–1.0)
Beta Globulin: 0.9 g/dL (ref 0.7–1.3)
Gamma Globulin: 1.3 g/dL (ref 0.4–1.8)
Globulin, Total: 2.8 g/dL (ref 2.2–3.9)
Total Protein ELP: 6.8 g/dL (ref 6.0–8.5)

## 2021-02-20 ENCOUNTER — Other Ambulatory Visit: Payer: Self-pay

## 2021-02-20 ENCOUNTER — Inpatient Hospital Stay: Payer: Medicare Other | Attending: Hematology & Oncology

## 2021-02-20 ENCOUNTER — Inpatient Hospital Stay: Payer: Medicare Other

## 2021-02-20 VITALS — BP 138/80 | HR 54 | Temp 97.5°F | Resp 17

## 2021-02-20 DIAGNOSIS — Z5112 Encounter for antineoplastic immunotherapy: Secondary | ICD-10-CM | POA: Diagnosis not present

## 2021-02-20 DIAGNOSIS — C9001 Multiple myeloma in remission: Secondary | ICD-10-CM

## 2021-02-20 DIAGNOSIS — C9000 Multiple myeloma not having achieved remission: Secondary | ICD-10-CM

## 2021-02-20 DIAGNOSIS — Z79899 Other long term (current) drug therapy: Secondary | ICD-10-CM | POA: Insufficient documentation

## 2021-02-20 LAB — CBC WITH DIFFERENTIAL (CANCER CENTER ONLY)
Abs Immature Granulocytes: 0.01 10*3/uL (ref 0.00–0.07)
Basophils Absolute: 0 10*3/uL (ref 0.0–0.1)
Basophils Relative: 1 %
Eosinophils Absolute: 0 10*3/uL (ref 0.0–0.5)
Eosinophils Relative: 1 %
HCT: 35.5 % — ABNORMAL LOW (ref 39.0–52.0)
Hemoglobin: 12.9 g/dL — ABNORMAL LOW (ref 13.0–17.0)
Immature Granulocytes: 0 %
Lymphocytes Relative: 35 %
Lymphs Abs: 0.9 10*3/uL (ref 0.7–4.0)
MCH: 36.6 pg — ABNORMAL HIGH (ref 26.0–34.0)
MCHC: 36.3 g/dL — ABNORMAL HIGH (ref 30.0–36.0)
MCV: 100.9 fL — ABNORMAL HIGH (ref 80.0–100.0)
Monocytes Absolute: 0.3 10*3/uL (ref 0.1–1.0)
Monocytes Relative: 12 %
Neutro Abs: 1.3 10*3/uL — ABNORMAL LOW (ref 1.7–7.7)
Neutrophils Relative %: 51 %
Platelet Count: 140 10*3/uL — ABNORMAL LOW (ref 150–400)
RBC: 3.52 MIL/uL — ABNORMAL LOW (ref 4.22–5.81)
RDW: 13.1 % (ref 11.5–15.5)
WBC Count: 2.6 10*3/uL — ABNORMAL LOW (ref 4.0–10.5)
nRBC: 0 % (ref 0.0–0.2)

## 2021-02-20 LAB — CMP (CANCER CENTER ONLY)
ALT: 45 U/L — ABNORMAL HIGH (ref 0–44)
AST: 32 U/L (ref 15–41)
Albumin: 4.2 g/dL (ref 3.5–5.0)
Alkaline Phosphatase: 56 U/L (ref 38–126)
Anion gap: 7 (ref 5–15)
BUN: 19 mg/dL (ref 6–20)
CO2: 23 mmol/L (ref 22–32)
Calcium: 9.3 mg/dL (ref 8.9–10.3)
Chloride: 108 mmol/L (ref 98–111)
Creatinine: 1.45 mg/dL — ABNORMAL HIGH (ref 0.61–1.24)
GFR, Estimated: 57 mL/min — ABNORMAL LOW (ref >60)
Glucose, Bld: 98 mg/dL (ref 70–99)
Potassium: 4.6 mmol/L (ref 3.5–5.1)
Sodium: 138 mmol/L (ref 135–145)
Total Bilirubin: 0.8 mg/dL (ref 0.3–1.2)
Total Protein: 6.9 g/dL (ref 6.5–8.1)

## 2021-02-20 MED ORDER — BORTEZOMIB CHEMO SQ INJECTION 3.5 MG (2.5MG/ML)
3.0000 mg | Freq: Once | INTRAMUSCULAR | Status: AC
  Administered 2021-02-20: 3 mg via SUBCUTANEOUS
  Filled 2021-02-20: qty 1.2

## 2021-02-20 NOTE — Patient Instructions (Signed)
[facility] [place]  Discharge Instructions: Thank you for choosing [facility] to provide your oncology and hematology care.   If you have a lab appointment with the [facility], please go directly to the [facility] and check in at the registration area.  Wear comfortable clothing and clothing appropriate for easy access to any Portacath or PICC line.   We strive to give you quality time with your provider. You may need to reschedule your appointment if you arrive late (15 or more minutes).  Arriving late affects you and other patients whose appointments are after yours.  Also, if you miss three or more appointments without notifying the office, you may be dismissed from the clinic at the provider's discretion.      For prescription refill requests, have your pharmacy contact our office and allow 72 hours for refills to be completed.    Today you received the following chemotherapy and/or immunotherapy agents velcade   To help prevent nausea and vomiting after your treatment, we encourage you to take your nausea medication as directed.  BELOW ARE SYMPTOMS THAT SHOULD BE REPORTED IMMEDIATELY: *FEVER GREATER THAN 100.4 F (38 C) OR HIGHER *CHILLS OR SWEATING *NAUSEA AND VOMITING THAT IS NOT CONTROLLED WITH YOUR NAUSEA MEDICATION *UNUSUAL SHORTNESS OF BREATH *UNUSUAL BRUISING OR BLEEDING *URINARY PROBLEMS (pain or burning when urinating, or frequent urination) *BOWEL PROBLEMS (unusual diarrhea, constipation, pain near the anus) TENDERNESS IN MOUTH AND THROAT WITH OR WITHOUT PRESENCE OF ULCERS (sore throat, sores in mouth, or a toothache) UNUSUAL RASH, SWELLING OR PAIN  UNUSUAL VAGINAL DISCHARGE OR ITCHING   Items with * indicate a potential emergency and should be followed up as soon as possible or go to the Emergency Department if any problems should occur.  Please show the CHEMOTHERAPY ALERT CARD or IMMUNOTHERAPY ALERT CARD at check-in to the  Emergency Department and triage nurse. Should you have questions after your visit or need to cancel or reschedule your appointment, please contact [facility]  [phone] and follow the prompts.  Office hours are 8:00 a.m. to 4:30 p.m. Monday - Friday. Please note that voicemails left after 4:00 p.m. may not be returned until the following business day.  We are closed weekends and major holidays. You have access to a nurse at all times for urgent questions. Please call the main number to the clinic [phone] and follow the prompts.  For any non-urgent questions, you may also contact your provider using MyChart. We now offer e-Visits for anyone 12 and older to request care online for non-urgent symptoms. For details visit [email or web address].   Also download the MyChart app! Go to the app store, search "MyChart", open the app, select [facility], and log in with your MyChart username and password.  Due to Covid, a mask is required upon entering the hospital/clinic. If you do not have a mask, one will be given to you upon arrival. For doctor visits, patients may have 1 support person aged 104 or older with them. For treatment visits, patients cannot have anyone with them due to current Covid guidelines and our immunocompromised population.

## 2021-02-20 NOTE — Progress Notes (Signed)
Per Dr. Ennever, okay to treat today despite labs ?

## 2021-03-06 ENCOUNTER — Inpatient Hospital Stay: Payer: Medicare Other

## 2021-03-06 ENCOUNTER — Other Ambulatory Visit: Payer: Self-pay

## 2021-03-06 VITALS — BP 134/85 | HR 61 | Temp 97.5°F | Resp 17

## 2021-03-06 DIAGNOSIS — Z79899 Other long term (current) drug therapy: Secondary | ICD-10-CM | POA: Diagnosis not present

## 2021-03-06 DIAGNOSIS — C9000 Multiple myeloma not having achieved remission: Secondary | ICD-10-CM

## 2021-03-06 DIAGNOSIS — C9001 Multiple myeloma in remission: Secondary | ICD-10-CM

## 2021-03-06 DIAGNOSIS — Z5112 Encounter for antineoplastic immunotherapy: Secondary | ICD-10-CM | POA: Diagnosis not present

## 2021-03-06 LAB — CBC WITH DIFFERENTIAL (CANCER CENTER ONLY)
Abs Immature Granulocytes: 0.01 10*3/uL (ref 0.00–0.07)
Basophils Absolute: 0 10*3/uL (ref 0.0–0.1)
Basophils Relative: 1 %
Eosinophils Absolute: 0.1 10*3/uL (ref 0.0–0.5)
Eosinophils Relative: 2 %
HCT: 36.4 % — ABNORMAL LOW (ref 39.0–52.0)
Hemoglobin: 13 g/dL (ref 13.0–17.0)
Immature Granulocytes: 0 %
Lymphocytes Relative: 30 %
Lymphs Abs: 0.9 10*3/uL (ref 0.7–4.0)
MCH: 36.5 pg — ABNORMAL HIGH (ref 26.0–34.0)
MCHC: 35.7 g/dL (ref 30.0–36.0)
MCV: 102.2 fL — ABNORMAL HIGH (ref 80.0–100.0)
Monocytes Absolute: 0.4 10*3/uL (ref 0.1–1.0)
Monocytes Relative: 12 %
Neutro Abs: 1.7 10*3/uL (ref 1.7–7.7)
Neutrophils Relative %: 55 %
Platelet Count: 145 10*3/uL — ABNORMAL LOW (ref 150–400)
RBC: 3.56 MIL/uL — ABNORMAL LOW (ref 4.22–5.81)
RDW: 12.9 % (ref 11.5–15.5)
WBC Count: 3 10*3/uL — ABNORMAL LOW (ref 4.0–10.5)
nRBC: 0 % (ref 0.0–0.2)

## 2021-03-06 LAB — CMP (CANCER CENTER ONLY)
ALT: 66 U/L — ABNORMAL HIGH (ref 0–44)
AST: 42 U/L — ABNORMAL HIGH (ref 15–41)
Albumin: 4.3 g/dL (ref 3.5–5.0)
Alkaline Phosphatase: 56 U/L (ref 38–126)
Anion gap: 7 (ref 5–15)
BUN: 20 mg/dL (ref 6–20)
CO2: 25 mmol/L (ref 22–32)
Calcium: 9.1 mg/dL (ref 8.9–10.3)
Chloride: 105 mmol/L (ref 98–111)
Creatinine: 1.4 mg/dL — ABNORMAL HIGH (ref 0.61–1.24)
GFR, Estimated: 59 mL/min — ABNORMAL LOW (ref >60)
Glucose, Bld: 85 mg/dL (ref 70–99)
Potassium: 3.9 mmol/L (ref 3.5–5.1)
Sodium: 137 mmol/L (ref 135–145)
Total Bilirubin: 0.8 mg/dL (ref 0.3–1.2)
Total Protein: 7.1 g/dL (ref 6.5–8.1)

## 2021-03-06 MED ORDER — BORTEZOMIB CHEMO SQ INJECTION 3.5 MG (2.5MG/ML)
3.0000 mg | Freq: Once | INTRAMUSCULAR | Status: AC
  Administered 2021-03-06: 3 mg via SUBCUTANEOUS
  Filled 2021-03-06: qty 1.2

## 2021-03-06 NOTE — Patient Instructions (Signed)
[facility] [place]  Discharge Instructions: Thank you for choosing [facility] to provide your oncology and hematology care.   If you have a lab appointment with the [facility], please go directly to the [facility] and check in at the registration area.  Wear comfortable clothing and clothing appropriate for easy access to any Portacath or PICC line.   We strive to give you quality time with your provider. You may need to reschedule your appointment if you arrive late (15 or more minutes).  Arriving late affects you and other patients whose appointments are after yours.  Also, if you miss three or more appointments without notifying the office, you may be dismissed from the clinic at the provider's discretion.      For prescription refill requests, have your pharmacy contact our office and allow 72 hours for refills to be completed.    Today you received the following chemotherapy and/or immunotherapy agents velcade    To help prevent nausea and vomiting after your treatment, we encourage you to take your nausea medication as directed.  BELOW ARE SYMPTOMS THAT SHOULD BE REPORTED IMMEDIATELY: *FEVER GREATER THAN 100.4 F (38 C) OR HIGHER *CHILLS OR SWEATING *NAUSEA AND VOMITING THAT IS NOT CONTROLLED WITH YOUR NAUSEA MEDICATION *UNUSUAL SHORTNESS OF BREATH *UNUSUAL BRUISING OR BLEEDING *URINARY PROBLEMS (pain or burning when urinating, or frequent urination) *BOWEL PROBLEMS (unusual diarrhea, constipation, pain near the anus) TENDERNESS IN MOUTH AND THROAT WITH OR WITHOUT PRESENCE OF ULCERS (sore throat, sores in mouth, or a toothache) UNUSUAL RASH, SWELLING OR PAIN  UNUSUAL VAGINAL DISCHARGE OR ITCHING   Items with * indicate a potential emergency and should be followed up as soon as possible or go to the Emergency Department if any problems should occur.  Please show the CHEMOTHERAPY ALERT CARD or IMMUNOTHERAPY ALERT CARD at check-in to the  Emergency Department and triage nurse. Should you have questions after your visit or need to cancel or reschedule your appointment, please contact [facility]  [phone] and follow the prompts.  Office hours are 8:00 a.m. to 4:30 p.m. Monday - Friday. Please note that voicemails left after 4:00 p.m. may not be returned until the following business day.  We are closed weekends and major holidays. You have access to a nurse at all times for urgent questions. Please call the main number to the clinic [phone] and follow the prompts.  For any non-urgent questions, you may also contact your provider using MyChart. We now offer e-Visits for anyone 31 and older to request care online for non-urgent symptoms. For details visit [email or web address].   Also download the MyChart app! Go to the app store, search "MyChart", open the app, select [facility], and log in with your MyChart username and password.  Due to Covid, a mask is required upon entering the hospital/clinic. If you do not have a mask, one will be given to you upon arrival. For doctor visits, patients may have 1 support person aged 49 or older with them. For treatment visits, patients cannot have anyone with them due to current Covid guidelines and our immunocompromised population.

## 2021-03-07 ENCOUNTER — Other Ambulatory Visit: Payer: Self-pay | Admitting: Hematology & Oncology

## 2021-03-07 DIAGNOSIS — C9002 Multiple myeloma in relapse: Secondary | ICD-10-CM

## 2021-03-09 ENCOUNTER — Other Ambulatory Visit: Payer: Self-pay | Admitting: *Deleted

## 2021-03-09 DIAGNOSIS — C9002 Multiple myeloma in relapse: Secondary | ICD-10-CM

## 2021-03-09 MED ORDER — LENALIDOMIDE 10 MG PO CAPS: ORAL_CAPSULE | ORAL | 0 refills | Status: DC

## 2021-03-20 ENCOUNTER — Inpatient Hospital Stay: Payer: Medicare Other | Admitting: Family

## 2021-03-20 ENCOUNTER — Inpatient Hospital Stay: Payer: Medicare Other

## 2021-04-03 ENCOUNTER — Inpatient Hospital Stay: Payer: Medicare Other

## 2021-04-03 ENCOUNTER — Other Ambulatory Visit: Payer: Self-pay

## 2021-04-03 ENCOUNTER — Inpatient Hospital Stay: Payer: Medicare Other | Attending: Hematology & Oncology

## 2021-04-03 ENCOUNTER — Inpatient Hospital Stay (HOSPITAL_BASED_OUTPATIENT_CLINIC_OR_DEPARTMENT_OTHER): Payer: Medicare Other | Admitting: Family

## 2021-04-03 VITALS — BP 152/84 | HR 71 | Temp 98.0°F | Resp 17 | Wt 217.4 lb

## 2021-04-03 DIAGNOSIS — Z79899 Other long term (current) drug therapy: Secondary | ICD-10-CM | POA: Insufficient documentation

## 2021-04-03 DIAGNOSIS — C9001 Multiple myeloma in remission: Secondary | ICD-10-CM | POA: Diagnosis not present

## 2021-04-03 DIAGNOSIS — C9000 Multiple myeloma not having achieved remission: Secondary | ICD-10-CM

## 2021-04-03 DIAGNOSIS — Z5112 Encounter for antineoplastic immunotherapy: Secondary | ICD-10-CM | POA: Insufficient documentation

## 2021-04-03 LAB — CBC WITH DIFFERENTIAL (CANCER CENTER ONLY)
Abs Immature Granulocytes: 0 10*3/uL (ref 0.00–0.07)
Basophils Absolute: 0 10*3/uL (ref 0.0–0.1)
Basophils Relative: 0 %
Eosinophils Absolute: 0.1 10*3/uL (ref 0.0–0.5)
Eosinophils Relative: 2 %
HCT: 37.4 % — ABNORMAL LOW (ref 39.0–52.0)
Hemoglobin: 13.3 g/dL (ref 13.0–17.0)
Lymphocytes Relative: 36 %
Lymphs Abs: 0.9 10*3/uL (ref 0.7–4.0)
MCH: 36.7 pg — ABNORMAL HIGH (ref 26.0–34.0)
MCHC: 35.6 g/dL (ref 30.0–36.0)
MCV: 103.3 fL — ABNORMAL HIGH (ref 80.0–100.0)
Monocytes Absolute: 0.1 10*3/uL (ref 0.1–1.0)
Monocytes Relative: 3 %
Neutro Abs: 1.5 10*3/uL — ABNORMAL LOW (ref 1.7–7.7)
Neutrophils Relative %: 59 %
Platelet Count: 130 10*3/uL — ABNORMAL LOW (ref 150–400)
RBC: 3.62 MIL/uL — ABNORMAL LOW (ref 4.22–5.81)
RDW: 12.9 % (ref 11.5–15.5)
Smear Review: NORMAL
WBC Count: 2.5 10*3/uL — ABNORMAL LOW (ref 4.0–10.5)
nRBC: 0 % (ref 0.0–0.2)

## 2021-04-03 LAB — CMP (CANCER CENTER ONLY)
ALT: 46 U/L — ABNORMAL HIGH (ref 0–44)
AST: 29 U/L (ref 15–41)
Albumin: 4.2 g/dL (ref 3.5–5.0)
Alkaline Phosphatase: 55 U/L (ref 38–126)
Anion gap: 7 (ref 5–15)
BUN: 14 mg/dL (ref 6–20)
CO2: 27 mmol/L (ref 22–32)
Calcium: 9 mg/dL (ref 8.9–10.3)
Chloride: 105 mmol/L (ref 98–111)
Creatinine: 1.44 mg/dL — ABNORMAL HIGH (ref 0.61–1.24)
GFR, Estimated: 57 mL/min — ABNORMAL LOW (ref >60)
Glucose, Bld: 76 mg/dL (ref 70–99)
Potassium: 4.1 mmol/L (ref 3.5–5.1)
Sodium: 139 mmol/L (ref 135–145)
Total Bilirubin: 0.6 mg/dL (ref 0.3–1.2)
Total Protein: 7 g/dL (ref 6.5–8.1)

## 2021-04-03 LAB — LACTATE DEHYDROGENASE: LDH: 149 U/L (ref 98–192)

## 2021-04-03 MED ORDER — BORTEZOMIB CHEMO SQ INJECTION 3.5 MG (2.5MG/ML)
3.0000 mg | Freq: Once | INTRAMUSCULAR | Status: AC
  Administered 2021-04-03: 3 mg via SUBCUTANEOUS
  Filled 2021-04-03: qty 1.2

## 2021-04-03 NOTE — Patient Instructions (Signed)
[facility] Discharge Instructions for Patients Receiving Chemotherapy  Today you received the following chemotherapy agents Velcade To help prevent nausea and vomiting after your treatment, we encourage you to take your nausea medication as prescribed.   If you develop nausea and vomiting that is not controlled by your nausea medication, call the clinic.   BELOW ARE SYMPTOMS THAT SHOULD BE REPORTED IMMEDIATELY:  *FEVER GREATER THAN 100.5 F  *CHILLS WITH OR WITHOUT FEVER  NAUSEA AND VOMITING THAT IS NOT CONTROLLED WITH YOUR NAUSEA MEDICATION  *UNUSUAL SHORTNESS OF BREATH  *UNUSUAL BRUISING OR BLEEDING  TENDERNESS IN MOUTH AND THROAT WITH OR WITHOUT PRESENCE OF ULCERS  *URINARY PROBLEMS  *BOWEL PROBLEMS  UNUSUAL RASH Items with * indicate a potential emergency and should be followed up as soon as possible.  Feel free to call the clinic should you have any questions or concerns. The clinic phone number is [phone].  Please show the CHEMO ALERT CARD at check-in to the Emergency Department and triage nurse.   

## 2021-04-03 NOTE — Progress Notes (Signed)
Hematology and Oncology Follow Up Visit  Tyler Phillips [id number] [birth year] 55 y.o. 04/03/2021   Principle Diagnosis:  IgG Kappa myeloma - +4, +14, +17   Past Therapy: Status post autologous stem cell transplant on 05/22/2015   S/p cycle 6 of RVD Ninlaro 4 mg po q 2 week -- start on 01/02/2019 -- d/c on 02/10/2019   Current Therapy:        Velcade q 2wk dosing Revlimid 10mg  po q day (21/7)    Interim History:  [name] is here today for follow-up and treatment. He continues to do well.  M-spike was not detected at last visit. IgG level 1,362 mg/dL and Kappa 4.87 mg/dL.  He cracked a tooth flossing last night and has an appointment with his dentist for further assessment.  No fever, chills, n/v, cough, rash, dizziness, SOB, chest pain, palpitations, abdominal pain or changes in bowel or bladder habits.  No swelling or tenderness in his extremities.  He notes that the neuropathy in his left Phillips seems to be a little worse but still tolerable.  No falls or syncope.  He is eating well and hydrating properly. His weight is stable at 217 lbs.   ECOG Performance Status: 1 - Symptomatic but completely ambulatory  Medications:  Allergies as of 04/03/2021   No Known Allergies      Medication List        Accurate as of April 03, 2021  9:20 AM. If you have any questions, ask your nurse or doctor.          aspirin 325 MG tablet Take 325 mg by mouth daily.   bortezomib IV 3.5 MG injection Commonly known as: VELCADE Inject into the vein every 14 (fourteen) days.   cetirizine 10 MG tablet Commonly known as: ZYRTEC Take 10 mg by mouth daily.   lenalidomide 10 MG capsule Commonly known as: Revlimid TAKE 1 CAPSULE BY MOUTH  DAILY FOR 21 DAYS, THEN 7  DAYS OFF   LORazepam 1 MG tablet Commonly known as: ATIVAN Take 1 tablet (1 mg total) by mouth every 4 (four) hours as needed.   Pfizer-BioNTech COVID-19 Vacc 30 MCG/0.3ML injection Generic drug: COVID-19 mRNA  vaccine (Pfizer)   valACYclovir 500 MG tablet Commonly known as: VALTREX Take 500 mg by mouth 2 (two) times daily.   zolpidem 10 MG tablet Commonly known as: AMBIEN Take 1 tablet (10 mg total) by mouth at bedtime as needed for sleep.        Allergies: No Known Allergies  Past Medical History, Surgical history, Social history, and Family History were reviewed and updated.  Review of Systems: All other 10 point review of systems is negative.   Physical Exam:  weight is 217 lb 6.4 oz (98.6 kg). His oral temperature is 98 F (36.7 C). His blood pressure is 152/84 (abnormal) and his pulse is 71. His respiration is 17 and oxygen saturation is 98%.   Wt Readings from Last 3 Encounters:  04/03/21 217 lb 6.4 oz (98.6 kg)  02/03/21 211 lb 1.9 oz (95.8 kg)  12/17/20 209 lb 6.4 oz (95 kg)    Ocular: Sclerae unicteric, pupils equal, round and reactive to light Ear-nose-throat: Oropharynx clear, dentition fair Lymphatic: No cervical or supraclavicular adenopathy Lungs no rales or rhonchi, good excursion bilaterally Heart regular rate and rhythm, no murmur appreciated Abd soft, nontender, positive bowel sounds MSK no focal spinal tenderness, no joint edema Neuro: non-focal, well-oriented, appropriate affect Breasts: Deferred  Lab Results  Component Value  Date   WBC 2.5 (L) 04/03/2021   HGB 13.3 04/03/2021   HCT 37.4 (L) 04/03/2021   MCV 103.3 (H) 04/03/2021   PLT 130 (L) 04/03/2021   Lab Results  Component Value Date   FERRITIN 1,263 (H) 07/20/2014   IRON 125 07/20/2014   TIBC 198 (L) 07/20/2014   UIBC 73 (L) 07/20/2014   IRONPCTSAT 63 (H) 07/20/2014   Lab Results  Component Value Date   RETICCTPCT 0.8 07/20/2014   RBC 3.62 (L) 04/03/2021   Lab Results  Component Value Date   KPAFRELGTCHN 48.7 (H) 02/03/2021   LAMBDASER 30.4 (H) 02/03/2021   KAPLAMBRATIO 1.60 02/03/2021   Lab Results  Component Value Date   IGGSERUM 1,362 02/03/2021   IGA 244 02/03/2021    IGMSERUM 22 02/03/2021   Lab Results  Component Value Date   TOTALPROTELP 6.8 02/03/2021   ALBUMINELP 4.0 02/03/2021   A1GS 0.1 02/03/2021   A2GS 0.5 02/03/2021   BETS 0.9 02/03/2021   BETA2SER 0.3 03/28/2015   GAMS 1.3 02/03/2021   MSPIKE Not Observed 02/03/2021   SPEI Comment 12/17/2020     Chemistry      Component Value Date/Time   NA 137 03/06/2021 0815   NA 140 04/22/2017 1140   NA 138 09/03/2015 1042   K 3.9 03/06/2021 0815   K 4.0 04/22/2017 1140   K 4.3 09/03/2015 1042   CL 105 03/06/2021 0815   CL 105 04/22/2017 1140   CO2 25 03/06/2021 0815   CO2 24 04/22/2017 1140   CO2 25 09/03/2015 1042   BUN 20 03/06/2021 0815   BUN 16 04/22/2017 1140   BUN 21.7 09/03/2015 1042   CREATININE 1.40 (H) 03/06/2021 0815   CREATININE 1.6 (H) 04/22/2017 1140   CREATININE 1.2 09/03/2015 1042      Component Value Date/Time   CALCIUM 9.1 03/06/2021 0815   CALCIUM 8.5 04/22/2017 1140   CALCIUM 9.1 09/03/2015 1042   ALKPHOS 56 03/06/2021 0815   ALKPHOS 59 04/22/2017 1140   ALKPHOS 42 09/03/2015 1042   AST 42 (H) 03/06/2021 0815   AST 27 09/03/2015 1042   ALT 66 (H) 03/06/2021 0815   ALT 45 04/22/2017 1140   ALT 41 09/03/2015 1042   BILITOT 0.8 03/06/2021 0815   BILITOT 0.54 09/03/2015 1042       Impression and Plan: [name] is a very pleasant 55 yo caucasian gentleman with IgG kappa myeloma. He underwent induction chemotherapy with RVD followed by an autologous stem cell transplant at [facility] on February 2017.  We will continue to monitor his light chains closely.  He received his Velcade today as planned.  Injection every 2 weeks and follow-up in 6 weeks.  Lottie Dawson, NP 12/16/20229:20 AM

## 2021-04-04 LAB — IGG, IGA, IGM
IgA: 221 mg/dL (ref 90–386)
IgG (Immunoglobin G), Serum: 1296 mg/dL (ref 603–1613)
IgM (Immunoglobulin M), Srm: 22 mg/dL (ref 20–172)

## 2021-04-05 ENCOUNTER — Other Ambulatory Visit: Payer: Self-pay | Admitting: Hematology & Oncology

## 2021-04-05 DIAGNOSIS — C9002 Multiple myeloma in relapse: Secondary | ICD-10-CM

## 2021-04-06 ENCOUNTER — Encounter: Payer: Self-pay | Admitting: Hematology & Oncology

## 2021-04-06 LAB — KAPPA/LAMBDA LIGHT CHAINS
Kappa free light chain: 49.5 mg/L — ABNORMAL HIGH (ref 3.3–19.4)
Kappa, lambda light chain ratio: 1.5 (ref 0.26–1.65)
Lambda free light chains: 33 mg/L — ABNORMAL HIGH (ref 5.7–26.3)

## 2021-04-08 LAB — PROTEIN ELECTROPHORESIS, SERUM
A/G Ratio: 1.3 (ref 0.7–1.7)
Albumin ELP: 3.8 g/dL (ref 2.9–4.4)
Alpha-1-Globulin: 0.2 g/dL (ref 0.0–0.4)
Alpha-2-Globulin: 0.6 g/dL (ref 0.4–1.0)
Beta Globulin: 0.9 g/dL (ref 0.7–1.3)
Gamma Globulin: 1.3 g/dL (ref 0.4–1.8)
Globulin, Total: 2.9 g/dL (ref 2.2–3.9)
Total Protein ELP: 6.7 g/dL (ref 6.0–8.5)

## 2021-04-17 ENCOUNTER — Inpatient Hospital Stay: Payer: Medicare Other

## 2021-04-17 ENCOUNTER — Other Ambulatory Visit: Payer: Self-pay

## 2021-04-17 VITALS — BP 127/79 | HR 52 | Temp 97.8°F

## 2021-04-17 DIAGNOSIS — C9001 Multiple myeloma in remission: Secondary | ICD-10-CM

## 2021-04-17 DIAGNOSIS — C9000 Multiple myeloma not having achieved remission: Secondary | ICD-10-CM | POA: Diagnosis not present

## 2021-04-17 DIAGNOSIS — Z79899 Other long term (current) drug therapy: Secondary | ICD-10-CM | POA: Diagnosis not present

## 2021-04-17 DIAGNOSIS — Z5112 Encounter for antineoplastic immunotherapy: Secondary | ICD-10-CM | POA: Diagnosis not present

## 2021-04-17 LAB — CBC WITH DIFFERENTIAL (CANCER CENTER ONLY)
Abs Immature Granulocytes: 0 10*3/uL (ref 0.00–0.07)
Basophils Absolute: 0 10*3/uL (ref 0.0–0.1)
Basophils Relative: 1 %
Eosinophils Absolute: 0 10*3/uL (ref 0.0–0.5)
Eosinophils Relative: 0 %
HCT: 38.6 % — ABNORMAL LOW (ref 39.0–52.0)
Hemoglobin: 13.6 g/dL (ref 13.0–17.0)
Immature Granulocytes: 0 %
Lymphocytes Relative: 40 %
Lymphs Abs: 1.1 10*3/uL (ref 0.7–4.0)
MCH: 36.9 pg — ABNORMAL HIGH (ref 26.0–34.0)
MCHC: 35.2 g/dL (ref 30.0–36.0)
MCV: 104.6 fL — ABNORMAL HIGH (ref 80.0–100.0)
Monocytes Absolute: 0.3 10*3/uL (ref 0.1–1.0)
Monocytes Relative: 11 %
Neutro Abs: 1.3 10*3/uL — ABNORMAL LOW (ref 1.7–7.7)
Neutrophils Relative %: 48 %
Platelet Count: 118 10*3/uL — ABNORMAL LOW (ref 150–400)
RBC: 3.69 MIL/uL — ABNORMAL LOW (ref 4.22–5.81)
RDW: 13.2 % (ref 11.5–15.5)
Smear Review: NORMAL
WBC Count: 2.7 10*3/uL — ABNORMAL LOW (ref 4.0–10.5)
nRBC: 0 % (ref 0.0–0.2)

## 2021-04-17 LAB — CMP (CANCER CENTER ONLY)
ALT: 51 U/L — ABNORMAL HIGH (ref 0–44)
AST: 33 U/L (ref 15–41)
Albumin: 4.3 g/dL (ref 3.5–5.0)
Alkaline Phosphatase: 57 U/L (ref 38–126)
Anion gap: 6 (ref 5–15)
BUN: 19 mg/dL (ref 6–20)
CO2: 26 mmol/L (ref 22–32)
Calcium: 8.9 mg/dL (ref 8.9–10.3)
Chloride: 105 mmol/L (ref 98–111)
Creatinine: 1.31 mg/dL — ABNORMAL HIGH (ref 0.61–1.24)
GFR, Estimated: >60 mL/min (ref >60)
Glucose, Bld: 91 mg/dL (ref 70–99)
Potassium: 4.4 mmol/L (ref 3.5–5.1)
Sodium: 137 mmol/L (ref 135–145)
Total Bilirubin: 0.8 mg/dL (ref 0.3–1.2)
Total Protein: 7.1 g/dL (ref 6.5–8.1)

## 2021-04-17 MED ORDER — BORTEZOMIB CHEMO SQ INJECTION 3.5 MG (2.5MG/ML)
3.0000 mg | Freq: Once | INTRAMUSCULAR | Status: AC
  Administered 2021-04-17: 10:00:00 3 mg via SUBCUTANEOUS
  Filled 2021-04-17: qty 1.2

## 2021-04-17 NOTE — Progress Notes (Signed)
Ok to treat today per Dr. [name].

## 2021-04-29 ENCOUNTER — Other Ambulatory Visit: Payer: Self-pay | Admitting: *Deleted

## 2021-04-29 DIAGNOSIS — C9002 Multiple myeloma in relapse: Secondary | ICD-10-CM

## 2021-04-29 MED ORDER — LENALIDOMIDE 10 MG PO CAPS: ORAL_CAPSULE | ORAL | 0 refills | Status: DC

## 2021-05-01 ENCOUNTER — Inpatient Hospital Stay: Payer: Medicare Other

## 2021-05-01 ENCOUNTER — Other Ambulatory Visit: Payer: Self-pay

## 2021-05-01 ENCOUNTER — Inpatient Hospital Stay: Payer: Medicare Other | Attending: Hematology & Oncology

## 2021-05-01 VITALS — BP 126/79 | HR 59 | Temp 98.2°F | Resp 17

## 2021-05-01 DIAGNOSIS — Z79899 Other long term (current) drug therapy: Secondary | ICD-10-CM | POA: Insufficient documentation

## 2021-05-01 DIAGNOSIS — Z5112 Encounter for antineoplastic immunotherapy: Secondary | ICD-10-CM | POA: Diagnosis not present

## 2021-05-01 DIAGNOSIS — C9000 Multiple myeloma not having achieved remission: Secondary | ICD-10-CM | POA: Insufficient documentation

## 2021-05-01 DIAGNOSIS — C9001 Multiple myeloma in remission: Secondary | ICD-10-CM

## 2021-05-01 LAB — CBC WITH DIFFERENTIAL (CANCER CENTER ONLY)
Abs Immature Granulocytes: 0 10*3/uL (ref 0.00–0.07)
Basophils Absolute: 0 10*3/uL (ref 0.0–0.1)
Basophils Relative: 0 %
Eosinophils Absolute: 0.1 10*3/uL (ref 0.0–0.5)
Eosinophils Relative: 3 %
HCT: 38.3 % — ABNORMAL LOW (ref 39.0–52.0)
Hemoglobin: 13.5 g/dL (ref 13.0–17.0)
Immature Granulocytes: 0 %
Lymphocytes Relative: 34 %
Lymphs Abs: 1 10*3/uL (ref 0.7–4.0)
MCH: 36.3 pg — ABNORMAL HIGH (ref 26.0–34.0)
MCHC: 35.2 g/dL (ref 30.0–36.0)
MCV: 103 fL — ABNORMAL HIGH (ref 80.0–100.0)
Monocytes Absolute: 0.3 10*3/uL (ref 0.1–1.0)
Monocytes Relative: 11 %
Neutro Abs: 1.5 10*3/uL — ABNORMAL LOW (ref 1.7–7.7)
Neutrophils Relative %: 52 %
Platelet Count: 141 10*3/uL — ABNORMAL LOW (ref 150–400)
RBC: 3.72 MIL/uL — ABNORMAL LOW (ref 4.22–5.81)
RDW: 12.9 % (ref 11.5–15.5)
Smear Review: NORMAL
WBC Count: 2.9 10*3/uL — ABNORMAL LOW (ref 4.0–10.5)
nRBC: 0 % (ref 0.0–0.2)

## 2021-05-01 LAB — CMP (CANCER CENTER ONLY)
ALT: 54 U/L — ABNORMAL HIGH (ref 0–44)
AST: 30 U/L (ref 15–41)
Albumin: 4.4 g/dL (ref 3.5–5.0)
Alkaline Phosphatase: 57 U/L (ref 38–126)
Anion gap: 6 (ref 5–15)
BUN: 15 mg/dL (ref 6–20)
CO2: 27 mmol/L (ref 22–32)
Calcium: 9 mg/dL (ref 8.9–10.3)
Chloride: 105 mmol/L (ref 98–111)
Creatinine: 1.5 mg/dL — ABNORMAL HIGH (ref 0.61–1.24)
GFR, Estimated: 54 mL/min — ABNORMAL LOW (ref >60)
Glucose, Bld: 113 mg/dL — ABNORMAL HIGH (ref 70–99)
Potassium: 4.3 mmol/L (ref 3.5–5.1)
Sodium: 138 mmol/L (ref 135–145)
Total Bilirubin: 0.9 mg/dL (ref 0.3–1.2)
Total Protein: 6.7 g/dL (ref 6.5–8.1)

## 2021-05-01 MED ORDER — BORTEZOMIB CHEMO SQ INJECTION 3.5 MG (2.5MG/ML)
3.0000 mg | Freq: Once | INTRAMUSCULAR | Status: AC
  Administered 2021-05-01: 3 mg via SUBCUTANEOUS
  Filled 2021-05-01: qty 1.2

## 2021-05-01 NOTE — Patient Instructions (Signed)
Bortezomib injection What is this medication? BORTEZOMIB (bor TEZ oh mib) targets proteins in cancer cells and stops thecancer cells from growing. It treats multiple myeloma and mantle cell lymphoma. This medicine may be used for other purposes; ask your health care provider orpharmacist if you have questions. COMMON BRAND NAME(S): Velcade What should I tell my care team before I take this medication? They need to know if you have any of these conditions: dehydration diabetes (high blood sugar) heart disease liver disease tingling of the fingers or toes or other nerve disorder an unusual or allergic reaction to bortezomib, mannitol, boron, other medicines, foods, dyes, or preservatives pregnant or trying to get pregnant breast-feeding How should I use this medication? This medicine is injected into a vein or under the skin. It is given by ahealth care provider in a hospital or clinic setting. Talk to your health care provider about the use of this medicine in children.Special care may be needed. Overdosage: If you think you have taken too much of this medicine contact apoison control center or emergency room at once. NOTE: This medicine is only for you. Do not share this medicine with others. What if I miss a dose? Keep appointments for follow-up doses. It is important not to miss your dose.Call your health care provider if you are unable to keep an appointment. What may interact with this medication? This medicine may interact with the following medications: ketoconazole rifampin This list may not describe all possible interactions. Give your health care provider a list of all the medicines, herbs, non-prescription drugs, or dietary supplements you use. Also tell them if you smoke, drink alcohol, or use illegaldrugs. Some items may interact with your medicine. What should I watch for while using this medication? Your condition will be monitored carefully while you are receiving  thismedicine. You may need blood work done while you are taking this medicine. You may get drowsy or dizzy. Do not drive, use machinery, or do anything that needs mental alertness until you know how this medicine affects you. Do not stand up or sit up quickly, especially if you are an older patient. Thisreduces the risk of dizzy or fainting spells This medicine may increase your risk of getting an infection. Call your health care provider for advice if you get a fever, chills, sore throat, or other symptoms of a cold or flu. Do not treat yourself. Try to avoid being aroundpeople who are sick. Check with your health care provider if you have severe diarrhea, nausea, and vomiting, or if you sweat a lot. The loss of too much body fluid may make itdangerous for you to take this medicine. Do not become pregnant while taking this medicine or for 7 months after stopping it. Women should inform their health care provider if they wish to become pregnant or think they might be pregnant. Men should not father a child while taking this medicine and for 4 months after stopping it. There is a potential for serious harm to an unborn child. Talk to your health care provider for more information. Do not breast-feed an infant while taking thismedicine or for 2 months after stopping it. This medicine may make it more difficult to get pregnant or father a child.Talk to your health care provider if you are concerned about your fertility. What side effects may I notice from receiving this medication? Side effects that you should report to your doctor or health care professionalas soon as possible: allergic reactions (skin rash; itching or hives; swelling   care professional as soon as possible: ?allergic reactions (skin rash; itching or hives; swelling of the face, lips, or tongue) ?bleeding (bloody or black, tarry stools; red or dark brown urine; spitting up blood or brown material that looks like coffee grounds; red spots on the skin; unusual bruising or bleeding from the eye, gums, or nose) ?blurred vision or changes  in vision ?confusion ?constipation ?headache ?heart failure (trouble breathing; fast, irregular heartbeat; sudden weight gain; swelling of the ankles, feet, hands) ?infection (fever, chills, cough, sore throat, pain or trouble passing urine) ?lack or loss of appetite ?liver injury (dark yellow or brown urine; general ill feeling or flu-like symptoms; loss of appetite, right upper belly pain; yellowing of the eyes or skin) ?low blood pressure (dizziness; feeling faint or lightheaded, falls; unusually weak or tired) ?muscle cramps ?pain, redness, or irritation at site where injected ?pain, tingling, numbness in the hands or feet ?seizures ?trouble breathing ?unusual bruising or bleeding ?Side effects that usually do not require medical attention (report to your doctor or health care professional if they continue or are bothersome): ?diarrhea ?nausea ?stomach pain ?trouble sleeping ?vomiting ?This list may not describe all possible side effects. Call your doctor for medical advice about side effects. You may report side effects to FDA at 1-800-FDA-1088. ?Where should I keep my medication? ?This medicine is given in a hospital or clinic. It will not be stored at home. ?NOTE: This sheet is a summary. It may not cover all possible information. If you have questions about this medicine, talk to your doctor, pharmacist, or health care provider. ?? 2022 Elsevier/Gold Standard (2020-03-27 00:00:00) ?

## 2021-05-15 ENCOUNTER — Ambulatory Visit: Payer: Medicare Other | Admitting: Family

## 2021-05-15 ENCOUNTER — Other Ambulatory Visit: Payer: Medicare Other

## 2021-05-15 ENCOUNTER — Inpatient Hospital Stay: Payer: Medicare Other

## 2021-05-22 ENCOUNTER — Other Ambulatory Visit: Payer: Self-pay

## 2021-05-22 ENCOUNTER — Inpatient Hospital Stay: Payer: Medicare Other

## 2021-05-22 ENCOUNTER — Encounter: Payer: Self-pay | Admitting: Family

## 2021-05-22 ENCOUNTER — Inpatient Hospital Stay: Payer: Medicare Other | Attending: Hematology & Oncology

## 2021-05-22 ENCOUNTER — Inpatient Hospital Stay (HOSPITAL_BASED_OUTPATIENT_CLINIC_OR_DEPARTMENT_OTHER): Payer: Medicare Other | Admitting: Family

## 2021-05-22 VITALS — BP 158/91 | HR 63 | Temp 98.3°F | Resp 18 | Ht 73.0 in | Wt 218.0 lb

## 2021-05-22 DIAGNOSIS — Z5112 Encounter for antineoplastic immunotherapy: Secondary | ICD-10-CM | POA: Diagnosis not present

## 2021-05-22 DIAGNOSIS — C9001 Multiple myeloma in remission: Secondary | ICD-10-CM

## 2021-05-22 DIAGNOSIS — Z79899 Other long term (current) drug therapy: Secondary | ICD-10-CM | POA: Diagnosis not present

## 2021-05-22 DIAGNOSIS — C9000 Multiple myeloma not having achieved remission: Secondary | ICD-10-CM | POA: Diagnosis not present

## 2021-05-22 LAB — CBC WITH DIFFERENTIAL (CANCER CENTER ONLY)
Abs Immature Granulocytes: 0.01 10*3/uL (ref 0.00–0.07)
Basophils Absolute: 0 10*3/uL (ref 0.0–0.1)
Basophils Relative: 1 %
Eosinophils Absolute: 0.1 10*3/uL (ref 0.0–0.5)
Eosinophils Relative: 2 %
HCT: 38 % — ABNORMAL LOW (ref 39.0–52.0)
Hemoglobin: 13.4 g/dL (ref 13.0–17.0)
Immature Granulocytes: 0 %
Lymphocytes Relative: 38 %
Lymphs Abs: 1 10*3/uL (ref 0.7–4.0)
MCH: 36.5 pg — ABNORMAL HIGH (ref 26.0–34.0)
MCHC: 35.3 g/dL (ref 30.0–36.0)
MCV: 103.5 fL — ABNORMAL HIGH (ref 80.0–100.0)
Monocytes Absolute: 0.2 10*3/uL (ref 0.1–1.0)
Monocytes Relative: 8 %
Neutro Abs: 1.4 10*3/uL — ABNORMAL LOW (ref 1.7–7.7)
Neutrophils Relative %: 51 %
Platelet Count: 137 10*3/uL — ABNORMAL LOW (ref 150–400)
RBC: 3.67 MIL/uL — ABNORMAL LOW (ref 4.22–5.81)
RDW: 12.9 % (ref 11.5–15.5)
WBC Count: 2.7 10*3/uL — ABNORMAL LOW (ref 4.0–10.5)
nRBC: 0 % (ref 0.0–0.2)

## 2021-05-22 LAB — CMP (CANCER CENTER ONLY)
ALT: 43 U/L (ref 0–44)
AST: 24 U/L (ref 15–41)
Albumin: 4.2 g/dL (ref 3.5–5.0)
Alkaline Phosphatase: 58 U/L (ref 38–126)
Anion gap: 6 (ref 5–15)
BUN: 14 mg/dL (ref 6–20)
CO2: 28 mmol/L (ref 22–32)
Calcium: 8.9 mg/dL (ref 8.9–10.3)
Chloride: 105 mmol/L (ref 98–111)
Creatinine: 1.39 mg/dL — ABNORMAL HIGH (ref 0.61–1.24)
GFR, Estimated: 59 mL/min — ABNORMAL LOW (ref >60)
Glucose, Bld: 60 mg/dL — ABNORMAL LOW (ref 70–99)
Potassium: 3.9 mmol/L (ref 3.5–5.1)
Sodium: 139 mmol/L (ref 135–145)
Total Bilirubin: 0.8 mg/dL (ref 0.3–1.2)
Total Protein: 6.8 g/dL (ref 6.5–8.1)

## 2021-05-22 LAB — LACTATE DEHYDROGENASE: LDH: 129 U/L (ref 98–192)

## 2021-05-22 MED ORDER — BORTEZOMIB CHEMO SQ INJECTION 3.5 MG (2.5MG/ML)
3.0000 mg | Freq: Once | INTRAMUSCULAR | Status: AC
  Administered 2021-05-22: 3 mg via SUBCUTANEOUS
  Filled 2021-05-22: qty 1.2

## 2021-05-22 NOTE — Progress Notes (Signed)
Hematology and Oncology Follow Up Visit  Tyler Phillips [id number] 1965/11/26 56 y.o. 05/22/2021   Principle Diagnosis:  IgG Kappa myeloma - +4, +14, +17   Past Therapy: Status post autologous stem cell transplant on 05/22/2015   S/p cycle 6 of RVD Ninlaro 4 mg po q 2 week -- start on 01/02/2019 -- d/c on 02/10/2019   Current Therapy:        Velcade q 2wk dosing Revlimid 10mg  po q day (21/7)    Interim History:  [name] is here today for follow-up and treatment. He is doing well but notes that he will have random bouts of diarrhea  that last up to 4 days. He has not associated these with his Revlimid cycle or food.  He will let us know if he needs a formal referral to GI.  He rarely notes blood in the stool with constipation.  No other blood loss noted. No bruising or petechiae.  No M-spike detected last month. IgG level was 1,350 mg/dL and kappa light chains 4.34 mg/dL No fever, chills, n/v, cough, rash, dizziness, SOB, chest pain, palpitations, abdominal pain or changes in bladder habits.  No swelling, tenderness in his extremities at this time.  The neuropathy in his toes is unchanged from baseline.  He has maintained a good appetite and is staying well hydrated. His weight is stable at 218 lbs.  He continues to exercise regularly.   ECOG Performance Status: 1 - Symptomatic but completely ambulatory  Medications:  Allergies as of 05/22/2021   No Known Allergies      Medication List        Accurate as of May 22, 2021  9:14 AM. If you have any questions, ask your nurse or doctor.          aspirin 325 MG tablet Take 325 mg by mouth daily.   bortezomib IV 3.5 MG injection Commonly known as: VELCADE Inject into the vein every 14 (fourteen) days.   cetirizine 10 MG tablet Commonly known as: ZYRTEC Take 10 mg by mouth daily.   lenalidomide 10 MG capsule Commonly known as: Revlimid TAKE 1 CAPSULE BY MOUTH DAILY  FOR 21 DAYS, THEN 7 DAYS OFF [id number]    LORazepam 1 MG tablet Commonly known as: ATIVAN Take 1 tablet (1 mg total) by mouth every 4 (four) hours as needed.   Pfizer-BioNTech COVID-19 Vacc 30 MCG/0.3ML injection Generic drug: COVID-19 mRNA vaccine (Pfizer)   valACYclovir 500 MG tablet Commonly known as: VALTREX Take 500 mg by mouth 2 (two) times daily.   zolpidem 10 MG tablet Commonly known as: AMBIEN Take 1 tablet (10 mg total) by mouth at bedtime as needed for sleep.        Allergies: No Known Allergies  Past Medical History, Surgical history, Social history, and Family History were reviewed and updated.  Review of Systems: All other 10 point review of systems is negative.   Physical Exam:  height is 6\' 1"  (1.854 m) and weight is 218 lb (98.9 kg). His oral temperature is 98.3 F (36.8 C). His blood pressure is 158/91 (abnormal) and his pulse is 63. His respiration is 18 and oxygen saturation is 100%.   Wt Readings from Last 3 Encounters:  05/22/21 218 lb (98.9 kg)  04/03/21 217 lb 6.4 oz (98.6 kg)  02/03/21 211 lb 1.9 oz (95.8 kg)    Ocular: Sclerae unicteric, pupils equal, round and reactive to light Ear-nose-throat: Oropharynx clear, dentition fair Lymphatic: No cervical or supraclavicular adenopathy Lungs  no rales or rhonchi, good excursion bilaterally Heart regular rate and rhythm, no murmur appreciated Abd soft, nontender, positive bowel sounds MSK no focal spinal tenderness, no joint edema Neuro: non-focal, well-oriented, appropriate affect Breasts: Deferred   Lab Results  Component Value Date   WBC 2.7 (L) 05/22/2021   HGB 13.4 05/22/2021   HCT 38.0 (L) 05/22/2021   MCV 103.5 (H) 05/22/2021   PLT 137 (L) 05/22/2021   Lab Results  Component Value Date   FERRITIN 1,263 (H) 07/20/2014   IRON 125 07/20/2014   TIBC 198 (L) 07/20/2014   UIBC 73 (L) 07/20/2014   IRONPCTSAT 63 (H) 07/20/2014   Lab Results  Component Value Date   RETICCTPCT 0.8 07/20/2014   RBC 3.67 (L) 05/22/2021   Lab  Results  Component Value Date   KPAFRELGTCHN 49.5 (H) 04/03/2021   LAMBDASER 33.0 (H) 04/03/2021   KAPLAMBRATIO 1.50 04/03/2021   Lab Results  Component Value Date   IGGSERUM 1,296 04/03/2021   IGA 221 04/03/2021   IGMSERUM 22 04/03/2021   Lab Results  Component Value Date   TOTALPROTELP 6.7 04/03/2021   ALBUMINELP 3.8 04/03/2021   A1GS 0.2 04/03/2021   A2GS 0.6 04/03/2021   BETS 0.9 04/03/2021   BETA2SER 0.3 03/28/2015   GAMS 1.3 04/03/2021   MSPIKE Not Observed 04/03/2021   SPEI Comment 04/03/2021     Chemistry      Component Value Date/Time   NA 138 05/01/2021 0806   NA 140 04/22/2017 1140   NA 138 09/03/2015 1042   K 4.3 05/01/2021 0806   K 4.0 04/22/2017 1140   K 4.3 09/03/2015 1042   CL 105 05/01/2021 0806   CL 105 04/22/2017 1140   CO2 27 05/01/2021 0806   CO2 24 04/22/2017 1140   CO2 25 09/03/2015 1042   BUN 15 05/01/2021 0806   BUN 16 04/22/2017 1140   BUN 21.7 09/03/2015 1042   CREATININE 1.50 (H) 05/01/2021 0806   CREATININE 1.6 (H) 04/22/2017 1140   CREATININE 1.2 09/03/2015 1042      Component Value Date/Time   CALCIUM 9.0 05/01/2021 0806   CALCIUM 8.5 04/22/2017 1140   CALCIUM 9.1 09/03/2015 1042   ALKPHOS 57 05/01/2021 0806   ALKPHOS 59 04/22/2017 1140   ALKPHOS 42 09/03/2015 1042   AST 30 05/01/2021 0806   AST 27 09/03/2015 1042   ALT 54 (H) 05/01/2021 0806   ALT 45 04/22/2017 1140   ALT 41 09/03/2015 1042   BILITOT 0.9 05/01/2021 0806   BILITOT 0.54 09/03/2015 1042       Impression and Plan: [name] is a very pleasant 56 yo caucasian gentleman with IgG kappa myeloma. He underwent induction chemotherapy with RVD followed by an autologous stem cell transplant at [facility] on February 2017.  Light chains pending.  We will proceed with Velcade today as planned.  Lab and injection every 2 weeks and follow-up in 6 weeks.   Tyler Dawson, NP 2/3/20239:14 AM

## 2021-05-22 NOTE — Addendum Note (Signed)
Addended by: Burney Gauze R on: 05/22/2021 10:18 AM   Modules accepted: Orders

## 2021-05-22 NOTE — Patient Instructions (Signed)
Bortezomib injection What is this medication? BORTEZOMIB (bor TEZ oh mib) targets proteins in cancer cells and stops thecancer cells from growing. It treats multiple myeloma and mantle cell lymphoma. This medicine may be used for other purposes; ask your health care provider orpharmacist if you have questions. COMMON BRAND NAME(S): Velcade What should I tell my care team before I take this medication? They need to know if you have any of these conditions: dehydration diabetes (high blood sugar) heart disease liver disease tingling of the fingers or toes or other nerve disorder an unusual or allergic reaction to bortezomib, mannitol, boron, other medicines, foods, dyes, or preservatives pregnant or trying to get pregnant breast-feeding How should I use this medication? This medicine is injected into a vein or under the skin. It is given by ahealth care provider in a hospital or clinic setting. Talk to your health care provider about the use of this medicine in children.Special care may be needed. Overdosage: If you think you have taken too much of this medicine contact apoison control center or emergency room at once. NOTE: This medicine is only for you. Do not share this medicine with others. What if I miss a dose? Keep appointments for follow-up doses. It is important not to miss your dose.Call your health care provider if you are unable to keep an appointment. What may interact with this medication? This medicine may interact with the following medications: ketoconazole rifampin This list may not describe all possible interactions. Give your health care provider a list of all the medicines, herbs, non-prescription drugs, or dietary supplements you use. Also tell them if you smoke, drink alcohol, or use illegaldrugs. Some items may interact with your medicine. What should I watch for while using this medication? Your condition will be monitored carefully while you are receiving  thismedicine. You may need blood work done while you are taking this medicine. You may get drowsy or dizzy. Do not drive, use machinery, or do anything that needs mental alertness until you know how this medicine affects you. Do not stand up or sit up quickly, especially if you are an older patient. Thisreduces the risk of dizzy or fainting spells This medicine may increase your risk of getting an infection. Call your health care provider for advice if you get a fever, chills, sore throat, or other symptoms of a cold or flu. Do not treat yourself. Try to avoid being aroundpeople who are sick. Check with your health care provider if you have severe diarrhea, nausea, and vomiting, or if you sweat a lot. The loss of too much body fluid may make itdangerous for you to take this medicine. Do not become pregnant while taking this medicine or for 7 months after stopping it. Women should inform their health care provider if they wish to become pregnant or think they might be pregnant. Men should not father a child while taking this medicine and for 4 months after stopping it. There is a potential for serious harm to an unborn child. Talk to your health care provider for more information. Do not breast-feed an infant while taking thismedicine or for 2 months after stopping it. This medicine may make it more difficult to get pregnant or father a child.Talk to your health care provider if you are concerned about your fertility. What side effects may I notice from receiving this medication? Side effects that you should report to your doctor or health care professionalas soon as possible: allergic reactions (skin rash; itching or hives; swelling   care professional as soon as possible: ?allergic reactions (skin rash; itching or hives; swelling of the face, lips, or tongue) ?bleeding (bloody or black, tarry stools; red or dark brown urine; spitting up blood or brown material that looks like coffee grounds; red spots on the skin; unusual bruising or bleeding from the eye, gums, or nose) ?blurred vision or changes  in vision ?confusion ?constipation ?headache ?heart failure (trouble breathing; fast, irregular heartbeat; sudden weight gain; swelling of the ankles, feet, hands) ?infection (fever, chills, cough, sore throat, pain or trouble passing urine) ?lack or loss of appetite ?liver injury (dark yellow or brown urine; general ill feeling or flu-like symptoms; loss of appetite, right upper belly pain; yellowing of the eyes or skin) ?low blood pressure (dizziness; feeling faint or lightheaded, falls; unusually weak or tired) ?muscle cramps ?pain, redness, or irritation at site where injected ?pain, tingling, numbness in the hands or feet ?seizures ?trouble breathing ?unusual bruising or bleeding ?Side effects that usually do not require medical attention (report to your doctor or health care professional if they continue or are bothersome): ?diarrhea ?nausea ?stomach pain ?trouble sleeping ?vomiting ?This list may not describe all possible side effects. Call your doctor for medical advice about side effects. You may report side effects to FDA at 1-800-FDA-1088. ?Where should I keep my medication? ?This medicine is given in a hospital or clinic. It will not be stored at home. ?NOTE: This sheet is a summary. It may not cover all possible information. If you have questions about this medicine, talk to your doctor, pharmacist, or health care provider. ?? 2022 Elsevier/Gold Standard (2020-03-27 00:00:00) ?

## 2021-05-23 LAB — IGG, IGA, IGM
IgA: 208 mg/dL (ref 90–386)
IgG (Immunoglobin G), Serum: 1303 mg/dL (ref 603–1613)
IgM (Immunoglobulin M), Srm: 19 mg/dL — ABNORMAL LOW (ref 20–172)

## 2021-05-25 DIAGNOSIS — Z9481 Bone marrow transplant status: Secondary | ICD-10-CM | POA: Diagnosis not present

## 2021-05-25 DIAGNOSIS — C9001 Multiple myeloma in remission: Secondary | ICD-10-CM | POA: Diagnosis not present

## 2021-05-25 LAB — KAPPA/LAMBDA LIGHT CHAINS
Kappa free light chain: 45.8 mg/L — ABNORMAL HIGH (ref 3.3–19.4)
Kappa, lambda light chain ratio: 1.4 (ref 0.26–1.65)
Lambda free light chains: 32.6 mg/L — ABNORMAL HIGH (ref 5.7–26.3)

## 2021-05-26 ENCOUNTER — Encounter: Payer: Self-pay | Admitting: Hematology & Oncology

## 2021-05-26 LAB — PROTEIN ELECTROPHORESIS, SERUM
A/G Ratio: 1.4 (ref 0.7–1.7)
Albumin ELP: 4 g/dL (ref 2.9–4.4)
Alpha-1-Globulin: 0.1 g/dL (ref 0.0–0.4)
Alpha-2-Globulin: 0.6 g/dL (ref 0.4–1.0)
Beta Globulin: 0.8 g/dL (ref 0.7–1.3)
Gamma Globulin: 1.3 g/dL (ref 0.4–1.8)
Globulin, Total: 2.9 g/dL (ref 2.2–3.9)
Total Protein ELP: 6.9 g/dL (ref 6.0–8.5)

## 2021-05-31 ENCOUNTER — Other Ambulatory Visit: Payer: Self-pay | Admitting: Hematology & Oncology

## 2021-05-31 DIAGNOSIS — C9002 Multiple myeloma in relapse: Secondary | ICD-10-CM

## 2021-06-01 ENCOUNTER — Encounter: Payer: Self-pay | Admitting: Hematology & Oncology

## 2021-06-01 ENCOUNTER — Other Ambulatory Visit: Payer: Self-pay | Admitting: *Deleted

## 2021-06-01 DIAGNOSIS — C9002 Multiple myeloma in relapse: Secondary | ICD-10-CM

## 2021-06-01 MED ORDER — LENALIDOMIDE 10 MG PO CAPS: ORAL_CAPSULE | ORAL | 0 refills | Status: DC

## 2021-06-05 ENCOUNTER — Other Ambulatory Visit: Payer: Self-pay

## 2021-06-05 ENCOUNTER — Encounter: Payer: Self-pay | Admitting: *Deleted

## 2021-06-05 ENCOUNTER — Inpatient Hospital Stay: Payer: Medicare Other

## 2021-06-05 VITALS — BP 154/84 | HR 59 | Temp 98.1°F | Resp 18

## 2021-06-05 DIAGNOSIS — C9001 Multiple myeloma in remission: Secondary | ICD-10-CM

## 2021-06-05 DIAGNOSIS — C9000 Multiple myeloma not having achieved remission: Secondary | ICD-10-CM | POA: Diagnosis not present

## 2021-06-05 DIAGNOSIS — Z5112 Encounter for antineoplastic immunotherapy: Secondary | ICD-10-CM | POA: Diagnosis not present

## 2021-06-05 DIAGNOSIS — Z79899 Other long term (current) drug therapy: Secondary | ICD-10-CM | POA: Diagnosis not present

## 2021-06-05 LAB — CMP (CANCER CENTER ONLY)
ALT: 62 U/L — ABNORMAL HIGH (ref 0–44)
AST: 35 U/L (ref 15–41)
Albumin: 3.9 g/dL (ref 3.5–5.0)
Alkaline Phosphatase: 57 U/L (ref 38–126)
Anion gap: 7 (ref 5–15)
BUN: 13 mg/dL (ref 6–20)
CO2: 25 mmol/L (ref 22–32)
Calcium: 8.5 mg/dL — ABNORMAL LOW (ref 8.9–10.3)
Chloride: 105 mmol/L (ref 98–111)
Creatinine: 1.36 mg/dL — ABNORMAL HIGH (ref 0.61–1.24)
GFR, Estimated: >60 mL/min (ref >60)
Glucose, Bld: 108 mg/dL — ABNORMAL HIGH (ref 70–99)
Potassium: 4.2 mmol/L (ref 3.5–5.1)
Sodium: 137 mmol/L (ref 135–145)
Total Bilirubin: 0.7 mg/dL (ref 0.3–1.2)
Total Protein: 6.8 g/dL (ref 6.5–8.1)

## 2021-06-05 LAB — CBC WITH DIFFERENTIAL (CANCER CENTER ONLY)
Band Neutrophils: 0 %
Basophils Absolute: 0 10*3/uL (ref 0.0–0.1)
Basophils Relative: 0 %
Eosinophils Absolute: 0.1 10*3/uL (ref 0.0–0.5)
Eosinophils Relative: 4 %
HCT: 38.1 % — ABNORMAL LOW (ref 39.0–52.0)
Hemoglobin: 13.7 g/dL (ref 13.0–17.0)
Lymphocytes Relative: 37 %
Lymphs Abs: 1 10*3/uL (ref 0.7–4.0)
MCH: 36.1 pg — ABNORMAL HIGH (ref 26.0–34.0)
MCHC: 36 g/dL (ref 30.0–36.0)
MCV: 100.5 fL — ABNORMAL HIGH (ref 80.0–100.0)
Monocytes Absolute: 0.3 10*3/uL (ref 0.1–1.0)
Monocytes Relative: 10 %
Neutro Abs: 1.2 10*3/uL — ABNORMAL LOW (ref 1.7–7.7)
Neutrophils Relative %: 48 %
Platelet Count: 132 10*3/uL — ABNORMAL LOW (ref 150–400)
RBC: 3.79 MIL/uL — ABNORMAL LOW (ref 4.22–5.81)
RDW: 12.9 % (ref 11.5–15.5)
WBC Count: 2.6 10*3/uL — ABNORMAL LOW (ref 4.0–10.5)
nRBC: 0 % (ref 0.0–0.2)

## 2021-06-05 MED ORDER — BORTEZOMIB CHEMO SQ INJECTION 3.5 MG (2.5MG/ML)
3.0000 mg | Freq: Once | INTRAMUSCULAR | Status: AC
  Administered 2021-06-05: 3 mg via SUBCUTANEOUS
  Filled 2021-06-05: qty 1.2

## 2021-06-05 NOTE — Progress Notes (Signed)
Patient is requesting that Revlimid be sent as a generic prescription.  Ok to send Revlimid as generic per order of Dr. [name].  Per Leron Croak [facility],  "It shouldn't be an issue for him to get generic. I know there has been issues with patients switching over to generic just because of supply chain issues with the company rolling out the generic product now. As long as Dr. [name] doesn't pick the DAW1 for Revlimid when he sends the prescription they should be able to fill it for lenalidomide from a dispensing point of view."  Patient notified of above.

## 2021-06-05 NOTE — Patient Instructions (Signed)
Bortezomib injection What is this medication? BORTEZOMIB (bor TEZ oh mib) targets proteins in cancer cells and stops thecancer cells from growing. It treats multiple myeloma and mantle cell lymphoma. This medicine may be used for other purposes; ask your health care provider orpharmacist if you have questions. COMMON BRAND NAME(S): Velcade What should I tell my care team before I take this medication? They need to know if you have any of these conditions: dehydration diabetes (high blood sugar) heart disease liver disease tingling of the fingers or toes or other nerve disorder an unusual or allergic reaction to bortezomib, mannitol, boron, other medicines, foods, dyes, or preservatives pregnant or trying to get pregnant breast-feeding How should I use this medication? This medicine is injected into a vein or under the skin. It is given by ahealth care provider in a hospital or clinic setting. Talk to your health care provider about the use of this medicine in children.Special care may be needed. Overdosage: If you think you have taken too much of this medicine contact apoison control center or emergency room at once. NOTE: This medicine is only for you. Do not share this medicine with others. What if I miss a dose? Keep appointments for follow-up doses. It is important not to miss your dose.Call your health care provider if you are unable to keep an appointment. What may interact with this medication? This medicine may interact with the following medications: ketoconazole rifampin This list may not describe all possible interactions. Give your health care provider a list of all the medicines, herbs, non-prescription drugs, or dietary supplements you use. Also tell them if you smoke, drink alcohol, or use illegaldrugs. Some items may interact with your medicine. What should I watch for while using this medication? Your condition will be monitored carefully while you are receiving  thismedicine. You may need blood work done while you are taking this medicine. You may get drowsy or dizzy. Do not drive, use machinery, or do anything that needs mental alertness until you know how this medicine affects you. Do not stand up or sit up quickly, especially if you are an older patient. Thisreduces the risk of dizzy or fainting spells This medicine may increase your risk of getting an infection. Call your health care provider for advice if you get a fever, chills, sore throat, or other symptoms of a cold or flu. Do not treat yourself. Try to avoid being aroundpeople who are sick. Check with your health care provider if you have severe diarrhea, nausea, and vomiting, or if you sweat a lot. The loss of too much body fluid may make itdangerous for you to take this medicine. Do not become pregnant while taking this medicine or for 7 months after stopping it. Women should inform their health care provider if they wish to become pregnant or think they might be pregnant. Men should not father a child while taking this medicine and for 4 months after stopping it. There is a potential for serious harm to an unborn child. Talk to your health care provider for more information. Do not breast-feed an infant while taking thismedicine or for 2 months after stopping it. This medicine may make it more difficult to get pregnant or father a child.Talk to your health care provider if you are concerned about your fertility. What side effects may I notice from receiving this medication? Side effects that you should report to your doctor or health care professionalas soon as possible: allergic reactions (skin rash; itching or hives; swelling   care professional as soon as possible: ?allergic reactions (skin rash; itching or hives; swelling of the face, lips, or tongue) ?bleeding (bloody or black, tarry stools; red or dark brown urine; spitting up blood or brown material that looks like coffee grounds; red spots on the skin; unusual bruising or bleeding from the eye, gums, or nose) ?blurred vision or changes  in vision ?confusion ?constipation ?headache ?heart failure (trouble breathing; fast, irregular heartbeat; sudden weight gain; swelling of the ankles, feet, hands) ?infection (fever, chills, cough, sore throat, pain or trouble passing urine) ?lack or loss of appetite ?liver injury (dark yellow or brown urine; general ill feeling or flu-like symptoms; loss of appetite, right upper belly pain; yellowing of the eyes or skin) ?low blood pressure (dizziness; feeling faint or lightheaded, falls; unusually weak or tired) ?muscle cramps ?pain, redness, or irritation at site where injected ?pain, tingling, numbness in the hands or feet ?seizures ?trouble breathing ?unusual bruising or bleeding ?Side effects that usually do not require medical attention (report to your doctor or health care professional if they continue or are bothersome): ?diarrhea ?nausea ?stomach pain ?trouble sleeping ?vomiting ?This list may not describe all possible side effects. Call your doctor for medical advice about side effects. You may report side effects to FDA at 1-800-FDA-1088. ?Where should I keep my medication? ?This medicine is given in a hospital or clinic. It will not be stored at home. ?NOTE: This sheet is a summary. It may not cover all possible information. If you have questions about this medicine, talk to your doctor, pharmacist, or health care provider. ?? 2022 Elsevier/Gold Standard (2020-03-27 00:00:00) ?

## 2021-06-05 NOTE — Progress Notes (Signed)
Per dr ennever, okay to treat today despite labs. 

## 2021-06-19 ENCOUNTER — Other Ambulatory Visit: Payer: Self-pay

## 2021-06-19 ENCOUNTER — Inpatient Hospital Stay: Payer: Medicare Other | Attending: Hematology & Oncology

## 2021-06-19 ENCOUNTER — Inpatient Hospital Stay: Payer: Medicare Other

## 2021-06-19 VITALS — BP 138/87 | HR 55 | Temp 98.7°F | Resp 17 | Ht 73.0 in | Wt 218.0 lb

## 2021-06-19 DIAGNOSIS — Z5112 Encounter for antineoplastic immunotherapy: Secondary | ICD-10-CM | POA: Diagnosis not present

## 2021-06-19 DIAGNOSIS — Z79899 Other long term (current) drug therapy: Secondary | ICD-10-CM | POA: Diagnosis not present

## 2021-06-19 DIAGNOSIS — C9001 Multiple myeloma in remission: Secondary | ICD-10-CM

## 2021-06-19 DIAGNOSIS — C9000 Multiple myeloma not having achieved remission: Secondary | ICD-10-CM | POA: Insufficient documentation

## 2021-06-19 LAB — CBC WITH DIFFERENTIAL (CANCER CENTER ONLY)
Abs Immature Granulocytes: 0 10*3/uL (ref 0.00–0.07)
Basophils Absolute: 0 10*3/uL (ref 0.0–0.1)
Basophils Relative: 1 %
Eosinophils Absolute: 0.1 10*3/uL (ref 0.0–0.5)
Eosinophils Relative: 2 %
HCT: 36.1 % — ABNORMAL LOW (ref 39.0–52.0)
Hemoglobin: 13.2 g/dL (ref 13.0–17.0)
Immature Granulocytes: 0 %
Lymphocytes Relative: 34 %
Lymphs Abs: 0.8 10*3/uL (ref 0.7–4.0)
MCH: 36.4 pg — ABNORMAL HIGH (ref 26.0–34.0)
MCHC: 36.6 g/dL — ABNORMAL HIGH (ref 30.0–36.0)
MCV: 99.4 fL (ref 80.0–100.0)
Monocytes Absolute: 0.2 10*3/uL (ref 0.1–1.0)
Monocytes Relative: 10 %
Neutro Abs: 1.2 10*3/uL — ABNORMAL LOW (ref 1.7–7.7)
Neutrophils Relative %: 53 %
Platelet Count: 143 10*3/uL — ABNORMAL LOW (ref 150–400)
RBC: 3.63 MIL/uL — ABNORMAL LOW (ref 4.22–5.81)
RDW: 12.8 % (ref 11.5–15.5)
WBC Count: 2.3 10*3/uL — ABNORMAL LOW (ref 4.0–10.5)
nRBC: 0 % (ref 0.0–0.2)

## 2021-06-19 LAB — CMP (CANCER CENTER ONLY)
ALT: 53 U/L — ABNORMAL HIGH (ref 0–44)
AST: 52 U/L — ABNORMAL HIGH (ref 15–41)
Albumin: 3.9 g/dL (ref 3.5–5.0)
Alkaline Phosphatase: 57 U/L (ref 38–126)
Anion gap: 6 (ref 5–15)
BUN: 19 mg/dL (ref 6–20)
CO2: 25 mmol/L (ref 22–32)
Calcium: 8.5 mg/dL — ABNORMAL LOW (ref 8.9–10.3)
Chloride: 105 mmol/L (ref 98–111)
Creatinine: 1.37 mg/dL — ABNORMAL HIGH (ref 0.61–1.24)
GFR, Estimated: >60 mL/min (ref >60)
Glucose, Bld: 99 mg/dL (ref 70–99)
Potassium: 4.1 mmol/L (ref 3.5–5.1)
Sodium: 136 mmol/L (ref 135–145)
Total Bilirubin: 0.6 mg/dL (ref 0.3–1.2)
Total Protein: 6.7 g/dL (ref 6.5–8.1)

## 2021-06-19 MED ORDER — BORTEZOMIB CHEMO SQ INJECTION 3.5 MG (2.5MG/ML)
3.0000 mg | Freq: Once | INTRAMUSCULAR | Status: AC
  Administered 2021-06-19: 3 mg via SUBCUTANEOUS
  Filled 2021-06-19: qty 1.2

## 2021-06-19 NOTE — Progress Notes (Signed)
MD reviewed cbc and cmet, "ok to treat despite counts and pending ANC" ?

## 2021-06-19 NOTE — Patient Instructions (Signed)
Bortezomib injection ?What is this medication? ?BORTEZOMIB (bor TEZ oh mib) targets proteins in cancer cells and stops the cancer cells from growing. It treats multiple myeloma and mantle cell lymphoma. ?This medicine may be used for other purposes; ask your health care provider or pharmacist if you have questions. ?COMMON BRAND NAME(S): Velcade ?What should I tell my care team before I take this medication? ?They need to know if you have any of these conditions: ?dehydration ?diabetes (high blood sugar) ?heart disease ?liver disease ?tingling of the fingers or toes or other nerve disorder ?an unusual or allergic reaction to bortezomib, mannitol, boron, other medicines, foods, dyes, or preservatives ?pregnant or trying to get pregnant ?breast-feeding ?How should I use this medication? ?This medicine is injected into a vein or under the skin. It is given by a health care provider in a hospital or clinic setting. ?Talk to your health care provider about the use of this medicine in children. Special care may be needed. ?Overdosage: If you think you have taken too much of this medicine contact a poison control center or emergency room at once. ?NOTE: This medicine is only for you. Do not share this medicine with others. ?What if I miss a dose? ?Keep appointments for follow-up doses. It is important not to miss your dose. Call your health care provider if you are unable to keep an appointment. ?What may interact with this medication? ?This medicine may interact with the following medications: ?ketoconazole ?rifampin ?This list may not describe all possible interactions. Give your health care provider a list of all the medicines, herbs, non-prescription drugs, or dietary supplements you use. Also tell them if you smoke, drink alcohol, or use illegal drugs. Some items may interact with your medicine. ?What should I watch for while using this medication? ?Your condition will be monitored carefully while you are receiving  this medicine. ?You may need blood work done while you are taking this medicine. ?You may get drowsy or dizzy. Do not drive, use machinery, or do anything that needs mental alertness until you know how this medicine affects you. Do not stand up or sit up quickly, especially if you are an older patient. This reduces the risk of dizzy or fainting spells ?This medicine may increase your risk of getting an infection. Call your health care provider for advice if you get a fever, chills, sore throat, or other symptoms of a cold or flu. Do not treat yourself. Try to avoid being around people who are sick. ?Check with your health care provider if you have severe diarrhea, nausea, and vomiting, or if you sweat a lot. The loss of too much body fluid may make it dangerous for you to take this medicine. ?Do not become pregnant while taking this medicine or for 7 months after stopping it. Women should inform their health care provider if they wish to become pregnant or think they might be pregnant. Men should not father a child while taking this medicine and for 4 months after stopping it. There is a potential for serious harm to an unborn child. Talk to your health care provider for more information. Do not breast-feed an infant while taking this medicine or for 2 months after stopping it. ?This medicine may make it more difficult to get pregnant or father a child. Talk to your health care provider if you are concerned about your fertility. ?What side effects may I notice from receiving this medication? ?Side effects that you should report to your doctor or health  care professional as soon as possible: ?allergic reactions (skin rash; itching or hives; swelling of the face, lips, or tongue) ?bleeding (bloody or black, tarry stools; red or dark brown urine; spitting up blood or brown material that looks like coffee grounds; red spots on the skin; unusual bruising or bleeding from the eye, gums, or nose) ?blurred vision or changes  in vision ?confusion ?constipation ?headache ?heart failure (trouble breathing; fast, irregular heartbeat; sudden weight gain; swelling of the ankles, feet, hands) ?infection (fever, chills, cough, sore throat, pain or trouble passing urine) ?lack or loss of appetite ?liver injury (dark yellow or brown urine; general ill feeling or flu-like symptoms; loss of appetite, right upper belly pain; yellowing of the eyes or skin) ?low blood pressure (dizziness; feeling faint or lightheaded, falls; unusually weak or tired) ?muscle cramps ?pain, redness, or irritation at site where injected ?pain, tingling, numbness in the hands or feet ?seizures ?trouble breathing ?unusual bruising or bleeding ?Side effects that usually do not require medical attention (report to your doctor or health care professional if they continue or are bothersome): ?diarrhea ?nausea ?stomach pain ?trouble sleeping ?vomiting ?This list may not describe all possible side effects. Call your doctor for medical advice about side effects. You may report side effects to FDA at 1-800-FDA-1088. ?Where should I keep my medication? ?This medicine is given in a hospital or clinic. It will not be stored at home. ?NOTE: This sheet is a summary. It may not cover all possible information. If you have questions about this medicine, talk to your doctor, pharmacist, or health care provider. ?? 2022 Elsevier/Gold Standard (2020-03-27 00:00:00) ?

## 2021-06-26 ENCOUNTER — Other Ambulatory Visit: Payer: Self-pay | Admitting: *Deleted

## 2021-06-26 DIAGNOSIS — C9002 Multiple myeloma in relapse: Secondary | ICD-10-CM

## 2021-06-26 MED ORDER — LENALIDOMIDE 10 MG PO CAPS: ORAL_CAPSULE | ORAL | 0 refills | Status: DC

## 2021-07-02 ENCOUNTER — Inpatient Hospital Stay (HOSPITAL_BASED_OUTPATIENT_CLINIC_OR_DEPARTMENT_OTHER): Payer: Medicare Other | Admitting: Family

## 2021-07-02 ENCOUNTER — Other Ambulatory Visit: Payer: Self-pay

## 2021-07-02 ENCOUNTER — Inpatient Hospital Stay: Payer: Medicare Other

## 2021-07-02 ENCOUNTER — Encounter: Payer: Self-pay | Admitting: Family

## 2021-07-02 VITALS — BP 124/88 | HR 56 | Temp 97.7°F | Resp 18 | Ht 73.0 in | Wt 217.1 lb

## 2021-07-02 DIAGNOSIS — C9001 Multiple myeloma in remission: Secondary | ICD-10-CM

## 2021-07-02 DIAGNOSIS — C9000 Multiple myeloma not having achieved remission: Secondary | ICD-10-CM | POA: Diagnosis not present

## 2021-07-02 DIAGNOSIS — Z79899 Other long term (current) drug therapy: Secondary | ICD-10-CM | POA: Diagnosis not present

## 2021-07-02 DIAGNOSIS — Z5112 Encounter for antineoplastic immunotherapy: Secondary | ICD-10-CM | POA: Diagnosis not present

## 2021-07-02 LAB — CBC WITH DIFFERENTIAL (CANCER CENTER ONLY)
Abs Immature Granulocytes: 0.01 10*3/uL (ref 0.00–0.07)
Basophils Absolute: 0 10*3/uL (ref 0.0–0.1)
Basophils Relative: 1 %
Eosinophils Absolute: 0.1 10*3/uL (ref 0.0–0.5)
Eosinophils Relative: 4 %
HCT: 38.5 % — ABNORMAL LOW (ref 39.0–52.0)
Hemoglobin: 13.7 g/dL (ref 13.0–17.0)
Immature Granulocytes: 0 %
Lymphocytes Relative: 33 %
Lymphs Abs: 0.9 10*3/uL (ref 0.7–4.0)
MCH: 36.3 pg — ABNORMAL HIGH (ref 26.0–34.0)
MCHC: 35.6 g/dL (ref 30.0–36.0)
MCV: 102.1 fL — ABNORMAL HIGH (ref 80.0–100.0)
Monocytes Absolute: 0.4 10*3/uL (ref 0.1–1.0)
Monocytes Relative: 15 %
Neutro Abs: 1.3 10*3/uL — ABNORMAL LOW (ref 1.7–7.7)
Neutrophils Relative %: 47 %
Platelet Count: 121 10*3/uL — ABNORMAL LOW (ref 150–400)
RBC: 3.77 MIL/uL — ABNORMAL LOW (ref 4.22–5.81)
RDW: 13 % (ref 11.5–15.5)
WBC Count: 2.7 10*3/uL — ABNORMAL LOW (ref 4.0–10.5)
nRBC: 0 % (ref 0.0–0.2)

## 2021-07-02 LAB — CMP (CANCER CENTER ONLY)
ALT: 49 U/L — ABNORMAL HIGH (ref 0–44)
AST: 29 U/L (ref 15–41)
Albumin: 4.5 g/dL (ref 3.5–5.0)
Alkaline Phosphatase: 61 U/L (ref 38–126)
Anion gap: 5 (ref 5–15)
BUN: 16 mg/dL (ref 6–20)
CO2: 28 mmol/L (ref 22–32)
Calcium: 9.1 mg/dL (ref 8.9–10.3)
Chloride: 104 mmol/L (ref 98–111)
Creatinine: 1.41 mg/dL — ABNORMAL HIGH (ref 0.61–1.24)
GFR, Estimated: 58 mL/min — ABNORMAL LOW (ref >60)
Glucose, Bld: 83 mg/dL (ref 70–99)
Potassium: 4.2 mmol/L (ref 3.5–5.1)
Sodium: 137 mmol/L (ref 135–145)
Total Bilirubin: 1 mg/dL (ref 0.3–1.2)
Total Protein: 7.5 g/dL (ref 6.5–8.1)

## 2021-07-02 LAB — LACTATE DEHYDROGENASE: LDH: 144 U/L (ref 98–192)

## 2021-07-02 MED ORDER — BORTEZOMIB CHEMO SQ INJECTION 3.5 MG (2.5MG/ML)
3.0000 mg | Freq: Once | INTRAMUSCULAR | Status: AC
  Administered 2021-07-02: 3 mg via SUBCUTANEOUS
  Filled 2021-07-02: qty 1.2

## 2021-07-02 NOTE — Progress Notes (Signed)
Ok to treat with ANC 1.3 per Dr. Ennever.  

## 2021-07-02 NOTE — Patient Instructions (Signed)
[facility]  Discharge Instructions: ?Thank you for choosing [facility] to provide your oncology and hematology care.  ? ?If you have a lab appointment with the [facility], please go directly to the [facility] and check in at the registration area. ? ?Wear comfortable clothing and clothing appropriate for easy access to any Portacath or PICC line.  ? ?We strive to give you quality time with your provider. You may need to reschedule your appointment if you arrive late (15 or more minutes).  Arriving late affects you and other patients whose appointments are after yours.  Also, if you miss three or more appointments without notifying the office, you may be dismissed from the clinic at the provider?s discretion.    ?  ?For prescription refill requests, have your pharmacy contact our office and allow 72 hours for refills to be completed.   ? ?Today you received the following chemotherapy and/or immunotherapy agents Velcade    ?  ?To help prevent nausea and vomiting after your treatment, we encourage you to take your nausea medication as directed. ? ?BELOW ARE SYMPTOMS THAT SHOULD BE REPORTED IMMEDIATELY: ?*FEVER GREATER THAN 100.4 F (38 ?C) OR HIGHER ?*CHILLS OR SWEATING ?*NAUSEA AND VOMITING THAT IS NOT CONTROLLED WITH YOUR NAUSEA MEDICATION ?*UNUSUAL SHORTNESS OF BREATH ?*UNUSUAL BRUISING OR BLEEDING ?*URINARY PROBLEMS (pain or burning when urinating, or frequent urination) ?*BOWEL PROBLEMS (unusual diarrhea, constipation, pain near the anus) ?TENDERNESS IN MOUTH AND THROAT WITH OR WITHOUT PRESENCE OF ULCERS (sore throat, sores in mouth, or a toothache) ?UNUSUAL RASH, SWELLING OR PAIN  ?UNUSUAL VAGINAL DISCHARGE OR ITCHING  ? ?Items with * indicate a potential emergency and should be followed up as soon as possible or go to the Emergency Department if any problems should occur. ? ?Please show the CHEMOTHERAPY ALERT CARD or IMMUNOTHERAPY ALERT CARD at check-in to the  Emergency Department and triage nurse. ?Should you have questions after your visit or need to cancel or reschedule your appointment, please contact [facility]  [phone] and follow the prompts.  Office hours are 8:00 a.m. to 4:30 p.m. Monday - Friday. Please note that voicemails left after 4:00 p.m. may not be returned until the following business day.  We are closed weekends and major holidays. You have access to a nurse at all times for urgent questions. Please call the main number to the clinic [phone] and follow the prompts. ? ?For any non-urgent questions, you may also contact your provider using MyChart. We now offer e-Visits for anyone 33 and older to request care online for non-urgent symptoms. For details visit [email or web address] ?  ?Also download the MyChart app! Go to the app store, search "MyChart", open the app, select [facility], and log in with your MyChart username and password. ? ?Due to Covid, a mask is required upon entering the hospital/clinic. If you do not have a mask, one will be given to you upon arrival. For doctor visits, patients may have 1 support person aged 66 or older with them. For treatment visits, patients cannot have anyone with them due to current Covid guidelines and our immunocompromised population.  ?

## 2021-07-02 NOTE — Progress Notes (Deleted)
y

## 2021-07-02 NOTE — Progress Notes (Signed)
?Hematology and Oncology Follow Up Visit ? ?Tyler Phillips. ?[id number] ?1965-12-27 56 y.o. ?07/02/2021 ? ? ?Principle Diagnosis:  ?IgG Kappa myeloma - +4, +14, +17 ?  ?Past Therapy: ?Status post autologous stem cell transplant on 05/22/2015   ?S/p cycle 6 of RVD ?Ninlaro 4 mg po q 2 week -- start on 01/02/2019 -- d/c on 02/10/2019 ?  ?Current Therapy:        ?Velcade q 2wk dosing ?Revlimid '10mg'$  po q day (21/7)  ?  ?Interim History:  [name] is here today for follow-up and treatment. He continues to do well and has no complaints at this time.  ?February no M-spike detected, IgG level was 1,303 mg/dL and kappa light chains 4.58 mg/dL.  ?No fever, chills, n/v, cough, rash, dizziness, SOB, chest pain, palpitations, abdominal pain or changes in bowel or bladder habits.  ?No swelling in his extremities at this time.  ?Neuropathy in his lower extremities is unchanged from baseline.  ?No falls or syncope to report. He continues to stay active working out and enjoying working outside.  ?He has maintained a good appetite and is staying well hydrated throughout the day. Weight stable at 217 lbs.  ? ?ECOG Performance Status: 1 - Symptomatic but completely ambulatory ? ?Medications:  ?Allergies as of 07/02/2021   ?No Known Allergies ?  ? ?  ?Medication List  ?  ? ?  ? Accurate as of July 02, 2021  8:46 AM. If you have any questions, ask your nurse or doctor.  ?  ?  ? ?  ? ?aspirin 325 MG tablet ?Take 325 mg by mouth daily. ?  ?azithromycin 500 MG tablet ?Commonly known as: ZITHROMAX ?1 tablet ?  ?bortezomib IV 3.5 MG injection ?Commonly known as: VELCADE ?Inject into the vein every 14 (fourteen) days. ?  ?cetirizine 10 MG tablet ?Commonly known as: ZYRTEC ?Take 10 mg by mouth daily. ?  ?lenalidomide 10 MG capsule ?Commonly known as: Revlimid ?TAKE 1 CAPSULE BY MOUTH DAILY  FOR 21 DAYS, THEN 7 DAYS OFF Auth Number [id number] ?  ?LORazepam 1 MG tablet ?Commonly known as: ATIVAN ?Take 1 tablet (1 mg total) by mouth every 4 (four)  hours as needed. ?  ?Pfizer-BioNTech COVID-19 Vacc 30 MCG/0.3ML injection ?Generic drug: COVID-19 mRNA vaccine [employer]) ?  ?valACYclovir 500 MG tablet ?Commonly known as: VALTREX ?Take 500 mg by mouth 2 (two) times daily. ?  ?zolpidem 10 MG tablet ?Commonly known as: AMBIEN ?Take 1 tablet (10 mg total) by mouth at bedtime as needed for sleep. ?  ? ?  ? ? ?Allergies: No Known Allergies ? ?Past Medical History, Surgical history, Social history, and Family History were reviewed and updated. ? ?Review of Systems: ?All other 10 point review of systems is negative.  ? ?Physical Exam: ? vitals were not taken for this visit.  ? ?Wt Readings from Last 3 Encounters:  ?06/19/21 218 lb (98.9 kg)  ?05/22/21 218 lb (98.9 kg)  ?04/03/21 217 lb 6.4 oz (98.6 kg)  ? ? ?Ocular: Sclerae unicteric, pupils equal, round and reactive to light ?Ear-nose-throat: Oropharynx clear, dentition fair ?Lymphatic: No cervical or supraclavicular adenopathy ?Lungs no rales or rhonchi, good excursion bilaterally ?Heart regular rate and rhythm, no murmur appreciated ?Abd soft, nontender, positive bowel sounds ?MSK no focal spinal tenderness, no joint edema ?Neuro: non-focal, well-oriented, appropriate affect ?Breasts: Deferred  ? ?Lab Results  ?Component Value Date  ? WBC 2.3 (L) 06/19/2021  ? HGB 13.2 06/19/2021  ? HCT 36.1 (L) 06/19/2021  ?  MCV 99.4 06/19/2021  ? PLT 143 (L) 06/19/2021  ? ?Lab Results  ?Component Value Date  ? FERRITIN 1,263 (H) 07/20/2014  ? IRON 125 07/20/2014  ? TIBC 198 (L) 07/20/2014  ? UIBC 73 (L) 07/20/2014  ? IRONPCTSAT 63 (H) 07/20/2014  ? ?Lab Results  ?Component Value Date  ? RETICCTPCT 0.8 07/20/2014  ? RBC 3.63 (L) 06/19/2021  ? ?Lab Results  ?Component Value Date  ? KPAFRELGTCHN 45.8 (H) 05/22/2021  ? LAMBDASER 32.6 (H) 05/22/2021  ? KAPLAMBRATIO 1.40 05/22/2021  ? ?Lab Results  ?Component Value Date  ? IGGSERUM 1,303 05/22/2021  ? IGA 208 05/22/2021  ? IGMSERUM 19 (L) 05/22/2021  ? ?Lab Results  ?Component Value Date  ?  TOTALPROTELP 6.9 05/22/2021  ? ALBUMINELP 4.0 05/22/2021  ? A1GS 0.1 05/22/2021  ? A2GS 0.6 05/22/2021  ? BETS 0.8 05/22/2021  ? BETA2SER 0.3 03/28/2015  ? GAMS 1.3 05/22/2021  ? MSPIKE Not Observed 05/22/2021  ? SPEI Comment 05/22/2021  ? ?  Chemistry   ?   ?Component Value Date/Time  ? NA 136 06/19/2021 0817  ? NA 140 04/22/2017 1140  ? NA 138 09/03/2015 1042  ? K 4.1 06/19/2021 0817  ? K 4.0 04/22/2017 1140  ? K 4.3 09/03/2015 1042  ? CL 105 06/19/2021 0817  ? CL 105 04/22/2017 1140  ? CO2 25 06/19/2021 0817  ? CO2 24 04/22/2017 1140  ? CO2 25 09/03/2015 1042  ? BUN 19 06/19/2021 0817  ? BUN 16 04/22/2017 1140  ? BUN 21.7 09/03/2015 1042  ? CREATININE 1.37 (H) 06/19/2021 0817  ? CREATININE 1.6 (H) 04/22/2017 1140  ? CREATININE 1.2 09/03/2015 1042  ?    ?Component Value Date/Time  ? CALCIUM 8.5 (L) 06/19/2021 0817  ? CALCIUM 8.5 04/22/2017 1140  ? CALCIUM 9.1 09/03/2015 1042  ? ALKPHOS 57 06/19/2021 0817  ? ALKPHOS 59 04/22/2017 1140  ? ALKPHOS 42 09/03/2015 1042  ? AST 52 (H) 06/19/2021 0817  ? AST 27 09/03/2015 1042  ? ALT 53 (H) 06/19/2021 0817  ? ALT 45 04/22/2017 1140  ? ALT 41 09/03/2015 1042  ? BILITOT 0.6 06/19/2021 0817  ? BILITOT 0.54 09/03/2015 1042  ?  ? ? ? ?Impression and Plan: [name] is a very pleasant 56 yo caucasian gentleman with IgG kappa myeloma. He underwent induction chemotherapy with RVD followed by an autologous stem cell transplant at [facility] on February 2017.  ?Light chains pending.  ?Velcade given as planned.  ?Lab check and injection every 2 weeks and follow-up in 8 weeks.  ? ?Lottie Dawson, NP ?3/16/20238:46 AM ? ?

## 2021-07-03 ENCOUNTER — Telehealth: Payer: Self-pay | Admitting: *Deleted

## 2021-07-03 ENCOUNTER — Inpatient Hospital Stay: Payer: Medicare Other

## 2021-07-03 ENCOUNTER — Inpatient Hospital Stay: Payer: Medicare Other | Admitting: Family

## 2021-07-03 LAB — IGG, IGA, IGM
IgA: 238 mg/dL (ref 90–386)
IgG (Immunoglobin G), Serum: 1350 mg/dL (ref 603–1613)
IgM (Immunoglobulin M), Srm: 22 mg/dL (ref 20–172)

## 2021-07-03 LAB — KAPPA/LAMBDA LIGHT CHAINS
Kappa free light chain: 46.6 mg/L — ABNORMAL HIGH (ref 3.3–19.4)
Kappa, lambda light chain ratio: 1.43 (ref 0.26–1.65)
Lambda free light chains: 32.5 mg/L — ABNORMAL HIGH (ref 5.7–26.3)

## 2021-07-03 NOTE — Telephone Encounter (Signed)
Per 07/02/21 los - view in [place] ?

## 2021-07-06 LAB — PROTEIN ELECTROPHORESIS, SERUM
A/G Ratio: 1.4 (ref 0.7–1.7)
Albumin ELP: 4.1 g/dL (ref 2.9–4.4)
Alpha-1-Globulin: 0.2 g/dL (ref 0.0–0.4)
Alpha-2-Globulin: 0.6 g/dL (ref 0.4–1.0)
Beta Globulin: 0.9 g/dL (ref 0.7–1.3)
Gamma Globulin: 1.3 g/dL (ref 0.4–1.8)
Globulin, Total: 2.9 g/dL (ref 2.2–3.9)
Total Protein ELP: 7 g/dL (ref 6.0–8.5)

## 2021-07-16 ENCOUNTER — Inpatient Hospital Stay: Payer: Medicare Other

## 2021-07-16 VITALS — BP 145/88 | HR 66 | Resp 18

## 2021-07-16 DIAGNOSIS — Z79899 Other long term (current) drug therapy: Secondary | ICD-10-CM | POA: Diagnosis not present

## 2021-07-16 DIAGNOSIS — C9000 Multiple myeloma not having achieved remission: Secondary | ICD-10-CM | POA: Diagnosis not present

## 2021-07-16 DIAGNOSIS — C9001 Multiple myeloma in remission: Secondary | ICD-10-CM

## 2021-07-16 DIAGNOSIS — Z5112 Encounter for antineoplastic immunotherapy: Secondary | ICD-10-CM | POA: Diagnosis not present

## 2021-07-16 LAB — CMP (CANCER CENTER ONLY)
ALT: 61 U/L — ABNORMAL HIGH (ref 0–44)
AST: 35 U/L (ref 15–41)
Albumin: 4.4 g/dL (ref 3.5–5.0)
Alkaline Phosphatase: 65 U/L (ref 38–126)
Anion gap: 8 (ref 5–15)
BUN: 15 mg/dL (ref 6–20)
CO2: 25 mmol/L (ref 22–32)
Calcium: 9.3 mg/dL (ref 8.9–10.3)
Chloride: 104 mmol/L (ref 98–111)
Creatinine: 1.44 mg/dL — ABNORMAL HIGH (ref 0.61–1.24)
GFR, Estimated: 57 mL/min — ABNORMAL LOW (ref >60)
Glucose, Bld: 73 mg/dL (ref 70–99)
Potassium: 3.7 mmol/L (ref 3.5–5.1)
Sodium: 137 mmol/L (ref 135–145)
Total Bilirubin: 0.5 mg/dL (ref 0.3–1.2)
Total Protein: 7 g/dL (ref 6.5–8.1)

## 2021-07-16 LAB — CBC WITH DIFFERENTIAL (CANCER CENTER ONLY)
Abs Immature Granulocytes: 0.01 10*3/uL (ref 0.00–0.07)
Basophils Absolute: 0 10*3/uL (ref 0.0–0.1)
Basophils Relative: 1 %
Eosinophils Absolute: 0.1 10*3/uL (ref 0.0–0.5)
Eosinophils Relative: 2 %
HCT: 38.5 % — ABNORMAL LOW (ref 39.0–52.0)
Hemoglobin: 13.7 g/dL (ref 13.0–17.0)
Immature Granulocytes: 0 %
Lymphocytes Relative: 36 %
Lymphs Abs: 1 10*3/uL (ref 0.7–4.0)
MCH: 36.2 pg — ABNORMAL HIGH (ref 26.0–34.0)
MCHC: 35.6 g/dL (ref 30.0–36.0)
MCV: 101.9 fL — ABNORMAL HIGH (ref 80.0–100.0)
Monocytes Absolute: 0.3 10*3/uL (ref 0.1–1.0)
Monocytes Relative: 9 %
Neutro Abs: 1.4 10*3/uL — ABNORMAL LOW (ref 1.7–7.7)
Neutrophils Relative %: 52 %
Platelet Count: 146 10*3/uL — ABNORMAL LOW (ref 150–400)
RBC: 3.78 MIL/uL — ABNORMAL LOW (ref 4.22–5.81)
RDW: 13.2 % (ref 11.5–15.5)
WBC Count: 2.8 10*3/uL — ABNORMAL LOW (ref 4.0–10.5)
nRBC: 0 % (ref 0.0–0.2)

## 2021-07-16 MED ORDER — BORTEZOMIB CHEMO SQ INJECTION 3.5 MG (2.5MG/ML)
3.0000 mg | Freq: Once | INTRAMUSCULAR | Status: AC
  Administered 2021-07-16: 3 mg via SUBCUTANEOUS
  Filled 2021-07-16: qty 1.2

## 2021-07-16 NOTE — Patient Instructions (Signed)
[facility]  Discharge Instructions: ?Thank you for choosing [facility] to provide your oncology and hematology care.  ? ?If you have a lab appointment with the [facility], please go directly to the [facility] and check in at the registration area. ? ?Wear comfortable clothing and clothing appropriate for easy access to any Portacath or PICC line.  ? ?We strive to give you quality time with your provider. You may need to reschedule your appointment if you arrive late (15 or more minutes).  Arriving late affects you and other patients whose appointments are after yours.  Also, if you miss three or more appointments without notifying the office, you may be dismissed from the clinic at the provider?s discretion.    ?  ?For prescription refill requests, have your pharmacy contact our office and allow 72 hours for refills to be completed.   ? ?Today you received the following chemotherapy and/or immunotherapy agents Velcade.    ?  ?To help prevent nausea and vomiting after your treatment, we encourage you to take your nausea medication as directed. ? ?BELOW ARE SYMPTOMS THAT SHOULD BE REPORTED IMMEDIATELY: ?*FEVER GREATER THAN 100.4 F (38 ?C) OR HIGHER ?*CHILLS OR SWEATING ?*NAUSEA AND VOMITING THAT IS NOT CONTROLLED WITH YOUR NAUSEA MEDICATION ?*UNUSUAL SHORTNESS OF BREATH ?*UNUSUAL BRUISING OR BLEEDING ?*URINARY PROBLEMS (pain or burning when urinating, or frequent urination) ?*BOWEL PROBLEMS (unusual diarrhea, constipation, pain near the anus) ?TENDERNESS IN MOUTH AND THROAT WITH OR WITHOUT PRESENCE OF ULCERS (sore throat, sores in mouth, or a toothache) ?UNUSUAL RASH, SWELLING OR PAIN  ?UNUSUAL VAGINAL DISCHARGE OR ITCHING  ? ?Items with * indicate a potential emergency and should be followed up as soon as possible or go to the Emergency Department if any problems should occur. ? ?Please show the CHEMOTHERAPY ALERT CARD or IMMUNOTHERAPY ALERT CARD at check-in to the  Emergency Department and triage nurse. ?Should you have questions after your visit or need to cancel or reschedule your appointment, please contact [facility]  [phone] and follow the prompts.  Office hours are 8:00 a.m. to 4:30 p.m. Monday - Friday. Please note that voicemails left after 4:00 p.m. may not be returned until the following business day.  We are closed weekends and major holidays. You have access to a nurse at all times for urgent questions. Please call the main number to the clinic [phone] and follow the prompts. ? ?For any non-urgent questions, you may also contact your provider using MyChart. We now offer e-Visits for anyone 30 and older to request care online for non-urgent symptoms. For details visit [email or web address] ?  ?Also download the MyChart app! Go to the app store, search "MyChart", open the app, select [facility], and log in with your MyChart username and password. ? ?Due to Covid, a mask is required upon entering the hospital/clinic. If you do not have a mask, one will be given to you upon arrival. For doctor visits, patients may have 1 support person aged 10 or older with them. For treatment visits, patients cannot have anyone with them due to current Covid guidelines and our immunocompromised population.  ?

## 2021-07-28 ENCOUNTER — Other Ambulatory Visit: Payer: Self-pay

## 2021-07-28 ENCOUNTER — Other Ambulatory Visit: Payer: Self-pay | Admitting: Hematology & Oncology

## 2021-07-28 DIAGNOSIS — C9002 Multiple myeloma in relapse: Secondary | ICD-10-CM

## 2021-07-28 MED ORDER — LENALIDOMIDE 10 MG PO CAPS: ORAL_CAPSULE | ORAL | 0 refills | Status: DC

## 2021-07-30 ENCOUNTER — Ambulatory Visit: Payer: Medicare Other

## 2021-07-30 ENCOUNTER — Inpatient Hospital Stay: Payer: Medicare Other

## 2021-08-05 DIAGNOSIS — L989 Disorder of the skin and subcutaneous tissue, unspecified: Secondary | ICD-10-CM | POA: Diagnosis not present

## 2021-08-06 ENCOUNTER — Inpatient Hospital Stay: Payer: Medicare Other | Attending: Hematology & Oncology

## 2021-08-06 ENCOUNTER — Inpatient Hospital Stay: Payer: Medicare Other

## 2021-08-06 VITALS — BP 147/84 | HR 54 | Temp 97.6°F | Resp 17

## 2021-08-06 DIAGNOSIS — C9001 Multiple myeloma in remission: Secondary | ICD-10-CM

## 2021-08-06 DIAGNOSIS — C9000 Multiple myeloma not having achieved remission: Secondary | ICD-10-CM | POA: Diagnosis not present

## 2021-08-06 DIAGNOSIS — Z5112 Encounter for antineoplastic immunotherapy: Secondary | ICD-10-CM | POA: Diagnosis not present

## 2021-08-06 LAB — CMP (CANCER CENTER ONLY)
ALT: 44 U/L (ref 0–44)
AST: 25 U/L (ref 15–41)
Albumin: 4.1 g/dL (ref 3.5–5.0)
Alkaline Phosphatase: 55 U/L (ref 38–126)
Anion gap: 5 (ref 5–15)
BUN: 18 mg/dL (ref 6–20)
CO2: 26 mmol/L (ref 22–32)
Calcium: 8.8 mg/dL — ABNORMAL LOW (ref 8.9–10.3)
Chloride: 107 mmol/L (ref 98–111)
Creatinine: 1.33 mg/dL — ABNORMAL HIGH (ref 0.61–1.24)
GFR, Estimated: >60 mL/min (ref >60)
Glucose, Bld: 82 mg/dL (ref 70–99)
Potassium: 4.4 mmol/L (ref 3.5–5.1)
Sodium: 138 mmol/L (ref 135–145)
Total Bilirubin: 0.8 mg/dL (ref 0.3–1.2)
Total Protein: 7 g/dL (ref 6.5–8.1)

## 2021-08-06 LAB — CBC WITH DIFFERENTIAL (CANCER CENTER ONLY)
Abs Immature Granulocytes: 0 10*3/uL (ref 0.00–0.07)
Basophils Absolute: 0 10*3/uL (ref 0.0–0.1)
Basophils Relative: 0 %
Eosinophils Absolute: 0 10*3/uL (ref 0.0–0.5)
Eosinophils Relative: 1 %
HCT: 36.5 % — ABNORMAL LOW (ref 39.0–52.0)
Hemoglobin: 13.1 g/dL (ref 13.0–17.0)
Immature Granulocytes: 0 %
Lymphocytes Relative: 39 %
Lymphs Abs: 1 10*3/uL (ref 0.7–4.0)
MCH: 36.4 pg — ABNORMAL HIGH (ref 26.0–34.0)
MCHC: 35.9 g/dL (ref 30.0–36.0)
MCV: 101.4 fL — ABNORMAL HIGH (ref 80.0–100.0)
Monocytes Absolute: 0.3 10*3/uL (ref 0.1–1.0)
Monocytes Relative: 11 %
Neutro Abs: 1.2 10*3/uL — ABNORMAL LOW (ref 1.7–7.7)
Neutrophils Relative %: 49 %
Platelet Count: 134 10*3/uL — ABNORMAL LOW (ref 150–400)
RBC: 3.6 MIL/uL — ABNORMAL LOW (ref 4.22–5.81)
RDW: 13 % (ref 11.5–15.5)
Smear Review: NORMAL
WBC Count: 2.5 10*3/uL — ABNORMAL LOW (ref 4.0–10.5)
nRBC: 0 % (ref 0.0–0.2)

## 2021-08-06 MED ORDER — BORTEZOMIB CHEMO SQ INJECTION 3.5 MG (2.5MG/ML)
3.0000 mg | Freq: Once | INTRAMUSCULAR | Status: AC
  Administered 2021-08-06: 3 mg via SUBCUTANEOUS
  Filled 2021-08-06: qty 1.2

## 2021-08-06 NOTE — Progress Notes (Signed)
Reviewed pt labs with Dr. Ennever and pt ok to treat with ANC 1.2  

## 2021-08-06 NOTE — Patient Instructions (Signed)
[facility]  Discharge Instructions: ?Thank you for choosing [facility] to provide your oncology and hematology care.  ? ?If you have a lab appointment with the [facility], please go directly to the [facility] and check in at the registration area. ? ?Wear comfortable clothing and clothing appropriate for easy access to any Portacath or PICC line.  ? ?We strive to give you quality time with your provider. You may need to reschedule your appointment if you arrive late (15 or more minutes).  Arriving late affects you and other patients whose appointments are after yours.  Also, if you miss three or more appointments without notifying the office, you may be dismissed from the clinic at the provider?s discretion.    ?  ?For prescription refill requests, have your pharmacy contact our office and allow 72 hours for refills to be completed.   ? ?Today you received the following chemotherapy and/or immunotherapy agents Velcade    ?  ?To help prevent nausea and vomiting after your treatment, we encourage you to take your nausea medication as directed. ? ?BELOW ARE SYMPTOMS THAT SHOULD BE REPORTED IMMEDIATELY: ?*FEVER GREATER THAN 100.4 F (38 ?C) OR HIGHER ?*CHILLS OR SWEATING ?*NAUSEA AND VOMITING THAT IS NOT CONTROLLED WITH YOUR NAUSEA MEDICATION ?*UNUSUAL SHORTNESS OF BREATH ?*UNUSUAL BRUISING OR BLEEDING ?*URINARY PROBLEMS (pain or burning when urinating, or frequent urination) ?*BOWEL PROBLEMS (unusual diarrhea, constipation, pain near the anus) ?TENDERNESS IN MOUTH AND THROAT WITH OR WITHOUT PRESENCE OF ULCERS (sore throat, sores in mouth, or a toothache) ?UNUSUAL RASH, SWELLING OR PAIN  ?UNUSUAL VAGINAL DISCHARGE OR ITCHING  ? ?Items with * indicate a potential emergency and should be followed up as soon as possible or go to the Emergency Department if any problems should occur. ? ?Please show the CHEMOTHERAPY ALERT CARD or IMMUNOTHERAPY ALERT CARD at check-in to the  Emergency Department and triage nurse. ?Should you have questions after your visit or need to cancel or reschedule your appointment, please contact [facility]  [phone] and follow the prompts.  Office hours are 8:00 a.m. to 4:30 p.m. Monday - Friday. Please note that voicemails left after 4:00 p.m. may not be returned until the following business day.  We are closed weekends and major holidays. You have access to a nurse at all times for urgent questions. Please call the main number to the clinic [phone] and follow the prompts. ? ?For any non-urgent questions, you may also contact your provider using MyChart. We now offer e-Visits for anyone 31 and older to request care online for non-urgent symptoms. For details visit [email or web address] ?  ?Also download the MyChart app! Go to the app store, search "MyChart", open the app, select , and log in with your MyChart username and password. ? ?Due to Covid, a mask is required upon entering the hospital/clinic. If you do not have a mask, one will be given to you upon arrival. For doctor visits, patients may have 1 support person aged 57 or older with them. For treatment visits, patients cannot have anyone with them due to current Covid guidelines and our immunocompromised population.  ?

## 2021-08-13 ENCOUNTER — Ambulatory Visit: Payer: Medicare Other

## 2021-08-13 ENCOUNTER — Inpatient Hospital Stay: Payer: Medicare Other

## 2021-08-13 ENCOUNTER — Other Ambulatory Visit: Payer: Medicare Other

## 2021-08-24 ENCOUNTER — Other Ambulatory Visit: Payer: Self-pay | Admitting: Hematology & Oncology

## 2021-08-24 DIAGNOSIS — C9002 Multiple myeloma in relapse: Secondary | ICD-10-CM

## 2021-08-28 ENCOUNTER — Inpatient Hospital Stay: Payer: Medicare Other | Attending: Hematology & Oncology

## 2021-08-28 ENCOUNTER — Encounter: Payer: Self-pay | Admitting: Family

## 2021-08-28 ENCOUNTER — Inpatient Hospital Stay: Payer: Medicare Other

## 2021-08-28 ENCOUNTER — Inpatient Hospital Stay (HOSPITAL_BASED_OUTPATIENT_CLINIC_OR_DEPARTMENT_OTHER): Payer: Medicare Other | Admitting: Family

## 2021-08-28 VITALS — BP 141/82 | HR 60 | Temp 98.0°F | Resp 18 | Wt 213.0 lb

## 2021-08-28 DIAGNOSIS — Z5112 Encounter for antineoplastic immunotherapy: Secondary | ICD-10-CM | POA: Insufficient documentation

## 2021-08-28 DIAGNOSIS — R195 Other fecal abnormalities: Secondary | ICD-10-CM

## 2021-08-28 DIAGNOSIS — K649 Unspecified hemorrhoids: Secondary | ICD-10-CM

## 2021-08-28 DIAGNOSIS — C9001 Multiple myeloma in remission: Secondary | ICD-10-CM

## 2021-08-28 DIAGNOSIS — C9000 Multiple myeloma not having achieved remission: Secondary | ICD-10-CM | POA: Diagnosis not present

## 2021-08-28 DIAGNOSIS — Z79899 Other long term (current) drug therapy: Secondary | ICD-10-CM | POA: Insufficient documentation

## 2021-08-28 LAB — CBC WITH DIFFERENTIAL (CANCER CENTER ONLY)
Abs Immature Granulocytes: 0.02 10*3/uL (ref 0.00–0.07)
Basophils Absolute: 0 10*3/uL (ref 0.0–0.1)
Basophils Relative: 1 %
Eosinophils Absolute: 0.1 10*3/uL (ref 0.0–0.5)
Eosinophils Relative: 4 %
HCT: 38 % — ABNORMAL LOW (ref 39.0–52.0)
Hemoglobin: 13.4 g/dL (ref 13.0–17.0)
Immature Granulocytes: 1 %
Lymphocytes Relative: 37 %
Lymphs Abs: 0.9 10*3/uL (ref 0.7–4.0)
MCH: 35.9 pg — ABNORMAL HIGH (ref 26.0–34.0)
MCHC: 35.3 g/dL (ref 30.0–36.0)
MCV: 101.9 fL — ABNORMAL HIGH (ref 80.0–100.0)
Monocytes Absolute: 0.3 10*3/uL (ref 0.1–1.0)
Monocytes Relative: 11 %
Neutro Abs: 1.1 10*3/uL — ABNORMAL LOW (ref 1.7–7.7)
Neutrophils Relative %: 46 %
Platelet Count: 139 10*3/uL — ABNORMAL LOW (ref 150–400)
RBC: 3.73 MIL/uL — ABNORMAL LOW (ref 4.22–5.81)
RDW: 13.1 % (ref 11.5–15.5)
WBC Count: 2.4 10*3/uL — ABNORMAL LOW (ref 4.0–10.5)
nRBC: 0 % (ref 0.0–0.2)

## 2021-08-28 LAB — CMP (CANCER CENTER ONLY)
ALT: 46 U/L — ABNORMAL HIGH (ref 0–44)
AST: 24 U/L (ref 15–41)
Albumin: 4.4 g/dL (ref 3.5–5.0)
Alkaline Phosphatase: 61 U/L (ref 38–126)
Anion gap: 7 (ref 5–15)
BUN: 15 mg/dL (ref 6–20)
CO2: 24 mmol/L (ref 22–32)
Calcium: 9.3 mg/dL (ref 8.9–10.3)
Chloride: 106 mmol/L (ref 98–111)
Creatinine: 1.38 mg/dL — ABNORMAL HIGH (ref 0.61–1.24)
GFR, Estimated: >60 mL/min (ref >60)
Glucose, Bld: 80 mg/dL (ref 70–99)
Potassium: 4.2 mmol/L (ref 3.5–5.1)
Sodium: 137 mmol/L (ref 135–145)
Total Bilirubin: 0.6 mg/dL (ref 0.3–1.2)
Total Protein: 6.9 g/dL (ref 6.5–8.1)

## 2021-08-28 LAB — LACTATE DEHYDROGENASE: LDH: 131 U/L (ref 98–192)

## 2021-08-28 MED ORDER — BORTEZOMIB CHEMO SQ INJECTION 3.5 MG (2.5MG/ML)
3.0000 mg | Freq: Once | INTRAMUSCULAR | Status: AC
  Administered 2021-08-28: 3 mg via SUBCUTANEOUS
  Filled 2021-08-28: qty 1.2

## 2021-08-28 NOTE — Progress Notes (Signed)
Reviewed pt labs with Dr. [name] and pt ok to treat with ANC 1.1 ?

## 2021-08-28 NOTE — Progress Notes (Addendum)
?Hematology and Oncology Follow Up Visit ? ?Tyler Phillips. ?[id number] ?[birth year] 56 y.o. ?08/28/2021 ? ? ?Principle Diagnosis:  ?IgG Kappa myeloma - +4, +14, +17 ?  ?Past Therapy: ?Status post autologous stem cell transplant on 05/22/2015   ?S/p cycle 6 of RVD ?Ninlaro 4 mg po q 2 week -- start on 01/02/2019 -- d/c on 02/10/2019 ?  ?Current Therapy:        ?Velcade q 2wk dosing ?Revlimid '10mg'$  po q day (21/7)  ?  ?Interim History:  [name] is here today for follow-up and treatment. He is doing fairly well but still has issues with loose stool after eating and episodes of urgency since being on Revlimid. He also feels that he has a hemorrhoid that is bothersome at times.  ?No fever, chills, n/v, cough, rash, dizziness, SOB, chest pain, palpitations, abdominal pain or changes in bladder habits at this time. ?No M-spike observed in March. IgG level was 1,350 mg/dL and kappa light chains 4.66 mg/dL.  ?No swelling in his extremities.  ?Neuropathy in his lower extremities is unchanged from baseline.  ?No falls or syncope reported.  ?Appetite and hydration are good. Weight is stable at 213 lbs.  ?He stays active and enjoys [employer].  ? ?ECOG Performance Status: 1 - Symptomatic but completely ambulatory ? ?Medications:  ?Allergies as of 08/28/2021   ?No Known Allergies ?  ? ?  ?Medication List  ?  ? ?  ? Accurate as of Aug 28, 2021 10:03 AM. If you have any questions, ask your nurse or doctor.  ?  ?  ? ?  ? ?aspirin 325 MG tablet ?Take 325 mg by mouth daily. ?  ?azithromycin 500 MG tablet ?Commonly known as: ZITHROMAX ?  ?bortezomib IV 3.5 MG injection ?Commonly known as: VELCADE ?Inject into the vein every 14 (fourteen) days. ?  ?cetirizine 10 MG tablet ?Commonly known as: ZYRTEC ?Take 10 mg by mouth daily. ?  ?lenalidomide 10 MG capsule ?Commonly known as: REVLIMID ?TAKE 1 CAPSULE BY MOUTH DAILY  FOR 21 DAYS, THEN 7 DAYS OFF ?  ?LORazepam 1 MG tablet ?Commonly known as: ATIVAN ?Take 1 tablet (1 mg total) by  mouth every 4 (four) hours as needed. ?  ?valACYclovir 500 MG tablet ?Commonly known as: VALTREX ?Take 500 mg by mouth 2 (two) times daily. ?  ?zolpidem 10 MG tablet ?Commonly known as: AMBIEN ?Take 1 tablet (10 mg total) by mouth at bedtime as needed for sleep. ?  ? ?  ? ? ?Allergies: No Known Allergies ? ?Past Medical History, Surgical history, Social history, and Family History were reviewed and updated. ? ?Review of Systems: ?All other 10 point review of systems is negative.  ? ?Physical Exam: ? weight is 213 lb (96.6 kg). His oral temperature is 98 ?F (36.7 ?C). His blood pressure is 141/82 (abnormal) and his pulse is 60. His respiration is 18 and oxygen saturation is 100%.  ? ?Wt Readings from Last 3 Encounters:  ?08/28/21 213 lb (96.6 kg)  ?07/02/21 217 lb 1.9 oz (98.5 kg)  ?06/19/21 218 lb (98.9 kg)  ? ? ?Ocular: Sclerae unicteric, pupils equal, round and reactive to light ?Ear-nose-throat: Oropharynx clear, dentition fair ?Lymphatic: No cervical or supraclavicular adenopathy ?Lungs no rales or rhonchi, good excursion bilaterally ?Heart regular rate and rhythm, no murmur appreciated ?Abd soft, nontender, positive bowel sounds ?MSK no focal spinal tenderness, no joint edema ?Neuro: non-focal, well-oriented, appropriate affect ?Breasts: Deferred  ? ?Lab Results  ?Component Value Date  ?  WBC 2.4 (L) 08/28/2021  ? HGB 13.4 08/28/2021  ? HCT 38.0 (L) 08/28/2021  ? MCV 101.9 (H) 08/28/2021  ? PLT 139 (L) 08/28/2021  ? ?Lab Results  ?Component Value Date  ? FERRITIN 1,263 (H) 07/20/2014  ? IRON 125 07/20/2014  ? TIBC 198 (L) 07/20/2014  ? UIBC 73 (L) 07/20/2014  ? IRONPCTSAT 63 (H) 07/20/2014  ? ?Lab Results  ?Component Value Date  ? RETICCTPCT 0.8 07/20/2014  ? RBC 3.73 (L) 08/28/2021  ? ?Lab Results  ?Component Value Date  ? KPAFRELGTCHN 46.6 (H) 07/02/2021  ? LAMBDASER 32.5 (H) 07/02/2021  ? KAPLAMBRATIO 1.43 07/02/2021  ? ?Lab Results  ?Component Value Date  ? IGGSERUM 1,350 07/02/2021  ? IGA 238 07/02/2021  ?  IGMSERUM 22 07/02/2021  ? ?Lab Results  ?Component Value Date  ? TOTALPROTELP 7.0 07/02/2021  ? ALBUMINELP 4.1 07/02/2021  ? A1GS 0.2 07/02/2021  ? A2GS 0.6 07/02/2021  ? BETS 0.9 07/02/2021  ? BETA2SER 0.3 03/28/2015  ? GAMS 1.3 07/02/2021  ? MSPIKE Not Observed 07/02/2021  ? SPEI Comment 07/02/2021  ? ?  Chemistry   ?   ?Component Value Date/Time  ? NA 138 08/06/2021 0830  ? NA 140 04/22/2017 1140  ? NA 138 09/03/2015 1042  ? K 4.4 08/06/2021 0830  ? K 4.0 04/22/2017 1140  ? K 4.3 09/03/2015 1042  ? CL 107 08/06/2021 0830  ? CL 105 04/22/2017 1140  ? CO2 26 08/06/2021 0830  ? CO2 24 04/22/2017 1140  ? CO2 25 09/03/2015 1042  ? BUN 18 08/06/2021 0830  ? BUN 16 04/22/2017 1140  ? BUN 21.7 09/03/2015 1042  ? CREATININE 1.33 (H) 08/06/2021 0830  ? CREATININE 1.6 (H) 04/22/2017 1140  ? CREATININE 1.2 09/03/2015 1042  ?    ?Component Value Date/Time  ? CALCIUM 8.8 (L) 08/06/2021 0830  ? CALCIUM 8.5 04/22/2017 1140  ? CALCIUM 9.1 09/03/2015 1042  ? ALKPHOS 55 08/06/2021 0830  ? ALKPHOS 59 04/22/2017 1140  ? ALKPHOS 42 09/03/2015 1042  ? AST 25 08/06/2021 0830  ? AST 27 09/03/2015 1042  ? ALT 44 08/06/2021 0830  ? ALT 45 04/22/2017 1140  ? ALT 41 09/03/2015 1042  ? BILITOT 0.8 08/06/2021 0830  ? BILITOT 0.54 09/03/2015 1042  ?  ? ? ? ?Impression and Plan: [name] is a very pleasant 56 yo caucasian gentleman with IgG kappa myeloma. He underwent induction chemotherapy with RVD followed by an autologous stem cell transplant at [facility] on February 2017.  ?Protein studies are pending.  ?Velcade given as planned.  ?We will order his Kennieth Rad for his month long trip to [place]. MD to order.  ?He will contact his pharmacy regarding getting his next month supply a little early since he will be travelling.  ?Lab check and injection every 2 weeks and follow-up in 8 weeks.  ?GI referral placed.  ? ?Lottie Dawson, NP ?5/12/202310:03 AM ? ?

## 2021-08-28 NOTE — Patient Instructions (Signed)
Tyler Phillips  Discharge Instructions: ?Thank you for choosing [facility] to provide your oncology and hematology care.  ? ?If you have a lab appointment with the [facility], please go directly to the [facility] and check in at the registration area. ? ?Wear comfortable clothing and clothing appropriate for easy access to any Portacath or PICC line.  ? ?We strive to give you quality time with your provider. You may need to reschedule your appointment if you arrive late (15 or more minutes).  Arriving late affects you and other patients whose appointments are after yours.  Also, if you miss three or more appointments without notifying the office, you may be dismissed from the clinic at the provider?s discretion.    ?  ?For prescription refill requests, have your pharmacy contact our office and allow 72 hours for refills to be completed.   ? ?Today you received the following chemotherapy and/or immunotherapy agents Velcade    ?  ?To help prevent nausea and vomiting after your treatment, we encourage you to take your nausea medication as directed. ? ?BELOW ARE SYMPTOMS THAT SHOULD BE REPORTED IMMEDIATELY: ?*FEVER GREATER THAN 100.4 F (38 ?C) OR HIGHER ?*CHILLS OR SWEATING ?*NAUSEA AND VOMITING THAT IS NOT CONTROLLED WITH YOUR NAUSEA MEDICATION ?*UNUSUAL SHORTNESS OF BREATH ?*UNUSUAL BRUISING OR BLEEDING ?*URINARY PROBLEMS (pain or burning when urinating, or frequent urination) ?*BOWEL PROBLEMS (unusual diarrhea, constipation, pain near the anus) ?TENDERNESS IN MOUTH AND THROAT WITH OR WITHOUT PRESENCE OF ULCERS (sore throat, sores in mouth, or a toothache) ?UNUSUAL RASH, SWELLING OR PAIN  ?UNUSUAL VAGINAL DISCHARGE OR ITCHING  ? ?Items with * indicate a potential emergency and should be followed up as soon as possible or go to the Emergency Department if any problems should occur. ? ?Please show the CHEMOTHERAPY ALERT CARD or IMMUNOTHERAPY ALERT CARD at check-in to the  Emergency Department and triage nurse. ?Should you have questions after your visit or need to cancel or reschedule your appointment, please contact [facility]  [phone] and follow the prompts.  Office hours are 8:00 a.m. to 4:30 p.m. Monday - Friday. Please note that voicemails left after 4:00 p.m. may not be returned until the following business day.  We are closed weekends and major holidays. You have access to a nurse at all times for urgent questions. Please call the main number to the clinic [phone] and follow the prompts. ? ?For any non-urgent questions, you may also contact your provider using MyChart. We now offer e-Visits for anyone 8 and older to request care online for non-urgent symptoms. For details visit [email or web address]. ?  ?Also download the MyChart app! Go to the app store, search "MyChart", open the app, select [facility], and log in with your MyChart username and password. ? ?Due to Covid, a mask is required upon entering the hospital/clinic. If you do not have a mask, one will be given to you upon arrival. For doctor visits, patients may have 1 support person aged 91 or older with them. For treatment visits, patients cannot have anyone with them due to current Covid guidelines and our immunocompromised population.  ?

## 2021-08-29 LAB — IGG, IGA, IGM
IgA: 227 mg/dL (ref 90–386)
IgG (Immunoglobin G), Serum: 1300 mg/dL (ref 603–1613)
IgM (Immunoglobulin M), Srm: 20 mg/dL (ref 20–172)

## 2021-08-31 ENCOUNTER — Encounter: Payer: Self-pay | Admitting: Nurse Practitioner

## 2021-08-31 LAB — KAPPA/LAMBDA LIGHT CHAINS
Kappa free light chain: 47 mg/L — ABNORMAL HIGH (ref 3.3–19.4)
Kappa, lambda light chain ratio: 1.47 (ref 0.26–1.65)
Lambda free light chains: 31.9 mg/L — ABNORMAL HIGH (ref 5.7–26.3)

## 2021-09-01 ENCOUNTER — Other Ambulatory Visit: Payer: Self-pay | Admitting: Hematology & Oncology

## 2021-09-01 ENCOUNTER — Telehealth: Payer: Self-pay | Admitting: *Deleted

## 2021-09-01 DIAGNOSIS — C9002 Multiple myeloma in relapse: Secondary | ICD-10-CM

## 2021-09-01 NOTE — Telephone Encounter (Signed)
Per 08/28/21 los - called and lvm for call back to reschedule appointments ?

## 2021-09-02 ENCOUNTER — Telehealth: Payer: Self-pay

## 2021-09-02 LAB — PROTEIN ELECTROPHORESIS, SERUM
A/G Ratio: 1.3 (ref 0.7–1.7)
Albumin ELP: 3.9 g/dL (ref 2.9–4.4)
Alpha-1-Globulin: 0.2 g/dL (ref 0.0–0.4)
Alpha-2-Globulin: 0.6 g/dL (ref 0.4–1.0)
Beta Globulin: 0.9 g/dL (ref 0.7–1.3)
Gamma Globulin: 1.4 g/dL (ref 0.4–1.8)
Globulin, Total: 3 g/dL (ref 2.2–3.9)
Total Protein ELP: 6.9 g/dL (ref 6.0–8.5)

## 2021-09-02 NOTE — Telephone Encounter (Signed)
Per Judson Roch ok to refill Revlimid early due to pt going out of town for a month. Spoke with pt who states he spoke with [facility] and that he should be fine to request refill 1 week before as he is still in his window to make this request. Pt is leaving to go out of town 09/25/21 and will CB to make refill request 1 week prior. Also pt will need Ninlano # 3 prescribed at this time. Spoke with Judson Roch who states she made a note for Dr [name] to put this order in and is ok with him getting this prior to his trip.  ?

## 2021-09-03 ENCOUNTER — Other Ambulatory Visit: Payer: Self-pay | Admitting: Hematology & Oncology

## 2021-09-03 MED ORDER — IXAZOMIB CITRATE 4 MG PO CAPS: ORAL_CAPSULE | ORAL | 0 refills | Status: DC

## 2021-09-04 ENCOUNTER — Other Ambulatory Visit (HOSPITAL_COMMUNITY): Payer: Self-pay

## 2021-09-04 ENCOUNTER — Encounter: Payer: Self-pay | Admitting: Hematology & Oncology

## 2021-09-04 ENCOUNTER — Telehealth: Payer: Self-pay | Admitting: Pharmacist

## 2021-09-04 ENCOUNTER — Telehealth: Payer: Self-pay | Admitting: Pharmacy Technician

## 2021-09-04 DIAGNOSIS — C9001 Multiple myeloma in remission: Secondary | ICD-10-CM

## 2021-09-04 MED ORDER — IXAZOMIB CITRATE 4 MG PO CAPS: ORAL_CAPSULE | ORAL | 0 refills | Status: DC

## 2021-09-04 NOTE — Telephone Encounter (Addendum)
Oral Oncology Pharmacist Encounter  Received new prescription for Ninlaro (ixazomib) for the treatment of multiple myeloma in conjunction with Revlimid, Ninlaro will be used for 1 month while patient is on vacation.   CBC w/ Diff and CMP from 08/28/21 assessed, noted Scr 1.38 mg/dL (CrCL ~81.6 mL/min). No baseline dose adjustments required. Prescription dose and frequency assessed for appropriateness.  Current medication list in Epic reviewed, no relevant/significant DDIs with Ninlaro identified.  Evaluated chart and no patient barriers to medication adherence noted.   Prescription has been redirected to [facility] for dispensing as this is where patient is currently obtaining Revlimid through.   Oral Oncology Clinic will continue to follow for insurance authorization, copayment issues, initial counseling and start date.  Leron Croak, PharmD, BCPS Hematology/Oncology Clinical Pharmacist [facility] [phone] 09/04/2021 10:09 AM

## 2021-09-04 NOTE — Telephone Encounter (Signed)
Oral Oncology Patient Advocate Encounter   Received notification that prior authorization for Kennieth Rad is required.   PA submitted on 09/04/2021 [place] [id number] Status is pending     Lady Deutscher, CPhT-Adv Pharmacy Patient Advocate Specialist [facility] Patient Advocate Team Direct Number: [phone]  Fax: [phone]

## 2021-09-04 NOTE — Telephone Encounter (Signed)
Oral Oncology Patient Advocate Encounter  Prior Authorization for Kennieth Rad has been approved.    PA# [id number] Effective dates: 09/04/2021 through 04/18/2022   Lady Deutscher, [place] Patient Advocate Specialist [facility] Patient Advocate Team Direct Number: [phone]  Fax: [phone]

## 2021-09-08 ENCOUNTER — Telehealth: Payer: Self-pay

## 2021-09-08 NOTE — Telephone Encounter (Signed)
Called pt to confirm start date on ninlaro, patient to come in for his velcade on Friday 5/26 and will start his ninlaro on 6/9 and take every 2 weeks (he will be in [place] from 6/8 to July 10th) he will schedule his inj for 2 weeks after his last pill. He denies any other questions at this time and knows to call our office or our pharmacy if he has any questions regarding the medication.

## 2021-09-10 NOTE — Telephone Encounter (Signed)
Oral Chemotherapy Pharmacist Encounter  I spoke with patient for overview of: Ninlaro for the treatment of multiple myeloma in conjunction with Revlimid, Ninlaro will be used for 1 month while patient is on vacation in substitution of bortezomib injections patient receives in clinic every 14 days.   Counseled patient on administration, dosing, side effects, monitoring, drug-food interactions, safe handling, storage, and disposal.  Patient will take Ninlaro 4mg capsules, 1 capsule by mouth once every 14 days. Take on an empty stomach, 1 hour before or 2 hours after a meal. Patient will not be utilizing the traditional Ninlaro dosing, confirmed with Dr. Ennever on 09/10/21.  Ninlaro start date: 09/25/21  Adverse effects of Ninlaro include but are not limited to: peripheral edema, constipation, nausea, vomiting, decreased blood counts, and peripheral neuropathy.    Patient denied wanting anti-emetic on hand - states he has not had issues with nausea/vomiting in past when on Ninlaro  Reviewed with patient importance of keeping a medication schedule and plan for any missed doses. No barriers to medication adherence identified.  Medication reconciliation performed and medication/allergy list updated.  Patient remains on valacyclovir and takes aspirin 325 mg once daily.   All questions answered.  Mr. Gnau voiced understanding and appreciation.   Medication education handout placed in mail for patient. Patient knows to call the office with questions or concerns. Oral Chemotherapy Clinic phone number provided to patient.   Rebecca Fanning, PharmD, BCPS Hematology/Oncology Clinical Pharmacist [facility] Oral Chemotherapy Navigation Clinics [phone] 09/10/2021 12:55 PM  

## 2021-09-11 ENCOUNTER — Telehealth: Payer: Self-pay

## 2021-09-11 ENCOUNTER — Inpatient Hospital Stay: Payer: Medicare Other

## 2021-09-11 ENCOUNTER — Encounter: Payer: Self-pay | Admitting: *Deleted

## 2021-09-11 ENCOUNTER — Other Ambulatory Visit: Payer: Medicare Other

## 2021-09-11 ENCOUNTER — Ambulatory Visit: Payer: Medicare Other

## 2021-09-11 VITALS — BP 139/75 | HR 66 | Temp 98.0°F | Resp 18

## 2021-09-11 DIAGNOSIS — C9000 Multiple myeloma not having achieved remission: Secondary | ICD-10-CM | POA: Diagnosis not present

## 2021-09-11 DIAGNOSIS — C9001 Multiple myeloma in remission: Secondary | ICD-10-CM

## 2021-09-11 DIAGNOSIS — Z79899 Other long term (current) drug therapy: Secondary | ICD-10-CM | POA: Diagnosis not present

## 2021-09-11 DIAGNOSIS — Z5112 Encounter for antineoplastic immunotherapy: Secondary | ICD-10-CM | POA: Diagnosis not present

## 2021-09-11 LAB — CBC WITH DIFFERENTIAL (CANCER CENTER ONLY)
Abs Immature Granulocytes: 0 10*3/uL (ref 0.00–0.07)
Basophils Absolute: 0 10*3/uL (ref 0.0–0.1)
Basophils Relative: 0 %
Eosinophils Absolute: 0.1 10*3/uL (ref 0.0–0.5)
Eosinophils Relative: 2 %
HCT: 37.8 % — ABNORMAL LOW (ref 39.0–52.0)
Hemoglobin: 13.6 g/dL (ref 13.0–17.0)
Immature Granulocytes: 0 %
Lymphocytes Relative: 32 %
Lymphs Abs: 0.8 10*3/uL (ref 0.7–4.0)
MCH: 37.1 pg — ABNORMAL HIGH (ref 26.0–34.0)
MCHC: 36 g/dL (ref 30.0–36.0)
MCV: 103 fL — ABNORMAL HIGH (ref 80.0–100.0)
Monocytes Absolute: 0.1 10*3/uL (ref 0.1–1.0)
Monocytes Relative: 5 %
Neutro Abs: 1.5 10*3/uL — ABNORMAL LOW (ref 1.7–7.7)
Neutrophils Relative %: 61 %
Platelet Count: 147 10*3/uL — ABNORMAL LOW (ref 150–400)
RBC: 3.67 MIL/uL — ABNORMAL LOW (ref 4.22–5.81)
RDW: 13.1 % (ref 11.5–15.5)
WBC Count: 2.5 10*3/uL — ABNORMAL LOW (ref 4.0–10.5)
nRBC: 0 % (ref 0.0–0.2)

## 2021-09-11 LAB — CMP (CANCER CENTER ONLY)
ALT: 155 U/L — ABNORMAL HIGH (ref 0–44)
AST: 306 U/L (ref 15–41)
Albumin: 4.3 g/dL (ref 3.5–5.0)
Alkaline Phosphatase: 58 U/L (ref 38–126)
Anion gap: 5 (ref 5–15)
BUN: 16 mg/dL (ref 6–20)
CO2: 27 mmol/L (ref 22–32)
Calcium: 9.2 mg/dL (ref 8.9–10.3)
Chloride: 105 mmol/L (ref 98–111)
Creatinine: 1.4 mg/dL — ABNORMAL HIGH (ref 0.61–1.24)
GFR, Estimated: 59 mL/min — ABNORMAL LOW (ref >60)
Glucose, Bld: 73 mg/dL (ref 70–99)
Potassium: 4.2 mmol/L (ref 3.5–5.1)
Sodium: 137 mmol/L (ref 135–145)
Total Bilirubin: 0.9 mg/dL (ref 0.3–1.2)
Total Protein: 7.1 g/dL (ref 6.5–8.1)

## 2021-09-11 MED ORDER — BORTEZOMIB CHEMO SQ INJECTION 3.5 MG (2.5MG/ML)
3.0000 mg | Freq: Once | INTRAMUSCULAR | Status: DC
  Filled 2021-09-11: qty 1.2

## 2021-09-11 NOTE — Telephone Encounter (Signed)
Critical ast of 306 received from lab, md aware

## 2021-09-17 ENCOUNTER — Encounter: Payer: Self-pay | Admitting: Nurse Practitioner

## 2021-09-17 ENCOUNTER — Ambulatory Visit: Payer: Medicare Other | Admitting: Nurse Practitioner

## 2021-09-17 ENCOUNTER — Other Ambulatory Visit: Payer: Medicare Other

## 2021-09-17 VITALS — BP 142/98 | HR 74 | Ht 73.0 in | Wt 212.0 lb

## 2021-09-17 DIAGNOSIS — R195 Other fecal abnormalities: Secondary | ICD-10-CM | POA: Diagnosis not present

## 2021-09-17 NOTE — Progress Notes (Addendum)
Assessment   Patient profile:  Tyler Phillips. is a 56 y.o. male with a past medical history significant for multiple myeloma, ? Colon polyps. See PMH below for any additional history. Patient is new to the practice, referred for loose stool  Bowel changes. Chronic loose stool since starting Rivlimid 6 years ago for multiple myeloma. Over the last year the frequency of loose stool has increased and he has some associated urgency.  Symptoms significantly improved after starting a very low dose of imodium. He has no alarm symptoms such as blood in stool or weight loss.   ? History of colon polyps ( maybe in 2020). Patient is established with another GI practice.  We discussed the need for only one GI provider. We are happy to assume his care but need to get records.   Multiple myeloma s/p stem cell transplant in 2017. Currently treated with Velcade and Revlimid for last 6 years  Pancytopenia / multiple myeloma  Elevated liver enzymes.  ALT has been mildly elevated for quite some time ( < 2x ULN). [place] March 2021 for evaluation of elevated LFTs showed slight gallbladder wall thickening and a 6 mm gallbladder polyp. No follow up recommended per Radiology.  In reviewing recent labs ordered by Oncology I noticed that on 5/26 his AST rose from 24 to 306 and ALT from 46 to 156. He does Crossfit and AST can rise with strenuous exercise but doesn't explain the elevation of ALT.  I don't know that these labs have yet been addressed by Oncology.  I don't see that he recently started any new meds. Ninlaro prescribed but not yet started.    Addendum 11/03/21  Records received from [facility] gastroenterology: Screening colonoscopy 02/07/2018.  Complete exam.  Good bowel prep.  A 2 mm polyp was removed from the transverse colon.  The polyp was flat.  Multiple large diverticula found in the sigmoid and ascending colon.  Transverse colon polyp compatible with a tubular adenoma.  Will scan results into epic under  procedures  Patient has a follow-up appointment scheduled with Dr. Bryan Lemma the end of July.  He can decide about the timing of surveillance colonoscopy, probably 7 year interval exam.    Plan   He isn't totally avoiding gluten so will check tTg, IgA. Okay to use imodium as needed.  Will request last colonoscopy / path from GI ( He will get back to [place] on which group)  Follow up in a couple of months with Dr. Bryan Lemma. Depending on clinical course we may need to proceed with a diagnostic colonoscopy Call in the interim for worsening symptoms  Recommend Oncology repeat LFTs in a couple of weeks. 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 He has a history of multiple myeloma, status post autologous stem cell transplant at [facility] in 2017. On Velcade and Revlimid for ~ 6 years.   After patient started treatment for multiple myeloma six years ago his stools tended to be on the loose side and slightly more frequent. Over the last year or so it seems this has progressed. He feels like bowel changes are related to Revlimid but unsure why the progressive changes over the last year.  He may have have 5-6 loose BMs a day and sometimes they are associated with  significant urgency. On a good day ( which are becoming less frequent ), he may only have about 3 BMs a day.  He seldom has solid stools except for a day or two after getting Velcade.  He hasn't had any blood in his stools. No abdominal pain. His weight has been stable.   He usually avoids dairy as it gives him heartburn. He limits gluten because it also gives him heartburn. However, diary nor gluten seem to affect his bowels. He does know that eating a lot of veggies causes more frequent loose stools so he manages that appropriately.   Starchy food help  slow down the loose stools.  He recently started taking imodium which works well and he wants to know it if is okay to take. Just one dose  will reduce bowel movement frequency by half.   Tyler Phillips says he is up to date on his colonoscopy. He had a small polyp removed a few years back and says he was put on a 5 year recall. I doesn't remember who did his colonoscopy  A few weeks ago Tyler Phillips was bothered by a swollen hemorrhoid which he says has since resolved, maybe because he is having less loose stool oin imodium.   Previous Labs / Imaging::    Latest Ref Rng & Units 09/11/2021    9:07 AM 08/28/2021    9:38 AM 08/06/2021    8:30 AM  CBC  WBC 4.0 - 10.5 K/uL 2.5   2.4   2.5    Hemoglobin 13.0 - 17.0 g/dL 13.6   13.4   13.1    Hematocrit 39.0 - 52.0 % 37.8   38.0   36.5    Platelets 150 - 400 K/uL 147   139   134      No results found for: LIPASE    Latest Ref Rng & Units 09/11/2021    9:07 AM 08/28/2021    9:38 AM 08/06/2021    8:30 AM  CMP  Glucose 70 - 99 mg/dL 73   80   82    BUN 6 - 20 mg/dL '16   15   18    ' Creatinine 0.61 - 1.24 mg/dL 1.40   1.38   1.33    Sodium 135 - 145 mmol/L 137   137   138    Potassium 3.5 - 5.1 mmol/L 4.2   4.2   4.4    Chloride 98 - 111 mmol/L 105   106   107    CO2 22 - 32 mmol/L '27   24   26    ' Calcium 8.9 - 10.3 mg/dL 9.2   9.3   8.8    Total Protein 6.5 - 8.1 g/dL 7.1   6.9   7.0    Total Bilirubin 0.3 - 1.2 mg/dL 0.9   0.6   0.8    Alkaline Phos 38 - 126 U/L 58   61   55    AST 15 - 41 U/L 306   24   25    ALT 0 - 44 U/L 155   46   44       Previous GI Evaluations   Endoscopies: Colonoscopy with a different GI provider   Past Medical History:  Diagnosis Date   Anemia    Cancer ([place])    Elevated LFTs    Gallbladder polyp    Multiple myeloma ([facility])    Pneumonia    Past Surgical History:  Procedure Laterality  Date   auto stem cell transplant     Family History  Problem Relation Age of Onset   Prostate cancer Mother    COPD Father     Heart disease Brother    Leukemia Maternal Grandmother    Stomach cancer Neg Hx    Colon cancer Neg Hx    Esophageal cancer Neg Hx    Social History   Tobacco Use   Smoking status: Never   Smokeless tobacco: Never   Tobacco comments:    NEVER USED TOBACCO  Vaping Use   Vaping Use: Never used  Substance Use Topics   Alcohol use: Yes    Alcohol/week: 7.0 standard drinks    Types: 7 Glasses of wine per week    Comment: beer wine and liqour 2 drinks a night   Drug use: No   Current Outpatient Medications  Medication Sig Dispense Refill   aspirin 325 MG tablet Take 325 mg by mouth daily.     azithromycin (ZITHROMAX) 500 MG tablet      bortezomib IV (VELCADE) 3.5 MG injection Inject into the vein every 14 (fourteen) days.      cetirizine (ZYRTEC) 10 MG tablet Take 10 mg by mouth daily.     ixazomib citrate (NINLARO) 4 MG capsule Take 1 capsule (4 mg) by mouth weekly, 3 weeks on, 1 week off, repeat every 4 weeks. Take on an empty stomach 1hr before or 2hr after meals. 3 capsule 0   lenalidomide (REVLIMID) 10 MG capsule TAKE 1 CAPSULE BY MOUTH DAILY  FOR 21 DAYS, THEN 7 DAYS OFF 21 capsule 0   LORazepam (ATIVAN) 1 MG tablet Take 1 tablet (1 mg total) by mouth every 4 (four) hours as needed. 30 tablet 0   valACYclovir (VALTREX) 500 MG tablet Take 500 mg by mouth 2 (two) times daily.      zolpidem (AMBIEN) 10 MG tablet Take 1 tablet (10 mg total) by mouth at bedtime as needed for sleep. 30 tablet 0   No current facility-administered medications for this visit.   No Known Allergies   Review of Systems:  All systems reviewed and negative except where noted in HPI.   Physical Exam   Wt Readings from Last 3 Encounters:  09/17/21 212 lb (96.2 kg)  08/28/21 213 lb (96.6 kg)  07/02/21 217 lb 1.9 oz (98.5 kg)    BP (!) 142/98   Pulse 74   Ht '6\' 1"'  (1.854 m)   Wt 212 lb (96.2 kg)   BMI 27.97 kg/m  Constitutional:  Generally well appearing male in no acute  distress. Psychiatric: Pleasant. Normal mood and affect. Behavior is normal. EENT: Pupils normal.  Conjunctivae are normal. No scleral icterus. Neck supple.  Cardiovascular: Normal rate, regular rhythm. No edema Pulmonary/chest: Effort normal and breath sounds normal. No wheezing, rales or rhonchi. Abdominal: Soft, nondistended, nontender. Bowel sounds active throughout. There are no masses palpable. No hepatomegaly. Neurological: Alert and oriented to person place and time. Skin: Skin is warm and dry. No rashes noted.  Tyler Savoy, NP  09/17/2021, 3:04 PM  Cc:  Referring Provider Lottie Dawson, NP

## 2021-09-17 NOTE — Patient Instructions (Signed)
You have been scheduled for a follow up with Dr Bryan Lemma on Monday, 11/16/21 at 3:00 pm.  Please purchase the following medications over the counter and take as directed: Imodium 2 mg- Take 1 tablet 1-2 times daily as needed for loose stool/diarrhea  Your provider has requested that you go to the basement level for lab work before leaving today. Press "B" on the elevator. The lab is located at the first door on the left as you exit the elevator.  Please call our office back with contact information on your previous GI practice.  If you are age 22 or older, your body mass index should be between 23-30. Your Body mass index is 27.97 kg/m. If this is out of the aforementioned range listed, please consider follow up with your Primary Care Provider.  If you are age 91 or younger, your body mass index should be between 19-25. Your Body mass index is 27.97 kg/m. If this is out of the aformentioned range listed, please consider follow up with your Primary Care Provider.   ________________________________________________________  The [facility] providers would like to encourage you to use [facility] to communicate with providers for non-urgent requests or questions.  Due to long hold times on the telephone, sending your provider a message by [facility] may be a faster and more efficient way to get a response.  Please allow 48 business hours for a response.  Please remember that this is for non-urgent requests.  _______________________________________________________  Due to recent changes in healthcare laws, you may see the results of your imaging and laboratory studies on MyChart before your provider has had a chance to review them.  We understand that in some cases there may be results that are confusing or concerning to you. Not all laboratory results come back in the same time frame and the provider may be waiting for multiple results in order to interpret others.  Please give [place] 48 hours in order for your  provider to thoroughly review all the results before contacting the office for clarification of your results.

## 2021-09-18 ENCOUNTER — Inpatient Hospital Stay: Payer: Medicare Other

## 2021-09-18 ENCOUNTER — Other Ambulatory Visit: Payer: Self-pay | Admitting: *Deleted

## 2021-09-18 ENCOUNTER — Inpatient Hospital Stay: Payer: Medicare Other | Attending: Hematology & Oncology

## 2021-09-18 VITALS — BP 159/89 | HR 61 | Temp 97.9°F | Resp 18

## 2021-09-18 DIAGNOSIS — Z5112 Encounter for antineoplastic immunotherapy: Secondary | ICD-10-CM | POA: Insufficient documentation

## 2021-09-18 DIAGNOSIS — C9000 Multiple myeloma not having achieved remission: Secondary | ICD-10-CM | POA: Diagnosis not present

## 2021-09-18 DIAGNOSIS — C9002 Multiple myeloma in relapse: Secondary | ICD-10-CM

## 2021-09-18 DIAGNOSIS — C9001 Multiple myeloma in remission: Secondary | ICD-10-CM

## 2021-09-18 LAB — CMP (CANCER CENTER ONLY)
ALT: 112 U/L — ABNORMAL HIGH (ref 0–44)
AST: 43 U/L — ABNORMAL HIGH (ref 15–41)
Albumin: 4.2 g/dL (ref 3.5–5.0)
Alkaline Phosphatase: 53 U/L (ref 38–126)
Anion gap: 7 (ref 5–15)
BUN: 12 mg/dL (ref 6–20)
CO2: 24 mmol/L (ref 22–32)
Calcium: 8.8 mg/dL — ABNORMAL LOW (ref 8.9–10.3)
Chloride: 109 mmol/L (ref 98–111)
Creatinine: 1.32 mg/dL — ABNORMAL HIGH (ref 0.61–1.24)
GFR, Estimated: >60 mL/min (ref >60)
Glucose, Bld: 96 mg/dL (ref 70–99)
Potassium: 3.8 mmol/L (ref 3.5–5.1)
Sodium: 140 mmol/L (ref 135–145)
Total Bilirubin: 0.5 mg/dL (ref 0.3–1.2)
Total Protein: 6.9 g/dL (ref 6.5–8.1)

## 2021-09-18 LAB — CBC WITH DIFFERENTIAL (CANCER CENTER ONLY)
Abs Immature Granulocytes: 0.02 10*3/uL (ref 0.00–0.07)
Basophils Absolute: 0 10*3/uL (ref 0.0–0.1)
Basophils Relative: 1 %
Eosinophils Absolute: 0.1 10*3/uL (ref 0.0–0.5)
Eosinophils Relative: 3 %
HCT: 37.2 % — ABNORMAL LOW (ref 39.0–52.0)
Hemoglobin: 13.2 g/dL (ref 13.0–17.0)
Immature Granulocytes: 1 %
Lymphocytes Relative: 29 %
Lymphs Abs: 0.8 10*3/uL (ref 0.7–4.0)
MCH: 36.4 pg — ABNORMAL HIGH (ref 26.0–34.0)
MCHC: 35.5 g/dL (ref 30.0–36.0)
MCV: 102.5 fL — ABNORMAL HIGH (ref 80.0–100.0)
Monocytes Absolute: 0.3 10*3/uL (ref 0.1–1.0)
Monocytes Relative: 12 %
Neutro Abs: 1.5 10*3/uL — ABNORMAL LOW (ref 1.7–7.7)
Neutrophils Relative %: 54 %
Platelet Count: 132 10*3/uL — ABNORMAL LOW (ref 150–400)
RBC: 3.63 MIL/uL — ABNORMAL LOW (ref 4.22–5.81)
RDW: 13.2 % (ref 11.5–15.5)
Smear Review: NORMAL
WBC Count: 2.8 10*3/uL — ABNORMAL LOW (ref 4.0–10.5)
nRBC: 0 % (ref 0.0–0.2)

## 2021-09-18 LAB — TISSUE TRANSGLUTAMINASE, IGA: (tTG) Ab, IgA: <1 U/mL

## 2021-09-18 LAB — IGA: Immunoglobulin A: 221 mg/dL (ref 47–310)

## 2021-09-18 MED ORDER — LENALIDOMIDE 10 MG PO CAPS: ORAL_CAPSULE | ORAL | 0 refills | Status: DC

## 2021-09-18 MED ORDER — BORTEZOMIB CHEMO SQ INJECTION 3.5 MG (2.5MG/ML)
3.0000 mg | Freq: Once | INTRAMUSCULAR | Status: AC
  Administered 2021-09-18: 3 mg via SUBCUTANEOUS
  Filled 2021-09-18: qty 1.2

## 2021-09-18 NOTE — Patient Instructions (Signed)
[facility] [place]  Discharge Instructions: Thank you for choosing [facility] to provide your oncology and hematology care.   If you have a lab appointment with the [facility], please go directly to the [facility] and check in at the registration area.  Wear comfortable clothing and clothing appropriate for easy access to any Portacath or PICC line.   We strive to give you quality time with your provider. You may need to reschedule your appointment if you arrive late (15 or more minutes).  Arriving late affects you and other patients whose appointments are after yours.  Also, if you miss three or more appointments without notifying the office, you may be dismissed from the clinic at the provider's discretion.      For prescription refill requests, have your pharmacy contact our office and allow 72 hours for refills to be completed.    Today you received the following chemotherapy and/or immunotherapy agents Velcade      To help prevent nausea and vomiting after your treatment, we encourage you to take your nausea medication as directed.  BELOW ARE SYMPTOMS THAT SHOULD BE REPORTED IMMEDIATELY: *FEVER GREATER THAN 100.4 F (38 C) OR HIGHER *CHILLS OR SWEATING *NAUSEA AND VOMITING THAT IS NOT CONTROLLED WITH YOUR NAUSEA MEDICATION *UNUSUAL SHORTNESS OF BREATH *UNUSUAL BRUISING OR BLEEDING *URINARY PROBLEMS (pain or burning when urinating, or frequent urination) *BOWEL PROBLEMS (unusual diarrhea, constipation, pain near the anus) TENDERNESS IN MOUTH AND THROAT WITH OR WITHOUT PRESENCE OF ULCERS (sore throat, sores in mouth, or a toothache) UNUSUAL RASH, SWELLING OR PAIN  UNUSUAL VAGINAL DISCHARGE OR ITCHING   Items with * indicate a potential emergency and should be followed up as soon as possible or go to the Emergency Department if any problems should occur.  Please show the CHEMOTHERAPY ALERT CARD or IMMUNOTHERAPY ALERT CARD at check-in to the  Emergency Department and triage nurse. Should you have questions after your visit or need to cancel or reschedule your appointment, please contact [facility]  [phone] and follow the prompts.  Office hours are 8:00 a.m. to 4:30 p.m. Monday - Friday. Please note that voicemails left after 4:00 p.m. may not be returned until the following business day.  We are closed weekends and major holidays. You have access to a nurse at all times for urgent questions. Please call the main number to the clinic [phone] and follow the prompts.  For any non-urgent questions, you may also contact your provider using MyChart. We now offer e-Visits for anyone 70 and older to request care online for non-urgent symptoms. For details visit [email or web address].   Also download the MyChart app! Go to the app store, search "MyChart", open the app, select [facility], and log in with your MyChart username and password.  Due to Covid, a mask is required upon entering the hospital/clinic. If you do not have a mask, one will be given to you upon arrival. For doctor visits, patients may have 1 support person aged 67 or older with them. For treatment visits, patients cannot have anyone with them due to current Covid guidelines and our immunocompromised population.

## 2021-09-18 NOTE — Progress Notes (Signed)
Ok to treat with ALT 112 per Dr. [name]

## 2021-09-28 NOTE — Progress Notes (Signed)
Agree with the assessment and plan as outlined by Paula Guenther, NP. ° °Bronda Alfred, DO, FACG ° °

## 2021-10-02 ENCOUNTER — Other Ambulatory Visit: Payer: Self-pay | Admitting: Hematology & Oncology

## 2021-10-02 DIAGNOSIS — C9001 Multiple myeloma in remission: Secondary | ICD-10-CM

## 2021-10-02 DIAGNOSIS — R051 Acute cough: Secondary | ICD-10-CM | POA: Diagnosis not present

## 2021-10-02 DIAGNOSIS — C9000 Multiple myeloma not having achieved remission: Secondary | ICD-10-CM | POA: Diagnosis not present

## 2021-10-02 DIAGNOSIS — D849 Immunodeficiency, unspecified: Secondary | ICD-10-CM | POA: Diagnosis not present

## 2021-10-02 DIAGNOSIS — R059 Cough, unspecified: Secondary | ICD-10-CM | POA: Diagnosis not present

## 2021-10-02 DIAGNOSIS — Z20822 Contact with and (suspected) exposure to covid-19: Secondary | ICD-10-CM | POA: Diagnosis not present

## 2021-10-03 ENCOUNTER — Encounter: Payer: Self-pay | Admitting: Hematology & Oncology

## 2021-10-09 ENCOUNTER — Other Ambulatory Visit: Payer: Self-pay | Admitting: *Deleted

## 2021-10-09 DIAGNOSIS — C9002 Multiple myeloma in relapse: Secondary | ICD-10-CM

## 2021-10-09 MED ORDER — LENALIDOMIDE 10 MG PO CAPS: ORAL_CAPSULE | ORAL | 0 refills | Status: DC

## 2021-10-12 ENCOUNTER — Other Ambulatory Visit: Payer: Self-pay | Admitting: *Deleted

## 2021-10-12 DIAGNOSIS — C9002 Multiple myeloma in relapse: Secondary | ICD-10-CM

## 2021-10-12 MED ORDER — LENALIDOMIDE 10 MG PO CAPS: ORAL_CAPSULE | ORAL | 0 refills | Status: DC

## 2021-10-26 ENCOUNTER — Other Ambulatory Visit: Payer: Self-pay | Admitting: Hematology & Oncology

## 2021-10-30 ENCOUNTER — Inpatient Hospital Stay: Payer: Medicare Other

## 2021-10-30 ENCOUNTER — Inpatient Hospital Stay: Payer: Medicare Other | Admitting: Family

## 2021-10-30 ENCOUNTER — Ambulatory Visit: Payer: Medicare Other

## 2021-11-05 ENCOUNTER — Inpatient Hospital Stay: Payer: Medicare Other | Attending: Hematology & Oncology

## 2021-11-05 ENCOUNTER — Other Ambulatory Visit: Payer: Self-pay

## 2021-11-05 ENCOUNTER — Inpatient Hospital Stay: Payer: Medicare Other

## 2021-11-05 ENCOUNTER — Inpatient Hospital Stay (HOSPITAL_BASED_OUTPATIENT_CLINIC_OR_DEPARTMENT_OTHER): Payer: Medicare Other | Admitting: Family

## 2021-11-05 ENCOUNTER — Encounter: Payer: Self-pay | Admitting: Family

## 2021-11-05 VITALS — BP 137/84 | HR 71 | Temp 98.2°F | Resp 18 | Ht 73.0 in | Wt 214.0 lb

## 2021-11-05 DIAGNOSIS — Z5112 Encounter for antineoplastic immunotherapy: Secondary | ICD-10-CM | POA: Diagnosis not present

## 2021-11-05 DIAGNOSIS — C9001 Multiple myeloma in remission: Secondary | ICD-10-CM

## 2021-11-05 DIAGNOSIS — C9000 Multiple myeloma not having achieved remission: Secondary | ICD-10-CM | POA: Diagnosis not present

## 2021-11-05 LAB — CBC WITH DIFFERENTIAL (CANCER CENTER ONLY)
Abs Immature Granulocytes: 0.01 10*3/uL (ref 0.00–0.07)
Basophils Absolute: 0 10*3/uL (ref 0.0–0.1)
Basophils Relative: 1 %
Eosinophils Absolute: 0 10*3/uL (ref 0.0–0.5)
Eosinophils Relative: 1 %
HCT: 36 % — ABNORMAL LOW (ref 39.0–52.0)
Hemoglobin: 12.7 g/dL — ABNORMAL LOW (ref 13.0–17.0)
Immature Granulocytes: 0 %
Lymphocytes Relative: 31 %
Lymphs Abs: 0.8 10*3/uL (ref 0.7–4.0)
MCH: 36.1 pg — ABNORMAL HIGH (ref 26.0–34.0)
MCHC: 35.3 g/dL (ref 30.0–36.0)
MCV: 102.3 fL — ABNORMAL HIGH (ref 80.0–100.0)
Monocytes Absolute: 0.1 10*3/uL (ref 0.1–1.0)
Monocytes Relative: 4 %
Neutro Abs: 1.7 10*3/uL (ref 1.7–7.7)
Neutrophils Relative %: 63 %
Platelet Count: 140 10*3/uL — ABNORMAL LOW (ref 150–400)
RBC: 3.52 MIL/uL — ABNORMAL LOW (ref 4.22–5.81)
RDW: 13.4 % (ref 11.5–15.5)
Smear Review: NORMAL
WBC Count: 2.8 10*3/uL — ABNORMAL LOW (ref 4.0–10.5)
nRBC: 0 % (ref 0.0–0.2)

## 2021-11-05 LAB — CMP (CANCER CENTER ONLY)
ALT: 58 U/L — ABNORMAL HIGH (ref 0–44)
AST: 66 U/L — ABNORMAL HIGH (ref 15–41)
Albumin: 4.4 g/dL (ref 3.5–5.0)
Alkaline Phosphatase: 56 U/L (ref 38–126)
Anion gap: 9 (ref 5–15)
BUN: 11 mg/dL (ref 6–20)
CO2: 25 mmol/L (ref 22–32)
Calcium: 8.9 mg/dL (ref 8.9–10.3)
Chloride: 105 mmol/L (ref 98–111)
Creatinine: 1.43 mg/dL — ABNORMAL HIGH (ref 0.61–1.24)
GFR, Estimated: 58 mL/min — ABNORMAL LOW (ref >60)
Glucose, Bld: 132 mg/dL — ABNORMAL HIGH (ref 70–99)
Potassium: 3.8 mmol/L (ref 3.5–5.1)
Sodium: 139 mmol/L (ref 135–145)
Total Bilirubin: 0.7 mg/dL (ref 0.3–1.2)
Total Protein: 7 g/dL (ref 6.5–8.1)

## 2021-11-05 LAB — LACTATE DEHYDROGENASE: LDH: 160 U/L (ref 98–192)

## 2021-11-05 MED ORDER — BORTEZOMIB CHEMO SQ INJECTION 3.5 MG (2.5MG/ML)
3.0000 mg | Freq: Once | INTRAMUSCULAR | Status: AC
  Administered 2021-11-05: 3 mg via SUBCUTANEOUS
  Filled 2021-11-05: qty 1.2

## 2021-11-05 NOTE — Progress Notes (Signed)
Hematology and Oncology Follow Up Visit  Tyler Phillips [id number] November 11, 1965 56 y.o. 11/05/2021   Principle Diagnosis:  IgG Kappa myeloma - +4, +14, +17   Past Therapy: Status post autologous stem cell transplant on 05/22/2015   S/p cycle 6 of RVD Ninlaro 4 mg po q 2 week -- start on 01/02/2019 -- d/c on 02/10/2019   Current Therapy:        Velcade q 2wk dosing Revlimid '10mg'$  po q day (21/7)    Interim History:  [name] is her today for follow-up and treatment. He did have some nausea during the night after each dose of Ninlaro while on his trip to [place]. For future doses we will make sure that he has an antiemetic with him to prevent symptoms. He did not vomit.  IN May, M-spike was not detected, IgG level was 1,300 mg/dL and kappa light chains were 4.70 mg/dL.  No fever, chills, n/v, cough, rash, dizziness, SOB, chest pain, palpitations, abdominal pain or changes in bowel or bladder habits.  He ran over a nest of yellow jackets yesterday and go stung on the left flank, left eyelid and right arm. Thankfully the swelling has gone down and he states that he does not think he needs any steroid therapy.  Neuropathy in the hands and feet is unchanged from baseline.  He continues to work out regularly.  LFTs are stable at this time.  No falls or syncope reported.  Appetite and hydration are good. His weight is stable at 214 lbs.   ECOG Performance Status: 1 - Symptomatic but completely ambulatory  Medications:  Allergies as of 11/05/2021   No Known Allergies      Medication List        Accurate as of November 05, 2021 12:05 PM. If you have any questions, ask your nurse or doctor.          aspirin 325 MG tablet Take 325 mg by mouth daily.   azithromycin 500 MG tablet Commonly known as: ZITHROMAX   bortezomib IV 3.5 MG injection Commonly known as: VELCADE Inject into the vein every 14 (fourteen) days.   cetirizine 10 MG tablet Commonly known as: ZYRTEC Take 10 mg by  mouth daily.   lenalidomide 10 MG capsule Commonly known as: REVLIMID Take one tablet every day for three weeks on and then one week off-[employer] Auth #[id number]     Date Obtained 10/09/21   LORazepam 1 MG tablet Commonly known as: ATIVAN Take 1 tablet (1 mg total) by mouth every 4 (four) hours as needed.   Ninlaro 4 MG capsule Generic drug: ixazomib citrate TAKE 1 CAPSULE BY MOUTH ONCE ON  DAYS 1, 8, AND 15 OF A 28 DAY  CYCLE ON AN EMPTY STOMACH 1 HOUR BEFORE OR 2 HOURS AFTER A MEAL   valACYclovir 500 MG tablet Commonly known as: VALTREX Take 500 mg by mouth 2 (two) times daily.   zolpidem 10 MG tablet Commonly known as: AMBIEN Take 1 tablet (10 mg total) by mouth at bedtime as needed for sleep.        Allergies: No Known Allergies  Past Medical History, Surgical history, Social history, and Family History were reviewed and updated.  Review of Systems: All other 10 point review of systems is negative.   Physical Exam:  vitals were not taken for this visit.   Wt Readings from Last 3 Encounters:  09/17/21 212 lb (96.2 kg)  08/28/21 213 lb (96.6 kg)  07/02/21 217 lb 1.9  oz (98.5 kg)    Ocular: Sclerae unicteric, pupils equal, round and reactive to light Ear-nose-throat: Oropharynx clear, dentition fair Lymphatic: No cervical or supraclavicular adenopathy Lungs no rales or rhonchi, good excursion bilaterally Heart regular rate and rhythm, no murmur appreciated Abd soft, nontender, positive bowel sounds MSK no focal spinal tenderness, no joint edema Neuro: non-focal, well-oriented, appropriate affect Breasts: Deferred   Lab Results  Component Value Date   WBC 2.8 (L) 11/05/2021   HGB 12.7 (L) 11/05/2021   HCT 36.0 (L) 11/05/2021   MCV 102.3 (H) 11/05/2021   PLT 140 (L) 11/05/2021   Lab Results  Component Value Date   FERRITIN 1,263 (H) 07/20/2014   IRON 125 07/20/2014   TIBC 198 (L) 07/20/2014   UIBC 73 (L) 07/20/2014   IRONPCTSAT 63 (H) 07/20/2014   Lab  Results  Component Value Date   RETICCTPCT 0.8 07/20/2014   RBC 3.52 (L) 11/05/2021   Lab Results  Component Value Date   KPAFRELGTCHN 47.0 (H) 08/28/2021   LAMBDASER 31.9 (H) 08/28/2021   KAPLAMBRATIO 1.47 08/28/2021   Lab Results  Component Value Date   IGGSERUM 1,300 08/28/2021   IGA 227 08/28/2021   IGMSERUM 20 08/28/2021   Lab Results  Component Value Date   TOTALPROTELP 6.9 08/28/2021   ALBUMINELP 3.9 08/28/2021   A1GS 0.2 08/28/2021   A2GS 0.6 08/28/2021   BETS 0.9 08/28/2021   BETA2SER 0.3 03/28/2015   GAMS 1.4 08/28/2021   MSPIKE Not Observed 08/28/2021   SPEI Comment 08/28/2021     Chemistry      Component Value Date/Time   NA 140 09/18/2021 0929   NA 140 04/22/2017 1140   NA 138 09/03/2015 1042   K 3.8 09/18/2021 0929   K 4.0 04/22/2017 1140   K 4.3 09/03/2015 1042   CL 109 09/18/2021 0929   CL 105 04/22/2017 1140   CO2 24 09/18/2021 0929   CO2 24 04/22/2017 1140   CO2 25 09/03/2015 1042   BUN 12 09/18/2021 0929   BUN 16 04/22/2017 1140   BUN 21.7 09/03/2015 1042   CREATININE 1.32 (H) 09/18/2021 0929   CREATININE 1.6 (H) 04/22/2017 1140   CREATININE 1.2 09/03/2015 1042      Component Value Date/Time   CALCIUM 8.8 (L) 09/18/2021 0929   CALCIUM 8.5 04/22/2017 1140   CALCIUM 9.1 09/03/2015 1042   ALKPHOS 53 09/18/2021 0929   ALKPHOS 59 04/22/2017 1140   ALKPHOS 42 09/03/2015 1042   AST 43 (H) 09/18/2021 0929   AST 27 09/03/2015 1042   ALT 112 (H) 09/18/2021 0929   ALT 45 04/22/2017 1140   ALT 41 09/03/2015 1042   BILITOT 0.5 09/18/2021 0929   BILITOT 0.54 09/03/2015 1042       Impression and Plan: [name] is a very pleasant 56 yo caucasian gentleman with IgG kappa myeloma. He underwent induction chemotherapy with RVD followed by an autologous stem cell transplant at [facility] on February 2017.  We will proceed with treatment today as planned.  Protein studies are pending.  Lab and injection every 2 weeks, follow-up in 8 weeks.   Tyler Dawson, NP 7/20/202312:05 PM

## 2021-11-06 ENCOUNTER — Telehealth: Payer: Self-pay | Admitting: *Deleted

## 2021-11-06 LAB — IGG, IGA, IGM
IgA: 210 mg/dL (ref 90–386)
IgG (Immunoglobin G), Serum: 1409 mg/dL (ref 603–1613)
IgM (Immunoglobulin M), Srm: 20 mg/dL (ref 20–172)

## 2021-11-06 LAB — KAPPA/LAMBDA LIGHT CHAINS
Kappa free light chain: 46.8 mg/L — ABNORMAL HIGH (ref 3.3–19.4)
Kappa, lambda light chain ratio: 1.62 (ref 0.26–1.65)
Lambda free light chains: 28.9 mg/L — ABNORMAL HIGH (ref 5.7–26.3)

## 2021-11-06 NOTE — Telephone Encounter (Signed)
Per 11/05/21 los - called and lvm of upcoming appointments - requested call back to confirm

## 2021-11-09 LAB — PROTEIN ELECTROPHORESIS, SERUM
A/G Ratio: 1.4 (ref 0.7–1.7)
Albumin ELP: 3.9 g/dL (ref 2.9–4.4)
Alpha-1-Globulin: 0.1 g/dL (ref 0.0–0.4)
Alpha-2-Globulin: 0.5 g/dL (ref 0.4–1.0)
Beta Globulin: 0.8 g/dL (ref 0.7–1.3)
Gamma Globulin: 1.3 g/dL (ref 0.4–1.8)
Globulin, Total: 2.7 g/dL (ref 2.2–3.9)
Total Protein ELP: 6.6 g/dL (ref 6.0–8.5)

## 2021-11-10 ENCOUNTER — Telehealth: Payer: Self-pay

## 2021-11-10 DIAGNOSIS — D72819 Decreased white blood cell count, unspecified: Secondary | ICD-10-CM | POA: Diagnosis not present

## 2021-11-10 DIAGNOSIS — Z20822 Contact with and (suspected) exposure to covid-19: Secondary | ICD-10-CM | POA: Diagnosis not present

## 2021-11-10 DIAGNOSIS — R509 Fever, unspecified: Secondary | ICD-10-CM | POA: Diagnosis not present

## 2021-11-10 DIAGNOSIS — Z79899 Other long term (current) drug therapy: Secondary | ICD-10-CM | POA: Diagnosis not present

## 2021-11-10 DIAGNOSIS — Z9484 Stem cells transplant status: Secondary | ICD-10-CM | POA: Diagnosis not present

## 2021-11-10 NOTE — Telephone Encounter (Signed)
Patient called stating he is in [place] and has a fever so he went to the hospital to get checked out. He will call back once he gets an update. MD notified.

## 2021-11-12 ENCOUNTER — Other Ambulatory Visit: Payer: Self-pay

## 2021-11-12 ENCOUNTER — Inpatient Hospital Stay (HOSPITAL_COMMUNITY)
Admission: EM | Admit: 2021-11-12 | Discharge: 2021-11-16 | DRG: 809 | Disposition: A | Discharge status: Home / Self Care | Payer: Medicare Other | Attending: Internal Medicine | Admitting: Internal Medicine

## 2021-11-12 ENCOUNTER — Encounter (HOSPITAL_COMMUNITY): Payer: Self-pay

## 2021-11-12 ENCOUNTER — Emergency Department (HOSPITAL_COMMUNITY): Payer: Medicare Other

## 2021-11-12 ENCOUNTER — Telehealth: Payer: Self-pay | Admitting: *Deleted

## 2021-11-12 DIAGNOSIS — Z9484 Stem cells transplant status: Secondary | ICD-10-CM | POA: Diagnosis not present

## 2021-11-12 DIAGNOSIS — C9000 Multiple myeloma not having achieved remission: Secondary | ICD-10-CM | POA: Diagnosis present

## 2021-11-12 DIAGNOSIS — E860 Dehydration: Secondary | ICD-10-CM | POA: Diagnosis not present

## 2021-11-12 DIAGNOSIS — D61810 Antineoplastic chemotherapy induced pancytopenia: Principal | ICD-10-CM | POA: Diagnosis present

## 2021-11-12 DIAGNOSIS — Z79899 Other long term (current) drug therapy: Secondary | ICD-10-CM | POA: Diagnosis not present

## 2021-11-12 DIAGNOSIS — Z9481 Bone marrow transplant status: Secondary | ICD-10-CM | POA: Diagnosis not present

## 2021-11-12 DIAGNOSIS — E871 Hypo-osmolality and hyponatremia: Secondary | ICD-10-CM | POA: Diagnosis present

## 2021-11-12 DIAGNOSIS — D61818 Other pancytopenia: Secondary | ICD-10-CM

## 2021-11-12 DIAGNOSIS — N182 Chronic kidney disease, stage 2 (mild): Secondary | ICD-10-CM | POA: Diagnosis present

## 2021-11-12 DIAGNOSIS — Z8701 Personal history of pneumonia (recurrent): Secondary | ICD-10-CM | POA: Diagnosis not present

## 2021-11-12 DIAGNOSIS — Z0389 Encounter for observation for other suspected diseases and conditions ruled out: Secondary | ICD-10-CM | POA: Diagnosis not present

## 2021-11-12 DIAGNOSIS — E876 Hypokalemia: Secondary | ICD-10-CM | POA: Diagnosis present

## 2021-11-12 DIAGNOSIS — N1831 Chronic kidney disease, stage 3a: Secondary | ICD-10-CM | POA: Diagnosis not present

## 2021-11-12 DIAGNOSIS — D6959 Other secondary thrombocytopenia: Secondary | ICD-10-CM | POA: Diagnosis present

## 2021-11-12 DIAGNOSIS — C9590 Leukemia, unspecified not having achieved remission: Secondary | ICD-10-CM | POA: Diagnosis not present

## 2021-11-12 DIAGNOSIS — E8809 Other disorders of plasma-protein metabolism, not elsewhere classified: Secondary | ICD-10-CM | POA: Diagnosis present

## 2021-11-12 DIAGNOSIS — D709 Neutropenia, unspecified: Secondary | ICD-10-CM

## 2021-11-12 DIAGNOSIS — E872 Acidosis, unspecified: Secondary | ICD-10-CM | POA: Diagnosis not present

## 2021-11-12 DIAGNOSIS — R739 Hyperglycemia, unspecified: Secondary | ICD-10-CM | POA: Diagnosis present

## 2021-11-12 DIAGNOSIS — T451X5A Adverse effect of antineoplastic and immunosuppressive drugs, initial encounter: Secondary | ICD-10-CM | POA: Diagnosis not present

## 2021-11-12 DIAGNOSIS — Z20822 Contact with and (suspected) exposure to covid-19: Secondary | ICD-10-CM | POA: Diagnosis not present

## 2021-11-12 DIAGNOSIS — R509 Fever, unspecified: Secondary | ICD-10-CM

## 2021-11-12 DIAGNOSIS — D539 Nutritional anemia, unspecified: Secondary | ICD-10-CM | POA: Diagnosis present

## 2021-11-12 DIAGNOSIS — D701 Agranulocytosis secondary to cancer chemotherapy: Secondary | ICD-10-CM | POA: Diagnosis present

## 2021-11-12 DIAGNOSIS — N179 Acute kidney failure, unspecified: Secondary | ICD-10-CM | POA: Diagnosis present

## 2021-11-12 DIAGNOSIS — R5383 Other fatigue: Secondary | ICD-10-CM | POA: Diagnosis not present

## 2021-11-12 DIAGNOSIS — R5081 Fever presenting with conditions classified elsewhere: Secondary | ICD-10-CM | POA: Diagnosis present

## 2021-11-12 DIAGNOSIS — D696 Thrombocytopenia, unspecified: Secondary | ICD-10-CM | POA: Diagnosis not present

## 2021-11-12 DIAGNOSIS — R197 Diarrhea, unspecified: Secondary | ICD-10-CM | POA: Diagnosis present

## 2021-11-12 DIAGNOSIS — Z7982 Long term (current) use of aspirin: Secondary | ICD-10-CM

## 2021-11-12 DIAGNOSIS — R9431 Abnormal electrocardiogram [ECG] [EKG]: Secondary | ICD-10-CM | POA: Diagnosis not present

## 2021-11-12 DIAGNOSIS — R161 Splenomegaly, not elsewhere classified: Secondary | ICD-10-CM | POA: Diagnosis not present

## 2021-11-12 LAB — COMPREHENSIVE METABOLIC PANEL
ALT: 145 U/L — ABNORMAL HIGH (ref 0–44)
AST: 100 U/L — ABNORMAL HIGH (ref 15–41)
Albumin: 3.8 g/dL (ref 3.5–5.0)
Alkaline Phosphatase: 80 U/L (ref 38–126)
Anion gap: 9 (ref 5–15)
BUN: 15 mg/dL (ref 6–20)
CO2: 23 mmol/L (ref 22–32)
Calcium: 8.3 mg/dL — ABNORMAL LOW (ref 8.9–10.3)
Chloride: 101 mmol/L (ref 98–111)
Creatinine, Ser: 1.88 mg/dL — ABNORMAL HIGH (ref 0.61–1.24)
GFR, Estimated: 41 mL/min — ABNORMAL LOW (ref >60)
Glucose, Bld: 108 mg/dL — ABNORMAL HIGH (ref 70–99)
Potassium: 3.8 mmol/L (ref 3.5–5.1)
Sodium: 133 mmol/L — ABNORMAL LOW (ref 135–145)
Total Bilirubin: 1.2 mg/dL (ref 0.3–1.2)
Total Protein: 7.5 g/dL (ref 6.5–8.1)

## 2021-11-12 LAB — URINALYSIS, ROUTINE W REFLEX MICROSCOPIC
Bacteria, UA: NONE SEEN
Bilirubin Urine: NEGATIVE
Glucose, UA: NEGATIVE mg/dL
Ketones, ur: 5 mg/dL — AB
Leukocytes,Ua: NEGATIVE
Nitrite: NEGATIVE
Protein, ur: 100 mg/dL — AB
Specific Gravity, Urine: 1.017 (ref 1.005–1.030)
pH: 5 (ref 5.0–8.0)

## 2021-11-12 LAB — CBC WITH DIFFERENTIAL/PLATELET
Abs Immature Granulocytes: 0.14 10*3/uL — ABNORMAL HIGH (ref 0.00–0.07)
Basophils Absolute: 0 10*3/uL (ref 0.0–0.1)
Basophils Relative: 1 %
Eosinophils Absolute: 0 10*3/uL (ref 0.0–0.5)
Eosinophils Relative: 0 %
HCT: 35.6 % — ABNORMAL LOW (ref 39.0–52.0)
Hemoglobin: 12.8 g/dL — ABNORMAL LOW (ref 13.0–17.0)
Immature Granulocytes: 17 %
Lymphocytes Relative: 15 %
Lymphs Abs: 0.1 10*3/uL — ABNORMAL LOW (ref 0.7–4.0)
MCH: 36.2 pg — ABNORMAL HIGH (ref 26.0–34.0)
MCHC: 36 g/dL (ref 30.0–36.0)
MCV: 100.6 fL — ABNORMAL HIGH (ref 80.0–100.0)
Monocytes Absolute: 0.1 10*3/uL (ref 0.1–1.0)
Monocytes Relative: 13 %
Neutro Abs: 0.5 10*3/uL — ABNORMAL LOW (ref 1.7–7.7)
Neutrophils Relative %: 54 %
Platelets: 27 10*3/uL — CL (ref 150–400)
RBC: 3.54 MIL/uL — ABNORMAL LOW (ref 4.22–5.81)
RDW: 12.7 % (ref 11.5–15.5)
WBC Morphology: INCREASED
WBC: 0.9 10*3/uL — CL (ref 4.0–10.5)
nRBC: 0 % (ref 0.0–0.2)

## 2021-11-12 LAB — SARS CORONAVIRUS 2 BY RT PCR: SARS Coronavirus 2 by RT PCR: NEGATIVE

## 2021-11-12 LAB — LACTIC ACID, PLASMA: Lactic Acid, Venous: 1.6 mmol/L (ref 0.5–1.9)

## 2021-11-12 LAB — PROTIME-INR
INR: 1.1 (ref 0.8–1.2)
Prothrombin Time: 14 seconds (ref 11.4–15.2)

## 2021-11-12 LAB — APTT: aPTT: 28 seconds (ref 24–36)

## 2021-11-12 LAB — CBG MONITORING, ED: Glucose-Capillary: 94 mg/dL (ref 70–99)

## 2021-11-12 MED ORDER — ACETAMINOPHEN 325 MG PO TABS
650.0000 mg | ORAL_TABLET | Freq: Four times a day (QID) | ORAL | Status: DC | PRN
  Administered 2021-11-12: 650 mg via ORAL

## 2021-11-12 MED ORDER — SODIUM CHLORIDE 0.9 % IV BOLUS
1000.0000 mL | Freq: Once | INTRAVENOUS | Status: AC
Start: 2021-11-12 — End: 2021-11-12
  Administered 2021-11-12: 1000 mL via INTRAVENOUS

## 2021-11-12 MED ORDER — ACETAMINOPHEN 650 MG RE SUPP: 650.0000 mg | Freq: Four times a day (QID) | RECTAL | Status: DC | PRN

## 2021-11-12 MED ORDER — SODIUM CHLORIDE 0.9 % IV SOLN
2.0000 g | Freq: Two times a day (BID) | INTRAVENOUS | Status: DC
  Administered 2021-11-13 – 2021-11-14 (×3): 2 g via INTRAVENOUS
  Filled 2021-11-12 (×2): qty 12.5

## 2021-11-12 MED ORDER — SODIUM CHLORIDE 0.9 % IV BOLUS
1000.0000 mL | Freq: Once | INTRAVENOUS | Status: AC
  Administered 2021-11-12: 1000 mL via INTRAVENOUS

## 2021-11-12 MED ORDER — TBO-FILGRASTIM 480 MCG/0.8ML ~~LOC~~ SOSY
480.0000 ug | PREFILLED_SYRINGE | Freq: Once | SUBCUTANEOUS | Status: AC
  Administered 2021-11-12: 480 ug via SUBCUTANEOUS
  Filled 2021-11-12: qty 0.8

## 2021-11-12 MED ORDER — CEFEPIME HCL 2 G IV SOLR
2.0000 g | Freq: Once | INTRAVENOUS | Status: AC
  Administered 2021-11-12: 2 g via INTRAVENOUS
  Filled 2021-11-12: qty 12.5

## 2021-11-12 NOTE — Progress Notes (Signed)
Pharmacy Antibiotic Note  Tyler Phillips. is a 56 y.o. male with IgG multiple myeloma on velcade and Revlimid PTA who presented to the ED on 11/12/2021 with c/o fever, poor appetite and generalized weakness.  Pharmacy has been consulted to dose cefepime for broad empiric coverage.  Today, 11/12/2021: - ANC low 0.5 - scr 1.88 (crcl~53)  Plan: - cefepime 2gm IV q12h - f/u renal function, clinical status and culture results  ______________________________  Height: '6\' 1"'  (185.4 cm) Weight: 97.1 kg (214 lb) IBW/kg (Calculated) : 79.9  Temp (24hrs), Avg:101.1 F (38.4 C), Min:99.9 F (37.7 C), Max:102.3 F (39.1 C)  Recent Labs  Lab 11/12/21 1321  WBC 0.9*  CREATININE 1.88*  LATICACIDVEN 1.6    Estimated Creatinine Clearance: 53.2 mL/min (A) (by C-G formula based on SCr of 1.88 mg/dL (H)).    No Known Allergies   Thank you for allowing pharmacy to be a part of this patient's care.  Lynelle Doctor 11/12/2021 6:01 PM

## 2021-11-12 NOTE — Telephone Encounter (Signed)
Message received from patient stating that he is still not feeling well, is leaving the beach now and is heading straight to the ER at [facility].  Call placed back to patient and he states that he felt like he "crashed last night, hardly has enough energy to stand and has been taking Tylenol ATC. He states that he is approximately 3 hours away from the [facility] ER and is planning on going straight to that ER.  He does state that his wife is driving. Informed pt that I would let Dr. [name] know that he is headed to the [facility] ER. Dr. [name] notified.

## 2021-11-12 NOTE — H&P (Signed)
History and Physical    Patient: Tyler Phillips. UMP:[id number] DOB: March 04, 1966 DOA: 11/12/2021 DOS: the patient was seen and examined on 11/12/2021 PCP: [name], MD  Patient coming from: Home  Chief Complaint:  Chief Complaint  Patient presents with   Abnormal Lab   HPI: Tyler Phillips. is a 56 y.o. male with medical history significant for but not limited to abnormal LFTs, IgG kappa myeloma status post induction chemotherapy with RVD followed by an autologous stem cell transplant at [facility] was currently undergoing chemotherapy treatment as well as a history of anemia and other comorbidities who presented with a chief complaint of significant weakness, fatigue and fever.  Patient had recently gone to the beach and started feeling bad Saturday evening and then felt bad Sunday morning and Monday and then went to the local ED where they found him to be neutropenic.  They offered him admission but he declined and they sent him home with antibiotics with Levaquin and they give him a shot of Neupogen.  Patient started feeling better yesterday but then this morning when he woke up he felt terrible and drove directly from the beach to [facility] ED.  Patient sees Dr. [name] as his hematologist oncologist and hematology oncology were contacted and Dr. [name] recommended placing the patient on IV cefepime as well as giving him a dose of Granix tonight.  Patient continues to feel better but denies any nausea or real vomiting.  States that he has some slight diarrhea but thinks it is from not from eating properly.  His appetite been extremely poor he is not eating very much the last few days.  He denies any burning or discomfort in his urine, chest pain or shortness of breath.  Patient states that he just does not feel good and feels extremely weak.  Upon arrival to the ED patient was afebrile but patient states that he has been subjectively febrile and not been able to take his temperature given that [place]  [place] staying and did not have a thermometer.  Patient has been taking acetaminophen though as an outpatient.  Further work-up in the ED showed that labs that he was pancytopenic and that his neutrophil count was 500.  Patient was also noted to have abnormal LFTs, and a elevated creatinine up from a week ago where it was 1.43.  Patient is being admitted for neutropenic fever and pancytopenia with acute on chronic kidney disease and abnormal LFTs.   Review of Systems: As mentioned in the history of present illness. All other systems reviewed and are negative. Past Medical History:  Diagnosis Date   Anemia    Cancer ([place])    Elevated LFTs    Gallbladder polyp    Multiple myeloma ([place])    Pneumonia    Past Surgical History:  Procedure Laterality Date   auto stem cell transplant     Social History:  reports that he has never smoked. He has never used smokeless tobacco. He reports current alcohol use of about 7.0 standard drinks of alcohol per week. He reports that he does not use drugs.  No Known Allergies  Family History  Problem Relation Age of Onset   Prostate cancer Mother    COPD Father    Heart disease Brother    Leukemia Maternal Grandmother    Stomach cancer Neg Hx    Colon cancer Neg Hx    Esophageal cancer Neg Hx     Prior to Admission medications  Medication Sig Start Date End Date Taking? Authorizing Provider  aspirin 325 MG tablet Take 325 mg by mouth daily.   Yes [provider]  azithromycin (ZITHROMAX) 500 MG tablet Take 500 mg by mouth daily as needed (For pre dental appointments).   Yes [provider]  bortezomib IV (VELCADE) 3.5 MG injection Inject 1 mg/m2 into the vein every 14 (fourteen) days.   Yes [provider]  cetirizine (ZYRTEC) 10 MG tablet Take 10 mg by mouth daily.   Yes [provider]  lenalidomide (REVLIMID) 10 MG capsule Take one tablet every day for three weeks on and then one week  off-[employer] Auth #[id number]     Date Obtained 10/09/21 Patient taking differently: Take 10 mg by mouth every evening. 10/12/21  Yes Ennever, Rudell Cobb, MD  levofloxacin (LEVAQUIN) 750 MG tablet Take 750 mg by mouth daily. 11/10/21  Yes [provider]  valACYclovir (VALTREX) 500 MG tablet Take 500 mg by mouth 2 (two) times daily.    Yes [provider]  LORazepam (ATIVAN) 1 MG tablet Take 1 tablet (1 mg total) by mouth every 4 (four) hours as needed. Patient not taking: Reported on 11/12/2021 01/15/19   [name], MD  NINLARO 4 MG capsule TAKE 1 CAPSULE BY MOUTH ONCE ON  DAYS 1, 8, AND 15 OF A 28 DAY  CYCLE ON AN EMPTY STOMACH 1 HOUR BEFORE OR 2 HOURS AFTER A MEAL Patient not taking: Reported on 11/12/2021 10/03/21   [name], MD  zolpidem (AMBIEN) 10 MG tablet Take 1 tablet (10 mg total) by mouth at bedtime as needed for sleep. 01/15/19 08/28/21  [name], MD    Physical Exam: Vitals:   11/12/21 1600 11/12/21 1630 11/12/21 1700 11/12/21 1705  BP: 127/89 100/71 119/74   Pulse: 87 84 87   Resp: (!) _0 Temp:    (!) 102.3 F (39.1 C)  TempSrc:    Oral  SpO2: 97% 93% 100%   Weight:      Height:       Examination: Physical Exam:  Constitutional: WN/WD overweight Caucasian male currently no acute distress appears fatigued Respiratory: Diminished to auscultation bilaterally, no wheezing, rales, rhonchi or crackles. Normal respiratory effort and patient is not tachypenic. No accessory muscle use.  Unlabored breathing Cardiovascular: RRR, no murmurs / rubs / gallops. S1 and S2 auscultated. No extremity edema. Abdomen: Soft, non-tender, distended secondary body habitus. Bowel sounds positive.  GU: Deferred. Musculoskeletal: No clubbing / cyanosis of digits/nails. Normal strength and muscle tone.  Skin: No rashes, lesions, ulcers unlabored. No induration; Warm and dry.  Neurologic: CN 2-12 grossly intact with no focal deficits.. Romberg sign cerebellar  reflexes not assessed.  Psychiatric: Normal judgment and insight. Alert and oriented x 3. Normal mood and appropriate affect.   Data Reviewed:  Lab results were reviewed independently and showed a sodium of 133, glucose of 108, BUN/creatinine of 15/1.88, LFTs with showed an AST of 100 and ALT of 145 and a pancytopenia worse than his baseline with a WBC of 0.9, hemoglobin/hematocrit of 12.8/35.6, and platelet count of 27.  Urinalysis was reviewed and was not really remarkable for infection.  Blood cultures x2 and urine cultures pending.  Chest x-ray done and reviewed showed no acute cardiopulmonary disease.  EKG done and reviewed personally and independently showed a sinus rhythm with a QTc of 463 and left axis deviation with no evidence of ST elevation or depression on my interpretation  Assessment and Plan:  Neutropenic fever Pancytopenia -Patient presents with subjective fevers and is significantly pancytopenic now after his chemotherapy -Patient has a history of IgG kappa myeloma and is currently on chemotherapy and received it last week with Velcade and Revlimid; he is status post induction chemotherapy with RVD followed by autologous stem cell transplant at [facility] in February 2017 -Presents with subjective fevers, and WBC count of 0.9 and a neutrophil count of 500 -We will give him IV fluid hydration and he received 1 L bolus in the ED which we will repeat and will place on maintenance IV fluids with normal saline at 100 MLS per hour -Continue with antipyretics -Blood cultures x2 have been obtained -Patient has a worsening pancytopenia as his WBC has gone from 2.8 is now 0.9, hemoglobin/hematocrit has gone from 12.7/36.0 on last check is now 12.8/35.6 and platelet count has significantly dropped from 140 -> 27 -Urinalysis not really remarkable for infection and chest x-ray showed no acute cardiopulmonary disease -Unclear etiology of where his fever and source is but he did state that he had a  little bit of diarrhea.  Patient admits to having very poor p.o. intake last few days. -Empirically started IV cefepime which we will continue and patient is to get Granix tonight  IgG kappa myeloma -Hold his chemotherapy and hematology oncology has been notified -Patient sees Dr. [name] in the outpatient setting and recently had chemotherapy last week  AKI on CKD stage IIIa -Patient's BUNs/creatinine went from 11/1.43 is now 15/1.88 -Given IV fluid hydration with 2 L boluses and started on maintenance IV fluid hydration with normal saline at 100 MLS per hour -We will need to avoid nephrotoxic medications, contrast dyes, hypotension and dehydration to ensure adequate renal perfusion and will need to renally adjust medications -Repeat CMP in the a.m.  Abnormal LFTs -AST and ALT have worsened but he does have a history of abnormal LFTs -AST went from 66 on last check is now 100 -ALT went from 58 and is now 145  Hyperglycemia -Patient's blood sugar on admission was 108 -Check hemoglobin A1c -Continue to monitor blood sugars carefully and if necessary will need to place on sensitive NovoLog/scale insulin  Hyponatremia -Likely in the setting of his dehydration -Patient's sodium is now 133 -Started IV fluid hydration as above We will continue to monitor and trend and repeat CMP in a.m.  Advance Care Planning:   Code Status: Prior   Consults: EDP discussed with hematology oncology Dr. [name]  Family Communication: Discussed with wife at bedside  Severity of Illness: The appropriate patient status for this patient is INPATIENT. Inpatient status is judged to be reasonable and necessary in order to provide the required intensity of service to ensure the patient's safety. The patient's presenting symptoms, physical exam findings, and initial radiographic and laboratory data in the context of their chronic comorbidities is felt to place them at high risk for further clinical deterioration.  Furthermore, it is not anticipated that the patient will be medically stable for discharge from the hospital within 2 midnights of admission.   * I certify that at the point of admission it is my clinical judgment that the patient will require inpatient hospital care spanning beyond 2 midnights from the point of admission due to high intensity of service, high risk for further deterioration and high frequency of surveillance required.*  Author: [name], DO 11/12/2021 5:41 PM  For on call review [email or web address].

## 2021-11-12 NOTE — Progress Notes (Signed)
A consult was received from an ED physician for cefepime per pharmacy dosing.  The patient's profile has been reviewed for ht/wt/allergies/indication/available labs.    A one time order has been placed for cefepime 2gm IV x1.  Further antibiotics/pharmacy consults should be ordered by admitting physician if indicated.                       Thank you, Lynelle Doctor 11/12/2021  4:03 PM

## 2021-11-12 NOTE — ED Provider Notes (Signed)
[facility] DEPT Provider Note   CSN: [id number] Arrival date & time: 11/12/21  1248     History  Chief Complaint  Patient presents with   Abnormal Lab    Audi Phillips. is a 56 y.o. male.  Patient with h/o IgG Kappa myeloma, s/p autologous stem cell transplant 05/2015, current receiving treatment with Velcade q 2wk dosing (last infusion 11/05/21), Revlimid '10mg'$  po q day -- presents to the ED for approximately 5 days subjective fevers, lack of appetite, malaise.  He was at the beach when he started to feel poorly.  He has had intense thirst but no nausea, vomiting, or diarrhea.  No headache, vision change or neck pain.  No URI symptoms or sore throat.  No cough or shortness of breath.  No focal abdominal pain.  Does report having some mild pain in his "kidney area" that he relates to having been lying down for a long time.  No skin rashes or sores in the mouth.  He did have a tick bite him about 2 weeks ago on his left thigh but does not think that it was on for that long.  Denies associated rash in the area of the bite.  He did go to [facility] 2 days ago.  They recommended admission, however patient went home after they contacted his oncologist, Dr. [name].  Patient was given a dose of Neupogen for leukopenia and started on empiric Levaquin.  He states that he thought he was feeling better and "turning a corner" yesterday but again worsened this morning.  They drove right from the beach to the emergency department.       Home Medications Prior to Admission medications   Medication Sig Start Date End Date Taking? Authorizing Provider  aspirin 325 MG tablet Take 325 mg by mouth daily.    [provider]  azithromycin (ZITHROMAX) 500 MG tablet     [provider]  bortezomib IV (VELCADE) 3.5 MG injection Inject into the vein every 14 (fourteen) days.     [provider]  cetirizine (ZYRTEC) 10 MG tablet Take 10 mg by  mouth daily.    [provider]  lenalidomide (REVLIMID) 10 MG capsule Take one tablet every day for three weeks on and then one week off-[employer] Auth #[id number]     Date Obtained 10/09/21 10/12/21   [name], MD  LORazepam (ATIVAN) 1 MG tablet Take 1 tablet (1 mg total) by mouth every 4 (four) hours as needed. 01/15/19   Ennever, Rudell Cobb, MD  NINLARO 4 MG capsule TAKE 1 CAPSULE BY MOUTH ONCE ON  DAYS 1, 8, AND 15 OF A 28 DAY  CYCLE ON AN EMPTY STOMACH 1 HOUR BEFORE OR 2 HOURS AFTER A MEAL 10/03/21   Ennever, Rudell Cobb, MD  valACYclovir (VALTREX) 500 MG tablet Take 500 mg by mouth 2 (two) times daily.     [provider]  zolpidem (AMBIEN) 10 MG tablet Take 1 tablet (10 mg total) by mouth at bedtime as needed for sleep. 01/15/19 08/28/21  [name], MD      Allergies    Patient has no known allergies.    Review of Systems   Review of Systems  Physical Exam Updated Vital Signs BP 123/75 (BP Location: Left Arm)   Pulse 93   Temp 99.9 F (37.7 C) (Oral)   Resp 18   Ht '6\' 1"'$  (1.854 m)   Wt 97.1 kg   SpO2 100%  BMI 28.23 kg/m   Physical Exam Vitals and nursing note reviewed.  Constitutional:      General: He is not in acute distress.    Appearance: He is well-developed. He is ill-appearing.  HENT:     Head: Normocephalic and atraumatic.     Right Ear: External ear normal.     Left Ear: External ear normal.     Nose: Nose normal.     Mouth/Throat:     Mouth: Mucous membranes are moist.     Comments: No intraoral lesions. Eyes:     General:        Right eye: No discharge.        Left eye: No discharge.     Conjunctiva/sclera: Conjunctivae normal.  Cardiovascular:     Rate and Rhythm: Normal rate and regular rhythm.     Heart sounds: Normal heart sounds.  Pulmonary:     Effort: Pulmonary effort is normal.     Breath sounds: Normal breath sounds.  Abdominal:     Palpations: Abdomen is soft.     Tenderness: There is no abdominal tenderness. There is  no guarding or rebound.  Musculoskeletal:     Cervical back: Normal range of motion and neck supple.     Right lower leg: No edema.     Left lower leg: No edema.  Skin:    General: Skin is warm and dry.     Findings: No rash.  Neurological:     General: No focal deficit present.     Mental Status: He is alert.  Psychiatric:        Mood and Affect: Mood normal.     ED Results / Procedures / Treatments   Labs (all labs ordered are listed, but only abnormal results are displayed) Labs Reviewed  COMPREHENSIVE METABOLIC PANEL - Abnormal; Notable for the following components:      Result Value   Sodium 133 (*)    Glucose, Bld 108 (*)    Creatinine, Ser 1.88 (*)    Calcium 8.3 (*)    AST 100 (*)    ALT 145 (*)    GFR, Estimated 41 (*)    All other components within normal limits  CBC WITH DIFFERENTIAL/PLATELET - Abnormal; Notable for the following components:   WBC 0.9 (*)    RBC 3.54 (*)    Hemoglobin 12.8 (*)    HCT 35.6 (*)    MCV 100.6 (*)    MCH 36.2 (*)    Platelets 27 (*)    All other components within normal limits  URINALYSIS, ROUTINE W REFLEX MICROSCOPIC - Abnormal; Notable for the following components:   Hgb urine dipstick SMALL (*)    Ketones, ur 5 (*)    Protein, ur 100 (*)    All other components within normal limits  SARS CORONAVIRUS 2 BY RT PCR  CULTURE, BLOOD (ROUTINE X 2)  CULTURE, BLOOD (ROUTINE X 2)  URINE CULTURE  LACTIC ACID, PLASMA  PROTIME-INR  APTT  LACTIC ACID, PLASMA  ROCKY MTN SPOTTED FVR ABS PNL(IGG+IGM)  CBG MONITORING, ED    ED ECG REPORT   Date: 11/12/2021  Rate: 94  Rhythm: normal sinus rhythm  QRS Axis: left  Intervals: normal  ST/T Wave abnormalities: normal  Conduction Disutrbances:none  Narrative Interpretation:   Old EKG Reviewed: changes noted, left axis today  I have personally reviewed the EKG tracing and agree with the computerized printout as noted.   Radiology DG Chest 2 View  Result  Date:  11/12/2021 CLINICAL DATA:  Questionable sepsis - evaluate for abnormality EXAM: CHEST - 2 VIEW COMPARISON:  04/10/2020 FINDINGS: The heart size and mediastinal contours are within normal limits. No focal airspace consolidation, pleural effusion, or pneumothorax. The visualized skeletal structures are unremarkable. IMPRESSION: No active cardiopulmonary disease. Electronically Signed   By: Davina Poke D.O.   On: 11/12/2021 14:12    Procedures Procedures    Medications Ordered in ED Medications  Tbo-Filgrastim (GRANIX) injection 480 mcg (has no administration in time range)  ceFEPIme (MAXIPIME) 2 g in sodium chloride 0.9 % 100 mL IVPB (has no administration in time range)  sodium chloride 0.9 % bolus 1,000 mL (0 mLs Intravenous Stopped 11/12/21 1446)    ED Course/ Medical Decision Making/ A&P    Patient seen and examined. History obtained directly from patient.  Reviewed external notes from ED visit at [facility] and oncology clinic notes.  Labs/EKG: Ordered CBC, CMP, lactate, PT/INR, UA, RMSF titer.  Imaging: Chest x-ray.  Medications/Fluids: Ordered: IV fluid bolus.  Most recent vital signs reviewed and are as follows: BP (!) 143/75   Pulse 88   Temp 99.9 F (37.7 C) (Oral)   Resp 19   Ht '6\' 1"'$  (1.854 m)   Wt 97.1 kg   SpO2 99%   BMI 28.23 kg/m   Initial impression: Generalized weakness, fever, neutropenia  3:58 PM Reassessment performed. Patient appears comfortable, unchanged.  Labs personally reviewed and interpreted including: CBC with white blood cell count 0.9, hemoglobin 12.8, platelet count 27, neutrophils pending; CMP sodium 133, glucose 108, creatinine above baseline at 1.88 today with minimally elevated liver function testing; lactate 1.6; INR 1.1, APTT 28; UA does not appear infected.  Imaging personally visualized and interpreted including: Chest x-ray agree negative.  I consulted with Dr. [name] of hematology oncology by telephone.  Recommends IV  cefepime (vancomycin/cefepime if port, but patient does not have port), Granix 480 mcg.  Agrees culture urine and blood.  They will consult in the morning.  I consulted with Dr. Alfredia Ferguson [facility] hospitalist who will see.  Reviewed pertinent lab work and imaging with patient at bedside. Questions answered.   Most current vital signs reviewed and are as follows: BP 127/80   Pulse 83   Temp 99.9 F (37.7 C) (Oral)   Resp (!) 21   Ht '6\' 1"'$  (1.854 m)   Wt 97.1 kg   SpO2 100%   BMI 28.23 kg/m   Plan: Admit for neutropenia, subjective fevers, thrombocytopenia  CRITICAL CARE Performed by: Carlisle Cater PA-C Total critical care time: 40 minutes Critical care time was exclusive of separately billable procedures and treating other patients. Critical care was necessary to treat or prevent imminent or life-threatening deterioration. Critical care was time spent personally by me on the following activities: development of treatment plan with patient and/or surrogate as well as nursing, discussions with consultants, evaluation of patient's response to treatment, examination of patient, obtaining history from patient or surrogate, ordering and performing treatments and interventions, ordering and review of laboratory studies, ordering and review of radiographic studies, pulse oximetry and re-evaluation of patient's condition.                           Medical Decision Making Amount and/or Complexity of Data Reviewed Labs: ordered. Radiology: ordered. ECG/medicine tests: ordered.  Risk Prescription drug management.   Patient with neutropenia and thrombocytopenia in setting of chemotherapy, myeloma.  Subjective fevers and concern for  infection.  No focal infection.  No obvious pneumonia, UTI, cellulitis on work-up to this point.  Abdomen is soft and nontender.          Final Clinical Impression(s) / ED Diagnoses Final diagnoses:  Chemotherapy-induced neutropenia (HCC)  Thrombocytopenia  ([place])  Subjective fever    Rx / DC Orders ED Discharge Orders     None         Carlisle Cater, PA-C 11/12/21 [place], [place], DO 11/13/21 [phone]

## 2021-11-12 NOTE — ED Triage Notes (Signed)
Patient states he had a WBC 1.2 2 days ago. Patient was ordered Neuprogen and an antibiotic. Patient c/o weakness, chills, and fever,

## 2021-11-13 ENCOUNTER — Inpatient Hospital Stay (HOSPITAL_COMMUNITY): Payer: Medicare Other

## 2021-11-13 ENCOUNTER — Encounter (HOSPITAL_COMMUNITY): Payer: Self-pay | Admitting: Internal Medicine

## 2021-11-13 DIAGNOSIS — D539 Nutritional anemia, unspecified: Secondary | ICD-10-CM

## 2021-11-13 DIAGNOSIS — N179 Acute kidney failure, unspecified: Secondary | ICD-10-CM | POA: Diagnosis not present

## 2021-11-13 DIAGNOSIS — R5081 Fever presenting with conditions classified elsewhere: Secondary | ICD-10-CM | POA: Diagnosis not present

## 2021-11-13 DIAGNOSIS — R5383 Other fatigue: Secondary | ICD-10-CM

## 2021-11-13 DIAGNOSIS — Z9481 Bone marrow transplant status: Secondary | ICD-10-CM

## 2021-11-13 DIAGNOSIS — T451X5A Adverse effect of antineoplastic and immunosuppressive drugs, initial encounter: Secondary | ICD-10-CM

## 2021-11-13 DIAGNOSIS — C9000 Multiple myeloma not having achieved remission: Secondary | ICD-10-CM

## 2021-11-13 DIAGNOSIS — D709 Neutropenia, unspecified: Secondary | ICD-10-CM | POA: Diagnosis not present

## 2021-11-13 DIAGNOSIS — N182 Chronic kidney disease, stage 2 (mild): Secondary | ICD-10-CM

## 2021-11-13 DIAGNOSIS — R197 Diarrhea, unspecified: Secondary | ICD-10-CM

## 2021-11-13 DIAGNOSIS — D61810 Antineoplastic chemotherapy induced pancytopenia: Secondary | ICD-10-CM | POA: Diagnosis not present

## 2021-11-13 DIAGNOSIS — E860 Dehydration: Secondary | ICD-10-CM

## 2021-11-13 DIAGNOSIS — E871 Hypo-osmolality and hyponatremia: Secondary | ICD-10-CM

## 2021-11-13 DIAGNOSIS — D6959 Other secondary thrombocytopenia: Secondary | ICD-10-CM

## 2021-11-13 LAB — COMPREHENSIVE METABOLIC PANEL
ALT: 122 U/L — ABNORMAL HIGH (ref 0–44)
AST: 88 U/L — ABNORMAL HIGH (ref 15–41)
Albumin: 3.3 g/dL — ABNORMAL LOW (ref 3.5–5.0)
Alkaline Phosphatase: 61 U/L (ref 38–126)
Anion gap: 5 (ref 5–15)
BUN: 16 mg/dL (ref 6–20)
CO2: 22 mmol/L (ref 22–32)
Calcium: 7.8 mg/dL — ABNORMAL LOW (ref 8.9–10.3)
Chloride: 107 mmol/L (ref 98–111)
Creatinine, Ser: 1.57 mg/dL — ABNORMAL HIGH (ref 0.61–1.24)
GFR, Estimated: 51 mL/min — ABNORMAL LOW (ref >60)
Glucose, Bld: 98 mg/dL (ref 70–99)
Potassium: 3.9 mmol/L (ref 3.5–5.1)
Sodium: 134 mmol/L — ABNORMAL LOW (ref 135–145)
Total Bilirubin: 1 mg/dL (ref 0.3–1.2)
Total Protein: 6.2 g/dL — ABNORMAL LOW (ref 6.5–8.1)

## 2021-11-13 LAB — CBC WITH DIFFERENTIAL/PLATELET
Abs Immature Granulocytes: 0.01 10*3/uL (ref 0.00–0.07)
Basophils Absolute: 0 10*3/uL (ref 0.0–0.1)
Basophils Relative: 1 %
Eosinophils Absolute: 0 10*3/uL (ref 0.0–0.5)
Eosinophils Relative: 0 %
HCT: 31.1 % — ABNORMAL LOW (ref 39.0–52.0)
Hemoglobin: 11 g/dL — ABNORMAL LOW (ref 13.0–17.0)
Immature Granulocytes: 1 %
Lymphocytes Relative: 21 %
Lymphs Abs: 0.2 10*3/uL — ABNORMAL LOW (ref 0.7–4.0)
MCH: 36.1 pg — ABNORMAL HIGH (ref 26.0–34.0)
MCHC: 35.4 g/dL (ref 30.0–36.0)
MCV: 102 fL — ABNORMAL HIGH (ref 80.0–100.0)
Monocytes Absolute: 0.2 10*3/uL (ref 0.1–1.0)
Monocytes Relative: 14 %
Neutro Abs: 0.7 10*3/uL — ABNORMAL LOW (ref 1.7–7.7)
Neutrophils Relative %: 63 %
Platelets: 21 10*3/uL — CL (ref 150–400)
RBC: 3.05 MIL/uL — ABNORMAL LOW (ref 4.22–5.81)
RDW: 12.9 % (ref 11.5–15.5)
WBC: 1.1 10*3/uL — CL (ref 4.0–10.5)
nRBC: 0 % (ref 0.0–0.2)

## 2021-11-13 LAB — CBC
HCT: 30.2 % — ABNORMAL LOW (ref 39.0–52.0)
Hemoglobin: 10.9 g/dL — ABNORMAL LOW (ref 13.0–17.0)
MCH: 36.7 pg — ABNORMAL HIGH (ref 26.0–34.0)
MCHC: 36.1 g/dL — ABNORMAL HIGH (ref 30.0–36.0)
MCV: 101.7 fL — ABNORMAL HIGH (ref 80.0–100.0)
Platelets: 23 10*3/uL — CL (ref 150–400)
RBC: 2.97 MIL/uL — ABNORMAL LOW (ref 4.22–5.81)
RDW: 12.9 % (ref 11.5–15.5)
WBC: 0.9 10*3/uL — CL (ref 4.0–10.5)
nRBC: 0 % (ref 0.0–0.2)

## 2021-11-13 LAB — TSH: TSH: 1.91 u[IU]/mL (ref 0.350–4.500)

## 2021-11-13 LAB — URINE CULTURE: Culture: NO GROWTH

## 2021-11-13 LAB — CREATININE, SERUM
Creatinine, Ser: 1.81 mg/dL — ABNORMAL HIGH (ref 0.61–1.24)
GFR, Estimated: 43 mL/min — ABNORMAL LOW (ref >60)

## 2021-11-13 LAB — GLUCOSE, CAPILLARY: Glucose-Capillary: 100 mg/dL — ABNORMAL HIGH (ref 70–99)

## 2021-11-13 LAB — ROCKY MTN SPOTTED FVR ABS PNL(IGG+IGM)
RMSF IgG: NEGATIVE
RMSF IgM: 0.2 index (ref 0.00–0.89)

## 2021-11-13 LAB — LACTIC ACID, PLASMA: Lactic Acid, Venous: 1 mmol/L (ref 0.5–1.9)

## 2021-11-13 LAB — HIV ANTIBODY (ROUTINE TESTING W REFLEX): HIV Screen 4th Generation wRfx: NONREACTIVE

## 2021-11-13 LAB — C DIFFICILE QUICK SCREEN W PCR REFLEX
C Diff antigen: NEGATIVE
C Diff interpretation: NOT DETECTED
C Diff toxin: NEGATIVE

## 2021-11-13 LAB — MAGNESIUM: Magnesium: 1.7 mg/dL (ref 1.7–2.4)

## 2021-11-13 LAB — PHOSPHORUS: Phosphorus: 1.9 mg/dL — ABNORMAL LOW (ref 2.5–4.6)

## 2021-11-13 MED ORDER — LORATADINE 10 MG PO TABS
10.0000 mg | ORAL_TABLET | Freq: Every day | ORAL | Status: DC
  Filled 2021-11-13: qty 1

## 2021-11-13 MED ORDER — SODIUM CHLORIDE 0.9 % IV SOLN: INTRAVENOUS | Status: AC

## 2021-11-13 MED ORDER — HYDRALAZINE HCL 20 MG/ML IJ SOLN: 10.0000 mg | Freq: Four times a day (QID) | INTRAMUSCULAR | Status: DC | PRN

## 2021-11-13 MED ORDER — ORAL CARE MOUTH RINSE: 15.0000 mL | OROMUCOSAL | Status: DC | PRN

## 2021-11-13 MED ORDER — SODIUM CHLORIDE 0.9 % IV SOLN
2.0000 g | Freq: Three times a day (TID) | INTRAVENOUS | Status: DC
  Filled 2021-11-13: qty 12.5

## 2021-11-13 MED ORDER — K PHOS MONO-SOD PHOS DI & MONO 155-852-130 MG PO TABS
500.0000 mg | ORAL_TABLET | Freq: Two times a day (BID) | ORAL | Status: AC
  Administered 2021-11-13 (×2): 500 mg via ORAL
  Filled 2021-11-13 (×2): qty 2

## 2021-11-13 MED ORDER — ONDANSETRON HCL 4 MG PO TABS: 4.0000 mg | ORAL_TABLET | Freq: Four times a day (QID) | ORAL | Status: DC | PRN

## 2021-11-13 MED ORDER — BOOST / RESOURCE BREEZE PO LIQD CUSTOM
1.0000 | Freq: Two times a day (BID) | ORAL | Status: DC
  Administered 2021-11-13 – 2021-11-15 (×3): 1 via ORAL

## 2021-11-13 MED ORDER — SODIUM CHLORIDE (PF) 0.9 % IJ SOLN
INTRAMUSCULAR | Status: AC
  Filled 2021-11-13: qty 50

## 2021-11-13 MED ORDER — ASPIRIN 325 MG PO TABS: 325.0000 mg | ORAL_TABLET | Freq: Every day | ORAL | Status: DC

## 2021-11-13 MED ORDER — ADULT MULTIVITAMIN W/MINERALS CH
1.0000 | ORAL_TABLET | Freq: Every day | ORAL | Status: DC
  Administered 2021-11-13 – 2021-11-16 (×4): 1 via ORAL
  Filled 2021-11-13 (×4): qty 1

## 2021-11-13 MED ORDER — OXYCODONE HCL 5 MG PO TABS: 5.0000 mg | ORAL_TABLET | ORAL | Status: DC | PRN

## 2021-11-13 MED ORDER — ONDANSETRON HCL 4 MG/2ML IJ SOLN: 4.0000 mg | Freq: Four times a day (QID) | INTRAMUSCULAR | Status: DC | PRN

## 2021-11-13 MED ORDER — CETIRIZINE HCL 10 MG PO TABS
10.0000 mg | ORAL_TABLET | Freq: Every day | ORAL | Status: DC
  Administered 2021-11-13 – 2021-11-16 (×4): 10 mg via ORAL
  Filled 2021-11-13 (×4): qty 1

## 2021-11-13 MED ORDER — MAGNESIUM SULFATE 2 GM/50ML IV SOLN
2.0000 g | Freq: Once | INTRAVENOUS | Status: AC
  Administered 2021-11-13: 2 g via INTRAVENOUS
  Filled 2021-11-13: qty 50

## 2021-11-13 MED ORDER — IOHEXOL 9 MG/ML PO SOLN
500.0000 mL | ORAL | Status: AC
  Administered 2021-11-13: 500 mL via ORAL

## 2021-11-13 MED ORDER — IOHEXOL 9 MG/ML PO SOLN
ORAL | Status: AC
  Administered 2021-11-13: 500 mL via ORAL
  Filled 2021-11-13: qty 1000

## 2021-11-13 MED ORDER — IOHEXOL 300 MG/ML  SOLN
100.0000 mL | Freq: Once | INTRAMUSCULAR | Status: AC | PRN
  Administered 2021-11-13: 75 mL via INTRAVENOUS

## 2021-11-13 MED ORDER — VALACYCLOVIR HCL 500 MG PO TABS
500.0000 mg | ORAL_TABLET | Freq: Two times a day (BID) | ORAL | Status: DC
  Administered 2021-11-13 – 2021-11-16 (×8): 500 mg via ORAL
  Filled 2021-11-13 (×8): qty 1

## 2021-11-13 MED ORDER — MORPHINE SULFATE (PF) 2 MG/ML IV SOLN: 2.0000 mg | INTRAVENOUS | Status: DC | PRN

## 2021-11-13 MED ORDER — SODIUM CHLORIDE 0.9 % IV SOLN: INTRAVENOUS | Status: DC

## 2021-11-13 MED ORDER — HEPARIN SODIUM (PORCINE) 5000 UNIT/ML IJ SOLN
5000.0000 [IU] | Freq: Three times a day (TID) | INTRAMUSCULAR | Status: DC
  Administered 2021-11-13 (×2): 5000 [IU] via SUBCUTANEOUS
  Filled 2021-11-13 (×2): qty 1

## 2021-11-13 NOTE — Progress Notes (Signed)
PHARMACY NOTE -  Cefepime  Pharmacy has been assisting with dosing of Cefepime for febrile neutropenia. Dosage remains stable at 2g IV q12 hr and further renal adjustments per institutional Pharmacy antibiotic protocol Borderline for renal adjustment so will follow daily  Pharmacy will sign off, following peripherally for culture results or dose adjustments. Please reconsult if a change in clinical status warrants re-evaluation of dosage.  Reuel Boom, PharmD, BCPS [phone] 11/13/2021, 9:51 AM

## 2021-11-13 NOTE — Progress Notes (Signed)
Initial Nutrition Assessment  INTERVENTION:   -Boost Breeze po BID, each supplement provides 250 kcal and 9 grams of protein  -Multivitamin with minerals daily  NUTRITION DIAGNOSIS:   Increased nutrient needs related to cancer and cancer related treatments as evidenced by estimated needs.  GOAL:   Patient will meet greater than or equal to 90% of their needs  MONITOR:   PO intake, Supplement acceptance, Labs, Weight trends, I & O's  REASON FOR ASSESSMENT:   Malnutrition Screening Tool    ASSESSMENT:   56 y.o. male with medical history significant for but not limited to abnormal LFTs, IgG kappa myeloma status post induction chemotherapy with RVD followed by an autologous stem cell transplant at [facility] was currently undergoing chemotherapy treatment as well as a history of anemia and other comorbidities who presented with a chief complaint of significant weakness, fatigue and fever.  Patient had recently gone to the beach and started feeling bad Saturday evening and then felt bad Sunday morning and Monday and then went to the local ED where they found him to be neutropenic.  Patient reports starting to feel sick with a fever on 7/22. Pt last had chemo on 7/20. Pt felt good enough to do a Crossfit workout on 7/21. Pt states he not been eating well over the past week. Typically eats 2.5 meals a day, good appetite. Doesn't use protein or vitamin supplements. He has generally tolerated his treatments well and his weight had remained stable ~214 lbs.   Pt states he ate part of a roll, some chicken noodle soup, a chocolate chip cookie and drank all of a grape juice this morning. RD provided pt with more juice and Boost Breeze to try.   Per weight records, pt has lost 12 lbs since 7/20 (5% wt loss x 1 week, significant for time frame).  Medications: K-Phos, IV Mg sulfate  Labs reviewed:  CBGs: 100 Low Na Low Phos  NUTRITION - FOCUSED PHYSICAL EXAM:  No depletions noted.  Diet  Order:   Diet Order             Diet regular Room service appropriate? Yes; Fluid consistency: Thin  Diet effective now                   EDUCATION NEEDS:   No education needs have been identified at this time  Skin:  Skin Assessment: Reviewed RN Assessment  Last BM:  7/28  Height:   Ht Readings from Last 1 Encounters:  11/12/21 '6\' 1"'$  (1.854 m)    Weight:   Wt Readings from Last 1 Encounters:  11/13/21 91.6 kg    BMI:  Body mass index is 26.64 kg/m.  Estimated Nutritional Needs:   Kcal:  2400-2600  Protein:  120-130g  Fluid:  2.4L/day  Clayton Bibles, MS, RD, LDN Inpatient Clinical Dietitian Contact information available via Amion

## 2021-11-13 NOTE — Consult Note (Signed)
[name] is well-known to me.  He is a 56 year old white male.  He has a history of IgG kappa multiple myeloma.  He has had treatment.  He has had a stem cell transplant.  This was back in 2017.  He has been on maintenance therapy with Velcade and Revlimid.  He is done very well with this.  So far, there is been no evidence of his myeloma relapsing.  We have been watching his light chains.  He was on vacation at [employer].  He began to feel fatigued and weak.  He just had a lot of exhaustion.  He had no pain.  There is no bleeding.  He subsequently went to a local emergency room.  He had mild leukopenia.  He was put on some antibiotic and given some Neupogen.  He felt a little bit better.  However, couple days ago, he then began to feel worse.  He had a low-grade temperature.  He subsequently came back to [place].  He was admitted.  His white cell count was 0.9.  His hemoglobin 12.8.  Platelet count 27,000.  A week ago, his white cell count was 2.8.  Hemoglobin 12.7.  Platelet count 140,000.  Of note, back in June, he and his family were up in [place] for a month.  He had a sinus infection and was treated at a emergency room in [place].  He denies any rash.  He denies any kind of insect bite.  He has had no arthralgias or myalgias.  There is been no change in bowel or bladder habits.  He has had a little bit of diarrhea.  Of note, his chemistries on 11/12/2021 did show an elevated LFTs with SGPT of 145 SGOT 100.  These have been on the higher side.  Cultures have been taken.  He has had no swollen lymph nodes.  There is been no mouth sores.  Overall, I was his performance status is probably ECOG 0.   On his physical exam, his vital signs show temperature of 99.8.  Pulse 98.  Blood pressure 134/76.  His head neck exam shows no ocular or oral lesions.  There is no scleral icterus.  He has no adenopathy in the neck.  Lungs are clear.  Cardiac exam regular rate and rhythm.  He has no murmurs,  rubs or bruits.  Abdomen is soft.  Bowel sounds are present.  There is no fluid wave.  There is no palpable liver or spleen tip.  Extremity shows no clubbing, cyanosis or edema.  Skin exam shows no rashes, ecchymosis or petechia.  Neurological exam shows no focal neurological deficits.  [name] has a history of myeloma.  I do worry about the possibility of him developing myelodysplasia or possibly even acute leukemia.  I looked at his peripheral blood smear.  Thankfully, I do not see anything that was obvious on the blood smear that would explain the significant leukopenia and probably cytopenia.  He definitely needs to have a bone marrow test done.  I think this will tell us what is going on.  It is possible that he just may have cumulative effects from the Revlimid.  I am going to stop his aspirin right now.  I would hold his heparin given his thrombocytopenia.  I would also probably get a CT scan given his elevated LFTs.  Some of this might be from treatment.  Also might be from potential alcohol use.  We will see what the cultures show.  I  would suspect that they will be unremarkable.  I am going to hold on the Neupogen for right now.  If there is the potential for underlying leukemia, I do not want Neupogen to stimulate this.  I appreciate the fact that he is getting such great care while the staff on 6 E.  [facility], MD  Proverbs 3:5-6

## 2021-11-13 NOTE — Progress Notes (Signed)
PROGRESS NOTE    Tyler Phillips.  LYY:[id number] DOB: February 16, 1966 DOA: 11/12/2021 PCP: Tyler Melter, MD   Brief Narrative:  Tyler Phillips. is a 56 y.o. male with medical history significant for but not limited to abnormal LFTs, IgG kappa myeloma status post induction chemotherapy with RVD followed by an autologous stem cell transplant at [facility] was currently undergoing chemotherapy treatment as well as a history of anemia and other comorbidities who presented with a chief complaint of significant weakness, fatigue and fever.  Patient had recently gone to the beach and started feeling bad Saturday evening and then felt bad Sunday morning and Monday and then went to the local ED where they found him to be neutropenic.  They offered him admission but he declined and they sent him home with antibiotics with Levaquin and they give him a shot of Neupogen.  Patient started feeling better yesterday but then this morning when he woke up he felt terrible and drove directly from the beach to [facility] ED.  Patient sees Tyler Phillips as his hematologist oncologist and hematology oncology were contacted and Tyler Phillips recommended placing the patient on IV cefepime as well as giving him a dose of Granix tonight.  Patient continues to feel better but denies any nausea or real vomiting.  States that he has some slight diarrhea but thinks it is from not from eating properly.  His appetite been extremely poor he is not eating very much the last few days.  He denies any burning or discomfort in his urine, chest pain or shortness of breath.  Patient states that he just does not feel good and feels extremely weak.  Upon arrival to the ED patient was afebrile but patient states that he has been subjectively febrile and not been able to take his temperature given that the rental vacation property he was staying and did not have a thermometer.  Patient has been taking acetaminophen though as an outpatient.  Further work-up in the ED  showed that labs that he was pancytopenic and that his neutrophil count was 500.  Patient was also noted to have abnormal LFTs, and a elevated creatinine up from a week ago where it was 1.43.  Patient is being admitted for neutropenic fever and pancytopenia with acute on chronic kidney disease and abnormal LFTs.  Oncology has been consulted and working the patient up further and recommending a CT bone marrow biopsy as well as aspiration as well as obtaining a CT of the chest abdomen and pelvis for further evaluation and recommending stopping the Granix and continue IV cefepime.  Is having quite a bit of diarrhea that is significant will check him for C. difficile as well as a GI pathogen panel given unclear source of infection  Assessment and Plan:  Neutropenic fever Pancytopenia -Patient presents with subjective fevers and is significantly pancytopenic now after his chemotherapy; patient was actually febrile overnight and had Tmax of 102.3 and his ANC is now 700 -Patient has a history of IgG kappa myeloma and is currently on chemotherapy and received it last week with Velcade and Revlimid; he is status post induction chemotherapy with RVD followed by autologous stem cell transplant at [facility] in February 2017 -Presents with subjective fevers, and WBC count of 0.9 and a neutrophil count of 500 at the time of admission; now WBC is 1.1 and ANC is 700 -We will give him IV fluid hydration and he received 1 L bolus in the ED which we will repeat and will  place on maintenance IV fluids with normal saline at 100 MLS per hour and will continue for 1 day -Continue with antipyretics -Blood cultures x2 have been obtained and pending -Patient continues to have a pancytopenia and WBC went from 0.9 is now 1.1 and hemoglobin/hematocrit has gone from 12.8/35.6 and is now 11.0/31.1 and platelet count has dropped from 27 -> 21 -Urinalysis not really remarkable for infection and chest x-ray showed no acute cardiopulmonary  disease -Unclear etiology of where his fever and source is but he did state that he had a little bit of diarrhea.  Patient admits to having very poor p.o. intake last few days.  Diarrhea is worsening so we will check this for infectious diarrhea and obtain a GI pathogen panel and C. Difficile; patient states that he has been on antibiotics in the last few months and received a course of antibiotics for sinusitis in [place] late June and received some Levaquin recently when he was in the ED in [place] oncology has stopped his aspirin given his thrombocytopenia and also his subcu heparin -Empirically started IV cefepime which we will continue and patient is to get Granix tonight -Lactic acid level went from 1.6 and 1.0 -Patient received Granix yesterday but now hematology oncology recommending holding Neupogen given that there is a potential for underlying leukemia and they do not want the Neupogen to stimulate this -Medical oncology recommending getting CT of the chest abdomen pelvis as well as a CT bone marrow biopsy and aspiration; CT bone marrow biopsy and aspiration is scheduled for Monday, 11/16/2021   IgG kappa myeloma -Hold his chemotherapy and hematology oncology has been notified -Patient sees Tyler Phillips in the outpatient setting and recently had chemotherapy last week -Appreciate Tyler Phillips evaluating today and plan is as above getting a CT of the chest abdomen pelvis as well as a CT bone marrow biopsy and aspiration   AKI on CKD stage IIIa -Patient's BUNs/creatinine went from 11/1.43 -> 15/1.88 and is now finally improving and is 16/1.57 -Given IV fluid hydration with 2 L boluses and started on maintenance IV fluid hydration with normal saline at 100 MLS per hour -We will need to avoid nephrotoxic medications, contrast dyes, hypotension and dehydration to ensure adequate renal perfusion and will need to renally adjust medications -Repeat CMP in the  a.m.   Abnormal LFTs -AST and ALT have worsened but he does have a history of abnormal LFTs -AST went from 66 on last check is now 100 the time of admission but now improving to 88 -ALT went from 58 and is now 145 at the time of admission and has now trended down to 122  Hypophosphatemia -Patient's Phos level was 1.9 -Replete with p.o. K-Phos Neutral 500 mg p.o. twice daily x2 doses -Continue monitor and replete as necessary -Repeat Phos level in a.m.  Hypoalbuminemia -Patient's albumin level went from 3.8 is now 3.3 -Continue to monitor and trend and repeat CMP in a.m.   Hyperglycemia -Patient's blood sugar on admission was 108 and repeat this morning was 98 -Check hemoglobin A1c -Continue to monitor blood sugars carefully and if necessary will need to place on sensitive NovoLog/scale insulin   Hyponatremia -Likely in the setting of his dehydration -Patient's sodium is now gone from 133 and is now 134 -Started IV fluid hydration as above We will continue to monitor and trend and repeat CMP in a.m.    DVT prophylaxis: Place and maintain sequential compression device Start: 11/13/21 0652 SCDs  Start: 11/13/21 0007    Code Status: Full Code Family Communication: No family currently at bedside  Disposition Plan:  Level of care: Telemetry Status is: Inpatient Remains inpatient appropriate because: Continues to spike temperatures and will need to be afebrile for at least 24 hours prior to safe discharge disposition and need further work-up given oncology has ordered a CT chest/abdomen/pelvis and a CT-guided bone marrow biopsy and aspiration   Consultants:  Medical oncology  Procedures:  See above  Antimicrobials:  Anti-infectives (From admission, onward)    Start     Dose/Rate Route Frequency Ordered Stop   11/13/21 0400  ceFEPIme (MAXIPIME) 2 g in sodium chloride 0.9 % 100 mL IVPB        2 g 200 mL/hr over 30 Minutes Intravenous Every 12 hours 11/12/21 1809      11/13/21 0015  valACYclovir (VALTREX) tablet 500 mg        500 mg Oral 2 times daily 11/13/21 0006     11/13/21 0015  ceFEPIme (MAXIPIME) 2 g in sodium chloride 0.9 % 100 mL IVPB  Status:  Discontinued        2 g 200 mL/hr over 30 Minutes Intravenous Every 8 hours 11/13/21 0006 11/13/21 0009   11/12/21 1615  ceFEPIme (MAXIPIME) 2 g in sodium chloride 0.9 % 100 mL IVPB        2 g 200 mL/hr over 30 Minutes Intravenous  Once 11/12/21 1605 11/12/21 1707       Subjective: Seen and examined at bedside and had a temperature overnight and continues to have significant diarrhea.  States that he has been up and down having quite a bit of loose watery bowel movements.  No nausea or vomiting and states his fevers improved and he feels a little bit better this morning.  Denies any chest pain or shortness breath.  No cough or burning discomfort in his urine.  No other concerns or complaints at this time.  Objective: Vitals:   11/13/21 0330 11/13/21 0404 11/13/21 0636 11/13/21 0803  BP: 114/83 134/76  121/79  Pulse: 72 98  78  Resp: _0 Temp:  99.8 F (37.7 C)  98.5 F (36.9 C)  TempSrc:  Oral  Oral  SpO2: 100% 100%  98%  Weight:   91.6 kg   Height:        Intake/Output Summary (Last 24 hours) at 11/13/2021 0956 Last data filed at 11/13/2021 0900 Gross per 24 hour  Intake 393.69 ml  Output --  Net 393.69 ml   Filed Weights   11/12/21 1255 11/13/21 0636  Weight: 97.1 kg 91.6 kg   Examination: Physical Exam:  Constitutional: WN/WD overweight Caucasian male currently no acute distress Respiratory: Diminished to auscultation bilaterally, no wheezing, rales, rhonchi or crackles. Normal respiratory effort and patient is not tachypenic. No accessory muscle use.  Unlabored breathing Cardiovascular: RRR, no murmurs / rubs / gallops. S1 and S2 auscultated. No extremity edema.  Abdomen: Soft, very slightly-tender, distended secondary body habitus. Bowel sounds positive.  GU:  Deferred. Musculoskeletal: No clubbing / cyanosis of digits/nails. No joint deformity upper and lower extremities.  Skin: No rashes, lesions, ulcers on limited skin evaluation. No induration; Warm and dry.  Neurologic: CN 2-12 grossly intact with no focal deficits. Romberg sign and cerebellar reflexes not assessed.  Psychiatric: Normal judgment and insight. Alert and oriented x 3. Normal mood and appropriate affect.   Data Reviewed: I have personally reviewed following labs and imaging studies  CBC: Recent Labs  Lab 11/12/21 1321 11/13/21 0007 11/13/21 0550  WBC 0.9* 0.9* 1.1*  NEUTROABS 0.5*  --  0.7*  HGB 12.8* 10.9* 11.0*  HCT 35.6* 30.2* 31.1*  MCV 100.6* 101.7* 102.0*  PLT 27* 23* 21*   Basic Metabolic Panel: Recent Labs  Lab 11/12/21 1321 11/13/21 0007 11/13/21 0550  NA 133*  --  134*  K 3.8  --  3.9  CL 101  --  107  CO2 23  --  22  GLUCOSE 108*  --  98  BUN 15  --  16  CREATININE 1.88* 1.81* 1.57*  CALCIUM 8.3*  --  7.8*  MG  --   --  1.7  PHOS  --   --  1.9*   GFR: Estimated Creatinine Clearance: 58.7 mL/min (A) (by C-G formula based on SCr of 1.57 mg/dL (H)). Liver Function Tests: Recent Labs  Lab 11/12/21 1321 11/13/21 0550  AST 100* 88*  ALT 145* 122*  ALKPHOS 80 61  BILITOT 1.2 1.0  PROT 7.5 6.2*  ALBUMIN 3.8 3.3*   No results for input(s): "LIPASE", "AMYLASE" in the last 168 hours. No results for input(s): "AMMONIA" in the last 168 hours. Coagulation Profile: Recent Labs  Lab 11/12/21 1321  INR 1.1   Cardiac Enzymes: No results for input(s): "CKTOTAL", "CKMB", "CKMBINDEX", "TROPONINI" in the last 168 hours. BNP (last 3 results) No results for input(s): "PROBNP" in the last 8760 hours. HbA1C: No results for input(s): "HGBA1C" in the last 72 hours. CBG: Recent Labs  Lab 11/12/21 1256 11/13/21 0723  GLUCAP 94 100*   Lipid Profile: No results for input(s): "CHOL", "HDL", "LDLCALC", "TRIG", "CHOLHDL", "LDLDIRECT" in the last 72  hours. Thyroid Function Tests: Recent Labs    11/13/21 0550  TSH 1.910   Anemia Panel: No results for input(s): "VITAMINB12", "FOLATE", "FERRITIN", "TIBC", "IRON", "RETICCTPCT" in the last 72 hours. Sepsis Labs: Recent Labs  Lab 11/12/21 1321 11/13/21 0550  LATICACIDVEN 1.6 1.0    Recent Results (from the past 240 hour(s))  SARS Coronavirus 2 by RT PCR (hospital order, performed in [facility] hospital lab) *cepheid single result test* Anterior Nasal Swab     Status: None   Collection Time: 11/12/21  1:22 PM   Specimen: Anterior Nasal Swab  Result Value Ref Range Status   SARS Coronavirus 2 by RT PCR NEGATIVE NEGATIVE Final    Comment: (NOTE) SARS-CoV-2 target nucleic acids are NOT DETECTED.  The SARS-CoV-2 RNA is generally detectable in upper and lower respiratory specimens during the acute phase of infection. The lowest concentration of SARS-CoV-2 viral copies this assay can detect is 250 copies / mL. A negative result does not preclude SARS-CoV-2 infection and should not be used as the sole basis for treatment or other patient management decisions.  A negative result may occur with improper specimen collection / handling, submission of specimen other than nasopharyngeal swab, presence of viral mutation(s) within the areas targeted by this assay, and inadequate number of viral copies (<250 copies / mL). A negative result must be combined with clinical observations, patient history, and epidemiological information.  Fact Sheet for Patients:   https://www.patel.info/  Fact Sheet for Healthcare Providers: https://hall.com/  This test is not yet approved or  cleared by the [place] FDA and has been authorized for detection and/or diagnosis of SARS-CoV-2 by FDA under an Emergency Use Authorization (EUA).  This EUA will remain in effect (meaning this test can be used) for the duration of the COVID-19  declaration under Section  564(b)(1) of the Act, 21 U.S.C. section 360bbb-3(b)(1), unless the authorization is terminated or revoked sooner.  Performed at [facility], [place] [address] [place], [place] [address]   Blood Culture (routine x 2)     Status: None (Preliminary result)   Collection Time: 11/12/21  1:26 PM   Specimen: BLOOD  Result Value Ref Range Status   Specimen Description   Final    BLOOD RIGHT ANTECUBITAL Performed at [facility] [address] [place], [place] [address]    Special Requests   Final    BOTTLES DRAWN AEROBIC AND ANAEROBIC Blood Culture adequate volume Performed at [facility] [address], [place], [place] [address]    Culture   Final    NO GROWTH < 24 HOURS Performed at [facility] [address], [place], [place] [address]    Report Status PENDING  Incomplete  Blood Culture (routine x 2)     Status: None (Preliminary result)   Collection Time: 11/12/21  1:36 PM   Specimen: BLOOD  Result Value Ref Range Status   Specimen Description   Final    BLOOD LEFT ANTECUBITAL Performed at [facility] [address], [place], [place] [address]    Special Requests   Final    BOTTLES DRAWN AEROBIC AND ANAEROBIC Blood Culture adequate volume Performed at [facility] [address], [place], [place] [address]    Culture   Final    NO GROWTH < 24 HOURS Performed at [facility] [address], [place], [place] [address]    Report Status PENDING  Incomplete    Radiology Studies: DG Chest 2 View  Result Date: 11/12/2021 CLINICAL DATA:  Questionable sepsis - evaluate for abnormality EXAM: CHEST - 2 VIEW COMPARISON:  04/10/2020 FINDINGS: The heart size and mediastinal contours are within normal limits. No focal airspace consolidation, pleural effusion, or pneumothorax. The visualized skeletal structures are unremarkable. IMPRESSION: No active cardiopulmonary disease.  Electronically Signed   By: Davina Poke D.O.   On: 11/12/2021 14:12     Scheduled Meds:  loratadine  10 mg Oral Daily   valACYclovir  500 mg Oral BID   Continuous Infusions:  sodium chloride Stopped (11/13/21 0340)   ceFEPime (MAXIPIME) IV Stopped (11/13/21 0437)    LOS: 1 day   [name], DO [facility] Available via Epic secure chat 7am-7pm After these hours, please refer to coverage provider listed on [email or web address] 11/13/2021, 9:56 AM

## 2021-11-13 NOTE — Consult Note (Signed)
Chief Complaint: Patient was seen in consultation today for CT-guided bone marrow biopsy Chief Complaint  Patient presents with   Abnormal Lab    Referring Physician(s): Ennever,P  Supervising Physician: Corrie Mckusick  Patient Status: [facility] - In-pt  History of Present Illness: Tyler Demetro. is a 56 y.o. male with past medical history of IgG kappa multiple myeloma with prior stem cell transplant in 2017 currently on maintenance therapy with Velcade and Revlimid.  He presents now with fatigue, weakness, low-grade temperature, persistent and slightly worsening leukopenia/ thrombocytopenia/anemia, diarrhea, persistent renal insufficiency/elevated LFTs.  Request now received for CT-guided bone marrow biopsy to evaluate for developing myelodysplasia or possibly even acute leukemia.  Past Medical History:  Diagnosis Date   Anemia    Cancer ([place])    Elevated LFTs    Gallbladder polyp    Multiple myeloma ([place])    Pneumonia     Past Surgical History:  Procedure Laterality Date   auto stem cell transplant      Allergies: Patient has no known allergies.  Medications: Prior to Admission medications   Medication Sig Start Date End Date Taking? Authorizing Provider  aspirin 325 MG tablet Take 325 mg by mouth daily.   Yes [provider]  azithromycin (ZITHROMAX) 500 MG tablet Take 500 mg by mouth daily as needed (For pre dental appointments).   Yes [provider]  bortezomib IV (VELCADE) 3.5 MG injection Inject 1 mg/m2 into the vein every 14 (fourteen) days.   Yes [provider]  cetirizine (ZYRTEC) 10 MG tablet Take 10 mg by mouth daily.   Yes [provider]  lenalidomide (REVLIMID) 10 MG capsule Take one tablet every day for three weeks on and then one week off-[employer] Auth #[id number]     Date Obtained 10/09/21 Patient taking differently: Take 10 mg by mouth every evening. 10/12/21  Yes Ennever, Rudell Cobb, MD  levofloxacin (LEVAQUIN) 750 MG  tablet Take 750 mg by mouth daily. 11/10/21  Yes [provider]  valACYclovir (VALTREX) 500 MG tablet Take 500 mg by mouth 2 (two) times daily.    Yes [provider]  LORazepam (ATIVAN) 1 MG tablet Take 1 tablet (1 mg total) by mouth every 4 (four) hours as needed. Patient not taking: Reported on 11/12/2021 01/15/19   [name], MD  NINLARO 4 MG capsule TAKE 1 CAPSULE BY MOUTH ONCE ON  DAYS 1, 8, AND 15 OF A 28 DAY  CYCLE ON AN EMPTY STOMACH 1 HOUR BEFORE OR 2 HOURS AFTER A MEAL Patient not taking: Reported on 11/12/2021 10/03/21   [name], MD  zolpidem (AMBIEN) 10 MG tablet Take 1 tablet (10 mg total) by mouth at bedtime as needed for sleep. 01/15/19 08/28/21  [name], MD     Family History  Problem Relation Age of Onset   Prostate cancer Mother    COPD Father    Heart disease Brother    Leukemia Maternal Grandmother    Stomach cancer Neg Hx    Colon cancer Neg Hx    Esophageal cancer Neg Hx     Social History   Socioeconomic History   Marital status: Married    Spouse name: Not on file   Number of children: 3   Years of education: Not on file   Highest education level: Not on file  Occupational History   Occupation: retired Chief Executive Officer  Tobacco Use   Smoking status: Never   Smokeless tobacco:  Never   Tobacco comments:    NEVER USED TOBACCO  Vaping Use   Vaping Use: Never used  Substance and Sexual Activity   Alcohol use: Yes    Alcohol/week: 7.0 standard drinks of alcohol    Types: 7 Glasses of wine per week    Comment: beer wine and liqour 2 drinks a night   Drug use: No   Sexual activity: Not on file  Other Topics Concern   Not on file  Social History Narrative   Not on file   Social Determinants of Health   Financial Resource Strain: Not on file  Food Insecurity: Not on file  Transportation Needs: Not on file  Physical Activity: Not on file  Stress: Not on file  Social Connections: Not on file      Review of Systems  see above, currently denies fever, headache, chest pain, dyspnea, cough, abdominal/back pain, nausea, vomiting or bleeding  Vital Signs: BP 124/71 (BP Location: Right Arm)   Pulse 77   Temp 98.5 F (36.9 C) (Oral)   Resp 18   Ht '6\' 1"'  (1.854 m)   Wt 201 lb 15.1 oz (91.6 kg)   SpO2 99%   BMI 26.64 kg/m     Physical Exam awake, alert.  Chest clear to auscultation bilaterally.  Heart with regular rate and rhythm.  Abdomen soft, positive bowel sounds, nontender.  No lower extremity edema  Imaging: CT CHEST ABDOMEN PELVIS W CONTRAST  Result Date: 11/13/2021 CLINICAL DATA:  Hematologic malignancy. Pancytopenia. Myeloma. * Tracking Code: BO * EXAM: CT CHEST, ABDOMEN, AND PELVIS WITH CONTRAST TECHNIQUE: Multidetector CT imaging of the chest, abdomen and pelvis was performed following the standard protocol during bolus administration of intravenous contrast. RADIATION DOSE REDUCTION: This exam was performed according to the departmental dose-optimization program which includes automated exposure control, adjustment of the mA and/or kV according to patient size and/or use of iterative reconstruction technique. CONTRAST:  38m OMNIPAQUE IOHEXOL 300 MG/ML  SOLN COMPARISON:  FDG PET scan 07/10/2014 FINDINGS: CT CHEST FINDINGS Cardiovascular: No significant vascular findings. Normal heart size. No pericardial effusion. Mediastinum/Nodes: No axillary or supraclavicular adenopathy. No mediastinal or hilar adenopathy. No pericardial fluid. Esophagus normal. Lungs/Pleura: No suspicious pulmonary nodules. Normal pleural. Airways normal. Musculoskeletal: Lucent lesion centrally within the manubrium measuring 12 mm not changed from comparison PET-CT scan 2016. A similar lucent 9 mm lesion in the sternum on image 67 unchanged. CT ABDOMEN AND PELVIS FINDINGS Hepatobiliary: No focal hepatic lesion. No biliary ductal dilatation. Gallbladder is normal. Common bile duct is normal. Pancreas: Pancreas is normal. No ductal  dilatation. No pancreatic inflammation. Spleen: Spleen is mildly enlarged measuring 13.9 x 6.2 x 12.6 cm (volume = 570 cm^3). Calculated volume in 2016 equal 687.) Adrenals/urinary tract: Adrenal glands and kidneys are normal. The ureters and bladder normal. Stomach/Bowel: Stomach, small bowel, appendix, and cecum are normal. The colon and rectosigmoid colon are normal. Vascular/Lymphatic: Abdominal aorta is normal caliber. There is no retroperitoneal or periportal lymphadenopathy. No pelvic lymphadenopathy. Reproductive: Prostate unremarkable Other: No free fluid. Musculoskeletal: No aggressive osseous lesion. IMPRESSION: 1. No evidence of active myeloma on CT chest abdomen pelvis. 2. Lucent lesions in the sternum unchanged from comparison PET-CT scan 2016. 3. No plasmacytoma. 4. Stable mild splenomegaly. Electronically Signed   By: SSuzy BouchardM.D.   On: 11/13/2021 11:25   DG Chest 2 View  Result Date: 11/12/2021 CLINICAL DATA:  Questionable sepsis - evaluate for abnormality EXAM: CHEST - 2 VIEW COMPARISON:  04/10/2020 FINDINGS:  The heart size and mediastinal contours are within normal limits. No focal airspace consolidation, pleural effusion, or pneumothorax. The visualized skeletal structures are unremarkable. IMPRESSION: No active cardiopulmonary disease. Electronically Signed   By: Davina Poke D.O.   On: 11/12/2021 14:12    Labs:  CBC: Recent Labs    11/05/21 1143 11/12/21 1321 11/13/21 0007 11/13/21 0550  WBC 2.8* 0.9* 0.9* 1.1*  HGB 12.7* 12.8* 10.9* 11.0*  HCT 36.0* 35.6* 30.2* 31.1*  PLT 140* 27* 23* 21*    COAGS: Recent Labs    11/12/21 1321  INR 1.1  APTT 28    BMP: Recent Labs    09/18/21 0929 11/05/21 1143 11/12/21 1321 11/13/21 0007 11/13/21 0550  NA 140 139 133*  --  134*  K 3.8 3.8 3.8  --  3.9  CL 109 105 101  --  107  CO2 '24 25 23  ' --  22  GLUCOSE 96 132* 108*  --  98  BUN '12 11 15  ' --  16  CALCIUM 8.8* 8.9 8.3*  --  7.8*  CREATININE 1.32*  1.43* 1.88* 1.81* 1.57*  GFRNONAA >60 58* 41* 43* 51*    LIVER FUNCTION TESTS: Recent Labs    09/18/21 0929 11/05/21 1143 11/12/21 1321 11/13/21 0550  BILITOT 0.5 0.7 1.2 1.0  AST 43* 66* 100* 88*  ALT 112* 58* 145* 122*  ALKPHOS 53 56 80 61  PROT 6.9 7.0 7.5 6.2*  ALBUMIN 4.2 4.4 3.8 3.3*    TUMOR MARKERS: No results for input(s): "AFPTM", "CEA", "CA199", "CHROMGRNA" in the last 8760 hours.  Assessment and Plan: 56 y.o. male with past medical history of IgG kappa multiple myeloma with prior stem cell transplant in 2017 currently on maintenance therapy with Velcade and Revlimid.  He presents now with fatigue, weakness, low-grade temperature, persistent and slightly worsening leukopenia/ thrombocytopenia/anemia, diarrhea ( c diff neg), persistent renal insufficiency/elevated LFTs.  Request now received for CT-guided bone marrow biopsy to evaluate for developing myelodysplasia or possibly even acute leukemia.Risks and benefits of procedure was discussed with the patient and/or patient's family including, but not limited to bleeding, infection, damage to adjacent structures or low yield requiring additional tests.  All of the questions were answered and there is agreement to proceed.  Consent signed and in chart. Procedure scheduled for next week, unsure at this point of exact date.    Thank you for this interesting consult.  I greatly enjoyed meeting Tyler Phillips. and look forward to participating in their care.  A copy of this report was sent to the requesting provider on this date.  Electronically Signed: D. Rowe Robert, PA-C 11/13/2021, 2:45 PM   I spent a total of   20 minutes  in face to face in clinical consultation, greater than 50% of which was counseling/coordinating care for CT-guided bone marrow biopsy

## 2021-11-13 NOTE — TOC Progression Note (Addendum)
Transition of Care (TOC) - Progression Note    Patient Details  Name: Tyler Phillips. MRN: [id number] Date of Birth: 08-10-1965  Transition of Care [facility]) CM/SW Contact  Servando Snare, [place] Phone Number: 11/13/2021, 12:44 PM  Clinical Narrative:      Transition of Care [facility]) Screening Note   Patient Details  Name: Tyler Phillips. Date of Birth: Aug 22, 1965   Transition of Care [facility]) CM/SW Contact:    Servando Snare, LCSW Phone Number: 11/13/2021, 12:44 PM    Transition of Care Department [facility]) has reviewed patient and no TOC needs have been identified at this time. We will continue to monitor patient advancement through interdisciplinary progression rounds. If new patient transition needs arise, please place a TOC consult.         Expected Discharge Plan and Services                                                 Social Determinants of Health (SDOH) Interventions    Readmission Risk Interventions     No data to display

## 2021-11-14 DIAGNOSIS — D61810 Antineoplastic chemotherapy induced pancytopenia: Secondary | ICD-10-CM | POA: Diagnosis not present

## 2021-11-14 DIAGNOSIS — N179 Acute kidney failure, unspecified: Secondary | ICD-10-CM | POA: Diagnosis not present

## 2021-11-14 DIAGNOSIS — Z9484 Stem cells transplant status: Secondary | ICD-10-CM

## 2021-11-14 DIAGNOSIS — R5383 Other fatigue: Secondary | ICD-10-CM | POA: Diagnosis not present

## 2021-11-14 DIAGNOSIS — R5081 Fever presenting with conditions classified elsewhere: Secondary | ICD-10-CM | POA: Diagnosis not present

## 2021-11-14 DIAGNOSIS — C9000 Multiple myeloma not having achieved remission: Secondary | ICD-10-CM | POA: Diagnosis not present

## 2021-11-14 DIAGNOSIS — Z9481 Bone marrow transplant status: Secondary | ICD-10-CM | POA: Diagnosis not present

## 2021-11-14 DIAGNOSIS — D709 Neutropenia, unspecified: Secondary | ICD-10-CM | POA: Diagnosis not present

## 2021-11-14 LAB — COMPREHENSIVE METABOLIC PANEL
ALT: 119 U/L — ABNORMAL HIGH (ref 0–44)
AST: 75 U/L — ABNORMAL HIGH (ref 15–41)
Albumin: 3 g/dL — ABNORMAL LOW (ref 3.5–5.0)
Alkaline Phosphatase: 55 U/L (ref 38–126)
Anion gap: 7 (ref 5–15)
BUN: 8 mg/dL (ref 6–20)
CO2: 19 mmol/L — ABNORMAL LOW (ref 22–32)
Calcium: 7.6 mg/dL — ABNORMAL LOW (ref 8.9–10.3)
Chloride: 112 mmol/L — ABNORMAL HIGH (ref 98–111)
Creatinine, Ser: 1.36 mg/dL — ABNORMAL HIGH (ref 0.61–1.24)
GFR, Estimated: >60 mL/min (ref >60)
Glucose, Bld: 100 mg/dL — ABNORMAL HIGH (ref 70–99)
Potassium: 3.7 mmol/L (ref 3.5–5.1)
Sodium: 138 mmol/L (ref 135–145)
Total Bilirubin: 0.7 mg/dL (ref 0.3–1.2)
Total Protein: 5.2 g/dL — ABNORMAL LOW (ref 6.5–8.1)

## 2021-11-14 LAB — CBC WITH DIFFERENTIAL/PLATELET
Abs Immature Granulocytes: 0.03 10*3/uL (ref 0.00–0.07)
Basophils Absolute: 0 10*3/uL (ref 0.0–0.1)
Basophils Relative: 1 %
Eosinophils Absolute: 0 10*3/uL (ref 0.0–0.5)
Eosinophils Relative: 1 %
HCT: 29.3 % — ABNORMAL LOW (ref 39.0–52.0)
Hemoglobin: 10.4 g/dL — ABNORMAL LOW (ref 13.0–17.0)
Immature Granulocytes: 2 %
Lymphocytes Relative: 53 %
Lymphs Abs: 0.9 10*3/uL (ref 0.7–4.0)
MCH: 35.6 pg — ABNORMAL HIGH (ref 26.0–34.0)
MCHC: 35.5 g/dL (ref 30.0–36.0)
MCV: 100.3 fL — ABNORMAL HIGH (ref 80.0–100.0)
Monocytes Absolute: 0.2 10*3/uL (ref 0.1–1.0)
Monocytes Relative: 13 %
Neutro Abs: 0.5 10*3/uL — ABNORMAL LOW (ref 1.7–7.7)
Neutrophils Relative %: 30 %
Platelets: 27 10*3/uL — CL (ref 150–400)
RBC: 2.92 MIL/uL — ABNORMAL LOW (ref 4.22–5.81)
RDW: 13.1 % (ref 11.5–15.5)
WBC: 1.8 10*3/uL — ABNORMAL LOW (ref 4.0–10.5)
nRBC: 0 % (ref 0.0–0.2)

## 2021-11-14 LAB — LACTATE DEHYDROGENASE: LDH: 308 U/L — ABNORMAL HIGH (ref 98–192)

## 2021-11-14 LAB — GASTROINTESTINAL PANEL BY PCR, STOOL (REPLACES STOOL CULTURE)

## 2021-11-14 LAB — GLUCOSE, CAPILLARY: Glucose-Capillary: 99 mg/dL (ref 70–99)

## 2021-11-14 LAB — MAGNESIUM: Magnesium: 2.2 mg/dL (ref 1.7–2.4)

## 2021-11-14 LAB — PHOSPHORUS: Phosphorus: 2.4 mg/dL — ABNORMAL LOW (ref 2.5–4.6)

## 2021-11-14 MED ORDER — K PHOS MONO-SOD PHOS DI & MONO 155-852-130 MG PO TABS
500.0000 mg | ORAL_TABLET | Freq: Once | ORAL | Status: AC
  Administered 2021-11-14: 500 mg via ORAL
  Filled 2021-11-14: qty 2

## 2021-11-14 NOTE — Progress Notes (Addendum)
PROGRESS NOTE    Tyler Phillips.  UUV:[id number] DOB: 1965/05/18 DOA: 11/12/2021 PCP: [name], MD   Brief Narrative:  Tyler Lough. is a 56 y.o. male with medical history significant for but not limited to abnormal LFTs, IgG kappa myeloma status post induction chemotherapy with RVD followed by an autologous stem cell transplant at [facility] was currently undergoing chemotherapy treatment as well as a history of anemia and other comorbidities who presented with a chief complaint of significant weakness, fatigue and fever.  Patient had recently gone to the beach and started feeling bad Saturday evening and then felt bad Sunday morning and Monday and then went to the local ED where they found him to be neutropenic.  They offered him admission but he declined and they sent him home with antibiotics with Levaquin and they give him a shot of Neupogen.  Patient started feeling better yesterday but then this morning when he woke up he felt terrible and drove directly from the beach to [facility] ED.  Patient sees Dr. [name] as his hematologist oncologist and hematology oncology were contacted and Dr. [name] recommended placing the patient on IV cefepime as well as giving him a dose of Granix tonight.  Patient continues to feel better but denies any nausea or real vomiting.  States that he has some slight diarrhea but thinks it is from not from eating properly.  His appetite been extremely poor he is not eating very much the last few days.  He denies any burning or discomfort in his urine, chest pain or shortness of breath.  Patient states that he just does not feel good and feels extremely weak.  Upon arrival to the ED patient was afebrile but patient states that he has been subjectively febrile and not been able to take his temperature given that the rental vacation property he was staying and did not have a thermometer.  Patient has been taking acetaminophen though as an outpatient.  Further work-up in the ED  showed that labs that he was pancytopenic and that his neutrophil count was 500.  Patient was also noted to have abnormal LFTs, and a elevated creatinine up from a week ago where it was 1.43.  Patient is being admitted for neutropenic fever and pancytopenia with acute on chronic kidney disease and abnormal LFTs.   Oncology has been consulted and working the patient up further and recommending a CT bone marrow biopsy as well as aspiration as well as obtaining a CT of the chest abdomen and pelvis for further evaluation and recommending stopping the Granix and now  IV cefepime has stopped as well.  Is having quite a bit of diarrhea that is significant will check him for C. difficile as well as a GI pathogen panel given unclear source of infection. C Diff Negative and now GI Pathogen Panel pending.   Assessment and Plan:  Neutropenic fever Pancytopenia -Patient presents with subjective fevers and is significantly pancytopenic now after his chemotherapy; patient was actually febrile overnight and had Tmax of 102.3 the day before yesterday and his ANC is now 500 -Patient has a history of IgG kappa myeloma and is currently on chemotherapy and received it last week with Velcade and Revlimid; he is status post induction chemotherapy with RVD followed by autologous stem cell transplant at [facility] in February 2017 -Presents with subjective fevers, and WBC count of 0.9 and a neutrophil count of 500 at the time of admission; now WBC is 1.8 and ANC is 500 -IVF now  stopped  -Continue with antipyretics -Blood cultures x2 show NGTD at 2 Days -Patient continues to have a pancytopenia and WBC went from 0.9 is now 1.1 -> 1.8 and hemoglobin/hematocrit has gone from 12.8/35.6 -> 11.0/31.1 -> 10.4/29.3 with an MCV of 100.3 and platelet count has dropped from 27 -> 21 -> 27 -Urinalysis not really remarkable for infection and chest x-ray showed no acute cardiopulmonary disease; Urine Cx showing no Growth  -Unclear etiology of  where his fever and source is but he did state that he had a little bit of diarrhea.  Patient admits to having very poor p.o. intake last few days.  Diarrhea is worsening so we will check this for infectious diarrhea and obtain a GI pathogen panel and C. Difficile; patient states that he has been on antibiotics in the last few months and received a course of antibiotics for sinusitis in [place] late June and received some Levaquin recently when he was in the ED in [place], [place] oncology has stopped his aspirin given his thrombocytopenia and also his subcu heparin -LDH is now 308 -Empirically started IV cefepime and now Oncology has stopped this (Received 2 doses) -Lactic acid level went from 1.6 and 1.0 -Patient received Granix initially  but now hematology oncology recommending holding Neupogen given that there is a potential for underlying leukemia and they do not want the Neupogen to stimulate this -Medical oncology recommending getting CT of the chest abdomen pelvis as well as a CT bone marrow biopsy and aspiration; CT bone marrow biopsy and aspiration is scheduled for Tuesday 11/17/21 -CT Chest/Abd/Pelvis done and showed "No evidence of active myeloma on CT chest abdomen pelvis. Lucent lesions in the sternum unchanged from comparison PET-CT scan 2016. No plasmacytoma. Stable mild splenomegaly." -Further care per medical oncology   IgG Kappa Myeloma -Hold his chemotherapy and hematology oncology has been notified -Patient sees Dr. [name] in the outpatient setting and recently had chemotherapy last week -Appreciate Dr. [name] evaluating today and plan is as above getting a CT of the chest abdomen pelvis as well as a CT bone marrow biopsy and aspiration but this is likely to be done on 11/17/21   AKI on CKD stage IIIa Metabolic Acidosis  -Patient's BUNs/creatinine went from 11/1.43 -> 15/1.88 -> 16/1.57 -> 8/1.36 -Given IV fluid hydration with 2 L boluses and started  on maintenance IV fluid hydration with normal saline at 100 MLS per hour which is now stopped -Patient has a mild Acidosis with a CO2 of 19, AG of 7, and Chloride Level of 112 -We will need to avoid nephrotoxic medications, contrast dyes, hypotension and dehydration to ensure adequate renal perfusion and will need to renally adjust medications -Repeat CMP in the a.m.   Abnormal LFTs -AST and ALT have worsened but he does have a history of abnormal LFTs -AST went from 66 -> 100 -> 88 -> 75 -ALT went from 58 -> 145 -> 122 -> 119 -Continue to Monitor and Trend and if Necessary will obtain a RUQ U/S and an Acute Hepatitis Panle    Hypophosphatemia -Patient's Phos level was 1.9 and improved to 2.4 -Replete with p.o. K-Phos Neutral 500 mg x1  -Continue monitor and replete as necessary -Repeat Phos level in a.m.   Hypoalbuminemia -Patient's albumin level went from 3.8 is now 3.3 -> 3.0 -Continue to monitor and trend and repeat CMP in a.m.   Hyperglycemia -Patient's blood sugar on admission was 108 and repeat this morning was 98 -Check hemoglobin  A1c in the AM -Continue to monitor blood sugars carefully and if necessary will need to place on sensitive NovoLog/scale insulin   Hyponatremia -Likely in the setting of his dehydration -Patient's sodium is now gone from 133 -> 134 -> 138 -Started IV fluid hydration as above We will continue to monitor and trend and repeat CMP in a.m.   Hx of Tick Bite -RMSF IgG is Negative and [place] Spotted Fever, IgM 0.20  DVT prophylaxis: Place and maintain sequential compression device Start: 11/13/21 0652 SCDs Start: 11/13/21 0007    Code Status: Full Code Family Communication: No family present at bedside   Disposition Plan:  Level of care: Telemetry Status is: Inpatient Remains inpatient appropriate because: Needs Medical Oncology Clearance; Maybe able to D/C'd if feels better and maybe able to do outpatient Bx   Consultants:  Medical  Oncology Interventional Radiology   Procedures:  CT Chest/Abd/Pelvis   Antimicrobials:  Anti-infectives (From admission, onward)    Start     Dose/Rate Route Frequency Ordered Stop   11/13/21 0400  ceFEPIme (MAXIPIME) 2 g in sodium chloride 0.9 % 100 mL IVPB  Status:  Discontinued        2 g 200 mL/hr over 30 Minutes Intravenous Every 12 hours 11/12/21 1809 11/14/21 0733   11/13/21 0015  valACYclovir (VALTREX) tablet 500 mg        500 mg Oral 2 times daily 11/13/21 0006     11/13/21 0015  ceFEPIme (MAXIPIME) 2 g in sodium chloride 0.9 % 100 mL IVPB  Status:  Discontinued        2 g 200 mL/hr over 30 Minutes Intravenous Every 8 hours 11/13/21 0006 11/13/21 0009   11/12/21 1615  ceFEPIme (MAXIPIME) 2 g in sodium chloride 0.9 % 100 mL IVPB        2 g 200 mL/hr over 30 Minutes Intravenous  Once 11/12/21 1605 11/12/21 1707       Subjective: Seen and examined at bedside and he states that he was feeling better and slept better.  States he ate all of his breakfast.  A little discouraged that his bone marrow biopsy has been bumped to Tuesday.  No chest pain or shortness of breath.  Still having some diarrhea but thinks it is slowing down.  No other concerns or complaints at this time.  Objective: Vitals:   11/13/21 1700 11/13/21 2150 11/14/21 0440 11/14/21 0514  BP: 115/72 116/74  110/71  Pulse: 77 63  80  Resp: '18 18  18  ' Temp: 98.9 F (37.2 C) 98.9 F (37.2 C)  98.9 F (37.2 C)  TempSrc: Oral Oral  Oral  SpO2: 98% 96%  95%  Weight:   93.5 kg   Height:        Intake/Output Summary (Last 24 hours) at 11/14/2021 3832 Last data filed at 11/14/2021 9191 Gross per 24 hour  Intake 1703.74 ml  Output --  Net 1703.74 ml   Filed Weights   11/12/21 1255 11/13/21 0636 11/14/21 0440  Weight: 97.1 kg 91.6 kg 93.5 kg   Examination: Physical Exam:  Constitutional: WN/WD Caucasian male currently no acute distress Respiratory: Diminished to auscultation bilaterally, no wheezing,  rales, rhonchi or crackles. Normal respiratory effort and patient is not tachypenic. No accessory muscle use.  Cardiovascular: RRR, no murmurs / rubs / gallops. S1 and S2 auscultated. No extremity edema. 2+ pedal pulses. No carotid bruits.  Abdomen: Soft, non-tender, slightly distended secondary to body habitus. Bowel sounds positive.  GU: Deferred.  Musculoskeletal: No clubbing / cyanosis of digits/nails. No joint deformity upper and lower extremities.  Skin: No rashes, lesions, ulcers on limited skin evaluation. No induration; Warm and dry.  Neurologic: CN 2-12 grossly intact with no focal deficits. Romberg sign and cerebellar reflexes not assessed.  Psychiatric: Normal judgment and insight. Alert and oriented x 3. Normal mood and appropriate affect.   Data Reviewed: I have personally reviewed following labs and imaging studies  CBC: Recent Labs  Lab 11/12/21 1321 11/13/21 0007 11/13/21 0550 11/14/21 0629  WBC 0.9* 0.9* 1.1* 1.8*  NEUTROABS 0.5*  --  0.7* 0.5*  HGB 12.8* 10.9* 11.0* 10.4*  HCT 35.6* 30.2* 31.1* 29.3*  MCV 100.6* 101.7* 102.0* 100.3*  PLT 27* 23* 21* 27*   Basic Metabolic Panel: Recent Labs  Lab 11/12/21 1321 11/13/21 0007 11/13/21 0550 11/14/21 0629  NA 133*  --  134* 138  K 3.8  --  3.9 3.7  CL 101  --  107 112*  CO2 23  --  22 19*  GLUCOSE 108*  --  98 100*  BUN 15  --  16 8  CREATININE 1.88* 1.81* 1.57* 1.36*  CALCIUM 8.3*  --  7.8* 7.6*  MG  --   --  1.7 2.2  PHOS  --   --  1.9* 2.4*   GFR: Estimated Creatinine Clearance: 67.7 mL/min (A) (by C-G formula based on SCr of 1.36 mg/dL (H)). Liver Function Tests: Recent Labs  Lab 11/12/21 1321 11/13/21 0550 11/14/21 0629  AST 100* 88* 75*  ALT 145* 122* 119*  ALKPHOS 80 61 55  BILITOT 1.2 1.0 0.7  PROT 7.5 6.2* 5.2*  ALBUMIN 3.8 3.3* 3.0*   No results for input(s): "LIPASE", "AMYLASE" in the last 168 hours. No results for input(s): "AMMONIA" in the last 168 hours. Coagulation  Profile: Recent Labs  Lab 11/12/21 1321  INR 1.1   Cardiac Enzymes: No results for input(s): "CKTOTAL", "CKMB", "CKMBINDEX", "TROPONINI" in the last 168 hours. BNP (last 3 results) No results for input(s): "PROBNP" in the last 8760 hours. HbA1C: No results for input(s): "HGBA1C" in the last 72 hours. CBG: Recent Labs  Lab 11/12/21 1256 11/13/21 0723 11/14/21 0748  GLUCAP 94 100* 99   Lipid Profile: No results for input(s): "CHOL", "HDL", "LDLCALC", "TRIG", "CHOLHDL", "LDLDIRECT" in the last 72 hours. Thyroid Function Tests: Recent Labs    11/13/21 0550  TSH 1.910   Anemia Panel: No results for input(s): "VITAMINB12", "FOLATE", "FERRITIN", "TIBC", "IRON", "RETICCTPCT" in the last 72 hours. Sepsis Labs: Recent Labs  Lab 11/12/21 1321 11/13/21 0550  LATICACIDVEN 1.6 1.0    Recent Results (from the past 240 hour(s))  SARS Coronavirus 2 by RT PCR (hospital order, performed in [facility] hospital lab) *cepheid single result test* Anterior Nasal Swab     Status: None   Collection Time: 11/12/21  1:22 PM   Specimen: Anterior Nasal Swab  Result Value Ref Range Status   SARS Coronavirus 2 by RT PCR NEGATIVE NEGATIVE Final    Comment: (NOTE) SARS-CoV-2 target nucleic acids are NOT DETECTED.  The SARS-CoV-2 RNA is generally detectable in upper and lower respiratory specimens during the acute phase of infection. The lowest concentration of SARS-CoV-2 viral copies this assay can detect is 250 copies / mL. A negative result does not preclude SARS-CoV-2 infection and should not be used as the sole basis for treatment or other patient management decisions.  A negative result may occur with improper specimen collection / handling, submission of  specimen other than nasopharyngeal swab, presence of viral mutation(s) within the areas targeted by this assay, and inadequate number of viral copies (<250 copies / mL). A negative result must be combined with clinical observations,  patient history, and epidemiological information.  Fact Sheet for Patients:   https://www.patel.info/  Fact Sheet for Healthcare Providers: https://hall.com/  This test is not yet approved or  cleared by the [place] FDA and has been authorized for detection and/or diagnosis of SARS-CoV-2 by FDA under an Emergency Use Authorization (EUA).  This EUA will remain in effect (meaning this test can be used) for the duration of the COVID-19 declaration under Section 564(b)(1) of the Act, 21 U.S.C. section 360bbb-3(b)(1), unless the authorization is terminated or revoked sooner.  Performed at [facility], [place] [address] [place], [place] [address]   Blood Culture (routine x 2)     Status: None (Preliminary result)   Collection Time: 11/12/21  1:26 PM   Specimen: BLOOD  Result Value Ref Range Status   Specimen Description   Final    BLOOD RIGHT ANTECUBITAL Performed at [facility] [address] [place], [place] [address]    Special Requests   Final    BOTTLES DRAWN AEROBIC AND ANAEROBIC Blood Culture adequate volume Performed at [facility] [address], [place], [place] [address]    Culture   Final    NO GROWTH 2 DAYS Performed at [facility] [address], [place], [place] [address]    Report Status PENDING  Incomplete  Urine Culture     Status: None   Collection Time: 11/12/21  1:31 PM   Specimen: In/Out Cath Urine  Result Value Ref Range Status   Specimen Description   Final    IN/OUT CATH URINE Performed at [facility] [address], [place], [place] [address]    Special Requests   Final    NONE Performed at [facility], [place] [address], [place], [place] [address]    Culture   Final    NO GROWTH Performed at [facility], [place] [address], [place], [place] [address]    Report Status 11/13/2021 FINAL   Final  Blood Culture (routine x 2)     Status: None (Preliminary result)   Collection Time: 11/12/21  1:36 PM   Specimen: BLOOD  Result Value Ref Range Status   Specimen Description   Final    BLOOD LEFT ANTECUBITAL Performed at [facility] [address], [place], [place] [address]    Special Requests   Final    BOTTLES DRAWN AEROBIC AND ANAEROBIC Blood Culture adequate volume Performed at [facility] [address], [place], [place] [address]    Culture   Final    NO GROWTH 2 DAYS Performed at [facility] [address], [place], [place] [address]    Report Status PENDING  Incomplete  C Difficile Quick Screen w PCR reflex     Status: None   Collection Time: 11/13/21 10:59 AM   Specimen: STOOL  Result Value Ref Range Status   C Diff antigen NEGATIVE NEGATIVE Final   C Diff toxin NEGATIVE NEGATIVE Final   C Diff interpretation No C. difficile detected.  Final    Comment: Performed at [facility], [place] [address], [place], [place] [address]    Radiology Studies: CT CHEST ABDOMEN PELVIS W CONTRAST  Result Date: 11/13/2021 CLINICAL  DATA:  Hematologic malignancy. Pancytopenia. Myeloma. * Tracking Code: BO * EXAM: CT CHEST, ABDOMEN, AND PELVIS WITH CONTRAST TECHNIQUE: Multidetector CT imaging of the chest, abdomen and pelvis was performed following the standard protocol during bolus administration of intravenous contrast. RADIATION DOSE REDUCTION: This exam was performed according to the departmental dose-optimization program which includes automated exposure control, adjustment of the mA and/or kV according to patient size and/or use of iterative reconstruction technique. CONTRAST:  58m OMNIPAQUE IOHEXOL 300 MG/ML  SOLN COMPARISON:  FDG PET scan 07/10/2014 FINDINGS: CT CHEST FINDINGS Cardiovascular: No significant vascular findings. Normal heart size. No pericardial effusion. Mediastinum/Nodes: No axillary or  supraclavicular adenopathy. No mediastinal or hilar adenopathy. No pericardial fluid. Esophagus normal. Lungs/Pleura: No suspicious pulmonary nodules. Normal pleural. Airways normal. Musculoskeletal: Lucent lesion centrally within the manubrium measuring 12 mm not changed from comparison PET-CT scan 2016. A similar lucent 9 mm lesion in the sternum on image 67 unchanged. CT ABDOMEN AND PELVIS FINDINGS Hepatobiliary: No focal hepatic lesion. No biliary ductal dilatation. Gallbladder is normal. Common bile duct is normal. Pancreas: Pancreas is normal. No ductal dilatation. No pancreatic inflammation. Spleen: Spleen is mildly enlarged measuring 13.9 x 6.2 x 12.6 cm (volume = 570 cm^3). Calculated volume in 2016 equal 687.) Adrenals/urinary tract: Adrenal glands and kidneys are normal. The ureters and bladder normal. Stomach/Bowel: Stomach, small bowel, appendix, and cecum are normal. The colon and rectosigmoid colon are normal. Vascular/Lymphatic: Abdominal aorta is normal caliber. There is no retroperitoneal or periportal lymphadenopathy. No pelvic lymphadenopathy. Reproductive: Prostate unremarkable Other: No free fluid. Musculoskeletal: No aggressive osseous lesion. IMPRESSION: 1. No evidence of active myeloma on CT chest abdomen pelvis. 2. Lucent lesions in the sternum unchanged from comparison PET-CT scan 2016. 3. No plasmacytoma. 4. Stable mild splenomegaly. Electronically Signed   By: SSuzy BouchardM.D.   On: 11/13/2021 11:25   DG Chest 2 View  Result Date: 11/12/2021 CLINICAL DATA:  Questionable sepsis - evaluate for abnormality EXAM: CHEST - 2 VIEW COMPARISON:  04/10/2020 FINDINGS: The heart size and mediastinal contours are within normal limits. No focal airspace consolidation, pleural effusion, or pneumothorax. The visualized skeletal structures are unremarkable. IMPRESSION: No active cardiopulmonary disease. Electronically Signed   By: NDavina PokeD.O.   On: 11/12/2021 14:12    Scheduled  Meds:  cetirizine  10 mg Oral Daily   feeding supplement  1 Container Oral BID BM   multivitamin with minerals  1 tablet Oral Daily   phosphorus  500 mg Oral Once   valACYclovir  500 mg Oral BID   Continuous Infusions:  sodium chloride 100 mL/hr at 11/14/21 0523    LOS: 2 days   ORaiford Noble DO [facility] Available via Epic secure chat 7am-7pm After these hours, please refer to coverage provider listed on [email or web address] 11/14/2021, 9:38 AM

## 2021-11-14 NOTE — Progress Notes (Signed)
[name] is feeling a lot better.  His cultures are negative.  His white cell count actually is up a little bit more.  His platelet count is also up a little bit more.  Unfortunately, Interventional Radiology will be able to do a bone marrow test until Tuesday at the earliest.  He has had no temperature.  He is having little bit of diarrhea.  C. difficile has been negative.  He slept all night long.  He feels a lot better.  I am still not sure as to what is going on.  I suppose this could be from his treatments.  It could be from the Revlimid.  He has been on this for quite a while.  Again, back on 11/05/2021, his platelet count was normal at 140,000.  Maybe, we are just seeing a profound effect of his Revlimid.  I still think that a bone marrow biopsy would be helpful.  I do think though that if his blood counts improve over the weekend, and that he is afebrile, we might be able to do everything as an outpatient and try to get him home.  His vital signs are all stable.  Temperature 98.9.  Pulse 80.  Blood pressure 110/71.  There really is no change in his overall physical exam.  Hopefully, we might be seen a turnaround for his blood counts.  It will be very interesting to see what his labs look like tomorrow.  I do appreciate the outstanding care that he is getting from all the staff up on [address].  Lattie Haw, MD  Philippians 4:6

## 2021-11-15 ENCOUNTER — Encounter (HOSPITAL_COMMUNITY): Payer: Self-pay | Admitting: Internal Medicine

## 2021-11-15 DIAGNOSIS — D61810 Antineoplastic chemotherapy induced pancytopenia: Secondary | ICD-10-CM | POA: Diagnosis not present

## 2021-11-15 DIAGNOSIS — D709 Neutropenia, unspecified: Secondary | ICD-10-CM | POA: Diagnosis not present

## 2021-11-15 DIAGNOSIS — Z9481 Bone marrow transplant status: Secondary | ICD-10-CM | POA: Diagnosis not present

## 2021-11-15 DIAGNOSIS — N179 Acute kidney failure, unspecified: Secondary | ICD-10-CM | POA: Diagnosis not present

## 2021-11-15 LAB — COMPREHENSIVE METABOLIC PANEL
ALT: 126 U/L — ABNORMAL HIGH (ref 0–44)
AST: 74 U/L — ABNORMAL HIGH (ref 15–41)
Albumin: 3.2 g/dL — ABNORMAL LOW (ref 3.5–5.0)
Alkaline Phosphatase: 62 U/L (ref 38–126)
Anion gap: 8 (ref 5–15)
BUN: 12 mg/dL (ref 6–20)
CO2: 21 mmol/L — ABNORMAL LOW (ref 22–32)
Calcium: 8 mg/dL — ABNORMAL LOW (ref 8.9–10.3)
Chloride: 109 mmol/L (ref 98–111)
Creatinine, Ser: 1.1 mg/dL (ref 0.61–1.24)
GFR, Estimated: >60 mL/min (ref >60)
Glucose, Bld: 94 mg/dL (ref 70–99)
Potassium: 3.4 mmol/L — ABNORMAL LOW (ref 3.5–5.1)
Sodium: 138 mmol/L (ref 135–145)
Total Bilirubin: 0.6 mg/dL (ref 0.3–1.2)
Total Protein: 6 g/dL — ABNORMAL LOW (ref 6.5–8.1)

## 2021-11-15 LAB — CBC WITH DIFFERENTIAL/PLATELET
Abs Immature Granulocytes: 0.01 10*3/uL (ref 0.00–0.07)
Basophils Absolute: 0 10*3/uL (ref 0.0–0.1)
Basophils Relative: 1 %
Eosinophils Absolute: 0 10*3/uL (ref 0.0–0.5)
Eosinophils Relative: 0 %
HCT: 29.8 % — ABNORMAL LOW (ref 39.0–52.0)
Hemoglobin: 10.6 g/dL — ABNORMAL LOW (ref 13.0–17.0)
Immature Granulocytes: 0 %
Lymphocytes Relative: 71 %
Lymphs Abs: 2 10*3/uL (ref 0.7–4.0)
MCH: 35.7 pg — ABNORMAL HIGH (ref 26.0–34.0)
MCHC: 35.6 g/dL (ref 30.0–36.0)
MCV: 100.3 fL — ABNORMAL HIGH (ref 80.0–100.0)
Monocytes Absolute: 0.2 10*3/uL (ref 0.1–1.0)
Monocytes Relative: 7 %
Neutro Abs: 0.6 10*3/uL — ABNORMAL LOW (ref 1.7–7.7)
Neutrophils Relative %: 21 %
Platelets: 36 10*3/uL — ABNORMAL LOW (ref 150–400)
RBC: 2.97 MIL/uL — ABNORMAL LOW (ref 4.22–5.81)
RDW: 13.2 % (ref 11.5–15.5)
WBC: 2.8 10*3/uL — ABNORMAL LOW (ref 4.0–10.5)
nRBC: 0 % (ref 0.0–0.2)

## 2021-11-15 LAB — GLUCOSE, CAPILLARY: Glucose-Capillary: 95 mg/dL (ref 70–99)

## 2021-11-15 LAB — PHOSPHORUS: Phosphorus: 3.3 mg/dL (ref 2.5–4.6)

## 2021-11-15 LAB — MAGNESIUM: Magnesium: 2.1 mg/dL (ref 1.7–2.4)

## 2021-11-15 MED ORDER — POTASSIUM CHLORIDE CRYS ER 20 MEQ PO TBCR
40.0000 meq | EXTENDED_RELEASE_TABLET | Freq: Two times a day (BID) | ORAL | Status: AC
  Administered 2021-11-15 (×2): 40 meq via ORAL
  Filled 2021-11-15 (×2): qty 2

## 2021-11-15 NOTE — Progress Notes (Signed)
PROGRESS NOTE    Tyler Phillips.  HGD:[id number] DOB: 03-29-1966 DOA: 11/12/2021 PCP: [name], MD   Brief Narrative:  Tyler Phillips. is a 56 y.o. male with medical history significant for but not limited to abnormal LFTs, IgG kappa myeloma status post induction chemotherapy with RVD followed by an autologous stem cell transplant at [facility] was currently undergoing chemotherapy treatment as well as a history of anemia and other comorbidities who presented with a chief complaint of significant weakness, fatigue and fever.  Patient had recently gone to the beach and started feeling bad Saturday evening and then felt bad Sunday morning and Monday and then went to the local ED where they found him to be neutropenic.  They offered him admission but he declined and they sent him home with antibiotics with Levaquin and they give him a shot of Neupogen.  Patient started feeling better yesterday but then this morning when he woke up he felt terrible and drove directly from the beach to [facility] ED.  Patient sees Dr. [name] as his hematologist oncologist and hematology oncology were contacted and Dr. [name] recommended placing the patient on IV cefepime as well as giving him a dose of Granix tonight.  Patient continues to feel better but denies any nausea or real vomiting.  States that he has some slight diarrhea but thinks it is from not from eating properly.  His appetite been extremely poor he is not eating very much the last few days.  He denies any burning or discomfort in his urine, chest pain or shortness of breath.  Patient states that he just does not feel good and feels extremely weak.  Upon arrival to the ED patient was afebrile but patient states that he has been subjectively febrile and not been able to take his temperature given that the rental vacation property he was staying and did not have a thermometer.  Patient has been taking acetaminophen though as an outpatient.  Further work-up in the ED  showed that labs that he was pancytopenic and that his neutrophil count was 500.  Patient was also noted to have abnormal LFTs, and a elevated creatinine up from a week ago where it was 1.43.  Patient is being admitted for neutropenic fever and pancytopenia with acute on chronic kidney disease and abnormal LFTs.   Oncology has been consulted and working the patient up further and recommending a CT bone marrow biopsy as well as aspiration as well as obtaining a CT of the chest abdomen and pelvis for further evaluation and recommending stopping the Granix and now  IV cefepime has stopped as well.  Is having quite a bit of diarrhea that is significant will check him for C. difficile as well as a GI pathogen panel given unclear source of infection. C Diff Negative and now GI Pathogen Panel is also Negative.  His labs are improving slowly and plan is for biopsy still.  We will need to discuss with oncology about possible discharge with outpatient biopsy if he continues to improve.   Assessment and Plan:  Neutropenic fever, slowly improving Pancytopenia, improving -Patient presents with subjective fevers and is significantly pancytopenic now after his chemotherapy; patient was actually febrile overnight and had Tmax of 102.3 the day before yesterday and his ANC is now 500 -Patient has a history of IgG kappa myeloma and is currently on chemotherapy and received it last week with Velcade and Revlimid; he is status post induction chemotherapy with RVD followed by autologous stem cell  transplant at [facility] in February 2017 -Presents with subjective fevers, and WBC count of 0.9 and a neutrophil count of 500 at the time of admission; now WBC is 1.8 and ANC is 500 -IVF now stopped  -Continue with antipyretics -Blood cultures x2 show NGTD at 3 Days -Patient continues to have a pancytopenia and WBC went from 0.9 is now 1.1 -> 1.8 -> 2.8 and hemoglobin/hematocrit has gone from 12.8/35.6 -> 11.0/31.1 -> 10.4/29.3 ->  10.6/29.8 with an MCV of 100.3 and platelet count has dropped from 27 -> 21 -> 27 -> 36 -Urinalysis not really remarkable for infection and chest x-ray showed no acute cardiopulmonary disease; Urine Cx showing no Growth  -Unclear etiology of where his fever and source is but he did state that he had a little bit of diarrhea.  Patient admits to having very poor p.o. intake last few days.  Diarrhea is worsening so we will check this for infectious diarrhea and obtain a GI pathogen panel and C. Difficile; patient states that he has been on antibiotics in the last few months and received a course of antibiotics for sinusitis in [place] late June and received some Levaquin recently when he was in the ED in [place], [place] oncology has stopped his aspirin given his thrombocytopenia and also his subcu heparin -LDH is now 308 -Empirically started IV cefepime and now Oncology has stopped this (Received 2 doses) -Lactic acid level went from 1.6 and 1.0 -Patient received Granix initially  but now hematology oncology recommending holding Neupogen given that there is a potential for underlying leukemia and they do not want the Neupogen to stimulate this -Medical oncology recommending getting CT of the chest abdomen pelvis as well as a CT bone marrow biopsy and aspiration; CT bone marrow biopsy and aspiration is scheduled for Tuesday 11/17/21 -CT Chest/Abd/Pelvis done and showed "No evidence of active myeloma on CT chest abdomen pelvis. Lucent lesions in the sternum unchanged from comparison PET-CT scan 2016. No plasmacytoma. Stable mild splenomegaly." -Further care per medical oncology and I tried to call Dr. [name] today and he is off so we will touch base with them again tomorrow about possibly discharging the patient home with outpatient biopsy if he continues to improve   IgG Kappa Myeloma -Hold his chemotherapy and hematology oncology has been notified -Patient sees Dr. [name] in  the outpatient setting and recently had chemotherapy last week -Appreciate Dr. [name] evaluating today and plan is as above getting a CT of the chest abdomen pelvis as well as a CT bone marrow biopsy and aspiration but this is likely to be done on 11/17/21   AKI on CKD stage IIIa Metabolic Acidosis  -Patient's BUNs/creatinine went from 11/1.43 -> 15/1.88 -> 16/1.57 -> 8/1.36 -> 12/1.10 -Given IV fluid hydration with 2 L boluses and started on maintenance IV fluid hydration with normal saline at 100 MLS per hour which is now stopped -Patient has a mild Acidosis with a CO2 of 19, AG of 7, and Chloride Level of 112 and this is now improving and his CO2 is now 21, anion gap is 8, chloride level is 109 -We will need to avoid nephrotoxic medications, contrast dyes, hypotension and dehydration to ensure adequate renal perfusion and will need to renally adjust medications -Repeat CMP in the a.m.  Hypokalemia -Patient's potassium is now 3.4 -Replete with po Kcl 40 mEQ BID x2 -Continue to Monitor and Replete as Necessary -Repeat CMP in the AM    Abnormal LFTs -AST  and ALT have worsened but he does have a history of abnormal LFTs -AST went from 66 -> 100 -> 88 -> 75 -> 74 -ALT went from 58 -> 145 -> 122 -> 119 -> 126 -Continue to Monitor and Trend and if Necessary will obtain a RUQ U/S and an Acute Hepatitis Panle    Hypophosphatemia -Patient's Phos level is now 3.3 -Continue monitor and replete as necessary -Repeat Phos level in a.m.   Hypoalbuminemia -Patient's albumin level went from 3.8 is now 3.3 -> 3.0 -> 3.2 -Continue to monitor and trend and repeat CMP in a.m.   Hyperglycemia -Patient's blood sugar on admission was 108 and repeat this morning was 94 -Check hemoglobin A1c in the AM -Continue to monitor blood sugars carefully and if necessary will need to place on sensitive NovoLog/scale insulin   Hyponatremia -Likely in the setting of his dehydration -Patient's sodium is now gone  from 133 -> 134 -> 138 x2 -Started IV fluid hydration as above We will continue to monitor and trend and repeat CMP in a.m.   Hx of Tick Bite -RMSF IgG is Negative and [place] Spotted Fever, IgM 0.20  DVT prophylaxis: Place and maintain sequential compression device Start: 11/13/21 0652 SCDs Start: 11/13/21 0007    Code Status: Full Code Family Communication: No family present at bedside   Disposition Plan:  Level of care: Telemetry Status is: Inpatient Remains inpatient appropriate because: Needs medical oncology clearance prior to safe discharge disposition the patient is improving   Consultants:  Medical Oncology Interventional Radiology  Procedures:  See Above  Antimicrobials:  Anti-infectives (From admission, onward)    Start     Dose/Rate Route Frequency Ordered Stop   11/13/21 0400  ceFEPIme (MAXIPIME) 2 g in sodium chloride 0.9 % 100 mL IVPB  Status:  Discontinued        2 g 200 mL/hr over 30 Minutes Intravenous Every 12 hours 11/12/21 1809 11/14/21 0733   11/13/21 0015  valACYclovir (VALTREX) tablet 500 mg        500 mg Oral 2 times daily 11/13/21 0006     11/13/21 0015  ceFEPIme (MAXIPIME) 2 g in sodium chloride 0.9 % 100 mL IVPB  Status:  Discontinued        2 g 200 mL/hr over 30 Minutes Intravenous Every 8 hours 11/13/21 0006 11/13/21 0009   11/12/21 1615  ceFEPIme (MAXIPIME) 2 g in sodium chloride 0.9 % 100 mL IVPB        2 g 200 mL/hr over 30 Minutes Intravenous  Once 11/12/21 1605 11/12/21 1707       Subjective: Seen and examined at bedside and he is sitting in a chair and he states he is feeling better.  Diarrhea is improving.  No nausea or vomiting.  Denies any lightheadedness or dizziness.  Asking about maybe doing the CT biopsy in outpatient setting but will need medical oncology clearance for this.  No other concerns or complaints this time.  Objective: Vitals:   11/14/21 0514 11/14/21 1316 11/14/21 1937 11/15/21 0547  BP: 110/71 109/74 118/73  113/65  Pulse: 80 66 73 62  Resp: '18 20 16 16  ' Temp: 98.9 F (37.2 C) 97.7 F (36.5 C) 97.8 F (36.6 C) 97.9 F (36.6 C)  TempSrc: Oral Oral Oral Oral  SpO2: 95% 100% 99% 98%  Weight:      Height:       No intake or output data in the 24 hours ending 11/15/21 Dante  11/12/21 1255 11/13/21 0636 11/14/21 0440  Weight: 97.1 kg 91.6 kg 93.5 kg    Examination: Physical Exam:  Constitutional: WN/WD overweight Caucasian male currently no acute distress Respiratory: Diminished to auscultation bilaterally with coarse breath sounds, no wheezing, rales, rhonchi or crackles. Normal respiratory effort and patient is not tachypenic. No accessory muscle use.  Unlabored breathing not wearing supplemental oxygen via nasal cannula Cardiovascular: RRR, no murmurs / rubs / gallops. S1 and S2 auscultated. No extremity edema.  Abdomen: Soft, non-tender, slightly distended secondary body habitus. Bowel sounds positive.  GU: Deferred. Musculoskeletal: No clubbing / cyanosis of digits/nails. No joint deformity upper and lower extremities. Skin: No rashes, lesions, ulcers on a limited skin evaluation. No induration; Warm and dry.  Neurologic: CN 2-12 grossly intact with no focal deficits. Romberg sign and cerebellar reflexes not assessed.  Psychiatric: Normal judgment and insight. Alert and oriented x 3. Normal mood and appropriate affect.   Data Reviewed: I have personally reviewed following labs and imaging studies  CBC: Recent Labs  Lab 11/12/21 1321 11/13/21 0007 11/13/21 0550 11/14/21 0629 11/15/21 0554  WBC 0.9* 0.9* 1.1* 1.8* 2.8*  NEUTROABS 0.5*  --  0.7* 0.5* 0.6*  HGB 12.8* 10.9* 11.0* 10.4* 10.6*  HCT 35.6* 30.2* 31.1* 29.3* 29.8*  MCV 100.6* 101.7* 102.0* 100.3* 100.3*  PLT 27* 23* 21* 27* 36*   Basic Metabolic Panel: Recent Labs  Lab 11/12/21 1321 11/13/21 0007 11/13/21 0550 11/14/21 0629 11/15/21 0554  NA 133*  --  134* 138 138  K 3.8  --  3.9 3.7 3.4*  CL  101  --  107 112* 109  CO2 23  --  22 19* 21*  GLUCOSE 108*  --  98 100* 94  BUN 15  --  '16 8 12  ' CREATININE 1.88* 1.81* 1.57* 1.36* 1.10  CALCIUM 8.3*  --  7.8* 7.6* 8.0*  MG  --   --  1.7 2.2 2.1  PHOS  --   --  1.9* 2.4* 3.3   GFR: Estimated Creatinine Clearance: 83.7 mL/min (by C-G formula based on SCr of 1.1 mg/dL). Liver Function Tests: Recent Labs  Lab 11/12/21 1321 11/13/21 0550 11/14/21 0629 11/15/21 0554  AST 100* 88* 75* 74*  ALT 145* 122* 119* 126*  ALKPHOS 80 61 55 62  BILITOT 1.2 1.0 0.7 0.6  PROT 7.5 6.2* 5.2* 6.0*  ALBUMIN 3.8 3.3* 3.0* 3.2*   No results for input(s): "LIPASE", "AMYLASE" in the last 168 hours. No results for input(s): "AMMONIA" in the last 168 hours. Coagulation Profile: Recent Labs  Lab 11/12/21 1321  INR 1.1   Cardiac Enzymes: No results for input(s): "CKTOTAL", "CKMB", "CKMBINDEX", "TROPONINI" in the last 168 hours. BNP (last 3 results) No results for input(s): "PROBNP" in the last 8760 hours. HbA1C: No results for input(s): "HGBA1C" in the last 72 hours. CBG: Recent Labs  Lab 11/12/21 1256 11/13/21 0723 11/14/21 0748 11/15/21 0726  GLUCAP 94 100* 99 95   Lipid Profile: No results for input(s): "CHOL", "HDL", "LDLCALC", "TRIG", "CHOLHDL", "LDLDIRECT" in the last 72 hours. Thyroid Function Tests: Recent Labs    11/13/21 0550  TSH 1.910   Anemia Panel: No results for input(s): "VITAMINB12", "FOLATE", "FERRITIN", "TIBC", "IRON", "RETICCTPCT" in the last 72 hours. Sepsis Labs: Recent Labs  Lab 11/12/21 1321 11/13/21 0550  LATICACIDVEN 1.6 1.0    Recent Results (from the past 240 hour(s))  SARS Coronavirus 2 by RT PCR (hospital order, performed in [facility] hospital lab) *cepheid single result  test* Anterior Nasal Swab     Status: None   Collection Time: 11/12/21  1:22 PM   Specimen: Anterior Nasal Swab  Result Value Ref Range Status   SARS Coronavirus 2 by RT PCR NEGATIVE NEGATIVE Final    Comment:  (NOTE) SARS-CoV-2 target nucleic acids are NOT DETECTED.  The SARS-CoV-2 RNA is generally detectable in upper and lower respiratory specimens during the acute phase of infection. The lowest concentration of SARS-CoV-2 viral copies this assay can detect is 250 copies / mL. A negative result does not preclude SARS-CoV-2 infection and should not be used as the sole basis for treatment or other patient management decisions.  A negative result may occur with improper specimen collection / handling, submission of specimen other than nasopharyngeal swab, presence of viral mutation(s) within the areas targeted by this assay, and inadequate number of viral copies (<250 copies / mL). A negative result must be combined with clinical observations, patient history, and epidemiological information.  Fact Sheet for Patients:   https://www.patel.info/  Fact Sheet for Healthcare Providers: https://hall.com/  This test is not yet approved or  cleared by the [place] FDA and has been authorized for detection and/or diagnosis of SARS-CoV-2 by FDA under an Emergency Use Authorization (EUA).  This EUA will remain in effect (meaning this test can be used) for the duration of the COVID-19 declaration under Section 564(b)(1) of the Act, 21 U.S.C. section 360bbb-3(b)(1), unless the authorization is terminated or revoked sooner.  Performed at [facility], [place] [address] [place], [place] [address]   Blood Culture (routine x 2)     Status: None (Preliminary result)   Collection Time: 11/12/21  1:26 PM   Specimen: BLOOD  Result Value Ref Range Status   Specimen Description   Final    BLOOD RIGHT ANTECUBITAL Performed at [place] [address], [place], [place] [address]    Special Requests   Final    BOTTLES DRAWN AEROBIC AND ANAEROBIC Blood Culture adequate volume Performed at [place] [address], [place], [place] [address]    Culture   Final    NO GROWTH 3 DAYS Performed at [facility], [place] [address] [place], [place] [address]    Report Status PENDING  Incomplete  Urine Culture     Status: None   Collection Time: 11/12/21  1:31 PM   Specimen: In/Out Cath Urine  Result Value Ref Range Status   Specimen Description   Final    IN/OUT CATH URINE Performed at [place] [address], [place], [place] [address]    Special Requests   Final    NONE Performed at [facility], [place] [address], [place], [place] [address]    Culture   Final    NO GROWTH Performed at [facility], [place] [address] [place], [place] [address]    Report Status 11/13/2021 FINAL  Final  Blood Culture (routine x 2)     Status: None (Preliminary result)   Collection Time: 11/12/21  1:36 PM   Specimen: BLOOD  Result Value Ref Range Status   Specimen Description   Final    BLOOD LEFT ANTECUBITAL Performed at [place] [address] [place], [place] [address]    Special Requests   Final    BOTTLES DRAWN AEROBIC AND ANAEROBIC Blood Culture adequate volume Performed at [place] [address], [place], [place] [address]  Culture   Final    NO GROWTH 3 DAYS Performed at [facility], [place] [address] [place], [place] [address]    Report Status PENDING  Incomplete  C Difficile Quick Screen w PCR reflex     Status: None   Collection Time: 11/13/21 10:59 AM   Specimen: STOOL  Result Value Ref Range Status   C Diff antigen NEGATIVE NEGATIVE Final   C Diff toxin NEGATIVE NEGATIVE Final   C Diff interpretation No C. difficile detected.  Final    Comment: Performed at [facility], [place] [address] [place], [place] [address]  Gastrointestinal Panel by PCR , Stool     Status: None   Collection Time: 11/13/21 10:59 AM   Specimen: STOOL  Result Value Ref Range  Status   Campylobacter species NOT DETECTED NOT DETECTED Final   Plesimonas shigelloides NOT DETECTED NOT DETECTED Final   Salmonella species NOT DETECTED NOT DETECTED Final   Yersinia enterocolitica NOT DETECTED NOT DETECTED Final   Vibrio species NOT DETECTED NOT DETECTED Final   Vibrio cholerae NOT DETECTED NOT DETECTED Final   Enteroaggregative E coli (EAEC) NOT DETECTED NOT DETECTED Final   Enteropathogenic E coli (EPEC) NOT DETECTED NOT DETECTED Final   Enterotoxigenic E coli (ETEC) NOT DETECTED NOT DETECTED Final   Shiga like toxin producing E coli (STEC) NOT DETECTED NOT DETECTED Final   Shigella/Enteroinvasive E coli (EIEC) NOT DETECTED NOT DETECTED Final   Cryptosporidium NOT DETECTED NOT DETECTED Final   Cyclospora cayetanensis NOT DETECTED NOT DETECTED Final   Entamoeba histolytica NOT DETECTED NOT DETECTED Final   Giardia lamblia NOT DETECTED NOT DETECTED Final   Adenovirus F40/41 NOT DETECTED NOT DETECTED Final   Astrovirus NOT DETECTED NOT DETECTED Final   Norovirus GI/GII NOT DETECTED NOT DETECTED Final   Rotavirus A NOT DETECTED NOT DETECTED Final   Sapovirus (I, II, IV, and V) NOT DETECTED NOT DETECTED Final    Comment: Performed at [facility], [address], [place], [place] [address]    Radiology Studies: No results found.  Scheduled Meds:  cetirizine  10 mg Oral Daily   feeding supplement  1 Container Oral BID BM   multivitamin with minerals  1 tablet Oral Daily   potassium chloride  40 mEq Oral BID   valACYclovir  500 mg Oral BID   Continuous Infusions:   LOS: 3 days   [name], DO [facility] Available via Epic secure chat 7am-7pm After these hours, please refer to coverage provider listed on [email or web address] 11/15/2021, 1:15 PM

## 2021-11-16 ENCOUNTER — Ambulatory Visit: Payer: Medicare Other | Admitting: Gastroenterology

## 2021-11-16 ENCOUNTER — Other Ambulatory Visit: Payer: Self-pay

## 2021-11-16 ENCOUNTER — Other Ambulatory Visit (HOSPITAL_COMMUNITY): Payer: Self-pay | Admitting: Hematology & Oncology

## 2021-11-16 DIAGNOSIS — C9001 Multiple myeloma in remission: Secondary | ICD-10-CM

## 2021-11-16 DIAGNOSIS — R5081 Fever presenting with conditions classified elsewhere: Secondary | ICD-10-CM | POA: Diagnosis not present

## 2021-11-16 DIAGNOSIS — R5383 Other fatigue: Secondary | ICD-10-CM | POA: Diagnosis not present

## 2021-11-16 DIAGNOSIS — C9000 Multiple myeloma not having achieved remission: Secondary | ICD-10-CM | POA: Diagnosis not present

## 2021-11-16 DIAGNOSIS — D61810 Antineoplastic chemotherapy induced pancytopenia: Secondary | ICD-10-CM | POA: Diagnosis not present

## 2021-11-16 LAB — CBC WITH DIFFERENTIAL/PLATELET
Abs Immature Granulocytes: 0 10*3/uL (ref 0.00–0.07)
Basophils Absolute: 0 10*3/uL (ref 0.0–0.1)
Basophils Relative: 1 %
Eosinophils Absolute: 0 10*3/uL (ref 0.0–0.5)
Eosinophils Relative: 1 %
HCT: 30.5 % — ABNORMAL LOW (ref 39.0–52.0)
Hemoglobin: 10.8 g/dL — ABNORMAL LOW (ref 13.0–17.0)
Immature Granulocytes: 0 %
Lymphocytes Relative: 75 %
Lymphs Abs: 3.4 10*3/uL (ref 0.7–4.0)
MCH: 35.8 pg — ABNORMAL HIGH (ref 26.0–34.0)
MCHC: 35.4 g/dL (ref 30.0–36.0)
MCV: 101 fL — ABNORMAL HIGH (ref 80.0–100.0)
Monocytes Absolute: 0.3 10*3/uL (ref 0.1–1.0)
Monocytes Relative: 6 %
Neutro Abs: 0.8 10*3/uL — ABNORMAL LOW (ref 1.7–7.7)
Neutrophils Relative %: 17 %
Platelets: 51 10*3/uL — ABNORMAL LOW (ref 150–400)
RBC: 3.02 MIL/uL — ABNORMAL LOW (ref 4.22–5.81)
RDW: 13.3 % (ref 11.5–15.5)
WBC: 4.4 10*3/uL (ref 4.0–10.5)
nRBC: 0 % (ref 0.0–0.2)

## 2021-11-16 LAB — COMPREHENSIVE METABOLIC PANEL
ALT: 134 U/L — ABNORMAL HIGH (ref 0–44)
AST: 74 U/L — ABNORMAL HIGH (ref 15–41)
Albumin: 3.2 g/dL — ABNORMAL LOW (ref 3.5–5.0)
Alkaline Phosphatase: 62 U/L (ref 38–126)
Anion gap: 6 (ref 5–15)
BUN: 12 mg/dL (ref 6–20)
CO2: 22 mmol/L (ref 22–32)
Calcium: 8.5 mg/dL — ABNORMAL LOW (ref 8.9–10.3)
Chloride: 110 mmol/L (ref 98–111)
Creatinine, Ser: 1.06 mg/dL (ref 0.61–1.24)
GFR, Estimated: >60 mL/min (ref >60)
Glucose, Bld: 94 mg/dL (ref 70–99)
Potassium: 4.1 mmol/L (ref 3.5–5.1)
Sodium: 138 mmol/L (ref 135–145)
Total Bilirubin: 0.5 mg/dL (ref 0.3–1.2)
Total Protein: 6.1 g/dL — ABNORMAL LOW (ref 6.5–8.1)

## 2021-11-16 LAB — GLUCOSE, CAPILLARY: Glucose-Capillary: 111 mg/dL — ABNORMAL HIGH (ref 70–99)

## 2021-11-16 MED ORDER — ADULT MULTIVITAMIN W/MINERALS CH: 1.0000 | ORAL_TABLET | Freq: Every day | ORAL | 0 refills | Status: DC

## 2021-11-16 MED ORDER — ONDANSETRON HCL 4 MG PO TABS: 4.0000 mg | ORAL_TABLET | Freq: Four times a day (QID) | ORAL | 0 refills | Status: AC | PRN

## 2021-11-16 MED ORDER — VALACYCLOVIR HCL 500 MG PO TABS: 500.0000 mg | ORAL_TABLET | Freq: Every day | ORAL | Status: DC

## 2021-11-16 NOTE — Plan of Care (Signed)
  Problem: Activity: Goal: Risk for activity intolerance will decrease Outcome: Progressing   Problem: Pain Managment: Goal: General experience of comfort will improve Outcome: Progressing   Problem: Safety: Goal: Ability to remain free from injury will improve Outcome: Progressing   

## 2021-11-16 NOTE — Progress Notes (Signed)
Discharge instructions reviewed with pt. Verbalizes understanding.

## 2021-11-16 NOTE — Discharge Summary (Signed)
Physician Discharge Summary   Patient: Tyler Phillips. MRN: [id number] DOB: Apr 22, 1965  Admit date:     11/12/2021  Discharge date: 11/16/21  Discharge Physician: [name], DO   PCP: [name], MD   Recommendations at discharge:   Follow-up with PCP within 1 to 2 weeks and repeat CBC, CMP, mag, Phos within 1 week Follow-up with medical oncology in the outpatient setting within 1 to 2 weeks and have outpatient bone marrow biopsy done with coordination with interventional radiology  Discharge Diagnoses: Principal Problem:   Neutropenia ([facility]) Active Problems:   Chemotherapy-induced thrombocytopenia   CKD (chronic kidney disease) stage 2, GFR 60-89 ml/min   Bone marrow transplant status ([place])   Antineoplastic chemotherapy induced pancytopenia (H[facility])   IgG multiple myeloma (H[facility])   Macrocytic anemia   Hyponatremia   AKI (acute kidney injury) ([place])   Neutropenia with fever ([place])   Dehydration  Resolved Problems:   * No resolved hospital problems. [facility]: Tyler Phillips. is a 56 y.o. male with medical history significant for but not limited to abnormal LFTs, IgG kappa myeloma status post induction chemotherapy with RVD followed by an autologous stem cell transplant at [facility] was currently undergoing chemotherapy treatment as well as a history of anemia and other comorbidities who presented with a chief complaint of significant weakness, fatigue and fever.  Patient had recently gone to the beach and started feeling bad Saturday evening and then felt bad Sunday morning and Monday and then went to the local ED where they found him to be neutropenic.  They offered him admission but he declined and they sent him home with antibiotics with Levaquin and they give him a shot of Neupogen.  Patient started feeling better yesterday but then this morning when he woke up he felt terrible and drove directly from the beach to [facility] ED.  Patient sees Dr. [name] as his hematologist  oncologist and hematology oncology were contacted and Dr. [name] recommended placing the patient on IV cefepime as well as giving him a dose of Granix tonight.  Patient continues to feel better but denies any nausea or real vomiting.  States that he has some slight diarrhea but thinks it is from not from eating properly.  His appetite been extremely poor he is not eating very much the last few days.  He denies any burning or discomfort in his urine, chest pain or shortness of breath.  Patient states that he just does not feel good and feels extremely weak.  Upon arrival to the ED patient was afebrile but patient states that he has been subjectively febrile and not been able to take his temperature given that the rental vacation property he was staying and did not have a thermometer.  Patient has been taking acetaminophen though as an outpatient.  Further work-up in the ED showed that labs that he was pancytopenic and that his neutrophil count was 500.  Patient was also noted to have abnormal LFTs, and a elevated creatinine up from a week ago where it was 1.43.  Patient is being admitted for neutropenic fever and pancytopenia with acute on chronic kidney disease and abnormal LFTs.   Oncology has been consulted and working the patient up further and recommending a CT bone marrow biopsy as well as aspiration as well as obtaining a CT of the chest abdomen and pelvis for further evaluation and recommending stopping the Granix and now  IV cefepime has stopped as well.  Is having quite a  bit of diarrhea that is significant will check him for C. difficile as well as a GI pathogen panel given unclear source of infection. C Diff Negative and now GI Pathogen Panel is also Negative.   His labs are improving slowly and plan is for biopsy still.  We will need to discuss with oncology about possible discharge with outpatient biopsy if he continues to improve and given that unpredictability of when he will be placed on  schedule for his bone marrow biopsy and given his medical stability he can be discharged home today I spoke with Dr. Earleen Newport of IR and Dr. [name] and they will arrange outpatient biopsy for the patient.  Patient is improved and feels well and is stable for discharge at this time.   Assessment and Plan:  Neutropenic fever, slowly improving Pancytopenia, improving -Patient presents with subjective fevers and is significantly pancytopenic now after his chemotherapy; patient was actually febrile overnight and had Tmax of 102.3 the day before yesterday and his ANC is now 500 -Patient has a history of IgG kappa myeloma and is currently on chemotherapy and received it last week with Velcade and Revlimid; he is status post induction chemotherapy with RVD followed by autologous stem cell transplant at [facility] in February 2017 -Presents with subjective fevers, and WBC count of 0.9 and a neutrophil count of 500 at the time of admission; now WBC is 1.8 and ANC is 500 -IVF now stopped  -Continue with antipyretics -Blood cultures x2 showed no growth to date in 5 days -Patient continues to have a pancytopenia and WBC went from 0.9 is now 1.1 -> 1.8 -> 2.8 and was 4.4 and hemoglobin/hematocrit has gone from 12.8/35.6 -> 11.0/31.1 -> 10.4/29.3 -> 10.6/29.8 and 1-10.8/30.5 with an MCV of 101.0 and platelet count has dropped from 27 -> 21 -> 27 -> 36 and is now 51 -Urinalysis not really remarkable for infection and chest x-ray showed no acute cardiopulmonary disease; Urine Cx showing no Growth  -Unclear etiology of where his fever and source is but he did state that he had a little bit of diarrhea.  Patient admits to having very poor p.o. intake last few days.  Diarrhea is worsening so we will check this for infectious diarrhea and obtain a GI pathogen panel and C. Difficile; patient states that he has been on antibiotics in the last few months and received a course of antibiotics for sinusitis in [place] late June  and received some Levaquin recently when he was in the ED in [place] oncology has stopped his aspirin given his thrombocytopenia and also his subcu heparin -LDH is now 308 -Empirically started IV cefepime and now Oncology has stopped this (Received 2 doses) -Lactic acid level went from 1.6 and 1.0 -Patient received Granix initially  but now hematology oncology recommending holding Neupogen given that there is a potential for underlying leukemia and they do not want the Neupogen to stimulate this -Medical oncology recommending getting CT of the chest abdomen pelvis as well as a CT bone marrow biopsy and aspiration; CT bone marrow biopsy and aspiration is scheduled for Tuesday 11/17/21 -CT Chest/Abd/Pelvis done and showed "No evidence of active myeloma on CT chest abdomen pelvis. Lucent lesions in the sternum unchanged from comparison PET-CT scan 2016. No plasmacytoma. Stable mild splenomegaly." -Further care per medical oncology and given his medical stability will be discharged home and have his bone marrow biopsy done in outpatient setting.     IgG Kappa Myeloma -Hold his  chemotherapy and hematology oncology has been notified -Patient sees Dr. [name] in the outpatient setting and recently had chemotherapy last week -Patient had a protein ELP done and total protein ERP was 5.4 and IgM was 43 -Appreciate Dr. [name] evaluating today and plan is as above getting a CT of the chest abdomen pelvis as well as a CT bone marrow biopsy and aspiration but this is likely to be done on 11/17/21 however patient is medically stable to be discharged and will have this done in outpatient setting and arranged by Dr. [name] and the interventional radiology team; I spoke with Dr. Earleen Newport and Dr. Earleen Newport says there is no inpatient spot for him yet and they do not know when they will get to him but given his medical stability this will be done in outpatient setting   AKI on CKD stage  IIIa Metabolic Acidosis  -Patient's BUNs/creatinine went from 11/1.43 -> 15/1.88 -> 16/1.57 -> 8/1.36 -> 12/1.10 and is 12/1.06 at the time of discharge -Given IV fluid hydration with 2 L boluses and started on maintenance IV fluid hydration with normal saline at 100 MLS per hour which is now stopped -Patient has a mild Acidosis with a CO2 of 19, AG of 7, and Chloride Level of 112 and this is now improving and his CO2 is now 22, anion gap is 6, chloride level is 110 -We will need to avoid nephrotoxic medications, contrast dyes, hypotension and dehydration to ensure adequate renal perfusion and will need to renally adjust medications -Repeat CMP within 1 week   Hypokalemia -Patient's potassium is now 3.4 yesterday and now is 4.1 with -Continue to Monitor and Replete as Necessary -Repeat CMP within 1 week   Abnormal LFTs -AST and ALT have worsened but he does have a history of abnormal LFTs -AST went from 66 -> 100 -> 88 -> 75 -> 74 -> 74 -ALT went from 58 -> 145 -> 122 -> 119 -> 126 -> 134 -Continue to Monitor and Trend and if Necessary will obtain a RUQ U/S and an Acute Hepatitis Panle    Hypophosphatemia -Patient's Phos level is now 3.3 on last check -Continue monitor and replete as necessary -Repeat Phos level in a.m.   Hypoalbuminemia -Patient's albumin level went from 3.8 is now 3.3 -> 3.0 -> 3.2 x2 -Continue to monitor and trend and repeat CMP in a.m.   Hyperglycemia -Patient's blood sugar on admission was 108 and repeat this morning was 94 -Check hemoglobin A1c in outpatient setting -Continue to monitor blood sugars carefully and if necessary will need to place on sensitive NovoLog/scale insulin   Hyponatremia -Likely in the setting of his dehydration -Patient's sodium is now gone from 133 -> 134 -> 138 x3 -Started IV fluid hydration as above We will continue to monitor and trend and repeat CMP in a.m.   Hx of Tick Bite -RMSF IgG is Negative and [place] Spotted Fever,  IgM 0.20 -Follow-up in the outpatient setting  Nutrition Documentation    Flowsheet Row ED to Hosp-Admission (Current) from 11/12/2021 in [facility]  Nutrition Problem Increased nutrient needs  Etiology cancer and cancer related treatments  Nutrition Goal Patient will meet greater than or equal to 90% of their needs  Interventions Boost [place]reeze, MVI      Consultants: Medical Oncology and IR Procedures performed: None  Disposition: Home Diet recommendation:  Discharge Diet Orders (From admission, onward)     Start     Ordered   11/16/21 0000  Diet - low sodium heart healthy        11/16/21 0942           Regular diet DISCHARGE MEDICATION: Allergies as of 11/16/2021   No Known Allergies      Medication List     STOP taking these medications    lenalidomide 10 MG capsule Commonly known as: REVLIMID   levofloxacin 750 MG tablet Commonly known as: LEVAQUIN   LORazepam 1 MG tablet Commonly known as: ATIVAN   Ninlaro 4 MG capsule Generic drug: ixazomib citrate       TAKE these medications    aspirin 325 MG tablet Take 325 mg by mouth daily.   azithromycin 500 MG tablet Commonly known as: ZITHROMAX Take 500 mg by mouth daily as needed (For pre dental appointments).   bortezomib IV 3.5 MG injection Commonly known as: VELCADE Inject 1 mg/m2 into the vein every 14 (fourteen) days.   cetirizine 10 MG tablet Commonly known as: ZYRTEC Take 10 mg by mouth daily.   multivitamin with minerals Tabs tablet Take 1 tablet by mouth daily. Start taking on: November 17, 2021   ondansetron 4 MG tablet Commonly known as: ZOFRAN Take 1 tablet (4 mg total) by mouth every 6 (six) hours as needed for nausea.   valACYclovir 500 MG tablet Commonly known as: VALTREX Take 500 mg by mouth 2 (two) times daily.   zolpidem 10 MG tablet Commonly known as: AMBIEN Take 1 tablet (10 mg total) by mouth at bedtime as needed for sleep.       Discharge  Exam: Filed Weights   11/12/21 1255 11/13/21 0636 11/14/21 0440  Weight: 97.1 kg 91.6 kg 93.5 kg   Vitals:   11/15/21 2027 11/16/21 0439  BP: 121/84 108/70  Pulse: 74 (!) 52  Resp: 14 16  Temp: 98.4 F (36.9 C) 97.8 F (36.6 C)  SpO2: 98% 98%   Examination: Physical Exam:  Constitutional: WN/WD overweight Caucasian male in no acute distress Respiratory: Diminished to auscultation bilaterally, no wheezing, rales, rhonchi or crackles. Normal respiratory effort and patient is not tachypenic. No accessory muscle use.  Unlabored breathing Cardiovascular: RRR, no murmurs / rubs / gallops. S1 and S2 auscultated. No extremity edema.  Abdomen: Soft, non-tender, distended second by habitus. Bowel sounds positive.  GU: Deferred. Musculoskeletal: No clubbing / cyanosis of digits/nails. No joint deformity upper and lower extremities.  Skin: No rashes, lesions, ulcers on limited skin evaluation. No induration; Warm and dry.  Neurologic: CN 2-12 grossly intact with no focal deficits.  Romberg sign and cerebellar reflexes not assessed.  Psychiatric: Normal judgment and insight. Alert and oriented x 3. Normal mood and appropriate affect.    Condition at discharge: stable  The results of significant diagnostics from this hospitalization (including imaging, microbiology, ancillary and laboratory) are listed below for reference.   Imaging Studies: CT CHEST ABDOMEN PELVIS W CONTRAST  Result Date: 11/13/2021 CLINICAL DATA:  Hematologic malignancy. Pancytopenia. Myeloma. * Tracking Code: BO * EXAM: CT CHEST, ABDOMEN, AND PELVIS WITH CONTRAST TECHNIQUE: Multidetector CT imaging of the chest, abdomen and pelvis was performed following the standard protocol during bolus administration of intravenous contrast. RADIATION DOSE REDUCTION: This exam was performed according to the departmental dose-optimization program which includes automated exposure control, adjustment of the mA and/or kV according to patient  size and/or use of iterative reconstruction technique. CONTRAST:  77mL OMNIPAQUE IOHEXOL 300 MG/ML  SOLN COMPARISON:  FDG PET scan 07/10/2014 FINDINGS: CT CHEST FINDINGS Cardiovascular: No significant vascular  findings. Normal heart size. No pericardial effusion. Mediastinum/Nodes: No axillary or supraclavicular adenopathy. No mediastinal or hilar adenopathy. No pericardial fluid. Esophagus normal. Lungs/Pleura: No suspicious pulmonary nodules. Normal pleural. Airways normal. Musculoskeletal: Lucent lesion centrally within the manubrium measuring 12 mm not changed from comparison PET-CT scan 2016. A similar lucent 9 mm lesion in the sternum on image 67 unchanged. CT ABDOMEN AND PELVIS FINDINGS Hepatobiliary: No focal hepatic lesion. No biliary ductal dilatation. Gallbladder is normal. Common bile duct is normal. Pancreas: Pancreas is normal. No ductal dilatation. No pancreatic inflammation. Spleen: Spleen is mildly enlarged measuring 13.9 x 6.2 x 12.6 cm (volume = 570 cm^3). Calculated volume in 2016 equal 687.) Adrenals/urinary tract: Adrenal glands and kidneys are normal. The ureters and bladder normal. Stomach/Bowel: Stomach, small bowel, appendix, and cecum are normal. The colon and rectosigmoid colon are normal. Vascular/Lymphatic: Abdominal aorta is normal caliber. There is no retroperitoneal or periportal lymphadenopathy. No pelvic lymphadenopathy. Reproductive: Prostate unremarkable Other: No free fluid. Musculoskeletal: No aggressive osseous lesion. IMPRESSION: 1. No evidence of active myeloma on CT chest abdomen pelvis. 2. Lucent lesions in the sternum unchanged from comparison PET-CT scan 2016. 3. No plasmacytoma. 4. Stable mild splenomegaly. Electronically Signed   By: Genevive Bi M.D.   On: 11/13/2021 11:25   DG Chest 2 View  Result Date: 11/12/2021 CLINICAL DATA:  Questionable sepsis - evaluate for abnormality EXAM: CHEST - 2 VIEW COMPARISON:  04/10/2020 FINDINGS: The heart size and  mediastinal contours are within normal limits. No focal airspace consolidation, pleural effusion, or pneumothorax. The visualized skeletal structures are unremarkable. IMPRESSION: No active cardiopulmonary disease. Electronically Signed   By: Duanne Guess D.O.   On: 11/12/2021 14:12    Microbiology: Results for orders placed or performed during the hospital encounter of 11/12/21  SARS Coronavirus 2 by RT PCR (hospital order, performed in [facility] hospital lab) *cepheid single result test* Anterior Nasal Swab     Status: None   Collection Time: 11/12/21  1:22 PM   Specimen: Anterior Nasal Swab  Result Value Ref Range Status   SARS Coronavirus 2 by RT PCR NEGATIVE NEGATIVE Final    Comment: (NOTE) SARS-CoV-2 target nucleic acids are NOT DETECTED.  The SARS-CoV-2 RNA is generally detectable in upper and lower respiratory specimens during the acute phase of infection. The lowest concentration of SARS-CoV-2 viral copies this assay can detect is 250 copies / mL. A negative result does not preclude SARS-CoV-2 infection and should not be used as the sole basis for treatment or other patient management decisions.  A negative result may occur with improper specimen collection / handling, submission of specimen other than nasopharyngeal swab, presence of viral mutation(s) within the areas targeted by this assay, and inadequate number of viral copies (<250 copies / mL). A negative result must be combined with clinical observations, patient history, and epidemiological information.  Fact Sheet for Patients:   RoadLapTop.co.za  Fact Sheet for Healthcare Providers: http://kim-miller.com/  This test is not yet approved or  cleared by the [place] FDA and has been authorized for detection and/or diagnosis of SARS-CoV-2 by FDA under an Emergency Use Authorization (EUA).  This EUA will remain in effect (meaning this test can be used) for the  duration of the COVID-19 declaration under Section 564(b)(1) of the Act, 21 U.S.C. section 360bbb-3(b)(1), unless the authorization is terminated or revoked sooner.  Performed at [facility] [address], [place], [place] [address]   Blood Culture (routine x 2)  Status: None (Preliminary result)   Collection Time: 11/12/21  1:26 PM   Specimen: BLOOD  Result Value Ref Range Status   Specimen Description   Final    BLOOD RIGHT ANTECUBITAL Performed at [facility] [address] [place], [place] [address]    Special Requests   Final    BOTTLES DRAWN AEROBIC AND ANAEROBIC Blood Culture adequate volume Performed at [facility] [address] [place], [place] [address]    Culture   Final    NO GROWTH 3 DAYS Performed at [facility], [place] [address], [place], [place] [address]    Report Status PENDING  Incomplete  Urine Culture     Status: None   Collection Time: 11/12/21  1:31 PM   Specimen: In/Out Cath Urine  Result Value Ref Range Status   Specimen Description   Final    IN/OUT CATH URINE Performed at [facility] [address] [place], [place] [address]    Special Requests   Final    NONE Performed at [facility], [place] [address], [place], [place] [address]    Culture   Final    NO GROWTH Performed at [facility], [place] [address], [place], [place] [address]    Report Status 11/13/2021 FINAL  Final  Blood Culture (routine x 2)     Status: None (Preliminary result)   Collection Time: 11/12/21  1:36 PM   Specimen: BLOOD  Result Value Ref Range Status   Specimen Description   Final    BLOOD LEFT ANTECUBITAL Performed at [facility] [address] [place], [place] [address]    Special Requests   Final    BOTTLES DRAWN AEROBIC AND ANAEROBIC Blood Culture adequate volume Performed at [facility] [address] [place], [place] [address]    Culture   Final    NO GROWTH 3 DAYS Performed at [facility], [place] [address], [place], [place] [address]    Report Status PENDING  Incomplete  C Difficile Quick Screen w PCR reflex     Status: None   Collection Time: 11/13/21 10:59 AM   Specimen: STOOL  Result Value Ref Range Status   C Diff antigen NEGATIVE NEGATIVE Final   C Diff toxin NEGATIVE NEGATIVE Final   C Diff interpretation No C. difficile detected.  Final    Comment: Performed at [facility], [place] [address] [place], [place] [address]  Gastrointestinal Panel by PCR , Stool     Status: None   Collection Time: 11/13/21 10:59 AM   Specimen: STOOL  Result Value Ref Range Status   Campylobacter species NOT DETECTED NOT DETECTED Final   Plesimonas shigelloides NOT DETECTED NOT DETECTED Final   Salmonella species NOT DETECTED NOT DETECTED Final   Yersinia enterocolitica NOT DETECTED NOT DETECTED Final   Vibrio species NOT DETECTED NOT DETECTED Final   Vibrio cholerae NOT DETECTED NOT DETECTED Final   Enteroaggregative E coli (EAEC) NOT DETECTED NOT DETECTED Final   Enteropathogenic E coli (EPEC) NOT DETECTED NOT DETECTED Final   Enterotoxigenic E coli (ETEC) NOT DETECTED NOT DETECTED Final   Shiga like toxin producing E coli (STEC) NOT DETECTED NOT DETECTED Final   Shigella/Enteroinvasive E coli (EIEC) NOT DETECTED NOT DETECTED Final   Cryptosporidium NOT DETECTED NOT DETECTED Final   Cyclospora cayetanensis NOT DETECTED NOT DETECTED Final   Entamoeba histolytica NOT DETECTED NOT DETECTED Final  Giardia lamblia NOT DETECTED NOT DETECTED Final   Adenovirus F40/41 NOT DETECTED NOT DETECTED Final   Astrovirus NOT DETECTED NOT DETECTED Final   Norovirus GI/GII NOT DETECTED NOT DETECTED Final   Rotavirus A NOT DETECTED NOT DETECTED Final   Sapovirus (I, II, IV, and V) NOT DETECTED NOT DETECTED Final    Comment: Performed at [facility], [address]  [place], [place] [address]   *Note: Due to a large number of results and/or encounters for the requested time period, some results have not been displayed. A complete set of results can be found in Results Review.    Labs: CBC: Recent Labs  Lab 11/12/21 1321 11/13/21 0007 11/13/21 0550 11/14/21 0629 11/15/21 0554 11/16/21 0520  WBC 0.9* 0.9* 1.1* 1.8* 2.8* 4.4  NEUTROABS 0.5*  --  0.7* 0.5* 0.6* 0.8*  HGB 12.8* 10.9* 11.0* 10.4* 10.6* 10.8*  HCT 35.6* 30.2* 31.1* 29.3* 29.8* 30.5*  MCV 100.6* 101.7* 102.0* 100.3* 100.3* 101.0*  PLT 27* 23* 21* 27* 36* 51*   Basic Metabolic Panel: Recent Labs  Lab 11/12/21 1321 11/13/21 0007 11/13/21 0550 11/14/21 0629 11/15/21 0554 11/16/21 0520  NA 133*  --  134* 138 138 138  K 3.8  --  3.9 3.7 3.4* 4.1  CL 101  --  107 112* 109 110  CO2 23  --  22 19* 21* 22  GLUCOSE 108*  --  98 100* 94 94  BUN 15  --  _0 CREATININE 1.88* 1.81* 1.57* 1.36* 1.10 1.06  CALCIUM 8.3*  --  7.8* 7.6* 8.0* 8.5*  MG  --   --  1.7 2.2 2.1  --   PHOS  --   --  1.9* 2.4* 3.3  --    Liver Function Tests: Recent Labs  Lab 11/12/21 1321 11/13/21 0550 11/14/21 0629 11/15/21 0554 11/16/21 0520  AST 100* 88* 75* 74* 74*  ALT 145* 122* 119* 126* 134*  ALKPHOS 80 61 55 62 62  BILITOT 1.2 1.0 0.7 0.6 0.5  PROT 7.5 6.2* 5.2* 6.0* 6.1*  ALBUMIN 3.8 3.3* 3.0* 3.2* 3.2*   CBG: Recent Labs  Lab 11/12/21 1256 11/13/21 0723 11/14/21 0748 11/15/21 0726 11/16/21 0737  GLUCAP 94 100* 99 95 111*    Discharge time spent: greater than 30 minutes.  Signed: [name], DO [facility] 11/16/2021

## 2021-11-16 NOTE — Progress Notes (Signed)
[name] is doing better.  His blood counts are coming back.  I really think that he can be discharged and hopefully be able to get the bone marrow biopsy done as an outpatient.  His cultures have been negative.  He is off antibiotics right now.  He is eating well.  There is no diarrhea.  He has had no cough.  His labs show white cell count 4.4.  Hemoglobin 10.8.  Platelet count 51,000.  His LFTs are still little bit elevated.  I does wonder if he has some kind of viral syndrome that may be triggering the pancytopenia.  He did have a CT scan of the body back on 11/13/2021.  This was relatively unremarkable aside some mild splenomegaly.  I think that he must cut any alcohol use.  His vital signs are all stable.  Temperature 97.8.  Pulse 52.  Blood pressure 108/70.  Overall, there really is no change in his physical exam.  Again, I think he would like to be able to go home.  The bone marrow biopsy we can do as an outpatient.  I just wonder if the Revlimid may not be causing all of this.  He clearly need to be back on antiviral therapy.  Doing the bone marrow biopsy will also give [place] an idea as to where we stand with his myeloma.  Lattie Haw, MD  Psalms 41:3

## 2021-11-17 ENCOUNTER — Encounter (HOSPITAL_COMMUNITY): Payer: Self-pay

## 2021-11-17 LAB — MULTIPLE MYELOMA PANEL, SERUM
Albumin SerPl Elph-Mcnc: 3 g/dL (ref 2.9–4.4)
Albumin/Glob SerPl: 1.3 (ref 0.7–1.7)
Alpha 1: 0.3 g/dL (ref 0.0–0.4)
Alpha2 Glob SerPl Elph-Mcnc: 0.4 g/dL (ref 0.4–1.0)
B-Globulin SerPl Elph-Mcnc: 0.7 g/dL (ref 0.7–1.3)
Gamma Glob SerPl Elph-Mcnc: 1 g/dL (ref 0.4–1.8)
Globulin, Total: 2.4 g/dL (ref 2.2–3.9)
IgA: 162 mg/dL (ref 90–386)
IgG (Immunoglobin G), Serum: 1106 mg/dL (ref 603–1613)
IgM (Immunoglobulin M), Srm: 13 mg/dL — ABNORMAL LOW (ref 20–172)
Total Protein ELP: 5.4 g/dL — ABNORMAL LOW (ref 6.0–8.5)

## 2021-11-17 LAB — CULTURE, BLOOD (ROUTINE X 2)
Culture: NO GROWTH
Culture: NO GROWTH
Special Requests: ADEQUATE
Special Requests: ADEQUATE

## 2021-11-18 ENCOUNTER — Ambulatory Visit (HOSPITAL_COMMUNITY)
Admission: RE | Admit: 2021-11-18 | Discharge: 2021-11-18 | Disposition: A | Discharge status: Home / Self Care | Payer: Medicare Other | Source: Ambulatory Visit | Attending: Hematology & Oncology | Admitting: Hematology & Oncology

## 2021-11-18 ENCOUNTER — Other Ambulatory Visit: Payer: Self-pay

## 2021-11-18 ENCOUNTER — Other Ambulatory Visit: Payer: Self-pay | Admitting: Hematology & Oncology

## 2021-11-18 DIAGNOSIS — Z31448 Encounter for other genetic testing of male for procreative management: Secondary | ICD-10-CM | POA: Insufficient documentation

## 2021-11-18 DIAGNOSIS — D61818 Other pancytopenia: Secondary | ICD-10-CM | POA: Insufficient documentation

## 2021-11-18 DIAGNOSIS — D696 Thrombocytopenia, unspecified: Secondary | ICD-10-CM | POA: Diagnosis not present

## 2021-11-18 DIAGNOSIS — C9001 Multiple myeloma in remission: Secondary | ICD-10-CM | POA: Diagnosis not present

## 2021-11-18 DIAGNOSIS — C9002 Multiple myeloma in relapse: Secondary | ICD-10-CM

## 2021-11-18 LAB — CBC WITH DIFFERENTIAL/PLATELET
Abs Immature Granulocytes: 0 10*3/uL (ref 0.00–0.07)
Basophils Absolute: 0 10*3/uL (ref 0.0–0.1)
Basophils Relative: 1 %
Eosinophils Absolute: 0 10*3/uL (ref 0.0–0.5)
Eosinophils Relative: 1 %
HCT: 28.8 % — ABNORMAL LOW (ref 39.0–52.0)
Hemoglobin: 10.3 g/dL — ABNORMAL LOW (ref 13.0–17.0)
Lymphocytes Relative: 59 %
Lymphs Abs: 2.2 10*3/uL (ref 0.7–4.0)
MCH: 36.1 pg — ABNORMAL HIGH (ref 26.0–34.0)
MCHC: 35.8 g/dL (ref 30.0–36.0)
MCV: 101.1 fL — ABNORMAL HIGH (ref 80.0–100.0)
Monocytes Absolute: 0.3 10*3/uL (ref 0.1–1.0)
Monocytes Relative: 7 %
Neutro Abs: 1.2 10*3/uL — ABNORMAL LOW (ref 1.7–7.7)
Neutrophils Relative %: 32 %
Platelets: 86 10*3/uL — ABNORMAL LOW (ref 150–400)
RBC: 2.85 MIL/uL — ABNORMAL LOW (ref 4.22–5.81)
RDW: 13 % (ref 11.5–15.5)
WBC: 3.7 10*3/uL — ABNORMAL LOW (ref 4.0–10.5)
nRBC: 0 % (ref 0.0–0.2)
nRBC: 0 /100 WBC

## 2021-11-18 MED ORDER — MIDAZOLAM HCL 2 MG/2ML IJ SOLN
INTRAMUSCULAR | Status: AC | PRN
  Administered 2021-11-18: 1 mg via INTRAVENOUS

## 2021-11-18 MED ORDER — FENTANYL CITRATE (PF) 100 MCG/2ML IJ SOLN
INTRAMUSCULAR | Status: AC | PRN
  Administered 2021-11-18 (×2): 25 ug via INTRAVENOUS

## 2021-11-18 MED ORDER — FENTANYL CITRATE (PF) 100 MCG/2ML IJ SOLN
INTRAMUSCULAR | Status: AC
  Filled 2021-11-18: qty 2

## 2021-11-18 MED ORDER — MIDAZOLAM HCL 2 MG/2ML IJ SOLN
INTRAMUSCULAR | Status: AC
  Filled 2021-11-18: qty 2

## 2021-11-18 MED ORDER — LIDOCAINE HCL 1 % IJ SOLN
INTRAMUSCULAR | Status: AC
  Filled 2021-11-18: qty 10

## 2021-11-18 NOTE — H&P (Addendum)
Chief Complaint: Patient was seen in consultation today for pancytopenia at the request of Ennever,Peter R  Referring Physician(s): Ennever,Peter R  Supervising Physician: Ruthann Cancer  Patient Status: [facility] - Out-pt  History of Present Illness: Tyler Phillips. is a 56 y.o. male just discharged from hospital on 7/31 past medical history of IgG kappa multiple myeloma with prior stem cell transplant in 2017 currently on maintenance therapy with Velcade and Revlimid.  He was most recently admitted with fatigue, weakness, low-grade temperature, persistent and slightly worsening leukopenia/ thrombocytopenia/anemia, diarrhea, persistent renal insufficiency/elevated LFTs.   Overall, he is feeling better than when he is in the hospital and has no current complaints.  He presents today for scheduled bone marrow biopsy.     Past Medical History:  Diagnosis Date   Anemia    Cancer ([place])    Elevated LFTs    Gallbladder polyp    Multiple myeloma ([place])    Pneumonia     Past Surgical History:  Procedure Laterality Date   auto stem cell transplant      Allergies: Patient has no known allergies.  Medications: Prior to Admission medications   Medication Sig Start Date End Date Taking? Authorizing Provider  aspirin 325 MG tablet Take 325 mg by mouth daily.   Yes [provider]  azithromycin (ZITHROMAX) 500 MG tablet Take 500 mg by mouth daily as needed (For pre dental appointments).   Yes [provider]  bortezomib IV (VELCADE) 3.5 MG injection Inject 1 mg/m2 into the vein every 14 (fourteen) days.   Yes [provider]  cetirizine (ZYRTEC) 10 MG tablet Take 10 mg by mouth daily.   Yes [provider]  Multiple Vitamin (MULTIVITAMIN WITH MINERALS) TABS tablet Take 1 tablet by mouth daily. 11/17/21  Yes Sheikh, Omair Latif, DO  valACYclovir (VALTREX) 500 MG tablet Take 500 mg by mouth 2 (two) times daily.    Yes [provider]  ondansetron  (ZOFRAN) 4 MG tablet Take 1 tablet (4 mg total) by mouth every 6 (six) hours as needed for nausea. 11/16/21   Sheikh, Georgina Quint Latif, DO  zolpidem (AMBIEN) 10 MG tablet Take 1 tablet (10 mg total) by mouth at bedtime as needed for sleep. 01/15/19 08/28/21  [name], MD     Family History  Problem Relation Age of Onset   Prostate cancer Mother    COPD Father    Heart disease Brother    Leukemia Maternal Grandmother    Stomach cancer Neg Hx    Colon cancer Neg Hx    Esophageal cancer Neg Hx     Social History   Socioeconomic History   Marital status: Married    Spouse name: Not on file   Number of children: 3   Years of education: Not on file   Highest education level: Not on file  Occupational History   Occupation: retired Chief Executive Officer  Tobacco Use   Smoking status: Never   Smokeless tobacco: Never   Tobacco comments:    NEVER USED TOBACCO  Vaping Use   Vaping Use: Never used  Substance and Sexual Activity   Alcohol use: Yes    Alcohol/week: 7.0 standard drinks of alcohol    Types: 7 Glasses of wine per week    Comment: beer wine and liqour 2 drinks a night   Drug use: No   Sexual activity: Not on file  Other Topics Concern   Not on file  Social History Narrative   Not on  file   Social Determinants of Health   Financial Resource Strain: Not on file  Food Insecurity: Not on file  Transportation Needs: Not on file  Physical Activity: Not on file  Stress: Not on file  Social Connections: Not on file    Review of Systems  Constitutional: Negative.   HENT: Negative.    Eyes: Negative.   Respiratory: Negative.    Cardiovascular: Negative.   Gastrointestinal: Negative.   Endocrine: Negative.   Genitourinary: Negative.   Musculoskeletal: Negative.   Allergic/Immunologic: Negative.   Neurological: Negative.   Hematological: Negative.   Psychiatric/Behavioral: Negative.      Vital Signs: BP 109/84   Pulse 68   Temp 97.7 F (36.5 C) (Oral)   Resp 17   Ht 6'  1" (1.854 m)   Wt 214 lb (97.1 kg)   SpO2 100%   BMI 28.23 kg/m     Physical Exam Vitals reviewed.  Constitutional:      Appearance: Normal appearance.  HENT:     Head: Normocephalic and atraumatic.     Mouth/Throat:     Mouth: Mucous membranes are moist.     Pharynx: Oropharynx is clear.  Eyes:     Extraocular Movements: Extraocular movements intact.     Conjunctiva/sclera: Conjunctivae normal.  Cardiovascular:     Rate and Rhythm: Normal rate and regular rhythm.     Pulses: Normal pulses.     Heart sounds: Normal heart sounds.  Pulmonary:     Effort: Pulmonary effort is normal.     Breath sounds: Normal breath sounds.  Abdominal:     General: Abdomen is flat.     Palpations: Abdomen is soft.  Skin:    General: Skin is warm and dry.     Capillary Refill: Capillary refill takes less than 2 seconds.  Neurological:     General: No focal deficit present.     Mental Status: He is alert and oriented to person, place, and time.  Psychiatric:        Mood and Affect: Mood normal.        Behavior: Behavior normal.     Imaging: CT CHEST ABDOMEN PELVIS W CONTRAST  Result Date: 11/13/2021 CLINICAL DATA:  Hematologic malignancy. Pancytopenia. Myeloma. * Tracking Code: BO * EXAM: CT CHEST, ABDOMEN, AND PELVIS WITH CONTRAST TECHNIQUE: Multidetector CT imaging of the chest, abdomen and pelvis was performed following the standard protocol during bolus administration of intravenous contrast. RADIATION DOSE REDUCTION: This exam was performed according to the departmental dose-optimization program which includes automated exposure control, adjustment of the mA and/or kV according to patient size and/or use of iterative reconstruction technique. CONTRAST:  64m OMNIPAQUE IOHEXOL 300 MG/ML  SOLN COMPARISON:  FDG PET scan 07/10/2014 FINDINGS: CT CHEST FINDINGS Cardiovascular: No significant vascular findings. Normal heart size. No pericardial effusion. Mediastinum/Nodes: No axillary or  supraclavicular adenopathy. No mediastinal or hilar adenopathy. No pericardial fluid. Esophagus normal. Lungs/Pleura: No suspicious pulmonary nodules. Normal pleural. Airways normal. Musculoskeletal: Lucent lesion centrally within the manubrium measuring 12 mm not changed from comparison PET-CT scan 2016. A similar lucent 9 mm lesion in the sternum on image 67 unchanged. CT ABDOMEN AND PELVIS FINDINGS Hepatobiliary: No focal hepatic lesion. No biliary ductal dilatation. Gallbladder is normal. Common bile duct is normal. Pancreas: Pancreas is normal. No ductal dilatation. No pancreatic inflammation. Spleen: Spleen is mildly enlarged measuring 13.9 x 6.2 x 12.6 cm (volume = 570 cm^3). Calculated volume in 2016 equal 687.) Adrenals/urinary tract: Adrenal glands and kidneys  are normal. The ureters and bladder normal. Stomach/Bowel: Stomach, small bowel, appendix, and cecum are normal. The colon and rectosigmoid colon are normal. Vascular/Lymphatic: Abdominal aorta is normal caliber. There is no retroperitoneal or periportal lymphadenopathy. No pelvic lymphadenopathy. Reproductive: Prostate unremarkable Other: No free fluid. Musculoskeletal: No aggressive osseous lesion. IMPRESSION: 1. No evidence of active myeloma on CT chest abdomen pelvis. 2. Lucent lesions in the sternum unchanged from comparison PET-CT scan 2016. 3. No plasmacytoma. 4. Stable mild splenomegaly. Electronically Signed   By: Suzy Bouchard M.D.   On: 11/13/2021 11:25   DG Chest 2 View  Result Date: 11/12/2021 CLINICAL DATA:  Questionable sepsis - evaluate for abnormality EXAM: CHEST - 2 VIEW COMPARISON:  04/10/2020 FINDINGS: The heart size and mediastinal contours are within normal limits. No focal airspace consolidation, pleural effusion, or pneumothorax. The visualized skeletal structures are unremarkable. IMPRESSION: No active cardiopulmonary disease. Electronically Signed   By: Davina Poke D.O.   On: 11/12/2021 14:12     Labs:  CBC: Recent Labs    11/13/21 0550 11/14/21 0629 11/15/21 0554 11/16/21 0520  WBC 1.1* 1.8* 2.8* 4.4  HGB 11.0* 10.4* 10.6* 10.8*  HCT 31.1* 29.3* 29.8* 30.5*  PLT 21* 27* 36* 51*    COAGS: Recent Labs    11/12/21 1321  INR 1.1  APTT 28    BMP: Recent Labs    11/13/21 0550 11/14/21 0629 11/15/21 0554 11/16/21 0520  NA 134* 138 138 138  K 3.9 3.7 3.4* 4.1  CL 107 112* 109 110  CO2 22 19* 21* 22  GLUCOSE 98 100* 94 94  BUN '16 8 12 12  ' CALCIUM 7.8* 7.6* 8.0* 8.5*  CREATININE 1.57* 1.36* 1.10 1.06  GFRNONAA 51* >60 >60 >60    LIVER FUNCTION TESTS: Recent Labs    11/13/21 0550 11/14/21 0629 11/15/21 0554 11/16/21 0520  BILITOT 1.0 0.7 0.6 0.5  AST 88* 75* 74* 74*  ALT 122* 119* 126* 134*  ALKPHOS 61 55 62 62  PROT 6.2* 5.2* 6.0* 6.1*  ALBUMIN 3.3* 3.0* 3.2* 3.2*    Assessment and Plan:  Thrombocytopenia --MM on maintenance therapy --For bone marrow biopsy today  Risks and benefits of bone marrow biopsy was discussed with the patient and/or patient's family including, but not limited to bleeding, infection, damage to adjacent structures or low yield requiring additional tests.  All of the questions were answered and there is agreement to proceed.  Consent signed and in chart.   Thank you for this interesting consult.  I greatly enjoyed meeting Dorsel Flinn. and look forward to participating in their care.  A copy of this report was sent to the requesting provider on this date.  Electronically Signed: Pasty Spillers, PA 11/18/2021, 8:26 AM   I spent a total of 15 Minutes in face to face in clinical consultation, greater than 50% of which was counseling/coordinating care for bone marrow biopsy.

## 2021-11-18 NOTE — Sedation Documentation (Signed)
This RN attempted to call report to Short Stay. Short Stay nurse unavailable to take report at this time.

## 2021-11-18 NOTE — Procedures (Signed)
Interventional Radiology Procedure Note  Procedure: CT guided aspirate and core biopsy of right iliac bone  Complications: None  Recommendations: - Bedrest supine x 1 hrs - Hydrocodone PRN  Pain - Follow biopsy results   Yamil Oelke, MD   

## 2021-11-18 NOTE — Progress Notes (Signed)
Patient was given discharge instructions. He verbalized understanding. 

## 2021-11-19 ENCOUNTER — Inpatient Hospital Stay: Payer: Medicare Other

## 2021-11-24 ENCOUNTER — Telehealth: Payer: Self-pay | Admitting: *Deleted

## 2021-11-24 NOTE — Telephone Encounter (Signed)
Message received from patient wanting to know when he should see Dr [name] next.  Dr. [name] notified and would like to see pt next week. Message sent to scheduling and call placed to patient and message left to notify him of appt time and day. Instructed pt to call office back with any questions.

## 2021-11-27 ENCOUNTER — Encounter (HOSPITAL_COMMUNITY): Payer: Self-pay | Admitting: Hematology & Oncology

## 2021-11-27 DIAGNOSIS — R7989 Other specified abnormal findings of blood chemistry: Secondary | ICD-10-CM | POA: Diagnosis not present

## 2021-11-27 DIAGNOSIS — C9000 Multiple myeloma not having achieved remission: Secondary | ICD-10-CM | POA: Diagnosis not present

## 2021-11-27 DIAGNOSIS — D709 Neutropenia, unspecified: Secondary | ICD-10-CM | POA: Diagnosis not present

## 2021-11-30 ENCOUNTER — Encounter (HOSPITAL_COMMUNITY): Payer: Self-pay | Admitting: Hematology & Oncology

## 2021-12-01 ENCOUNTER — Ambulatory Visit: Payer: Medicare Other | Admitting: Hematology & Oncology

## 2021-12-01 ENCOUNTER — Inpatient Hospital Stay: Payer: Medicare Other

## 2021-12-03 ENCOUNTER — Ambulatory Visit: Payer: Medicare Other

## 2021-12-03 ENCOUNTER — Other Ambulatory Visit: Payer: Medicare Other

## 2021-12-04 ENCOUNTER — Ambulatory Visit (HOSPITAL_COMMUNITY): Payer: Medicare Other

## 2021-12-07 ENCOUNTER — Inpatient Hospital Stay (HOSPITAL_BASED_OUTPATIENT_CLINIC_OR_DEPARTMENT_OTHER): Payer: Medicare Other | Admitting: Hematology & Oncology

## 2021-12-07 ENCOUNTER — Other Ambulatory Visit: Payer: Self-pay

## 2021-12-07 ENCOUNTER — Inpatient Hospital Stay: Payer: Medicare Other | Attending: Hematology & Oncology

## 2021-12-07 ENCOUNTER — Encounter: Payer: Self-pay | Admitting: Hematology & Oncology

## 2021-12-07 VITALS — BP 125/81 | HR 68 | Temp 97.8°F | Resp 18 | Wt 205.0 lb

## 2021-12-07 DIAGNOSIS — C9000 Multiple myeloma not having achieved remission: Secondary | ICD-10-CM | POA: Diagnosis not present

## 2021-12-07 DIAGNOSIS — C9001 Multiple myeloma in remission: Secondary | ICD-10-CM

## 2021-12-07 LAB — CBC WITH DIFFERENTIAL (CANCER CENTER ONLY)
Abs Immature Granulocytes: 0.01 10*3/uL (ref 0.00–0.07)
Basophils Absolute: 0 10*3/uL (ref 0.0–0.1)
Basophils Relative: 0 %
Eosinophils Absolute: 0 10*3/uL (ref 0.0–0.5)
Eosinophils Relative: 1 %
HCT: 35.2 % — ABNORMAL LOW (ref 39.0–52.0)
Hemoglobin: 12.3 g/dL — ABNORMAL LOW (ref 13.0–17.0)
Immature Granulocytes: 0 %
Lymphocytes Relative: 41 %
Lymphs Abs: 2.1 10*3/uL (ref 0.7–4.0)
MCH: 35.9 pg — ABNORMAL HIGH (ref 26.0–34.0)
MCHC: 34.9 g/dL (ref 30.0–36.0)
MCV: 102.6 fL — ABNORMAL HIGH (ref 80.0–100.0)
Monocytes Absolute: 0.4 10*3/uL (ref 0.1–1.0)
Monocytes Relative: 8 %
Neutro Abs: 2.7 10*3/uL (ref 1.7–7.7)
Neutrophils Relative %: 50 %
Platelet Count: 161 10*3/uL (ref 150–400)
RBC: 3.43 MIL/uL — ABNORMAL LOW (ref 4.22–5.81)
RDW: 13 % (ref 11.5–15.5)
WBC Count: 5.2 10*3/uL (ref 4.0–10.5)
nRBC: 0 % (ref 0.0–0.2)

## 2021-12-07 LAB — CMP (CANCER CENTER ONLY)
ALT: 38 U/L (ref 0–44)
AST: 26 U/L (ref 15–41)
Albumin: 3.9 g/dL (ref 3.5–5.0)
Alkaline Phosphatase: 65 U/L (ref 38–126)
Anion gap: 7 (ref 5–15)
BUN: 21 mg/dL — ABNORMAL HIGH (ref 6–20)
CO2: 24 mmol/L (ref 22–32)
Calcium: 9.1 mg/dL (ref 8.9–10.3)
Chloride: 105 mmol/L (ref 98–111)
Creatinine: 1.34 mg/dL — ABNORMAL HIGH (ref 0.61–1.24)
GFR, Estimated: >60 mL/min (ref >60)
Glucose, Bld: 90 mg/dL (ref 70–99)
Potassium: 4.6 mmol/L (ref 3.5–5.1)
Sodium: 136 mmol/L (ref 135–145)
Total Bilirubin: 0.7 mg/dL (ref 0.3–1.2)
Total Protein: 7.9 g/dL (ref 6.5–8.1)

## 2021-12-07 LAB — LACTATE DEHYDROGENASE: LDH: 140 U/L (ref 98–192)

## 2021-12-07 NOTE — Progress Notes (Signed)
Hematology and Oncology Follow Up Visit  Tyler Phillips [id number] 01-02-1966 56 y.o. 12/07/2021   Principle Diagnosis:  IgG Kappa myeloma - +4, +14, +17   Past Therapy: Status post autologous stem cell transplant on 05/22/2015   S/p cycle 6 of RVD [place] 4 mg po q 2 week -- start on 01/02/2019 -- d/c on 02/10/2019   Current Therapy:        Velcade q 2wk dosing Revlimid 53m po q day (21/7)  - d/c on 12/07/2021   Interim History:  Mr. DSherlean Footis her today for follow-up.  He really had a tough time recently.  He actually was down at the beach.  He started having fevers.  He went to a local hospital.  He was found to have pancytopenia.  He subsequently came up to [place]  [facility]  When he was admitted, his white cell count was 900.  His hemoglobin was 12.8.  Platelet count 27,000.  He [place] had just gotten [place]  He apparently had a sinus infection follow-up there was put on antibiotics.  He did have a bone marrow biopsy done.  Surprisingly, the bone marrow biopsy actually turned out much better than I would have thought.  This was done on 11/18/2021.  The pathology report ([id number]) showed a slightly hypercellular marrow with some dyspoietic changes.  He had 7% plasma cells but these were not monoclonal.  The cytogenetics were normal.  He had normal FISH panel.  He was given some colony-stimulating factors.  His white cell count came up quite nicely.  All cultures were negative.  X-rays were all unremarkable.  Today, he looks great.  He comes in with his wife.  I am happy that he is back to where he needs to be.  I am still not sure is as to why he had the marked pancytopenia.  I do not know if this was some kind of viral infection.  I know this would be incredibly hard to prove.  I do think the fact that there is some dyspoietic changes is a little bit troublesome.  This could indicate that the  Revlimid might be causing some issues.  Of note, he is probably 6 years out from his stem cell transplant.  Possibly, the preparatory chemotherapy that he got for the transplant could be causing some of these dyspoietic changes.  However, given the fact that there is no cytogenetic abnormalities is certainly encouraging.  I do think that we probably need to drop the Revlimid.  I think is doing well on the Velcade.  I think Velcade by itself right now would not be a bad idea for him.  I do not think that the marrow shows any obvious monoclonal plasmacytosis.  He is eating well.  He is having no problems with bowels or bladder.  He has had no rashes.  He has had no cough or shortness of breath.  Overall, I would say his performance status is probably ECOG 0.   Medications:  Allergies as of 12/07/2021   No Known Allergies      Medication List        Accurate as of December 07, 2021  9:11 AM. If you have any questions, ask your nurse or doctor.          aspirin 325 MG tablet Take 325 mg by mouth daily.   azithromycin 500 MG tablet Commonly known as: ZJQGBEEFEO  Take 500 mg by mouth daily as needed (For pre dental appointments).   bortezomib IV 3.5 MG injection Commonly known as: VELCADE Inject 1 mg/m2 into the vein every 14 (fourteen) days.   cetirizine 10 MG tablet Commonly known as: ZYRTEC Take 10 mg by mouth daily.   multivitamin with minerals Tabs tablet Take 1 tablet by mouth daily.   ondansetron 4 MG tablet Commonly known as: ZOFRAN Take 1 tablet (4 mg total) by mouth every 6 (six) hours as needed for nausea.   valACYclovir 500 MG tablet Commonly known as: VALTREX Take 500 mg by mouth 2 (two) times daily.   zolpidem 10 MG tablet Commonly known as: AMBIEN Take 1 tablet (10 mg total) by mouth at bedtime as needed for sleep.        Allergies: No Known Allergies  Past Medical History, Surgical history, Social history, and Family History were reviewed and  updated.  Review of Systems: Review of Systems  Constitutional: Negative.   HENT: Negative.    Eyes: Negative.   Respiratory: Negative.    Cardiovascular: Negative.   Gastrointestinal: Negative.   Genitourinary: Negative.   Musculoskeletal: Negative.   Skin: Negative.   Neurological: Negative.   Endo/Heme/Allergies: Negative.   Psychiatric/Behavioral: Negative.       Physical Exam:  weight is 205 lb (93 kg). His oral temperature is 97.8 F (36.6 C). His blood pressure is 125/81 and his pulse is 68. His respiration is 18 and oxygen saturation is 100%.   Wt Readings from Last 3 Encounters:  12/07/21 205 lb (93 kg)  11/18/21 214 lb (97.1 kg)  11/14/21 206 lb 2.1 oz (93.5 kg)   Physical Exam Vitals reviewed.  HENT:     Head: Normocephalic and atraumatic.  Eyes:     Pupils: Pupils are equal, round, and reactive to light.  Cardiovascular:     Rate and Rhythm: Normal rate and regular rhythm.     Heart sounds: Normal heart sounds.  Pulmonary:     Effort: Pulmonary effort is normal.     Breath sounds: Normal breath sounds.  Abdominal:     General: Bowel sounds are normal.     Palpations: Abdomen is soft.  Musculoskeletal:        General: No tenderness or deformity. Normal range of motion.     Cervical back: Normal range of motion.  Lymphadenopathy:     Cervical: No cervical adenopathy.  Skin:    General: Skin is warm and dry.     Findings: No erythema or rash.  Neurological:     Mental Status: He is alert and oriented to person, place, and time.  Psychiatric:        Behavior: Behavior normal.        Thought Content: Thought content normal.        Judgment: Judgment normal.      Lab Results  Component Value Date   WBC 5.2 12/07/2021   HGB 12.3 (L) 12/07/2021   HCT 35.2 (L) 12/07/2021   MCV 102.6 (H) 12/07/2021   PLT 161 12/07/2021   Lab Results  Component Value Date   FERRITIN 1,263 (H) 07/20/2014   IRON 125 07/20/2014   TIBC 198 (L) 07/20/2014   UIBC  73 (L) 07/20/2014   IRONPCTSAT 63 (H) 07/20/2014   Lab Results  Component Value Date   RETICCTPCT 0.8 07/20/2014   RBC 3.43 (L) 12/07/2021   Lab Results  Component Value Date   KPAFRELGTCHN 46.8 (H) 11/05/2021   LAMBDASER  28.9 (H) 11/05/2021   KAPLAMBRATIO 1.62 11/05/2021   Lab Results  Component Value Date   IGGSERUM 1,106 11/14/2021   IGA 162 11/14/2021   IGMSERUM 13 (L) 11/14/2021   Lab Results  Component Value Date   TOTALPROTELP 5.4 (L) 11/14/2021   ALBUMINELP 3.9 11/05/2021   A1GS 0.1 11/05/2021   A2GS 0.5 11/05/2021   BETS 0.8 11/05/2021   BETA2SER 0.3 03/28/2015   GAMS 1.3 11/05/2021   MSPIKE Not Observed 11/05/2021   SPEI Comment 11/05/2021     Chemistry      Component Value Date/Time   NA 138 11/16/2021 0520   NA 140 04/22/2017 1140   NA 138 09/03/2015 1042   K 4.1 11/16/2021 0520   K 4.0 04/22/2017 1140   K 4.3 09/03/2015 1042   CL 110 11/16/2021 0520   CL 105 04/22/2017 1140   CO2 22 11/16/2021 0520   CO2 24 04/22/2017 1140   CO2 25 09/03/2015 1042   BUN 12 11/16/2021 0520   BUN 16 04/22/2017 1140   BUN 21.7 09/03/2015 1042   CREATININE 1.06 11/16/2021 0520   CREATININE 1.43 (H) 11/05/2021 1143   CREATININE 1.6 (H) 04/22/2017 1140   CREATININE 1.2 09/03/2015 1042      Component Value Date/Time   CALCIUM 8.5 (L) 11/16/2021 0520   CALCIUM 8.5 04/22/2017 1140   CALCIUM 9.1 09/03/2015 1042   ALKPHOS 62 11/16/2021 0520   ALKPHOS 59 04/22/2017 1140   ALKPHOS 42 09/03/2015 1042   AST 74 (H) 11/16/2021 0520   AST 66 (H) 11/05/2021 1143   AST 27 09/03/2015 1042   ALT 134 (H) 11/16/2021 0520   ALT 58 (H) 11/05/2021 1143   ALT 45 04/22/2017 1140   ALT 41 09/03/2015 1042   BILITOT 0.5 11/16/2021 0520   BILITOT 0.7 11/05/2021 1143   BILITOT 0.54 09/03/2015 1042       Impression and Plan: Tyler Phillips is a very pleasant 56 yo caucasian gentleman with IgG kappa myeloma. He underwent induction chemotherapy with RVD followed by an autologous stem  cell transplant at [facility] on February 2017.   He has been on maintenance Revlimid and Velcade.  At this time, we will drop the Revlimid.  I really think this might be a source of trouble for his bone marrow.  Clearly, he is susceptible to I think infections.  Again even though we do not have a reason for this pancytopenia, have to believe that he is immunocompromise to some degree.  I would just continue him on Velcade for right now.  I think we find that his numbers for myeloma go up, then I would probably put in [place].  I think this would be a reasonable way to go.  I have will send off next generation sequencing for myelodysplasia when we see him back.  I think this would really be a reasonable thing to do to see if there is any genetic changes that we just are not seen on the [facility] panel or the chromosomes.  We will go ahead and get everything started in a week or so.  [name], MD 8/21/20239:11 AM

## 2021-12-08 LAB — KAPPA/LAMBDA LIGHT CHAINS
Kappa free light chain: 35.3 mg/L — ABNORMAL HIGH (ref 3.3–19.4)
Kappa, lambda light chain ratio: 1.26 (ref 0.26–1.65)
Lambda free light chains: 28 mg/L — ABNORMAL HIGH (ref 5.7–26.3)

## 2021-12-08 LAB — IGG, IGA, IGM
IgA: 203 mg/dL (ref 90–386)
IgG (Immunoglobin G), Serum: 1608 mg/dL (ref 603–1613)
IgM (Immunoglobulin M), Srm: 32 mg/dL (ref 20–172)

## 2021-12-09 ENCOUNTER — Other Ambulatory Visit: Payer: Self-pay

## 2021-12-09 LAB — PROTEIN ELECTROPHORESIS, SERUM
A/G Ratio: 1.2 (ref 0.7–1.7)
Albumin ELP: 3.9 g/dL (ref 2.9–4.4)
Alpha-1-Globulin: 0.2 g/dL (ref 0.0–0.4)
Alpha-2-Globulin: 0.8 g/dL (ref 0.4–1.0)
Beta Globulin: 0.9 g/dL (ref 0.7–1.3)
Gamma Globulin: 1.5 g/dL (ref 0.4–1.8)
Globulin, Total: 3.3 g/dL (ref 2.2–3.9)
Total Protein ELP: 7.2 g/dL (ref 6.0–8.5)

## 2021-12-15 DIAGNOSIS — Z0000 Encounter for general adult medical examination without abnormal findings: Secondary | ICD-10-CM | POA: Diagnosis not present

## 2021-12-15 LAB — SURGICAL PATHOLOGY

## 2021-12-17 ENCOUNTER — Other Ambulatory Visit: Payer: Medicare Other

## 2021-12-17 ENCOUNTER — Ambulatory Visit: Payer: Medicare Other

## 2021-12-18 ENCOUNTER — Inpatient Hospital Stay: Payer: Medicare Other

## 2021-12-18 ENCOUNTER — Inpatient Hospital Stay (HOSPITAL_BASED_OUTPATIENT_CLINIC_OR_DEPARTMENT_OTHER): Payer: Medicare Other | Admitting: Hematology & Oncology

## 2021-12-18 ENCOUNTER — Inpatient Hospital Stay: Payer: Medicare Other | Attending: Hematology & Oncology

## 2021-12-18 ENCOUNTER — Other Ambulatory Visit: Payer: Self-pay

## 2021-12-18 ENCOUNTER — Encounter: Payer: Self-pay | Admitting: Hematology & Oncology

## 2021-12-18 DIAGNOSIS — C9001 Multiple myeloma in remission: Secondary | ICD-10-CM

## 2021-12-18 DIAGNOSIS — Z5112 Encounter for antineoplastic immunotherapy: Secondary | ICD-10-CM | POA: Diagnosis not present

## 2021-12-18 DIAGNOSIS — Z79899 Other long term (current) drug therapy: Secondary | ICD-10-CM | POA: Insufficient documentation

## 2021-12-18 DIAGNOSIS — C9000 Multiple myeloma not having achieved remission: Secondary | ICD-10-CM | POA: Diagnosis not present

## 2021-12-18 LAB — CMP (CANCER CENTER ONLY)
ALT: 38 U/L (ref 0–44)
AST: 24 U/L (ref 15–41)
Albumin: 4.2 g/dL (ref 3.5–5.0)
Alkaline Phosphatase: 62 U/L (ref 38–126)
Anion gap: 6 (ref 5–15)
BUN: 18 mg/dL (ref 6–20)
CO2: 25 mmol/L (ref 22–32)
Calcium: 8.9 mg/dL (ref 8.9–10.3)
Chloride: 105 mmol/L (ref 98–111)
Creatinine: 1.41 mg/dL — ABNORMAL HIGH (ref 0.61–1.24)
GFR, Estimated: 58 mL/min — ABNORMAL LOW (ref >60)
Glucose, Bld: 76 mg/dL (ref 70–99)
Potassium: 4.4 mmol/L (ref 3.5–5.1)
Sodium: 136 mmol/L (ref 135–145)
Total Bilirubin: 0.4 mg/dL (ref 0.3–1.2)
Total Protein: 7.5 g/dL (ref 6.5–8.1)

## 2021-12-18 LAB — CBC WITH DIFFERENTIAL (CANCER CENTER ONLY)
Abs Immature Granulocytes: 0.01 10*3/uL (ref 0.00–0.07)
Basophils Absolute: 0 10*3/uL (ref 0.0–0.1)
Basophils Relative: 0 %
Eosinophils Absolute: 0 10*3/uL (ref 0.0–0.5)
Eosinophils Relative: 0 %
HCT: 35.3 % — ABNORMAL LOW (ref 39.0–52.0)
Hemoglobin: 12 g/dL — ABNORMAL LOW (ref 13.0–17.0)
Immature Granulocytes: 0 %
Lymphocytes Relative: 33 %
Lymphs Abs: 1.3 10*3/uL (ref 0.7–4.0)
MCH: 35.5 pg — ABNORMAL HIGH (ref 26.0–34.0)
MCHC: 34 g/dL (ref 30.0–36.0)
MCV: 104.4 fL — ABNORMAL HIGH (ref 80.0–100.0)
Monocytes Absolute: 0.2 10*3/uL (ref 0.1–1.0)
Monocytes Relative: 6 %
Neutro Abs: 2.5 10*3/uL (ref 1.7–7.7)
Neutrophils Relative %: 61 %
Platelet Count: 158 10*3/uL (ref 150–400)
RBC: 3.38 MIL/uL — ABNORMAL LOW (ref 4.22–5.81)
RDW: 13.3 % (ref 11.5–15.5)
WBC Count: 4.1 10*3/uL (ref 4.0–10.5)
nRBC: 0 % (ref 0.0–0.2)

## 2021-12-18 LAB — LACTATE DEHYDROGENASE: LDH: 141 U/L (ref 98–192)

## 2021-12-18 MED ORDER — PROCHLORPERAZINE MALEATE 10 MG PO TABS: 10.0000 mg | ORAL_TABLET | Freq: Once | ORAL | Status: DC

## 2021-12-18 MED ORDER — BORTEZOMIB CHEMO SQ INJECTION 3.5 MG (2.5MG/ML)
1.3000 mg/m2 | Freq: Once | INTRAMUSCULAR | Status: AC
  Administered 2021-12-18: 2.75 mg via SUBCUTANEOUS
  Filled 2021-12-18: qty 1.1

## 2021-12-18 NOTE — Patient Instructions (Signed)
[facility]  Discharge Instructions: Thank you for choosing [facility] to provide your oncology and hematology care.   If you have a lab appointment with the Cancer Center, please go directly to the Cancer Center and check in at the registration area.  Wear comfortable clothing and clothing appropriate for easy access to any Portacath or PICC line.   We strive to give you quality time with your provider. You may need to reschedule your appointment if you arrive late (15 or more minutes).  Arriving late affects you and other patients whose appointments are after yours.  Also, if you miss three or more appointments without notifying the office, you may be dismissed from the clinic at the provider's discretion.      For prescription refill requests, have your pharmacy contact our office and allow 72 hours for refills to be completed.    Today you received the following chemotherapy and/or immunotherapy agents Velcade      To help prevent nausea and vomiting after your treatment, we encourage you to take your nausea medication as directed.  BELOW ARE SYMPTOMS THAT SHOULD BE REPORTED IMMEDIATELY: *FEVER GREATER THAN 100.4 F (38 C) OR HIGHER *CHILLS OR SWEATING *NAUSEA AND VOMITING THAT IS NOT CONTROLLED WITH YOUR NAUSEA MEDICATION *UNUSUAL SHORTNESS OF BREATH *UNUSUAL BRUISING OR BLEEDING *URINARY PROBLEMS (pain or burning when urinating, or frequent urination) *BOWEL PROBLEMS (unusual diarrhea, constipation, pain near the anus) TENDERNESS IN MOUTH AND THROAT WITH OR WITHOUT PRESENCE OF ULCERS (sore throat, sores in mouth, or a toothache) UNUSUAL RASH, SWELLING OR PAIN  UNUSUAL VAGINAL DISCHARGE OR ITCHING   Items with * indicate a potential emergency and should be followed up as soon as possible or go to the Emergency Department if any problems should occur.  Please show the CHEMOTHERAPY ALERT CARD or IMMUNOTHERAPY ALERT CARD at check-in to the  Emergency Department and triage nurse. Should you have questions after your visit or need to cancel or reschedule your appointment, please contact [facility]  [phone] and follow the prompts.  Office hours are 8:00 a.m. to 4:30 p.m. Monday - Friday. Please note that voicemails left after 4:00 p.m. may not be returned until the following business day.  We are closed weekends and major holidays. You have access to a nurse at all times for urgent questions. Please call the main number to the clinic [phone] and follow the prompts.  For any non-urgent questions, you may also contact your provider using MyChart. We now offer e-Visits for anyone 18 and older to request care online for non-urgent symptoms. For details visit [email or web address].   Also download the MyChart app! Go to the app store, search "MyChart", open the app, select [facility], and log in with your MyChart username and password.  Masks are optional in the cancer centers. If you would like for your care team to wear a mask while they are taking care of you, please let them know. You may have one support person who is at least 57 years old accompany you for your appointments. 

## 2021-12-18 NOTE — Progress Notes (Signed)
Hematology and Oncology Follow Up Visit  Tyler Phillips [id number] 1966-03-09 56 y.o. 12/18/2021   Principle Diagnosis:  IgG Kappa myeloma - +4, +14, +17   Past Therapy: Status post autologous stem cell transplant on 05/22/2015   S/p cycle 6 of RVD [place] 4 mg po q 2 week -- start on 01/02/2019 -- d/c on 02/10/2019   Current Therapy:        Velcade q 2wk dosing Revlimid '10mg'$  po q day (21/7)  - d/c on 12/07/2021   Interim History:  Tyler Phillips is in today for follow-up.  He is looking quite good.  He really has come around nicely.  He was hospitalized for a little bit because of significant pancytopenia.  Thankfully, we did not find anything that looked like his myeloma was progressing.  We may have found some possible mild dysplastic changes but nothing on the [facility] panel or cytogenetics.  We will continue him on the Velcade now.  I think this is very reasonable.  He has had no fever.  He has had no rashes.  He has had no diarrhea.  As always, he has been very active physically.  He has had no problems with nausea or vomiting.  He has had no obvious change in bowel or bladder habits.  He has had no cough or shortness of breath.  There has been no bleeding.  He has had no leg swelling.  When we last saw him back in August, there is no monoclonal spike in his blood.  His IgG level was 1600 mg/dL.  The Kappa light chain was 3.5 mg/dL.  Overall, I would have said that his performance status is probably ECOG 0.   Medications:  Allergies as of 12/18/2021   No Known Allergies      Medication List        Accurate as of December 18, 2021  2:11 PM. If you have any questions, ask your nurse or doctor.          aspirin 325 MG tablet Take 325 mg by mouth daily.   azithromycin 500 MG tablet Commonly known as: ZITHROMAX Take 500 mg by mouth daily as needed (For pre dental appointments).   bortezomib IV 3.5 MG injection Commonly known as: VELCADE Inject 1 mg/m2 into the vein  every 14 (fourteen) days.   cetirizine 10 MG tablet Commonly known as: ZYRTEC Take 10 mg by mouth daily.   multivitamin with minerals Tabs tablet Take 1 tablet by mouth daily.   ondansetron 4 MG tablet Commonly known as: ZOFRAN Take 1 tablet (4 mg total) by mouth every 6 (six) hours as needed for nausea.   valACYclovir 500 MG tablet Commonly known as: VALTREX Take 500 mg by mouth 2 (two) times daily.   zolpidem 10 MG tablet Commonly known as: AMBIEN Take 10 mg by mouth.   zolpidem 10 MG tablet Commonly known as: AMBIEN Take 1 tablet (10 mg total) by mouth at bedtime as needed for sleep.        Allergies: No Known Allergies  Past Medical History, Surgical history, Social history, and Family History were reviewed and updated.  Review of Systems: Review of Systems  Constitutional: Negative.   HENT: Negative.    Eyes: Negative.   Respiratory: Negative.    Cardiovascular: Negative.   Gastrointestinal: Negative.   Genitourinary: Negative.   Musculoskeletal: Negative.   Skin: Negative.   Neurological: Negative.   Endo/Heme/Allergies: Negative.   Psychiatric/Behavioral: Negative.  Physical Exam:  height is '6\' 1"'$  (1.854 m) and weight is 206 lb 0.6 oz (93.5 kg). His oral temperature is 97.7 F (36.5 C). His blood pressure is 141/82 (abnormal) and his pulse is 60. His respiration is 20 and oxygen saturation is 100%.   Wt Readings from Last 3 Encounters:  12/18/21 206 lb 0.6 oz (93.5 kg)  12/07/21 205 lb (93 kg)  11/18/21 214 lb (97.1 kg)   Physical Exam Vitals reviewed.  HENT:     Head: Normocephalic and atraumatic.  Eyes:     Pupils: Pupils are equal, round, and reactive to light.  Cardiovascular:     Rate and Rhythm: Normal rate and regular rhythm.     Heart sounds: Normal heart sounds.  Pulmonary:     Effort: Pulmonary effort is normal.     Breath sounds: Normal breath sounds.  Abdominal:     General: Bowel sounds are normal.     Palpations:  Abdomen is soft.  Musculoskeletal:        General: No tenderness or deformity. Normal range of motion.     Cervical back: Normal range of motion.  Lymphadenopathy:     Cervical: No cervical adenopathy.  Skin:    General: Skin is warm and dry.     Findings: No erythema or rash.  Neurological:     Mental Status: He is alert and oriented to person, place, and time.  Psychiatric:        Behavior: Behavior normal.        Thought Content: Thought content normal.        Judgment: Judgment normal.     Lab Results  Component Value Date   WBC 4.1 12/18/2021   HGB 12.0 (L) 12/18/2021   HCT 35.3 (L) 12/18/2021   MCV 104.4 (H) 12/18/2021   PLT 158 12/18/2021   Lab Results  Component Value Date   FERRITIN 1,263 (H) 07/20/2014   IRON 125 07/20/2014   TIBC 198 (L) 07/20/2014   UIBC 73 (L) 07/20/2014   IRONPCTSAT 63 (H) 07/20/2014   Lab Results  Component Value Date   RETICCTPCT 0.8 07/20/2014   RBC 3.38 (L) 12/18/2021   Lab Results  Component Value Date   KPAFRELGTCHN 35.3 (H) 12/07/2021   LAMBDASER 28.0 (H) 12/07/2021   KAPLAMBRATIO 1.26 12/07/2021   Lab Results  Component Value Date   IGGSERUM 1,608 12/07/2021   IGA 203 12/07/2021   IGMSERUM 32 12/07/2021   Lab Results  Component Value Date   TOTALPROTELP 7.2 12/07/2021   ALBUMINELP 3.9 12/07/2021   A1GS 0.2 12/07/2021   A2GS 0.8 12/07/2021   BETS 0.9 12/07/2021   BETA2SER 0.3 03/28/2015   GAMS 1.5 12/07/2021   MSPIKE Not Observed 12/07/2021   SPEI Comment 12/07/2021     Chemistry      Component Value Date/Time   NA 136 12/18/2021 0912   NA 140 04/22/2017 1140   NA 138 09/03/2015 1042   K 4.4 12/18/2021 0912   K 4.0 04/22/2017 1140   K 4.3 09/03/2015 1042   CL 105 12/18/2021 0912   CL 105 04/22/2017 1140   CO2 25 12/18/2021 0912   CO2 24 04/22/2017 1140   CO2 25 09/03/2015 1042   BUN 18 12/18/2021 0912   BUN 16 04/22/2017 1140   BUN 21.7 09/03/2015 1042   CREATININE 1.41 (H) 12/18/2021 0912    CREATININE 1.6 (H) 04/22/2017 1140   CREATININE 1.2 09/03/2015 1042      Component Value Date/Time  CALCIUM 8.9 12/18/2021 0912   CALCIUM 8.5 04/22/2017 1140   CALCIUM 9.1 09/03/2015 1042   ALKPHOS 62 12/18/2021 0912   ALKPHOS 59 04/22/2017 1140   ALKPHOS 42 09/03/2015 1042   AST 24 12/18/2021 0912   AST 27 09/03/2015 1042   ALT 38 12/18/2021 0912   ALT 45 04/22/2017 1140   ALT 41 09/03/2015 1042   BILITOT 0.4 12/18/2021 0912   BILITOT 0.54 09/03/2015 1042       Impression and Plan: Tyler Phillips is a very pleasant 56 yo caucasian gentleman with IgG kappa myeloma. He underwent induction chemotherapy with RVD followed by an autologous stem cell transplant at [facility] on February 2017.   We will continue him on the Velcade.  I think this would be very reasonable.  If we find that he is progressing with respect to his myeloma studies, that I will add Faspro.  We did send off a NGS panel for myelodysplasia just to see if there is anything there that might explain why he had the significant pancytopenia.  I am just happy that his quality of life is doing so well right now.  I will plan to get him back to see [place] in about 6 weeks.    [name], MD 9/1/20232:11 PM

## 2021-12-20 LAB — IGG, IGA, IGM
IgA: 191 mg/dL (ref 90–386)
IgG (Immunoglobin G), Serum: 1583 mg/dL (ref 603–1613)
IgM (Immunoglobulin M), Srm: 33 mg/dL (ref 20–172)

## 2021-12-21 ENCOUNTER — Encounter: Payer: Self-pay | Admitting: Hematology & Oncology

## 2021-12-22 LAB — KAPPA/LAMBDA LIGHT CHAINS
Kappa free light chain: 28.1 mg/L — ABNORMAL HIGH (ref 3.3–19.4)
Kappa, lambda light chain ratio: 1.31 (ref 0.26–1.65)
Lambda free light chains: 21.5 mg/L (ref 5.7–26.3)

## 2021-12-23 LAB — PROTEIN ELECTROPHORESIS, SERUM, WITH REFLEX
A/G Ratio: 1.2 (ref 0.7–1.7)
Albumin ELP: 3.8 g/dL (ref 2.9–4.4)
Alpha-1-Globulin: 0.2 g/dL (ref 0.0–0.4)
Alpha-2-Globulin: 0.7 g/dL (ref 0.4–1.0)
Beta Globulin: 0.8 g/dL (ref 0.7–1.3)
Gamma Globulin: 1.4 g/dL (ref 0.4–1.8)
Globulin, Total: 3.1 g/dL (ref 2.2–3.9)
Total Protein ELP: 6.9 g/dL (ref 6.0–8.5)

## 2021-12-24 ENCOUNTER — Inpatient Hospital Stay: Payer: Medicare Other

## 2021-12-24 ENCOUNTER — Other Ambulatory Visit: Payer: Self-pay

## 2021-12-24 DIAGNOSIS — Z5112 Encounter for antineoplastic immunotherapy: Secondary | ICD-10-CM | POA: Diagnosis not present

## 2021-12-24 DIAGNOSIS — Z79899 Other long term (current) drug therapy: Secondary | ICD-10-CM | POA: Diagnosis not present

## 2021-12-24 DIAGNOSIS — C9000 Multiple myeloma not having achieved remission: Secondary | ICD-10-CM | POA: Diagnosis not present

## 2021-12-29 LAB — UPEP/UIFE/LIGHT CHAINS/TP, 24-HR UR
% BETA, Urine: 0 %
ALPHA 1 URINE: 0 %
Albumin, U: 0 %
Alpha 2, Urine: 0 %
Free Kappa Lt Chains,Ur: 3.06 mg/L (ref 1.17–86.46)
Free Kappa/Lambda Ratio: >4.43 (ref 1.83–14.26)
Free Lambda Lt Chains,Ur: <0.69 mg/L (ref 0.27–15.21)
GAMMA GLOBULIN URINE: 0 %
Total Protein, Urine-Ur/day: <128 mg/24 hr (ref 30–150)
Total Protein, Urine: <4 mg/dL
Total Volume: 3200

## 2021-12-29 LAB — MYELODYSPLASTIC SYNDROME (MDS) PANEL

## 2022-01-01 ENCOUNTER — Inpatient Hospital Stay: Payer: Medicare Other

## 2022-01-01 ENCOUNTER — Ambulatory Visit: Payer: Medicare Other | Admitting: Family

## 2022-01-01 ENCOUNTER — Encounter: Payer: Self-pay | Admitting: Hematology & Oncology

## 2022-01-01 VITALS — BP 127/94 | HR 58 | Temp 98.0°F | Ht 73.0 in | Wt 205.7 lb

## 2022-01-01 DIAGNOSIS — C9000 Multiple myeloma not having achieved remission: Secondary | ICD-10-CM | POA: Diagnosis not present

## 2022-01-01 DIAGNOSIS — C9001 Multiple myeloma in remission: Secondary | ICD-10-CM

## 2022-01-01 DIAGNOSIS — Z79899 Other long term (current) drug therapy: Secondary | ICD-10-CM | POA: Diagnosis not present

## 2022-01-01 DIAGNOSIS — Z5112 Encounter for antineoplastic immunotherapy: Secondary | ICD-10-CM | POA: Diagnosis not present

## 2022-01-01 LAB — CMP (CANCER CENTER ONLY)
ALT: 59 U/L — ABNORMAL HIGH (ref 0–44)
AST: 35 U/L (ref 15–41)
Albumin: 4.3 g/dL (ref 3.5–5.0)
Alkaline Phosphatase: 59 U/L (ref 38–126)
Anion gap: 7 (ref 5–15)
BUN: 18 mg/dL (ref 6–20)
CO2: 25 mmol/L (ref 22–32)
Calcium: 9.4 mg/dL (ref 8.9–10.3)
Chloride: 104 mmol/L (ref 98–111)
Creatinine: 1.46 mg/dL — ABNORMAL HIGH (ref 0.61–1.24)
GFR, Estimated: 56 mL/min — ABNORMAL LOW (ref >60)
Glucose, Bld: 91 mg/dL (ref 70–99)
Potassium: 4 mmol/L (ref 3.5–5.1)
Sodium: 136 mmol/L (ref 135–145)
Total Bilirubin: 0.5 mg/dL (ref 0.3–1.2)
Total Protein: 7.6 g/dL (ref 6.5–8.1)

## 2022-01-01 LAB — CBC WITH DIFFERENTIAL (CANCER CENTER ONLY)
Abs Immature Granulocytes: 0.01 10*3/uL (ref 0.00–0.07)
Basophils Absolute: 0 10*3/uL (ref 0.0–0.1)
Basophils Relative: 0 %
Eosinophils Absolute: 0 10*3/uL (ref 0.0–0.5)
Eosinophils Relative: 1 %
HCT: 38.3 % — ABNORMAL LOW (ref 39.0–52.0)
Hemoglobin: 13.5 g/dL (ref 13.0–17.0)
Immature Granulocytes: 0 %
Lymphocytes Relative: 35 %
Lymphs Abs: 1.7 10*3/uL (ref 0.7–4.0)
MCH: 36.1 pg — ABNORMAL HIGH (ref 26.0–34.0)
MCHC: 35.2 g/dL (ref 30.0–36.0)
MCV: 102.4 fL — ABNORMAL HIGH (ref 80.0–100.0)
Monocytes Absolute: 0.3 10*3/uL (ref 0.1–1.0)
Monocytes Relative: 7 %
Neutro Abs: 2.7 10*3/uL (ref 1.7–7.7)
Neutrophils Relative %: 57 %
Platelet Count: 157 10*3/uL (ref 150–400)
RBC: 3.74 MIL/uL — ABNORMAL LOW (ref 4.22–5.81)
RDW: 13.3 % (ref 11.5–15.5)
WBC Count: 4.8 10*3/uL (ref 4.0–10.5)
nRBC: 0 % (ref 0.0–0.2)

## 2022-01-01 MED ORDER — BORTEZOMIB CHEMO SQ INJECTION 3.5 MG (2.5MG/ML)
1.3000 mg/m2 | Freq: Once | INTRAMUSCULAR | Status: AC
  Administered 2022-01-01: 2.75 mg via SUBCUTANEOUS
  Filled 2022-01-01: qty 1.1

## 2022-01-01 MED ORDER — PROCHLORPERAZINE MALEATE 10 MG PO TABS: 10.0000 mg | ORAL_TABLET | Freq: Once | ORAL | Status: DC

## 2022-01-01 NOTE — Patient Instructions (Signed)
Bortezomib Injection What is this medication? BORTEZOMIB (bor TEZ oh mib) treats lymphoma. It may also be used to treat multiple myeloma, a type of bone marrow cancer. It works by blocking a protein that causes cancer cells to grow and multiply. This helps to slow or stop the spread of cancer cells. This medicine may be used for other purposes; ask your health care provider or pharmacist if you have questions. COMMON BRAND NAME(S): Velcade What should I tell my care team before I take this medication? They need to know if you have any of these conditions: Dehydration Diabetes Heart disease Liver disease Tingling of the fingers or toes or other nerve disorder An unusual or allergic reaction to bortezomib, other medications, foods, dyes, or preservatives If you or your partner are pregnant or trying to get pregnant Breastfeeding How should I use this medication? This medication is injected into a vein or under the skin. It is given by your care team in a hospital or clinic setting. Talk to your care team about the use of this medication in children. Special care may be needed. Overdosage: If you think you have taken too much of this medicine contact a poison control center or emergency room at once. NOTE: This medicine is only for you. Do not share this medicine with others. What if I miss a dose? Keep appointments for follow-up doses. It is important not to miss your dose. Call your care team if you are unable to keep an appointment. What may interact with this medication? Ketoconazole Rifampin This list may not describe all possible interactions. Give your health care provider a list of all the medicines, herbs, non-prescription drugs, or dietary supplements you use. Also tell them if you smoke, drink alcohol, or use illegal drugs. Some items may interact with your medicine. What should I watch for while using this medication? Your condition will be monitored carefully while you are  receiving this medication. You may need blood work while taking this medication. This medication may affect your coordination, reaction time, or judgment. Do not drive or operate machinery until you know how this medication affects you. Sit up or stand slowly to reduce the risk of dizzy or fainting spells. Drinking alcohol with this medication can increase the risk of these side effects. This medication may increase your risk of getting an infection. Call your care team for advice if you get a fever, chills, sore throat, or other symptoms of a cold or flu. Do not treat yourself. Try to avoid being around people who are sick. Check with your care team if you have severe diarrhea, nausea, and vomiting, or if you sweat a lot. The loss of too much body fluid may make it dangerous for you to take this medication. Talk to your care team if you may be pregnant. Serious birth defects can occur if you take this medication during pregnancy and for 7 months after the last dose. You will need a negative pregnancy test before starting this medication. Contraception is recommended while taking this medication and for 7 months after the last dose. Your care team can help you find the option that works for you. If your partner can get pregnant, use a condom during sex while taking this medication and for 4 months after the last dose. Do not breastfeed while taking this medication and for 2 months after the last dose. This medication may cause infertility. Talk to your care team if you are concerned about your fertility. What side effects   may I notice from receiving this medication? Side effects that you should report to your care team as soon as possible: Allergic reactions--skin rash, itching, hives, swelling of the face, lips, tongue, or throat Bleeding--bloody or black, tar-like stools, vomiting blood or brown material that looks like coffee grounds, red or dark brown urine, small red or purple spots on skin, unusual  bruising or bleeding Bleeding in the brain--severe headache, stiff neck, confusion, dizziness, change in vision, numbness or weakness of the face, arm, or leg, trouble speaking, trouble walking, vomiting Bowel blockage--stomach cramping, unable to have a bowel movement or pass gas, loss of appetite, vomiting Heart failure--shortness of breath, swelling of the ankles, feet, or hands, sudden weight gain, unusual weakness or fatigue Infection--fever, chills, cough, sore throat, wounds that don't heal, pain or trouble when passing urine, general feeling of discomfort or being unwell Liver injury--right upper belly pain, loss of appetite, nausea, light-colored stool, dark yellow or brown urine, yellowing skin or eyes, unusual weakness or fatigue Low blood pressure--dizziness, feeling faint or lightheaded, blurry vision Lung injury--shortness of breath or trouble breathing, cough, spitting up blood, chest pain, fever Pain, tingling, or numbness in the hands or feet Severe or prolonged diarrhea Stomach pain, bloody diarrhea, pale skin, unusual weakness or fatigue, decrease in the amount of urine, which may be signs of hemolytic uremic syndrome Sudden and severe headache, confusion, change in vision, seizures, which may be signs of posterior reversible encephalopathy syndrome (PRES) TTP--purple spots on the skin or inside the mouth, pale skin, yellowing skin or eyes, unusual weakness or fatigue, fever, fast or irregular heartbeat, confusion, change in vision, trouble speaking, trouble walking Tumor lysis syndrome (TLS)--nausea, vomiting, diarrhea, decrease in the amount of urine, dark urine, unusual weakness or fatigue, confusion, muscle pain or cramps, fast or irregular heartbeat, joint pain Side effects that usually do not require medical attention (report to your care team if they continue or are bothersome): Constipation Diarrhea Fatigue Loss of appetite Nausea This list may not describe all possible  side effects. Call your doctor for medical advice about side effects. You may report side effects to FDA at 1-800-FDA-1088. Where should I keep my medication? This medication is given in a hospital or clinic. It will not be stored at home. NOTE: This sheet is a summary. It may not cover all possible information. If you have questions about this medicine, talk to your doctor, pharmacist, or health care provider.  2023 Elsevier/Gold Standard (2021-09-02 00:00:00)

## 2022-01-01 NOTE — Progress Notes (Signed)
CBC and CMET reviewed by provider, ok to treat with Serum Creat.1.41.

## 2022-01-15 ENCOUNTER — Other Ambulatory Visit: Payer: Self-pay

## 2022-01-15 ENCOUNTER — Inpatient Hospital Stay: Payer: Medicare Other

## 2022-01-15 VITALS — BP 125/88 | HR 66 | Temp 97.9°F | Resp 18

## 2022-01-15 DIAGNOSIS — C9001 Multiple myeloma in remission: Secondary | ICD-10-CM

## 2022-01-15 DIAGNOSIS — Z5112 Encounter for antineoplastic immunotherapy: Secondary | ICD-10-CM | POA: Diagnosis not present

## 2022-01-15 DIAGNOSIS — Z79899 Other long term (current) drug therapy: Secondary | ICD-10-CM | POA: Diagnosis not present

## 2022-01-15 DIAGNOSIS — C9000 Multiple myeloma not having achieved remission: Secondary | ICD-10-CM | POA: Diagnosis not present

## 2022-01-15 LAB — CBC WITH DIFFERENTIAL (CANCER CENTER ONLY)
Abs Immature Granulocytes: 0.02 10*3/uL (ref 0.00–0.07)
Basophils Absolute: 0 10*3/uL (ref 0.0–0.1)
Basophils Relative: 0 %
Eosinophils Absolute: 0 10*3/uL (ref 0.0–0.5)
Eosinophils Relative: 1 %
HCT: 37.6 % — ABNORMAL LOW (ref 39.0–52.0)
Hemoglobin: 13.4 g/dL (ref 13.0–17.0)
Immature Granulocytes: 0 %
Lymphocytes Relative: 35 %
Lymphs Abs: 1.8 10*3/uL (ref 0.7–4.0)
MCH: 36.1 pg — ABNORMAL HIGH (ref 26.0–34.0)
MCHC: 35.6 g/dL (ref 30.0–36.0)
MCV: 101.3 fL — ABNORMAL HIGH (ref 80.0–100.0)
Monocytes Absolute: 0.3 10*3/uL (ref 0.1–1.0)
Monocytes Relative: 6 %
Neutro Abs: 2.9 10*3/uL (ref 1.7–7.7)
Neutrophils Relative %: 58 %
Platelet Count: 167 10*3/uL (ref 150–400)
RBC: 3.71 MIL/uL — ABNORMAL LOW (ref 4.22–5.81)
RDW: 13 % (ref 11.5–15.5)
WBC Count: 5.1 10*3/uL (ref 4.0–10.5)
nRBC: 0 % (ref 0.0–0.2)

## 2022-01-15 LAB — CMP (CANCER CENTER ONLY)
ALT: 46 U/L — ABNORMAL HIGH (ref 0–44)
AST: 29 U/L (ref 15–41)
Albumin: 4.5 g/dL (ref 3.5–5.0)
Alkaline Phosphatase: 57 U/L (ref 38–126)
Anion gap: 5 (ref 5–15)
BUN: 18 mg/dL (ref 6–20)
CO2: 26 mmol/L (ref 22–32)
Calcium: 9.4 mg/dL (ref 8.9–10.3)
Chloride: 106 mmol/L (ref 98–111)
Creatinine: 1.47 mg/dL — ABNORMAL HIGH (ref 0.61–1.24)
GFR, Estimated: 55 mL/min — ABNORMAL LOW (ref >60)
Glucose, Bld: 97 mg/dL (ref 70–99)
Potassium: 4.4 mmol/L (ref 3.5–5.1)
Sodium: 137 mmol/L (ref 135–145)
Total Bilirubin: 0.7 mg/dL (ref 0.3–1.2)
Total Protein: 7.4 g/dL (ref 6.5–8.1)

## 2022-01-15 MED ORDER — BORTEZOMIB CHEMO SQ INJECTION 3.5 MG (2.5MG/ML)
1.3000 mg/m2 | Freq: Once | INTRAMUSCULAR | Status: AC
  Administered 2022-01-15: 2.75 mg via SUBCUTANEOUS
  Filled 2022-01-15: qty 1.1

## 2022-01-15 MED ORDER — PROCHLORPERAZINE MALEATE 10 MG PO TABS: 10.0000 mg | ORAL_TABLET | Freq: Once | ORAL | Status: DC

## 2022-01-15 NOTE — Patient Instructions (Signed)
[facility]  Discharge Instructions: Thank you for choosing [facility] to provide your oncology and hematology care.   If you have a lab appointment with the Cancer Center, please go directly to the Cancer Center and check in at the registration area.  Wear comfortable clothing and clothing appropriate for easy access to any Portacath or PICC line.   We strive to give you quality time with your provider. You may need to reschedule your appointment if you arrive late (15 or more minutes).  Arriving late affects you and other patients whose appointments are after yours.  Also, if you miss three or more appointments without notifying the office, you may be dismissed from the clinic at the provider's discretion.      For prescription refill requests, have your pharmacy contact our office and allow 72 hours for refills to be completed.    Today you received the following chemotherapy and/or immunotherapy agents Velcade      To help prevent nausea and vomiting after your treatment, we encourage you to take your nausea medication as directed.  BELOW ARE SYMPTOMS THAT SHOULD BE REPORTED IMMEDIATELY: *FEVER GREATER THAN 100.4 F (38 C) OR HIGHER *CHILLS OR SWEATING *NAUSEA AND VOMITING THAT IS NOT CONTROLLED WITH YOUR NAUSEA MEDICATION *UNUSUAL SHORTNESS OF BREATH *UNUSUAL BRUISING OR BLEEDING *URINARY PROBLEMS (pain or burning when urinating, or frequent urination) *BOWEL PROBLEMS (unusual diarrhea, constipation, pain near the anus) TENDERNESS IN MOUTH AND THROAT WITH OR WITHOUT PRESENCE OF ULCERS (sore throat, sores in mouth, or a toothache) UNUSUAL RASH, SWELLING OR PAIN  UNUSUAL VAGINAL DISCHARGE OR ITCHING   Items with * indicate a potential emergency and should be followed up as soon as possible or go to the Emergency Department if any problems should occur.  Please show the CHEMOTHERAPY ALERT CARD or IMMUNOTHERAPY ALERT CARD at check-in to the  Emergency Department and triage nurse. Should you have questions after your visit or need to cancel or reschedule your appointment, please contact [facility]  [phone] and follow the prompts.  Office hours are 8:00 a.m. to 4:30 p.m. Monday - Friday. Please note that voicemails left after 4:00 p.m. may not be returned until the following business day.  We are closed weekends and major holidays. You have access to a nurse at all times for urgent questions. Please call the main number to the clinic [phone] and follow the prompts.  For any non-urgent questions, you may also contact your provider using MyChart. We now offer e-Visits for anyone 18 and older to request care online for non-urgent symptoms. For details visit [email or web address].   Also download the MyChart app! Go to the app store, search "MyChart", open the app, select [facility], and log in with your MyChart username and password.  Masks are optional in the cancer centers. If you would like for your care team to wear a mask while they are taking care of you, please let them know. You may have one support person who is at least 57 years old accompany you for your appointments. 

## 2022-02-01 ENCOUNTER — Inpatient Hospital Stay: Payer: Medicare Other | Attending: Hematology & Oncology

## 2022-02-01 ENCOUNTER — Other Ambulatory Visit: Payer: Self-pay

## 2022-02-01 ENCOUNTER — Inpatient Hospital Stay: Payer: Medicare Other

## 2022-02-01 ENCOUNTER — Inpatient Hospital Stay (HOSPITAL_BASED_OUTPATIENT_CLINIC_OR_DEPARTMENT_OTHER): Payer: Medicare Other | Admitting: Family

## 2022-02-01 ENCOUNTER — Telehealth: Payer: Self-pay | Admitting: *Deleted

## 2022-02-01 ENCOUNTER — Encounter: Payer: Self-pay | Admitting: Family

## 2022-02-01 VITALS — BP 135/87 | HR 59 | Temp 97.7°F | Resp 18 | Ht 73.0 in | Wt 210.0 lb

## 2022-02-01 DIAGNOSIS — Z5112 Encounter for antineoplastic immunotherapy: Secondary | ICD-10-CM | POA: Insufficient documentation

## 2022-02-01 DIAGNOSIS — Z79899 Other long term (current) drug therapy: Secondary | ICD-10-CM | POA: Diagnosis not present

## 2022-02-01 DIAGNOSIS — Z9484 Stem cells transplant status: Secondary | ICD-10-CM | POA: Diagnosis not present

## 2022-02-01 DIAGNOSIS — C9000 Multiple myeloma not having achieved remission: Secondary | ICD-10-CM | POA: Insufficient documentation

## 2022-02-01 DIAGNOSIS — C9001 Multiple myeloma in remission: Secondary | ICD-10-CM

## 2022-02-01 LAB — CBC WITH DIFFERENTIAL (CANCER CENTER ONLY)
Abs Immature Granulocytes: 0.01 10*3/uL (ref 0.00–0.07)
Basophils Absolute: 0 10*3/uL (ref 0.0–0.1)
Basophils Relative: 0 %
Eosinophils Absolute: 0 10*3/uL (ref 0.0–0.5)
Eosinophils Relative: 0 %
HCT: 40 % (ref 39.0–52.0)
Hemoglobin: 14 g/dL (ref 13.0–17.0)
Immature Granulocytes: 0 %
Lymphocytes Relative: 29 %
Lymphs Abs: 1.4 10*3/uL (ref 0.7–4.0)
MCH: 35.5 pg — ABNORMAL HIGH (ref 26.0–34.0)
MCHC: 35 g/dL (ref 30.0–36.0)
MCV: 101.5 fL — ABNORMAL HIGH (ref 80.0–100.0)
Monocytes Absolute: 0.3 10*3/uL (ref 0.1–1.0)
Monocytes Relative: 6 %
Neutro Abs: 3.2 10*3/uL (ref 1.7–7.7)
Neutrophils Relative %: 65 %
Platelet Count: 153 10*3/uL (ref 150–400)
RBC: 3.94 MIL/uL — ABNORMAL LOW (ref 4.22–5.81)
RDW: 12.7 % (ref 11.5–15.5)
WBC Count: 4.9 10*3/uL (ref 4.0–10.5)
nRBC: 0 % (ref 0.0–0.2)

## 2022-02-01 LAB — CMP (CANCER CENTER ONLY)
ALT: 39 U/L (ref 0–44)
AST: 27 U/L (ref 15–41)
Albumin: 4.6 g/dL (ref 3.5–5.0)
Alkaline Phosphatase: 57 U/L (ref 38–126)
Anion gap: 7 (ref 5–15)
BUN: 25 mg/dL — ABNORMAL HIGH (ref 6–20)
CO2: 27 mmol/L (ref 22–32)
Calcium: 9.9 mg/dL (ref 8.9–10.3)
Chloride: 103 mmol/L (ref 98–111)
Creatinine: 1.57 mg/dL — ABNORMAL HIGH (ref 0.61–1.24)
GFR, Estimated: 51 mL/min — ABNORMAL LOW (ref >60)
Glucose, Bld: 93 mg/dL (ref 70–99)
Potassium: 4.2 mmol/L (ref 3.5–5.1)
Sodium: 137 mmol/L (ref 135–145)
Total Bilirubin: 0.7 mg/dL (ref 0.3–1.2)
Total Protein: 7.7 g/dL (ref 6.5–8.1)

## 2022-02-01 LAB — LACTATE DEHYDROGENASE: LDH: 151 U/L (ref 98–192)

## 2022-02-01 MED ORDER — PROCHLORPERAZINE MALEATE 10 MG PO TABS: 10.0000 mg | ORAL_TABLET | Freq: Once | ORAL | Status: DC

## 2022-02-01 MED ORDER — BORTEZOMIB CHEMO SQ INJECTION 3.5 MG (2.5MG/ML)
1.3000 mg/m2 | Freq: Once | INTRAMUSCULAR | Status: AC
  Administered 2022-02-01: 2.75 mg via SUBCUTANEOUS
  Filled 2022-02-01: qty 1.1

## 2022-02-01 NOTE — Patient Instructions (Signed)
[facility]  Discharge Instructions: Thank you for choosing [facility] to provide your oncology and hematology care.   If you have a lab appointment with the Cancer Center, please go directly to the Cancer Center and check in at the registration area.  Wear comfortable clothing and clothing appropriate for easy access to any Portacath or PICC line.   We strive to give you quality time with your provider. You may need to reschedule your appointment if you arrive late (15 or more minutes).  Arriving late affects you and other patients whose appointments are after yours.  Also, if you miss three or more appointments without notifying the office, you may be dismissed from the clinic at the provider's discretion.      For prescription refill requests, have your pharmacy contact our office and allow 72 hours for refills to be completed.    Today you received the following chemotherapy and/or immunotherapy agents Velcade      To help prevent nausea and vomiting after your treatment, we encourage you to take your nausea medication as directed.  BELOW ARE SYMPTOMS THAT SHOULD BE REPORTED IMMEDIATELY: *FEVER GREATER THAN 100.4 F (38 C) OR HIGHER *CHILLS OR SWEATING *NAUSEA AND VOMITING THAT IS NOT CONTROLLED WITH YOUR NAUSEA MEDICATION *UNUSUAL SHORTNESS OF BREATH *UNUSUAL BRUISING OR BLEEDING *URINARY PROBLEMS (pain or burning when urinating, or frequent urination) *BOWEL PROBLEMS (unusual diarrhea, constipation, pain near the anus) TENDERNESS IN MOUTH AND THROAT WITH OR WITHOUT PRESENCE OF ULCERS (sore throat, sores in mouth, or a toothache) UNUSUAL RASH, SWELLING OR PAIN  UNUSUAL VAGINAL DISCHARGE OR ITCHING   Items with * indicate a potential emergency and should be followed up as soon as possible or go to the Emergency Department if any problems should occur.  Please show the CHEMOTHERAPY ALERT CARD or IMMUNOTHERAPY ALERT CARD at check-in to the  Emergency Department and triage nurse. Should you have questions after your visit or need to cancel or reschedule your appointment, please contact [facility]  [phone] and follow the prompts.  Office hours are 8:00 a.m. to 4:30 p.m. Monday - Friday. Please note that voicemails left after 4:00 p.m. may not be returned until the following business day.  We are closed weekends and major holidays. You have access to a nurse at all times for urgent questions. Please call the main number to the clinic [phone] and follow the prompts.  For any non-urgent questions, you may also contact your provider using MyChart. We now offer e-Visits for anyone 18 and older to request care online for non-urgent symptoms. For details visit [email or web address].   Also download the MyChart app! Go to the app store, search "MyChart", open the app, select [facility], and log in with your MyChart username and password.  Masks are optional in the cancer centers. If you would like for your care team to wear a mask while they are taking care of you, please let them know. You may have one support person who is at least 57 years old accompany you for your appointments. 

## 2022-02-01 NOTE — Progress Notes (Signed)
Reviewed labs with Lottie Dawson NP and pt ok to treat with creatinine 1.57

## 2022-02-01 NOTE — Progress Notes (Signed)
Hematology and Oncology Follow Up Visit  Tyler Phillips [id number] Apr 12, 1966 56 y.o. 02/01/2022   Principle Diagnosis:  IgG Kappa myeloma - +4, +14, +17   Past Therapy: Status post autologous stem cell transplant on 05/22/2015   S/p cycle 6 of RVD Ninlaro 4 mg po q 2 week -- start on 01/02/2019 -- d/c on 02/10/2019   Current Therapy:        Velcade q 2wk dosing Revlimid '10mg'$  po q day (21/7)  - d/c on 12/07/2021   Interim History:  [name] is here today for follow-up and Velcade. He is doing well and denies fatigue at this time. He has been enjoying being home and has done lots of yard work.  No M-spike observed in September, IgG level was 1,583 mg/dL and kappa light chains 2.81 mg/dL.  Hgb 14.0, MCV 101, WBC count 4.9 and platelets 153.  No fever, chills, n/v, cough, rash, dizziness, SOB, chest pain, palpitations, abdominal pain or changes in bowel or bladder habits.  No blood loss, bruising or petechiae.  No swelling or tenderness in his extremities.  Neuropathy in his feet unchanged from baseline.  No falls or syncope.  Appetite and hydration have been good. Weight stable at 210 lbs.   ECOG Performance Status: 1 - Symptomatic but completely ambulatory  Medications:  Allergies as of 02/01/2022   No Known Allergies      Medication List        Accurate as of February 01, 2022  8:58 AM. If you have any questions, ask your nurse or doctor.          aspirin 325 MG tablet Take 325 mg by mouth daily.   azithromycin 500 MG tablet Commonly known as: ZITHROMAX Take 500 mg by mouth daily as needed (For pre dental appointments).   bortezomib IV 3.5 MG injection Commonly known as: VELCADE Inject 1 mg/m2 into the vein every 14 (fourteen) days.   cetirizine 10 MG tablet Commonly known as: ZYRTEC Take 10 mg by mouth daily.   multivitamin with minerals Tabs tablet Take 1 tablet by mouth daily.   ondansetron 4 MG tablet Commonly known as: ZOFRAN Take 1 tablet (4  mg total) by mouth every 6 (six) hours as needed for nausea.   valACYclovir 500 MG tablet Commonly known as: VALTREX Take 500 mg by mouth 2 (two) times daily.   zolpidem 10 MG tablet Commonly known as: AMBIEN Take 1 tablet (10 mg total) by mouth at bedtime as needed for sleep. What changed: Another medication with the same name was removed. Continue taking this medication, and follow the directions you see here. Changed by: Lottie Dawson, NP        Allergies: No Known Allergies  Past Medical History, Surgical history, Social history, and Family History were reviewed and updated.  Review of Systems: All other 10 point review of systems is negative.   Physical Exam:  height is '6\' 1"'$  (1.854 m) and weight is 210 lb (95.3 kg). His oral temperature is 97.7 F (36.5 C). His blood pressure is 135/87 and his pulse is 59 (abnormal). His respiration is 18 and oxygen saturation is 100%.   Wt Readings from Last 3 Encounters:  02/01/22 210 lb (95.3 kg)  01/01/22 205 lb 11.2 oz (93.3 kg)  12/18/21 206 lb 0.6 oz (93.5 kg)    Ocular: Sclerae unicteric, pupils equal, round and reactive to light Ear-nose-throat: Oropharynx clear, dentition fair Lymphatic: No cervical or supraclavicular adenopathy Lungs no rales or rhonchi,  good excursion bilaterally Heart regular rate and rhythm, no murmur appreciated Abd soft, nontender, positive bowel sounds MSK no focal spinal tenderness, no joint edema Neuro: non-focal, well-oriented, appropriate affect Breasts: Deferred   Lab Results  Component Value Date   WBC 4.9 02/01/2022   HGB 14.0 02/01/2022   HCT 40.0 02/01/2022   MCV 101.5 (H) 02/01/2022   PLT 153 02/01/2022   Lab Results  Component Value Date   FERRITIN 1,263 (H) 07/20/2014   IRON 125 07/20/2014   TIBC 198 (L) 07/20/2014   UIBC 73 (L) 07/20/2014   IRONPCTSAT 63 (H) 07/20/2014   Lab Results  Component Value Date   RETICCTPCT 0.8 07/20/2014   RBC 3.94 (L) 02/01/2022   Lab  Results  Component Value Date   KPAFRELGTCHN 28.1 (H) 12/18/2021   LAMBDASER 21.5 12/18/2021   KAPLAMBRATIO >4.43 12/24/2021   Lab Results  Component Value Date   IGGSERUM 1,583 12/18/2021   IGA 191 12/18/2021   IGMSERUM 33 12/18/2021   Lab Results  Component Value Date   TOTALPROTELP 6.9 12/18/2021   ALBUMINELP 3.8 12/18/2021   A1GS 0.2 12/18/2021   A2GS 0.7 12/18/2021   BETS 0.8 12/18/2021   BETA2SER 0.3 03/28/2015   GAMS 1.4 12/18/2021   MSPIKE Not Observed 12/18/2021   SPEI Comment 12/07/2021     Chemistry      Component Value Date/Time   NA 137 02/01/2022 0818   NA 140 04/22/2017 1140   NA 138 09/03/2015 1042   K 4.2 02/01/2022 0818   K 4.0 04/22/2017 1140   K 4.3 09/03/2015 1042   CL 103 02/01/2022 0818   CL 105 04/22/2017 1140   CO2 27 02/01/2022 0818   CO2 24 04/22/2017 1140   CO2 25 09/03/2015 1042   BUN 25 (H) 02/01/2022 0818   BUN 16 04/22/2017 1140   BUN 21.7 09/03/2015 1042   CREATININE 1.57 (H) 02/01/2022 0818   CREATININE 1.6 (H) 04/22/2017 1140   CREATININE 1.2 09/03/2015 1042      Component Value Date/Time   CALCIUM 9.9 02/01/2022 0818   CALCIUM 8.5 04/22/2017 1140   CALCIUM 9.1 09/03/2015 1042   ALKPHOS 57 02/01/2022 0818   ALKPHOS 59 04/22/2017 1140   ALKPHOS 42 09/03/2015 1042   AST 27 02/01/2022 0818   AST 27 09/03/2015 1042   ALT 39 02/01/2022 0818   ALT 45 04/22/2017 1140   ALT 41 09/03/2015 1042   BILITOT 0.7 02/01/2022 0818   BILITOT 0.54 09/03/2015 1042       Impression and Plan: [name] is a very pleasant 56 yo caucasian gentleman with IgG kappa myeloma. He underwent induction chemotherapy with RVD followed by an autologous stem cell transplant at [facility] on February 2017.  Revlemid d/c'd 12/07/2021.  We will proceed with Velcade today per MD. Follow-up, lab and injection in 2 weeks.  Lottie Dawson, NP 10/16/20238:58 AM

## 2022-02-01 NOTE — Telephone Encounter (Signed)
Per 02/01/22 los - called and lvm of upcoming appointment - mailed calendar

## 2022-02-02 ENCOUNTER — Other Ambulatory Visit: Payer: Self-pay

## 2022-02-02 LAB — KAPPA/LAMBDA LIGHT CHAINS
Kappa free light chain: 28.4 mg/L — ABNORMAL HIGH (ref 3.3–19.4)
Kappa, lambda light chain ratio: 1.55 (ref 0.26–1.65)
Lambda free light chains: 18.3 mg/L (ref 5.7–26.3)

## 2022-02-02 LAB — IGG, IGA, IGM
IgA: 200 mg/dL (ref 90–386)
IgG (Immunoglobin G), Serum: 1469 mg/dL (ref 603–1613)
IgM (Immunoglobulin M), Srm: 25 mg/dL (ref 20–172)

## 2022-02-15 ENCOUNTER — Telehealth: Payer: Self-pay | Admitting: *Deleted

## 2022-02-15 ENCOUNTER — Inpatient Hospital Stay (HOSPITAL_BASED_OUTPATIENT_CLINIC_OR_DEPARTMENT_OTHER): Payer: Medicare Other | Admitting: Family

## 2022-02-15 ENCOUNTER — Inpatient Hospital Stay: Payer: Medicare Other

## 2022-02-15 ENCOUNTER — Encounter: Payer: Self-pay | Admitting: Family

## 2022-02-15 VITALS — BP 128/87 | HR 62 | Temp 97.8°F | Resp 18 | Wt 214.0 lb

## 2022-02-15 DIAGNOSIS — Z79899 Other long term (current) drug therapy: Secondary | ICD-10-CM | POA: Diagnosis not present

## 2022-02-15 DIAGNOSIS — Z5112 Encounter for antineoplastic immunotherapy: Secondary | ICD-10-CM | POA: Diagnosis not present

## 2022-02-15 DIAGNOSIS — C9001 Multiple myeloma in remission: Secondary | ICD-10-CM

## 2022-02-15 DIAGNOSIS — C9000 Multiple myeloma not having achieved remission: Secondary | ICD-10-CM

## 2022-02-15 DIAGNOSIS — Z9484 Stem cells transplant status: Secondary | ICD-10-CM | POA: Diagnosis not present

## 2022-02-15 LAB — CMP (CANCER CENTER ONLY)
ALT: 36 U/L (ref 0–44)
AST: 24 U/L (ref 15–41)
Albumin: 4.3 g/dL (ref 3.5–5.0)
Alkaline Phosphatase: 48 U/L (ref 38–126)
Anion gap: 7 (ref 5–15)
BUN: 17 mg/dL (ref 6–20)
CO2: 23 mmol/L (ref 22–32)
Calcium: 8.9 mg/dL (ref 8.9–10.3)
Chloride: 107 mmol/L (ref 98–111)
Creatinine: 1.43 mg/dL — ABNORMAL HIGH (ref 0.61–1.24)
GFR, Estimated: 57 mL/min — ABNORMAL LOW (ref >60)
Glucose, Bld: 109 mg/dL — ABNORMAL HIGH (ref 70–99)
Potassium: 4.3 mmol/L (ref 3.5–5.1)
Sodium: 137 mmol/L (ref 135–145)
Total Bilirubin: 0.5 mg/dL (ref 0.3–1.2)
Total Protein: 6.7 g/dL (ref 6.5–8.1)

## 2022-02-15 LAB — CBC WITH DIFFERENTIAL (CANCER CENTER ONLY)
Abs Immature Granulocytes: 0.01 10*3/uL (ref 0.00–0.07)
Basophils Absolute: 0 10*3/uL (ref 0.0–0.1)
Basophils Relative: 0 %
Eosinophils Absolute: 0 10*3/uL (ref 0.0–0.5)
Eosinophils Relative: 1 %
HCT: 38.2 % — ABNORMAL LOW (ref 39.0–52.0)
Hemoglobin: 13.4 g/dL (ref 13.0–17.0)
Immature Granulocytes: 0 %
Lymphocytes Relative: 29 %
Lymphs Abs: 1.4 10*3/uL (ref 0.7–4.0)
MCH: 35.3 pg — ABNORMAL HIGH (ref 26.0–34.0)
MCHC: 35.1 g/dL (ref 30.0–36.0)
MCV: 100.5 fL — ABNORMAL HIGH (ref 80.0–100.0)
Monocytes Absolute: 0.3 10*3/uL (ref 0.1–1.0)
Monocytes Relative: 7 %
Neutro Abs: 2.9 10*3/uL (ref 1.7–7.7)
Neutrophils Relative %: 63 %
Platelet Count: 131 10*3/uL — ABNORMAL LOW (ref 150–400)
RBC: 3.8 MIL/uL — ABNORMAL LOW (ref 4.22–5.81)
RDW: 12.3 % (ref 11.5–15.5)
WBC Count: 4.6 10*3/uL (ref 4.0–10.5)
nRBC: 0 % (ref 0.0–0.2)

## 2022-02-15 MED ORDER — PROCHLORPERAZINE MALEATE 10 MG PO TABS: 10.0000 mg | ORAL_TABLET | Freq: Once | ORAL | Status: DC

## 2022-02-15 MED ORDER — BORTEZOMIB CHEMO SQ INJECTION 3.5 MG (2.5MG/ML)
1.3000 mg/m2 | Freq: Once | INTRAMUSCULAR | Status: AC
  Administered 2022-02-15: 2.75 mg via SUBCUTANEOUS
  Filled 2022-02-15: qty 1.1

## 2022-02-15 NOTE — Progress Notes (Signed)
Hematology and Oncology Follow Up Visit  Tyler Phillips [id number] [birth year] 56 y.o. 02/15/2022   Principle Diagnosis:  IgG Kappa myeloma - +4, +14, +17   Past Therapy: Status post autologous stem cell transplant on 05/22/2015   S/p cycle 6 of RVD Ninlaro 4 mg po q 2 week -- start on 01/02/2019 -- d/c on 02/10/2019   Current Therapy:        Velcade q 2wk dosing Revlimid '10mg'$  po q day (21/7)  - d/c on 12/07/2021   Interim History:  [name] is here today for follow-up and treatment. He continues to do well and has no complaints at this time.  Last month, No M-spike was observed, kappa light chains 2.84 mg/dL and IgG level 1,469 mg/dL.  No fever, chills, n/v, cough, rash, dizziness, SOB, chest pain, palpitations, abdominal pain or changes in bowel or bladder habits.  No blood loss bruising or petechiae.  No swelling in his extremities.  Neuropathy in his lower extremities unchanged from baseline.  No falls or syncope.  Appetite and hydration are good. Weight is stable at 214 lbs.    ECOG Performance Status: 1 - Symptomatic but completely ambulatory  Medications:  Allergies as of 02/15/2022   No Known Allergies      Medication List        Accurate as of February 15, 2022  9:16 AM. If you have any questions, ask your nurse or doctor.          aspirin 325 MG tablet Take 325 mg by mouth daily.   azithromycin 500 MG tablet Commonly known as: ZITHROMAX Take 500 mg by mouth daily as needed (For pre dental appointments).   bortezomib IV 3.5 MG injection Commonly known as: VELCADE Inject 1 mg/m2 into the vein every 14 (fourteen) days.   cetirizine 10 MG tablet Commonly known as: ZYRTEC Take 10 mg by mouth daily.   multivitamin with minerals Tabs tablet Take 1 tablet by mouth daily.   ondansetron 4 MG tablet Commonly known as: ZOFRAN Take 1 tablet (4 mg total) by mouth every 6 (six) hours as needed for nausea.   valACYclovir 500 MG tablet Commonly known  as: VALTREX Take 500 mg by mouth 2 (two) times daily.   zolpidem 10 MG tablet Commonly known as: AMBIEN Take 1 tablet (10 mg total) by mouth at bedtime as needed for sleep.        Allergies: No Known Allergies  Past Medical History, Surgical history, Social history, and Family History were reviewed and updated.  Review of Systems: All other 10 point review of systems is negative.   Physical Exam:  weight is 214 lb (97.1 kg). His oral temperature is 97.8 F (36.6 C). His blood pressure is 128/87 and his pulse is 62. His respiration is 18 and oxygen saturation is 99%.   Wt Readings from Last 3 Encounters:  02/15/22 214 lb (97.1 kg)  02/01/22 210 lb (95.3 kg)  01/01/22 205 lb 11.2 oz (93.3 kg)    Ocular: Sclerae unicteric, pupils equal, round and reactive to light Ear-nose-throat: Oropharynx clear, dentition fair Lymphatic: No cervical or supraclavicular adenopathy Lungs no rales or rhonchi, good excursion bilaterally Heart regular rate and rhythm, no murmur appreciated Abd soft, nontender, positive bowel sounds MSK no focal spinal tenderness, no joint edema Neuro: non-focal, well-oriented, appropriate affect Breasts: Deferred   Lab Results  Component Value Date   WBC 4.6 02/15/2022   HGB 13.4 02/15/2022   HCT 38.2 (L) 02/15/2022  MCV 100.5 (H) 02/15/2022   PLT 131 (L) 02/15/2022   Lab Results  Component Value Date   FERRITIN 1,263 (H) 07/20/2014   IRON 125 07/20/2014   TIBC 198 (L) 07/20/2014   UIBC 73 (L) 07/20/2014   IRONPCTSAT 63 (H) 07/20/2014   Lab Results  Component Value Date   RETICCTPCT 0.8 07/20/2014   RBC 3.80 (L) 02/15/2022   Lab Results  Component Value Date   KPAFRELGTCHN 28.4 (H) 02/01/2022   LAMBDASER 18.3 02/01/2022   KAPLAMBRATIO 1.55 02/01/2022   Lab Results  Component Value Date   IGGSERUM 1,469 02/01/2022   IGA 200 02/01/2022   IGMSERUM 25 02/01/2022   Lab Results  Component Value Date   TOTALPROTELP 6.9 12/18/2021    ALBUMINELP 3.8 12/18/2021   A1GS 0.2 12/18/2021   A2GS 0.7 12/18/2021   BETS 0.8 12/18/2021   BETA2SER 0.3 03/28/2015   GAMS 1.4 12/18/2021   MSPIKE Not Observed 12/18/2021   SPEI Comment 12/07/2021     Chemistry      Component Value Date/Time   NA 137 02/01/2022 0818   NA 140 04/22/2017 1140   NA 138 09/03/2015 1042   K 4.2 02/01/2022 0818   K 4.0 04/22/2017 1140   K 4.3 09/03/2015 1042   CL 103 02/01/2022 0818   CL 105 04/22/2017 1140   CO2 27 02/01/2022 0818   CO2 24 04/22/2017 1140   CO2 25 09/03/2015 1042   BUN 25 (H) 02/01/2022 0818   BUN 16 04/22/2017 1140   BUN 21.7 09/03/2015 1042   CREATININE 1.57 (H) 02/01/2022 0818   CREATININE 1.6 (H) 04/22/2017 1140   CREATININE 1.2 09/03/2015 1042      Component Value Date/Time   CALCIUM 9.9 02/01/2022 0818   CALCIUM 8.5 04/22/2017 1140   CALCIUM 9.1 09/03/2015 1042   ALKPHOS 57 02/01/2022 0818   ALKPHOS 59 04/22/2017 1140   ALKPHOS 42 09/03/2015 1042   AST 27 02/01/2022 0818   AST 27 09/03/2015 1042   ALT 39 02/01/2022 0818   ALT 45 04/22/2017 1140   ALT 41 09/03/2015 1042   BILITOT 0.7 02/01/2022 0818   BILITOT 0.54 09/03/2015 1042       Impression and Plan: [name] is a very pleasant 56 yo caucasian gentleman with IgG kappa myeloma. He underwent induction chemotherapy with RVD followed by an autologous stem cell transplant at [facility] on February 2017.  Revlemid d/c'd 12/07/2021.  He continues to do well and protein studies have remained stable.  We will proceed with Velcade today as planned.  Lab and treatment every 2 weeks. Follow-up in 4 weeks.   Tyler Dawson, NP 10/30/20239:16 AM

## 2022-02-15 NOTE — Telephone Encounter (Signed)
Per 02/15/22 los - called and lvm of upcoming appointments - requested callback to confirm.

## 2022-02-15 NOTE — Patient Instructions (Signed)
[facility]  Discharge Instructions: Thank you for choosing [facility] to provide your oncology and hematology care.   If you have a lab appointment with the [facility], please go directly to the [facility] and check in at the registration area.  Wear comfortable clothing and clothing appropriate for easy access to any Portacath or PICC line.   We strive to give you quality time with your provider. You may need to reschedule your appointment if you arrive late (15 or more minutes).  Arriving late affects you and other patients whose appointments are after yours.  Also, if you miss three or more appointments without notifying the office, you may be dismissed from the clinic at the provider's discretion.      For prescription refill requests, have your pharmacy contact our office and allow 72 hours for refills to be completed.    Today you received the following chemotherapy and/or immunotherapy agents Velcade3      To help prevent nausea and vomiting after your treatment, we encourage you to take your nausea medication as directed.  BELOW ARE SYMPTOMS THAT SHOULD BE REPORTED IMMEDIATELY: *FEVER GREATER THAN 100.4 F (38 C) OR HIGHER *CHILLS OR SWEATING *NAUSEA AND VOMITING THAT IS NOT CONTROLLED WITH YOUR NAUSEA MEDICATION *UNUSUAL SHORTNESS OF BREATH *UNUSUAL BRUISING OR BLEEDING *URINARY PROBLEMS (pain or burning when urinating, or frequent urination) *BOWEL PROBLEMS (unusual diarrhea, constipation, pain near the anus) TENDERNESS IN MOUTH AND THROAT WITH OR WITHOUT PRESENCE OF ULCERS (sore throat, sores in mouth, or a toothache) UNUSUAL RASH, SWELLING OR PAIN  UNUSUAL VAGINAL DISCHARGE OR ITCHING   Items with * indicate a potential emergency and should be followed up as soon as possible or go to the Emergency Department if any problems should occur.  Please show the CHEMOTHERAPY ALERT CARD or IMMUNOTHERAPY ALERT CARD at check-in to the  Emergency Department and triage nurse. Should you have questions after your visit or need to cancel or reschedule your appointment, please contact [facility]  [phone] and follow the prompts.  Office hours are 8:00 a.m. to 4:30 p.m. Monday - Friday. Please note that voicemails left after 4:00 p.m. may not be returned until the following business day.  We are closed weekends and major holidays. You have access to a nurse at all times for urgent questions. Please call the main number to the clinic [phone] and follow the prompts.  For any non-urgent questions, you may also contact your provider using MyChart. We now offer e-Visits for anyone 69 and older to request care online for non-urgent symptoms. For details visit [email or web address].   Also download the MyChart app! Go to the app store, search "MyChart", open the app, select [facility], and log in with your MyChart username and password.  Masks are optional in the cancer centers. If you would like for your care team to wear a mask while they are taking care of you, please let them know. You may have one support person who is at least 57 years old accompany you for your appointments.

## 2022-02-16 ENCOUNTER — Other Ambulatory Visit: Payer: Self-pay

## 2022-02-19 ENCOUNTER — Other Ambulatory Visit: Payer: Self-pay

## 2022-02-23 ENCOUNTER — Other Ambulatory Visit: Payer: Self-pay

## 2022-03-01 ENCOUNTER — Other Ambulatory Visit: Payer: Medicare Other

## 2022-03-01 ENCOUNTER — Ambulatory Visit: Payer: Medicare Other

## 2022-03-04 ENCOUNTER — Inpatient Hospital Stay: Payer: Medicare Other

## 2022-03-04 ENCOUNTER — Ambulatory Visit: Payer: Medicare Other

## 2022-03-04 ENCOUNTER — Other Ambulatory Visit: Payer: Medicare Other

## 2022-03-04 ENCOUNTER — Inpatient Hospital Stay: Payer: Medicare Other | Attending: Hematology & Oncology

## 2022-03-04 VITALS — BP 135/89 | HR 55 | Temp 98.0°F | Resp 17

## 2022-03-04 DIAGNOSIS — C9001 Multiple myeloma in remission: Secondary | ICD-10-CM | POA: Diagnosis not present

## 2022-03-04 DIAGNOSIS — Z5112 Encounter for antineoplastic immunotherapy: Secondary | ICD-10-CM | POA: Insufficient documentation

## 2022-03-04 LAB — CBC WITH DIFFERENTIAL (CANCER CENTER ONLY)
Abs Immature Granulocytes: 0.01 10*3/uL (ref 0.00–0.07)
Basophils Absolute: 0 10*3/uL (ref 0.0–0.1)
Basophils Relative: 0 %
Eosinophils Absolute: 0 10*3/uL (ref 0.0–0.5)
Eosinophils Relative: 0 %
HCT: 38.9 % — ABNORMAL LOW (ref 39.0–52.0)
Hemoglobin: 13.7 g/dL (ref 13.0–17.0)
Immature Granulocytes: 0 %
Lymphocytes Relative: 32 %
Lymphs Abs: 1.5 10*3/uL (ref 0.7–4.0)
MCH: 35.5 pg — ABNORMAL HIGH (ref 26.0–34.0)
MCHC: 35.2 g/dL (ref 30.0–36.0)
MCV: 100.8 fL — ABNORMAL HIGH (ref 80.0–100.0)
Monocytes Absolute: 0.4 10*3/uL (ref 0.1–1.0)
Monocytes Relative: 7 %
Neutro Abs: 2.9 10*3/uL (ref 1.7–7.7)
Neutrophils Relative %: 61 %
Platelet Count: 151 10*3/uL (ref 150–400)
RBC: 3.86 MIL/uL — ABNORMAL LOW (ref 4.22–5.81)
RDW: 12.4 % (ref 11.5–15.5)
WBC Count: 4.8 10*3/uL (ref 4.0–10.5)
nRBC: 0 % (ref 0.0–0.2)

## 2022-03-04 LAB — CMP (CANCER CENTER ONLY)
ALT: 44 U/L (ref 0–44)
AST: 23 U/L (ref 15–41)
Albumin: 4.4 g/dL (ref 3.5–5.0)
Alkaline Phosphatase: 53 U/L (ref 38–126)
Anion gap: 7 (ref 5–15)
BUN: 17 mg/dL (ref 6–20)
CO2: 27 mmol/L (ref 22–32)
Calcium: 9.7 mg/dL (ref 8.9–10.3)
Chloride: 104 mmol/L (ref 98–111)
Creatinine: 1.39 mg/dL — ABNORMAL HIGH (ref 0.61–1.24)
GFR, Estimated: 59 mL/min — ABNORMAL LOW (ref >60)
Glucose, Bld: 89 mg/dL (ref 70–99)
Potassium: 4.2 mmol/L (ref 3.5–5.1)
Sodium: 138 mmol/L (ref 135–145)
Total Bilirubin: 0.6 mg/dL (ref 0.3–1.2)
Total Protein: 7.3 g/dL (ref 6.5–8.1)

## 2022-03-04 MED ORDER — BORTEZOMIB CHEMO SQ INJECTION 3.5 MG (2.5MG/ML)
1.3000 mg/m2 | Freq: Once | INTRAMUSCULAR | Status: AC
  Administered 2022-03-04: 2.75 mg via SUBCUTANEOUS
  Filled 2022-03-04: qty 1.1

## 2022-03-04 NOTE — Patient Instructions (Signed)
[facility]  Discharge Instructions: Thank you for choosing [facility] to provide your oncology and hematology care.   If you have a lab appointment with the [facility], please go directly to the [facility] and check in at the registration area.  Wear comfortable clothing and clothing appropriate for easy access to any Portacath or PICC line.   We strive to give you quality time with your provider. You may need to reschedule your appointment if you arrive late (15 or more minutes).  Arriving late affects you and other patients whose appointments are after yours.  Also, if you miss three or more appointments without notifying the office, you may be dismissed from the clinic at the provider's discretion.      For prescription refill requests, have your pharmacy contact our office and allow 72 hours for refills to be completed.    Today you received the following chemotherapy and/or immunotherapy agents Velcade      To help prevent nausea and vomiting after your treatment, we encourage you to take your nausea medication as directed.  BELOW ARE SYMPTOMS THAT SHOULD BE REPORTED IMMEDIATELY: *FEVER GREATER THAN 100.4 F (38 C) OR HIGHER *CHILLS OR SWEATING *NAUSEA AND VOMITING THAT IS NOT CONTROLLED WITH YOUR NAUSEA MEDICATION *UNUSUAL SHORTNESS OF BREATH *UNUSUAL BRUISING OR BLEEDING *URINARY PROBLEMS (pain or burning when urinating, or frequent urination) *BOWEL PROBLEMS (unusual diarrhea, constipation, pain near the anus) TENDERNESS IN MOUTH AND THROAT WITH OR WITHOUT PRESENCE OF ULCERS (sore throat, sores in mouth, or a toothache) UNUSUAL RASH, SWELLING OR PAIN  UNUSUAL VAGINAL DISCHARGE OR ITCHING   Items with * indicate a potential emergency and should be followed up as soon as possible or go to the Emergency Department if any problems should occur.  Please show the CHEMOTHERAPY ALERT CARD or IMMUNOTHERAPY ALERT CARD at check-in to the  Emergency Department and triage nurse. Should you have questions after your visit or need to cancel or reschedule your appointment, please contact [facility]  [phone] and follow the prompts.  Office hours are 8:00 a.m. to 4:30 p.m. Monday - Friday. Please note that voicemails left after 4:00 p.m. may not be returned until the following business day.  We are closed weekends and major holidays. You have access to a nurse at all times for urgent questions. Please call the main number to the clinic [phone] and follow the prompts.  For any non-urgent questions, you may also contact your provider using MyChart. We now offer e-Visits for anyone 33 and older to request care online for non-urgent symptoms. For details visit [email or web address].   Also download the MyChart app! Go to the app store, search "MyChart", open the app, select [facility], and log in with your MyChart username and password.  Masks are optional in the cancer centers. If you would like for your care team to wear a mask while they are taking care of you, please let them know. You may have one support person who is at least 57 years old accompany you for your appointments.

## 2022-03-15 ENCOUNTER — Other Ambulatory Visit: Payer: Medicare Other

## 2022-03-15 ENCOUNTER — Ambulatory Visit: Payer: Medicare Other

## 2022-03-15 ENCOUNTER — Ambulatory Visit: Payer: Medicare Other | Admitting: Family

## 2022-03-19 ENCOUNTER — Inpatient Hospital Stay: Payer: Medicare Other | Attending: Hematology & Oncology

## 2022-03-19 ENCOUNTER — Inpatient Hospital Stay: Payer: Medicare Other

## 2022-03-19 ENCOUNTER — Encounter: Payer: Self-pay | Admitting: Family

## 2022-03-19 ENCOUNTER — Inpatient Hospital Stay (HOSPITAL_BASED_OUTPATIENT_CLINIC_OR_DEPARTMENT_OTHER): Payer: Medicare Other | Admitting: Family

## 2022-03-19 VITALS — BP 140/90 | HR 71 | Temp 97.9°F | Resp 18 | Ht 73.0 in | Wt 217.0 lb

## 2022-03-19 DIAGNOSIS — C9000 Multiple myeloma not having achieved remission: Secondary | ICD-10-CM

## 2022-03-19 DIAGNOSIS — C9001 Multiple myeloma in remission: Secondary | ICD-10-CM

## 2022-03-19 DIAGNOSIS — Z5112 Encounter for antineoplastic immunotherapy: Secondary | ICD-10-CM | POA: Diagnosis not present

## 2022-03-19 LAB — CBC WITH DIFFERENTIAL (CANCER CENTER ONLY)
Abs Immature Granulocytes: 0.01 10*3/uL (ref 0.00–0.07)
Basophils Absolute: 0 10*3/uL (ref 0.0–0.1)
Basophils Relative: 0 %
Eosinophils Absolute: 0 10*3/uL (ref 0.0–0.5)
Eosinophils Relative: 0 %
HCT: 40 % (ref 39.0–52.0)
Hemoglobin: 14.3 g/dL (ref 13.0–17.0)
Immature Granulocytes: 0 %
Lymphocytes Relative: 33 %
Lymphs Abs: 1.7 10*3/uL (ref 0.7–4.0)
MCH: 35.8 pg — ABNORMAL HIGH (ref 26.0–34.0)
MCHC: 35.8 g/dL (ref 30.0–36.0)
MCV: 100 fL (ref 80.0–100.0)
Monocytes Absolute: 0.4 10*3/uL (ref 0.1–1.0)
Monocytes Relative: 7 %
Neutro Abs: 3.2 10*3/uL (ref 1.7–7.7)
Neutrophils Relative %: 60 %
Platelet Count: 160 10*3/uL (ref 150–400)
RBC: 4 MIL/uL — ABNORMAL LOW (ref 4.22–5.81)
RDW: 12.1 % (ref 11.5–15.5)
WBC Count: 5.3 10*3/uL (ref 4.0–10.5)
nRBC: 0 % (ref 0.0–0.2)

## 2022-03-19 LAB — CMP (CANCER CENTER ONLY)
ALT: 52 U/L — ABNORMAL HIGH (ref 0–44)
AST: 44 U/L — ABNORMAL HIGH (ref 15–41)
Albumin: 4.9 g/dL (ref 3.5–5.0)
Alkaline Phosphatase: 57 U/L (ref 38–126)
Anion gap: 9 (ref 5–15)
BUN: 17 mg/dL (ref 6–20)
CO2: 26 mmol/L (ref 22–32)
Calcium: 9.5 mg/dL (ref 8.9–10.3)
Chloride: 103 mmol/L (ref 98–111)
Creatinine: 1.46 mg/dL — ABNORMAL HIGH (ref 0.61–1.24)
GFR, Estimated: 56 mL/min — ABNORMAL LOW (ref >60)
Glucose, Bld: 80 mg/dL (ref 70–99)
Potassium: 4 mmol/L (ref 3.5–5.1)
Sodium: 138 mmol/L (ref 135–145)
Total Bilirubin: 0.6 mg/dL (ref 0.3–1.2)
Total Protein: 7.9 g/dL (ref 6.5–8.1)

## 2022-03-19 LAB — LACTATE DEHYDROGENASE: LDH: 189 U/L (ref 98–192)

## 2022-03-19 MED ORDER — BORTEZOMIB CHEMO SQ INJECTION 3.5 MG (2.5MG/ML)
1.3000 mg/m2 | Freq: Once | INTRAMUSCULAR | Status: AC
  Administered 2022-03-19: 2.75 mg via SUBCUTANEOUS
  Filled 2022-03-19: qty 1.1

## 2022-03-19 MED ORDER — PROCHLORPERAZINE MALEATE 10 MG PO TABS: 10.0000 mg | ORAL_TABLET | Freq: Once | ORAL | Status: DC

## 2022-03-19 NOTE — Addendum Note (Signed)
Addended by: Burney Gauze R on: 03/19/2022 02:16 PM   Modules accepted: Orders

## 2022-03-19 NOTE — Progress Notes (Signed)
Hematology and Oncology Follow Up Visit  Tyler Phillips [id number] 01/25/1966 56 y.o. 03/19/2022   Principle Diagnosis:  IgG Kappa myeloma - +4, +14, +17   Past Therapy: Status post autologous stem cell transplant on 05/22/2015   S/p cycle 6 of RVD Ninlaro 4 mg po q 2 week -- start on 01/02/2019 -- d/c on 02/10/2019   Current Therapy:        Velcade q 2wk dosing Revlimid '10mg'$  po q day (21/7)  - d/c on 12/07/2021   Interim History:  [name] is here today for follow-up and treatment. He is doing well but notes some fatigue at times.  He has been working out regularly. High impact work out tends to cause some pain in the toes of the left Phillips.  Neuropathy in his lower extremities is unchanged from baseline.  No blood loss, bruising or petechiae noted.  No M-spike not detected in September. October IgG level was 1,469 mg/dL and Kappa light chains 28.4 mg/L.  No fever, chills, n/v, cough, rash, dizziness, SOB, chest pain, palpitations, abdominal pain or changes in bowel or bladder habits.  No swelling in his extremities.  No falls or syncope reported.  Appetite and hydration are good. Weight is stable at 217 lbs.   ECOG Performance Status: 1 - Symptomatic but completely ambulatory  Medications:  Allergies as of 03/19/2022   No Known Allergies      Medication List        Accurate as of March 19, 2022  1:43 PM. If you have any questions, ask your nurse or doctor.          aspirin 325 MG tablet Take 325 mg by mouth daily.   azithromycin 500 MG tablet Commonly known as: ZITHROMAX Take 500 mg by mouth daily as needed (For pre dental appointments).   bortezomib IV 3.5 MG injection Commonly known as: VELCADE Inject 1 mg/m2 into the vein every 14 (fourteen) days.   cetirizine 10 MG tablet Commonly known as: ZYRTEC Take 10 mg by mouth daily.   multivitamin with minerals Tabs tablet Take 1 tablet by mouth daily.   ondansetron 4 MG tablet Commonly known as:  ZOFRAN Take 1 tablet (4 mg total) by mouth every 6 (six) hours as needed for nausea.   valACYclovir 500 MG tablet Commonly known as: VALTREX Take 500 mg by mouth 2 (two) times daily.   zolpidem 10 MG tablet Commonly known as: AMBIEN Take 1 tablet (10 mg total) by mouth at bedtime as needed for sleep.        Allergies: No Known Allergies  Past Medical History, Surgical history, Social history, and Family History were reviewed and updated.  Review of Systems: All other 10 point review of systems is negative.   Physical Exam:  height is '6\' 1"'$  (1.854 m) and weight is 217 lb (98.4 kg). His oral temperature is 97.9 F (36.6 C). His blood pressure is 140/90 (abnormal) and his pulse is 71. His respiration is 18 and oxygen saturation is 99%.   Wt Readings from Last 3 Encounters:  03/19/22 217 lb (98.4 kg)  02/15/22 214 lb (97.1 kg)  02/01/22 210 lb (95.3 kg)    Ocular: Sclerae unicteric, pupils equal, round and reactive to light Ear-nose-throat: Oropharynx clear, dentition fair Lymphatic: No cervical or supraclavicular adenopathy Lungs no rales or rhonchi, good excursion bilaterally Heart regular rate and rhythm, no murmur appreciated Abd soft, nontender, positive bowel sounds MSK no focal spinal tenderness, no joint edema Neuro: non-focal,  well-oriented, appropriate affect Breasts: Deferred   Lab Results  Component Value Date   WBC 5.3 03/19/2022   HGB 14.3 03/19/2022   HCT 40.0 03/19/2022   MCV 100.0 03/19/2022   PLT 160 03/19/2022   Lab Results  Component Value Date   FERRITIN 1,263 (H) 07/20/2014   IRON 125 07/20/2014   TIBC 198 (L) 07/20/2014   UIBC 73 (L) 07/20/2014   IRONPCTSAT 63 (H) 07/20/2014   Lab Results  Component Value Date   RETICCTPCT 0.8 07/20/2014   RBC 4.00 (L) 03/19/2022   Lab Results  Component Value Date   KPAFRELGTCHN 28.4 (H) 02/01/2022   LAMBDASER 18.3 02/01/2022   KAPLAMBRATIO 1.55 02/01/2022   Lab Results  Component Value Date    IGGSERUM 1,469 02/01/2022   IGA 200 02/01/2022   IGMSERUM 25 02/01/2022   Lab Results  Component Value Date   TOTALPROTELP 6.9 12/18/2021   ALBUMINELP 3.8 12/18/2021   A1GS 0.2 12/18/2021   A2GS 0.7 12/18/2021   BETS 0.8 12/18/2021   BETA2SER 0.3 03/28/2015   GAMS 1.4 12/18/2021   MSPIKE Not Observed 12/18/2021   SPEI Comment 12/07/2021     Chemistry      Component Value Date/Time   NA 138 03/19/2022 1304   NA 140 04/22/2017 1140   NA 138 09/03/2015 1042   K 4.0 03/19/2022 1304   K 4.0 04/22/2017 1140   K 4.3 09/03/2015 1042   CL 103 03/19/2022 1304   CL 105 04/22/2017 1140   CO2 26 03/19/2022 1304   CO2 24 04/22/2017 1140   CO2 25 09/03/2015 1042   BUN 17 03/19/2022 1304   BUN 16 04/22/2017 1140   BUN 21.7 09/03/2015 1042   CREATININE 1.46 (H) 03/19/2022 1304   CREATININE 1.6 (H) 04/22/2017 1140   CREATININE 1.2 09/03/2015 1042      Component Value Date/Time   CALCIUM 9.5 03/19/2022 1304   CALCIUM 8.5 04/22/2017 1140   CALCIUM 9.1 09/03/2015 1042   ALKPHOS 57 03/19/2022 1304   ALKPHOS 59 04/22/2017 1140   ALKPHOS 42 09/03/2015 1042   AST 44 (H) 03/19/2022 1304   AST 27 09/03/2015 1042   ALT 52 (H) 03/19/2022 1304   ALT 45 04/22/2017 1140   ALT 41 09/03/2015 1042   BILITOT 0.6 03/19/2022 1304   BILITOT 0.54 09/03/2015 1042       Impression and Plan: [name] is a very pleasant 56 yo caucasian gentleman with IgG kappa myeloma. He underwent induction chemotherapy with RVD followed by an autologous stem cell transplant at [facility] on February 2017.  Revlemid d/c'd 12/07/2021.  He continues to do well and protein studies have remained stable.  We will proceed with Velcade today as planned.  Lab and treatment every 2 weeks. Follow-up in 6 weeks.   Lottie Dawson, NP 12/1/20231:43 PM

## 2022-03-19 NOTE — Patient Instructions (Signed)
[facility]  Discharge Instructions: Thank you for choosing [facility] to provide your oncology and hematology care.   If you have a lab appointment with the [facility], please go directly to the [facility] and check in at the registration area.  Wear comfortable clothing and clothing appropriate for easy access to any Portacath or PICC line.   We strive to give you quality time with your provider. You may need to reschedule your appointment if you arrive late (15 or more minutes).  Arriving late affects you and other patients whose appointments are after yours.  Also, if you miss three or more appointments without notifying the office, you may be dismissed from the clinic at the provider's discretion.      For prescription refill requests, have your pharmacy contact our office and allow 72 hours for refills to be completed.    Today you received the following chemotherapy and/or immunotherapy agents Velcade      To help prevent nausea and vomiting after your treatment, we encourage you to take your nausea medication as directed.  BELOW ARE SYMPTOMS THAT SHOULD BE REPORTED IMMEDIATELY: *FEVER GREATER THAN 100.4 F (38 C) OR HIGHER *CHILLS OR SWEATING *NAUSEA AND VOMITING THAT IS NOT CONTROLLED WITH YOUR NAUSEA MEDICATION *UNUSUAL SHORTNESS OF BREATH *UNUSUAL BRUISING OR BLEEDING *URINARY PROBLEMS (pain or burning when urinating, or frequent urination) *BOWEL PROBLEMS (unusual diarrhea, constipation, pain near the anus) TENDERNESS IN MOUTH AND THROAT WITH OR WITHOUT PRESENCE OF ULCERS (sore throat, sores in mouth, or a toothache) UNUSUAL RASH, SWELLING OR PAIN  UNUSUAL VAGINAL DISCHARGE OR ITCHING   Items with * indicate a potential emergency and should be followed up as soon as possible or go to the Emergency Department if any problems should occur.  Please show the CHEMOTHERAPY ALERT CARD or IMMUNOTHERAPY ALERT CARD at check-in to the  Emergency Department and triage nurse. Should you have questions after your visit or need to cancel or reschedule your appointment, please contact [facility]  [phone] and follow the prompts.  Office hours are 8:00 a.m. to 4:30 p.m. Monday - Friday. Please note that voicemails left after 4:00 p.m. may not be returned until the following business day.  We are closed weekends and major holidays. You have access to a nurse at all times for urgent questions. Please call the main number to the clinic [phone] and follow the prompts.  For any non-urgent questions, you may also contact your provider using MyChart. We now offer e-Visits for anyone 45 and older to request care online for non-urgent symptoms. For details visit [email or web address].   Also download the MyChart app! Go to the app store, search "MyChart", open the app, select [facility], and log in with your MyChart username and password.  Masks are optional in the cancer centers. If you would like for your care team to wear a mask while they are taking care of you, please let them know. You may have one support person who is at least 57 years old accompany you for your appointments.

## 2022-03-21 LAB — IGG, IGA, IGM
IgA: 191 mg/dL (ref 90–386)
IgG (Immunoglobin G), Serum: 1412 mg/dL (ref 603–1613)
IgM (Immunoglobulin M), Srm: 28 mg/dL (ref 20–172)

## 2022-03-22 LAB — KAPPA/LAMBDA LIGHT CHAINS
Kappa free light chain: 25.9 mg/L — ABNORMAL HIGH (ref 3.3–19.4)
Kappa, lambda light chain ratio: 1.12 (ref 0.26–1.65)
Lambda free light chains: 23.2 mg/L (ref 5.7–26.3)

## 2022-03-23 ENCOUNTER — Other Ambulatory Visit: Payer: Self-pay

## 2022-03-23 LAB — PROTEIN ELECTROPHORESIS, SERUM
A/G Ratio: 1.5 (ref 0.7–1.7)
Albumin ELP: 4.3 g/dL (ref 2.9–4.4)
Alpha-1-Globulin: 0.1 g/dL (ref 0.0–0.4)
Alpha-2-Globulin: 0.6 g/dL (ref 0.4–1.0)
Beta Globulin: 0.8 g/dL (ref 0.7–1.3)
Gamma Globulin: 1.3 g/dL (ref 0.4–1.8)
Globulin, Total: 2.8 g/dL (ref 2.2–3.9)
Total Protein ELP: 7.1 g/dL (ref 6.0–8.5)

## 2022-03-28 ENCOUNTER — Other Ambulatory Visit: Payer: Self-pay

## 2022-03-29 ENCOUNTER — Encounter: Payer: Self-pay | Admitting: *Deleted

## 2022-03-29 ENCOUNTER — Other Ambulatory Visit: Payer: Self-pay

## 2022-03-29 ENCOUNTER — Telehealth: Payer: Self-pay | Admitting: *Deleted

## 2022-03-29 NOTE — Patient Outreach (Signed)
  Care Coordination   Initial Visit Note   03/29/2022 Name: Tyler Phillips. MRN: [id number] DOB: 12-Apr-1966  Tyler Phillips. is a 56 y.o. year old male who sees [name], MD for primary care. I spoke with  [name] by phone today.  What matters to the patients health and wellness today?  No needs    Goals Addressed               This Visit's Progress     COMPLETED: No needs (pt-stated)        Care Coordination Interventions: Reviewed medications with patient and discussed adherence with no needed refills Reviewed scheduled/upcoming provider appointments including sufficient transportation Assessed social determinant of health barriers Explained care management services involving social workers and pharmacy however informed pt that with his oncologist team of providers would have educators concerning his inquiries today concerning neuropathy and neurotoxicity due to his ongoing chemotherapy.   Educated pt on the terms along with possible symptoms encountered and offered to add education available to his MyChart (declined) but receptive to the verbal education provided today. No needs presented today however pt again very appreciative for the call today.          SDOH assessments and interventions completed:  Yes  SDOH Interventions Today    Flowsheet Row Most Recent Value  SDOH Interventions   Food Insecurity Interventions Intervention Not Indicated  Housing Interventions Intervention Not Indicated  Transportation Interventions Intervention Not Indicated  Utilities Interventions Intervention Not Indicated        Care Coordination Interventions:  Yes, provided   Follow up plan: No further intervention required.   Encounter Outcome:  Pt. Visit Completed   Raina Mina, RN Care Management Coordinator [facility] Office [phone]

## 2022-03-29 NOTE — Patient Instructions (Signed)
Visit Information  Thank you for taking time to visit with me today. Please don't hesitate to contact me if I can be of assistance to you.   Following are the goals we discussed today:   Goals Addressed               This Visit's Progress     COMPLETED: No needs (pt-stated)        Care Coordination Interventions: Reviewed medications with patient and discussed adherence with no needed refills Reviewed scheduled/upcoming provider appointments including sufficient transportation Assessed social determinant of health barriers Explained care management services involving social workers and pharmacy however informed pt that with his oncologist team of providers would have educators concerning his inquiries today concerning neuropathy and neurotoxicity due to his ongoing chemotherapy.   Educated pt on the terms along with possible symptoms encountered and offered to add education available to his MyChart (declined) but receptive to the verbal education provided today. No needs presented today however pt again very appreciative for the call today.          Please call the care guide team at [phone] if you need to cancel or reschedule your appointment.   If you are experiencing a Mental Health or [facility] or need someone to talk to, please call the Suicide and Crisis Lifeline: 988 call the [place] National Suicide Prevention Lifeline: 615 574 9099 or TTY: 431-190-1208 TTY (787)430-3946) to talk to a trained counselor call 1-800-273-TALK (toll free, 24 hour hotline)  Patient verbalizes understanding of instructions and care plan provided today and agrees to view in [facility]. Active MyChart status and patient understanding of how to access instructions and care plan via MyChart confirmed with patient.     No further follow up required: No needs    Raina Mina, RN Care Management Coordinator [facility] Office [phone]

## 2022-04-02 ENCOUNTER — Inpatient Hospital Stay: Payer: Medicare Other

## 2022-04-02 VITALS — BP 136/81 | HR 59 | Temp 97.9°F | Resp 18

## 2022-04-02 DIAGNOSIS — C9001 Multiple myeloma in remission: Secondary | ICD-10-CM

## 2022-04-02 DIAGNOSIS — Z5112 Encounter for antineoplastic immunotherapy: Secondary | ICD-10-CM | POA: Diagnosis not present

## 2022-04-02 DIAGNOSIS — C9000 Multiple myeloma not having achieved remission: Secondary | ICD-10-CM | POA: Diagnosis not present

## 2022-04-02 LAB — CBC WITH DIFFERENTIAL (CANCER CENTER ONLY)
Abs Immature Granulocytes: 0.01 10*3/uL (ref 0.00–0.07)
Basophils Absolute: 0 10*3/uL (ref 0.0–0.1)
Basophils Relative: 0 %
Eosinophils Absolute: 0 10*3/uL (ref 0.0–0.5)
Eosinophils Relative: 0 %
HCT: 38.4 % — ABNORMAL LOW (ref 39.0–52.0)
Hemoglobin: 13.8 g/dL (ref 13.0–17.0)
Immature Granulocytes: 0 %
Lymphocytes Relative: 24 %
Lymphs Abs: 1.2 10*3/uL (ref 0.7–4.0)
MCH: 35.7 pg — ABNORMAL HIGH (ref 26.0–34.0)
MCHC: 35.9 g/dL (ref 30.0–36.0)
MCV: 99.2 fL (ref 80.0–100.0)
Monocytes Absolute: 0.4 10*3/uL (ref 0.1–1.0)
Monocytes Relative: 8 %
Neutro Abs: 3.4 10*3/uL (ref 1.7–7.7)
Neutrophils Relative %: 68 %
Platelet Count: 146 10*3/uL — ABNORMAL LOW (ref 150–400)
RBC: 3.87 MIL/uL — ABNORMAL LOW (ref 4.22–5.81)
RDW: 11.9 % (ref 11.5–15.5)
WBC Count: 5.1 10*3/uL (ref 4.0–10.5)
nRBC: 0 % (ref 0.0–0.2)

## 2022-04-02 LAB — CMP (CANCER CENTER ONLY)
ALT: 43 U/L (ref 0–44)
AST: 26 U/L (ref 15–41)
Albumin: 4.5 g/dL (ref 3.5–5.0)
Alkaline Phosphatase: 49 U/L (ref 38–126)
Anion gap: 7 (ref 5–15)
BUN: 16 mg/dL (ref 6–20)
CO2: 27 mmol/L (ref 22–32)
Calcium: 9.2 mg/dL (ref 8.9–10.3)
Chloride: 103 mmol/L (ref 98–111)
Creatinine: 1.43 mg/dL — ABNORMAL HIGH (ref 0.61–1.24)
GFR, Estimated: 57 mL/min — ABNORMAL LOW (ref >60)
Glucose, Bld: 91 mg/dL (ref 70–99)
Potassium: 4.5 mmol/L (ref 3.5–5.1)
Sodium: 137 mmol/L (ref 135–145)
Total Bilirubin: 0.6 mg/dL (ref 0.3–1.2)
Total Protein: 7.1 g/dL (ref 6.5–8.1)

## 2022-04-02 MED ORDER — PROCHLORPERAZINE MALEATE 10 MG PO TABS: 10.0000 mg | ORAL_TABLET | Freq: Once | ORAL | Status: DC

## 2022-04-02 MED ORDER — BORTEZOMIB CHEMO SQ INJECTION 3.5 MG (2.5MG/ML)
1.3000 mg/m2 | Freq: Once | INTRAMUSCULAR | Status: AC
  Administered 2022-04-02: 2.75 mg via SUBCUTANEOUS
  Filled 2022-04-02: qty 1.1

## 2022-04-02 NOTE — Patient Instructions (Signed)
[facility]  Discharge Instructions: Thank you for choosing [facility] to provide your oncology and hematology care.   If you have a lab appointment with the [facility], please go directly to the [facility] and check in at the registration area.  Wear comfortable clothing and clothing appropriate for easy access to any Portacath or PICC line.   We strive to give you quality time with your provider. You may need to reschedule your appointment if you arrive late (15 or more minutes).  Arriving late affects you and other patients whose appointments are after yours.  Also, if you miss three or more appointments without notifying the office, you may be dismissed from the clinic at the provider's discretion.      For prescription refill requests, have your pharmacy contact our office and allow 72 hours for refills to be completed.    Today you received the following chemotherapy and/or immunotherapy agents Velcade.      To help prevent nausea and vomiting after your treatment, we encourage you to take your nausea medication as directed.  BELOW ARE SYMPTOMS THAT SHOULD BE REPORTED IMMEDIATELY: *FEVER GREATER THAN 100.4 F (38 C) OR HIGHER *CHILLS OR SWEATING *NAUSEA AND VOMITING THAT IS NOT CONTROLLED WITH YOUR NAUSEA MEDICATION *UNUSUAL SHORTNESS OF BREATH *UNUSUAL BRUISING OR BLEEDING *URINARY PROBLEMS (pain or burning when urinating, or frequent urination) *BOWEL PROBLEMS (unusual diarrhea, constipation, pain near the anus) TENDERNESS IN MOUTH AND THROAT WITH OR WITHOUT PRESENCE OF ULCERS (sore throat, sores in mouth, or a toothache) UNUSUAL RASH, SWELLING OR PAIN  UNUSUAL VAGINAL DISCHARGE OR ITCHING   Items with * indicate a potential emergency and should be followed up as soon as possible or go to the Emergency Department if any problems should occur.  Please show the CHEMOTHERAPY ALERT CARD or IMMUNOTHERAPY ALERT CARD at check-in to the  Emergency Department and triage nurse. Should you have questions after your visit or need to cancel or reschedule your appointment, please contact [facility]  [phone] and follow the prompts.  Office hours are 8:00 a.m. to 4:30 p.m. Monday - Friday. Please note that voicemails left after 4:00 p.m. may not be returned until the following business day.  We are closed weekends and major holidays. You have access to a nurse at all times for urgent questions. Please call the main number to the clinic [phone] and follow the prompts.  For any non-urgent questions, you may also contact your provider using MyChart. We now offer e-Visits for anyone 56 and older to request care online for non-urgent symptoms. For details visit [email or web address].   Also download the MyChart app! Go to the app store, search "MyChart", open the app, select [facility], and log in with your MyChart username and password.  Masks are optional in the cancer centers. If you would like for your care team to wear a mask while they are taking care of you, please let them know. You may have one support person who is at least 57 years old accompany you for your appointments.

## 2022-04-07 DIAGNOSIS — G629 Polyneuropathy, unspecified: Secondary | ICD-10-CM | POA: Diagnosis not present

## 2022-04-16 ENCOUNTER — Inpatient Hospital Stay: Payer: Medicare Other

## 2022-04-16 VITALS — BP 142/89 | HR 69 | Temp 98.0°F | Resp 17

## 2022-04-16 DIAGNOSIS — C9000 Multiple myeloma not having achieved remission: Secondary | ICD-10-CM | POA: Diagnosis not present

## 2022-04-16 DIAGNOSIS — C9001 Multiple myeloma in remission: Secondary | ICD-10-CM

## 2022-04-16 DIAGNOSIS — Z5112 Encounter for antineoplastic immunotherapy: Secondary | ICD-10-CM | POA: Diagnosis not present

## 2022-04-16 LAB — CMP (CANCER CENTER ONLY)
ALT: 37 U/L (ref 0–44)
AST: 24 U/L (ref 15–41)
Albumin: 4.6 g/dL (ref 3.5–5.0)
Alkaline Phosphatase: 55 U/L (ref 38–126)
Anion gap: 7 (ref 5–15)
BUN: 19 mg/dL (ref 6–20)
CO2: 26 mmol/L (ref 22–32)
Calcium: 8.9 mg/dL (ref 8.9–10.3)
Chloride: 105 mmol/L (ref 98–111)
Creatinine: 1.54 mg/dL — ABNORMAL HIGH (ref 0.61–1.24)
GFR, Estimated: 52 mL/min — ABNORMAL LOW (ref >60)
Glucose, Bld: 88 mg/dL (ref 70–99)
Potassium: 4.1 mmol/L (ref 3.5–5.1)
Sodium: 138 mmol/L (ref 135–145)
Total Bilirubin: 0.6 mg/dL (ref 0.3–1.2)
Total Protein: 7.1 g/dL (ref 6.5–8.1)

## 2022-04-16 LAB — CBC WITH DIFFERENTIAL (CANCER CENTER ONLY)
Abs Immature Granulocytes: 0.02 10*3/uL (ref 0.00–0.07)
Basophils Absolute: 0 10*3/uL (ref 0.0–0.1)
Basophils Relative: 0 %
Eosinophils Absolute: 0 10*3/uL (ref 0.0–0.5)
Eosinophils Relative: 1 %
HCT: 39.3 % (ref 39.0–52.0)
Hemoglobin: 14.1 g/dL (ref 13.0–17.0)
Immature Granulocytes: 0 %
Lymphocytes Relative: 32 %
Lymphs Abs: 1.5 10*3/uL (ref 0.7–4.0)
MCH: 36.2 pg — ABNORMAL HIGH (ref 26.0–34.0)
MCHC: 35.9 g/dL (ref 30.0–36.0)
MCV: 101 fL — ABNORMAL HIGH (ref 80.0–100.0)
Monocytes Absolute: 0.4 10*3/uL (ref 0.1–1.0)
Monocytes Relative: 8 %
Neutro Abs: 2.9 10*3/uL (ref 1.7–7.7)
Neutrophils Relative %: 59 %
Platelet Count: 154 10*3/uL (ref 150–400)
RBC: 3.89 MIL/uL — ABNORMAL LOW (ref 4.22–5.81)
RDW: 12.1 % (ref 11.5–15.5)
WBC Count: 4.9 10*3/uL (ref 4.0–10.5)
nRBC: 0 % (ref 0.0–0.2)

## 2022-04-16 LAB — LACTATE DEHYDROGENASE: LDH: 135 U/L (ref 98–192)

## 2022-04-16 MED ORDER — BORTEZOMIB CHEMO SQ INJECTION 3.5 MG (2.5MG/ML)
1.3000 mg/m2 | Freq: Once | INTRAMUSCULAR | Status: AC
  Administered 2022-04-16: 2.75 mg via SUBCUTANEOUS
  Filled 2022-04-16: qty 1.1

## 2022-04-16 NOTE — Patient Instructions (Signed)
[facility]  Discharge Instructions: Thank you for choosing [facility] to provide your oncology and hematology care.   If you have a lab appointment with the [facility], please go directly to the [facility] and check in at the registration area.  Wear comfortable clothing and clothing appropriate for easy access to any Portacath or PICC line.   We strive to give you quality time with your provider. You may need to reschedule your appointment if you arrive late (15 or more minutes).  Arriving late affects you and other patients whose appointments are after yours.  Also, if you miss three or more appointments without notifying the office, you may be dismissed from the clinic at the provider's discretion.      For prescription refill requests, have your pharmacy contact our office and allow 72 hours for refills to be completed.    Today you received the following chemotherapy and/or immunotherapy agents Velcade      To help prevent nausea and vomiting after your treatment, we encourage you to take your nausea medication as directed.  BELOW ARE SYMPTOMS THAT SHOULD BE REPORTED IMMEDIATELY: *FEVER GREATER THAN 100.4 F (38 C) OR HIGHER *CHILLS OR SWEATING *NAUSEA AND VOMITING THAT IS NOT CONTROLLED WITH YOUR NAUSEA MEDICATION *UNUSUAL SHORTNESS OF BREATH *UNUSUAL BRUISING OR BLEEDING *URINARY PROBLEMS (pain or burning when urinating, or frequent urination) *BOWEL PROBLEMS (unusual diarrhea, constipation, pain near the anus) TENDERNESS IN MOUTH AND THROAT WITH OR WITHOUT PRESENCE OF ULCERS (sore throat, sores in mouth, or a toothache) UNUSUAL RASH, SWELLING OR PAIN  UNUSUAL VAGINAL DISCHARGE OR ITCHING   Items with * indicate a potential emergency and should be followed up as soon as possible or go to the Emergency Department if any problems should occur.  Please show the CHEMOTHERAPY ALERT CARD or IMMUNOTHERAPY ALERT CARD at check-in to the  Emergency Department and triage nurse. Should you have questions after your visit or need to cancel or reschedule your appointment, please contact [facility]  [phone] and follow the prompts.  Office hours are 8:00 a.m. to 4:30 p.m. Monday - Friday. Please note that voicemails left after 4:00 p.m. may not be returned until the following business day.  We are closed weekends and major holidays. You have access to a nurse at all times for urgent questions. Please call the main number to the clinic [phone] and follow the prompts.  For any non-urgent questions, you may also contact your provider using MyChart. We now offer e-Visits for anyone 35 and older to request care online for non-urgent symptoms. For details visit [email or web address].   Also download the MyChart app! Go to the app store, search "MyChart", open the app, select [facility], and log in with your MyChart username and password.

## 2022-04-16 NOTE — Progress Notes (Signed)
Ok to treat with creatinine of 1.54 per DR [name]. dph

## 2022-04-30 ENCOUNTER — Inpatient Hospital Stay: Payer: Medicare Other

## 2022-04-30 ENCOUNTER — Inpatient Hospital Stay: Payer: Medicare Other | Attending: Hematology & Oncology | Admitting: Hematology & Oncology

## 2022-04-30 ENCOUNTER — Encounter: Payer: Self-pay | Admitting: Hematology & Oncology

## 2022-04-30 VITALS — BP 141/92 | HR 61 | Temp 97.8°F | Resp 20 | Wt 215.1 lb

## 2022-04-30 DIAGNOSIS — C9000 Multiple myeloma not having achieved remission: Secondary | ICD-10-CM | POA: Diagnosis not present

## 2022-04-30 DIAGNOSIS — C9001 Multiple myeloma in remission: Secondary | ICD-10-CM

## 2022-04-30 DIAGNOSIS — Z5112 Encounter for antineoplastic immunotherapy: Secondary | ICD-10-CM | POA: Insufficient documentation

## 2022-04-30 LAB — CBC WITH DIFFERENTIAL (CANCER CENTER ONLY)
Abs Immature Granulocytes: 0.01 10*3/uL (ref 0.00–0.07)
Basophils Absolute: 0 10*3/uL (ref 0.0–0.1)
Basophils Relative: 0 %
Eosinophils Absolute: 0 10*3/uL (ref 0.0–0.5)
Eosinophils Relative: 0 %
HCT: 40.1 % (ref 39.0–52.0)
Hemoglobin: 14.2 g/dL (ref 13.0–17.0)
Immature Granulocytes: 0 %
Lymphocytes Relative: 30 %
Lymphs Abs: 1.4 10*3/uL (ref 0.7–4.0)
MCH: 35.3 pg — ABNORMAL HIGH (ref 26.0–34.0)
MCHC: 35.4 g/dL (ref 30.0–36.0)
MCV: 99.8 fL (ref 80.0–100.0)
Monocytes Absolute: 0.3 10*3/uL (ref 0.1–1.0)
Monocytes Relative: 7 %
Neutro Abs: 2.8 10*3/uL (ref 1.7–7.7)
Neutrophils Relative %: 63 %
Platelet Count: 174 10*3/uL (ref 150–400)
RBC: 4.02 MIL/uL — ABNORMAL LOW (ref 4.22–5.81)
RDW: 12 % (ref 11.5–15.5)
WBC Count: 4.5 10*3/uL (ref 4.0–10.5)
nRBC: 0 % (ref 0.0–0.2)

## 2022-04-30 LAB — CMP (CANCER CENTER ONLY)
ALT: 44 U/L (ref 0–44)
AST: 27 U/L (ref 15–41)
Albumin: 4.6 g/dL (ref 3.5–5.0)
Alkaline Phosphatase: 51 U/L (ref 38–126)
Anion gap: 10 (ref 5–15)
BUN: 17 mg/dL (ref 6–20)
CO2: 23 mmol/L (ref 22–32)
Calcium: 9.7 mg/dL (ref 8.9–10.3)
Chloride: 104 mmol/L (ref 98–111)
Creatinine: 1.5 mg/dL — ABNORMAL HIGH (ref 0.61–1.24)
GFR, Estimated: 54 mL/min — ABNORMAL LOW (ref >60)
Glucose, Bld: 101 mg/dL — ABNORMAL HIGH (ref 70–99)
Potassium: 4.3 mmol/L (ref 3.5–5.1)
Sodium: 137 mmol/L (ref 135–145)
Total Bilirubin: 0.7 mg/dL (ref 0.3–1.2)
Total Protein: 7.6 g/dL (ref 6.5–8.1)

## 2022-04-30 LAB — LACTATE DEHYDROGENASE: LDH: 144 U/L (ref 98–192)

## 2022-04-30 MED ORDER — PROCHLORPERAZINE MALEATE 10 MG PO TABS: 10.0000 mg | ORAL_TABLET | Freq: Once | ORAL | Status: DC

## 2022-04-30 MED ORDER — BORTEZOMIB CHEMO SQ INJECTION 3.5 MG (2.5MG/ML)
1.3000 mg/m2 | Freq: Once | INTRAMUSCULAR | Status: AC
  Administered 2022-04-30: 2.75 mg via SUBCUTANEOUS
  Filled 2022-04-30: qty 1.1

## 2022-04-30 NOTE — Progress Notes (Signed)
Hematology and Oncology Follow Up Visit  Tyler Phillips [id number] 1966-01-13 57 y.o. 04/30/2022   Principle Diagnosis:  IgG Kappa myeloma - +4, +14, +17   Past Therapy: Status post autologous stem cell transplant on 05/22/2015   S/p cycle 6 of RVD [place] 4 mg po q 2 week -- start on 01/02/2019 -- d/c on 02/10/2019   Current Therapy:        Velcade q 2wk dosing Revlimid '10mg'$  po q day (21/7)  - d/c on 12/07/2021   Interim History:  Tyler Phillips is here today for follow-up and treatment.  The big news is that he and his wife will be grandparents in March.  I am incredibly excited for him.  A son and daughter-in-law will be having a baby girl.  This will be in [place].  This is a huge event for them.  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  He feels well otherwise.  He had enjoy the holiday season.  His kids were all home.  He has done well with the Velcade.  Single agent Velcade has worked well for him.  There is no monoclonal spike in his blood.  His IgG level was 1412 mg/dL.  The Kappa light chain was 2.6 mg/dL.  He is still quite active.  He does a lot of work around the house informed of the [place] on.  He has had no fever.  He has had no bleeding.  There is no change in bowel or bladder habits.  He has had no leg swelling.  Overall, I would say his performance status is probably ECOG 0.    Medications:  Allergies as of 04/30/2022   No Known Allergies      Medication List        Accurate as of April 30, 2022  9:43 AM. If you have any questions, ask your nurse or doctor.          aspirin 325 MG tablet Take 325 mg by mouth daily.   azithromycin 500 MG tablet Commonly known as: ZITHROMAX Take 500 mg by mouth daily as needed (For pre dental appointments).   bortezomib IV 3.5 MG injection Commonly known as: VELCADE 1 mg/m2 every 14 (fourteen) days.   cetirizine 10 MG tablet Commonly  known as: ZYRTEC Take 10 mg by mouth daily.   multivitamin with minerals Tabs tablet Take 1 tablet by mouth daily.   ondansetron 4 MG tablet Commonly known as: ZOFRAN Take 1 tablet (4 mg total) by mouth every 6 (six) hours as needed for nausea.   valACYclovir 500 MG tablet Commonly known as: VALTREX Take 500 mg by mouth 2 (two) times daily.   zolpidem 10 MG tablet Commonly known as: AMBIEN Take 1 tablet (10 mg total) by mouth at bedtime as needed for sleep.        Allergies: No Known Allergies  Past Medical History, Surgical history, Social history, and Family History were reviewed and updated.  Review of Systems: Review of Systems  Constitutional: Negative.   HENT: Negative.    Eyes: Negative.   Respiratory: Negative.    Cardiovascular: Negative.   Gastrointestinal: Negative.   Genitourinary: Negative.   Musculoskeletal: Negative.   Skin: Negative.   Neurological: Negative.   Endo/Heme/Allergies: Negative.   Psychiatric/Behavioral: Negative.     [facility]   Physical Exam:  weight is 215 lb 1.9 oz (97.6 kg). His  oral temperature is 97.8 F (36.6 C). His blood pressure is 141/92 (abnormal) and his pulse is 61. His respiration is 20 and oxygen saturation is 99%.   Wt Readings from Last 3 Encounters:  04/30/22 215 lb 1.9 oz (97.6 kg)  03/19/22 217 lb (98.4 kg)  02/15/22 214 lb (97.1 kg)    Physical Exam Vitals reviewed.  HENT:     Head: Normocephalic and atraumatic.  Eyes:     Pupils: Pupils are equal, round, and reactive to light.  Cardiovascular:     Rate and Rhythm: Normal rate and regular rhythm.     Heart sounds: Normal heart sounds.  Pulmonary:     Effort: Pulmonary effort is normal.     Breath sounds: Normal breath sounds.  Abdominal:     General: Bowel sounds are normal.     Palpations: Abdomen is soft.  Musculoskeletal:        General: No tenderness or deformity. Normal range of motion.     Cervical back: Normal range of motion.  Lymphadenopathy:      Cervical: No cervical adenopathy.  Skin:    General: Skin is warm and dry.     Findings: No erythema or rash.  Neurological:     Mental Status: He is alert and oriented to person, place, and time.  Psychiatric:        Behavior: Behavior normal.        Thought Content: Thought content normal.        Judgment: Judgment normal.      Lab Results  Component Value Date   WBC 4.5 04/30/2022   HGB 14.2 04/30/2022   HCT 40.1 04/30/2022   MCV 99.8 04/30/2022   PLT 174 04/30/2022   Lab Results  Component Value Date   FERRITIN 1,263 (H) 07/20/2014   IRON 125 07/20/2014   TIBC 198 (L) 07/20/2014   UIBC 73 (L) 07/20/2014   IRONPCTSAT 63 (H) 07/20/2014   Lab Results  Component Value Date   RETICCTPCT 0.8 07/20/2014   RBC 4.02 (L) 04/30/2022   Lab Results  Component Value Date   KPAFRELGTCHN 25.9 (H) 03/19/2022   LAMBDASER 23.2 03/19/2022   KAPLAMBRATIO 1.12 03/19/2022   Lab Results  Component Value Date   IGGSERUM 1,412 03/19/2022   IGA 191 03/19/2022   IGMSERUM 28 03/19/2022   Lab Results  Component Value Date   TOTALPROTELP 7.1 03/19/2022   ALBUMINELP 4.3 03/19/2022   A1GS 0.1 03/19/2022   A2GS 0.6 03/19/2022   BETS 0.8 03/19/2022   BETA2SER 0.3 03/28/2015   GAMS 1.3 03/19/2022   MSPIKE Not Observed 03/19/2022   SPEI Comment 03/19/2022     Chemistry      Component Value Date/Time   NA 137 04/30/2022 0837   NA 140 04/22/2017 1140   NA 138 09/03/2015 1042   K 4.3 04/30/2022 0837   K 4.0 04/22/2017 1140   K 4.3 09/03/2015 1042   CL 104 04/30/2022 0837   CL 105 04/22/2017 1140   CO2 23 04/30/2022 0837   CO2 24 04/22/2017 1140   CO2 25 09/03/2015 1042   BUN 17 04/30/2022 0837   BUN 16 04/22/2017 1140   BUN 21.7 09/03/2015 1042   CREATININE 1.50 (H) 04/30/2022 0837   CREATININE 1.6 (H) 04/22/2017 1140   CREATININE 1.2 09/03/2015 1042      Component Value Date/Time   CALCIUM 9.7 04/30/2022 0837   CALCIUM 8.5 04/22/2017 1140   CALCIUM 9.1 09/03/2015  1042   ALKPHOS  51 04/30/2022 0837   ALKPHOS 59 04/22/2017 1140   ALKPHOS 42 09/03/2015 1042   AST 27 04/30/2022 0837   AST 27 09/03/2015 1042   ALT 44 04/30/2022 0837   ALT 45 04/22/2017 1140   ALT 41 09/03/2015 1042   BILITOT 0.7 04/30/2022 0837   BILITOT 0.54 09/03/2015 1042       Impression and Plan: Tyler Phillips is a very pleasant 57 yo caucasian gentleman with IgG kappa myeloma. He underwent induction chemotherapy with RVD followed by an autologous stem cell transplant [facility] on February 2017.   He is done remarkably well.  We stopped the Revlimid back in August 2023 because of his blood counts being low.  I was worried about him developing myelodysplasia.  He really is doing nicely.  The every 2-week Velcade seems to be working well for him.  Maybe, at some point in the future, we will be able to move the Velcade every 3 weeks.  Again, I am just incredibly happy that he and his wife will be grandparents.  We will certainly have an adjustment with his schedule depending on the baby's birth.  I will plan to see him back in another 6 weeks.   [name], MD 1/12/20249:43 AM

## 2022-04-30 NOTE — Patient Instructions (Signed)
[facility]  Discharge Instructions: Thank you for choosing [facility] to provide your oncology and hematology care.   If you have a lab appointment with the [facility], please go directly to the [facility] and check in at the registration area.  Wear comfortable clothing and clothing appropriate for easy access to any Portacath or PICC line.   We strive to give you quality time with your provider. You may need to reschedule your appointment if you arrive late (15 or more minutes).  Arriving late affects you and other patients whose appointments are after yours.  Also, if you miss three or more appointments without notifying the office, you may be dismissed from the clinic at the provider's discretion.      For prescription refill requests, have your pharmacy contact our office and allow 72 hours for refills to be completed.    Today you received the following chemotherapy and/or immunotherapy agents Velcade.      To help prevent nausea and vomiting after your treatment, we encourage you to take your nausea medication as directed.  BELOW ARE SYMPTOMS THAT SHOULD BE REPORTED IMMEDIATELY: *FEVER GREATER THAN 100.4 F (38 C) OR HIGHER *CHILLS OR SWEATING *NAUSEA AND VOMITING THAT IS NOT CONTROLLED WITH YOUR NAUSEA MEDICATION *UNUSUAL SHORTNESS OF BREATH *UNUSUAL BRUISING OR BLEEDING *URINARY PROBLEMS (pain or burning when urinating, or frequent urination) *BOWEL PROBLEMS (unusual diarrhea, constipation, pain near the anus) TENDERNESS IN MOUTH AND THROAT WITH OR WITHOUT PRESENCE OF ULCERS (sore throat, sores in mouth, or a toothache) UNUSUAL RASH, SWELLING OR PAIN  UNUSUAL VAGINAL DISCHARGE OR ITCHING   Items with * indicate a potential emergency and should be followed up as soon as possible or go to the Emergency Department if any problems should occur.  Please show the CHEMOTHERAPY ALERT CARD or IMMUNOTHERAPY ALERT CARD at check-in to the  Emergency Department and triage nurse. Should you have questions after your visit or need to cancel or reschedule your appointment, please contact [facility]  [phone] and follow the prompts.  Office hours are 8:00 a.m. to 4:30 p.m. Monday - Friday. Please note that voicemails left after 4:00 p.m. may not be returned until the following business day.  We are closed weekends and major holidays. You have access to a nurse at all times for urgent questions. Please call the main number to the clinic [phone] and follow the prompts.  For any non-urgent questions, you may also contact your provider using MyChart. We now offer e-Visits for anyone 42 and older to request care online for non-urgent symptoms. For details visit [email or web address].   Also download the MyChart app! Go to the app store, search "MyChart", open the app, select , and log in with your MyChart username and password.

## 2022-05-02 LAB — IGG, IGA, IGM
IgA: 195 mg/dL (ref 90–386)
IgG (Immunoglobin G), Serum: 1467 mg/dL (ref 603–1613)
IgM (Immunoglobulin M), Srm: 26 mg/dL (ref 20–172)

## 2022-05-03 LAB — KAPPA/LAMBDA LIGHT CHAINS
Kappa free light chain: 26.2 mg/L — ABNORMAL HIGH (ref 3.3–19.4)
Kappa, lambda light chain ratio: 1.34 (ref 0.26–1.65)
Lambda free light chains: 19.5 mg/L (ref 5.7–26.3)

## 2022-05-04 LAB — PROTEIN ELECTROPHORESIS, SERUM
A/G Ratio: 1.3 (ref 0.7–1.7)
Albumin ELP: 3.9 g/dL (ref 2.9–4.4)
Alpha-1-Globulin: 0.2 g/dL (ref 0.0–0.4)
Alpha-2-Globulin: 0.6 g/dL (ref 0.4–1.0)
Beta Globulin: 0.9 g/dL (ref 0.7–1.3)
Gamma Globulin: 1.2 g/dL (ref 0.4–1.8)
Globulin, Total: 2.9 g/dL (ref 2.2–3.9)
Total Protein ELP: 6.8 g/dL (ref 6.0–8.5)

## 2022-05-07 ENCOUNTER — Other Ambulatory Visit: Payer: Self-pay

## 2022-05-09 ENCOUNTER — Other Ambulatory Visit: Payer: Self-pay

## 2022-05-14 ENCOUNTER — Inpatient Hospital Stay: Payer: Medicare Other

## 2022-05-14 VITALS — BP 132/98 | HR 75 | Temp 98.3°F | Resp 18

## 2022-05-14 DIAGNOSIS — C9000 Multiple myeloma not having achieved remission: Secondary | ICD-10-CM | POA: Diagnosis not present

## 2022-05-14 DIAGNOSIS — C9001 Multiple myeloma in remission: Secondary | ICD-10-CM

## 2022-05-14 DIAGNOSIS — Z5112 Encounter for antineoplastic immunotherapy: Secondary | ICD-10-CM | POA: Diagnosis not present

## 2022-05-14 LAB — CMP (CANCER CENTER ONLY)
ALT: 33 U/L (ref 0–44)
AST: 29 U/L (ref 15–41)
Albumin: 4.6 g/dL (ref 3.5–5.0)
Alkaline Phosphatase: 53 U/L (ref 38–126)
Anion gap: 6 (ref 5–15)
BUN: 20 mg/dL (ref 6–20)
CO2: 26 mmol/L (ref 22–32)
Calcium: 9.3 mg/dL (ref 8.9–10.3)
Chloride: 104 mmol/L (ref 98–111)
Creatinine: 1.37 mg/dL — ABNORMAL HIGH (ref 0.61–1.24)
GFR, Estimated: >60 mL/min (ref >60)
Glucose, Bld: 88 mg/dL (ref 70–99)
Potassium: 4.2 mmol/L (ref 3.5–5.1)
Sodium: 136 mmol/L (ref 135–145)
Total Bilirubin: 0.7 mg/dL (ref 0.3–1.2)
Total Protein: 7.3 g/dL (ref 6.5–8.1)

## 2022-05-14 LAB — CBC WITH DIFFERENTIAL (CANCER CENTER ONLY)
Abs Immature Granulocytes: 0.02 10*3/uL (ref 0.00–0.07)
Basophils Absolute: 0 10*3/uL (ref 0.0–0.1)
Basophils Relative: 0 %
Eosinophils Absolute: 0 10*3/uL (ref 0.0–0.5)
Eosinophils Relative: 1 %
HCT: 40.2 % (ref 39.0–52.0)
Hemoglobin: 14.1 g/dL (ref 13.0–17.0)
Immature Granulocytes: 0 %
Lymphocytes Relative: 28 %
Lymphs Abs: 1.4 10*3/uL (ref 0.7–4.0)
MCH: 35.3 pg — ABNORMAL HIGH (ref 26.0–34.0)
MCHC: 35.1 g/dL (ref 30.0–36.0)
MCV: 100.8 fL — ABNORMAL HIGH (ref 80.0–100.0)
Monocytes Absolute: 0.4 10*3/uL (ref 0.1–1.0)
Monocytes Relative: 7 %
Neutro Abs: 3.2 10*3/uL (ref 1.7–7.7)
Neutrophils Relative %: 64 %
Platelet Count: 145 10*3/uL — ABNORMAL LOW (ref 150–400)
RBC: 3.99 MIL/uL — ABNORMAL LOW (ref 4.22–5.81)
RDW: 12.1 % (ref 11.5–15.5)
WBC Count: 5 10*3/uL (ref 4.0–10.5)
nRBC: 0 % (ref 0.0–0.2)

## 2022-05-14 LAB — LACTATE DEHYDROGENASE: LDH: 141 U/L (ref 98–192)

## 2022-05-14 MED ORDER — PROCHLORPERAZINE MALEATE 10 MG PO TABS: 10.0000 mg | ORAL_TABLET | Freq: Once | ORAL | Status: DC

## 2022-05-14 MED ORDER — BORTEZOMIB CHEMO SQ INJECTION 3.5 MG (2.5MG/ML)
1.3000 mg/m2 | Freq: Once | INTRAMUSCULAR | Status: AC
  Administered 2022-05-14: 2.75 mg via SUBCUTANEOUS
  Filled 2022-05-14: qty 1.1

## 2022-05-14 NOTE — Patient Instructions (Signed)
Bortezomib Injection What is this medication? BORTEZOMIB (bor TEZ oh mib) treats lymphoma. It may also be used to treat multiple myeloma, a type of bone marrow cancer. It works by blocking a protein that causes cancer cells to grow and multiply. This helps to slow or stop the spread of cancer cells. This medicine may be used for other purposes; ask your health care provider or pharmacist if you have questions. COMMON BRAND NAME(S): Velcade What should I tell my care team before I take this medication? They need to know if you have any of these conditions: Dehydration Diabetes Heart disease Liver disease Tingling of the fingers or toes or other nerve disorder An unusual or allergic reaction to bortezomib, other medications, foods, dyes, or preservatives If you or your partner are pregnant or trying to get pregnant Breastfeeding How should I use this medication? This medication is injected into a vein or under the skin. It is given by your care team in a hospital or clinic setting. Talk to your care team about the use of this medication in children. Special care may be needed. Overdosage: If you think you have taken too much of this medicine contact a poison control center or emergency room at once. NOTE: This medicine is only for you. Do not share this medicine with others. What if I miss a dose? Keep appointments for follow-up doses. It is important not to miss your dose. Call your care team if you are unable to keep an appointment. What may interact with this medication? Ketoconazole Rifampin This list may not describe all possible interactions. Give your health care provider a list of all the medicines, herbs, non-prescription drugs, or dietary supplements you use. Also tell them if you smoke, drink alcohol, or use illegal drugs. Some items may interact with your medicine. What should I watch for while using this medication? Your condition will be monitored carefully while you are  receiving this medication. You may need blood work while taking this medication. This medication may affect your coordination, reaction time, or judgment. Do not drive or operate machinery until you know how this medication affects you. Sit up or stand slowly to reduce the risk of dizzy or fainting spells. Drinking alcohol with this medication can increase the risk of these side effects. This medication may increase your risk of getting an infection. Call your care team for advice if you get a fever, chills, sore throat, or other symptoms of a cold or flu. Do not treat yourself. Try to avoid being around people who are sick. Check with your care team if you have severe diarrhea, nausea, and vomiting, or if you sweat a lot. The loss of too much body fluid may make it dangerous for you to take this medication. Talk to your care team if you may be pregnant. Serious birth defects can occur if you take this medication during pregnancy and for 7 months after the last dose. You will need a negative pregnancy test before starting this medication. Contraception is recommended while taking this medication and for 7 months after the last dose. Your care team can help you find the option that works for you. If your partner can get pregnant, use a condom during sex while taking this medication and for 4 months after the last dose. Do not breastfeed while taking this medication and for 2 months after the last dose. This medication may cause infertility. Talk to your care team if you are concerned about your fertility. What side effects   may I notice from receiving this medication? Side effects that you should report to your care team as soon as possible: Allergic reactions--skin rash, itching, hives, swelling of the face, lips, tongue, or throat Bleeding--bloody or black, tar-like stools, vomiting blood or brown material that looks like coffee grounds, red or dark brown urine, small red or purple spots on skin, unusual  bruising or bleeding Bleeding in the brain--severe headache, stiff neck, confusion, dizziness, change in vision, numbness or weakness of the face, arm, or leg, trouble speaking, trouble walking, vomiting Bowel blockage--stomach cramping, unable to have a bowel movement or pass gas, loss of appetite, vomiting Heart failure--shortness of breath, swelling of the ankles, feet, or hands, sudden weight gain, unusual weakness or fatigue Infection--fever, chills, cough, sore throat, wounds that don't heal, pain or trouble when passing urine, general feeling of discomfort or being unwell Liver injury--right upper belly pain, loss of appetite, nausea, light-colored stool, dark yellow or brown urine, yellowing skin or eyes, unusual weakness or fatigue Low blood pressure--dizziness, feeling faint or lightheaded, blurry vision Lung injury--shortness of breath or trouble breathing, cough, spitting up blood, chest pain, fever Pain, tingling, or numbness in the hands or feet Severe or prolonged diarrhea Stomach pain, bloody diarrhea, pale skin, unusual weakness or fatigue, decrease in the amount of urine, which may be signs of hemolytic uremic syndrome Sudden and severe headache, confusion, change in vision, seizures, which may be signs of posterior reversible encephalopathy syndrome (PRES) TTP--purple spots on the skin or inside the mouth, pale skin, yellowing skin or eyes, unusual weakness or fatigue, fever, fast or irregular heartbeat, confusion, change in vision, trouble speaking, trouble walking Tumor lysis syndrome (TLS)--nausea, vomiting, diarrhea, decrease in the amount of urine, dark urine, unusual weakness or fatigue, confusion, muscle pain or cramps, fast or irregular heartbeat, joint pain Side effects that usually do not require medical attention (report to your care team if they continue or are bothersome): Constipation Diarrhea Fatigue Loss of appetite Nausea This list may not describe all possible  side effects. Call your doctor for medical advice about side effects. You may report side effects to FDA at 1-800-FDA-1088. Where should I keep my medication? This medication is given in a hospital or clinic. It will not be stored at home. NOTE: This sheet is a summary. It may not cover all possible information. If you have questions about this medicine, talk to your doctor, pharmacist, or health care provider.  2023 Elsevier/Gold Standard (2021-09-02 00:00:00)  

## 2022-05-21 ENCOUNTER — Other Ambulatory Visit: Payer: Self-pay

## 2022-05-24 DIAGNOSIS — C9001 Multiple myeloma in remission: Secondary | ICD-10-CM | POA: Diagnosis not present

## 2022-05-28 ENCOUNTER — Inpatient Hospital Stay: Payer: Medicare Other | Attending: Hematology & Oncology

## 2022-05-28 ENCOUNTER — Inpatient Hospital Stay: Payer: Medicare Other

## 2022-05-28 VITALS — BP 135/78 | HR 72 | Temp 97.9°F | Resp 16

## 2022-05-28 DIAGNOSIS — C9001 Multiple myeloma in remission: Secondary | ICD-10-CM

## 2022-05-28 DIAGNOSIS — Z5112 Encounter for antineoplastic immunotherapy: Secondary | ICD-10-CM | POA: Diagnosis not present

## 2022-05-28 DIAGNOSIS — C9000 Multiple myeloma not having achieved remission: Secondary | ICD-10-CM | POA: Diagnosis not present

## 2022-05-28 LAB — CMP (CANCER CENTER ONLY)
ALT: 29 U/L (ref 0–44)
AST: 25 U/L (ref 15–41)
Albumin: 4.6 g/dL (ref 3.5–5.0)
Alkaline Phosphatase: 56 U/L (ref 38–126)
Anion gap: 9 (ref 5–15)
BUN: 17 mg/dL (ref 6–20)
CO2: 23 mmol/L (ref 22–32)
Calcium: 8.9 mg/dL (ref 8.9–10.3)
Chloride: 106 mmol/L (ref 98–111)
Creatinine: 1.44 mg/dL — ABNORMAL HIGH (ref 0.61–1.24)
GFR, Estimated: 57 mL/min — ABNORMAL LOW (ref >60)
Glucose, Bld: 92 mg/dL (ref 70–99)
Potassium: 4.1 mmol/L (ref 3.5–5.1)
Sodium: 138 mmol/L (ref 135–145)
Total Bilirubin: 0.6 mg/dL (ref 0.3–1.2)
Total Protein: 7.7 g/dL (ref 6.5–8.1)

## 2022-05-28 LAB — CBC WITH DIFFERENTIAL (CANCER CENTER ONLY)
Abs Immature Granulocytes: 0.02 10*3/uL (ref 0.00–0.07)
Basophils Absolute: 0 10*3/uL (ref 0.0–0.1)
Basophils Relative: 0 %
Eosinophils Absolute: 0.1 10*3/uL (ref 0.0–0.5)
Eosinophils Relative: 1 %
HCT: 41.3 % (ref 39.0–52.0)
Hemoglobin: 14.4 g/dL (ref 13.0–17.0)
Immature Granulocytes: 0 %
Lymphocytes Relative: 27 %
Lymphs Abs: 1.6 10*3/uL (ref 0.7–4.0)
MCH: 35.1 pg — ABNORMAL HIGH (ref 26.0–34.0)
MCHC: 34.9 g/dL (ref 30.0–36.0)
MCV: 100.7 fL — ABNORMAL HIGH (ref 80.0–100.0)
Monocytes Absolute: 0.5 10*3/uL (ref 0.1–1.0)
Monocytes Relative: 8 %
Neutro Abs: 3.7 10*3/uL (ref 1.7–7.7)
Neutrophils Relative %: 64 %
Platelet Count: 159 10*3/uL (ref 150–400)
RBC: 4.1 MIL/uL — ABNORMAL LOW (ref 4.22–5.81)
RDW: 12.2 % (ref 11.5–15.5)
WBC Count: 5.8 10*3/uL (ref 4.0–10.5)
nRBC: 0 % (ref 0.0–0.2)

## 2022-05-28 MED ORDER — BORTEZOMIB CHEMO SQ INJECTION 3.5 MG (2.5MG/ML)
1.3000 mg/m2 | Freq: Once | INTRAMUSCULAR | Status: AC
  Administered 2022-05-28: 2.75 mg via SUBCUTANEOUS
  Filled 2022-05-28: qty 1.1

## 2022-05-28 NOTE — Patient Instructions (Signed)
[facility]  Discharge Instructions: Thank you for choosing [facility] to provide your oncology and hematology care.   If you have a lab appointment with the [facility], please go directly to the [facility] and check in at the registration area.  Wear comfortable clothing and clothing appropriate for easy access to any Portacath or PICC line.   We strive to give you quality time with your provider. You may need to reschedule your appointment if you arrive late (15 or more minutes).  Arriving late affects you and other patients whose appointments are after yours.  Also, if you miss three or more appointments without notifying the office, you may be dismissed from the clinic at the provider's discretion.      For prescription refill requests, have your pharmacy contact our office and allow 72 hours for refills to be completed.    Today you received the following chemotherapy and/or immunotherapy agents Velcade       To help prevent nausea and vomiting after your treatment, we encourage you to take your nausea medication as directed.  BELOW ARE SYMPTOMS THAT SHOULD BE REPORTED IMMEDIATELY: *FEVER GREATER THAN 100.4 F (38 C) OR HIGHER *CHILLS OR SWEATING *NAUSEA AND VOMITING THAT IS NOT CONTROLLED WITH YOUR NAUSEA MEDICATION *UNUSUAL SHORTNESS OF BREATH *UNUSUAL BRUISING OR BLEEDING *URINARY PROBLEMS (pain or burning when urinating, or frequent urination) *BOWEL PROBLEMS (unusual diarrhea, constipation, pain near the anus) TENDERNESS IN MOUTH AND THROAT WITH OR WITHOUT PRESENCE OF ULCERS (sore throat, sores in mouth, or a toothache) UNUSUAL RASH, SWELLING OR PAIN  UNUSUAL VAGINAL DISCHARGE OR ITCHING   Items with * indicate a potential emergency and should be followed up as soon as possible or go to the Emergency Department if any problems should occur.  Please show the CHEMOTHERAPY ALERT CARD or IMMUNOTHERAPY ALERT CARD at  check-in to the Emergency Department and triage nurse. Should you have questions after your visit or need to cancel or reschedule your appointment, please contact [facility]  [phone] and follow the prompts.  Office hours are 8:00 a.m. to 4:30 p.m. Monday - Friday. Please note that voicemails left after 4:00 p.m. may not be returned until the following business day.  We are closed weekends and major holidays. You have access to a nurse at all times for urgent questions. Please call the main number to the clinic [phone] and follow the prompts.  For any non-urgent questions, you may also contact your provider using MyChart. We now offer e-Visits for anyone 39 and older to request care online for non-urgent symptoms. For details visit [email or web address].   Also download the MyChart app! Go to the app store, search "MyChart", open the app, select [facility], and log in with your MyChart username and password.

## 2022-06-09 ENCOUNTER — Other Ambulatory Visit: Payer: Self-pay

## 2022-06-11 ENCOUNTER — Inpatient Hospital Stay: Payer: Medicare Other

## 2022-06-11 ENCOUNTER — Inpatient Hospital Stay: Payer: Medicare Other | Admitting: Hematology & Oncology

## 2022-06-15 ENCOUNTER — Inpatient Hospital Stay: Payer: Medicare Other

## 2022-06-15 ENCOUNTER — Encounter: Payer: Self-pay | Admitting: Hematology & Oncology

## 2022-06-15 ENCOUNTER — Inpatient Hospital Stay (HOSPITAL_BASED_OUTPATIENT_CLINIC_OR_DEPARTMENT_OTHER): Payer: Medicare Other | Admitting: Hematology & Oncology

## 2022-06-15 DIAGNOSIS — C9001 Multiple myeloma in remission: Secondary | ICD-10-CM

## 2022-06-15 DIAGNOSIS — C9000 Multiple myeloma not having achieved remission: Secondary | ICD-10-CM | POA: Diagnosis not present

## 2022-06-15 DIAGNOSIS — Z5112 Encounter for antineoplastic immunotherapy: Secondary | ICD-10-CM | POA: Diagnosis not present

## 2022-06-15 LAB — CMP (CANCER CENTER ONLY)
ALT: 29 U/L (ref 0–44)
AST: 23 U/L (ref 15–41)
Albumin: 4.7 g/dL (ref 3.5–5.0)
Alkaline Phosphatase: 52 U/L (ref 38–126)
Anion gap: 7 (ref 5–15)
BUN: 21 mg/dL — ABNORMAL HIGH (ref 6–20)
CO2: 27 mmol/L (ref 22–32)
Calcium: 9.2 mg/dL (ref 8.9–10.3)
Chloride: 104 mmol/L (ref 98–111)
Creatinine: 1.52 mg/dL — ABNORMAL HIGH (ref 0.61–1.24)
GFR, Estimated: 53 mL/min — ABNORMAL LOW (ref >60)
Glucose, Bld: 97 mg/dL (ref 70–99)
Potassium: 4.4 mmol/L (ref 3.5–5.1)
Sodium: 138 mmol/L (ref 135–145)
Total Bilirubin: 0.6 mg/dL (ref 0.3–1.2)
Total Protein: 7.3 g/dL (ref 6.5–8.1)

## 2022-06-15 LAB — CBC WITH DIFFERENTIAL (CANCER CENTER ONLY)
Abs Immature Granulocytes: 0.01 10*3/uL (ref 0.00–0.07)
Basophils Absolute: 0 10*3/uL (ref 0.0–0.1)
Basophils Relative: 0 %
Eosinophils Absolute: 0 10*3/uL (ref 0.0–0.5)
Eosinophils Relative: 1 %
HCT: 40.2 % (ref 39.0–52.0)
Hemoglobin: 14.2 g/dL (ref 13.0–17.0)
Immature Granulocytes: 0 %
Lymphocytes Relative: 31 %
Lymphs Abs: 1.6 10*3/uL (ref 0.7–4.0)
MCH: 35.5 pg — ABNORMAL HIGH (ref 26.0–34.0)
MCHC: 35.3 g/dL (ref 30.0–36.0)
MCV: 100.5 fL — ABNORMAL HIGH (ref 80.0–100.0)
Monocytes Absolute: 0.4 10*3/uL (ref 0.1–1.0)
Monocytes Relative: 8 %
Neutro Abs: 2.9 10*3/uL (ref 1.7–7.7)
Neutrophils Relative %: 60 %
Platelet Count: 156 10*3/uL (ref 150–400)
RBC: 4 MIL/uL — ABNORMAL LOW (ref 4.22–5.81)
RDW: 12.1 % (ref 11.5–15.5)
WBC Count: 4.9 10*3/uL (ref 4.0–10.5)
nRBC: 0 % (ref 0.0–0.2)

## 2022-06-15 LAB — LACTATE DEHYDROGENASE: LDH: 148 U/L (ref 98–192)

## 2022-06-15 MED ORDER — BORTEZOMIB CHEMO SQ INJECTION 3.5 MG (2.5MG/ML)
1.3000 mg/m2 | Freq: Once | INTRAMUSCULAR | Status: AC
  Administered 2022-06-15: 2.75 mg via SUBCUTANEOUS
  Filled 2022-06-15: qty 1.1

## 2022-06-15 MED ORDER — PROCHLORPERAZINE MALEATE 10 MG PO TABS: 10.0000 mg | ORAL_TABLET | Freq: Once | ORAL | Status: DC

## 2022-06-15 NOTE — Patient Instructions (Signed)
[facility] [place]  Discharge Instructions: Thank you for choosing [facility] to provide your oncology and hematology care.   If you have a lab appointment with the [facility], please go directly to the [facility] and check in at the registration area.  Wear comfortable clothing and clothing appropriate for easy access to any Portacath or PICC line.   We strive to give you quality time with your provider. You may need to reschedule your appointment if you arrive late (15 or more minutes).  Arriving late affects you and other patients whose appointments are after yours.  Also, if you miss three or more appointments without notifying the office, you may be dismissed from the clinic at the provider's discretion.      For prescription refill requests, have your pharmacy contact our office and allow 72 hours for refills to be completed.    Today you received the following chemotherapy and/or immunotherapy agents:  Velcade      To help prevent nausea and vomiting after your treatment, we encourage you to take your nausea medication as directed.  BELOW ARE SYMPTOMS THAT SHOULD BE REPORTED IMMEDIATELY: *FEVER GREATER THAN 100.4 F (38 C) OR HIGHER *CHILLS OR SWEATING *NAUSEA AND VOMITING THAT IS NOT CONTROLLED WITH YOUR NAUSEA MEDICATION *UNUSUAL SHORTNESS OF BREATH *UNUSUAL BRUISING OR BLEEDING *URINARY PROBLEMS (pain or burning when urinating, or frequent urination) *BOWEL PROBLEMS (unusual diarrhea, constipation, pain near the anus) TENDERNESS IN MOUTH AND THROAT WITH OR WITHOUT PRESENCE OF ULCERS (sore throat, sores in mouth, or a toothache) UNUSUAL RASH, SWELLING OR PAIN  UNUSUAL VAGINAL DISCHARGE OR ITCHING   Items with * indicate a potential emergency and should be followed up as soon as possible or go to the Emergency Department if any problems should occur.  Please show the CHEMOTHERAPY ALERT CARD or IMMUNOTHERAPY ALERT CARD at  check-in to the Emergency Department and triage nurse. Should you have questions after your visit or need to cancel or reschedule your appointment, please contact [facility]  [phone] and follow the prompts.  Office hours are 8:00 a.m. to 4:30 p.m. Monday - Friday. Please note that voicemails left after 4:00 p.m. may not be returned until the following business day.  We are closed weekends and major holidays. You have access to a nurse at all times for urgent questions. Please call the main number to the clinic [phone] and follow the prompts.  For any non-urgent questions, you may also contact your provider using MyChart. We now offer e-Visits for anyone 61 and older to request care online for non-urgent symptoms. For details visit [email or web address].   Also download the MyChart app! Go to the app store, search "MyChart", open the app, select [facility], and log in with your MyChart username and password.

## 2022-06-15 NOTE — Progress Notes (Signed)
Hematology and Oncology Follow Up Visit  Tyler Phillips [id number] 06/15/1965 57 y.o. 06/15/2022   Principle Diagnosis:  IgG Kappa myeloma - +4, +14, +17   Past Therapy: Status post autologous stem cell transplant on 05/22/2015   S/p cycle 6 of RVD [place] 4 mg po q 2 week -- start on 01/02/2019 -- d/c on 02/10/2019   Current Therapy:        Velcade q 2wk dosing Revlimid '10mg'$  po q day (21/7)  - d/c on 12/07/2021   Interim History:  [name] is here today for follow-up and treatment.  So far, he was things going quite well.  As always, he and his wife are traveling.  They are expecting their first grandchild in late March.  So happy for them.  Their son will be going down to [place] for the summer to do an internship at the [employer]er's office down in [facility].  I am sure this will be a great experience for him.  They bought property up in [place].  He is always working on something.  He is doing well with respect to the Velcade.  Last time that we saw him, there was no monoclonal spike in his blood.  His last Kappa light chain was 2.6 mg/dL.  This is holding steady.  He has had no fever.  There is been no problems with COVID.  He has had no change in bowel or bladder habits.  He has had no rashes.  He has had no cough or shortness of breath.  Overall, I would say his performance status is probably ECOG 0.     Medications:  Allergies as of 06/15/2022   No Known Allergies      Medication List        Accurate as of June 15, 2022  8:35 AM. If you have any questions, ask your nurse or doctor.          aspirin 325 MG tablet Take 325 mg by mouth daily.   azithromycin 500 MG tablet Commonly known as: ZITHROMAX Take 500 mg by mouth daily as needed (For pre dental appointments).   bortezomib IV 3.5 MG injection Commonly known as: VELCADE 1 mg/m2 every 14 (fourteen) days.   cetirizine 10 MG tablet Commonly known as: ZYRTEC Take 10 mg by mouth  daily.   multivitamin with minerals Tabs tablet Take 1 tablet by mouth daily.   ondansetron 4 MG tablet Commonly known as: ZOFRAN Take 1 tablet (4 mg total) by mouth every 6 (six) hours as needed for nausea.   valACYclovir 500 MG tablet Commonly known as: VALTREX Take 500 mg by mouth 2 (two) times daily.   zolpidem 10 MG tablet Commonly known as: AMBIEN Take 1 tablet (10 mg total) by mouth at bedtime as needed for sleep.        Allergies: No Known Allergies  Past Medical History, Surgical history, Social history, and Family History were reviewed and updated.  Review of Systems: Review of Systems  Constitutional: Negative.   HENT: Negative.    Eyes: Negative.   Respiratory: Negative.    Cardiovascular: Negative.   Gastrointestinal: Negative.   Genitourinary: Negative.   Musculoskeletal: Negative.   Skin: Negative.   Neurological: Negative.   Endo/Heme/Allergies: Negative.   Psychiatric/Behavioral: Negative.     [facility]   Physical Exam:  height is '6\' 1"'$  (1.854 m) and weight is 217 lb 1.9 oz (98.5 kg). His oral temperature is 98 F (36.7 C). His blood pressure  is 140/90 (abnormal) and his pulse is 54 (abnormal). His respiration is 20 and oxygen saturation is 98%.   Wt Readings from Last 3 Encounters:  06/15/22 217 lb 1.9 oz (98.5 kg)  04/30/22 215 lb 1.9 oz (97.6 kg)  03/19/22 217 lb (98.4 kg)    Physical Exam Vitals reviewed.  HENT:     Head: Normocephalic and atraumatic.  Eyes:     Pupils: Pupils are equal, round, and reactive to light.  Cardiovascular:     Rate and Rhythm: Normal rate and regular rhythm.     Heart sounds: Normal heart sounds.  Pulmonary:     Effort: Pulmonary effort is normal.     Breath sounds: Normal breath sounds.  Abdominal:     General: Bowel sounds are normal.     Palpations: Abdomen is soft.  Musculoskeletal:        General: No tenderness or deformity. Normal range of motion.     Cervical back: Normal range of motion.   Lymphadenopathy:     Cervical: No cervical adenopathy.  Skin:    General: Skin is warm and dry.     Findings: No erythema or rash.  Neurological:     Mental Status: He is alert and oriented to person, place, and time.  Psychiatric:        Behavior: Behavior normal.        Thought Content: Thought content normal.        Judgment: Judgment normal.     Lab Results  Component Value Date   WBC 4.9 06/15/2022   HGB 14.2 06/15/2022   HCT 40.2 06/15/2022   MCV 100.5 (H) 06/15/2022   PLT 156 06/15/2022   Lab Results  Component Value Date   FERRITIN 1,263 (H) 07/20/2014   IRON 125 07/20/2014   TIBC 198 (L) 07/20/2014   UIBC 73 (L) 07/20/2014   IRONPCTSAT 63 (H) 07/20/2014   Lab Results  Component Value Date   RETICCTPCT 0.8 07/20/2014   RBC 4.00 (L) 06/15/2022   Lab Results  Component Value Date   KPAFRELGTCHN 26.2 (H) 04/30/2022   LAMBDASER 19.5 04/30/2022   KAPLAMBRATIO 1.34 04/30/2022   Lab Results  Component Value Date   IGGSERUM 1,467 04/30/2022   IGA 195 04/30/2022   IGMSERUM 26 04/30/2022   Lab Results  Component Value Date   TOTALPROTELP 6.8 04/30/2022   ALBUMINELP 3.9 04/30/2022   A1GS 0.2 04/30/2022   A2GS 0.6 04/30/2022   BETS 0.9 04/30/2022   BETA2SER 0.3 03/28/2015   GAMS 1.2 04/30/2022   MSPIKE Not Observed 04/30/2022   SPEI Comment 04/30/2022     Chemistry      Component Value Date/Time   NA 138 06/15/2022 0751   NA 140 04/22/2017 1140   NA 138 09/03/2015 1042   K 4.4 06/15/2022 0751   K 4.0 04/22/2017 1140   K 4.3 09/03/2015 1042   CL 104 06/15/2022 0751   CL 105 04/22/2017 1140   CO2 27 06/15/2022 0751   CO2 24 04/22/2017 1140   CO2 25 09/03/2015 1042   BUN 21 (H) 06/15/2022 0751   BUN 16 04/22/2017 1140   BUN 21.7 09/03/2015 1042   CREATININE 1.52 (H) 06/15/2022 0751   CREATININE 1.6 (H) 04/22/2017 1140   CREATININE 1.2 09/03/2015 1042      Component Value Date/Time   CALCIUM 9.2 06/15/2022 0751   CALCIUM 8.5 04/22/2017 1140    CALCIUM 9.1 09/03/2015 1042   ALKPHOS 52 06/15/2022 0751   ALKPHOS  59 04/22/2017 1140   ALKPHOS 42 09/03/2015 1042   AST 23 06/15/2022 0751   AST 27 09/03/2015 1042   ALT 29 06/15/2022 0751   ALT 45 04/22/2017 1140   ALT 41 09/03/2015 1042   BILITOT 0.6 06/15/2022 0751   BILITOT 0.54 09/03/2015 1042       Impression and Plan: [name] is a very pleasant 57 yo caucasian gentleman with IgG kappa myeloma. He underwent induction chemotherapy with RVD followed by an autologous stem cell transplant at [facility] on February 2017.   He is done remarkably well.  We stopped the Revlimid back in August 2023 because of his blood counts being low.  I was worried about him developing myelodysplasia.  He really is doing nicely.  The every 2-week Velcade seems to be working well for him.  Maybe, at some point in the future, we will be able to move the Velcade every 3 weeks.  Again, I am just incredibly happy that he and his wife will be grandparents.  We will certainly can make an adjustment with his schedule depending on the baby's birth.  I will plan to see him back in another 6 weeks.   [name], MD 2/27/20248:35 AM

## 2022-06-15 NOTE — Progress Notes (Signed)
Okay to treat with SCr 1.52 per Dr. [name].

## 2022-06-16 ENCOUNTER — Other Ambulatory Visit: Payer: Self-pay

## 2022-06-16 LAB — KAPPA/LAMBDA LIGHT CHAINS
Kappa free light chain: 25 mg/L — ABNORMAL HIGH (ref 3.3–19.4)
Kappa, lambda light chain ratio: 1.23 (ref 0.26–1.65)
Lambda free light chains: 20.3 mg/L (ref 5.7–26.3)

## 2022-06-16 LAB — IGG, IGA, IGM
IgA: 195 mg/dL (ref 90–386)
IgG (Immunoglobin G), Serum: 1261 mg/dL (ref 603–1613)
IgM (Immunoglobulin M), Srm: 31 mg/dL (ref 20–172)

## 2022-06-18 LAB — PROTEIN ELECTROPHORESIS, SERUM, WITH REFLEX
A/G Ratio: 1.4 (ref 0.7–1.7)
Albumin ELP: 4.2 g/dL (ref 2.9–4.4)
Alpha-1-Globulin: 0.2 g/dL (ref 0.0–0.4)
Alpha-2-Globulin: 0.6 g/dL (ref 0.4–1.0)
Beta Globulin: 0.9 g/dL (ref 0.7–1.3)
Gamma Globulin: 1.3 g/dL (ref 0.4–1.8)
Globulin, Total: 2.9 g/dL (ref 2.2–3.9)
Total Protein ELP: 7.1 g/dL (ref 6.0–8.5)

## 2022-07-02 ENCOUNTER — Inpatient Hospital Stay: Payer: Medicare Other | Attending: Hematology & Oncology

## 2022-07-02 ENCOUNTER — Inpatient Hospital Stay: Payer: Medicare Other

## 2022-07-02 VITALS — BP 129/85 | HR 61 | Temp 98.0°F | Resp 18

## 2022-07-02 DIAGNOSIS — Z5112 Encounter for antineoplastic immunotherapy: Secondary | ICD-10-CM | POA: Insufficient documentation

## 2022-07-02 DIAGNOSIS — C9000 Multiple myeloma not having achieved remission: Secondary | ICD-10-CM | POA: Diagnosis not present

## 2022-07-02 DIAGNOSIS — C9001 Multiple myeloma in remission: Secondary | ICD-10-CM

## 2022-07-02 LAB — CBC WITH DIFFERENTIAL (CANCER CENTER ONLY)
Abs Immature Granulocytes: 0.01 10*3/uL (ref 0.00–0.07)
Basophils Absolute: 0 10*3/uL (ref 0.0–0.1)
Basophils Relative: 0 %
Eosinophils Absolute: 0 10*3/uL (ref 0.0–0.5)
Eosinophils Relative: 1 %
HCT: 38.7 % — ABNORMAL LOW (ref 39.0–52.0)
Hemoglobin: 14 g/dL (ref 13.0–17.0)
Immature Granulocytes: 0 %
Lymphocytes Relative: 29 %
Lymphs Abs: 1.4 10*3/uL (ref 0.7–4.0)
MCH: 35.9 pg — ABNORMAL HIGH (ref 26.0–34.0)
MCHC: 36.2 g/dL — ABNORMAL HIGH (ref 30.0–36.0)
MCV: 99.2 fL (ref 80.0–100.0)
Monocytes Absolute: 0.4 10*3/uL (ref 0.1–1.0)
Monocytes Relative: 9 %
Neutro Abs: 2.8 10*3/uL (ref 1.7–7.7)
Neutrophils Relative %: 61 %
Platelet Count: 139 10*3/uL — ABNORMAL LOW (ref 150–400)
RBC: 3.9 MIL/uL — ABNORMAL LOW (ref 4.22–5.81)
RDW: 12.1 % (ref 11.5–15.5)
WBC Count: 4.6 10*3/uL (ref 4.0–10.5)
nRBC: 0 % (ref 0.0–0.2)

## 2022-07-02 LAB — CMP (CANCER CENTER ONLY)
ALT: 31 U/L (ref 0–44)
AST: 27 U/L (ref 15–41)
Albumin: 4.6 g/dL (ref 3.5–5.0)
Alkaline Phosphatase: 55 U/L (ref 38–126)
Anion gap: 8 (ref 5–15)
BUN: 21 mg/dL — ABNORMAL HIGH (ref 6–20)
CO2: 23 mmol/L (ref 22–32)
Calcium: 8.9 mg/dL (ref 8.9–10.3)
Chloride: 104 mmol/L (ref 98–111)
Creatinine: 1.48 mg/dL — ABNORMAL HIGH (ref 0.61–1.24)
GFR, Estimated: 55 mL/min — ABNORMAL LOW (ref >60)
Glucose, Bld: 96 mg/dL (ref 70–99)
Potassium: 4.2 mmol/L (ref 3.5–5.1)
Sodium: 135 mmol/L (ref 135–145)
Total Bilirubin: 0.6 mg/dL (ref 0.3–1.2)
Total Protein: 7.3 g/dL (ref 6.5–8.1)

## 2022-07-02 MED ORDER — BORTEZOMIB CHEMO SQ INJECTION 3.5 MG (2.5MG/ML)
1.3000 mg/m2 | Freq: Once | INTRAMUSCULAR | Status: AC
  Administered 2022-07-02: 2.75 mg via SUBCUTANEOUS
  Filled 2022-07-02: qty 1.1

## 2022-07-02 MED ORDER — PROCHLORPERAZINE MALEATE 10 MG PO TABS: 10.0000 mg | ORAL_TABLET | Freq: Once | ORAL | Status: DC

## 2022-07-13 ENCOUNTER — Ambulatory Visit: Payer: Medicare Other

## 2022-07-13 ENCOUNTER — Other Ambulatory Visit: Payer: Medicare Other

## 2022-07-20 ENCOUNTER — Other Ambulatory Visit: Payer: Self-pay

## 2022-07-22 ENCOUNTER — Inpatient Hospital Stay: Payer: Medicare Other | Attending: Hematology & Oncology

## 2022-07-22 ENCOUNTER — Encounter: Payer: Self-pay | Admitting: Medical Oncology

## 2022-07-22 ENCOUNTER — Other Ambulatory Visit: Payer: Self-pay

## 2022-07-22 ENCOUNTER — Other Ambulatory Visit: Payer: Self-pay | Admitting: *Deleted

## 2022-07-22 ENCOUNTER — Inpatient Hospital Stay: Payer: Medicare Other

## 2022-07-22 ENCOUNTER — Inpatient Hospital Stay (HOSPITAL_BASED_OUTPATIENT_CLINIC_OR_DEPARTMENT_OTHER): Payer: Medicare Other | Admitting: Medical Oncology

## 2022-07-22 VITALS — BP 141/97 | HR 53 | Temp 97.8°F | Resp 18 | Ht 73.0 in | Wt 218.0 lb

## 2022-07-22 DIAGNOSIS — C9001 Multiple myeloma in remission: Secondary | ICD-10-CM | POA: Diagnosis not present

## 2022-07-22 DIAGNOSIS — C9000 Multiple myeloma not having achieved remission: Secondary | ICD-10-CM

## 2022-07-22 DIAGNOSIS — Z9484 Stem cells transplant status: Secondary | ICD-10-CM | POA: Diagnosis not present

## 2022-07-22 DIAGNOSIS — Z5112 Encounter for antineoplastic immunotherapy: Secondary | ICD-10-CM | POA: Insufficient documentation

## 2022-07-22 LAB — CBC WITH DIFFERENTIAL (CANCER CENTER ONLY)
Abs Immature Granulocytes: 0.01 10*3/uL (ref 0.00–0.07)
Basophils Absolute: 0 10*3/uL (ref 0.0–0.1)
Basophils Relative: 0 %
Eosinophils Absolute: 0 10*3/uL (ref 0.0–0.5)
Eosinophils Relative: 1 %
HCT: 39.5 % (ref 39.0–52.0)
Hemoglobin: 14.2 g/dL (ref 13.0–17.0)
Immature Granulocytes: 0 %
Lymphocytes Relative: 32 %
Lymphs Abs: 1.4 10*3/uL (ref 0.7–4.0)
MCH: 35.3 pg — ABNORMAL HIGH (ref 26.0–34.0)
MCHC: 35.9 g/dL (ref 30.0–36.0)
MCV: 98.3 fL (ref 80.0–100.0)
Monocytes Absolute: 0.3 10*3/uL (ref 0.1–1.0)
Monocytes Relative: 8 %
Neutro Abs: 2.6 10*3/uL (ref 1.7–7.7)
Neutrophils Relative %: 59 %
Platelet Count: 139 10*3/uL — ABNORMAL LOW (ref 150–400)
RBC: 4.02 MIL/uL — ABNORMAL LOW (ref 4.22–5.81)
RDW: 11.9 % (ref 11.5–15.5)
WBC Count: 4.4 10*3/uL (ref 4.0–10.5)
nRBC: 0 % (ref 0.0–0.2)

## 2022-07-22 LAB — CMP (CANCER CENTER ONLY)
ALT: 34 U/L (ref 0–44)
AST: 31 U/L (ref 15–41)
Albumin: 4.3 g/dL (ref 3.5–5.0)
Alkaline Phosphatase: 51 U/L (ref 38–126)
Anion gap: 9 (ref 5–15)
BUN: 20 mg/dL (ref 6–20)
CO2: 23 mmol/L (ref 22–32)
Calcium: 8.6 mg/dL — ABNORMAL LOW (ref 8.9–10.3)
Chloride: 101 mmol/L (ref 98–111)
Creatinine: 1.44 mg/dL — ABNORMAL HIGH (ref 0.61–1.24)
GFR, Estimated: 57 mL/min — ABNORMAL LOW (ref >60)
Glucose, Bld: 84 mg/dL (ref 70–99)
Potassium: 4 mmol/L (ref 3.5–5.1)
Sodium: 133 mmol/L — ABNORMAL LOW (ref 135–145)
Total Bilirubin: 0.9 mg/dL (ref 0.3–1.2)
Total Protein: 7.3 g/dL (ref 6.5–8.1)

## 2022-07-22 LAB — LACTATE DEHYDROGENASE: LDH: 154 U/L (ref 98–192)

## 2022-07-22 MED ORDER — BORTEZOMIB CHEMO SQ INJECTION 3.5 MG (2.5MG/ML)
1.3000 mg/m2 | Freq: Once | INTRAMUSCULAR | Status: AC
  Administered 2022-07-22: 2.75 mg via SUBCUTANEOUS
  Filled 2022-07-22: qty 1.1

## 2022-07-22 NOTE — Patient Instructions (Signed)
[facility]  Discharge Instructions: Thank you for choosing [facility] to provide your oncology and hematology care.   If you have a lab appointment with the Cancer Center, please go directly to the Cancer Center and check in at the registration area.  Wear comfortable clothing and clothing appropriate for easy access to any Portacath or PICC line.   We strive to give you quality time with your provider. You may need to reschedule your appointment if you arrive late (15 or more minutes).  Arriving late affects you and other patients whose appointments are after yours.  Also, if you miss three or more appointments without notifying the office, you may be dismissed from the clinic at the provider's discretion.      For prescription refill requests, have your pharmacy contact our office and allow 72 hours for refills to be completed.    Today you received the following chemotherapy and/or immunotherapy agents Velcade.      To help prevent nausea and vomiting after your treatment, we encourage you to take your nausea medication as directed.  BELOW ARE SYMPTOMS THAT SHOULD BE REPORTED IMMEDIATELY: *FEVER GREATER THAN 100.4 F (38 C) OR HIGHER *CHILLS OR SWEATING *NAUSEA AND VOMITING THAT IS NOT CONTROLLED WITH YOUR NAUSEA MEDICATION *UNUSUAL SHORTNESS OF BREATH *UNUSUAL BRUISING OR BLEEDING *URINARY PROBLEMS (pain or burning when urinating, or frequent urination) *BOWEL PROBLEMS (unusual diarrhea, constipation, pain near the anus) TENDERNESS IN MOUTH AND THROAT WITH OR WITHOUT PRESENCE OF ULCERS (sore throat, sores in mouth, or a toothache) UNUSUAL RASH, SWELLING OR PAIN  UNUSUAL VAGINAL DISCHARGE OR ITCHING   Items with * indicate a potential emergency and should be followed up as soon as possible or go to the Emergency Department if any problems should occur.  Please show the CHEMOTHERAPY ALERT CARD or IMMUNOTHERAPY ALERT CARD at check-in  to the Emergency Department and triage nurse. Should you have questions after your visit or need to cancel or reschedule your appointment, please contact [facility]  [phone] and follow the prompts.  Office hours are 8:00 a.m. to 4:30 p.m. Monday - Friday. Please note that voicemails left after 4:00 p.m. may not be returned until the following business day.  We are closed weekends and major holidays. You have access to a nurse at all times for urgent questions. Please call the main number to the clinic [phone] and follow the prompts.  For any non-urgent questions, you may also contact your provider using MyChart. We now offer e-Visits for anyone 18 and older to request care online for non-urgent symptoms. For details visit [email or web address].   Also download the MyChart app! Go to the app store, search "MyChart", open the app, select [facility], and log in with your MyChart username and password.   

## 2022-07-22 NOTE — Progress Notes (Signed)
Hematology and Oncology Follow Up Visit  Tyler Phillips [id number] Jun 26, 1965 57 y.o. 07/22/2022   Principle Diagnosis:  IgG Kappa myeloma - +4, +14, +17   Past Therapy: Status post autologous stem cell transplant on 05/22/2015   S/p cycle 6 of RVD Ninlaro 4 mg po q 2 week -- start on 01/02/2019 -- d/c on 02/10/2019   Current Therapy:        Velcade q 2wk dosing Revlimid 10mg  po q day (21/7)  - d/c on 12/07/2021   Interim History:  [name] is here today for follow-up and treatment. Since his last visit he had a granddaughter born. They were able to visit her and her parents in [place] a few weeks ago. It was a nice times.   He is doing really well on the current Velcade treatment. He is tolerating this well and his counts have been stable.   At his last visit in Feb his M-spike was not observed,  his last Kappa light chain was 2.5 mg/dL which is actually down a bit from previous readings. No significant changes in his immunoglobulins.   He has had no fever, unintentional weight loss or night sweats. No reported new bone pains. He has had no change in bowel or bladder habits.  He has had no rashes.  He has had no cough or shortness of breath.  Overall, I would say his performance status is probably ECOG 0.    Wt Readings from Last 3 Encounters:  07/22/22 218 lb 0.6 oz (98.9 kg)  06/15/22 217 lb 1.9 oz (98.5 kg)  04/30/22 215 lb 1.9 oz (97.6 kg)    Medications:  Allergies as of 07/22/2022   No Known Allergies      Medication List        Accurate as of July 22, 2022  2:55 PM. If you have any questions, ask your nurse or doctor.          aspirin 325 MG tablet Take 325 mg by mouth daily.   azithromycin 500 MG tablet Commonly known as: ZITHROMAX Take 500 mg by mouth daily as needed (For pre dental appointments).   bortezomib IV 3.5 MG injection Commonly known as: VELCADE 1 mg/m2 every 14 (fourteen) days.   cetirizine 10 MG tablet Commonly known as:  ZYRTEC Take 10 mg by mouth daily.   multivitamin with minerals Tabs tablet Take 1 tablet by mouth daily.   ondansetron 4 MG tablet Commonly known as: ZOFRAN Take 1 tablet (4 mg total) by mouth every 6 (six) hours as needed for nausea.   valACYclovir 500 MG tablet Commonly known as: VALTREX Take 500 mg by mouth 2 (two) times daily.   zolpidem 10 MG tablet Commonly known as: AMBIEN Take 1 tablet (10 mg total) by mouth at bedtime as needed for sleep.        Allergies: No Known Allergies  Past Medical History, Surgical history, Social history, and Family History were reviewed and updated.  Review of Systems: Review of Systems  Constitutional: Negative.   HENT: Negative.    Eyes: Negative.   Respiratory: Negative.    Cardiovascular: Negative.   Gastrointestinal: Negative.   Genitourinary: Negative.   Musculoskeletal: Negative.   Skin: Negative.   Neurological: Negative.   Endo/Heme/Allergies: Negative.   Psychiatric/Behavioral: Negative.     Marland Kitchen   Physical Exam:  height is 6\' 1"  (1.854 m) and weight is 218 lb 0.6 oz (98.9 kg). His oral temperature is 97.8 F (36.6 C). His blood  pressure is 141/97 (abnormal) and his pulse is 53 (abnormal). His respiration is 18 and oxygen saturation is 100%.   Wt Readings from Last 3 Encounters:  07/22/22 218 lb 0.6 oz (98.9 kg)  06/15/22 217 lb 1.9 oz (98.5 kg)  04/30/22 215 lb 1.9 oz (97.6 kg)    Physical Exam Vitals reviewed.  HENT:     Head: Normocephalic and atraumatic.  Eyes:     Pupils: Pupils are equal, round, and reactive to light.  Cardiovascular:     Rate and Rhythm: Normal rate and regular rhythm.     Heart sounds: Normal heart sounds.  Pulmonary:     Effort: Pulmonary effort is normal.     Breath sounds: Normal breath sounds.  Musculoskeletal:        General: No tenderness or deformity. Normal range of motion.     Cervical back: Normal range of motion.  Lymphadenopathy:     Cervical: No cervical adenopathy.   Skin:    General: Skin is warm and dry.     Findings: No erythema or rash.  Neurological:     Mental Status: He is alert and oriented to person, place, and time.  Psychiatric:        Behavior: Behavior normal.        Thought Content: Thought content normal.        Judgment: Judgment normal.      Lab Results  Component Value Date   WBC 4.4 07/22/2022   HGB 14.2 07/22/2022   HCT 39.5 07/22/2022   MCV 98.3 07/22/2022   PLT 139 (L) 07/22/2022   Lab Results  Component Value Date   FERRITIN 1,263 (H) 07/20/2014   IRON 125 07/20/2014   TIBC 198 (L) 07/20/2014   UIBC 73 (L) 07/20/2014   IRONPCTSAT 63 (H) 07/20/2014   Lab Results  Component Value Date   RETICCTPCT 0.8 07/20/2014   RBC 4.02 (L) 07/22/2022   Lab Results  Component Value Date   KPAFRELGTCHN 25.0 (H) 06/15/2022   LAMBDASER 20.3 06/15/2022   KAPLAMBRATIO 1.23 06/15/2022   Lab Results  Component Value Date   IGGSERUM 1,261 06/15/2022   IGA 195 06/15/2022   IGMSERUM 31 06/15/2022   Lab Results  Component Value Date   TOTALPROTELP 7.1 06/15/2022   ALBUMINELP 4.2 06/15/2022   A1GS 0.2 06/15/2022   A2GS 0.6 06/15/2022   BETS 0.9 06/15/2022   BETA2SER 0.3 03/28/2015   GAMS 1.3 06/15/2022   MSPIKE Not Observed 06/15/2022   SPEI Comment 04/30/2022     Chemistry      Component Value Date/Time   NA 133 (L) 07/22/2022 1120   NA 140 04/22/2017 1140   NA 138 09/03/2015 1042   K 4.0 07/22/2022 1120   K 4.0 04/22/2017 1140   K 4.3 09/03/2015 1042   CL 101 07/22/2022 1120   CL 105 04/22/2017 1140   CO2 23 07/22/2022 1120   CO2 24 04/22/2017 1140   CO2 25 09/03/2015 1042   BUN 20 07/22/2022 1120   BUN 16 04/22/2017 1140   BUN 21.7 09/03/2015 1042   CREATININE 1.44 (H) 07/22/2022 1120   CREATININE 1.6 (H) 04/22/2017 1140   CREATININE 1.2 09/03/2015 1042      Component Value Date/Time   CALCIUM 8.6 (L) 07/22/2022 1120   CALCIUM 8.5 04/22/2017 1140   CALCIUM 9.1 09/03/2015 1042   ALKPHOS 51  07/22/2022 1120   ALKPHOS 59 04/22/2017 1140   ALKPHOS 42 09/03/2015 1042   AST 31 07/22/2022  1120   AST 27 09/03/2015 1042   ALT 34 07/22/2022 1120   ALT 45 04/22/2017 1140   ALT 41 09/03/2015 1042   BILITOT 0.9 07/22/2022 1120   BILITOT 0.54 09/03/2015 1042      Impression and Plan: [name] is a very pleasant 57 yo caucasian gentleman with IgG kappa myeloma. He underwent induction chemotherapy with RVD followed by an autologous stem cell transplant at [facility] on February 2017.   His Revlimid was stopped back in August 2023 because of his blood counts being low.   His current plan of treatment every 2-weeks with Velcade seems to be working well for him.  Dr. Elnoria Howard stated in his last note that he may eventually move to treatment every 3 weeks.   RTC 2 weeks labs, velcade RTC 4 weeks labs, velcade RTC 6 weeks MD/APP, labs, velcade    Holli Humbles [place], [place] 4/4/20242:55 PM

## 2022-07-23 LAB — KAPPA/LAMBDA LIGHT CHAINS
Kappa free light chain: 20.8 mg/L — ABNORMAL HIGH (ref 3.3–19.4)
Kappa, lambda light chain ratio: 1.11 (ref 0.26–1.65)
Lambda free light chains: 18.7 mg/L (ref 5.7–26.3)

## 2022-07-23 LAB — IGG, IGA, IGM
IgA: 174 mg/dL (ref 90–386)
IgG (Immunoglobin G), Serum: 1227 mg/dL (ref 603–1613)
IgM (Immunoglobulin M), Srm: 29 mg/dL (ref 20–172)

## 2022-07-24 ENCOUNTER — Other Ambulatory Visit: Payer: Self-pay

## 2022-07-25 ENCOUNTER — Other Ambulatory Visit: Payer: Self-pay

## 2022-07-26 ENCOUNTER — Inpatient Hospital Stay: Payer: Medicare Other

## 2022-07-26 DIAGNOSIS — Z5112 Encounter for antineoplastic immunotherapy: Secondary | ICD-10-CM | POA: Diagnosis not present

## 2022-07-26 DIAGNOSIS — Z9484 Stem cells transplant status: Secondary | ICD-10-CM | POA: Diagnosis not present

## 2022-07-26 DIAGNOSIS — C9000 Multiple myeloma not having achieved remission: Secondary | ICD-10-CM | POA: Diagnosis not present

## 2022-07-26 DIAGNOSIS — C9001 Multiple myeloma in remission: Secondary | ICD-10-CM

## 2022-07-26 LAB — PROTEIN ELECTROPHORESIS, SERUM, WITH REFLEX
A/G Ratio: 1.6 (ref 0.7–1.7)
Albumin ELP: 4.2 g/dL (ref 2.9–4.4)
Alpha-1-Globulin: 0.2 g/dL (ref 0.0–0.4)
Alpha-2-Globulin: 0.6 g/dL (ref 0.4–1.0)
Beta Globulin: 0.9 g/dL (ref 0.7–1.3)
Gamma Globulin: 1.2 g/dL (ref 0.4–1.8)
Globulin, Total: 2.7 g/dL (ref 2.2–3.9)
Total Protein ELP: 6.9 g/dL (ref 6.0–8.5)

## 2022-07-27 ENCOUNTER — Ambulatory Visit: Payer: Medicare Other | Admitting: Hematology & Oncology

## 2022-07-27 ENCOUNTER — Other Ambulatory Visit: Payer: Medicare Other

## 2022-07-27 ENCOUNTER — Ambulatory Visit: Payer: Medicare Other

## 2022-07-28 LAB — UPEP/UIFE/LIGHT CHAINS/TP, 24-HR UR
% BETA, Urine: 0 %
ALPHA 1 URINE: 0 %
Albumin, U: 0 %
Alpha 2, Urine: 0 %
Free Kappa Lt Chains,Ur: 5.46 mg/L (ref 1.17–86.46)
Free Kappa/Lambda Ratio: 4.83 (ref 1.83–14.26)
Free Lambda Lt Chains,Ur: 1.13 mg/L (ref 0.27–15.21)
GAMMA GLOBULIN URINE: 0 %
Total Protein, Urine-Ur/day: 133 mg/24 hr (ref 30–150)
Total Protein, Urine: 4.3 mg/dL
Total Volume: 3100

## 2022-07-29 ENCOUNTER — Encounter: Payer: Self-pay | Admitting: *Deleted

## 2022-08-05 ENCOUNTER — Inpatient Hospital Stay: Payer: Medicare Other

## 2022-08-05 VITALS — BP 146/86 | HR 51 | Temp 97.5°F | Resp 18

## 2022-08-05 DIAGNOSIS — Z5112 Encounter for antineoplastic immunotherapy: Secondary | ICD-10-CM | POA: Diagnosis not present

## 2022-08-05 DIAGNOSIS — C9000 Multiple myeloma not having achieved remission: Secondary | ICD-10-CM | POA: Diagnosis not present

## 2022-08-05 DIAGNOSIS — Z9484 Stem cells transplant status: Secondary | ICD-10-CM | POA: Diagnosis not present

## 2022-08-05 DIAGNOSIS — C9001 Multiple myeloma in remission: Secondary | ICD-10-CM

## 2022-08-05 LAB — CBC WITH DIFFERENTIAL (CANCER CENTER ONLY)
Abs Immature Granulocytes: 0.01 10*3/uL (ref 0.00–0.07)
Basophils Absolute: 0 10*3/uL (ref 0.0–0.1)
Basophils Relative: 1 %
Eosinophils Absolute: 0.1 10*3/uL (ref 0.0–0.5)
Eosinophils Relative: 1 %
HCT: 39.8 % (ref 39.0–52.0)
Hemoglobin: 14.4 g/dL (ref 13.0–17.0)
Immature Granulocytes: 0 %
Lymphocytes Relative: 29 %
Lymphs Abs: 1.3 10*3/uL (ref 0.7–4.0)
MCH: 35.4 pg — ABNORMAL HIGH (ref 26.0–34.0)
MCHC: 36.2 g/dL — ABNORMAL HIGH (ref 30.0–36.0)
MCV: 97.8 fL (ref 80.0–100.0)
Monocytes Absolute: 0.3 10*3/uL (ref 0.1–1.0)
Monocytes Relative: 7 %
Neutro Abs: 2.8 10*3/uL (ref 1.7–7.7)
Neutrophils Relative %: 62 %
Platelet Count: 159 10*3/uL (ref 150–400)
RBC: 4.07 MIL/uL — ABNORMAL LOW (ref 4.22–5.81)
RDW: 11.9 % (ref 11.5–15.5)
WBC Count: 4.4 10*3/uL (ref 4.0–10.5)
nRBC: 0 % (ref 0.0–0.2)

## 2022-08-05 LAB — CMP (CANCER CENTER ONLY)
ALT: 35 U/L (ref 0–44)
AST: 24 U/L (ref 15–41)
Albumin: 4.4 g/dL (ref 3.5–5.0)
Alkaline Phosphatase: 58 U/L (ref 38–126)
Anion gap: 7 (ref 5–15)
BUN: 16 mg/dL (ref 6–20)
CO2: 24 mmol/L (ref 22–32)
Calcium: 8.9 mg/dL (ref 8.9–10.3)
Chloride: 106 mmol/L (ref 98–111)
Creatinine: 1.4 mg/dL — ABNORMAL HIGH (ref 0.61–1.24)
GFR, Estimated: 59 mL/min — ABNORMAL LOW (ref >60)
Glucose, Bld: 89 mg/dL (ref 70–99)
Potassium: 4.4 mmol/L (ref 3.5–5.1)
Sodium: 137 mmol/L (ref 135–145)
Total Bilirubin: 0.6 mg/dL (ref 0.3–1.2)
Total Protein: 7.1 g/dL (ref 6.5–8.1)

## 2022-08-05 MED ORDER — PROCHLORPERAZINE MALEATE 10 MG PO TABS: 10.0000 mg | ORAL_TABLET | Freq: Once | ORAL | Status: DC

## 2022-08-05 MED ORDER — BORTEZOMIB CHEMO SQ INJECTION 3.5 MG (2.5MG/ML)
1.3000 mg/m2 | Freq: Once | INTRAMUSCULAR | Status: AC
  Administered 2022-08-05: 2.75 mg via SUBCUTANEOUS
  Filled 2022-08-05: qty 1.1

## 2022-08-05 NOTE — Patient Instructions (Signed)
Bortezomib Injection What is this medication? BORTEZOMIB (bor TEZ oh mib) treats lymphoma. It may also be used to treat multiple myeloma, a type of bone marrow cancer. It works by blocking a protein that causes cancer cells to grow and multiply. This helps to slow or stop the spread of cancer cells. This medicine may be used for other purposes; ask your health care provider or pharmacist if you have questions. COMMON BRAND NAME(S): Velcade What should I tell my care team before I take this medication? They need to know if you have any of these conditions: Dehydration Diabetes Heart disease Liver disease Tingling of the fingers or toes or other nerve disorder An unusual or allergic reaction to bortezomib, other medications, foods, dyes, or preservatives If you or your partner are pregnant or trying to get pregnant Breastfeeding How should I use this medication? This medication is injected into a vein or under the skin. It is given by your care team in a hospital or clinic setting. Talk to your care team about the use of this medication in children. Special care may be needed. Overdosage: If you think you have taken too much of this medicine contact a poison control center or emergency room at once. NOTE: This medicine is only for you. Do not share this medicine with others. What if I miss a dose? Keep appointments for follow-up doses. It is important not to miss your dose. Call your care team if you are unable to keep an appointment. What may interact with this medication? Ketoconazole Rifampin This list may not describe all possible interactions. Give your health care provider a list of all the medicines, herbs, non-prescription drugs, or dietary supplements you use. Also tell them if you smoke, drink alcohol, or use illegal drugs. Some items may interact with your medicine. What should I watch for while using this medication? Your condition will be monitored carefully while you are  receiving this medication. You may need blood work while taking this medication. This medication may affect your coordination, reaction time, or judgment. Do not drive or operate machinery until you know how this medication affects you. Sit up or stand slowly to reduce the risk of dizzy or fainting spells. Drinking alcohol with this medication can increase the risk of these side effects. This medication may increase your risk of getting an infection. Call your care team for advice if you get a fever, chills, sore throat, or other symptoms of a cold or flu. Do not treat yourself. Try to avoid being around people who are sick. Check with your care team if you have severe diarrhea, nausea, and vomiting, or if you sweat a lot. The loss of too much body fluid may make it dangerous for you to take this medication. Talk to your care team if you may be pregnant. Serious birth defects can occur if you take this medication during pregnancy and for 7 months after the last dose. You will need a negative pregnancy test before starting this medication. Contraception is recommended while taking this medication and for 7 months after the last dose. Your care team can help you find the option that works for you. If your partner can get pregnant, use a condom during sex while taking this medication and for 4 months after the last dose. Do not breastfeed while taking this medication and for 2 months after the last dose. This medication may cause infertility. Talk to your care team if you are concerned about your fertility. What side effects   may I notice from receiving this medication? Side effects that you should report to your care team as soon as possible: Allergic reactions--skin rash, itching, hives, swelling of the face, lips, tongue, or throat Bleeding--bloody or black, tar-like stools, vomiting blood or brown material that looks like coffee grounds, red or dark brown urine, small red or purple spots on skin, unusual  bruising or bleeding Bleeding in the brain--severe headache, stiff neck, confusion, dizziness, change in vision, numbness or weakness of the face, arm, or leg, trouble speaking, trouble walking, vomiting Bowel blockage--stomach cramping, unable to have a bowel movement or pass gas, loss of appetite, vomiting Heart failure--shortness of breath, swelling of the ankles, feet, or hands, sudden weight gain, unusual weakness or fatigue Infection--fever, chills, cough, sore throat, wounds that don't heal, pain or trouble when passing urine, general feeling of discomfort or being unwell Liver injury--right upper belly pain, loss of appetite, nausea, light-colored stool, dark yellow or brown urine, yellowing skin or eyes, unusual weakness or fatigue Low blood pressure--dizziness, feeling faint or lightheaded, blurry vision Lung injury--shortness of breath or trouble breathing, cough, spitting up blood, chest pain, fever Pain, tingling, or numbness in the hands or feet Severe or prolonged diarrhea Stomach pain, bloody diarrhea, pale skin, unusual weakness or fatigue, decrease in the amount of urine, which may be signs of hemolytic uremic syndrome Sudden and severe headache, confusion, change in vision, seizures, which may be signs of posterior reversible encephalopathy syndrome (PRES) TTP--purple spots on the skin or inside the mouth, pale skin, yellowing skin or eyes, unusual weakness or fatigue, fever, fast or irregular heartbeat, confusion, change in vision, trouble speaking, trouble walking Tumor lysis syndrome (TLS)--nausea, vomiting, diarrhea, decrease in the amount of urine, dark urine, unusual weakness or fatigue, confusion, muscle pain or cramps, fast or irregular heartbeat, joint pain Side effects that usually do not require medical attention (report to your care team if they continue or are bothersome): Constipation Diarrhea Fatigue Loss of appetite Nausea This list may not describe all possible  side effects. Call your doctor for medical advice about side effects. You may report side effects to FDA at 1-800-FDA-1088. Where should I keep my medication? This medication is given in a hospital or clinic. It will not be stored at home. NOTE: This sheet is a summary. It may not cover all possible information. If you have questions about this medicine, talk to your doctor, pharmacist, or health care provider.  2023 Elsevier/Gold Standard (2021-09-02 00:00:00)  

## 2022-08-06 ENCOUNTER — Other Ambulatory Visit: Payer: Self-pay

## 2022-08-19 ENCOUNTER — Inpatient Hospital Stay: Payer: Medicare Other

## 2022-08-20 ENCOUNTER — Inpatient Hospital Stay: Payer: Medicare Other | Attending: Hematology & Oncology

## 2022-08-20 ENCOUNTER — Inpatient Hospital Stay: Payer: Medicare Other

## 2022-08-20 VITALS — BP 143/97 | HR 66 | Temp 97.9°F | Resp 17

## 2022-08-20 DIAGNOSIS — Z5112 Encounter for antineoplastic immunotherapy: Secondary | ICD-10-CM | POA: Insufficient documentation

## 2022-08-20 DIAGNOSIS — C9001 Multiple myeloma in remission: Secondary | ICD-10-CM

## 2022-08-20 DIAGNOSIS — C9000 Multiple myeloma not having achieved remission: Secondary | ICD-10-CM | POA: Diagnosis not present

## 2022-08-20 LAB — CMP (CANCER CENTER ONLY)
ALT: 30 U/L (ref 0–44)
AST: 24 U/L (ref 15–41)
Albumin: 4.4 g/dL (ref 3.5–5.0)
Alkaline Phosphatase: 58 U/L (ref 38–126)
Anion gap: 8 (ref 5–15)
BUN: 17 mg/dL (ref 6–20)
CO2: 26 mmol/L (ref 22–32)
Calcium: 8.9 mg/dL (ref 8.9–10.3)
Chloride: 105 mmol/L (ref 98–111)
Creatinine: 1.47 mg/dL — ABNORMAL HIGH (ref 0.61–1.24)
GFR, Estimated: 55 mL/min — ABNORMAL LOW (ref >60)
Glucose, Bld: 102 mg/dL — ABNORMAL HIGH (ref 70–99)
Potassium: 4 mmol/L (ref 3.5–5.1)
Sodium: 139 mmol/L (ref 135–145)
Total Bilirubin: 0.8 mg/dL (ref 0.3–1.2)
Total Protein: 7.4 g/dL (ref 6.5–8.1)

## 2022-08-20 LAB — CBC WITH DIFFERENTIAL (CANCER CENTER ONLY)
Abs Immature Granulocytes: 0 10*3/uL (ref 0.00–0.07)
Basophils Absolute: 0 10*3/uL (ref 0.0–0.1)
Basophils Relative: 0 %
Eosinophils Absolute: 0.1 10*3/uL (ref 0.0–0.5)
Eosinophils Relative: 1 %
HCT: 41.3 % (ref 39.0–52.0)
Hemoglobin: 14.8 g/dL (ref 13.0–17.0)
Immature Granulocytes: 0 %
Lymphocytes Relative: 26 %
Lymphs Abs: 1.4 10*3/uL (ref 0.7–4.0)
MCH: 35.2 pg — ABNORMAL HIGH (ref 26.0–34.0)
MCHC: 35.8 g/dL (ref 30.0–36.0)
MCV: 98.3 fL (ref 80.0–100.0)
Monocytes Absolute: 0.4 10*3/uL (ref 0.1–1.0)
Monocytes Relative: 7 %
Neutro Abs: 3.4 10*3/uL (ref 1.7–7.7)
Neutrophils Relative %: 66 %
Platelet Count: 148 10*3/uL — ABNORMAL LOW (ref 150–400)
RBC: 4.2 MIL/uL — ABNORMAL LOW (ref 4.22–5.81)
RDW: 12.2 % (ref 11.5–15.5)
WBC Count: 5.3 10*3/uL (ref 4.0–10.5)
nRBC: 0 % (ref 0.0–0.2)

## 2022-08-20 MED ORDER — PROCHLORPERAZINE MALEATE 10 MG PO TABS: 10.0000 mg | ORAL_TABLET | Freq: Once | ORAL | Status: DC

## 2022-08-20 MED ORDER — BORTEZOMIB CHEMO SQ INJECTION 3.5 MG (2.5MG/ML)
1.3000 mg/m2 | Freq: Once | INTRAMUSCULAR | Status: AC
  Administered 2022-08-20: 2.75 mg via SUBCUTANEOUS
  Filled 2022-08-20: qty 1.1

## 2022-09-01 ENCOUNTER — Other Ambulatory Visit: Payer: Self-pay

## 2022-09-02 ENCOUNTER — Inpatient Hospital Stay: Payer: Medicare Other

## 2022-09-02 ENCOUNTER — Inpatient Hospital Stay: Payer: Medicare Other | Admitting: Medical Oncology

## 2022-09-03 ENCOUNTER — Other Ambulatory Visit: Payer: Self-pay | Admitting: Family

## 2022-09-03 DIAGNOSIS — C9000 Multiple myeloma not having achieved remission: Secondary | ICD-10-CM

## 2022-09-03 DIAGNOSIS — C9001 Multiple myeloma in remission: Secondary | ICD-10-CM

## 2022-09-06 ENCOUNTER — Encounter: Payer: Self-pay | Admitting: Family

## 2022-09-06 ENCOUNTER — Inpatient Hospital Stay: Payer: Medicare Other

## 2022-09-06 ENCOUNTER — Inpatient Hospital Stay (HOSPITAL_BASED_OUTPATIENT_CLINIC_OR_DEPARTMENT_OTHER): Payer: Medicare Other | Admitting: Family

## 2022-09-06 DIAGNOSIS — C9000 Multiple myeloma not having achieved remission: Secondary | ICD-10-CM

## 2022-09-06 DIAGNOSIS — C9001 Multiple myeloma in remission: Secondary | ICD-10-CM

## 2022-09-06 DIAGNOSIS — Z5112 Encounter for antineoplastic immunotherapy: Secondary | ICD-10-CM | POA: Diagnosis not present

## 2022-09-06 LAB — CBC WITH DIFFERENTIAL (CANCER CENTER ONLY)
Abs Immature Granulocytes: 0.01 10*3/uL (ref 0.00–0.07)
Basophils Absolute: 0 10*3/uL (ref 0.0–0.1)
Basophils Relative: 0 %
Eosinophils Absolute: 0.1 10*3/uL (ref 0.0–0.5)
Eosinophils Relative: 2 %
HCT: 40.8 % (ref 39.0–52.0)
Hemoglobin: 14.4 g/dL (ref 13.0–17.0)
Immature Granulocytes: 0 %
Lymphocytes Relative: 23 %
Lymphs Abs: 1 10*3/uL (ref 0.7–4.0)
MCH: 35.2 pg — ABNORMAL HIGH (ref 26.0–34.0)
MCHC: 35.3 g/dL (ref 30.0–36.0)
MCV: 99.8 fL (ref 80.0–100.0)
Monocytes Absolute: 0.3 10*3/uL (ref 0.1–1.0)
Monocytes Relative: 6 %
Neutro Abs: 3 10*3/uL (ref 1.7–7.7)
Neutrophils Relative %: 69 %
Platelet Count: 139 10*3/uL — ABNORMAL LOW (ref 150–400)
RBC: 4.09 MIL/uL — ABNORMAL LOW (ref 4.22–5.81)
RDW: 12.1 % (ref 11.5–15.5)
WBC Count: 4.4 10*3/uL (ref 4.0–10.5)
nRBC: 0 % (ref 0.0–0.2)

## 2022-09-06 LAB — LACTATE DEHYDROGENASE: LDH: 147 U/L (ref 98–192)

## 2022-09-06 LAB — CMP (CANCER CENTER ONLY)
ALT: 34 U/L (ref 0–44)
AST: 23 U/L (ref 15–41)
Albumin: 4.6 g/dL (ref 3.5–5.0)
Alkaline Phosphatase: 53 U/L (ref 38–126)
Anion gap: 7 (ref 5–15)
BUN: 14 mg/dL (ref 6–20)
CO2: 28 mmol/L (ref 22–32)
Calcium: 9.6 mg/dL (ref 8.9–10.3)
Chloride: 105 mmol/L (ref 98–111)
Creatinine: 1.42 mg/dL — ABNORMAL HIGH (ref 0.61–1.24)
GFR, Estimated: 58 mL/min — ABNORMAL LOW (ref >60)
Glucose, Bld: 121 mg/dL — ABNORMAL HIGH (ref 70–99)
Potassium: 4.6 mmol/L (ref 3.5–5.1)
Sodium: 140 mmol/L (ref 135–145)
Total Bilirubin: 0.7 mg/dL (ref 0.3–1.2)
Total Protein: 7 g/dL (ref 6.5–8.1)

## 2022-09-06 MED ORDER — PROCHLORPERAZINE MALEATE 10 MG PO TABS: 10.0000 mg | ORAL_TABLET | Freq: Once | ORAL | Status: DC

## 2022-09-06 MED ORDER — BORTEZOMIB CHEMO SQ INJECTION 3.5 MG (2.5MG/ML)
1.3000 mg/m2 | Freq: Once | INTRAMUSCULAR | Status: AC
  Administered 2022-09-06: 2.75 mg via SUBCUTANEOUS
  Filled 2022-09-06: qty 1.1

## 2022-09-06 NOTE — Patient Instructions (Signed)
[facility]  Discharge Instructions: Thank you for choosing [facility] to provide your oncology and hematology care.   If you have a lab appointment with the Cancer Center, please go directly to the Cancer Center and check in at the registration area.  Wear comfortable clothing and clothing appropriate for easy access to any Portacath or PICC line.   We strive to give you quality time with your provider. You may need to reschedule your appointment if you arrive late (15 or more minutes).  Arriving late affects you and other patients whose appointments are after yours.  Also, if you miss three or more appointments without notifying the office, you may be dismissed from the clinic at the provider's discretion.      For prescription refill requests, have your pharmacy contact our office and allow 72 hours for refills to be completed.    Today you received the following chemotherapy and/or immunotherapy agents Velcade.      To help prevent nausea and vomiting after your treatment, we encourage you to take your nausea medication as directed.  BELOW ARE SYMPTOMS THAT SHOULD BE REPORTED IMMEDIATELY: *FEVER GREATER THAN 100.4 F (38 C) OR HIGHER *CHILLS OR SWEATING *NAUSEA AND VOMITING THAT IS NOT CONTROLLED WITH YOUR NAUSEA MEDICATION *UNUSUAL SHORTNESS OF BREATH *UNUSUAL BRUISING OR BLEEDING *URINARY PROBLEMS (pain or burning when urinating, or frequent urination) *BOWEL PROBLEMS (unusual diarrhea, constipation, pain near the anus) TENDERNESS IN MOUTH AND THROAT WITH OR WITHOUT PRESENCE OF ULCERS (sore throat, sores in mouth, or a toothache) UNUSUAL RASH, SWELLING OR PAIN  UNUSUAL VAGINAL DISCHARGE OR ITCHING   Items with * indicate a potential emergency and should be followed up as soon as possible or go to the Emergency Department if any problems should occur.  Please show the CHEMOTHERAPY ALERT CARD or IMMUNOTHERAPY ALERT CARD at check-in  to the Emergency Department and triage nurse. Should you have questions after your visit or need to cancel or reschedule your appointment, please contact [facility]  [phone] and follow the prompts.  Office hours are 8:00 a.m. to 4:30 p.m. Monday - Friday. Please note that voicemails left after 4:00 p.m. may not be returned until the following business day.  We are closed weekends and major holidays. You have access to a nurse at all times for urgent questions. Please call the main number to the clinic [phone] and follow the prompts.  For any non-urgent questions, you may also contact your provider using MyChart. We now offer e-Visits for anyone 18 and older to request care online for non-urgent symptoms. For details visit [email or web address].   Also download the MyChart app! Go to the app store, search "MyChart", open the app, select [facility], and log in with your MyChart username and password.   

## 2022-09-06 NOTE — Progress Notes (Signed)
Hematology and Oncology Follow Up Visit  Tyler Phillips [id number] [birth year] 57 y.o. 09/06/2022   Principle Diagnosis:  IgG Kappa myeloma - +4, +14, +17   Past Therapy: Status post autologous stem cell transplant on 05/22/2015   S/p cycle 6 of RVD Ninlaro 4 mg po q 2 week -- start on 01/02/2019 -- d/c on 02/10/2019   Current Therapy:        Velcade q 2wk dosing Revlimid 10mg  po q day (21/7)  - d/c on 12/07/2021   Interim History:  [name] is here today for follow-up and treatment. He is doing well and staying active at the gym. He has no complaints at this time.  No M-spike observed in April. IgG level was 1,227 mg/dL and kappa light chains 2.08 mg/dL. He has had no issue with infections recently. No fever, chills, n/v, cough, rash, dizziness, SOB, chest pain, palpitations, abdominal pain or changes in bowel or bladder habits. ' No swelling in his extremities.  Neuropathy in the lower extremities unchanged from baseline.  No falls or syncope.  Appetite and hydration are good. Weight is stable at 219 lbs.   ECOG Performance Status: 1 - Symptomatic but completely ambulatory  Medications:  Allergies as of 09/06/2022   No Known Allergies      Medication List        Accurate as of Sep 06, 2022  9:59 AM. If you have any questions, ask your nurse or doctor.          aspirin 325 MG tablet Take 325 mg by mouth daily.   azithromycin 500 MG tablet Commonly known as: ZITHROMAX Take 500 mg by mouth daily as needed (For pre dental appointments).   bortezomib IV 3.5 MG injection Commonly known as: VELCADE 1 mg/m2 every 14 (fourteen) days.   cetirizine 10 MG tablet Commonly known as: ZYRTEC Take 10 mg by mouth daily.   multivitamin with minerals Tabs tablet Take 1 tablet by mouth daily.   ondansetron 4 MG tablet Commonly known as: ZOFRAN Take 1 tablet (4 mg total) by mouth every 6 (six) hours as needed for nausea.   valACYclovir 500 MG tablet Commonly known as:  VALTREX Take 500 mg by mouth 2 (two) times daily.   zolpidem 10 MG tablet Commonly known as: AMBIEN Take 1 tablet (10 mg total) by mouth at bedtime as needed for sleep.        Allergies: No Known Allergies  Past Medical History, Surgical history, Social history, and Family History were reviewed and updated.  Review of Systems: All other 10 point review of systems is negative.   Physical Exam:  vitals were not taken for this visit.   Wt Readings from Last 3 Encounters:  07/22/22 218 lb 0.6 oz (98.9 kg)  06/15/22 217 lb 1.9 oz (98.5 kg)  04/30/22 215 lb 1.9 oz (97.6 kg)    Ocular: Sclerae unicteric, pupils equal, round and reactive to light Ear-nose-throat: Oropharynx clear, dentition fair Lymphatic: No cervical or supraclavicular adenopathy Lungs no rales or rhonchi, good excursion bilaterally Heart regular rate and rhythm, no murmur appreciated Abd soft, nontender, positive bowel sounds MSK no focal spinal tenderness, no joint edema Neuro: non-focal, well-oriented, appropriate affect Breasts: Deferred  Lab Results  Component Value Date   WBC 4.4 09/06/2022   HGB 14.4 09/06/2022   HCT 40.8 09/06/2022   MCV 99.8 09/06/2022   PLT 139 (L) 09/06/2022   Lab Results  Component Value Date   FERRITIN 1,263 (H) 07/20/2014  IRON 125 07/20/2014   TIBC 198 (L) 07/20/2014   UIBC 73 (L) 07/20/2014   IRONPCTSAT 63 (H) 07/20/2014   Lab Results  Component Value Date   RETICCTPCT 0.8 07/20/2014   RBC 4.09 (L) 09/06/2022   Lab Results  Component Value Date   KPAFRELGTCHN 20.8 (H) 07/22/2022   LAMBDASER 18.7 07/22/2022   KAPLAMBRATIO 4.83 07/26/2022   Lab Results  Component Value Date   IGGSERUM 1,227 07/22/2022   IGA 174 07/22/2022   IGMSERUM 29 07/22/2022   Lab Results  Component Value Date   TOTALPROTELP 6.9 07/22/2022   ALBUMINELP 4.2 07/22/2022   A1GS 0.2 07/22/2022   A2GS 0.6 07/22/2022   BETS 0.9 07/22/2022   BETA2SER 0.3 03/28/2015   GAMS 1.2  07/22/2022   MSPIKE Not Observed 07/22/2022   SPEI Comment 04/30/2022     Chemistry      Component Value Date/Time   NA 139 08/20/2022 0855   NA 140 04/22/2017 1140   NA 138 09/03/2015 1042   K 4.0 08/20/2022 0855   K 4.0 04/22/2017 1140   K 4.3 09/03/2015 1042   CL 105 08/20/2022 0855   CL 105 04/22/2017 1140   CO2 26 08/20/2022 0855   CO2 24 04/22/2017 1140   CO2 25 09/03/2015 1042   BUN 17 08/20/2022 0855   BUN 16 04/22/2017 1140   BUN 21.7 09/03/2015 1042   CREATININE 1.47 (H) 08/20/2022 0855   CREATININE 1.6 (H) 04/22/2017 1140   CREATININE 1.2 09/03/2015 1042      Component Value Date/Time   CALCIUM 8.9 08/20/2022 0855   CALCIUM 8.5 04/22/2017 1140   CALCIUM 9.1 09/03/2015 1042   ALKPHOS 58 08/20/2022 0855   ALKPHOS 59 04/22/2017 1140   ALKPHOS 42 09/03/2015 1042   AST 24 08/20/2022 0855   AST 27 09/03/2015 1042   ALT 30 08/20/2022 0855   ALT 45 04/22/2017 1140   ALT 41 09/03/2015 1042   BILITOT 0.8 08/20/2022 0855   BILITOT 0.54 09/03/2015 1042       Impression and Plan: [name] is a very pleasant 57 yo caucasian gentleman with IgG kappa myeloma. He underwent induction chemotherapy with RVD followed by an autologous stem cell transplant at [facility] on February 2017.  We stopped the Revlimid back in August 2023 because of his blood counts being low and there was concern for him developing myelodysplasia. He continues to do well with every 2 weeks Velcade treatment.  We will proceed with treatment today as planned.  Follow-up in 6 weeks.   Eileen Stanford, NP 5/20/20249:59 AM

## 2022-09-07 LAB — KAPPA/LAMBDA LIGHT CHAINS
Kappa free light chain: 20.9 mg/L — ABNORMAL HIGH (ref 3.3–19.4)
Kappa, lambda light chain ratio: 1.08 (ref 0.26–1.65)
Lambda free light chains: 19.4 mg/L (ref 5.7–26.3)

## 2022-09-08 ENCOUNTER — Other Ambulatory Visit: Payer: Self-pay

## 2022-09-08 LAB — IGG, IGA, IGM
IgA: 166 mg/dL (ref 90–386)
IgG (Immunoglobin G), Serum: 1203 mg/dL (ref 603–1613)
IgM (Immunoglobulin M), Srm: 34 mg/dL (ref 20–172)

## 2022-09-09 LAB — PROTEIN ELECTROPHORESIS, SERUM
A/G Ratio: 1.3 (ref 0.7–1.7)
Albumin ELP: 3.9 g/dL (ref 2.9–4.4)
Alpha-1-Globulin: 0.2 g/dL (ref 0.0–0.4)
Alpha-2-Globulin: 0.6 g/dL (ref 0.4–1.0)
Beta Globulin: 0.9 g/dL (ref 0.7–1.3)
Gamma Globulin: 1.2 g/dL (ref 0.4–1.8)
Globulin, Total: 2.9 g/dL (ref 2.2–3.9)
Total Protein ELP: 6.8 g/dL (ref 6.0–8.5)

## 2022-09-20 ENCOUNTER — Ambulatory Visit: Payer: Medicare Other

## 2022-09-20 ENCOUNTER — Ambulatory Visit: Payer: Medicare Other | Admitting: Medical Oncology

## 2022-09-20 ENCOUNTER — Inpatient Hospital Stay: Payer: Medicare Other

## 2022-09-27 ENCOUNTER — Inpatient Hospital Stay: Payer: Medicare Other

## 2022-09-27 ENCOUNTER — Inpatient Hospital Stay (HOSPITAL_BASED_OUTPATIENT_CLINIC_OR_DEPARTMENT_OTHER): Payer: Medicare Other | Admitting: Medical Oncology

## 2022-09-27 ENCOUNTER — Other Ambulatory Visit: Payer: Medicare Other

## 2022-09-27 ENCOUNTER — Ambulatory Visit: Payer: Medicare Other

## 2022-09-27 ENCOUNTER — Ambulatory Visit: Payer: Medicare Other | Admitting: Family

## 2022-09-27 ENCOUNTER — Inpatient Hospital Stay: Payer: Medicare Other | Attending: Hematology & Oncology

## 2022-09-27 DIAGNOSIS — C9001 Multiple myeloma in remission: Secondary | ICD-10-CM

## 2022-09-27 DIAGNOSIS — Z5112 Encounter for antineoplastic immunotherapy: Secondary | ICD-10-CM | POA: Insufficient documentation

## 2022-09-27 DIAGNOSIS — C9000 Multiple myeloma not having achieved remission: Secondary | ICD-10-CM | POA: Insufficient documentation

## 2022-09-27 LAB — CMP (CANCER CENTER ONLY)
ALT: 33 U/L (ref 0–44)
AST: 22 U/L (ref 15–41)
Albumin: 4.6 g/dL (ref 3.5–5.0)
Alkaline Phosphatase: 56 U/L (ref 38–126)
Anion gap: 8 (ref 5–15)
BUN: 16 mg/dL (ref 6–20)
CO2: 26 mmol/L (ref 22–32)
Calcium: 8.9 mg/dL (ref 8.9–10.3)
Chloride: 105 mmol/L (ref 98–111)
Creatinine: 1.5 mg/dL — ABNORMAL HIGH (ref 0.61–1.24)
GFR, Estimated: 54 mL/min — ABNORMAL LOW (ref >60)
Glucose, Bld: 101 mg/dL — ABNORMAL HIGH (ref 70–99)
Potassium: 4.4 mmol/L (ref 3.5–5.1)
Sodium: 139 mmol/L (ref 135–145)
Total Bilirubin: 0.7 mg/dL (ref 0.3–1.2)
Total Protein: 7.3 g/dL (ref 6.5–8.1)

## 2022-09-27 LAB — CBC WITH DIFFERENTIAL (CANCER CENTER ONLY)
Abs Immature Granulocytes: 0.01 10*3/uL (ref 0.00–0.07)
Basophils Absolute: 0 10*3/uL (ref 0.0–0.1)
Basophils Relative: 0 %
Eosinophils Absolute: 0.1 10*3/uL (ref 0.0–0.5)
Eosinophils Relative: 2 %
HCT: 40.5 % (ref 39.0–52.0)
Hemoglobin: 14.4 g/dL (ref 13.0–17.0)
Immature Granulocytes: 0 %
Lymphocytes Relative: 24 %
Lymphs Abs: 1.2 10*3/uL (ref 0.7–4.0)
MCH: 35.2 pg — ABNORMAL HIGH (ref 26.0–34.0)
MCHC: 35.6 g/dL (ref 30.0–36.0)
MCV: 99 fL (ref 80.0–100.0)
Monocytes Absolute: 0.4 10*3/uL (ref 0.1–1.0)
Monocytes Relative: 7 %
Neutro Abs: 3.1 10*3/uL (ref 1.7–7.7)
Neutrophils Relative %: 67 %
Platelet Count: 134 10*3/uL — ABNORMAL LOW (ref 150–400)
RBC: 4.09 MIL/uL — ABNORMAL LOW (ref 4.22–5.81)
RDW: 12.1 % (ref 11.5–15.5)
WBC Count: 4.7 10*3/uL (ref 4.0–10.5)
nRBC: 0 % (ref 0.0–0.2)

## 2022-09-27 LAB — LACTATE DEHYDROGENASE: LDH: 154 U/L (ref 98–192)

## 2022-09-27 MED ORDER — PROCHLORPERAZINE MALEATE 10 MG PO TABS: 10.0000 mg | ORAL_TABLET | Freq: Once | ORAL | Status: DC

## 2022-09-27 MED ORDER — BORTEZOMIB CHEMO SQ INJECTION 3.5 MG (2.5MG/ML)
1.3000 mg/m2 | Freq: Once | INTRAMUSCULAR | Status: AC
  Administered 2022-09-27: 2.75 mg via SUBCUTANEOUS
  Filled 2022-09-27: qty 1.1

## 2022-09-27 NOTE — Patient Instructions (Signed)
[facility]  Discharge Instructions: Thank you for choosing [facility] to provide your oncology and hematology care.   If you have a lab appointment with the Cancer Center, please go directly to the Cancer Center and check in at the registration area.  Wear comfortable clothing and clothing appropriate for easy access to any Portacath or PICC line.   We strive to give you quality time with your provider. You may need to reschedule your appointment if you arrive late (15 or more minutes).  Arriving late affects you and other patients whose appointments are after yours.  Also, if you miss three or more appointments without notifying the office, you may be dismissed from the clinic at the provider's discretion.      For prescription refill requests, have your pharmacy contact our office and allow 72 hours for refills to be completed.    Today you received the following chemotherapy and/or immunotherapy agents Velcade.      To help prevent nausea and vomiting after your treatment, we encourage you to take your nausea medication as directed.  BELOW ARE SYMPTOMS THAT SHOULD BE REPORTED IMMEDIATELY: *FEVER GREATER THAN 100.4 F (38 C) OR HIGHER *CHILLS OR SWEATING *NAUSEA AND VOMITING THAT IS NOT CONTROLLED WITH YOUR NAUSEA MEDICATION *UNUSUAL SHORTNESS OF BREATH *UNUSUAL BRUISING OR BLEEDING *URINARY PROBLEMS (pain or burning when urinating, or frequent urination) *BOWEL PROBLEMS (unusual diarrhea, constipation, pain near the anus) TENDERNESS IN MOUTH AND THROAT WITH OR WITHOUT PRESENCE OF ULCERS (sore throat, sores in mouth, or a toothache) UNUSUAL RASH, SWELLING OR PAIN  UNUSUAL VAGINAL DISCHARGE OR ITCHING   Items with * indicate a potential emergency and should be followed up as soon as possible or go to the Emergency Department if any problems should occur.  Please show the CHEMOTHERAPY ALERT CARD or IMMUNOTHERAPY ALERT CARD at check-in  to the Emergency Department and triage nurse. Should you have questions after your visit or need to cancel or reschedule your appointment, please contact [facility]  [phone] and follow the prompts.  Office hours are 8:00 a.m. to 4:30 p.m. Monday - Friday. Please note that voicemails left after 4:00 p.m. may not be returned until the following business day.  We are closed weekends and major holidays. You have access to a nurse at all times for urgent questions. Please call the main number to the clinic [phone] and follow the prompts.  For any non-urgent questions, you may also contact your provider using MyChart. We now offer e-Visits for anyone 18 and older to request care online for non-urgent symptoms. For details visit [email or web address].   Also download the MyChart app! Go to the app store, search "MyChart", open the app, select [facility], and log in with your MyChart username and password.   

## 2022-09-27 NOTE — Progress Notes (Signed)
Ok to treat with creatinine 1.5 per Clent Jacks, PA. dph

## 2022-09-27 NOTE — Progress Notes (Signed)
Hematology and Oncology Follow Up Visit  Tyler Phillips [id number] 25-Jan-1966 57 y.o. 09/27/2022   Principle Diagnosis:  IgG Kappa myeloma - +4, +14, +17   Past Therapy: Status post autologous stem cell transplant on 05/22/2015   S/p cycle 6 of RVD Ninlaro 4 mg po q 2 week -- start on 01/02/2019 -- d/c on 02/10/2019   Current Therapy:        Velcade q 2wk dosing Revlimid 10mg  po q day (21/7)  - d/c on 12/07/2021   Interim History:  [name] is here today for follow-up and treatment. He is doing well and staying active at the gym. He recently got back from a trip to [place]. He had a good time there. He has no complaints at this time.   No M-spike observed in May IgG level was 1,203 mg/dL and kappa light chains 2.09 mg/dL. He has had no issue with infections recently. No fever, chills, n/v, cough, rash, dizziness, SOB, chest pain, palpitations, abdominal pain or changes in bowel or bladder habits. Was stung by 3 yellow jackets yesterday but feeling well.  No swelling in his extremities.  Neuropathy in the lower extremities unchanged from baseline.  No falls or syncope.  Appetite and hydration are good. Weight is stable  Wt Readings from Last 3 Encounters:  09/27/22 218 lb (98.9 kg)  09/06/22 219 lb 1.9 oz (99.4 kg)  07/22/22 218 lb 0.6 oz (98.9 kg)      ECOG Performance Status: 1 - Symptomatic but completely ambulatory  Medications:  Allergies as of 09/27/2022   No Known Allergies      Medication List        Accurate as of September 27, 2022 10:10 AM. If you have any questions, ask your nurse or doctor.          aspirin 325 MG tablet Take 325 mg by mouth daily.   azithromycin 500 MG tablet Commonly known as: ZITHROMAX Take 500 mg by mouth daily as needed (For pre dental appointments).   bortezomib IV 3.5 MG injection Commonly known as: VELCADE 1 mg/m2 every 14 (fourteen) days.   cetirizine 10 MG tablet Commonly known as: ZYRTEC Take 10 mg by mouth  daily.   multivitamin with minerals Tabs tablet Take 1 tablet by mouth daily.   ondansetron 4 MG tablet Commonly known as: ZOFRAN Take 1 tablet (4 mg total) by mouth every 6 (six) hours as needed for nausea.   valACYclovir 500 MG tablet Commonly known as: VALTREX Take 500 mg by mouth 2 (two) times daily.   zolpidem 10 MG tablet Commonly known as: AMBIEN Take 1 tablet (10 mg total) by mouth at bedtime as needed for sleep.        Allergies: No Known Allergies  Past Medical History, Surgical history, Social history, and Family History were reviewed and updated.  Review of Systems: All other 10 point review of systems is negative.   Physical Exam:  height is 6\' 1"  (1.854 m) and weight is 218 lb (98.9 kg). His oral temperature is 97.4 F (36.3 C) (abnormal). His blood pressure is 150/100 (abnormal) and his pulse is 60. His respiration is 17 and oxygen saturation is 100%.   Wt Readings from Last 3 Encounters:  09/27/22 218 lb (98.9 kg)  09/06/22 219 lb 1.9 oz (99.4 kg)  07/22/22 218 lb 0.6 oz (98.9 kg)    Ocular: Sclerae unicteric, pupils equal, round and reactive to light Ear-nose-throat: Oropharynx clear, dentition fair Lymphatic: No cervical or  supraclavicular adenopathy Lungs no rales or rhonchi, good excursion bilaterally Heart regular rate and rhythm, no murmur appreciated Abd soft, nontender, positive bowel sounds MSK no focal spinal tenderness, no joint edema Neuro: non-focal, well-oriented, appropriate affect  Lab Results  Component Value Date   WBC 4.7 09/27/2022   HGB 14.4 09/27/2022   HCT 40.5 09/27/2022   MCV 99.0 09/27/2022   PLT 134 (L) 09/27/2022   Lab Results  Component Value Date   FERRITIN 1,263 (H) 07/20/2014   IRON 125 07/20/2014   TIBC 198 (L) 07/20/2014   UIBC 73 (L) 07/20/2014   IRONPCTSAT 63 (H) 07/20/2014   Lab Results  Component Value Date   RETICCTPCT 0.8 07/20/2014   RBC 4.09 (L) 09/27/2022   Lab Results  Component Value Date    KPAFRELGTCHN 20.9 (H) 09/06/2022   LAMBDASER 19.4 09/06/2022   KAPLAMBRATIO 1.08 09/06/2022   Lab Results  Component Value Date   IGGSERUM 1,203 09/06/2022   IGA 166 09/06/2022   IGMSERUM 34 09/06/2022   Lab Results  Component Value Date   TOTALPROTELP 6.8 09/06/2022   ALBUMINELP 3.9 09/06/2022   A1GS 0.2 09/06/2022   A2GS 0.6 09/06/2022   BETS 0.9 09/06/2022   BETA2SER 0.3 03/28/2015   GAMS 1.2 09/06/2022   MSPIKE Not Observed 09/06/2022   SPEI Comment 09/06/2022     Chemistry      Component Value Date/Time   NA 140 09/06/2022 0933   NA 140 04/22/2017 1140   NA 138 09/03/2015 1042   K 4.6 09/06/2022 0933   K 4.0 04/22/2017 1140   K 4.3 09/03/2015 1042   CL 105 09/06/2022 0933   CL 105 04/22/2017 1140   CO2 28 09/06/2022 0933   CO2 24 04/22/2017 1140   CO2 25 09/03/2015 1042   BUN 14 09/06/2022 0933   BUN 16 04/22/2017 1140   BUN 21.7 09/03/2015 1042   CREATININE 1.42 (H) 09/06/2022 0933   CREATININE 1.6 (H) 04/22/2017 1140   CREATININE 1.2 09/03/2015 1042      Component Value Date/Time   CALCIUM 9.6 09/06/2022 0933   CALCIUM 8.5 04/22/2017 1140   CALCIUM 9.1 09/03/2015 1042   ALKPHOS 53 09/06/2022 0933   ALKPHOS 59 04/22/2017 1140   ALKPHOS 42 09/03/2015 1042   AST 23 09/06/2022 0933   AST 27 09/03/2015 1042   ALT 34 09/06/2022 0933   ALT 45 04/22/2017 1140   ALT 41 09/03/2015 1042   BILITOT 0.7 09/06/2022 0933   BILITOT 0.54 09/03/2015 1042       Impression and Plan: [name] is a very pleasant 57 yo caucasian gentleman with IgG kappa myeloma. He underwent induction chemotherapy with RVD followed by an autologous stem cell transplant at [facility] on February 2017.  We stopped the Revlimid back in August 2023 because of his blood counts being low and there was concern for him developing myelodysplasia.  He continues to do well with every 2 weeks maintenance Velcade treatment.  CBC/CMP and vitals reviewed and acceptable for treatment.  We will proceed  with treatment today as planned.  Has follow up planned and scheduled. No new appointments needed at this time.    Tyler Chestnut, PA-C 6/10/202410:10 AM

## 2022-09-28 LAB — IGG, IGA, IGM
IgA: 177 mg/dL (ref 90–386)
IgG (Immunoglobin G), Serum: 1210 mg/dL (ref 603–1613)
IgM (Immunoglobulin M), Srm: 32 mg/dL (ref 20–172)

## 2022-09-29 LAB — KAPPA/LAMBDA LIGHT CHAINS
Kappa free light chain: 21.6 mg/L — ABNORMAL HIGH (ref 3.3–19.4)
Kappa, lambda light chain ratio: 1.06 (ref 0.26–1.65)
Lambda free light chains: 20.3 mg/L (ref 5.7–26.3)

## 2022-09-30 LAB — PROTEIN ELECTROPHORESIS, SERUM
A/G Ratio: 1.5 (ref 0.7–1.7)
Albumin ELP: 4.1 g/dL (ref 2.9–4.4)
Alpha-1-Globulin: 0.1 g/dL (ref 0.0–0.4)
Alpha-2-Globulin: 0.6 g/dL (ref 0.4–1.0)
Beta Globulin: 0.9 g/dL (ref 0.7–1.3)
Gamma Globulin: 1.1 g/dL (ref 0.4–1.8)
Globulin, Total: 2.8 g/dL (ref 2.2–3.9)
Total Protein ELP: 6.9 g/dL (ref 6.0–8.5)

## 2022-10-11 ENCOUNTER — Inpatient Hospital Stay: Payer: Medicare Other

## 2022-10-19 ENCOUNTER — Inpatient Hospital Stay: Payer: Medicare Other

## 2022-10-19 ENCOUNTER — Encounter: Payer: Self-pay | Admitting: Hematology & Oncology

## 2022-10-19 ENCOUNTER — Inpatient Hospital Stay (HOSPITAL_BASED_OUTPATIENT_CLINIC_OR_DEPARTMENT_OTHER): Payer: Medicare Other | Admitting: Hematology & Oncology

## 2022-10-19 ENCOUNTER — Inpatient Hospital Stay: Payer: Medicare Other | Attending: Hematology & Oncology

## 2022-10-19 ENCOUNTER — Other Ambulatory Visit: Payer: Self-pay

## 2022-10-19 VITALS — BP 178/98 | HR 54 | Resp 17

## 2022-10-19 VITALS — BP 137/86 | HR 54 | Temp 98.4°F | Resp 18 | Ht 73.0 in | Wt 219.0 lb

## 2022-10-19 DIAGNOSIS — Z5112 Encounter for antineoplastic immunotherapy: Secondary | ICD-10-CM | POA: Insufficient documentation

## 2022-10-19 DIAGNOSIS — C9000 Multiple myeloma not having achieved remission: Secondary | ICD-10-CM | POA: Insufficient documentation

## 2022-10-19 DIAGNOSIS — C9001 Multiple myeloma in remission: Secondary | ICD-10-CM

## 2022-10-19 DIAGNOSIS — Z79899 Other long term (current) drug therapy: Secondary | ICD-10-CM | POA: Insufficient documentation

## 2022-10-19 LAB — CBC WITH DIFFERENTIAL (CANCER CENTER ONLY)
Abs Immature Granulocytes: 0.01 10*3/uL (ref 0.00–0.07)
Basophils Absolute: 0 10*3/uL (ref 0.0–0.1)
Basophils Relative: 0 %
Eosinophils Absolute: 0.1 10*3/uL (ref 0.0–0.5)
Eosinophils Relative: 1 %
HCT: 42 % (ref 39.0–52.0)
Hemoglobin: 14.8 g/dL (ref 13.0–17.0)
Immature Granulocytes: 0 %
Lymphocytes Relative: 26 %
Lymphs Abs: 1.2 10*3/uL (ref 0.7–4.0)
MCH: 35.1 pg — ABNORMAL HIGH (ref 26.0–34.0)
MCHC: 35.2 g/dL (ref 30.0–36.0)
MCV: 99.5 fL (ref 80.0–100.0)
Monocytes Absolute: 0.4 10*3/uL (ref 0.1–1.0)
Monocytes Relative: 8 %
Neutro Abs: 2.9 10*3/uL (ref 1.7–7.7)
Neutrophils Relative %: 65 %
Platelet Count: 140 10*3/uL — ABNORMAL LOW (ref 150–400)
RBC: 4.22 MIL/uL (ref 4.22–5.81)
RDW: 11.9 % (ref 11.5–15.5)
WBC Count: 4.5 10*3/uL (ref 4.0–10.5)
nRBC: 0 % (ref 0.0–0.2)

## 2022-10-19 LAB — CMP (CANCER CENTER ONLY)
ALT: 39 U/L (ref 0–44)
AST: 23 U/L (ref 15–41)
Albumin: 4.7 g/dL (ref 3.5–5.0)
Alkaline Phosphatase: 56 U/L (ref 38–126)
Anion gap: 7 (ref 5–15)
BUN: 19 mg/dL (ref 6–20)
CO2: 26 mmol/L (ref 22–32)
Calcium: 9.3 mg/dL (ref 8.9–10.3)
Chloride: 103 mmol/L (ref 98–111)
Creatinine: 1.56 mg/dL — ABNORMAL HIGH (ref 0.61–1.24)
GFR, Estimated: 51 mL/min — ABNORMAL LOW (ref >60)
Glucose, Bld: 100 mg/dL — ABNORMAL HIGH (ref 70–99)
Potassium: 4.6 mmol/L (ref 3.5–5.1)
Sodium: 136 mmol/L (ref 135–145)
Total Bilirubin: 0.8 mg/dL (ref 0.3–1.2)
Total Protein: 7.7 g/dL (ref 6.5–8.1)

## 2022-10-19 LAB — LACTATE DEHYDROGENASE: LDH: 150 U/L (ref 98–192)

## 2022-10-19 MED ORDER — PROCHLORPERAZINE MALEATE 10 MG PO TABS: 10.0000 mg | ORAL_TABLET | Freq: Once | ORAL | Status: DC

## 2022-10-19 MED ORDER — BORTEZOMIB CHEMO SQ INJECTION 3.5 MG (2.5MG/ML)
1.3000 mg/m2 | Freq: Once | INTRAMUSCULAR | Status: AC
  Administered 2022-10-19: 2.75 mg via SUBCUTANEOUS
  Filled 2022-10-19: qty 1.1

## 2022-10-19 NOTE — Patient Instructions (Signed)
Bortezomib Injection What is this medication? BORTEZOMIB (bor TEZ oh mib) treats lymphoma. It may also be used to treat multiple myeloma, a type of bone marrow cancer. It works by blocking a protein that causes cancer cells to grow and multiply. This helps to slow or stop the spread of cancer cells. This medicine may be used for other purposes; ask your health care provider or pharmacist if you have questions. COMMON BRAND NAME(S): Velcade What should I tell my care team before I take this medication? They need to know if you have any of these conditions: Dehydration Diabetes Heart disease Liver disease Tingling of the fingers or toes or other nerve disorder An unusual or allergic reaction to bortezomib, other medications, foods, dyes, or preservatives If you or your partner are pregnant or trying to get pregnant Breastfeeding How should I use this medication? This medication is injected into a vein or under the skin. It is given by your care team in a hospital or clinic setting. Talk to your care team about the use of this medication in children. Special care may be needed. Overdosage: If you think you have taken too much of this medicine contact a poison control center or emergency room at once. NOTE: This medicine is only for you. Do not share this medicine with others. What if I miss a dose? Keep appointments for follow-up doses. It is important not to miss your dose. Call your care team if you are unable to keep an appointment. What may interact with this medication? Ketoconazole Rifampin This list may not describe all possible interactions. Give your health care provider a list of all the medicines, herbs, non-prescription drugs, or dietary supplements you use. Also tell them if you smoke, drink alcohol, or use illegal drugs. Some items may interact with your medicine. What should I watch for while using this medication? Your condition will be monitored carefully while you are  receiving this medication. You may need blood work while taking this medication. This medication may affect your coordination, reaction time, or judgment. Do not drive or operate machinery until you know how this medication affects you. Sit up or stand slowly to reduce the risk of dizzy or fainting spells. Drinking alcohol with this medication can increase the risk of these side effects. This medication may increase your risk of getting an infection. Call your care team for advice if you get a fever, chills, sore throat, or other symptoms of a cold or flu. Do not treat yourself. Try to avoid being around people who are sick. Check with your care team if you have severe diarrhea, nausea, and vomiting, or if you sweat a lot. The loss of too much body fluid may make it dangerous for you to take this medication. Talk to your care team if you may be pregnant. Serious birth defects can occur if you take this medication during pregnancy and for 7 months after the last dose. You will need a negative pregnancy test before starting this medication. Contraception is recommended while taking this medication and for 7 months after the last dose. Your care team can help you find the option that works for you. If your partner can get pregnant, use a condom during sex while taking this medication and for 4 months after the last dose. Do not breastfeed while taking this medication and for 2 months after the last dose. This medication may cause infertility. Talk to your care team if you are concerned about your fertility. What side effects   may I notice from receiving this medication? Side effects that you should report to your care team as soon as possible: Allergic reactions--skin rash, itching, hives, swelling of the face, lips, tongue, or throat Bleeding--bloody or black, tar-like stools, vomiting blood or brown material that looks like coffee grounds, red or dark brown urine, small red or purple spots on skin, unusual  bruising or bleeding Bleeding in the brain--severe headache, stiff neck, confusion, dizziness, change in vision, numbness or weakness of the face, arm, or leg, trouble speaking, trouble walking, vomiting Bowel blockage--stomach cramping, unable to have a bowel movement or pass gas, loss of appetite, vomiting Heart failure--shortness of breath, swelling of the ankles, feet, or hands, sudden weight gain, unusual weakness or fatigue Infection--fever, chills, cough, sore throat, wounds that don't heal, pain or trouble when passing urine, general feeling of discomfort or being unwell Liver injury--right upper belly pain, loss of appetite, nausea, light-colored stool, dark yellow or brown urine, yellowing skin or eyes, unusual weakness or fatigue Low blood pressure--dizziness, feeling faint or lightheaded, blurry vision Lung injury--shortness of breath or trouble breathing, cough, spitting up blood, chest pain, fever Pain, tingling, or numbness in the hands or feet Severe or prolonged diarrhea Stomach pain, bloody diarrhea, pale skin, unusual weakness or fatigue, decrease in the amount of urine, which may be signs of hemolytic uremic syndrome Sudden and severe headache, confusion, change in vision, seizures, which may be signs of posterior reversible encephalopathy syndrome (PRES) TTP--purple spots on the skin or inside the mouth, pale skin, yellowing skin or eyes, unusual weakness or fatigue, fever, fast or irregular heartbeat, confusion, change in vision, trouble speaking, trouble walking Tumor lysis syndrome (TLS)--nausea, vomiting, diarrhea, decrease in the amount of urine, dark urine, unusual weakness or fatigue, confusion, muscle pain or cramps, fast or irregular heartbeat, joint pain Side effects that usually do not require medical attention (report to your care team if they continue or are bothersome): Constipation Diarrhea Fatigue Loss of appetite Nausea This list may not describe all possible  side effects. Call your doctor for medical advice about side effects. You may report side effects to FDA at 1-800-FDA-1088. Where should I keep my medication? This medication is given in a hospital or clinic. It will not be stored at home. NOTE: This sheet is a summary. It may not cover all possible information. If you have questions about this medicine, talk to your doctor, pharmacist, or health care provider.  2024 Elsevier/Gold Standard (2021-09-08 00:00:00)  

## 2022-10-19 NOTE — Progress Notes (Signed)
Hematology and Oncology Follow Up Visit  Tyler Phillips [id number] [birth year] 57 y.o. 10/19/2022   Principle Diagnosis:  IgG Kappa myeloma - +4, +14, +17   Past Therapy: Status post autologous stem cell transplant on 05/22/2015   S/p cycle 6 of RVD Ninlaro 4 mg po q 2 week -- start on 01/02/2019 -- d/c on 02/10/2019   Current Therapy:        Velcade q 2wk dosing Revlimid 10mg  po q day (21/7)  - d/c on 12/07/2021   Interim History:  Tyler Phillips is here today for follow-up and treatment.  So far, he was things going quite well.  As always, he and his wife are traveling.  They will be heading out to [place] I think soon.  Their family will be meeting them out there.  He and his wife are enjoying their grandchild.  She is growing up.  I think she is probably about 56 months old now.  He is doing well.  He is still very active physically.  He does a lot of exercise.  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  His myeloma is not a problem.  But we last saw him, there was no monoclonal spike in his blood.  His IgG level was 1210 mg/dL.  The Kappa light chain was 2.2 mg/dL.  He has had no cough or shortness of breath.  He has had no nausea or vomiting.  He has had no change in bowel or bladder habits.  Overall, I was his performance status is probably ECOG 0.   Medications:  Allergies as of 10/19/2022   No Known Allergies      Medication List        Accurate as of October 19, 2022  9:58 AM. If you have any questions, ask your nurse or doctor.          aspirin 325 MG tablet Take 325 mg by mouth daily.   azithromycin 500 MG tablet Commonly known as: ZITHROMAX Take 500 mg by mouth daily as needed (For pre dental appointments).   bortezomib IV 3.5 MG injection Commonly known as: VELCADE 1 mg/m2 every 14 (fourteen) days.   cetirizine 10 MG tablet Commonly known as: ZYRTEC Take 10 mg by mouth daily.   multivitamin with minerals Tabs tablet Take 1 tablet by mouth daily.    ondansetron 4 MG tablet Commonly known as: ZOFRAN Take 1 tablet (4 mg total) by mouth every 6 (six) hours as needed for nausea.   valACYclovir 500 MG tablet Commonly known as: VALTREX Take 500 mg by mouth 2 (two) times daily.   zolpidem 10 MG tablet Commonly known as: AMBIEN Take 1 tablet (10 mg total) by mouth at bedtime as needed for sleep.        Allergies: No Known Allergies  Past Medical History, Surgical history, Social history, and Family History were reviewed and updated.  Review of Systems: Review of Systems  Constitutional: Negative.   HENT: Negative.    Eyes: Negative.   Respiratory: Negative.    Cardiovascular: Negative.   Gastrointestinal: Negative.   Genitourinary: Negative.   Musculoskeletal: Negative.   Skin: Negative.   Neurological: Negative.   Endo/Heme/Allergies: Negative.   Psychiatric/Behavioral: Negative.     Marland Kitchen   Physical Exam:  height is 6\' 1"  (1.854 m) and weight is 219 lb (99.3 kg). His oral temperature is 98.4 F (36.9 C). His blood pressure is 137/86 and his pulse is 54 (abnormal). His respiration is 18  and oxygen saturation is 99%.   Wt Readings from Last 3 Encounters:  10/19/22 219 lb (99.3 kg)  09/27/22 218 lb (98.9 kg)  09/06/22 219 lb 1.9 oz (99.4 kg)    Physical Exam Vitals reviewed.  HENT:     Head: Normocephalic and atraumatic.  Eyes:     Pupils: Pupils are equal, round, and reactive to light.  Cardiovascular:     Rate and Rhythm: Normal rate and regular rhythm.     Heart sounds: Normal heart sounds.  Pulmonary:     Effort: Pulmonary effort is normal.     Breath sounds: Normal breath sounds.  Abdominal:     General: Bowel sounds are normal.     Palpations: Abdomen is soft.  Musculoskeletal:        General: No tenderness or deformity. Normal range of motion.     Cervical back: Normal range of motion.  Lymphadenopathy:     Cervical: No cervical adenopathy.  Skin:    General: Skin is warm and dry.     Findings: No  erythema or rash.  Neurological:     Mental Status: He is alert and oriented to person, place, and time.  Psychiatric:        Behavior: Behavior normal.        Thought Content: Thought content normal.        Judgment: Judgment normal.     Lab Results  Component Value Date   WBC 4.5 10/19/2022   HGB 14.8 10/19/2022   HCT 42.0 10/19/2022   MCV 99.5 10/19/2022   PLT 140 (L) 10/19/2022   Lab Results  Component Value Date   FERRITIN 1,263 (H) 07/20/2014   IRON 125 07/20/2014   TIBC 198 (L) 07/20/2014   UIBC 73 (L) 07/20/2014   IRONPCTSAT 63 (H) 07/20/2014   Lab Results  Component Value Date   RETICCTPCT 0.8 07/20/2014   RBC 4.22 10/19/2022   Lab Results  Component Value Date   KPAFRELGTCHN 21.6 (H) 09/27/2022   LAMBDASER 20.3 09/27/2022   KAPLAMBRATIO 1.06 09/27/2022   Lab Results  Component Value Date   IGGSERUM 1,210 09/27/2022   IGA 177 09/27/2022   IGMSERUM 32 09/27/2022   Lab Results  Component Value Date   TOTALPROTELP 6.9 09/27/2022   ALBUMINELP 4.1 09/27/2022   A1GS 0.1 09/27/2022   A2GS 0.6 09/27/2022   BETS 0.9 09/27/2022   BETA2SER 0.3 03/28/2015   GAMS 1.1 09/27/2022   MSPIKE Not Observed 09/27/2022   SPEI Comment 09/27/2022     Chemistry      Component Value Date/Time   NA 136 10/19/2022 0913   NA 140 04/22/2017 1140   NA 138 09/03/2015 1042   K 4.6 10/19/2022 0913   K 4.0 04/22/2017 1140   K 4.3 09/03/2015 1042   CL 103 10/19/2022 0913   CL 105 04/22/2017 1140   CO2 26 10/19/2022 0913   CO2 24 04/22/2017 1140   CO2 25 09/03/2015 1042   BUN 19 10/19/2022 0913   BUN 16 04/22/2017 1140   BUN 21.7 09/03/2015 1042   CREATININE 1.56 (H) 10/19/2022 0913   CREATININE 1.6 (H) 04/22/2017 1140   CREATININE 1.2 09/03/2015 1042      Component Value Date/Time   CALCIUM 9.3 10/19/2022 0913   CALCIUM 8.5 04/22/2017 1140   CALCIUM 9.1 09/03/2015 1042   ALKPHOS 56 10/19/2022 0913   ALKPHOS 59 04/22/2017 1140   ALKPHOS 42 09/03/2015 1042   AST  23 10/19/2022 0913  AST 27 09/03/2015 1042   ALT 39 10/19/2022 0913   ALT 45 04/22/2017 1140   ALT 41 09/03/2015 1042   BILITOT 0.8 10/19/2022 0913   BILITOT 0.54 09/03/2015 1042       Impression and Plan: Tyler Phillips is a very pleasant 57 yo caucasian gentleman with IgG kappa myeloma. He underwent induction chemotherapy with RVD followed by an autologous stem cell transplant at [facility] on February 2017.   Is been about a year now that he had this episode where he had marked pancytopenia.  I thought that he had myelodysplasia.  However, a bone marrow biopsy was unremarkable.  We did stop the Revlimid as we thought this might be the etiology for this pancytopenia.  Again, he is doing quite nicely.  He is responding to every 2-week Velcade.  We might consider moving him to every 3 weeks.  Again, I know that he is quite busy.  I know that he has family to travel.  Happy that they will be able to meet up in [place].  We will plan to get him back to see [place] in another 6 weeks.   Josph Macho, MD 7/2/20249:58 AM

## 2022-10-19 NOTE — Progress Notes (Signed)
Okay to treat with SCr 1.56 per Dr. Myna Hidalgo.

## 2022-10-20 ENCOUNTER — Other Ambulatory Visit: Payer: Self-pay

## 2022-10-20 LAB — KAPPA/LAMBDA LIGHT CHAINS
Kappa free light chain: 18.8 mg/L (ref 3.3–19.4)
Kappa, lambda light chain ratio: 1.1 (ref 0.26–1.65)
Lambda free light chains: 17.1 mg/L (ref 5.7–26.3)

## 2022-10-20 LAB — IGG, IGA, IGM
IgA: 182 mg/dL (ref 90–386)
IgG (Immunoglobin G), Serum: 1295 mg/dL (ref 603–1613)
IgM (Immunoglobulin M), Srm: 30 mg/dL (ref 20–172)

## 2022-10-21 LAB — PROTEIN ELECTROPHORESIS, SERUM, WITH REFLEX
A/G Ratio: 1.4 (ref 0.7–1.7)
Albumin ELP: 4.2 g/dL (ref 2.9–4.4)
Alpha-1-Globulin: 0.2 g/dL (ref 0.0–0.4)
Alpha-2-Globulin: 0.7 g/dL (ref 0.4–1.0)
Beta Globulin: 1 g/dL (ref 0.7–1.3)
Gamma Globulin: 1.2 g/dL (ref 0.4–1.8)
Globulin, Total: 3 g/dL (ref 2.2–3.9)
Total Protein ELP: 7.2 g/dL (ref 6.0–8.5)

## 2022-10-25 ENCOUNTER — Inpatient Hospital Stay: Payer: Medicare Other

## 2022-10-25 ENCOUNTER — Inpatient Hospital Stay: Payer: Medicare Other | Admitting: Family

## 2022-11-02 ENCOUNTER — Inpatient Hospital Stay: Payer: Medicare Other

## 2022-11-02 ENCOUNTER — Inpatient Hospital Stay: Payer: Medicare Other | Admitting: Medical Oncology

## 2022-11-08 ENCOUNTER — Inpatient Hospital Stay: Payer: Medicare Other

## 2022-11-16 ENCOUNTER — Inpatient Hospital Stay: Payer: Medicare Other

## 2022-11-16 VITALS — BP 136/90 | HR 59 | Temp 98.5°F | Resp 17

## 2022-11-16 DIAGNOSIS — Z5112 Encounter for antineoplastic immunotherapy: Secondary | ICD-10-CM | POA: Diagnosis not present

## 2022-11-16 DIAGNOSIS — C9001 Multiple myeloma in remission: Secondary | ICD-10-CM

## 2022-11-16 DIAGNOSIS — C9000 Multiple myeloma not having achieved remission: Secondary | ICD-10-CM | POA: Diagnosis not present

## 2022-11-16 DIAGNOSIS — Z79899 Other long term (current) drug therapy: Secondary | ICD-10-CM | POA: Diagnosis not present

## 2022-11-16 LAB — CMP (CANCER CENTER ONLY)
ALT: 36 U/L (ref 0–44)
AST: 29 U/L (ref 15–41)
Albumin: 4.4 g/dL (ref 3.5–5.0)
Alkaline Phosphatase: 51 U/L (ref 38–126)
Anion gap: 6 (ref 5–15)
BUN: 18 mg/dL (ref 6–20)
CO2: 25 mmol/L (ref 22–32)
Calcium: 8.7 mg/dL — ABNORMAL LOW (ref 8.9–10.3)
Chloride: 106 mmol/L (ref 98–111)
Creatinine: 1.49 mg/dL — ABNORMAL HIGH (ref 0.61–1.24)
GFR, Estimated: 54 mL/min — ABNORMAL LOW (ref >60)
Glucose, Bld: 97 mg/dL (ref 70–99)
Potassium: 4.5 mmol/L (ref 3.5–5.1)
Sodium: 137 mmol/L (ref 135–145)
Total Bilirubin: 0.8 mg/dL (ref 0.3–1.2)
Total Protein: 7.2 g/dL (ref 6.5–8.1)

## 2022-11-16 LAB — CBC WITH DIFFERENTIAL (CANCER CENTER ONLY)
Abs Immature Granulocytes: 0.01 10*3/uL (ref 0.00–0.07)
Basophils Absolute: 0 10*3/uL (ref 0.0–0.1)
Basophils Relative: 1 %
Eosinophils Absolute: 0.1 10*3/uL (ref 0.0–0.5)
Eosinophils Relative: 2 %
HCT: 41.3 % (ref 39.0–52.0)
Hemoglobin: 14.6 g/dL (ref 13.0–17.0)
Immature Granulocytes: 0 %
Lymphocytes Relative: 30 %
Lymphs Abs: 1.3 10*3/uL (ref 0.7–4.0)
MCH: 35.3 pg — ABNORMAL HIGH (ref 26.0–34.0)
MCHC: 35.4 g/dL (ref 30.0–36.0)
MCV: 99.8 fL (ref 80.0–100.0)
Monocytes Absolute: 0.4 10*3/uL (ref 0.1–1.0)
Monocytes Relative: 9 %
Neutro Abs: 2.5 10*3/uL (ref 1.7–7.7)
Neutrophils Relative %: 58 %
Platelet Count: 142 10*3/uL — ABNORMAL LOW (ref 150–400)
RBC: 4.14 MIL/uL — ABNORMAL LOW (ref 4.22–5.81)
RDW: 12.2 % (ref 11.5–15.5)
WBC Count: 4.3 10*3/uL (ref 4.0–10.5)
nRBC: 0 % (ref 0.0–0.2)

## 2022-11-16 MED ORDER — BORTEZOMIB CHEMO SQ INJECTION 3.5 MG (2.5MG/ML)
1.3000 mg/m2 | Freq: Once | INTRAMUSCULAR | Status: AC
  Administered 2022-11-16: 2.75 mg via SUBCUTANEOUS
  Filled 2022-11-16: qty 1.1

## 2022-11-16 MED ORDER — PROCHLORPERAZINE MALEATE 10 MG PO TABS: 10.0000 mg | ORAL_TABLET | Freq: Once | ORAL | Status: DC

## 2022-11-16 NOTE — Patient Instructions (Signed)
[facility]  Discharge Instructions: Thank you for choosing [facility] to provide your oncology and hematology care.   If you have a lab appointment with the Cancer Center, please go directly to the Cancer Center and check in at the registration area.  Wear comfortable clothing and clothing appropriate for easy access to any Portacath or PICC line.   We strive to give you quality time with your provider. You may need to reschedule your appointment if you arrive late (15 or more minutes).  Arriving late affects you and other patients whose appointments are after yours.  Also, if you miss three or more appointments without notifying the office, you may be dismissed from the clinic at the provider's discretion.      For prescription refill requests, have your pharmacy contact our office and allow 72 hours for refills to be completed.    Today you received the following chemotherapy and/or immunotherapy agents Velcade.      To help prevent nausea and vomiting after your treatment, we encourage you to take your nausea medication as directed.  BELOW ARE SYMPTOMS THAT SHOULD BE REPORTED IMMEDIATELY: *FEVER GREATER THAN 100.4 F (38 C) OR HIGHER *CHILLS OR SWEATING *NAUSEA AND VOMITING THAT IS NOT CONTROLLED WITH YOUR NAUSEA MEDICATION *UNUSUAL SHORTNESS OF BREATH *UNUSUAL BRUISING OR BLEEDING *URINARY PROBLEMS (pain or burning when urinating, or frequent urination) *BOWEL PROBLEMS (unusual diarrhea, constipation, pain near the anus) TENDERNESS IN MOUTH AND THROAT WITH OR WITHOUT PRESENCE OF ULCERS (sore throat, sores in mouth, or a toothache) UNUSUAL RASH, SWELLING OR PAIN  UNUSUAL VAGINAL DISCHARGE OR ITCHING   Items with * indicate a potential emergency and should be followed up as soon as possible or go to the Emergency Department if any problems should occur.  Please show the CHEMOTHERAPY ALERT CARD or IMMUNOTHERAPY ALERT CARD at check-in  to the Emergency Department and triage nurse. Should you have questions after your visit or need to cancel or reschedule your appointment, please contact [facility]  [phone] and follow the prompts.  Office hours are 8:00 a.m. to 4:30 p.m. Monday - Friday. Please note that voicemails left after 4:00 p.m. may not be returned until the following business day.  We are closed weekends and major holidays. You have access to a nurse at all times for urgent questions. Please call the main number to the clinic [phone] and follow the prompts.  For any non-urgent questions, you may also contact your provider using MyChart. We now offer e-Visits for anyone 18 and older to request care online for non-urgent symptoms. For details visit [email or web address].   Also download the MyChart app! Go to the app store, search "MyChart", open the app, select [facility], and log in with your MyChart username and password.   

## 2022-11-30 ENCOUNTER — Inpatient Hospital Stay: Payer: Medicare Other

## 2022-11-30 ENCOUNTER — Inpatient Hospital Stay: Payer: Medicare Other | Attending: Hematology & Oncology | Admitting: Medical Oncology

## 2022-11-30 ENCOUNTER — Other Ambulatory Visit: Payer: Self-pay

## 2022-11-30 ENCOUNTER — Encounter: Payer: Self-pay | Admitting: Medical Oncology

## 2022-11-30 ENCOUNTER — Other Ambulatory Visit: Payer: Self-pay | Admitting: Hematology & Oncology

## 2022-11-30 DIAGNOSIS — Z5112 Encounter for antineoplastic immunotherapy: Secondary | ICD-10-CM | POA: Diagnosis not present

## 2022-11-30 DIAGNOSIS — C9001 Multiple myeloma in remission: Secondary | ICD-10-CM

## 2022-11-30 DIAGNOSIS — Z9484 Stem cells transplant status: Secondary | ICD-10-CM | POA: Insufficient documentation

## 2022-11-30 DIAGNOSIS — C9000 Multiple myeloma not having achieved remission: Secondary | ICD-10-CM | POA: Diagnosis not present

## 2022-11-30 LAB — CBC WITH DIFFERENTIAL (CANCER CENTER ONLY)
Abs Immature Granulocytes: 0.01 10*3/uL (ref 0.00–0.07)
Basophils Absolute: 0 10*3/uL (ref 0.0–0.1)
Basophils Relative: 1 %
Eosinophils Absolute: 0.1 10*3/uL (ref 0.0–0.5)
Eosinophils Relative: 1 %
HCT: 39.5 % (ref 39.0–52.0)
Hemoglobin: 14 g/dL (ref 13.0–17.0)
Immature Granulocytes: 0 %
Lymphocytes Relative: 25 %
Lymphs Abs: 1 10*3/uL (ref 0.7–4.0)
MCH: 35.2 pg — ABNORMAL HIGH (ref 26.0–34.0)
MCHC: 35.4 g/dL (ref 30.0–36.0)
MCV: 99.2 fL (ref 80.0–100.0)
Monocytes Absolute: 0.3 10*3/uL (ref 0.1–1.0)
Monocytes Relative: 6 %
Neutro Abs: 2.8 10*3/uL (ref 1.7–7.7)
Neutrophils Relative %: 67 %
Platelet Count: 153 10*3/uL (ref 150–400)
RBC: 3.98 MIL/uL — ABNORMAL LOW (ref 4.22–5.81)
RDW: 11.9 % (ref 11.5–15.5)
WBC Count: 4.2 10*3/uL (ref 4.0–10.5)
nRBC: 0 % (ref 0.0–0.2)

## 2022-11-30 LAB — CMP (CANCER CENTER ONLY)
ALT: 36 U/L (ref 0–44)
AST: 23 U/L (ref 15–41)
Albumin: 4.3 g/dL (ref 3.5–5.0)
Alkaline Phosphatase: 52 U/L (ref 38–126)
Anion gap: 7 (ref 5–15)
BUN: 17 mg/dL (ref 6–20)
CO2: 26 mmol/L (ref 22–32)
Calcium: 8.9 mg/dL (ref 8.9–10.3)
Chloride: 104 mmol/L (ref 98–111)
Creatinine: 1.39 mg/dL — ABNORMAL HIGH (ref 0.61–1.24)
GFR, Estimated: 59 mL/min — ABNORMAL LOW (ref >60)
Glucose, Bld: 95 mg/dL (ref 70–99)
Potassium: 4.3 mmol/L (ref 3.5–5.1)
Sodium: 137 mmol/L (ref 135–145)
Total Bilirubin: 0.7 mg/dL (ref 0.3–1.2)
Total Protein: 7 g/dL (ref 6.5–8.1)

## 2022-11-30 LAB — LACTATE DEHYDROGENASE: LDH: 147 U/L (ref 98–192)

## 2022-11-30 MED ORDER — BORTEZOMIB CHEMO SQ INJECTION 3.5 MG (2.5MG/ML)
1.3000 mg/m2 | Freq: Once | INTRAMUSCULAR | Status: AC
  Administered 2022-11-30: 2.75 mg via SUBCUTANEOUS
  Filled 2022-11-30: qty 1.1

## 2022-11-30 MED ORDER — PROCHLORPERAZINE MALEATE 10 MG PO TABS: 10.0000 mg | ORAL_TABLET | Freq: Once | ORAL | Status: DC

## 2022-11-30 NOTE — Patient Instructions (Signed)
Bortezomib Injection What is this medication? BORTEZOMIB (bor TEZ oh mib) treats lymphoma. It may also be used to treat multiple myeloma, a type of bone marrow cancer. It works by blocking a protein that causes cancer cells to grow and multiply. This helps to slow or stop the spread of cancer cells. This medicine may be used for other purposes; ask your health care provider or pharmacist if you have questions. COMMON BRAND NAME(S): Velcade What should I tell my care team before I take this medication? They need to know if you have any of these conditions: Dehydration Diabetes Heart disease Liver disease Tingling of the fingers or toes or other nerve disorder An unusual or allergic reaction to bortezomib, other medications, foods, dyes, or preservatives If you or your partner are pregnant or trying to get pregnant Breastfeeding How should I use this medication? This medication is injected into a vein or under the skin. It is given by your care team in a hospital or clinic setting. Talk to your care team about the use of this medication in children. Special care may be needed. Overdosage: If you think you have taken too much of this medicine contact a poison control center or emergency room at once. NOTE: This medicine is only for you. Do not share this medicine with others. What if I miss a dose? Keep appointments for follow-up doses. It is important not to miss your dose. Call your care team if you are unable to keep an appointment. What may interact with this medication? Ketoconazole Rifampin This list may not describe all possible interactions. Give your health care provider a list of all the medicines, herbs, non-prescription drugs, or dietary supplements you use. Also tell them if you smoke, drink alcohol, or use illegal drugs. Some items may interact with your medicine. What should I watch for while using this medication? Your condition will be monitored carefully while you are  receiving this medication. You may need blood work while taking this medication. This medication may affect your coordination, reaction time, or judgment. Do not drive or operate machinery until you know how this medication affects you. Sit up or stand slowly to reduce the risk of dizzy or fainting spells. Drinking alcohol with this medication can increase the risk of these side effects. This medication may increase your risk of getting an infection. Call your care team for advice if you get a fever, chills, sore throat, or other symptoms of a cold or flu. Do not treat yourself. Try to avoid being around people who are sick. Check with your care team if you have severe diarrhea, nausea, and vomiting, or if you sweat a lot. The loss of too much body fluid may make it dangerous for you to take this medication. Talk to your care team if you may be pregnant. Serious birth defects can occur if you take this medication during pregnancy and for 7 months after the last dose. You will need a negative pregnancy test before starting this medication. Contraception is recommended while taking this medication and for 7 months after the last dose. Your care team can help you find the option that works for you. If your partner can get pregnant, use a condom during sex while taking this medication and for 4 months after the last dose. Do not breastfeed while taking this medication and for 2 months after the last dose. This medication may cause infertility. Talk to your care team if you are concerned about your fertility. What side effects   may I notice from receiving this medication? Side effects that you should report to your care team as soon as possible: Allergic reactions--skin rash, itching, hives, swelling of the face, lips, tongue, or throat Bleeding--bloody or black, tar-like stools, vomiting blood or brown material that looks like coffee grounds, red or dark brown urine, small red or purple spots on skin, unusual  bruising or bleeding Bleeding in the brain--severe headache, stiff neck, confusion, dizziness, change in vision, numbness or weakness of the face, arm, or leg, trouble speaking, trouble walking, vomiting Bowel blockage--stomach cramping, unable to have a bowel movement or pass gas, loss of appetite, vomiting Heart failure--shortness of breath, swelling of the ankles, feet, or hands, sudden weight gain, unusual weakness or fatigue Infection--fever, chills, cough, sore throat, wounds that don't heal, pain or trouble when passing urine, general feeling of discomfort or being unwell Liver injury--right upper belly pain, loss of appetite, nausea, light-colored stool, dark yellow or brown urine, yellowing skin or eyes, unusual weakness or fatigue Low blood pressure--dizziness, feeling faint or lightheaded, blurry vision Lung injury--shortness of breath or trouble breathing, cough, spitting up blood, chest pain, fever Pain, tingling, or numbness in the hands or feet Severe or prolonged diarrhea Stomach pain, bloody diarrhea, pale skin, unusual weakness or fatigue, decrease in the amount of urine, which may be signs of hemolytic uremic syndrome Sudden and severe headache, confusion, change in vision, seizures, which may be signs of posterior reversible encephalopathy syndrome (PRES) TTP--purple spots on the skin or inside the mouth, pale skin, yellowing skin or eyes, unusual weakness or fatigue, fever, fast or irregular heartbeat, confusion, change in vision, trouble speaking, trouble walking Tumor lysis syndrome (TLS)--nausea, vomiting, diarrhea, decrease in the amount of urine, dark urine, unusual weakness or fatigue, confusion, muscle pain or cramps, fast or irregular heartbeat, joint pain Side effects that usually do not require medical attention (report to your care team if they continue or are bothersome): Constipation Diarrhea Fatigue Loss of appetite Nausea This list may not describe all possible  side effects. Call your doctor for medical advice about side effects. You may report side effects to FDA at 1-800-FDA-1088. Where should I keep my medication? This medication is given in a hospital or clinic. It will not be stored at home. NOTE: This sheet is a summary. It may not cover all possible information. If you have questions about this medicine, talk to your doctor, pharmacist, or health care provider.  2024 Elsevier/Gold Standard (2021-09-08 00:00:00)  

## 2022-11-30 NOTE — Progress Notes (Unsigned)
Hematology and Oncology Follow Up Visit  Tyler Phillips [id number] November 27, 1965 57 y.o. 11/30/2022   Principle Diagnosis:  IgG Kappa myeloma - +4, +14, +17   Past Therapy: Status post autologous stem cell transplant on 05/22/2015   S/p cycle 6 of RVD Ninlaro 4 mg po q 2 week -- start on 01/02/2019 -- d/c on 02/10/2019   Current Therapy:        Velcade q 2wk dosing Revlimid 10mg  po q day (21/7)  - d/c on 12/07/2021   Interim History:  [name] is here today for follow-up and treatment.  So far, he was things going quite well.  As always, he and his wife are traveling.  They will be heading out to [place] I think soon.  Their family will be meeting them out there.  He and his wife are enjoying their grandchild.  She is growing up.  I think she is probably about 30 months old now.  He is doing well.  He is still very active physically.  He does a lot of exercise.  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  His myeloma is not a problem.  But we last saw him, there was no monoclonal spike in his blood.  His IgG level was 1210 mg/dL.  The Kappa light chain was 2.2 mg/dL.  He has had no cough or shortness of breath.  He has had no nausea or vomiting.  He has had no change in bowel or bladder habits.  Overall, I was his performance status is probably ECOG 0.   Medications:  Allergies as of 11/30/2022   No Known Allergies      Medication List        Accurate as of November 30, 2022  9:24 AM. If you have any questions, ask your nurse or doctor.          aspirin 325 MG tablet Take 325 mg by mouth daily.   azithromycin 500 MG tablet Commonly known as: ZITHROMAX Take 500 mg by mouth daily as needed (For pre dental appointments).   bortezomib IV 3.5 MG injection Commonly known as: VELCADE 1 mg/m2 every 14 (fourteen) days.   cetirizine 10 MG tablet Commonly known as: ZYRTEC Take 10 mg by mouth daily.   multivitamin with minerals Tabs tablet Take 1 tablet by mouth daily.    ondansetron 4 MG tablet Commonly known as: ZOFRAN Take 1 tablet (4 mg total) by mouth every 6 (six) hours as needed for nausea.   valACYclovir 500 MG tablet Commonly known as: VALTREX Take 500 mg by mouth 2 (two) times daily.   zolpidem 10 MG tablet Commonly known as: AMBIEN Take 1 tablet (10 mg total) by mouth at bedtime as needed for sleep.        Allergies: No Known Allergies  Past Medical History, Surgical history, Social history, and Family History were reviewed and updated.  Review of Systems: Review of Systems  Constitutional: Negative.   HENT: Negative.    Eyes: Negative.   Respiratory: Negative.    Cardiovascular: Negative.   Gastrointestinal: Negative.   Genitourinary: Negative.   Musculoskeletal: Negative.   Skin: Negative.   Neurological: Negative.   Endo/Heme/Allergies: Negative.   Psychiatric/Behavioral: Negative.     Marland Kitchen   Physical Exam:  height is 6\' 1"  (1.854 m) and weight is 218 lb (98.9 kg). His oral temperature is 98 F (36.7 C). His blood pressure is 136/99 (abnormal) and his pulse is 53 (abnormal). His respiration is  18 and oxygen saturation is 98%.   Wt Readings from Last 3 Encounters:  11/30/22 218 lb (98.9 kg)  10/19/22 219 lb (99.3 kg)  09/27/22 218 lb (98.9 kg)    Physical Exam Vitals reviewed.  HENT:     Head: Normocephalic and atraumatic.  Eyes:     Pupils: Pupils are equal, round, and reactive to light.  Cardiovascular:     Rate and Rhythm: Normal rate and regular rhythm.     Heart sounds: Normal heart sounds.  Pulmonary:     Effort: Pulmonary effort is normal.     Breath sounds: Normal breath sounds.  Abdominal:     General: Bowel sounds are normal.     Palpations: Abdomen is soft.  Musculoskeletal:        General: No tenderness or deformity. Normal range of motion.     Cervical back: Normal range of motion.  Lymphadenopathy:     Cervical: No cervical adenopathy.  Skin:    General: Skin is warm and dry.     Findings:  No erythema or rash.  Neurological:     Mental Status: He is alert and oriented to person, place, and time.  Psychiatric:        Behavior: Behavior normal.        Thought Content: Thought content normal.        Judgment: Judgment normal.      Lab Results  Component Value Date   WBC 4.2 11/30/2022   HGB 14.0 11/30/2022   HCT 39.5 11/30/2022   MCV 99.2 11/30/2022   PLT 153 11/30/2022   Lab Results  Component Value Date   FERRITIN 1,263 (H) 07/20/2014   IRON 125 07/20/2014   TIBC 198 (L) 07/20/2014   UIBC 73 (L) 07/20/2014   IRONPCTSAT 63 (H) 07/20/2014   Lab Results  Component Value Date   RETICCTPCT 0.8 07/20/2014   RBC 3.98 (L) 11/30/2022   Lab Results  Component Value Date   KPAFRELGTCHN 18.8 10/19/2022   LAMBDASER 17.1 10/19/2022   KAPLAMBRATIO 1.10 10/19/2022   Lab Results  Component Value Date   IGGSERUM 1,295 10/19/2022   IGA 182 10/19/2022   IGMSERUM 30 10/19/2022   Lab Results  Component Value Date   TOTALPROTELP 7.2 10/19/2022   ALBUMINELP 4.2 10/19/2022   A1GS 0.2 10/19/2022   A2GS 0.7 10/19/2022   BETS 1.0 10/19/2022   BETA2SER 0.3 03/28/2015   GAMS 1.2 10/19/2022   MSPIKE Not Observed 10/19/2022   SPEI Comment 09/27/2022     Chemistry      Component Value Date/Time   NA 137 11/16/2022 0902   NA 140 04/22/2017 1140   NA 138 09/03/2015 1042   K 4.5 11/16/2022 0902   K 4.0 04/22/2017 1140   K 4.3 09/03/2015 1042   CL 106 11/16/2022 0902   CL 105 04/22/2017 1140   CO2 25 11/16/2022 0902   CO2 24 04/22/2017 1140   CO2 25 09/03/2015 1042   BUN 18 11/16/2022 0902   BUN 16 04/22/2017 1140   BUN 21.7 09/03/2015 1042   CREATININE 1.49 (H) 11/16/2022 0902   CREATININE 1.6 (H) 04/22/2017 1140   CREATININE 1.2 09/03/2015 1042      Component Value Date/Time   CALCIUM 8.7 (L) 11/16/2022 0902   CALCIUM 8.5 04/22/2017 1140   CALCIUM 9.1 09/03/2015 1042   ALKPHOS 51 11/16/2022 0902   ALKPHOS 59 04/22/2017 1140   ALKPHOS 42 09/03/2015 1042    AST 29 11/16/2022 0902  AST 27 09/03/2015 1042   ALT 36 11/16/2022 0902   ALT 45 04/22/2017 1140   ALT 41 09/03/2015 1042   BILITOT 0.8 11/16/2022 0902   BILITOT 0.54 09/03/2015 1042       Impression and Plan: [name] is a very pleasant 57 yo caucasian gentleman with IgG kappa myeloma. He underwent induction chemotherapy with RVD followed by an autologous stem cell transplant at [facility] on February 2017.   Is been about a year now that he had this episode where he had marked pancytopenia.  I thought that he had myelodysplasia.  However, a bone marrow biopsy was unremarkable.  We did stop the Revlimid as we thought this might be the etiology for this pancytopenia.  Again, he is doing quite nicely.  He is responding to every 2-week Velcade.  We might consider moving him to every 3 weeks.  Again, I know that he is quite busy.  I know that he has family to travel.  Happy that they will be able to meet up in [place].  We will plan to get him back to see [place] in another 6 weeks.   Rushie Chestnut, PA-C 8/13/20249:24 AM

## 2022-12-01 ENCOUNTER — Encounter: Payer: Self-pay | Admitting: Hematology & Oncology

## 2022-12-03 ENCOUNTER — Inpatient Hospital Stay: Payer: Medicare Other

## 2022-12-04 ENCOUNTER — Other Ambulatory Visit: Payer: Self-pay

## 2022-12-08 LAB — UPEP/UIFE/LIGHT CHAINS/TP, 24-HR UR
% BETA, Urine: 0 %
ALPHA 1 URINE: 0 %
Albumin, U: 100 %
Alpha 2, Urine: 0 %
Free Kappa Lt Chains,Ur: 5.35 mg/L (ref 1.17–86.46)
Free Kappa/Lambda Ratio: 0.89 — ABNORMAL LOW (ref 1.83–14.26)
Free Lambda Lt Chains,Ur: 6 mg/L (ref 0.27–15.21)
GAMMA GLOBULIN URINE: 0 %
Total Protein, Urine-Ur/day: <104 mg/(24.h) (ref 30–150)
Total Protein, Urine: <4 mg/dL
Total Volume: 2600

## 2022-12-14 ENCOUNTER — Inpatient Hospital Stay: Payer: Medicare Other

## 2022-12-14 VITALS — BP 143/93 | HR 62 | Temp 98.0°F | Resp 18

## 2022-12-14 DIAGNOSIS — C9001 Multiple myeloma in remission: Secondary | ICD-10-CM

## 2022-12-14 DIAGNOSIS — C9000 Multiple myeloma not having achieved remission: Secondary | ICD-10-CM | POA: Diagnosis not present

## 2022-12-14 DIAGNOSIS — Z5112 Encounter for antineoplastic immunotherapy: Secondary | ICD-10-CM | POA: Diagnosis not present

## 2022-12-14 DIAGNOSIS — Z9484 Stem cells transplant status: Secondary | ICD-10-CM | POA: Diagnosis not present

## 2022-12-14 LAB — CBC WITH DIFFERENTIAL (CANCER CENTER ONLY)
Abs Immature Granulocytes: 0 10*3/uL (ref 0.00–0.07)
Basophils Absolute: 0 10*3/uL (ref 0.0–0.1)
Basophils Relative: 0 %
Eosinophils Absolute: 0.1 10*3/uL (ref 0.0–0.5)
Eosinophils Relative: 2 %
HCT: 39.4 % (ref 39.0–52.0)
Hemoglobin: 14 g/dL (ref 13.0–17.0)
Immature Granulocytes: 0 %
Lymphocytes Relative: 29 %
Lymphs Abs: 1.2 10*3/uL (ref 0.7–4.0)
MCH: 35.4 pg — ABNORMAL HIGH (ref 26.0–34.0)
MCHC: 35.5 g/dL (ref 30.0–36.0)
MCV: 99.7 fL (ref 80.0–100.0)
Monocytes Absolute: 0.3 10*3/uL (ref 0.1–1.0)
Monocytes Relative: 7 %
Neutro Abs: 2.6 10*3/uL (ref 1.7–7.7)
Neutrophils Relative %: 62 %
Platelet Count: 142 10*3/uL — ABNORMAL LOW (ref 150–400)
RBC: 3.95 MIL/uL — ABNORMAL LOW (ref 4.22–5.81)
RDW: 11.9 % (ref 11.5–15.5)
WBC Count: 4.2 10*3/uL (ref 4.0–10.5)
nRBC: 0 % (ref 0.0–0.2)

## 2022-12-14 LAB — CMP (CANCER CENTER ONLY)
ALT: 33 U/L (ref 0–44)
AST: 25 U/L (ref 15–41)
Albumin: 4.4 g/dL (ref 3.5–5.0)
Alkaline Phosphatase: 53 U/L (ref 38–126)
Anion gap: 6 (ref 5–15)
BUN: 16 mg/dL (ref 6–20)
CO2: 24 mmol/L (ref 22–32)
Calcium: 8.7 mg/dL — ABNORMAL LOW (ref 8.9–10.3)
Chloride: 106 mmol/L (ref 98–111)
Creatinine: 1.34 mg/dL — ABNORMAL HIGH (ref 0.61–1.24)
GFR, Estimated: >60 mL/min (ref >60)
Glucose, Bld: 90 mg/dL (ref 70–99)
Potassium: 4.4 mmol/L (ref 3.5–5.1)
Sodium: 136 mmol/L (ref 135–145)
Total Bilirubin: 0.5 mg/dL (ref 0.3–1.2)
Total Protein: 6.7 g/dL (ref 6.5–8.1)

## 2022-12-14 MED ORDER — PROCHLORPERAZINE MALEATE 10 MG PO TABS: 10.0000 mg | ORAL_TABLET | Freq: Once | ORAL | Status: DC

## 2022-12-14 MED ORDER — BORTEZOMIB CHEMO SQ INJECTION 3.5 MG (2.5MG/ML)
1.3000 mg/m2 | Freq: Once | INTRAMUSCULAR | Status: AC
  Administered 2022-12-14: 2.75 mg via SUBCUTANEOUS
  Filled 2022-12-14: qty 1.1

## 2022-12-14 NOTE — Patient Instructions (Signed)
[facility]  Discharge Instructions: Thank you for choosing [facility] to provide your oncology and hematology care.   If you have a lab appointment with the Cancer Center, please go directly to the Cancer Center and check in at the registration area.  Wear comfortable clothing and clothing appropriate for easy access to any Portacath or PICC line.   We strive to give you quality time with your provider. You may need to reschedule your appointment if you arrive late (15 or more minutes).  Arriving late affects you and other patients whose appointments are after yours.  Also, if you miss three or more appointments without notifying the office, you may be dismissed from the clinic at the provider's discretion.      For prescription refill requests, have your pharmacy contact our office and allow 72 hours for refills to be completed.    Today you received the following chemotherapy and/or immunotherapy agents Velcade.      To help prevent nausea and vomiting after your treatment, we encourage you to take your nausea medication as directed.  BELOW ARE SYMPTOMS THAT SHOULD BE REPORTED IMMEDIATELY: *FEVER GREATER THAN 100.4 F (38 C) OR HIGHER *CHILLS OR SWEATING *NAUSEA AND VOMITING THAT IS NOT CONTROLLED WITH YOUR NAUSEA MEDICATION *UNUSUAL SHORTNESS OF BREATH *UNUSUAL BRUISING OR BLEEDING *URINARY PROBLEMS (pain or burning when urinating, or frequent urination) *BOWEL PROBLEMS (unusual diarrhea, constipation, pain near the anus) TENDERNESS IN MOUTH AND THROAT WITH OR WITHOUT PRESENCE OF ULCERS (sore throat, sores in mouth, or a toothache) UNUSUAL RASH, SWELLING OR PAIN  UNUSUAL VAGINAL DISCHARGE OR ITCHING   Items with * indicate a potential emergency and should be followed up as soon as possible or go to the Emergency Department if any problems should occur.  Please show the CHEMOTHERAPY ALERT CARD or IMMUNOTHERAPY ALERT CARD at check-in  to the Emergency Department and triage nurse. Should you have questions after your visit or need to cancel or reschedule your appointment, please contact [facility]  [phone] and follow the prompts.  Office hours are 8:00 a.m. to 4:30 p.m. Monday - Friday. Please note that voicemails left after 4:00 p.m. may not be returned until the following business day.  We are closed weekends and major holidays. You have access to a nurse at all times for urgent questions. Please call the main number to the clinic [phone] and follow the prompts.  For any non-urgent questions, you may also contact your provider using MyChart. We now offer e-Visits for anyone 18 and older to request care online for non-urgent symptoms. For details visit [email or web address].   Also download the MyChart app! Go to the app store, search "MyChart", open the app, select [facility], and log in with your MyChart username and password.   

## 2022-12-28 ENCOUNTER — Other Ambulatory Visit: Payer: Medicare Other

## 2022-12-28 ENCOUNTER — Ambulatory Visit: Payer: Medicare Other

## 2022-12-30 ENCOUNTER — Inpatient Hospital Stay: Payer: Medicare Other | Attending: Hematology & Oncology

## 2022-12-30 ENCOUNTER — Encounter: Payer: Self-pay | Admitting: Hematology & Oncology

## 2022-12-30 ENCOUNTER — Inpatient Hospital Stay: Payer: Medicare Other

## 2022-12-30 VITALS — BP 139/89 | HR 65 | Temp 98.3°F | Resp 17

## 2022-12-30 DIAGNOSIS — C9001 Multiple myeloma in remission: Secondary | ICD-10-CM

## 2022-12-30 DIAGNOSIS — Z5112 Encounter for antineoplastic immunotherapy: Secondary | ICD-10-CM | POA: Insufficient documentation

## 2022-12-30 DIAGNOSIS — C9000 Multiple myeloma not having achieved remission: Secondary | ICD-10-CM | POA: Diagnosis not present

## 2022-12-30 LAB — CBC WITH DIFFERENTIAL (CANCER CENTER ONLY)
Abs Immature Granulocytes: 0.01 10*3/uL (ref 0.00–0.07)
Basophils Absolute: 0 10*3/uL (ref 0.0–0.1)
Basophils Relative: 0 %
Eosinophils Absolute: 0.1 10*3/uL (ref 0.0–0.5)
Eosinophils Relative: 1 %
HCT: 41.8 % (ref 39.0–52.0)
Hemoglobin: 14.8 g/dL (ref 13.0–17.0)
Immature Granulocytes: 0 %
Lymphocytes Relative: 29 %
Lymphs Abs: 1.4 10*3/uL (ref 0.7–4.0)
MCH: 35.1 pg — ABNORMAL HIGH (ref 26.0–34.0)
MCHC: 35.4 g/dL (ref 30.0–36.0)
MCV: 99.1 fL (ref 80.0–100.0)
Monocytes Absolute: 0.3 10*3/uL (ref 0.1–1.0)
Monocytes Relative: 7 %
Neutro Abs: 3.2 10*3/uL (ref 1.7–7.7)
Neutrophils Relative %: 63 %
Platelet Count: 137 10*3/uL — ABNORMAL LOW (ref 150–400)
RBC: 4.22 MIL/uL (ref 4.22–5.81)
RDW: 11.9 % (ref 11.5–15.5)
WBC Count: 5 10*3/uL (ref 4.0–10.5)
nRBC: 0 % (ref 0.0–0.2)

## 2022-12-30 LAB — CMP (CANCER CENTER ONLY)
ALT: 38 U/L (ref 0–44)
AST: 22 U/L (ref 15–41)
Albumin: 4.4 g/dL (ref 3.5–5.0)
Alkaline Phosphatase: 56 U/L (ref 38–126)
Anion gap: 7 (ref 5–15)
BUN: 17 mg/dL (ref 6–20)
CO2: 29 mmol/L (ref 22–32)
Calcium: 8.9 mg/dL (ref 8.9–10.3)
Chloride: 102 mmol/L (ref 98–111)
Creatinine: 1.48 mg/dL — ABNORMAL HIGH (ref 0.61–1.24)
GFR, Estimated: 55 mL/min — ABNORMAL LOW (ref >60)
Glucose, Bld: 123 mg/dL — ABNORMAL HIGH (ref 70–99)
Potassium: 3.7 mmol/L (ref 3.5–5.1)
Sodium: 138 mmol/L (ref 135–145)
Total Bilirubin: 0.7 mg/dL (ref 0.3–1.2)
Total Protein: 6.8 g/dL (ref 6.5–8.1)

## 2022-12-30 MED ORDER — BORTEZOMIB CHEMO SQ INJECTION 3.5 MG (2.5MG/ML)
1.3000 mg/m2 | Freq: Once | INTRAMUSCULAR | Status: AC
  Administered 2022-12-30: 2.75 mg via SUBCUTANEOUS
  Filled 2022-12-30: qty 1.1

## 2022-12-30 MED ORDER — PROCHLORPERAZINE MALEATE 10 MG PO TABS: 10.0000 mg | ORAL_TABLET | Freq: Once | ORAL | Status: DC

## 2022-12-30 NOTE — Patient Instructions (Signed)
[facility]  Discharge Instructions: Thank you for choosing [facility] to provide your oncology and hematology care.   If you have a lab appointment with the Cancer Center, please go directly to the Cancer Center and check in at the registration area.  Wear comfortable clothing and clothing appropriate for easy access to any Portacath or PICC line.   We strive to give you quality time with your provider. You may need to reschedule your appointment if you arrive late (15 or more minutes).  Arriving late affects you and other patients whose appointments are after yours.  Also, if you miss three or more appointments without notifying the office, you may be dismissed from the clinic at the provider's discretion.      For prescription refill requests, have your pharmacy contact our office and allow 72 hours for refills to be completed.    Today you received the following chemotherapy and/or immunotherapy agents Velcade.      To help prevent nausea and vomiting after your treatment, we encourage you to take your nausea medication as directed.  BELOW ARE SYMPTOMS THAT SHOULD BE REPORTED IMMEDIATELY: *FEVER GREATER THAN 100.4 F (38 C) OR HIGHER *CHILLS OR SWEATING *NAUSEA AND VOMITING THAT IS NOT CONTROLLED WITH YOUR NAUSEA MEDICATION *UNUSUAL SHORTNESS OF BREATH *UNUSUAL BRUISING OR BLEEDING *URINARY PROBLEMS (pain or burning when urinating, or frequent urination) *BOWEL PROBLEMS (unusual diarrhea, constipation, pain near the anus) TENDERNESS IN MOUTH AND THROAT WITH OR WITHOUT PRESENCE OF ULCERS (sore throat, sores in mouth, or a toothache) UNUSUAL RASH, SWELLING OR PAIN  UNUSUAL VAGINAL DISCHARGE OR ITCHING   Items with * indicate a potential emergency and should be followed up as soon as possible or go to the Emergency Department if any problems should occur.  Please show the CHEMOTHERAPY ALERT CARD or IMMUNOTHERAPY ALERT CARD at check-in  to the Emergency Department and triage nurse. Should you have questions after your visit or need to cancel or reschedule your appointment, please contact [facility]  [phone] and follow the prompts.  Office hours are 8:00 a.m. to 4:30 p.m. Monday - Friday. Please note that voicemails left after 4:00 p.m. may not be returned until the following business day.  We are closed weekends and major holidays. You have access to a nurse at all times for urgent questions. Please call the main number to the clinic [phone] and follow the prompts.  For any non-urgent questions, you may also contact your provider using MyChart. We now offer e-Visits for anyone 18 and older to request care online for non-urgent symptoms. For details visit [email or web address].   Also download the MyChart app! Go to the app store, search "MyChart", open the app, select [facility], and log in with your MyChart username and password.   

## 2023-01-01 ENCOUNTER — Other Ambulatory Visit: Payer: Self-pay

## 2023-01-05 ENCOUNTER — Encounter: Payer: Self-pay | Admitting: Hematology & Oncology

## 2023-01-06 ENCOUNTER — Other Ambulatory Visit: Payer: Self-pay

## 2023-01-11 ENCOUNTER — Other Ambulatory Visit: Payer: Medicare Other

## 2023-01-11 ENCOUNTER — Ambulatory Visit: Payer: Medicare Other | Admitting: Medical Oncology

## 2023-01-11 ENCOUNTER — Ambulatory Visit: Payer: Medicare Other

## 2023-01-13 ENCOUNTER — Inpatient Hospital Stay: Payer: Medicare Other

## 2023-01-13 ENCOUNTER — Ambulatory Visit: Payer: Medicare Other

## 2023-01-13 ENCOUNTER — Other Ambulatory Visit: Payer: Self-pay

## 2023-01-13 ENCOUNTER — Ambulatory Visit: Payer: Medicare Other | Admitting: Medical Oncology

## 2023-01-14 ENCOUNTER — Inpatient Hospital Stay (HOSPITAL_BASED_OUTPATIENT_CLINIC_OR_DEPARTMENT_OTHER): Payer: Medicare Other | Admitting: Oncology

## 2023-01-14 ENCOUNTER — Inpatient Hospital Stay: Payer: Medicare Other

## 2023-01-14 ENCOUNTER — Encounter: Payer: Self-pay | Admitting: Oncology

## 2023-01-14 VITALS — BP 131/105 | HR 57 | Temp 98.1°F | Resp 18 | Wt 219.0 lb

## 2023-01-14 DIAGNOSIS — C9001 Multiple myeloma in remission: Secondary | ICD-10-CM

## 2023-01-14 DIAGNOSIS — C9000 Multiple myeloma not having achieved remission: Secondary | ICD-10-CM

## 2023-01-14 DIAGNOSIS — Z5112 Encounter for antineoplastic immunotherapy: Secondary | ICD-10-CM | POA: Diagnosis not present

## 2023-01-14 LAB — CMP (CANCER CENTER ONLY)
ALT: 40 U/L (ref 0–44)
AST: 24 U/L (ref 15–41)
Albumin: 4.4 g/dL (ref 3.5–5.0)
Alkaline Phosphatase: 53 U/L (ref 38–126)
Anion gap: 6 (ref 5–15)
BUN: 13 mg/dL (ref 6–20)
CO2: 28 mmol/L (ref 22–32)
Calcium: 8.9 mg/dL (ref 8.9–10.3)
Chloride: 104 mmol/L (ref 98–111)
Creatinine: 1.46 mg/dL — ABNORMAL HIGH (ref 0.61–1.24)
GFR, Estimated: 55 mL/min — ABNORMAL LOW (ref >60)
Glucose, Bld: 96 mg/dL (ref 70–99)
Potassium: 4.7 mmol/L (ref 3.5–5.1)
Sodium: 138 mmol/L (ref 135–145)
Total Bilirubin: 0.6 mg/dL (ref 0.3–1.2)
Total Protein: 7.5 g/dL (ref 6.5–8.1)

## 2023-01-14 LAB — CBC WITH DIFFERENTIAL (CANCER CENTER ONLY)
Abs Immature Granulocytes: 0.01 10*3/uL (ref 0.00–0.07)
Basophils Absolute: 0 10*3/uL (ref 0.0–0.1)
Basophils Relative: 0 %
Eosinophils Absolute: 0.1 10*3/uL (ref 0.0–0.5)
Eosinophils Relative: 2 %
HCT: 42.1 % (ref 39.0–52.0)
Hemoglobin: 15.2 g/dL (ref 13.0–17.0)
Immature Granulocytes: 0 %
Lymphocytes Relative: 27 %
Lymphs Abs: 1.3 10*3/uL (ref 0.7–4.0)
MCH: 35.8 pg — ABNORMAL HIGH (ref 26.0–34.0)
MCHC: 36.1 g/dL — ABNORMAL HIGH (ref 30.0–36.0)
MCV: 99.3 fL (ref 80.0–100.0)
Monocytes Absolute: 0.3 10*3/uL (ref 0.1–1.0)
Monocytes Relative: 6 %
Neutro Abs: 3 10*3/uL (ref 1.7–7.7)
Neutrophils Relative %: 65 %
Platelet Count: 156 10*3/uL (ref 150–400)
RBC: 4.24 MIL/uL (ref 4.22–5.81)
RDW: 12 % (ref 11.5–15.5)
WBC Count: 4.7 10*3/uL (ref 4.0–10.5)
nRBC: 0 % (ref 0.0–0.2)

## 2023-01-14 MED ORDER — BORTEZOMIB CHEMO SQ INJECTION 3.5 MG (2.5MG/ML)
1.3000 mg/m2 | Freq: Once | INTRAMUSCULAR | Status: AC
  Administered 2023-01-14: 2.75 mg via SUBCUTANEOUS
  Filled 2023-01-14: qty 1.1

## 2023-01-14 MED ORDER — PROCHLORPERAZINE MALEATE 10 MG PO TABS: 10.0000 mg | ORAL_TABLET | Freq: Once | ORAL | Status: DC

## 2023-01-14 NOTE — Patient Instructions (Signed)
[facility]  Discharge Instructions: Thank you for choosing [facility] to provide your oncology and hematology care.   If you have a lab appointment with the St[facility] , please go directly to the [facility] and check in at the registration area.  Wear comfortable clothing and clothing appropriate for easy access to any Portacath or PICC line.   We strive to give you quality time with your provider. You may need to reschedule your appointment if you arrive late (15 or more minutes).  Arriving late affects you and other patients whose appointments are after yours.  Also, if you miss three or more appointments without notifying the office, you may be dismissed from the clinic at the provider's discretion.      For prescription refill requests, have your pharmacy contact our office and allow 72 hours for refills to be completed.    Today you received the following chemotherapy and/or immunotherapy agents velcade     To help prevent nausea and vomiting after your treatment, we encourage you to take your nausea medication as directed.  BELOW ARE SYMPTOMS THAT SHOULD BE REPORTED IMMEDIATELY: *FEVER GREATER THAN 100.4 F (38 C) OR HIGHER *CHILLS OR SWEATING *NAUSEA AND VOMITING THAT IS NOT CONTROLLED WITH YOUR NAUSEA MEDICATION *UNUSUAL SHORTNESS OF BREATH *UNUSUAL BRUISING OR BLEEDING *URINARY PROBLEMS (pain or burning when urinating, or frequent urination) *BOWEL PROBLEMS (unusual diarrhea, constipation, pain near the anus) TENDERNESS IN MOUTH AND THROAT WITH OR WITHOUT PRESENCE OF ULCERS (sore throat, sores in mouth, or a toothache) UNUSUAL RASH, SWELLING OR PAIN  UNUSUAL VAGINAL DISCHARGE OR ITCHING   Items with * indicate a potential emergency and should be followed up as soon as possible or go to the Emergency Department if any problems should occur.  Please show the CHEMOTHERAPY ALERT CARD or IMMUNOTHERAPY ALERT CARD at check-in  to the Emergency Department and triage nurse. Should you have questions after your visit or need to cancel or reschedule your appointment, please contact [facility]  [phone] and follow the prompts.  Office hours are 8:00 a.m. to 4:30 p.m. Monday - Friday. Please note that voicemails left after 4:00 p.m. may not be returned until the following business day.  We are closed weekends and major holidays. You have access to a nurse at all times for urgent questions. Please call the main number to the clinic [phone] and follow the prompts.  For any non-urgent questions, you may also contact your provider using MyChart. We now offer e-Visits for anyone 33 and older to request care online for non-urgent symptoms. For details visit [email or web address].   Also download the MyChart app! Go to the app store, search "MyChart", open the app, select , and log in with your MyChart username and password.

## 2023-01-14 NOTE — Progress Notes (Signed)
BP elevated. Pt.denied any symptoms. Aware to call if needed.

## 2023-01-14 NOTE — Progress Notes (Signed)
Hematology and Oncology Follow Up Visit  Saarth Angelos [id number] 07-May-1965 57 y.o. 01/14/2023   Principle Diagnosis:  IgG Kappa myeloma - +4, +14, +17   Past Therapy: Status post autologous stem cell transplant on 05/22/2015   S/p cycle 6 of RVD [place] 4 mg po q 2 week -- start on 01/02/2019 -- d/c on 02/10/2019   Current Therapy:        Velcade q 2wk dosing Revlimid 10mg  po q day (21/7)  - d/c on 12/07/2021   Interim History:  Mr. Kohout is here today for follow-up and treatment.  He continues to do well and has no specific complaints.  He is staying very active.  Weight remains stable.  He is not having any fevers, chills, chest pain, shortness of breath, cough.  No abdominal pain, nausea, vomiting.  No bleeding.  His myeloma is well-controlled.  Last IgG level was 1133 and kappa, lambda light chain ratio was normal at 0.96.  Overall, I was his performance status is probably ECOG 0.   Wt Readings from Last 3 Encounters:  01/14/23 99.3 kg  11/30/22 98.9 kg  10/19/22 99.3 kg     Medications:  Allergies as of 01/14/2023   No Known Allergies      Medication List        Accurate as of January 14, 2023  9:30 AM. If you have any questions, ask your nurse or doctor.          aspirin 325 MG tablet Take 325 mg by mouth daily.   azithromycin 500 MG tablet Commonly known as: ZITHROMAX Take 500 mg by mouth daily as needed (For pre dental appointments).   bortezomib IV 3.5 MG injection Commonly known as: VELCADE 1 mg/m2 every 14 (fourteen) days.   cetirizine 10 MG tablet Commonly known as: ZYRTEC Take 10 mg by mouth daily.   multivitamin with minerals Tabs tablet Take 1 tablet by mouth daily.   ondansetron 4 MG tablet Commonly known as: ZOFRAN Take 1 tablet (4 mg total) by mouth every 6 (six) hours as needed for nausea.   valACYclovir 500 MG tablet Commonly known as: VALTREX Take 500 mg by mouth 2 (two) times daily.   zolpidem 10 MG tablet Commonly  known as: AMBIEN Take 1 tablet (10 mg total) by mouth at bedtime as needed for sleep.        Allergies: No Known Allergies  Past Medical History, Surgical history, Social history, and Family History were reviewed and updated.  Review of Systems: Review of Systems  Constitutional: Negative.   HENT: Negative.    Eyes: Negative.   Respiratory: Negative.    Cardiovascular: Negative.   Gastrointestinal: Negative.   Genitourinary: Negative.   Musculoskeletal: Negative.   Skin: Negative.   Neurological: Negative.   Endo/Heme/Allergies: Negative.   Psychiatric/Behavioral: Negative.     Marland Kitchen   Physical Exam:  weight is 99.3 kg. His oral temperature is 98.1 F (36.7 C). His blood pressure is 131/105 (abnormal) and his pulse is 57 (abnormal). His respiration is 18 and oxygen saturation is 100%.   Wt Readings from Last 3 Encounters:  01/14/23 99.3 kg  11/30/22 98.9 kg  10/19/22 99.3 kg    Physical Exam Vitals reviewed.  HENT:     Head: Normocephalic and atraumatic.  Eyes:     Pupils: Pupils are equal, round, and reactive to light.  Cardiovascular:     Rate and Rhythm: Normal rate and regular rhythm.     Heart sounds: Normal  heart sounds.  Pulmonary:     Effort: Pulmonary effort is normal.     Breath sounds: Normal breath sounds.  Abdominal:     General: Bowel sounds are normal.     Palpations: Abdomen is soft.  Musculoskeletal:        General: No tenderness or deformity. Normal range of motion.     Cervical back: Normal range of motion.  Lymphadenopathy:     Cervical: No cervical adenopathy.  Skin:    General: Skin is warm and dry.     Findings: No erythema or rash.  Neurological:     Mental Status: He is alert and oriented to person, place, and time.  Psychiatric:        Behavior: Behavior normal.        Thought Content: Thought content normal.        Judgment: Judgment normal.      Lab Results  Component Value Date   WBC 4.7 01/14/2023   HGB 15.2  01/14/2023   HCT 42.1 01/14/2023   MCV 99.3 01/14/2023   PLT 156 01/14/2023   Lab Results  Component Value Date   FERRITIN 1,263 (H) 07/20/2014   IRON 125 07/20/2014   TIBC 198 (L) 07/20/2014   UIBC 73 (L) 07/20/2014   IRONPCTSAT 63 (H) 07/20/2014   Lab Results  Component Value Date   RETICCTPCT 0.8 07/20/2014   RBC 4.24 01/14/2023   Lab Results  Component Value Date   KPAFRELGTCHN 18.3 11/30/2022   LAMBDASER 19.0 11/30/2022   KAPLAMBRATIO 0.89 (L) 11/30/2022   Lab Results  Component Value Date   IGGSERUM 1,133 11/30/2022   IGA 161 11/30/2022   IGMSERUM 25 11/30/2022   Lab Results  Component Value Date   TOTALPROTELP 6.6 11/30/2022   ALBUMINELP 4.1 11/30/2022   A1GS 0.1 11/30/2022   A2GS 0.6 11/30/2022   BETS 0.8 11/30/2022   BETA2SER 0.3 03/28/2015   GAMS 1.0 11/30/2022   MSPIKE Not Observed 11/30/2022   SPEI Comment 09/27/2022     Chemistry      Component Value Date/Time   NA 138 01/14/2023 0851   NA 140 04/22/2017 1140   NA 138 09/03/2015 1042   K 4.7 01/14/2023 0851   K 4.0 04/22/2017 1140   K 4.3 09/03/2015 1042   CL 104 01/14/2023 0851   CL 105 04/22/2017 1140   CO2 28 01/14/2023 0851   CO2 24 04/22/2017 1140   CO2 25 09/03/2015 1042   BUN 13 01/14/2023 0851   BUN 16 04/22/2017 1140   BUN 21.7 09/03/2015 1042   CREATININE 1.46 (H) 01/14/2023 0851   CREATININE 1.6 (H) 04/22/2017 1140   CREATININE 1.2 09/03/2015 1042      Component Value Date/Time   CALCIUM 8.9 01/14/2023 0851   CALCIUM 8.5 04/22/2017 1140   CALCIUM 9.1 09/03/2015 1042   ALKPHOS 53 01/14/2023 0851   ALKPHOS 59 04/22/2017 1140   ALKPHOS 42 09/03/2015 1042   AST 24 01/14/2023 0851   AST 27 09/03/2015 1042   ALT 40 01/14/2023 0851   ALT 45 04/22/2017 1140   ALT 41 09/03/2015 1042   BILITOT 0.6 01/14/2023 0851   BILITOT 0.54 09/03/2015 1042       Impression and Plan: Mr. Gravier is a very pleasant 57 yo caucasian gentleman with IgG kappa myeloma. He underwent induction  chemotherapy with RVD followed by an autologous stem cell transplant at [facility] on February 2017.   Had a history of pancytopenia on Revlimid.  Bone  marrow biopsy was unremarkable.  He continues to do well.  Labs from today reviewed and are adequate to proceed with treatment.  Light chains from today are currently pending.  Is to continue on Velcade every 2 weeks.  We will schedule him back for lab and chemo every 2 weeks with a lab, office visit, chemotherapy in 6 weeks.  Blood pressure noted to be elevated today in our clinic.  Will continue to monitor this.  Clenton Pare, NP 9/27/20249:30 AM

## 2023-01-17 LAB — KAPPA/LAMBDA LIGHT CHAINS
Kappa free light chain: 19.4 mg/L (ref 3.3–19.4)
Kappa, lambda light chain ratio: 1.08 (ref 0.26–1.65)
Lambda free light chains: 18 mg/L (ref 5.7–26.3)

## 2023-01-24 DIAGNOSIS — Z23 Encounter for immunization: Secondary | ICD-10-CM | POA: Diagnosis not present

## 2023-01-24 DIAGNOSIS — Z0000 Encounter for general adult medical examination without abnormal findings: Secondary | ICD-10-CM | POA: Diagnosis not present

## 2023-01-24 DIAGNOSIS — Z13220 Encounter for screening for lipoid disorders: Secondary | ICD-10-CM | POA: Diagnosis not present

## 2023-01-25 DIAGNOSIS — N1831 Chronic kidney disease, stage 3a: Secondary | ICD-10-CM | POA: Diagnosis not present

## 2023-01-25 DIAGNOSIS — D519 Vitamin B12 deficiency anemia, unspecified: Secondary | ICD-10-CM | POA: Diagnosis not present

## 2023-01-25 DIAGNOSIS — Z0000 Encounter for general adult medical examination without abnormal findings: Secondary | ICD-10-CM | POA: Diagnosis not present

## 2023-01-25 DIAGNOSIS — Z136 Encounter for screening for cardiovascular disorders: Secondary | ICD-10-CM | POA: Diagnosis not present

## 2023-01-25 DIAGNOSIS — Z13220 Encounter for screening for lipoid disorders: Secondary | ICD-10-CM | POA: Diagnosis not present

## 2023-01-30 ENCOUNTER — Emergency Department (HOSPITAL_BASED_OUTPATIENT_CLINIC_OR_DEPARTMENT_OTHER): Payer: Medicare Other

## 2023-01-30 ENCOUNTER — Emergency Department (HOSPITAL_BASED_OUTPATIENT_CLINIC_OR_DEPARTMENT_OTHER)
Admission: EM | Admit: 2023-01-30 | Discharge: 2023-01-30 | Disposition: A | Discharge status: Home / Self Care | Payer: Medicare Other | Attending: Emergency Medicine | Admitting: Emergency Medicine

## 2023-01-30 ENCOUNTER — Encounter (HOSPITAL_BASED_OUTPATIENT_CLINIC_OR_DEPARTMENT_OTHER): Payer: Self-pay | Admitting: Emergency Medicine

## 2023-01-30 ENCOUNTER — Other Ambulatory Visit: Payer: Self-pay

## 2023-01-30 DIAGNOSIS — Z7982 Long term (current) use of aspirin: Secondary | ICD-10-CM | POA: Diagnosis not present

## 2023-01-30 DIAGNOSIS — Y92410 Unspecified street and highway as the place of occurrence of the external cause: Secondary | ICD-10-CM | POA: Diagnosis not present

## 2023-01-30 DIAGNOSIS — S99922A Unspecified injury of left foot, initial encounter: Secondary | ICD-10-CM | POA: Diagnosis present

## 2023-01-30 DIAGNOSIS — S92325A Nondisplaced fracture of second metatarsal bone, left foot, initial encounter for closed fracture: Secondary | ICD-10-CM | POA: Diagnosis not present

## 2023-01-30 DIAGNOSIS — S92335A Nondisplaced fracture of third metatarsal bone, left foot, initial encounter for closed fracture: Secondary | ICD-10-CM | POA: Insufficient documentation
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
## 2023-01-31 DIAGNOSIS — S92302A Fracture of unspecified metatarsal bone(s), left foot, initial encounter for closed fracture: Secondary | ICD-10-CM | POA: Diagnosis not present

## 2023-02-01 ENCOUNTER — Other Ambulatory Visit: Payer: Self-pay

## 2023-02-01 ENCOUNTER — Ambulatory Visit
Admission: RE | Admit: 2023-02-01 | Discharge: 2023-02-01 | Disposition: A | Discharge status: Home / Self Care | Payer: Medicare Other | Source: Ambulatory Visit | Attending: Orthopaedic Surgery | Admitting: Orthopaedic Surgery

## 2023-02-01 ENCOUNTER — Other Ambulatory Visit: Payer: Self-pay | Admitting: Orthopaedic Surgery

## 2023-02-01 DIAGNOSIS — S92332A Displaced fracture of third metatarsal bone, left foot, initial encounter for closed fracture: Secondary | ICD-10-CM | POA: Diagnosis not present

## 2023-02-01 DIAGNOSIS — S92312A Displaced fracture of first metatarsal bone, left foot, initial encounter for closed fracture: Secondary | ICD-10-CM | POA: Diagnosis not present

## 2023-02-01 DIAGNOSIS — S92222A Displaced fracture of lateral cuneiform of left foot, initial encounter for closed fracture: Secondary | ICD-10-CM | POA: Diagnosis not present

## 2023-02-01 DIAGNOSIS — M79672 Pain in left foot: Secondary | ICD-10-CM

## 2023-02-01 DIAGNOSIS — S92342A Displaced fracture of fourth metatarsal bone, left foot, initial encounter for closed fracture: Secondary | ICD-10-CM | POA: Diagnosis not present

## 2023-02-02 ENCOUNTER — Telehealth: Payer: Self-pay

## 2023-02-02 DIAGNOSIS — S92302A Fracture of unspecified metatarsal bone(s), left foot, initial encounter for closed fracture: Secondary | ICD-10-CM | POA: Diagnosis not present
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
## 2023-02-03 ENCOUNTER — Inpatient Hospital Stay: Payer: Medicare Other | Attending: Hematology & Oncology

## 2023-02-03 ENCOUNTER — Inpatient Hospital Stay: Payer: Medicare Other

## 2023-02-03 DIAGNOSIS — C9001 Multiple myeloma in remission: Secondary | ICD-10-CM

## 2023-02-10 ENCOUNTER — Other Ambulatory Visit: Payer: Self-pay

## 2023-02-17 ENCOUNTER — Inpatient Hospital Stay: Payer: Medicare Other | Attending: Hematology & Oncology

## 2023-02-17 ENCOUNTER — Telehealth: Payer: Self-pay

## 2023-02-17 ENCOUNTER — Inpatient Hospital Stay: Payer: Medicare Other

## 2023-02-17 VITALS — BP 141/96 | HR 73 | Temp 97.4°F | Resp 17

## 2023-02-17 DIAGNOSIS — C9001 Multiple myeloma in remission: Secondary | ICD-10-CM

## 2023-02-17 DIAGNOSIS — Z5112 Encounter for antineoplastic immunotherapy: Secondary | ICD-10-CM | POA: Insufficient documentation

## 2023-02-17 DIAGNOSIS — C9000 Multiple myeloma not having achieved remission: Secondary | ICD-10-CM | POA: Diagnosis not present

## 2023-02-17 LAB — CBC WITH DIFFERENTIAL (CANCER CENTER ONLY)
Abs Immature Granulocytes: 0.02 10*3/uL (ref 0.00–0.07)
Basophils Absolute: 0 10*3/uL (ref 0.0–0.1)
Basophils Relative: 0 %
Eosinophils Absolute: 0.1 10*3/uL (ref 0.0–0.5)
Eosinophils Relative: 1 %
HCT: 39.9 % (ref 39.0–52.0)
Hemoglobin: 14.3 g/dL (ref 13.0–17.0)
Immature Granulocytes: 0 %
Lymphocytes Relative: 19 %
Lymphs Abs: 1.6 10*3/uL (ref 0.7–4.0)
MCH: 34.5 pg — ABNORMAL HIGH (ref 26.0–34.0)
MCHC: 35.8 g/dL (ref 30.0–36.0)
MCV: 96.4 fL (ref 80.0–100.0)
Monocytes Absolute: 0.6 10*3/uL (ref 0.1–1.0)
Monocytes Relative: 8 %
Neutro Abs: 6 10*3/uL (ref 1.7–7.7)
Neutrophils Relative %: 72 %
Platelet Count: 160 10*3/uL (ref 150–400)
RBC: 4.14 MIL/uL — ABNORMAL LOW (ref 4.22–5.81)
RDW: 11.9 % (ref 11.5–15.5)
WBC Count: 8.4 10*3/uL (ref 4.0–10.5)
nRBC: 0 % (ref 0.0–0.2)

## 2023-02-17 LAB — CMP (CANCER CENTER ONLY)
ALT: 26 U/L (ref 0–44)
AST: 18 U/L (ref 15–41)
Albumin: 4.7 g/dL (ref 3.5–5.0)
Alkaline Phosphatase: 67 U/L (ref 38–126)
Anion gap: 8 (ref 5–15)
BUN: 17 mg/dL (ref 6–20)
CO2: 24 mmol/L (ref 22–32)
Calcium: 9.5 mg/dL (ref 8.9–10.3)
Chloride: 104 mmol/L (ref 98–111)
Creatinine: 1.41 mg/dL — ABNORMAL HIGH (ref 0.61–1.24)
GFR, Estimated: 58 mL/min — ABNORMAL LOW (ref >60)
Glucose, Bld: 106 mg/dL — ABNORMAL HIGH (ref 70–99)
Potassium: 4.1 mmol/L (ref 3.5–5.1)
Sodium: 136 mmol/L (ref 135–145)
Total Bilirubin: 1 mg/dL (ref 0.3–1.2)
Total Protein: 7.3 g/dL (ref 6.5–8.1)

## 2023-02-17 MED ORDER — BORTEZOMIB CHEMO SQ INJECTION 3.5 MG (2.5MG/ML)
1.3000 mg/m2 | Freq: Once | INTRAMUSCULAR | Status: AC
  Administered 2023-02-17: 2.75 mg via SUBCUTANEOUS
  Filled 2023-02-17: qty 1.1

## 2023-02-17 MED ORDER — PROCHLORPERAZINE MALEATE 10 MG PO TABS: 10.0000 mg | ORAL_TABLET | Freq: Once | ORAL | Status: DC

## 2023-02-17 NOTE — Telephone Encounter (Signed)
Called toi check on the patient since he didn't show for his 0845 appointment. Patient stated that he is on his way.

## 2023-02-17 NOTE — Patient Instructions (Signed)
Bortezomib Injection What is this medication? BORTEZOMIB (bor TEZ oh mib) treats lymphoma. It may also be used to treat multiple myeloma, a type of bone marrow cancer. It works by blocking a protein that causes cancer cells to grow and multiply. This helps to slow or stop the spread of cancer cells. This medicine may be used for other purposes; ask your health care provider or pharmacist if you have questions. COMMON BRAND NAME(S): Velcade What should I tell my care team before I take this medication? They need to know if you have any of these conditions: Dehydration Diabetes Heart disease Liver disease Tingling of the fingers or toes or other nerve disorder An unusual or allergic reaction to bortezomib, other medications, foods, dyes, or preservatives If you or your partner are pregnant or trying to get pregnant Breastfeeding How should I use this medication? This medication is injected into a vein or under the skin. It is given by your care team in a hospital or clinic setting. Talk to your care team about the use of this medication in children. Special care may be needed. Overdosage: If you think you have taken too much of this medicine contact a poison control center or emergency room at once. NOTE: This medicine is only for you. Do not share this medicine with others. What if I miss a dose? Keep appointments for follow-up doses. It is important not to miss your dose. Call your care team if you are unable to keep an appointment. What may interact with this medication? Ketoconazole Rifampin This list may not describe all possible interactions. Give your health care provider a list of all the medicines, herbs, non-prescription drugs, or dietary supplements you use. Also tell them if you smoke, drink alcohol, or use illegal drugs. Some items may interact with your medicine. What should I watch for while using this medication? Your condition will be monitored carefully while you are  receiving this medication. You may need blood work while taking this medication. This medication may affect your coordination, reaction time, or judgment. Do not drive or operate machinery until you know how this medication affects you. Sit up or stand slowly to reduce the risk of dizzy or fainting spells. Drinking alcohol with this medication can increase the risk of these side effects. This medication may increase your risk of getting an infection. Call your care team for advice if you get a fever, chills, sore throat, or other symptoms of a cold or flu. Do not treat yourself. Try to avoid being around people who are sick. Check with your care team if you have severe diarrhea, nausea, and vomiting, or if you sweat a lot. The loss of too much body fluid may make it dangerous for you to take this medication. Talk to your care team if you may be pregnant. Serious birth defects can occur if you take this medication during pregnancy and for 7 months after the last dose. You will need a negative pregnancy test before starting this medication. Contraception is recommended while taking this medication and for 7 months after the last dose. Your care team can help you find the option that works for you. If your partner can get pregnant, use a condom during sex while taking this medication and for 4 months after the last dose. Do not breastfeed while taking this medication and for 2 months after the last dose. This medication may cause infertility. Talk to your care team if you are concerned about your fertility. What side effects   may I notice from receiving this medication? Side effects that you should report to your care team as soon as possible: Allergic reactions--skin rash, itching, hives, swelling of the face, lips, tongue, or throat Bleeding--bloody or black, tar-like stools, vomiting blood or brown material that looks like coffee grounds, red or dark brown urine, small red or purple spots on skin, unusual  bruising or bleeding Bleeding in the brain--severe headache, stiff neck, confusion, dizziness, change in vision, numbness or weakness of the face, arm, or leg, trouble speaking, trouble walking, vomiting Bowel blockage--stomach cramping, unable to have a bowel movement or pass gas, loss of appetite, vomiting Heart failure--shortness of breath, swelling of the ankles, feet, or hands, sudden weight gain, unusual weakness or fatigue Infection--fever, chills, cough, sore throat, wounds that don't heal, pain or trouble when passing urine, general feeling of discomfort or being unwell Liver injury--right upper belly pain, loss of appetite, nausea, light-colored stool, dark yellow or brown urine, yellowing skin or eyes, unusual weakness or fatigue Low blood pressure--dizziness, feeling faint or lightheaded, blurry vision Lung injury--shortness of breath or trouble breathing, cough, spitting up blood, chest pain, fever Pain, tingling, or numbness in the hands or feet Severe or prolonged diarrhea Stomach pain, bloody diarrhea, pale skin, unusual weakness or fatigue, decrease in the amount of urine, which may be signs of hemolytic uremic syndrome Sudden and severe headache, confusion, change in vision, seizures, which may be signs of posterior reversible encephalopathy syndrome (PRES) TTP--purple spots on the skin or inside the mouth, pale skin, yellowing skin or eyes, unusual weakness or fatigue, fever, fast or irregular heartbeat, confusion, change in vision, trouble speaking, trouble walking Tumor lysis syndrome (TLS)--nausea, vomiting, diarrhea, decrease in the amount of urine, dark urine, unusual weakness or fatigue, confusion, muscle pain or cramps, fast or irregular heartbeat, joint pain Side effects that usually do not require medical attention (report to your care team if they continue or are bothersome): Constipation Diarrhea Fatigue Loss of appetite Nausea This list may not describe all possible  side effects. Call your doctor for medical advice about side effects. You may report side effects to FDA at 1-800-FDA-1088. Where should I keep my medication? This medication is given in a hospital or clinic. It will not be stored at home. NOTE: This sheet is a summary. It may not cover all possible information. If you have questions about this medicine, talk to your doctor, pharmacist, or health care provider.  2024 Elsevier/Gold Standard (2021-09-08 00:00:00)  

## 2023-02-28 DIAGNOSIS — S92302A Fracture of unspecified metatarsal bone(s), left foot, initial encounter for closed fracture: Secondary | ICD-10-CM | POA: Diagnosis not present

## 2023-03-10 ENCOUNTER — Inpatient Hospital Stay: Payer: Medicare Other

## 2023-03-10 ENCOUNTER — Inpatient Hospital Stay: Payer: Medicare Other | Attending: Hematology & Oncology | Admitting: Hematology & Oncology

## 2023-03-10 ENCOUNTER — Encounter: Payer: Self-pay | Admitting: Hematology & Oncology

## 2023-03-10 VITALS — BP 136/92 | HR 61 | Temp 98.0°F | Resp 20 | Ht 73.0 in | Wt 219.1 lb

## 2023-03-10 VITALS — BP 146/93

## 2023-03-10 DIAGNOSIS — C9000 Multiple myeloma not having achieved remission: Secondary | ICD-10-CM | POA: Insufficient documentation

## 2023-03-10 DIAGNOSIS — C9001 Multiple myeloma in remission: Secondary | ICD-10-CM

## 2023-03-10 DIAGNOSIS — Z5112 Encounter for antineoplastic immunotherapy: Secondary | ICD-10-CM | POA: Diagnosis not present

## 2023-03-10 LAB — CMP (CANCER CENTER ONLY)
ALT: 38 U/L (ref 0–44)
AST: 22 U/L (ref 15–41)
Albumin: 4.4 g/dL (ref 3.5–5.0)
Alkaline Phosphatase: 56 U/L (ref 38–126)
Anion gap: 6 (ref 5–15)
BUN: 18 mg/dL (ref 6–20)
CO2: 25 mmol/L (ref 22–32)
Calcium: 9.4 mg/dL (ref 8.9–10.3)
Chloride: 105 mmol/L (ref 98–111)
Creatinine: 1.34 mg/dL — ABNORMAL HIGH (ref 0.61–1.24)
GFR, Estimated: >60 mL/min (ref >60)
Glucose, Bld: 109 mg/dL — ABNORMAL HIGH (ref 70–99)
Potassium: 4.2 mmol/L (ref 3.5–5.1)
Sodium: 136 mmol/L (ref 135–145)
Total Bilirubin: 0.5 mg/dL (ref <1.2)
Total Protein: 7.1 g/dL (ref 6.5–8.1)

## 2023-03-10 LAB — CBC WITH DIFFERENTIAL (CANCER CENTER ONLY)
Abs Immature Granulocytes: 0.01 10*3/uL (ref 0.00–0.07)
Basophils Absolute: 0 10*3/uL (ref 0.0–0.1)
Basophils Relative: 0 %
Eosinophils Absolute: 0.1 10*3/uL (ref 0.0–0.5)
Eosinophils Relative: 2 %
HCT: 39.7 % (ref 39.0–52.0)
Hemoglobin: 14.4 g/dL (ref 13.0–17.0)
Immature Granulocytes: 0 %
Lymphocytes Relative: 28 %
Lymphs Abs: 1.3 10*3/uL (ref 0.7–4.0)
MCH: 35.3 pg — ABNORMAL HIGH (ref 26.0–34.0)
MCHC: 36.3 g/dL — ABNORMAL HIGH (ref 30.0–36.0)
MCV: 97.3 fL (ref 80.0–100.0)
Monocytes Absolute: 0.3 10*3/uL (ref 0.1–1.0)
Monocytes Relative: 7 %
Neutro Abs: 2.9 10*3/uL (ref 1.7–7.7)
Neutrophils Relative %: 63 %
Platelet Count: 157 10*3/uL (ref 150–400)
RBC: 4.08 MIL/uL — ABNORMAL LOW (ref 4.22–5.81)
RDW: 11.7 % (ref 11.5–15.5)
WBC Count: 4.6 10*3/uL (ref 4.0–10.5)
nRBC: 0 % (ref 0.0–0.2)

## 2023-03-10 MED ORDER — BORTEZOMIB CHEMO SQ INJECTION 3.5 MG (2.5MG/ML)
1.3000 mg/m2 | Freq: Once | INTRAMUSCULAR | Status: AC
  Administered 2023-03-10: 2.75 mg via SUBCUTANEOUS
  Filled 2023-03-10: qty 1.1

## 2023-03-10 MED ORDER — PROCHLORPERAZINE MALEATE 10 MG PO TABS: 10.0000 mg | ORAL_TABLET | Freq: Once | ORAL | Status: DC

## 2023-03-10 NOTE — Progress Notes (Signed)
OK to treat with BP-146/93 per order of Dr. Myna Hidalgo.

## 2023-03-10 NOTE — Patient Instructions (Signed)
 [facility]  Discharge Instructions: Thank you for choosing [facility] to provide your oncology and hematology care.   If you have a lab appointment with the Cancer Center, please go directly to the Cancer Center and check in at the registration area.  Wear comfortable clothing and clothing appropriate for easy access to any Portacath or PICC line.   We strive to give you quality time with your provider. You may need to reschedule your appointment if you arrive late (15 or more minutes).  Arriving late affects you and other patients whose appointments are after yours.  Also, if you miss three or more appointments without notifying the office, you may be dismissed from the clinic at the provider's discretion.      For prescription refill requests, have your pharmacy contact our office and allow 72 hours for refills to be completed.    Today you received the following chemotherapy and/or immunotherapy agents Velcade      To help prevent nausea and vomiting after your treatment, we encourage you to take your nausea medication as directed.  BELOW ARE SYMPTOMS THAT SHOULD BE REPORTED IMMEDIATELY: *FEVER GREATER THAN 100.4 F (38 C) OR HIGHER *CHILLS OR SWEATING *NAUSEA AND VOMITING THAT IS NOT CONTROLLED WITH YOUR NAUSEA MEDICATION *UNUSUAL SHORTNESS OF BREATH *UNUSUAL BRUISING OR BLEEDING *URINARY PROBLEMS (pain or burning when urinating, or frequent urination) *BOWEL PROBLEMS (unusual diarrhea, constipation, pain near the anus) TENDERNESS IN MOUTH AND THROAT WITH OR WITHOUT PRESENCE OF ULCERS (sore throat, sores in mouth, or a toothache) UNUSUAL RASH, SWELLING OR PAIN  UNUSUAL VAGINAL DISCHARGE OR ITCHING   Items with * indicate a potential emergency and should be followed up as soon as possible or go to the Emergency Department if any problems should occur.  Please show the CHEMOTHERAPY ALERT CARD or IMMUNOTHERAPY  ALERT CARD at check-in to the Emergency Department and triage nurse. Should you have questions after your visit or need to cancel or reschedule your appointment, please contact [facility] - [facility] OF [facility]  [phone] and follow the prompts.  Office hours are 8:00 a.m. to 4:30 p.m. Monday - Friday. Please note that voicemails left after 4:00 p.m. may not be returned until the following business day.  We are closed weekends and major holidays. You have access to a nurse at all times for urgent questions. Please call the main number to the clinic [phone] and follow the prompts.  For any non-urgent questions, you may also contact your provider using MyChart. We now offer e-Visits for anyone 92 and older to request care online for non-urgent symptoms. For details visit [email or web address].   Also download the MyChart app! Go to the app store, search "MyChart", open the app, select [facility], and log in with your MyChart username and password.
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
## 2023-03-10 NOTE — Progress Notes (Signed)
BP remains elevated, 136/94, instructed to monitor at home and if it remains elevated, notify PCP. Verbalized understanding.

## 2023-03-11 ENCOUNTER — Other Ambulatory Visit: Payer: Self-pay

## 2023-03-11 LAB — KAPPA/LAMBDA LIGHT CHAINS
Kappa free light chain: 18.6 mg/L (ref 3.3–19.4)
Kappa, lambda light chain ratio: 1.01 (ref 0.26–1.65)
Lambda free light chains: 18.5 mg/L (ref 5.7–26.3)

## 2023-03-12 LAB — IGG, IGA, IGM
IgA: 152 mg/dL (ref 90–386)
IgG (Immunoglobin G), Serum: 1150 mg/dL (ref 603–1613)
IgM (Immunoglobulin M), Srm: 34 mg/dL (ref 20–172)

## 2023-03-16 LAB — PROTEIN ELECTROPHORESIS, SERUM, WITH REFLEX
A/G Ratio: 1.4 (ref 0.7–1.7)
Albumin ELP: 4 g/dL (ref 2.9–4.4)
Alpha-1-Globulin: 0.2 g/dL (ref 0.0–0.4)
Alpha-2-Globulin: 0.6 g/dL (ref 0.4–1.0)
Beta Globulin: 1 g/dL (ref 0.7–1.3)
Gamma Globulin: 1.1 g/dL (ref 0.4–1.8)
Globulin, Total: 2.9 g/dL (ref 2.2–3.9)
Total Protein ELP: 6.9 g/dL (ref 6.0–8.5)

## 2023-03-21 DIAGNOSIS — S92302A Fracture of unspecified metatarsal bone(s), left foot, initial encounter for closed fracture: Secondary | ICD-10-CM | POA: Diagnosis not present

## 2023-03-24 ENCOUNTER — Inpatient Hospital Stay: Payer: Medicare Other

## 2023-03-24 ENCOUNTER — Inpatient Hospital Stay: Payer: Medicare Other | Attending: Hematology & Oncology

## 2023-03-24 VITALS — BP 160/85 | HR 58 | Temp 97.3°F | Resp 17

## 2023-03-24 DIAGNOSIS — C9001 Multiple myeloma in remission: Secondary | ICD-10-CM

## 2023-03-24 DIAGNOSIS — Z5112 Encounter for antineoplastic immunotherapy: Secondary | ICD-10-CM | POA: Insufficient documentation

## 2023-03-24 DIAGNOSIS — C9000 Multiple myeloma not having achieved remission: Secondary | ICD-10-CM | POA: Diagnosis not present

## 2023-03-24 LAB — CBC WITH DIFFERENTIAL (CANCER CENTER ONLY)
Abs Immature Granulocytes: 0.01 10*3/uL (ref 0.00–0.07)
Basophils Absolute: 0 10*3/uL (ref 0.0–0.1)
Basophils Relative: 0 %
Eosinophils Absolute: 0.1 10*3/uL (ref 0.0–0.5)
Eosinophils Relative: 2 %
HCT: 40.4 % (ref 39.0–52.0)
Hemoglobin: 14.6 g/dL (ref 13.0–17.0)
Immature Granulocytes: 0 %
Lymphocytes Relative: 27 %
Lymphs Abs: 1.6 10*3/uL (ref 0.7–4.0)
MCH: 35.3 pg — ABNORMAL HIGH (ref 26.0–34.0)
MCHC: 36.1 g/dL — ABNORMAL HIGH (ref 30.0–36.0)
MCV: 97.6 fL (ref 80.0–100.0)
Monocytes Absolute: 0.5 10*3/uL (ref 0.1–1.0)
Monocytes Relative: 8 %
Neutro Abs: 3.9 10*3/uL (ref 1.7–7.7)
Neutrophils Relative %: 63 %
Platelet Count: 151 10*3/uL (ref 150–400)
RBC: 4.14 MIL/uL — ABNORMAL LOW (ref 4.22–5.81)
RDW: 12 % (ref 11.5–15.5)
WBC Count: 6.1 10*3/uL (ref 4.0–10.5)
nRBC: 0 % (ref 0.0–0.2)

## 2023-03-24 LAB — CMP (CANCER CENTER ONLY)
ALT: 35 U/L (ref 0–44)
AST: 24 U/L (ref 15–41)
Albumin: 4.5 g/dL (ref 3.5–5.0)
Alkaline Phosphatase: 58 U/L (ref 38–126)
Anion gap: 7 (ref 5–15)
BUN: 19 mg/dL (ref 6–20)
CO2: 28 mmol/L (ref 22–32)
Calcium: 9.2 mg/dL (ref 8.9–10.3)
Chloride: 102 mmol/L (ref 98–111)
Creatinine: 1.38 mg/dL — ABNORMAL HIGH (ref 0.61–1.24)
GFR, Estimated: 59 mL/min — ABNORMAL LOW (ref >60)
Glucose, Bld: 96 mg/dL (ref 70–99)
Potassium: 4.2 mmol/L (ref 3.5–5.1)
Sodium: 137 mmol/L (ref 135–145)
Total Bilirubin: 0.6 mg/dL (ref <1.2)
Total Protein: 7.5 g/dL (ref 6.5–8.1)

## 2023-03-24 MED ORDER — BORTEZOMIB CHEMO SQ INJECTION 3.5 MG (2.5MG/ML)
1.3000 mg/m2 | Freq: Once | INTRAMUSCULAR | Status: AC
  Administered 2023-03-24: 2.75 mg via SUBCUTANEOUS
  Filled 2023-03-24: qty 1.1

## 2023-03-24 MED ORDER — PROCHLORPERAZINE MALEATE 10 MG PO TABS: 10.0000 mg | ORAL_TABLET | Freq: Once | ORAL | Status: DC

## 2023-03-24 NOTE — Patient Instructions (Signed)
[facility] - A DEPT OF [facility]  Discharge Instructions: Thank you for choosing [facility] to provide your oncology and hematology care.   If you have a lab appointment with the Cancer Center, please go directly to the Cancer Center and check in at the registration area.  Wear comfortable clothing and clothing appropriate for easy access to any Portacath or PICC line.   We strive to give you quality time with your provider. You may need to reschedule your appointment if you arrive late (15 or more minutes).  Arriving late affects you and other patients whose appointments are after yours.  Also, if you miss three or more appointments without notifying the office, you may be dismissed from the clinic at the provider's discretion.      For prescription refill requests, have your pharmacy contact our office and allow 72 hours for refills to be completed.    Today you received the following chemotherapy and/or immunotherapy agents Velcade      To help prevent nausea and vomiting after your treatment, we encourage you to take your nausea medication as directed.  BELOW ARE SYMPTOMS THAT SHOULD BE REPORTED IMMEDIATELY: *FEVER GREATER THAN 100.4 F (38 C) OR HIGHER *CHILLS OR SWEATING *NAUSEA AND VOMITING THAT IS NOT CONTROLLED WITH YOUR NAUSEA MEDICATION *UNUSUAL SHORTNESS OF BREATH *UNUSUAL BRUISING OR BLEEDING *URINARY PROBLEMS (pain or burning when urinating, or frequent urination) *BOWEL PROBLEMS (unusual diarrhea, constipation, pain near the anus) TENDERNESS IN MOUTH AND THROAT WITH OR WITHOUT PRESENCE OF ULCERS (sore throat, sores in mouth, or a toothache) UNUSUAL RASH, SWELLING OR PAIN  UNUSUAL VAGINAL DISCHARGE OR ITCHING   Items with * indicate a potential emergency and should be followed up as soon as possible or go to the Emergency Department if any problems should occur.  Please show the CHEMOTHERAPY ALERT CARD or IMMUNOTHERAPY  ALERT CARD at check-in to the Emergency Department and triage nurse. Should you have questions after your visit or need to cancel or reschedule your appointment, please contact [facility] - A DEPT OF [facility]  [phone] and follow the prompts.  Office hours are 8:00 a.m. to 4:30 p.m. Monday - Friday. Please note that voicemails left after 4:00 p.m. may not be returned until the following business day.  We are closed weekends and major holidays. You have access to a nurse at all times for urgent questions. Please call the main number to the clinic [phone] and follow the prompts.  For any non-urgent questions, you may also contact your provider using MyChart. We now offer e-Visits for anyone 62 and older to request care online for non-urgent symptoms. For details visit [email or web address].   Also download the MyChart app! Go to the app store, search "MyChart", open the app, select [place], and log in with your MyChart username and password.

## 2023-04-07 ENCOUNTER — Other Ambulatory Visit: Payer: Self-pay | Admitting: Hematology & Oncology

## 2023-04-07 ENCOUNTER — Inpatient Hospital Stay: Payer: Medicare Other

## 2023-04-07 VITALS — BP 144/92 | HR 58 | Temp 97.9°F | Resp 20

## 2023-04-07 DIAGNOSIS — C9001 Multiple myeloma in remission: Secondary | ICD-10-CM

## 2023-04-07 DIAGNOSIS — Z5112 Encounter for antineoplastic immunotherapy: Secondary | ICD-10-CM | POA: Diagnosis not present

## 2023-04-07 DIAGNOSIS — C9000 Multiple myeloma not having achieved remission: Secondary | ICD-10-CM | POA: Diagnosis not present

## 2023-04-07 LAB — CBC WITH DIFFERENTIAL (CANCER CENTER ONLY)
Abs Immature Granulocytes: 0.01 10*3/uL (ref 0.00–0.07)
Basophils Absolute: 0 10*3/uL (ref 0.0–0.1)
Basophils Relative: 1 %
Eosinophils Absolute: 0.2 10*3/uL (ref 0.0–0.5)
Eosinophils Relative: 4 %
HCT: 39.1 % (ref 39.0–52.0)
Hemoglobin: 14.2 g/dL (ref 13.0–17.0)
Immature Granulocytes: 0 %
Lymphocytes Relative: 21 %
Lymphs Abs: 1 10*3/uL (ref 0.7–4.0)
MCH: 35.1 pg — ABNORMAL HIGH (ref 26.0–34.0)
MCHC: 36.3 g/dL — ABNORMAL HIGH (ref 30.0–36.0)
MCV: 96.5 fL (ref 80.0–100.0)
Monocytes Absolute: 0.5 10*3/uL (ref 0.1–1.0)
Monocytes Relative: 10 %
Neutro Abs: 3 10*3/uL (ref 1.7–7.7)
Neutrophils Relative %: 64 %
Platelet Count: 147 10*3/uL — ABNORMAL LOW (ref 150–400)
RBC: 4.05 MIL/uL — ABNORMAL LOW (ref 4.22–5.81)
RDW: 12 % (ref 11.5–15.5)
WBC Count: 4.7 10*3/uL (ref 4.0–10.5)
nRBC: 0 % (ref 0.0–0.2)

## 2023-04-07 LAB — CMP (CANCER CENTER ONLY)
ALT: 35 U/L (ref 0–44)
AST: 21 U/L (ref 15–41)
Albumin: 4.3 g/dL (ref 3.5–5.0)
Alkaline Phosphatase: 60 U/L (ref 38–126)
Anion gap: 7 (ref 5–15)
BUN: 19 mg/dL (ref 6–20)
CO2: 25 mmol/L (ref 22–32)
Calcium: 9 mg/dL (ref 8.9–10.3)
Chloride: 105 mmol/L (ref 98–111)
Creatinine: 1.3 mg/dL — ABNORMAL HIGH (ref 0.61–1.24)
GFR, Estimated: >60 mL/min (ref >60)
Glucose, Bld: 104 mg/dL — ABNORMAL HIGH (ref 70–99)
Potassium: 4.2 mmol/L (ref 3.5–5.1)
Sodium: 137 mmol/L (ref 135–145)
Total Bilirubin: 0.7 mg/dL (ref <1.2)
Total Protein: 6.9 g/dL (ref 6.5–8.1)

## 2023-04-07 MED ORDER — BORTEZOMIB CHEMO SQ INJECTION 3.5 MG (2.5MG/ML)
1.3000 mg/m2 | Freq: Once | INTRAMUSCULAR | Status: AC
Start: 2023-04-07 — End: 2023-04-07
  Administered 2023-04-07: 2.75 mg via SUBCUTANEOUS
  Filled 2023-04-07: qty 1.1

## 2023-04-07 MED ORDER — PROCHLORPERAZINE MALEATE 10 MG PO TABS: 10.0000 mg | ORAL_TABLET | Freq: Once | ORAL | Status: DC

## 2023-04-07 NOTE — Patient Instructions (Signed)
 [facility] OF [facility]  Discharge Instructions: Thank you for choosing [facility] to provide your oncology and hematology care.   If you have a lab appointment with the Cancer Center, please go directly to the Cancer Center and check in at the registration area.  Wear comfortable clothing and clothing appropriate for easy access to any Portacath or PICC line.   We strive to give you quality time with your provider. You may need to reschedule your appointment if you arrive late (15 or more minutes).  Arriving late affects you and other patients whose appointments are after yours.  Also, if you miss three or more appointments without notifying the office, you may be dismissed from the clinic at the provider's discretion.      For prescription refill requests, have your pharmacy contact our office and allow 72 hours for refills to be completed.    Today you received the following chemotherapy and/or immunotherapy agents Velcade      To help prevent nausea and vomiting after your treatment, we encourage you to take your nausea medication as directed.  BELOW ARE SYMPTOMS THAT SHOULD BE REPORTED IMMEDIATELY: *FEVER GREATER THAN 100.4 F (38 C) OR HIGHER *CHILLS OR SWEATING *NAUSEA AND VOMITING THAT IS NOT CONTROLLED WITH YOUR NAUSEA MEDICATION *UNUSUAL SHORTNESS OF BREATH *UNUSUAL BRUISING OR BLEEDING *URINARY PROBLEMS (pain or burning when urinating, or frequent urination) *BOWEL PROBLEMS (unusual diarrhea, constipation, pain near the anus) TENDERNESS IN MOUTH AND THROAT WITH OR WITHOUT PRESENCE OF ULCERS (sore throat, sores in mouth, or a toothache) UNUSUAL RASH, SWELLING OR PAIN  UNUSUAL VAGINAL DISCHARGE OR ITCHING   Items with * indicate a potential emergency and should be followed up as soon as possible or go to the Emergency Department if any problems should occur.  Please show the CHEMOTHERAPY ALERT CARD or IMMUNOTHERAPY  ALERT CARD at check-in to the Emergency Department and triage nurse. Should you have questions after your visit or need to cancel or reschedule your appointment, please contact [facility] [facility] - A DEPT OF [facility]  [phone] and follow the prompts.  Office hours are 8:00 a.m. to 4:30 p.m. Monday - Friday. Please note that voicemails left after 4:00 p.m. may not be returned until the following business day.  We are closed weekends and major holidays. You have access to a nurse at all times for urgent questions. Please call the main number to the clinic [phone] and follow the prompts.  For any non-urgent questions, you may also contact your provider using MyChart. We now offer e-Visits for anyone 62 and older to request care online for non-urgent symptoms. For details visit [email or web address].   Also download the MyChart app! Go to the app store, search "MyChart", open the app, select [place], and log in with your MyChart username and password.

## 2023-04-19 ENCOUNTER — Other Ambulatory Visit: Payer: Self-pay

## 2023-04-19 ENCOUNTER — Emergency Department (HOSPITAL_COMMUNITY): Payer: Medicare Other

## 2023-04-19 ENCOUNTER — Encounter (HOSPITAL_COMMUNITY): Payer: Self-pay

## 2023-04-19 ENCOUNTER — Emergency Department (HOSPITAL_COMMUNITY)
Admission: EM | Admit: 2023-04-19 | Discharge: 2023-04-19 | Disposition: A | Discharge status: Home / Self Care | Payer: Medicare Other | Attending: Emergency Medicine | Admitting: Emergency Medicine

## 2023-04-19 DIAGNOSIS — R000 Tachycardia, unspecified: Secondary | ICD-10-CM | POA: Diagnosis not present

## 2023-04-19 DIAGNOSIS — J069 Acute upper respiratory infection, unspecified: Secondary | ICD-10-CM

## 2023-04-19 DIAGNOSIS — E860 Dehydration: Secondary | ICD-10-CM | POA: Insufficient documentation

## 2023-04-19 DIAGNOSIS — C9000 Multiple myeloma not having achieved remission: Secondary | ICD-10-CM | POA: Insufficient documentation

## 2023-04-19 DIAGNOSIS — D72829 Elevated white blood cell count, unspecified: Secondary | ICD-10-CM | POA: Insufficient documentation

## 2023-04-19 DIAGNOSIS — A419 Sepsis, unspecified organism: Secondary | ICD-10-CM | POA: Diagnosis not present

## 2023-04-19 DIAGNOSIS — Z7982 Long term (current) use of aspirin: Secondary | ICD-10-CM | POA: Insufficient documentation

## 2023-04-19 DIAGNOSIS — R509 Fever, unspecified: Secondary | ICD-10-CM | POA: Diagnosis not present

## 2023-04-19 DIAGNOSIS — Z20822 Contact with and (suspected) exposure to covid-19: Secondary | ICD-10-CM | POA: Insufficient documentation

## 2023-04-19 DIAGNOSIS — Z79899 Other long term (current) drug therapy: Secondary | ICD-10-CM | POA: Insufficient documentation

## 2023-04-19 LAB — CBC WITH DIFFERENTIAL/PLATELET
Abs Immature Granulocytes: 0.06 10*3/uL (ref 0.00–0.07)
Basophils Absolute: 0 10*3/uL (ref 0.0–0.1)
Basophils Relative: 0 %
Eosinophils Absolute: 0 10*3/uL (ref 0.0–0.5)
Eosinophils Relative: 0 %
HCT: 38.7 % — ABNORMAL LOW (ref 39.0–52.0)
Hemoglobin: 13.7 g/dL (ref 13.0–17.0)
Immature Granulocytes: 1 %
Lymphocytes Relative: 5 %
Lymphs Abs: 0.7 10*3/uL (ref 0.7–4.0)
MCH: 34.5 pg — ABNORMAL HIGH (ref 26.0–34.0)
MCHC: 35.4 g/dL (ref 30.0–36.0)
MCV: 97.5 fL (ref 80.0–100.0)
Monocytes Absolute: 0.8 10*3/uL (ref 0.1–1.0)
Monocytes Relative: 6 %
Neutro Abs: 11.6 10*3/uL — ABNORMAL HIGH (ref 1.7–7.7)
Neutrophils Relative %: 88 %
Platelets: 160 10*3/uL (ref 150–400)
RBC: 3.97 MIL/uL — ABNORMAL LOW (ref 4.22–5.81)
RDW: 11.9 % (ref 11.5–15.5)
WBC: 13.2 10*3/uL — ABNORMAL HIGH (ref 4.0–10.5)
nRBC: 0 % (ref 0.0–0.2)

## 2023-04-19 LAB — COMPREHENSIVE METABOLIC PANEL
ALT: 43 U/L (ref 0–44)
AST: 24 U/L (ref 15–41)
Albumin: 3.9 g/dL (ref 3.5–5.0)
Alkaline Phosphatase: 61 U/L (ref 38–126)
Anion gap: 9 (ref 5–15)
BUN: 18 mg/dL (ref 6–20)
CO2: 19 mmol/L — ABNORMAL LOW (ref 22–32)
Calcium: 8.4 mg/dL — ABNORMAL LOW (ref 8.9–10.3)
Chloride: 101 mmol/L (ref 98–111)
Creatinine, Ser: 1.56 mg/dL — ABNORMAL HIGH (ref 0.61–1.24)
GFR, Estimated: 51 mL/min — ABNORMAL LOW (ref >60)
Glucose, Bld: 117 mg/dL — ABNORMAL HIGH (ref 70–99)
Potassium: 4.1 mmol/L (ref 3.5–5.1)
Sodium: 129 mmol/L — ABNORMAL LOW (ref 135–145)
Total Bilirubin: 1.1 mg/dL (ref 0.0–1.2)
Total Protein: 7.2 g/dL (ref 6.5–8.1)

## 2023-04-19 LAB — URINALYSIS, W/ REFLEX TO CULTURE (INFECTION SUSPECTED)
Bacteria, UA: NONE SEEN
Bilirubin Urine: NEGATIVE
Glucose, UA: NEGATIVE mg/dL
Hgb urine dipstick: NEGATIVE
Ketones, ur: NEGATIVE mg/dL
Leukocytes,Ua: NEGATIVE
Nitrite: NEGATIVE
Protein, ur: NEGATIVE mg/dL
Specific Gravity, Urine: 1.014 (ref 1.005–1.030)
pH: 7 (ref 5.0–8.0)

## 2023-04-19 LAB — APTT: aPTT: 27 s (ref 24–36)

## 2023-04-19 LAB — RESP PANEL BY RT-PCR (RSV, FLU A&B, COVID)  RVPGX2
Influenza A by PCR: NEGATIVE
Influenza B by PCR: NEGATIVE
Resp Syncytial Virus by PCR: NEGATIVE
SARS Coronavirus 2 by RT PCR: NEGATIVE

## 2023-04-19 LAB — I-STAT CG4 LACTIC ACID, ED
Lactic Acid, Venous: 0.7 mmol/L (ref 0.5–1.9)
Lactic Acid, Venous: 0.7 mmol/L (ref 0.5–1.9)

## 2023-04-19 LAB — PROTIME-INR
INR: 1 (ref 0.8–1.2)
Prothrombin Time: 13.5 s (ref 11.4–15.2)

## 2023-04-19 MED ORDER — SODIUM CHLORIDE 0.9 % IV BOLUS
1000.0000 mL | Freq: Once | INTRAVENOUS | Status: AC
Start: 2023-04-19 — End: 2023-04-19
  Administered 2023-04-19: 1000 mL via INTRAVENOUS

## 2023-04-19 MED ORDER — ACETAMINOPHEN 500 MG PO TABS
1000.0000 mg | ORAL_TABLET | Freq: Once | ORAL | Status: AC
  Administered 2023-04-19: 1000 mg via ORAL
  Filled 2023-04-19: qty 2

## 2023-04-19 MED ORDER — LEVOFLOXACIN 750 MG PO TABS: 750.0000 mg | ORAL_TABLET | Freq: Every day | ORAL | 0 refills | Status: DC

## 2023-04-19 NOTE — ED Triage Notes (Signed)
Pt is currently being treated for multiple myeloma but comes in tonight with generalized body aches and cough x 2 days. Pt has had fever all day today. Tylenol taken at 1930.

## 2023-04-19 NOTE — ED Provider Notes (Signed)
 [place] EMERGENCY DEPARTMENT AT [facility] Provider Note   CSN: [id number] Arrival date & time: 04/19/23  0005     History  Chief Complaint  Patient presents with   Fever    Tyler Phillips. is a 57 y.o. male.  Patient presents to the emergency department for evaluation of fever.  Patient has a history of multiple myeloma, currently being treated with Velcade .  Patient with myalgias, cough and fever for 2 days.       Home Medications Prior to Admission medications   Medication Sig Start Date End Date Taking? Authorizing Provider  levofloxacin  (LEVAQUIN ) 750 MG tablet Take 1 tablet (750 mg total) by mouth daily. 04/19/23  Yes Ling Flesch, Lonni PARAS, MD  aspirin  325 MG tablet Take 325 mg by mouth daily.    [provider]  bortezomib  IV (VELCADE ) 3.5 MG injection 1 mg/m2 every 14 (fourteen) days.    [provider]  cetirizine  (ZYRTEC ) 10 MG tablet Take 10 mg by mouth daily.    [provider]  Cholecalciferol (VITAMIN D3) 50 MCG (2000 UT) CAPS Take 2,000 Units by mouth.    [provider]  Multiple Vitamin (MULTIVITAMIN WITH MINERALS) TABS tablet Take 1 tablet by mouth daily. 11/17/21   Sheikh, Omair Latif, DO  ondansetron  (ZOFRAN ) 4 MG tablet Take 1 tablet (4 mg total) by mouth every 6 (six) hours as needed for nausea. Patient not taking: Reported on 03/10/2023 11/16/21   Sherrill Cable Latif, DO  valACYclovir  (VALTREX ) 500 MG tablet Take 500 mg by mouth 2 (two) times daily.     [provider]  zolpidem  (AMBIEN ) 10 MG tablet Take 1 tablet (10 mg total) by mouth at bedtime as needed for sleep. 01/15/19 01/14/23  Timmy Maude SAUNDERS, MD      Allergies    Patient has no known allergies.    Review of Systems   Review of Systems  Physical Exam Updated Vital Signs BP 139/74   Pulse 88   Temp (!) 100.6 F (38.1 C) (Oral)   Resp (!) 23   SpO2 95%  Physical Exam Vitals and nursing note reviewed.  Constitutional:       General: He is not in acute distress.    Appearance: He is well-developed.  HENT:     Head: Normocephalic and atraumatic.     Mouth/Throat:     Mouth: Mucous membranes are moist.  Eyes:     General: Vision grossly intact. Gaze aligned appropriately.     Extraocular Movements: Extraocular movements intact.     Conjunctiva/sclera: Conjunctivae normal.  Cardiovascular:     Rate and Rhythm: Normal rate and regular rhythm.     Pulses: Normal pulses.     Heart sounds: Normal heart sounds, S1 normal and S2 normal. No murmur heard.    No friction rub. No gallop.  Pulmonary:     Effort: Pulmonary effort is normal. No respiratory distress.     Breath sounds: Normal breath sounds.  Abdominal:     Palpations: Abdomen is soft.     Tenderness: There is no abdominal tenderness. There is no guarding or rebound.     Hernia: No hernia is present.  Musculoskeletal:        General: No swelling.     Cervical back: Full passive range of motion without pain, normal range of motion and neck supple. No pain with movement, spinous process tenderness or muscular tenderness. Normal range of motion.     Right lower leg:  No edema.     Left lower leg: No edema.  Skin:    General: Skin is warm and dry.     Capillary Refill: Capillary refill takes less than 2 seconds.     Findings: No ecchymosis, erythema, lesion or wound.  Neurological:     Mental Status: He is alert and oriented to person, place, and time.     GCS: GCS eye subscore is 4. GCS verbal subscore is 5. GCS motor subscore is 6.     Cranial Nerves: Cranial nerves 2-12 are intact.     Sensory: Sensation is intact.     Motor: Motor function is intact. No weakness or abnormal muscle tone.     Coordination: Coordination is intact.  Psychiatric:        Mood and Affect: Mood normal.        Speech: Speech normal.        Behavior: Behavior normal.     ED Results / Procedures / Treatments   Labs (all labs ordered are listed, but only abnormal results  are displayed) Labs Reviewed  COMPREHENSIVE METABOLIC PANEL - Abnormal; Notable for the following components:      Result Value   Sodium 129 (*)    CO2 19 (*)    Glucose, Bld 117 (*)    Creatinine, Ser 1.56 (*)    Calcium  8.4 (*)    GFR, Estimated 51 (*)    All other components within normal limits  CBC WITH DIFFERENTIAL/PLATELET - Abnormal; Notable for the following components:   WBC 13.2 (*)    RBC 3.97 (*)    HCT 38.7 (*)    MCH 34.5 (*)    Neutro Abs 11.6 (*)    All other components within normal limits  RESP PANEL BY RT-PCR (RSV, FLU A&B, COVID)  RVPGX2  CULTURE, BLOOD (ROUTINE X 2)  CULTURE, BLOOD (ROUTINE X 2)  PROTIME-INR  APTT  URINALYSIS, W/ REFLEX TO CULTURE (INFECTION SUSPECTED)  I-STAT CG4 LACTIC ACID, ED  I-STAT CG4 LACTIC ACID, ED    EKG None  Radiology DG Chest Port 1 View Result Date: 04/19/2023 CLINICAL DATA:  Multiple myeloma, chills, fever body aches, rule out sepsis EXAM: PORTABLE CHEST 1 VIEW COMPARISON:  Chest x-ray 11/12/2021 FINDINGS: The heart and mediastinal contours are within normal limits. No focal consolidation. No pulmonary edema. No pleural effusion. No pneumothorax. No acute osseous abnormality. IMPRESSION: No active disease. Electronically Signed   By: Morgane  Naveau M.D.   On: 04/19/2023 03:06    Procedures Procedures    Medications Ordered in ED Medications  sodium chloride  0.9 % bolus 1,000 mL (has no administration in time range)  acetaminophen  (TYLENOL ) tablet 1,000 mg (has no administration in time range)    ED Course/ Medical Decision Making/ A&P                                 Medical Decision Making Amount and/or Complexity of Data Reviewed Labs: ordered. Radiology: ordered.   Differential Diagnosis considered includes, but not limited to: COVID-19; influenza; RSV; simple viral URI; pneumonia; sepsis; neutropenic fever  Patient appears well.  He does have a fever at arrival.  No hypotension or tachycardia.   Oxygenation is normal.  Patient currently on chemotherapy for multiple myeloma.  CBC shows leukocytosis, no evidence of neutropenia.  Chest x-ray without pneumonia.  Urinalysis without infection.  Chemistries with mild dehydration, otherwise labs reassuring.  Cultures are  pending.  COVID-19, influenza, RSV negative.  Likely viral etiology, but as he is immunocompromise, will empirically cover with antibiotics while cultures are pending.  Does not require hospitalization at this time.          Final Clinical Impression(s) / ED Diagnoses Final diagnoses:  Fever, unspecified fever cause  Upper respiratory tract infection, unspecified type    Rx / DC Orders ED Discharge Orders          Ordered    levofloxacin  (LEVAQUIN ) 750 MG tablet  Daily        04/19/23 0518              Tyler Lonni PARAS, MD 04/19/23 [phone]

## 2023-04-21 ENCOUNTER — Inpatient Hospital Stay: Payer: Medicare Other

## 2023-04-21 ENCOUNTER — Inpatient Hospital Stay: Payer: Medicare Other | Attending: Hematology & Oncology | Admitting: Hematology & Oncology

## 2023-04-21 ENCOUNTER — Other Ambulatory Visit: Payer: Self-pay | Admitting: Oncology

## 2023-04-21 ENCOUNTER — Encounter: Payer: Self-pay | Admitting: Hematology & Oncology

## 2023-04-21 VITALS — BP 140/89 | HR 64 | Temp 98.0°F | Resp 20 | Ht 73.0 in | Wt 209.0 lb

## 2023-04-21 DIAGNOSIS — C9001 Multiple myeloma in remission: Secondary | ICD-10-CM

## 2023-04-21 DIAGNOSIS — Z5112 Encounter for antineoplastic immunotherapy: Secondary | ICD-10-CM | POA: Diagnosis not present

## 2023-04-21 DIAGNOSIS — C9000 Multiple myeloma not having achieved remission: Secondary | ICD-10-CM | POA: Insufficient documentation

## 2023-04-21 LAB — CMP (CANCER CENTER ONLY)
ALT: 29 U/L (ref 0–44)
AST: 18 U/L (ref 15–41)
Albumin: 4.3 g/dL (ref 3.5–5.0)
Alkaline Phosphatase: 58 U/L (ref 38–126)
Anion gap: 8 (ref 5–15)
BUN: 18 mg/dL (ref 6–20)
CO2: 25 mmol/L (ref 22–32)
Calcium: 9.4 mg/dL (ref 8.9–10.3)
Chloride: 105 mmol/L (ref 98–111)
Creatinine: 1.55 mg/dL — ABNORMAL HIGH (ref 0.61–1.24)
GFR, Estimated: 52 mL/min — ABNORMAL LOW (ref >60)
Glucose, Bld: 97 mg/dL (ref 70–99)
Potassium: 4.7 mmol/L (ref 3.5–5.1)
Sodium: 138 mmol/L (ref 135–145)
Total Bilirubin: 0.5 mg/dL (ref 0.0–1.2)
Total Protein: 7.4 g/dL (ref 6.5–8.1)

## 2023-04-21 LAB — CBC WITH DIFFERENTIAL (CANCER CENTER ONLY)
Abs Immature Granulocytes: 0.02 10*3/uL (ref 0.00–0.07)
Basophils Absolute: 0 10*3/uL (ref 0.0–0.1)
Basophils Relative: 0 %
Eosinophils Absolute: 0.1 10*3/uL (ref 0.0–0.5)
Eosinophils Relative: 1 %
HCT: 37.8 % — ABNORMAL LOW (ref 39.0–52.0)
Hemoglobin: 13.3 g/dL (ref 13.0–17.0)
Immature Granulocytes: 0 %
Lymphocytes Relative: 20 %
Lymphs Abs: 1.2 10*3/uL (ref 0.7–4.0)
MCH: 34.7 pg — ABNORMAL HIGH (ref 26.0–34.0)
MCHC: 35.2 g/dL (ref 30.0–36.0)
MCV: 98.7 fL (ref 80.0–100.0)
Monocytes Absolute: 0.4 10*3/uL (ref 0.1–1.0)
Monocytes Relative: 7 %
Neutro Abs: 4.1 10*3/uL (ref 1.7–7.7)
Neutrophils Relative %: 72 %
Platelet Count: 180 10*3/uL (ref 150–400)
RBC: 3.83 MIL/uL — ABNORMAL LOW (ref 4.22–5.81)
RDW: 11.9 % (ref 11.5–15.5)
WBC Count: 5.7 10*3/uL (ref 4.0–10.5)
nRBC: 0.3 % — ABNORMAL HIGH (ref 0.0–0.2)

## 2023-04-21 LAB — LACTATE DEHYDROGENASE: LDH: 143 U/L (ref 98–192)

## 2023-04-21 MED ORDER — BORTEZOMIB CHEMO SQ INJECTION 3.5 MG (2.5MG/ML)
1.3000 mg/m2 | Freq: Once | INTRAMUSCULAR | Status: AC
  Administered 2023-04-21: 2.75 mg via SUBCUTANEOUS
  Filled 2023-04-21: qty 1.1

## 2023-04-21 MED ORDER — ZOLPIDEM TARTRATE 10 MG PO TABS: 10.0000 mg | ORAL_TABLET | Freq: Every evening | ORAL | 0 refills | Status: DC | PRN

## 2023-04-21 MED ORDER — PROCHLORPERAZINE MALEATE 10 MG PO TABS
10.0000 mg | ORAL_TABLET | Freq: Once | ORAL | Status: DC
Start: 2023-04-21 — End: 2023-04-21

## 2023-04-21 MED ORDER — ZOLPIDEM TARTRATE 10 MG PO TABS: 10.0000 mg | ORAL_TABLET | Freq: Every evening | ORAL | 0 refills | Status: AC | PRN

## 2023-04-21 NOTE — Patient Instructions (Signed)
 [facility]  Discharge Instructions: Thank you for choosing [facility] to provide your oncology and hematology care.   If you have a lab appointment with the Cancer Center, please go directly to the Cancer Center and check in at the registration area.  Wear comfortable clothing and clothing appropriate for easy access to any Portacath or PICC line.   We strive to give you quality time with your provider. You may need to reschedule your appointment if you arrive late (15 or more minutes).  Arriving late affects you and other patients whose appointments are after yours.  Also, if you miss three or more appointments without notifying the office, you may be dismissed from the clinic at the provider's discretion.      For prescription refill requests, have your pharmacy contact our office and allow 72 hours for refills to be completed.    Today you received the following chemotherapy and/or immunotherapy agents Velcade      To help prevent nausea and vomiting after your treatment, we encourage you to take your nausea medication as directed.  BELOW ARE SYMPTOMS THAT SHOULD BE REPORTED IMMEDIATELY: *FEVER GREATER THAN 100.4 F (38 C) OR HIGHER *CHILLS OR SWEATING *NAUSEA AND VOMITING THAT IS NOT CONTROLLED WITH YOUR NAUSEA MEDICATION *UNUSUAL SHORTNESS OF BREATH *UNUSUAL BRUISING OR BLEEDING *URINARY PROBLEMS (pain or burning when urinating, or frequent urination) *BOWEL PROBLEMS (unusual diarrhea, constipation, pain near the anus) TENDERNESS IN MOUTH AND THROAT WITH OR WITHOUT PRESENCE OF ULCERS (sore throat, sores in mouth, or a toothache) UNUSUAL RASH, SWELLING OR PAIN  UNUSUAL VAGINAL DISCHARGE OR ITCHING   Items with * indicate a potential emergency and should be followed up as soon as possible or go to the Emergency Department if any problems should occur.  Please show the CHEMOTHERAPY ALERT CARD or IMMUNOTHERAPY  ALERT CARD at check-in to the Emergency Department and triage nurse. Should you have questions after your visit or need to cancel or reschedule your appointment, please contact [facility] [facility] - A DEPT OF [facility]  [phone] and follow the prompts.  Office hours are 8:00 a.m. to 4:30 p.m. Monday - Friday. Please note that voicemails left after 4:00 p.m. may not be returned until the following business day.  We are closed weekends and major holidays. You have access to a nurse at all times for urgent questions. Please call the main number to the clinic [phone] and follow the prompts.  For any non-urgent questions, you may also contact your provider using MyChart. We now offer e-Visits for anyone 69 and older to request care online for non-urgent symptoms. For details visit [email or web address].   Also download the MyChart app! Go to the app store, search "MyChart", open the app, select [place], and log in with your MyChart username and password.

## 2023-04-21 NOTE — Progress Notes (Signed)
Ok to treat with creatinine 1.55 per Dr. [name]

## 2023-04-21 NOTE — Progress Notes (Signed)
 Hematology and Oncology Follow Up Visit  Tyler Phillips [id number] 15-Feb-1966 58 y.o. 04/21/2023   Principle Diagnosis:  IgG Kappa myeloma - +4, +14, +17   Past Therapy: Status post autologous stem cell transplant on 05/22/2015   S/p cycle 6 of RVD [place]  4 mg po q 2 week -- start on 01/02/2019 -- d/c on 02/10/2019   Current Therapy:        Velcade  q 2wk dosing Revlimid  10mg  po q day (21/7)  - d/c on 12/07/2021   Interim History:  Tyler Phillips is here today for follow-up and treatment.  Surprisingly, he was in the [employer] on New Year's Eve.  He had a temperature of 103.7.  They did a very thorough workup on him.  His white cell count was quite high at 13.2 with a increase in neutrophils.  They did cultures which were all negative.  Urinalysis was unremarkable.  Chest x-ray was unremarkable.  They put him on Levaquin .  I certainly cannot argue with this.  He is on it for 5 days.  His white cell count is come back down to its baseline now.  He has had no rash.  He has had no change in bowel or bladder habits.  He has had no bleeding.  His left foot is doing well.  He no longer has a walking boot.  He did have a very nice holiday season.  His kids came in to town.  He is able to see his little granddaughter.  He has done very well with his myeloma.  There has been no monoclonal spike in his blood.  His IgG level was 1150 mg/dL.  The Kappa light chain was 1.9 mg/dL.  He, as always, has been very active.  He has had no cough.  He has had no diarrhea.  Overall, I would say that his performance status is ECOG 0.  .    Medications:  Allergies as of 04/21/2023   No Known Allergies      Medication List        Accurate as of April 21, 2023  9:45 AM. If you have any questions, ask your nurse or doctor.          aspirin  325 MG tablet Take 325 mg by mouth daily.   bortezomib  IV 3.5 MG injection Commonly known as: VELCADE  1 mg/m2 every 14 (fourteen) days.    cetirizine  10 MG tablet Commonly known as: ZYRTEC  Take 10 mg by mouth daily.   Jatenzo 237 MG Caps Generic drug: Testosterone  Undecanoate Take 1 capsule by mouth 2 (two) times daily.   levofloxacin  750 MG tablet Commonly known as: Levaquin  Take 1 tablet (750 mg total) by mouth daily.   multivitamin with minerals Tabs tablet Take 1 tablet by mouth daily.   ondansetron  4 MG tablet Commonly known as: ZOFRAN  Take 1 tablet (4 mg total) by mouth every 6 (six) hours as needed for nausea.   valACYclovir  500 MG tablet Commonly known as: VALTREX  Take 500 mg by mouth 2 (two) times daily.   vitamin D3 50 MCG (2000 UT) Caps Take 2,000 Units by mouth.   zolpidem  10 MG tablet Commonly known as: AMBIEN  Take 1 tablet (10 mg total) by mouth at bedtime as needed for sleep.        Allergies: No Known Allergies  Past Medical History, Surgical history, Social history, and Family History were reviewed and updated.  Review of Systems: Review of Systems  Constitutional: Negative.  HENT: Negative.    Eyes: Negative.   Respiratory: Negative.    Cardiovascular: Negative.   Gastrointestinal: Negative.   Genitourinary: Negative.   Musculoskeletal: Negative.   Skin: Negative.   Neurological: Negative.   Endo/Heme/Allergies: Negative.   Psychiatric/Behavioral: Negative.     SABRA   Physical Exam:  height is 6' 1 (1.854 m) and weight is 209 lb (94.8 kg). His oral temperature is 98 F (36.7 C). His blood pressure is 140/89 (abnormal) and his pulse is 64. His respiration is 20 and oxygen saturation is 100%.   Wt Readings from Last 3 Encounters:  04/21/23 209 lb (94.8 kg)  03/10/23 219 lb 1.3 oz (99.4 kg)  01/30/23 217 lb (98.4 kg)    Physical Exam Vitals reviewed.  HENT:     Head: Normocephalic and atraumatic.  Eyes:     Pupils: Pupils are equal, round, and reactive to light.  Cardiovascular:     Rate and Rhythm: Normal rate and regular rhythm.     Heart sounds: Normal heart  sounds.  Pulmonary:     Effort: Pulmonary effort is normal.     Breath sounds: Normal breath sounds.  Abdominal:     General: Bowel sounds are normal.     Palpations: Abdomen is soft.  Musculoskeletal:        General: No tenderness or deformity. Normal range of motion.     Cervical back: Normal range of motion.     Comments: Extremities shows a walking boot on the left foot.  Otherwise, extremities do not show any edema.  He has good range of motion of his joints that he can move.  There is no swelling noted.  There is no erythema.  Lymphadenopathy:     Cervical: No cervical adenopathy.  Skin:    General: Skin is warm and dry.     Findings: No erythema or rash.  Neurological:     Mental Status: He is alert and oriented to person, place, and time.  Psychiatric:        Behavior: Behavior normal.        Thought Content: Thought content normal.        Judgment: Judgment normal.      Lab Results  Component Value Date   WBC 5.7 04/21/2023   HGB 13.3 04/21/2023   HCT 37.8 (L) 04/21/2023   MCV 98.7 04/21/2023   PLT 180 04/21/2023   Lab Results  Component Value Date   FERRITIN 1,263 (H) 07/20/2014   IRON 125 07/20/2014   TIBC 198 (L) 07/20/2014   UIBC 73 (L) 07/20/2014   IRONPCTSAT 63 (H) 07/20/2014   Lab Results  Component Value Date   RETICCTPCT 0.8 07/20/2014   RBC 3.83 (L) 04/21/2023   Lab Results  Component Value Date   KPAFRELGTCHN 18.6 03/10/2023   LAMBDASER 18.5 03/10/2023   KAPLAMBRATIO 1.01 03/10/2023   Lab Results  Component Value Date   IGGSERUM 1,150 03/10/2023   IGA 152 03/10/2023   IGMSERUM 34 03/10/2023   Lab Results  Component Value Date   TOTALPROTELP 6.9 03/10/2023   ALBUMINELP 4.0 03/10/2023   A1GS 0.2 03/10/2023   A2GS 0.6 03/10/2023   BETS 1.0 03/10/2023   BETA2SER 0.3 03/28/2015   GAMS 1.1 03/10/2023   MSPIKE Not Observed 03/10/2023   SPEI Comment 09/27/2022     Chemistry      Component Value Date/Time   NA 138 04/21/2023 0902    NA 140 04/22/2017 1140   NA 138 09/03/2015 1042  K 4.7 04/21/2023 0902   K 4.0 04/22/2017 1140   K 4.3 09/03/2015 1042   CL 105 04/21/2023 0902   CL 105 04/22/2017 1140   CO2 25 04/21/2023 0902   CO2 24 04/22/2017 1140   CO2 25 09/03/2015 1042   BUN 18 04/21/2023 0902   BUN 16 04/22/2017 1140   BUN 21.7 09/03/2015 1042   CREATININE 1.55 (H) 04/21/2023 0902   CREATININE 1.6 (H) 04/22/2017 1140   CREATININE 1.2 09/03/2015 1042      Component Value Date/Time   CALCIUM  9.4 04/21/2023 0902   CALCIUM  8.5 04/22/2017 1140   CALCIUM  9.1 09/03/2015 1042   ALKPHOS 58 04/21/2023 0902   ALKPHOS 59 04/22/2017 1140   ALKPHOS 42 09/03/2015 1042   AST 18 04/21/2023 0902   AST 27 09/03/2015 1042   ALT 29 04/21/2023 0902   ALT 45 04/22/2017 1140   ALT 41 09/03/2015 1042   BILITOT 0.5 04/21/2023 0902   BILITOT 0.54 09/03/2015 1042       Impression and Plan: Mr. Chaloux is a very pleasant 58 yo caucasian gentleman with IgG kappa myeloma. He underwent induction chemotherapy with RVD followed by an autologous stem cell transplant at [facility] on February 2017.   I will now be 8 years in February that he had his transplant.  Again, he is doing quite nicely GIST on single agent Velcade .  I am not sure as to why he had this temperature.  By his labs, it looks like he had an infection.  However, all cultures were negative.  We will go ahead and plan for treatment.  I will plan to get him back in another 6 weeks to see us .   Maude JONELLE Crease, MD 1/2/20259:45 AM

## 2023-04-22 LAB — KAPPA/LAMBDA LIGHT CHAINS
Kappa free light chain: 20.8 mg/L — ABNORMAL HIGH (ref 3.3–19.4)
Kappa, lambda light chain ratio: 1.18 (ref 0.26–1.65)
Lambda free light chains: 17.7 mg/L (ref 5.7–26.3)

## 2023-04-22 LAB — IGG, IGA, IGM
IgA: 136 mg/dL (ref 90–386)
IgG (Immunoglobin G), Serum: 1094 mg/dL (ref 603–1613)
IgM (Immunoglobulin M), Srm: 26 mg/dL (ref 20–172)

## 2023-04-24 LAB — CULTURE, BLOOD (ROUTINE X 2)
Culture: NO GROWTH
Culture: NO GROWTH
Special Requests: ADEQUATE
Special Requests: ADEQUATE

## 2023-04-25 LAB — PROTEIN ELECTROPHORESIS, SERUM, WITH REFLEX
A/G Ratio: 1.2 (ref 0.7–1.7)
Albumin ELP: 3.7 g/dL (ref 2.9–4.4)
Alpha-1-Globulin: 0.3 g/dL (ref 0.0–0.4)
Alpha-2-Globulin: 0.8 g/dL (ref 0.4–1.0)
Beta Globulin: 0.9 g/dL (ref 0.7–1.3)
Gamma Globulin: 1 g/dL (ref 0.4–1.8)
Globulin, Total: 3.1 g/dL (ref 2.2–3.9)
Total Protein ELP: 6.8 g/dL (ref 6.0–8.5)

## 2023-04-26 ENCOUNTER — Other Ambulatory Visit: Payer: Self-pay

## 2023-05-05 ENCOUNTER — Inpatient Hospital Stay: Payer: Medicare Other

## 2023-05-05 VITALS — BP 130/84 | HR 57 | Temp 97.6°F | Resp 18 | Ht 78.0 in | Wt 219.0 lb

## 2023-05-05 DIAGNOSIS — Z5112 Encounter for antineoplastic immunotherapy: Secondary | ICD-10-CM | POA: Diagnosis not present

## 2023-05-05 DIAGNOSIS — C9001 Multiple myeloma in remission: Secondary | ICD-10-CM

## 2023-05-05 DIAGNOSIS — C9000 Multiple myeloma not having achieved remission: Secondary | ICD-10-CM | POA: Diagnosis not present

## 2023-05-05 LAB — CBC WITH DIFFERENTIAL (CANCER CENTER ONLY)
Abs Immature Granulocytes: 0.01 10*3/uL (ref 0.00–0.07)
Basophils Absolute: 0 10*3/uL (ref 0.0–0.1)
Basophils Relative: 1 %
Eosinophils Absolute: 0.1 10*3/uL (ref 0.0–0.5)
Eosinophils Relative: 2 %
HCT: 41.4 % (ref 39.0–52.0)
Hemoglobin: 14.2 g/dL (ref 13.0–17.0)
Immature Granulocytes: 0 %
Lymphocytes Relative: 26 %
Lymphs Abs: 1.2 10*3/uL (ref 0.7–4.0)
MCH: 34.1 pg — ABNORMAL HIGH (ref 26.0–34.0)
MCHC: 34.3 g/dL (ref 30.0–36.0)
MCV: 99.3 fL (ref 80.0–100.0)
Monocytes Absolute: 0.3 10*3/uL (ref 0.1–1.0)
Monocytes Relative: 7 %
Neutro Abs: 3 10*3/uL (ref 1.7–7.7)
Neutrophils Relative %: 64 %
Platelet Count: 177 10*3/uL (ref 150–400)
RBC: 4.17 MIL/uL — ABNORMAL LOW (ref 4.22–5.81)
RDW: 12.2 % (ref 11.5–15.5)
WBC Count: 4.6 10*3/uL (ref 4.0–10.5)
nRBC: 0 % (ref 0.0–0.2)

## 2023-05-05 LAB — CMP (CANCER CENTER ONLY)
ALT: 31 U/L (ref 0–44)
AST: 21 U/L (ref 15–41)
Albumin: 4.5 g/dL (ref 3.5–5.0)
Alkaline Phosphatase: 53 U/L (ref 38–126)
Anion gap: 6 (ref 5–15)
BUN: 16 mg/dL (ref 6–20)
CO2: 28 mmol/L (ref 22–32)
Calcium: 9.1 mg/dL (ref 8.9–10.3)
Chloride: 103 mmol/L (ref 98–111)
Creatinine: 1.34 mg/dL — ABNORMAL HIGH (ref 0.61–1.24)
GFR, Estimated: >60 mL/min (ref >60)
Glucose, Bld: 95 mg/dL (ref 70–99)
Potassium: 4.4 mmol/L (ref 3.5–5.1)
Sodium: 137 mmol/L (ref 135–145)
Total Bilirubin: 0.4 mg/dL (ref 0.0–1.2)
Total Protein: 7.1 g/dL (ref 6.5–8.1)

## 2023-05-05 MED ORDER — PROCHLORPERAZINE MALEATE 10 MG PO TABS
10.0000 mg | ORAL_TABLET | Freq: Once | ORAL | Status: DC
Start: 2023-05-05 — End: 2023-05-05

## 2023-05-05 MED ORDER — BORTEZOMIB CHEMO SQ INJECTION 3.5 MG (2.5MG/ML)
1.3000 mg/m2 | Freq: Once | INTRAMUSCULAR | Status: AC
Start: 2023-05-05 — End: 2023-05-05
  Administered 2023-05-05: 2.75 mg via SUBCUTANEOUS
  Filled 2023-05-05: qty 1.1

## 2023-05-11 ENCOUNTER — Encounter: Payer: Self-pay | Admitting: Gastroenterology

## 2023-05-11 ENCOUNTER — Ambulatory Visit: Payer: Medicare Other | Admitting: Gastroenterology

## 2023-05-11 VITALS — BP 130/80 | HR 68 | Ht 72.0 in | Wt 223.5 lb

## 2023-05-11 DIAGNOSIS — C9001 Multiple myeloma in remission: Secondary | ICD-10-CM | POA: Diagnosis not present

## 2023-05-11 DIAGNOSIS — Z860100 Personal history of colon polyps, unspecified: Secondary | ICD-10-CM

## 2023-05-11 MED ORDER — SUFLAVE 178.7 G PO SOLR: 1.0000 | Freq: Once | ORAL | 0 refills | Status: AC

## 2023-05-11 NOTE — Progress Notes (Signed)
Chief Complaint:    History of colon polyps, discuss colonoscopy  HPI:     Patient is a 58 y.o. male with a history of multiple myeloma  s/p chemotherapy and stem cell transplant 2017 (on Velcade), pancytopenia  2/2 myeloma, history of colon polyps, presenting to the Gastroenterology Clinic to discuss colonoscopy for ongoing polyp surveillance.  He is otherwise without any GI symptoms.  No hematochezia, melena, change in bowel habits, abdominal pain, etc.  No known family history of colon cancer or related malignancy.  He was seen in the GI clinic by Willette Cluster on 09/17/2021 to establish care and evaluation of loose stools.  Loose stools started after initiation of therapy for myeloma.  Was treated with Imodium on demand with good response.    Endoscopic History: - 02/07/2018: Colonoscopy at [facility]: 2 mm flat transverse colon polyp (tubular adenoma), diverticulosis in the sigmoid and ascending colon. Repeat 5 years  He follows with Dr. Myna Hidalgo in the Hematology/Oncology clinic, last seen on 04/21/2023.  Currently on Velcade.  Completed Revlimid in 11/2021.  Reviewed labs from earlier this month: Normal liver enzymes, stable renal function with BUN/creatinine 16/1.34, otherwise normal CMP.  Normal WBC, H/H, plt.  Normal PT/INR last month.  CT C/A/P in 10/2021 with normal-appearing liver, pancreas, GI tract.  Spleen mildly enlarged.  No lymphadenopathy.    Review of systems:     No chest pain, no SOB, no fevers, no urinary sx   Past Medical History:  Diagnosis Date   Adenomatous colon polyp    Anemia    Elevated LFTs    Gallbladder polyp    Multiple myeloma (HCC)    Pneumonia     Patient's surgical history, family medical history, social history, medications and allergies were all reviewed in Epic    Current Outpatient Medications  Medication Sig Dispense Refill   aspirin 325 MG tablet Take 325 mg by mouth daily.     bortezomib IV (VELCADE) 3.5 MG injection 1 mg/m2 every 14  (fourteen) days.     cetirizine (ZYRTEC) 10 MG tablet Take 10 mg by mouth daily.     Cholecalciferol (VITAMIN D3) 50 MCG (2000 UT) CAPS Take 2,000 Units by mouth.     JATENZO 237 MG CAPS Take 1 capsule by mouth 2 (two) times daily.     levofloxacin (LEVAQUIN) 750 MG tablet Take 1 tablet (750 mg total) by mouth daily. 5 tablet 0   Multiple Vitamin (MULTIVITAMIN WITH MINERALS) TABS tablet Take 1 tablet by mouth daily. 30 tablet 0   ondansetron (ZOFRAN) 4 MG tablet Take 1 tablet (4 mg total) by mouth every 6 (six) hours as needed for nausea. 20 tablet 0   valACYclovir (VALTREX) 500 MG tablet Take 500 mg by mouth 2 (two) times daily.      zolpidem (AMBIEN) 10 MG tablet Take 1 tablet (10 mg total) by mouth at bedtime as needed for sleep. 30 tablet 0   No current facility-administered medications for this visit.    Physical Exam:     BP 130/80 (BP Location: Left Arm, Patient Position: Sitting, Cuff Size: Large)   Pulse 68   Ht 6' (1.829 m)   Wt 223 lb 8 oz (101.4 kg)   BMI 30.31 kg/m   GENERAL:  Pleasant male in NAD PSYCH: : Cooperative, normal affect CARDIAC:  RRR, no murmur heard, no peripheral edema PULM: Normal respiratory effort, lungs CTA bilaterally, no wheezing ABDOMEN:  Nondistended, soft, nontender SKIN:  turgor, no lesions seen  Musculoskeletal:  Normal muscle tone, normal strength NEURO: Alert and oriented x 3, no focal neurologic deficits   IMPRESSION and PLAN:    1) History of colon polyps - Due for repeat colonoscopy for ongoing polyp surveillance - Schedule colonoscopy  2) History of multiple myeloma - Continue regular follow-up in the Hematology clinic with Dr. Myna Hidalgo      The indications, risks, and benefits of colonoscopy were explained to the patient in detail. Risks include but are not limited to bleeding, perforation, adverse reaction to medications, and cardiopulmonary compromise. Sequelae include but are not limited to the possibility of surgery,  hospitalization, and mortality. The patient verbalized understanding and wished to proceed. All questions answered, referred to the scheduler and bowel prep ordered. Further recommendations pending results of the exam.        Shellia Cleverly ,DO, FACG 05/11/2023, 8:56 AM

## 2023-05-11 NOTE — Patient Instructions (Signed)
_______________________________________________________  If your blood pressure at your visit was 140/90 or greater, please contact your primary care physician to follow up on this.  If you are age 59 or younger, your body mass index should be between 19-25. Your Body mass index is 30.31 kg/m. If this is out of the aformentioned range listed, please consider follow up with your Primary Care Provider.  ________________________________________________________  The  GI providers would like to encourage you to use [facility] to communicate with providers for non-urgent requests or questions.  Due to long hold times on the telephone, sending your provider a message by [facility] may be a faster and more efficient way to get a response.  Please allow 48 business hours for a response.  Please remember that this is for non-urgent requests.  _______________________________________________________  Bonita Quin have been scheduled for a colonoscopy. Please follow written instructions given to you at your visit today.   Please pick up your prep supplies at the pharmacy within the next 1-3 days.  If you use inhalers (even only as needed), please bring them with you on the day of your procedure.  DO NOT TAKE 7 DAYS PRIOR TO TEST- Trulicity (dulaglutide) Ozempic, Wegovy (semaglutide) Mounjaro (tirzepatide) Bydureon Bcise (exanatide extended release)  DO NOT TAKE 1 DAY PRIOR TO YOUR TEST Rybelsus (semaglutide) Adlyxin (lixisenatide) Victoza (liraglutide) Byetta (exanatide) ___________________________________________________________________________  Due to recent changes in healthcare laws, you may see the results of your imaging and laboratory studies on MyChart before your provider has had a chance to review them.  We understand that in some cases there may be results that are confusing or concerning to you. Not all laboratory results come back in the same time frame and the provider may be waiting for  multiple results in order to interpret others.  Please give [place] 48 hours in order for your provider to thoroughly review all the results before contacting the office for clarification of your results.   You will receive your bowel preparation through [facility], which ensures the lowest copay and home delivery, with outreach via text or call from an [phone] number. Please respond promptly to avoid rescheduling. If you are interested in alternative options or have any questions please contact them at [phone]  Your Provider Has Sent Your Bowel Prep Regimen To [facility] What to expect. [facility] will contact you to verify your information and collect your copay, if applicable. Enjoy the comfort of your home while we deliver your prescription to you, free of any shipping charges. Fast, FREE delivery or shipping. [facility] accepts all major insurance benefits and applies discounts & coupons  Have additional questions? [facility]'s patient care team is always here to help.  Chat: [email or web address] Call: [phone] Email: [email or web address] NCPDP: [id number] How will we contact you? Welcome Phone call  a Welcome text and a Checkout link in a text Texts you receive from [phone] Are Not Spam.   *To set up delivery, you must complete the checkout process via link or speak to one of our patient care representatives. If we are unable to reach you, your prescription may be delayed.  It was a pleasure to see you today!  Vito Cirigliano, D.O.

## 2023-05-12 ENCOUNTER — Other Ambulatory Visit: Payer: Self-pay

## 2023-05-19 ENCOUNTER — Inpatient Hospital Stay: Payer: Medicare Other | Admitting: Medical Oncology

## 2023-05-19 ENCOUNTER — Inpatient Hospital Stay: Payer: Medicare Other

## 2023-05-19 VITALS — BP 142/83 | HR 57 | Temp 97.8°F | Resp 17

## 2023-05-19 DIAGNOSIS — C9001 Multiple myeloma in remission: Secondary | ICD-10-CM

## 2023-05-19 DIAGNOSIS — C9000 Multiple myeloma not having achieved remission: Secondary | ICD-10-CM | POA: Diagnosis not present

## 2023-05-19 DIAGNOSIS — Z5112 Encounter for antineoplastic immunotherapy: Secondary | ICD-10-CM | POA: Diagnosis not present

## 2023-05-19 LAB — CMP (CANCER CENTER ONLY)
ALT: 26 U/L (ref 0–44)
AST: 20 U/L (ref 15–41)
Albumin: 4.5 g/dL (ref 3.5–5.0)
Alkaline Phosphatase: 51 U/L (ref 38–126)
Anion gap: 8 (ref 5–15)
BUN: 13 mg/dL (ref 6–20)
CO2: 25 mmol/L (ref 22–32)
Calcium: 8.8 mg/dL — ABNORMAL LOW (ref 8.9–10.3)
Chloride: 106 mmol/L (ref 98–111)
Creatinine: 1.38 mg/dL — ABNORMAL HIGH (ref 0.61–1.24)
GFR, Estimated: 59 mL/min — ABNORMAL LOW (ref >60)
Glucose, Bld: 96 mg/dL (ref 70–99)
Potassium: 4.5 mmol/L (ref 3.5–5.1)
Sodium: 139 mmol/L (ref 135–145)
Total Bilirubin: 0.8 mg/dL (ref 0.0–1.2)
Total Protein: 7 g/dL (ref 6.5–8.1)

## 2023-05-19 LAB — CBC WITH DIFFERENTIAL (CANCER CENTER ONLY)
Abs Immature Granulocytes: 0 10*3/uL (ref 0.00–0.07)
Basophils Absolute: 0 10*3/uL (ref 0.0–0.1)
Basophils Relative: 0 %
Eosinophils Absolute: 0.1 10*3/uL (ref 0.0–0.5)
Eosinophils Relative: 1 %
HCT: 41.7 % (ref 39.0–52.0)
Hemoglobin: 14.7 g/dL (ref 13.0–17.0)
Immature Granulocytes: 0 %
Lymphocytes Relative: 27 %
Lymphs Abs: 1.2 10*3/uL (ref 0.7–4.0)
MCH: 34.5 pg — ABNORMAL HIGH (ref 26.0–34.0)
MCHC: 35.3 g/dL (ref 30.0–36.0)
MCV: 97.9 fL (ref 80.0–100.0)
Monocytes Absolute: 0.4 10*3/uL (ref 0.1–1.0)
Monocytes Relative: 8 %
Neutro Abs: 2.8 10*3/uL (ref 1.7–7.7)
Neutrophils Relative %: 64 %
Platelet Count: 143 10*3/uL — ABNORMAL LOW (ref 150–400)
RBC: 4.26 MIL/uL (ref 4.22–5.81)
RDW: 12.2 % (ref 11.5–15.5)
WBC Count: 4.5 10*3/uL (ref 4.0–10.5)
nRBC: 0 % (ref 0.0–0.2)

## 2023-05-19 MED ORDER — BORTEZOMIB CHEMO SQ INJECTION 3.5 MG (2.5MG/ML)
1.3000 mg/m2 | Freq: Once | INTRAMUSCULAR | Status: AC
  Administered 2023-05-19: 2.75 mg via SUBCUTANEOUS
  Filled 2023-05-19: qty 1.1

## 2023-05-19 MED ORDER — PROCHLORPERAZINE MALEATE 10 MG PO TABS: 10.0000 mg | ORAL_TABLET | Freq: Once | ORAL | Status: DC

## 2023-05-19 NOTE — Patient Instructions (Signed)
[facility]  Discharge Instructions: Thank you for choosing [facility] to provide your oncology and hematology care.   If you have a lab appointment with the Cancer Center, please go directly to the Cancer Center and check in at the registration area.  Wear comfortable clothing and clothing appropriate for easy access to any Portacath or PICC line.   We strive to give you quality time with your provider. You may need to reschedule your appointment if you arrive late (15 or more minutes).  Arriving late affects you and other patients whose appointments are after yours.  Also, if you miss three or more appointments without notifying the office, you may be dismissed from the clinic at the provider's discretion.      For prescription refill requests, have your pharmacy contact our office and allow 72 hours for refills to be completed.    Today you received the following chemotherapy and/or immunotherapy agents:  Velcade      To help prevent nausea and vomiting after your treatment, we encourage you to take your nausea medication as directed.  BELOW ARE SYMPTOMS THAT SHOULD BE REPORTED IMMEDIATELY: *FEVER GREATER THAN 100.4 F (38 C) OR HIGHER *CHILLS OR SWEATING *NAUSEA AND VOMITING THAT IS NOT CONTROLLED WITH YOUR NAUSEA MEDICATION *UNUSUAL SHORTNESS OF BREATH *UNUSUAL BRUISING OR BLEEDING *URINARY PROBLEMS (pain or burning when urinating, or frequent urination) *BOWEL PROBLEMS (unusual diarrhea, constipation, pain near the anus) TENDERNESS IN MOUTH AND THROAT WITH OR WITHOUT PRESENCE OF ULCERS (sore throat, sores in mouth, or a toothache) UNUSUAL RASH, SWELLING OR PAIN  UNUSUAL VAGINAL DISCHARGE OR ITCHING   Items with * indicate a potential emergency and should be followed up as soon as possible or go to the Emergency Department if any problems should occur.  Please show the CHEMOTHERAPY ALERT CARD or IMMUNOTHERAPY  ALERT CARD at check-in to the Emergency Department and triage nurse. Should you have questions after your visit or need to cancel or reschedule your appointment, please contact [facility] - A DEPT OF [facility]  [phone] and follow the prompts.  Office hours are 8:00 a.m. to 4:30 p.m. Monday - Friday. Please note that voicemails left after 4:00 p.m. may not be returned until the following business day.  We are closed weekends and major holidays. You have access to a nurse at all times for urgent questions. Please call the main number to the clinic [phone] and follow the prompts.  For any non-urgent questions, you may also contact your provider using MyChart. We now offer e-Visits for anyone 68 and older to request care online for non-urgent symptoms. For details visit [email or web address].   Also download the MyChart app! Go to the app store, search "MyChart", open the app, select [address], and log in with your MyChart username and password.

## 2023-05-23 DIAGNOSIS — C9001 Multiple myeloma in remission: Secondary | ICD-10-CM | POA: Diagnosis not present

## 2023-06-02 ENCOUNTER — Inpatient Hospital Stay: Payer: Medicare Other

## 2023-06-02 ENCOUNTER — Other Ambulatory Visit: Payer: Self-pay

## 2023-06-02 ENCOUNTER — Inpatient Hospital Stay: Payer: Medicare Other | Attending: Hematology & Oncology

## 2023-06-02 ENCOUNTER — Encounter: Payer: Self-pay | Admitting: Hematology & Oncology

## 2023-06-02 ENCOUNTER — Inpatient Hospital Stay (HOSPITAL_BASED_OUTPATIENT_CLINIC_OR_DEPARTMENT_OTHER): Payer: Medicare Other | Admitting: Hematology & Oncology

## 2023-06-02 VITALS — BP 138/95 | HR 61 | Temp 97.9°F | Resp 18 | Ht 78.0 in | Wt 223.0 lb

## 2023-06-02 DIAGNOSIS — C9000 Multiple myeloma not having achieved remission: Secondary | ICD-10-CM | POA: Diagnosis not present

## 2023-06-02 DIAGNOSIS — C9001 Multiple myeloma in remission: Secondary | ICD-10-CM

## 2023-06-02 DIAGNOSIS — Z5112 Encounter for antineoplastic immunotherapy: Secondary | ICD-10-CM | POA: Insufficient documentation

## 2023-06-02 LAB — CBC WITH DIFFERENTIAL (CANCER CENTER ONLY)
Abs Immature Granulocytes: 0.01 10*3/uL (ref 0.00–0.07)
Basophils Absolute: 0 10*3/uL (ref 0.0–0.1)
Basophils Relative: 0 %
Eosinophils Absolute: 0.1 10*3/uL (ref 0.0–0.5)
Eosinophils Relative: 2 %
HCT: 43.4 % (ref 39.0–52.0)
Hemoglobin: 15.1 g/dL (ref 13.0–17.0)
Immature Granulocytes: 0 %
Lymphocytes Relative: 26 %
Lymphs Abs: 1.2 10*3/uL (ref 0.7–4.0)
MCH: 34.4 pg — ABNORMAL HIGH (ref 26.0–34.0)
MCHC: 34.8 g/dL (ref 30.0–36.0)
MCV: 98.9 fL (ref 80.0–100.0)
Monocytes Absolute: 0.3 10*3/uL (ref 0.1–1.0)
Monocytes Relative: 7 %
Neutro Abs: 3.1 10*3/uL (ref 1.7–7.7)
Neutrophils Relative %: 65 %
Platelet Count: 158 10*3/uL (ref 150–400)
RBC: 4.39 MIL/uL (ref 4.22–5.81)
RDW: 12.5 % (ref 11.5–15.5)
WBC Count: 4.7 10*3/uL (ref 4.0–10.5)
nRBC: 0 % (ref 0.0–0.2)

## 2023-06-02 LAB — CMP (CANCER CENTER ONLY)
ALT: 37 U/L (ref 0–44)
AST: 26 U/L (ref 15–41)
Albumin: 4.5 g/dL (ref 3.5–5.0)
Alkaline Phosphatase: 49 U/L (ref 38–126)
Anion gap: 6 (ref 5–15)
BUN: 15 mg/dL (ref 6–20)
CO2: 28 mmol/L (ref 22–32)
Calcium: 9.2 mg/dL (ref 8.9–10.3)
Chloride: 106 mmol/L (ref 98–111)
Creatinine: 1.35 mg/dL — ABNORMAL HIGH (ref 0.61–1.24)
GFR, Estimated: >60 mL/min (ref >60)
Glucose, Bld: 89 mg/dL (ref 70–99)
Potassium: 4.7 mmol/L (ref 3.5–5.1)
Sodium: 140 mmol/L (ref 135–145)
Total Bilirubin: 0.6 mg/dL (ref 0.0–1.2)
Total Protein: 7 g/dL (ref 6.5–8.1)

## 2023-06-02 LAB — LACTATE DEHYDROGENASE: LDH: 136 U/L (ref 98–192)

## 2023-06-02 MED ORDER — PROCHLORPERAZINE MALEATE 10 MG PO TABS: 10.0000 mg | ORAL_TABLET | Freq: Once | ORAL | Status: DC

## 2023-06-02 MED ORDER — BORTEZOMIB CHEMO SQ INJECTION 3.5 MG (2.5MG/ML)
1.3000 mg/m2 | Freq: Once | INTRAMUSCULAR | Status: AC
  Administered 2023-06-02: 2.75 mg via SUBCUTANEOUS
  Filled 2023-06-02: qty 1.1

## 2023-06-02 NOTE — Patient Instructions (Signed)
[facility]  Discharge Instructions: Thank you for choosing [facility] to provide your oncology and hematology care.   If you have a lab appointment with the Cancer Center, please go directly to the Cancer Center and check in at the registration area.  Wear comfortable clothing and clothing appropriate for easy access to any Portacath or PICC line.   We strive to give you quality time with your provider. You may need to reschedule your appointment if you arrive late (15 or more minutes).  Arriving late affects you and other patients whose appointments are after yours.  Also, if you miss three or more appointments without notifying the office, you may be dismissed from the clinic at the provider's discretion.      For prescription refill requests, have your pharmacy contact our office and allow 72 hours for refills to be completed.    Today you received the following chemotherapy and/or immunotherapy agents Velcade      To help prevent nausea and vomiting after your treatment, we encourage you to take your nausea medication as directed.  BELOW ARE SYMPTOMS THAT SHOULD BE REPORTED IMMEDIATELY: *FEVER GREATER THAN 100.4 F (38 C) OR HIGHER *CHILLS OR SWEATING *NAUSEA AND VOMITING THAT IS NOT CONTROLLED WITH YOUR NAUSEA MEDICATION *UNUSUAL SHORTNESS OF BREATH *UNUSUAL BRUISING OR BLEEDING *URINARY PROBLEMS (pain or burning when urinating, or frequent urination) *BOWEL PROBLEMS (unusual diarrhea, constipation, pain near the anus) TENDERNESS IN MOUTH AND THROAT WITH OR WITHOUT PRESENCE OF ULCERS (sore throat, sores in mouth, or a toothache) UNUSUAL RASH, SWELLING OR PAIN  UNUSUAL VAGINAL DISCHARGE OR ITCHING   Items with * indicate a potential emergency and should be followed up as soon as possible or go to the Emergency Department if any problems should occur.  Please show the CHEMOTHERAPY ALERT CARD or IMMUNOTHERAPY  ALERT CARD at check-in to the Emergency Department and triage nurse. Should you have questions after your visit or need to cancel or reschedule your appointment, please contact [facility] [facility] - A DEPT OF [facility]  [phone] and follow the prompts.  Office hours are 8:00 a.m. to 4:30 p.m. Monday - Friday. Please note that voicemails left after 4:00 p.m. may not be returned until the following business day.  We are closed weekends and major holidays. You have access to a nurse at all times for urgent questions. Please call the main number to the clinic [phone] and follow the prompts.  For any non-urgent questions, you may also contact your provider using MyChart. We now offer e-Visits for anyone 69 and older to request care online for non-urgent symptoms. For details visit [email or web address].   Also download the MyChart app! Go to the app store, search "MyChart", open the app, select [place], and log in with your MyChart username and password.

## 2023-06-02 NOTE — Progress Notes (Signed)
Hematology and Oncology Follow Up Visit  Tyler Phillips [id number] 16-Apr-1966 57 y.o. 06/02/2023   Principle Diagnosis:  IgG Kappa myeloma - +4, +14, +17   Past Therapy: Status post autologous stem cell transplant on 05/22/2015   S/p cycle 6 of RVD Ninlaro 4 mg po q 2 week -- start on 01/02/2019 -- d/c on 02/10/2019   Current Therapy:        Velcade q 2wk dosing Revlimid 10mg  po q day (21/7)  - d/c on 12/07/2021   Interim History:  Tyler Phillips is here today for follow-up and treatment.  He is doing much better.  He looks great.  As always, he is quite active.  He has had no issues since we last saw him.  He I think he can be going down to [place].  He will be going on a cruise down there.  I know that he will have a good time.  He is doing well on the Velcade.  So far, there has been no evidence of disease progression.  When we last checked his monoclonal studies, there was no monoclonal spike in his blood.  His IgG level was 1094 mg/dL.  His kappa light chain was 2.1 mg/dL.  He has had no issues with nausea or vomiting.  There is been no change in bowel or bladder habits.  He has had no rashes.  There has been no bleeding.  Overall, I would say that his performance status is probably ECOG 0.  Medications:  Allergies as of 06/02/2023   No Known Allergies      Medication List        Accurate as of June 02, 2023  9:01 AM. If you have any questions, ask your nurse or doctor.          aspirin 325 MG tablet Take 325 mg by mouth daily.   bortezomib IV 3.5 MG injection Commonly known as: VELCADE 1 mg/m2 every 14 (fourteen) days.   cetirizine 10 MG tablet Commonly known as: ZYRTEC Take 10 mg by mouth daily.   Jatenzo 237 MG Caps Generic drug: Testosterone Undecanoate Take 1 capsule by mouth 2 (two) times daily.   levofloxacin 750 MG tablet Commonly known as: Levaquin Take 1 tablet (750 mg total) by mouth daily.   multivitamin with minerals Tabs tablet Take  1 tablet by mouth daily.   ondansetron 4 MG tablet Commonly known as: ZOFRAN Take 1 tablet (4 mg total) by mouth every 6 (six) hours as needed for nausea.   valACYclovir 500 MG tablet Commonly known as: VALTREX Take 500 mg by mouth 2 (two) times daily.   vitamin D3 50 MCG (2000 UT) Caps Take 2,000 Units by mouth.   zolpidem 10 MG tablet Commonly known as: AMBIEN Take 1 tablet (10 mg total) by mouth at bedtime as needed for sleep.        Allergies: No Known Allergies  Past Medical History, Surgical history, Social history, and Family History were reviewed and updated.  Review of Systems: Review of Systems  Constitutional: Negative.   HENT: Negative.    Eyes: Negative.   Respiratory: Negative.    Cardiovascular: Negative.   Gastrointestinal: Negative.   Genitourinary: Negative.   Musculoskeletal: Negative.   Skin: Negative.   Neurological: Negative.   Endo/Heme/Allergies: Negative.   Psychiatric/Behavioral: Negative.     Marland Kitchen   Physical Exam:  height is 6\' 6"  (1.981 m) and weight is 223 lb (101.2 kg). His oral temperature is 97.9 F (36.6  C). His blood pressure is 138/95 (abnormal) and his pulse is 61. His respiration is 18 and oxygen saturation is 100%.   Wt Readings from Last 3 Encounters:  06/02/23 223 lb (101.2 kg)  05/11/23 223 lb 8 oz (101.4 kg)  05/05/23 219 lb (99.3 kg)    Physical Exam Vitals reviewed.  HENT:     Head: Normocephalic and atraumatic.  Eyes:     Pupils: Pupils are equal, round, and reactive to light.  Cardiovascular:     Rate and Rhythm: Normal rate and regular rhythm.     Heart sounds: Normal heart sounds.  Pulmonary:     Effort: Pulmonary effort is normal.     Breath sounds: Normal breath sounds.  Abdominal:     General: Bowel sounds are normal.     Palpations: Abdomen is soft.  Musculoskeletal:        General: No tenderness or deformity. Normal range of motion.     Cervical back: Normal range of motion.     Comments:  Extremities shows a walking boot on the left foot.  Otherwise, extremities do not show any edema.  He has good range of motion of his joints that he can move.  There is no swelling noted.  There is no erythema.  Lymphadenopathy:     Cervical: No cervical adenopathy.  Skin:    General: Skin is warm and dry.     Findings: No erythema or rash.  Neurological:     Mental Status: He is alert and oriented to person, place, and time.  Psychiatric:        Behavior: Behavior normal.        Thought Content: Thought content normal.        Judgment: Judgment normal.     Lab Results  Component Value Date   WBC 4.7 06/02/2023   HGB 15.1 06/02/2023   HCT 43.4 06/02/2023   MCV 98.9 06/02/2023   PLT 158 06/02/2023   Lab Results  Component Value Date   FERRITIN 1,263 (H) 07/20/2014   IRON 125 07/20/2014   TIBC 198 (L) 07/20/2014   UIBC 73 (L) 07/20/2014   IRONPCTSAT 63 (H) 07/20/2014   Lab Results  Component Value Date   RETICCTPCT 0.8 07/20/2014   RBC 4.39 06/02/2023   Lab Results  Component Value Date   KPAFRELGTCHN 20.8 (H) 04/21/2023   LAMBDASER 17.7 04/21/2023   KAPLAMBRATIO 1.18 04/21/2023   Lab Results  Component Value Date   IGGSERUM 1,094 04/21/2023   IGA 136 04/21/2023   IGMSERUM 26 04/21/2023   Lab Results  Component Value Date   TOTALPROTELP 6.8 04/21/2023   ALBUMINELP 3.7 04/21/2023   A1GS 0.3 04/21/2023   A2GS 0.8 04/21/2023   BETS 0.9 04/21/2023   BETA2SER 0.3 03/28/2015   GAMS 1.0 04/21/2023   MSPIKE Not Observed 04/21/2023   SPEI Comment 09/27/2022     Chemistry      Component Value Date/Time   NA 139 05/19/2023 0839   NA 140 04/22/2017 1140   NA 138 09/03/2015 1042   K 4.5 05/19/2023 0839   K 4.0 04/22/2017 1140   K 4.3 09/03/2015 1042   CL 106 05/19/2023 0839   CL 105 04/22/2017 1140   CO2 25 05/19/2023 0839   CO2 24 04/22/2017 1140   CO2 25 09/03/2015 1042   BUN 13 05/19/2023 0839   BUN 16 04/22/2017 1140   BUN 21.7 09/03/2015 1042    CREATININE 1.38 (H) 05/19/2023 0839   CREATININE  1.6 (H) 04/22/2017 1140   CREATININE 1.2 09/03/2015 1042      Component Value Date/Time   CALCIUM 8.8 (L) 05/19/2023 0839   CALCIUM 8.5 04/22/2017 1140   CALCIUM 9.1 09/03/2015 1042   ALKPHOS 51 05/19/2023 0839   ALKPHOS 59 04/22/2017 1140   ALKPHOS 42 09/03/2015 1042   AST 20 05/19/2023 0839   AST 27 09/03/2015 1042   ALT 26 05/19/2023 0839   ALT 45 04/22/2017 1140   ALT 41 09/03/2015 1042   BILITOT 0.8 05/19/2023 0839   BILITOT 0.54 09/03/2015 1042       Impression and Plan: Tyler Phillips is a very pleasant 58 yo caucasian gentleman with IgG kappa myeloma. He underwent induction chemotherapy with RVD followed by an autologous stem cell transplant at [facility] on February 2017.   So far, he is doing incredibly well just  on single agent Velcade.  I do not see any evidence of progressive myeloma.  Everything looks fine from my point of view.  We decided to be careful with his infection risk.  I do not see any problems with him going down to [place].  We will plan to get him back in 6 weeks.  We will try to work around his trip to [place].    Josph Macho, MD 2/13/20259:01 AM

## 2023-06-03 LAB — KAPPA/LAMBDA LIGHT CHAINS
Kappa free light chain: 20.5 mg/L — ABNORMAL HIGH (ref 3.3–19.4)
Kappa, lambda light chain ratio: 1.24 (ref 0.26–1.65)
Lambda free light chains: 16.5 mg/L (ref 5.7–26.3)

## 2023-06-04 ENCOUNTER — Other Ambulatory Visit: Payer: Self-pay

## 2023-06-04 LAB — IGG, IGA, IGM
IgA: 150 mg/dL (ref 90–386)
IgG (Immunoglobin G), Serum: 1105 mg/dL (ref 603–1613)
IgM (Immunoglobulin M), Srm: 27 mg/dL (ref 20–172)

## 2023-06-06 ENCOUNTER — Other Ambulatory Visit: Payer: Self-pay

## 2023-06-06 LAB — PROTEIN ELECTROPHORESIS, SERUM, WITH REFLEX
A/G Ratio: 1.6 (ref 0.7–1.7)
Albumin ELP: 4.2 g/dL (ref 2.9–4.4)
Alpha-1-Globulin: 0.1 g/dL (ref 0.0–0.4)
Alpha-2-Globulin: 0.6 g/dL (ref 0.4–1.0)
Beta Globulin: 0.9 g/dL (ref 0.7–1.3)
Gamma Globulin: 1 g/dL (ref 0.4–1.8)
Globulin, Total: 2.6 g/dL (ref 2.2–3.9)
Total Protein ELP: 6.8 g/dL (ref 6.0–8.5)

## 2023-06-07 DIAGNOSIS — R948 Abnormal results of function studies of other organs and systems: Secondary | ICD-10-CM | POA: Diagnosis not present

## 2023-06-10 ENCOUNTER — Encounter: Payer: Self-pay | Admitting: Gastroenterology

## 2023-06-10 ENCOUNTER — Other Ambulatory Visit: Payer: Self-pay

## 2023-06-15 ENCOUNTER — Other Ambulatory Visit (HOSPITAL_COMMUNITY): Payer: Self-pay

## 2023-06-16 ENCOUNTER — Inpatient Hospital Stay: Payer: Medicare Other

## 2023-06-21 ENCOUNTER — Ambulatory Visit: Payer: Medicare Other | Admitting: Gastroenterology

## 2023-06-21 ENCOUNTER — Encounter: Payer: Self-pay | Admitting: Gastroenterology

## 2023-06-21 VITALS — BP 124/83 | HR 63 | Temp 98.3°F | Resp 16 | Ht 72.0 in | Wt 233.0 lb

## 2023-06-21 DIAGNOSIS — D120 Benign neoplasm of cecum: Secondary | ICD-10-CM

## 2023-06-21 DIAGNOSIS — K640 First degree hemorrhoids: Secondary | ICD-10-CM | POA: Diagnosis not present

## 2023-06-21 DIAGNOSIS — D122 Benign neoplasm of ascending colon: Secondary | ICD-10-CM

## 2023-06-21 DIAGNOSIS — Z1211 Encounter for screening for malignant neoplasm of colon: Secondary | ICD-10-CM

## 2023-06-21 DIAGNOSIS — K5730 Diverticulosis of large intestine without perforation or abscess without bleeding: Secondary | ICD-10-CM | POA: Diagnosis not present

## 2023-06-21 DIAGNOSIS — Z860100 Personal history of colon polyps, unspecified: Secondary | ICD-10-CM

## 2023-06-21 DIAGNOSIS — K648 Other hemorrhoids: Secondary | ICD-10-CM

## 2023-06-21 MED ORDER — SODIUM CHLORIDE 0.9 % IV SOLN: 500.0000 mL | Freq: Once | INTRAVENOUS | Status: DC

## 2023-06-21 NOTE — Progress Notes (Unsigned)
 Pt's states no medical or surgical changes since previsit or office visit.

## 2023-06-21 NOTE — Progress Notes (Signed)
 Called to room to assist during endoscopic procedure.  Patient ID and intended procedure confirmed with present staff. Received instructions for my participation in the procedure from the performing physician.

## 2023-06-21 NOTE — Op Note (Signed)
  [facility] Patient Name: Tyler Phillips Procedure Date: 06/21/2023 3:06 PM MRN: [id number] Endoscopist: Doristine Locks , MD, [id number] Age: 58 Referring MD:  Date of Birth: 03-04-1966 Gender: Male Account #: [id number] Procedure:                Colonoscopy Indications:              Surveillance: Personal history of adenomatous                            polyps on last colonoscopy 5 years ago                           Last colonoscopy was 02/07/2018 and notable for a 2                            mm flat transverse colon polyp (tubular adenoma),                            diverticulosis in the sigmoid and ascending colon. Medicines:                Monitored Anesthesia Care Procedure:                Pre-Anesthesia Assessment:                           - Prior to the procedure, a History and Physical                            was performed, and patient medications and                            allergies were reviewed. The patient's tolerance of                            previous anesthesia was also reviewed. The risks                            and benefits of the procedure and the sedation                            options and risks were discussed with the patient.                            All questions were answered, and informed consent                            was obtained. Prior Anticoagulants: The patient has                            taken no anticoagulant or antiplatelet agents. ASA                            Grade Assessment: II - A patient with mild systemic  disease. After reviewing the risks and benefits,                            the patient was deemed in satisfactory condition to                            undergo the procedure.                           After obtaining informed consent, the colonoscope                            was passed under direct vision. Throughout the                            procedure, the patient's  blood pressure, pulse, and                            oxygen saturations were monitored continuously. The                            Olympus Scope SN: [id number] was introduced through                            the anus and advanced to the the cecum, identified                            by appendiceal orifice and ileocecal valve. The                            colonoscopy was performed without difficulty. The                            patient tolerated the procedure well. The quality                            of the bowel preparation was good. The ileocecal                            valve, appendiceal orifice, and rectum were                            photographed. Scope In: 3:21:59 PM Scope Out: 3:41:07 PM Scope Withdrawal Time: 0 hours 17 minutes 17 seconds  Total Procedure Duration: 0 hours 19 minutes 8 seconds  Findings:                 The perianal and digital rectal examinations were                            normal.                           A 12 mm polyp was found in the cecum. The polyp was  sessile. The polyp was removed with a cold snare.                            Resection and retrieval were complete. Estimated                            blood loss was minimal.                           A 4 mm polyp was found in the ascending colon. The                            polyp was sessile. The polyp was removed with a                            cold snare. Resection and retrieval were complete.                            Estimated blood loss was minimal.                           Multiple large-mouthed and small-mouthed                            diverticula were found in the sigmoid colon,                            descending colon and ascending colon.                           Non-bleeding internal hemorrhoids were found during                            retroflexion. The hemorrhoids were small. Complications:            No immediate  complications. Estimated Blood Loss:     Estimated blood loss was minimal. Impression:               - One 12 mm polyp in the cecum, removed with a cold                            snare. Resected and retrieved.                           - One 4 mm polyp in the ascending colon, removed                            with a cold snare. Resected and retrieved.                           - Diverticulosis in the sigmoid colon, in the                            descending colon and in the ascending colon.                           -  Non-bleeding internal hemorrhoids. Recommendation:           - Patient has a contact number available for                            emergencies. The signs and symptoms of potential                            delayed complications were discussed with the                            patient. Return to normal activities tomorrow.                            Written discharge instructions were provided to the                            patient.                           - Resume previous diet.                           - Continue present medications.                           - Await pathology results.                           - Repeat colonoscopy for surveillance based on                            pathology results.                           - Return to GI clinic PRN. Doristine Locks, MD 06/21/2023 3:49:37 PM

## 2023-06-21 NOTE — Progress Notes (Unsigned)
 Sedate, gd SR, tolerated procedure well, VSS, report to RN

## 2023-06-21 NOTE — Progress Notes (Unsigned)
 GASTROENTEROLOGY PROCEDURE H&P NOTE   Primary Care Physician: Joycelyn Rua, MD    Reason for Procedure:  Colon polyp surveillance  Plan:    Colonoscopy  Patient is appropriate for endoscopic procedure(s) in the ambulatory ([facility]) setting.  The nature of the procedure, as well as the risks, benefits, and alternatives were carefully and thoroughly reviewed with the patient. Ample time for discussion and questions allowed. The patient understood, was satisfied, and agreed to proceed.     HPI: Tyler Phillips. is a 58 y.o. male who presents for colonoscopy for ongoing colon polyp surveillance and colon cancer screening.  No active GI symptoms.  No known family history of colon cancer or related malignancy.  Patient is otherwise without complaints or active issues today.  Endoscopic History: - 02/07/2018: Colonoscopy at [facility]: 2 mm flat transverse colon polyp (tubular adenoma), diverticulosis in the sigmoid and ascending colon. Repeat 5 years  Past Medical History:  Diagnosis Date   Adenomatous colon polyp    Anemia    Elevated LFTs    Gallbladder polyp    Multiple myeloma (HCC)    Pneumonia     Past Surgical History:  Procedure Laterality Date   auto stem cell transplant      Prior to Admission medications   Medication Sig Start Date End Date Taking? Authorizing Provider  aspirin 325 MG tablet Take 325 mg by mouth daily.    [provider]  bortezomib IV (VELCADE) 3.5 MG injection 1 mg/m2 every 14 (fourteen) days.    [provider]  cetirizine (ZYRTEC) 10 MG tablet Take 10 mg by mouth daily.    [provider]  Cholecalciferol (VITAMIN D3) 50 MCG (2000 UT) CAPS Take 2,000 Units by mouth.    [provider]  JATENZO 237 MG CAPS Take 1 capsule by mouth 2 (two) times daily. 03/24/23   [provider]  Multiple Vitamin (MULTIVITAMIN WITH MINERALS) TABS tablet Take 1 tablet by mouth daily. 11/17/21   Sheikh, Omair Latif, DO   ondansetron (ZOFRAN) 4 MG tablet Take 1 tablet (4 mg total) by mouth every 6 (six) hours as needed for nausea. 11/16/21   Marguerita Merles Latif, DO  valACYclovir (VALTREX) 500 MG tablet Take 500 mg by mouth 2 (two) times daily.     [provider]  zolpidem (AMBIEN) 10 MG tablet Take 1 tablet (10 mg total) by mouth at bedtime as needed for sleep. 04/21/23 05/21/23  Josph Macho, MD    Current Outpatient Medications  Medication Sig Dispense Refill   aspirin 325 MG tablet Take 325 mg by mouth daily.     bortezomib IV (VELCADE) 3.5 MG injection 1 mg/m2 every 14 (fourteen) days.     cetirizine (ZYRTEC) 10 MG tablet Take 10 mg by mouth daily.     Cholecalciferol (VITAMIN D3) 50 MCG (2000 UT) CAPS Take 2,000 Units by mouth.     JATENZO 237 MG CAPS Take 1 capsule by mouth 2 (two) times daily.     Multiple Vitamin (MULTIVITAMIN WITH MINERALS) TABS tablet Take 1 tablet by mouth daily. 30 tablet 0   ondansetron (ZOFRAN) 4 MG tablet Take 1 tablet (4 mg total) by mouth every 6 (six) hours as needed for nausea. 20 tablet 0   valACYclovir (VALTREX) 500 MG tablet Take 500 mg by mouth 2 (two) times daily.      zolpidem (AMBIEN) 10 MG tablet Take 1 tablet (10 mg total) by mouth at bedtime as needed for sleep. 30  tablet 0   Current Facility-Administered Medications  Medication Dose Route Frequency Provider Last Rate Last Admin   0.9 %  sodium chloride infusion  500 mL Intravenous Once Yarel Kilcrease V, DO        Allergies as of 06/21/2023   (No Known Allergies)    Family History  Problem Relation Age of Onset   Prostate cancer Mother    COPD Father    Heart disease Brother    Leukemia Maternal Grandmother    Stomach cancer Neg Hx    Colon cancer Neg Hx    Esophageal cancer Neg Hx     Social History   Socioeconomic History   Marital status: Married    Spouse name: Not on file   Number of children: 3   Years of education: Not on file   Highest education level: Not on file   Occupational History   Occupation: retired Clinical research associate  Tobacco Use   Smoking status: Never   Smokeless tobacco: Never   Tobacco comments:    NEVER USED TOBACCO  Vaping Use   Vaping status: Never Used  Substance and Sexual Activity   Alcohol use: Yes    Alcohol/week: 7.0 standard drinks of alcohol    Types: 7 Glasses of wine per week    Comment: beer wine and liqour 2 drinks a night   Drug use: No   Sexual activity: Yes  Other Topics Concern   Not on file  Social History Narrative   Not on file   Social Drivers of Health   Financial Resource Strain: Not on file  Food Insecurity: No Food Insecurity (03/29/2022)   Hunger Vital Sign    Worried About Running Out of Food in the Last Year: Never true    Ran Out of Food in the Last Year: Never true  Transportation Needs: No Transportation Needs (03/29/2022)   PRAPARE - Administrator, Civil Service (Medical): No    Lack of Transportation (Non-Medical): No  Physical Activity: Not on file  Stress: Not on file  Social Connections: Unknown (11/10/2021)   Received from [facility]   Social Network    Social Network: Not on file  Intimate Partner Violence: Unknown (11/10/2021)   Received from [facility]   HITS    Physically Hurt: Not on file    Insult or Talk Down To: Not on file    Threaten Physical Harm: Not on file    Scream or Curse: Not on file    Physical Exam: Vital signs in last 24 hours: @BP  127/87   Pulse 63   Temp 98.3 F (36.8 C)   Ht 6' (1.829 m)   Wt 233 lb (105.7 kg)   SpO2 99%   BMI 31.60 kg/m  GEN: NAD EYE: Sclerae anicteric ENT: MMM CV: Non-tachycardic Pulm: CTA b/l GI: Soft, NT/ND NEURO:  Alert & Oriented x 3   Doristine Locks, DO [facility]   06/21/2023 3:15 PM

## 2023-06-21 NOTE — Patient Instructions (Addendum)
-   Resume previous diet - Continue present medications. - Await pathology results  YOU HAD AN ENDOSCOPIC PROCEDURE TODAY AT THE [facility]:   Refer to the procedure report that was given to you for any specific questions about what was found during the examination.  If the procedure report does not answer your questions, please call your gastroenterologist to clarify.  If you requested that your care partner not be given the details of your procedure findings, then the procedure report has been included in a sealed envelope for you to review at your convenience later.  YOU SHOULD EXPECT: Some feelings of bloating in the abdomen. Passage of more gas than usual.  Walking can help get rid of the air that was put into your GI tract during the procedure and reduce the bloating. If you had a lower endoscopy (such as a colonoscopy or flexible sigmoidoscopy) you may notice spotting of blood in your stool or on the toilet paper. If you underwent a bowel prep for your procedure, you may not have a normal bowel movement for a few days.  Please Note:  You might notice some irritation and congestion in your nose or some drainage.  This is from the oxygen used during your procedure.  There is no need for concern and it should clear up in a day or so.  SYMPTOMS TO REPORT IMMEDIATELY:  Following lower endoscopy (colonoscopy or flexible sigmoidoscopy):  Excessive amounts of blood in the stool  Significant tenderness or worsening of abdominal pains  Swelling of the abdomen that is new, acute  Fever of 100F or higher  For urgent or emergent issues, a gastroenterologist can be reached at any hour by calling [phone]. Do not use MyChart messaging for urgent concerns.    DIET:  We do recommend a small meal at first, but then you may proceed to your regular diet.  Drink plenty of fluids but you should avoid alcoholic beverages for 24 hours.  ACTIVITY:  You should plan to take it easy for the  rest of today and you should NOT DRIVE or use heavy machinery until tomorrow (because of the sedation medicines used during the test).    FOLLOW UP: Our staff will call the number listed on your records the next business day following your procedure.  We will call around 7:15- 8:00 am to check on you and address any questions or concerns that you may have regarding the information given to you following your procedure. If we do not reach you, we will leave a message.     If any biopsies were taken you will be contacted by phone or by letter within the next 1-3 weeks.  Please call us at [phone] if you have not heard about the biopsies in 3 weeks.    SIGNATURES/CONFIDENTIALITY: You and/or your care partner have signed paperwork which will be entered into your electronic medical record.  These signatures attest to the fact that that the information above on your After Visit Summary has been reviewed and is understood.  Full responsibility of the confidentiality of this discharge information lies with you and/or your care-partner.

## 2023-06-22 ENCOUNTER — Inpatient Hospital Stay: Payer: Medicare Other | Attending: Hematology & Oncology

## 2023-06-22 ENCOUNTER — Encounter: Payer: Self-pay | Admitting: Hematology & Oncology

## 2023-06-22 ENCOUNTER — Telehealth: Payer: Self-pay

## 2023-06-22 ENCOUNTER — Inpatient Hospital Stay: Payer: Medicare Other

## 2023-06-22 VITALS — BP 125/86 | HR 70 | Temp 98.1°F | Resp 17

## 2023-06-22 DIAGNOSIS — C9000 Multiple myeloma not having achieved remission: Secondary | ICD-10-CM | POA: Diagnosis not present

## 2023-06-22 DIAGNOSIS — Z5112 Encounter for antineoplastic immunotherapy: Secondary | ICD-10-CM | POA: Diagnosis not present

## 2023-06-22 DIAGNOSIS — C9001 Multiple myeloma in remission: Secondary | ICD-10-CM

## 2023-06-22 LAB — CBC WITH DIFFERENTIAL (CANCER CENTER ONLY)
Abs Immature Granulocytes: 0.02 10*3/uL (ref 0.00–0.07)
Basophils Absolute: 0 10*3/uL (ref 0.0–0.1)
Basophils Relative: 0 %
Eosinophils Absolute: 0.1 10*3/uL (ref 0.0–0.5)
Eosinophils Relative: 1 %
HCT: 43.3 % (ref 39.0–52.0)
Hemoglobin: 15.3 g/dL (ref 13.0–17.0)
Immature Granulocytes: 0 %
Lymphocytes Relative: 19 %
Lymphs Abs: 1.3 10*3/uL (ref 0.7–4.0)
MCH: 34.1 pg — ABNORMAL HIGH (ref 26.0–34.0)
MCHC: 35.3 g/dL (ref 30.0–36.0)
MCV: 96.4 fL (ref 80.0–100.0)
Monocytes Absolute: 0.4 10*3/uL (ref 0.1–1.0)
Monocytes Relative: 6 %
Neutro Abs: 5 10*3/uL (ref 1.7–7.7)
Neutrophils Relative %: 74 %
Platelet Count: 181 10*3/uL (ref 150–400)
RBC: 4.49 MIL/uL (ref 4.22–5.81)
RDW: 12.4 % (ref 11.5–15.5)
WBC Count: 6.7 10*3/uL (ref 4.0–10.5)
nRBC: 0 % (ref 0.0–0.2)

## 2023-06-22 LAB — CMP (CANCER CENTER ONLY)
ALT: 33 U/L (ref 0–44)
AST: 20 U/L (ref 15–41)
Albumin: 4.6 g/dL (ref 3.5–5.0)
Alkaline Phosphatase: 63 U/L (ref 38–126)
Anion gap: 8 (ref 5–15)
BUN: 18 mg/dL (ref 6–20)
CO2: 23 mmol/L (ref 22–32)
Calcium: 9.2 mg/dL (ref 8.9–10.3)
Chloride: 108 mmol/L (ref 98–111)
Creatinine: 1.39 mg/dL — ABNORMAL HIGH (ref 0.61–1.24)
GFR, Estimated: 59 mL/min — ABNORMAL LOW (ref >60)
Glucose, Bld: 63 mg/dL — ABNORMAL LOW (ref 70–99)
Potassium: 4.2 mmol/L (ref 3.5–5.1)
Sodium: 139 mmol/L (ref 135–145)
Total Bilirubin: 0.8 mg/dL (ref 0.0–1.2)
Total Protein: 7.2 g/dL (ref 6.5–8.1)

## 2023-06-22 MED ORDER — BORTEZOMIB CHEMO SQ INJECTION 3.5 MG (2.5MG/ML)
1.3000 mg/m2 | Freq: Once | INTRAMUSCULAR | Status: AC
  Administered 2023-06-22: 2.75 mg via SUBCUTANEOUS
  Filled 2023-06-22: qty 1.1

## 2023-06-22 MED ORDER — PROCHLORPERAZINE MALEATE 10 MG PO TABS: 10.0000 mg | ORAL_TABLET | Freq: Once | ORAL | Status: DC

## 2023-06-22 NOTE — Patient Instructions (Signed)
 [facility]  Discharge Instructions: Thank you for choosing [facility] to provide your oncology and hematology care.   If you have a lab appointment with the Cancer Center, please go directly to the Cancer Center and check in at the registration area.  Wear comfortable clothing and clothing appropriate for easy access to any Portacath or PICC line.   We strive to give you quality time with your provider. You may need to reschedule your appointment if you arrive late (15 or more minutes).  Arriving late affects you and other patients whose appointments are after yours.  Also, if you miss three or more appointments without notifying the office, you may be dismissed from the clinic at the provider's discretion.      For prescription refill requests, have your pharmacy contact our office and allow 72 hours for refills to be completed.    Today you received the following chemotherapy and/or immunotherapy agents Velcade      To help prevent nausea and vomiting after your treatment, we encourage you to take your nausea medication as directed.  BELOW ARE SYMPTOMS THAT SHOULD BE REPORTED IMMEDIATELY: *FEVER GREATER THAN 100.4 F (38 C) OR HIGHER *CHILLS OR SWEATING *NAUSEA AND VOMITING THAT IS NOT CONTROLLED WITH YOUR NAUSEA MEDICATION *UNUSUAL SHORTNESS OF BREATH *UNUSUAL BRUISING OR BLEEDING *URINARY PROBLEMS (pain or burning when urinating, or frequent urination) *BOWEL PROBLEMS (unusual diarrhea, constipation, pain near the anus) TENDERNESS IN MOUTH AND THROAT WITH OR WITHOUT PRESENCE OF ULCERS (sore throat, sores in mouth, or a toothache) UNUSUAL RASH, SWELLING OR PAIN  UNUSUAL VAGINAL DISCHARGE OR ITCHING   Items with * indicate a potential emergency and should be followed up as soon as possible or go to the Emergency Department if any problems should occur.  Please show the CHEMOTHERAPY ALERT CARD or IMMUNOTHERAPY  ALERT CARD at check-in to the Emergency Department and triage nurse. Should you have questions after your visit or need to cancel or reschedule your appointment, please contact [facility] [facility] - A DEPT OF [facility]  [phone] and follow the prompts.  Office hours are 8:00 a.m. to 4:30 p.m. Monday - Friday. Please note that voicemails left after 4:00 p.m. may not be returned until the following business day.  We are closed weekends and major holidays. You have access to a nurse at all times for urgent questions. Please call the main number to the clinic [phone] and follow the prompts.  For any non-urgent questions, you may also contact your provider using MyChart. We now offer e-Visits for anyone 69 and older to request care online for non-urgent symptoms. For details visit [email or web address].   Also download the MyChart app! Go to the app store, search "MyChart", open the app, select [place], and log in with your MyChart username and password.

## 2023-06-22 NOTE — Telephone Encounter (Signed)
Unable to leave message no answering machine.

## 2023-06-24 ENCOUNTER — Encounter: Payer: Self-pay | Admitting: Gastroenterology

## 2023-06-24 LAB — SURGICAL PATHOLOGY

## 2023-06-30 ENCOUNTER — Inpatient Hospital Stay: Payer: Medicare Other

## 2023-07-06 ENCOUNTER — Inpatient Hospital Stay: Payer: Medicare Other

## 2023-07-06 ENCOUNTER — Encounter: Payer: Self-pay | Admitting: Hematology & Oncology

## 2023-07-06 VITALS — BP 128/83 | HR 56 | Temp 97.9°F | Resp 16

## 2023-07-06 DIAGNOSIS — C9001 Multiple myeloma in remission: Secondary | ICD-10-CM

## 2023-07-06 DIAGNOSIS — Z5112 Encounter for antineoplastic immunotherapy: Secondary | ICD-10-CM | POA: Diagnosis not present

## 2023-07-06 DIAGNOSIS — C9000 Multiple myeloma not having achieved remission: Secondary | ICD-10-CM | POA: Diagnosis not present

## 2023-07-06 LAB — CMP (CANCER CENTER ONLY)
ALT: 32 U/L (ref 0–44)
AST: 23 U/L (ref 15–41)
Albumin: 4.6 g/dL (ref 3.5–5.0)
Alkaline Phosphatase: 57 U/L (ref 38–126)
Anion gap: 7 (ref 5–15)
BUN: 20 mg/dL (ref 6–20)
CO2: 26 mmol/L (ref 22–32)
Calcium: 8.8 mg/dL — ABNORMAL LOW (ref 8.9–10.3)
Chloride: 106 mmol/L (ref 98–111)
Creatinine: 1.36 mg/dL — ABNORMAL HIGH (ref 0.61–1.24)
GFR, Estimated: >60 mL/min (ref >60)
Glucose, Bld: 87 mg/dL (ref 70–99)
Potassium: 4.4 mmol/L (ref 3.5–5.1)
Sodium: 139 mmol/L (ref 135–145)
Total Bilirubin: 0.9 mg/dL (ref 0.0–1.2)
Total Protein: 7 g/dL (ref 6.5–8.1)

## 2023-07-06 LAB — CBC WITH DIFFERENTIAL (CANCER CENTER ONLY)
Abs Immature Granulocytes: 0.02 10*3/uL (ref 0.00–0.07)
Basophils Absolute: 0 10*3/uL (ref 0.0–0.1)
Basophils Relative: 0 %
Eosinophils Absolute: 0.1 10*3/uL (ref 0.0–0.5)
Eosinophils Relative: 2 %
HCT: 41.9 % (ref 39.0–52.0)
Hemoglobin: 14.8 g/dL (ref 13.0–17.0)
Immature Granulocytes: 0 %
Lymphocytes Relative: 27 %
Lymphs Abs: 1.5 10*3/uL (ref 0.7–4.0)
MCH: 34 pg (ref 26.0–34.0)
MCHC: 35.3 g/dL (ref 30.0–36.0)
MCV: 96.3 fL (ref 80.0–100.0)
Monocytes Absolute: 0.4 10*3/uL (ref 0.1–1.0)
Monocytes Relative: 7 %
Neutro Abs: 3.3 10*3/uL (ref 1.7–7.7)
Neutrophils Relative %: 64 %
Platelet Count: 148 10*3/uL — ABNORMAL LOW (ref 150–400)
RBC: 4.35 MIL/uL (ref 4.22–5.81)
RDW: 12.5 % (ref 11.5–15.5)
WBC Count: 5.3 10*3/uL (ref 4.0–10.5)
nRBC: 0 % (ref 0.0–0.2)

## 2023-07-06 MED ORDER — BORTEZOMIB CHEMO SQ INJECTION 3.5 MG (2.5MG/ML)
1.3000 mg/m2 | Freq: Once | INTRAMUSCULAR | Status: AC
  Administered 2023-07-06: 2.75 mg via SUBCUTANEOUS
  Filled 2023-07-06: qty 1.1

## 2023-07-06 NOTE — Patient Instructions (Signed)
 [facility]  Discharge Instructions: Thank you for choosing [facility] to provide your oncology and hematology care.   If you have a lab appointment with the Cancer Center, please go directly to the Cancer Center and check in at the registration area.  Wear comfortable clothing and clothing appropriate for easy access to any Portacath or PICC line.   We strive to give you quality time with your provider. You may need to reschedule your appointment if you arrive late (15 or more minutes).  Arriving late affects you and other patients whose appointments are after yours.  Also, if you miss three or more appointments without notifying the office, you may be dismissed from the clinic at the provider's discretion.      For prescription refill requests, have your pharmacy contact our office and allow 72 hours for refills to be completed.    Today you received the following chemotherapy and/or immunotherapy agents Velcade      To help prevent nausea and vomiting after your treatment, we encourage you to take your nausea medication as directed.  BELOW ARE SYMPTOMS THAT SHOULD BE REPORTED IMMEDIATELY: *FEVER GREATER THAN 100.4 F (38 C) OR HIGHER *CHILLS OR SWEATING *NAUSEA AND VOMITING THAT IS NOT CONTROLLED WITH YOUR NAUSEA MEDICATION *UNUSUAL SHORTNESS OF BREATH *UNUSUAL BRUISING OR BLEEDING *URINARY PROBLEMS (pain or burning when urinating, or frequent urination) *BOWEL PROBLEMS (unusual diarrhea, constipation, pain near the anus) TENDERNESS IN MOUTH AND THROAT WITH OR WITHOUT PRESENCE OF ULCERS (sore throat, sores in mouth, or a toothache) UNUSUAL RASH, SWELLING OR PAIN  UNUSUAL VAGINAL DISCHARGE OR ITCHING   Items with * indicate a potential emergency and should be followed up as soon as possible or go to the Emergency Department if any problems should occur.  Please show the CHEMOTHERAPY ALERT CARD or IMMUNOTHERAPY  ALERT CARD at check-in to the Emergency Department and triage nurse. Should you have questions after your visit or need to cancel or reschedule your appointment, please contact [facility] [facility] - A DEPT OF [facility]  [phone] and follow the prompts.  Office hours are 8:00 a.m. to 4:30 p.m. Monday - Friday. Please note that voicemails left after 4:00 p.m. may not be returned until the following business day.  We are closed weekends and major holidays. You have access to a nurse at all times for urgent questions. Please call the main number to the clinic [phone] and follow the prompts.  For any non-urgent questions, you may also contact your provider using MyChart. We now offer e-Visits for anyone 69 and older to request care online for non-urgent symptoms. For details visit [email or web address].   Also download the MyChart app! Go to the app store, search "MyChart", open the app, select [place], and log in with your MyChart username and password.

## 2023-07-14 ENCOUNTER — Inpatient Hospital Stay: Payer: Medicare Other

## 2023-07-14 ENCOUNTER — Inpatient Hospital Stay: Payer: Medicare Other | Admitting: Hematology & Oncology

## 2023-07-22 ENCOUNTER — Inpatient Hospital Stay: Payer: Medicare Other | Admitting: Hematology & Oncology

## 2023-07-22 ENCOUNTER — Inpatient Hospital Stay: Payer: Medicare Other | Attending: Hematology & Oncology

## 2023-07-22 ENCOUNTER — Encounter: Payer: Self-pay | Admitting: Hematology & Oncology

## 2023-07-22 ENCOUNTER — Inpatient Hospital Stay: Payer: Medicare Other

## 2023-07-22 VITALS — BP 126/95 | HR 54 | Temp 97.7°F | Resp 20 | Ht 78.0 in | Wt 220.1 lb

## 2023-07-22 DIAGNOSIS — C9001 Multiple myeloma in remission: Secondary | ICD-10-CM

## 2023-07-22 DIAGNOSIS — Z5112 Encounter for antineoplastic immunotherapy: Secondary | ICD-10-CM | POA: Diagnosis not present

## 2023-07-22 DIAGNOSIS — C9000 Multiple myeloma not having achieved remission: Secondary | ICD-10-CM | POA: Insufficient documentation

## 2023-07-22 LAB — CBC WITH DIFFERENTIAL (CANCER CENTER ONLY)
Abs Immature Granulocytes: 0.01 10*3/uL (ref 0.00–0.07)
Basophils Absolute: 0 10*3/uL (ref 0.0–0.1)
Basophils Relative: 0 %
Eosinophils Absolute: 0.1 10*3/uL (ref 0.0–0.5)
Eosinophils Relative: 2 %
HCT: 42 % (ref 39.0–52.0)
Hemoglobin: 14.8 g/dL (ref 13.0–17.0)
Immature Granulocytes: 0 %
Lymphocytes Relative: 28 %
Lymphs Abs: 1.3 10*3/uL (ref 0.7–4.0)
MCH: 33.9 pg (ref 26.0–34.0)
MCHC: 35.2 g/dL (ref 30.0–36.0)
MCV: 96.3 fL (ref 80.0–100.0)
Monocytes Absolute: 0.4 10*3/uL (ref 0.1–1.0)
Monocytes Relative: 8 %
Neutro Abs: 2.9 10*3/uL (ref 1.7–7.7)
Neutrophils Relative %: 62 %
Platelet Count: 153 10*3/uL (ref 150–400)
RBC: 4.36 MIL/uL (ref 4.22–5.81)
RDW: 12.7 % (ref 11.5–15.5)
WBC Count: 4.7 10*3/uL (ref 4.0–10.5)
nRBC: 0 % (ref 0.0–0.2)

## 2023-07-22 LAB — CMP (CANCER CENTER ONLY)
ALT: 36 U/L (ref 0–44)
AST: 24 U/L (ref 15–41)
Albumin: 4.6 g/dL (ref 3.5–5.0)
Alkaline Phosphatase: 56 U/L (ref 38–126)
Anion gap: 7 (ref 5–15)
BUN: 15 mg/dL (ref 6–20)
CO2: 27 mmol/L (ref 22–32)
Calcium: 8.8 mg/dL — ABNORMAL LOW (ref 8.9–10.3)
Chloride: 104 mmol/L (ref 98–111)
Creatinine: 1.33 mg/dL — ABNORMAL HIGH (ref 0.61–1.24)
GFR, Estimated: >60 mL/min (ref >60)
Glucose, Bld: 95 mg/dL (ref 70–99)
Potassium: 4.4 mmol/L (ref 3.5–5.1)
Sodium: 138 mmol/L (ref 135–145)
Total Bilirubin: 0.8 mg/dL (ref 0.0–1.2)
Total Protein: 7.1 g/dL (ref 6.5–8.1)

## 2023-07-22 LAB — LACTATE DEHYDROGENASE: LDH: 150 U/L (ref 98–192)

## 2023-07-22 MED ORDER — BORTEZOMIB CHEMO SQ INJECTION 3.5 MG (2.5MG/ML)
1.3000 mg/m2 | Freq: Once | INTRAMUSCULAR | Status: AC
  Administered 2023-07-22: 2.75 mg via SUBCUTANEOUS
  Filled 2023-07-22: qty 1.1

## 2023-07-22 NOTE — Patient Instructions (Addendum)
 [facility] - A DEPT OF [facility]  Discharge Instructions: Thank you for choosing [facility] to provide your oncology and hematology care.   If you have a lab appointment with the Cancer Center, please go directly to the Cancer Center and check in at the registration area.  Wear comfortable clothing and clothing appropriate for easy access to any Portacath or PICC line.   We strive to give you quality time with your provider. You may need to reschedule your appointment if you arrive late (15 or more minutes).  Arriving late affects you and other patients whose appointments are after yours.  Also, if you miss three or more appointments without notifying the office, you may be dismissed from the clinic at the provider's discretion.      For prescription refill requests, have your pharmacy contact our office and allow 72 hours for refills to be completed.    Today you received the following chemotherapy and/or immunotherapy agents Velcade      To help prevent nausea and vomiting after your treatment, we encourage you to take your nausea medication as directed.  BELOW ARE SYMPTOMS THAT SHOULD BE REPORTED IMMEDIATELY: *FEVER GREATER THAN 100.4 F (38 C) OR HIGHER *CHILLS OR SWEATING *NAUSEA AND VOMITING THAT IS NOT CONTROLLED WITH YOUR NAUSEA MEDICATION *UNUSUAL SHORTNESS OF BREATH *UNUSUAL BRUISING OR BLEEDING *URINARY PROBLEMS (pain or burning when urinating, or frequent urination) *BOWEL PROBLEMS (unusual diarrhea, constipation, pain near the anus) TENDERNESS IN MOUTH AND THROAT WITH OR WITHOUT PRESENCE OF ULCERS (sore throat, sores in mouth, or a toothache) UNUSUAL RASH, SWELLING OR PAIN  UNUSUAL VAGINAL DISCHARGE OR ITCHING   Items with * indicate a potential emergency and should be followed up as soon as possible or go to the Emergency Department if any problems should occur.  Please show the CHEMOTHERAPY ALERT CARD or IMMUNOTHERAPY  ALERT CARD at check-in to the Emergency Department and triage nurse. Should you have questions after your visit or need to cancel or reschedule your appointment, please contact [facility] - A DEPT OF [facility]  [phone] and follow the prompts.  Office hours are 8:00 a.m. to 4:30 p.m. Monday - Friday. Please note that voicemails left after 4:00 p.m. may not be returned until the following business day.  We are closed weekends and major holidays. You have access to a nurse at all times for urgent questions. Please call the main number to the clinic [phone] and follow the prompts.  For any non-urgent questions, you may also contact your provider using MyChart. We now offer e-Visits for anyone 69 and older to request care online for non-urgent symptoms. For details visit [email or web address].   Also download the MyChart app! Go to the app store, search "MyChart", open the app, select [place], and log in with your MyChart username and password.

## 2023-07-22 NOTE — Progress Notes (Signed)
 Premeds taken PTA. Ok to treat with DBP >90 per Dr Myna Hidalgo. dph

## 2023-07-22 NOTE — Progress Notes (Signed)
 BP remains elevated, instructed to monitor at home and notify PCP if it remains over 140/90, verbalized understanding.

## 2023-07-22 NOTE — Progress Notes (Signed)
 Hematology and Oncology Follow Up Visit  Tyler Phillips [id number] Sep 01, 1965 58 y.o. 07/22/2023   Principle Diagnosis:  IgG Kappa myeloma - +4, +14, +17   Past Therapy: Status post autologous stem cell transplant on 05/22/2015   S/p cycle 6 of RVD Ninlaro 4 mg po q 2 week -- start on 01/02/2019 -- d/c on 02/10/2019   Current Therapy:        Velcade q 2wk dosing Revlimid 10mg  po q day (21/7)  - d/c on 12/07/2021   Interim History:  Tyler Phillips is here today for follow-up and treatment.  As always, he is been traveling.  He got back from [place].  He charted a boat and went down to the [place].  He and his wife had a wonderful time.  As expected, he is incredibly tanned.  He feels well.  He is under little bit of stress.  There is an issue that he is trying to get through with respect to the family.  He has had no problems with nausea or vomiting.  He has had no fever.  He has had no change in bowel or bladder habits.  He has had no swelling.  He has had no bleeding.  His last myeloma studies did not show any monoclonal spike in his blood.  His IgG level was 1100 mg/dL.  The kappa light chain was 2.1 mg/dL.  Overall, I would say that his performance status is ECOG 0.    Medications:  Allergies as of 07/22/2023   No Known Allergies      Medication List        Accurate as of July 22, 2023  9:30 AM. If you have any questions, ask your nurse or doctor.          STOP taking these medications    vitamin D3 50 MCG (2000 UT) Caps Stopped by: Josph Macho       TAKE these medications    aspirin EC 81 MG tablet Take 81 mg by mouth daily. Swallow whole. What changed: Another medication with the same name was removed. Continue taking this medication, and follow the directions you see here. Changed by: Josph Macho   bortezomib IV 3.5 MG injection Commonly known as: VELCADE 1 mg/m2 every 14 (fourteen) days.   cetirizine 10 MG tablet Commonly known as:  ZYRTEC Take 10 mg by mouth daily.   Jatenzo 237 MG Caps Generic drug: Testosterone Undecanoate Take 1 capsule by mouth 2 (two) times daily.   multivitamin with minerals Tabs tablet Take 1 tablet by mouth daily.   ondansetron 4 MG tablet Commonly known as: ZOFRAN Take 1 tablet (4 mg total) by mouth every 6 (six) hours as needed for nausea.   valACYclovir 500 MG tablet Commonly known as: VALTREX Take 500 mg by mouth 2 (two) times daily.   zolpidem 10 MG tablet Commonly known as: AMBIEN Take 1 tablet (10 mg total) by mouth at bedtime as needed for sleep.        Allergies: No Known Allergies  Past Medical History, Surgical history, Social history, and Family History were reviewed and updated.  Review of Systems: Review of Systems  Constitutional: Negative.   HENT: Negative.    Eyes: Negative.   Respiratory: Negative.    Cardiovascular: Negative.   Gastrointestinal: Negative.   Genitourinary: Negative.   Musculoskeletal: Negative.   Skin: Negative.   Neurological: Negative.   Endo/Heme/Allergies: Negative.   Psychiatric/Behavioral: Negative.     Marland Kitchen  Physical Exam:  height is 6\' 6"  (1.981 m) and weight is 220 lb 1.3 oz (99.8 kg). His oral temperature is 97.7 F (36.5 C). His blood pressure is 126/95 (abnormal) and his pulse is 54 (abnormal). His respiration is 20 and oxygen saturation is 100%.   Wt Readings from Last 3 Encounters:  07/22/23 220 lb 1.3 oz (99.8 kg)  06/21/23 233 lb (105.7 kg)  06/02/23 223 lb (101.2 kg)    Physical Exam Vitals reviewed.  HENT:     Head: Normocephalic and atraumatic.  Eyes:     Pupils: Pupils are equal, round, and reactive to light.  Cardiovascular:     Rate and Rhythm: Normal rate and regular rhythm.     Heart sounds: Normal heart sounds.  Pulmonary:     Effort: Pulmonary effort is normal.     Breath sounds: Normal breath sounds.  Abdominal:     General: Bowel sounds are normal.     Palpations: Abdomen is soft.   Musculoskeletal:        General: No tenderness or deformity. Normal range of motion.     Cervical back: Normal range of motion.     Comments: Extremities shows a walking boot on the left foot.  Otherwise, extremities do not show any edema.  He has good range of motion of his joints that he can move.  There is no swelling noted.  There is no erythema.  Lymphadenopathy:     Cervical: No cervical adenopathy.  Skin:    General: Skin is warm and dry.     Findings: No erythema or rash.  Neurological:     Mental Status: He is alert and oriented to person, place, and time.  Psychiatric:        Behavior: Behavior normal.        Thought Content: Thought content normal.        Judgment: Judgment normal.      Lab Results  Component Value Date   WBC 4.7 07/22/2023   HGB 14.8 07/22/2023   HCT 42.0 07/22/2023   MCV 96.3 07/22/2023   PLT 153 07/22/2023   Lab Results  Component Value Date   FERRITIN 1,263 (H) 07/20/2014   IRON 125 07/20/2014   TIBC 198 (L) 07/20/2014   UIBC 73 (L) 07/20/2014   IRONPCTSAT 63 (H) 07/20/2014   Lab Results  Component Value Date   RETICCTPCT 0.8 07/20/2014   RBC 4.36 07/22/2023   Lab Results  Component Value Date   KPAFRELGTCHN 20.5 (H) 06/02/2023   LAMBDASER 16.5 06/02/2023   KAPLAMBRATIO 1.24 06/02/2023   Lab Results  Component Value Date   IGGSERUM 1,105 06/02/2023   IGA 150 06/02/2023   IGMSERUM 27 06/02/2023   Lab Results  Component Value Date   TOTALPROTELP 6.8 06/02/2023   ALBUMINELP 4.2 06/02/2023   A1GS 0.1 06/02/2023   A2GS 0.6 06/02/2023   BETS 0.9 06/02/2023   BETA2SER 0.3 03/28/2015   GAMS 1.0 06/02/2023   MSPIKE Not Observed 06/02/2023   SPEI Comment 09/27/2022     Chemistry      Component Value Date/Time   NA 138 07/22/2023 0846   NA 140 04/22/2017 1140   NA 138 09/03/2015 1042   K 4.4 07/22/2023 0846   K 4.0 04/22/2017 1140   K 4.3 09/03/2015 1042   CL 104 07/22/2023 0846   CL 105 04/22/2017 1140   CO2 27  07/22/2023 0846   CO2 24 04/22/2017 1140   CO2 25 09/03/2015 1042  BUN 15 07/22/2023 0846   BUN 16 04/22/2017 1140   BUN 21.7 09/03/2015 1042   CREATININE 1.33 (H) 07/22/2023 0846   CREATININE 1.6 (H) 04/22/2017 1140   CREATININE 1.2 09/03/2015 1042      Component Value Date/Time   CALCIUM 8.8 (L) 07/22/2023 0846   CALCIUM 8.5 04/22/2017 1140   CALCIUM 9.1 09/03/2015 1042   ALKPHOS 56 07/22/2023 0846   ALKPHOS 59 04/22/2017 1140   ALKPHOS 42 09/03/2015 1042   AST 24 07/22/2023 0846   AST 27 09/03/2015 1042   ALT 36 07/22/2023 0846   ALT 45 04/22/2017 1140   ALT 41 09/03/2015 1042   BILITOT 0.8 07/22/2023 0846   BILITOT 0.54 09/03/2015 1042       Impression and Plan: Tyler Phillips is a very pleasant 58 yo caucasian gentleman with IgG kappa myeloma. He underwent induction chemotherapy with RVD followed by an autologous stem cell transplant at [facility] on February 2017.   So far, he is doing incredibly well just  on single agent Velcade.  I do not see any evidence of progressive myeloma.  I will see about changing him to every 3-week intervals.  I think that would be safe.  As always, he is incredibly busy.  I know that he will have a wonderful weekend.  Will go ahead and plan to get him back to see [place] in another 6 weeks.     Josph Macho, MD 4/4/20259:30 AM

## 2023-07-23 ENCOUNTER — Other Ambulatory Visit: Payer: Self-pay

## 2023-07-25 LAB — PROTEIN ELECTROPHORESIS, SERUM, WITH REFLEX
A/G Ratio: 1.4 (ref 0.7–1.7)
Albumin ELP: 3.9 g/dL (ref 2.9–4.4)
Alpha-1-Globulin: 0.2 g/dL (ref 0.0–0.4)
Alpha-2-Globulin: 0.7 g/dL (ref 0.4–1.0)
Beta Globulin: 0.9 g/dL (ref 0.7–1.3)
Gamma Globulin: 1.1 g/dL (ref 0.4–1.8)
Globulin, Total: 2.8 g/dL (ref 2.2–3.9)
Total Protein ELP: 6.7 g/dL (ref 6.0–8.5)

## 2023-07-25 LAB — KAPPA/LAMBDA LIGHT CHAINS
Kappa free light chain: 18.3 mg/L (ref 3.3–19.4)
Kappa, lambda light chain ratio: 1.06 (ref 0.26–1.65)
Lambda free light chains: 17.2 mg/L (ref 5.7–26.3)

## 2023-08-12 ENCOUNTER — Inpatient Hospital Stay

## 2023-08-12 ENCOUNTER — Encounter: Payer: Self-pay | Admitting: Hematology & Oncology

## 2023-08-12 VITALS — BP 145/89 | HR 54 | Temp 98.2°F | Resp 19

## 2023-08-12 DIAGNOSIS — C9001 Multiple myeloma in remission: Secondary | ICD-10-CM

## 2023-08-12 DIAGNOSIS — C9000 Multiple myeloma not having achieved remission: Secondary | ICD-10-CM | POA: Diagnosis not present

## 2023-08-12 DIAGNOSIS — Z5112 Encounter for antineoplastic immunotherapy: Secondary | ICD-10-CM | POA: Diagnosis not present

## 2023-08-12 LAB — CBC WITH DIFFERENTIAL (CANCER CENTER ONLY)
Abs Immature Granulocytes: 0.01 10*3/uL (ref 0.00–0.07)
Basophils Absolute: 0 10*3/uL (ref 0.0–0.1)
Basophils Relative: 1 %
Eosinophils Absolute: 0.2 10*3/uL (ref 0.0–0.5)
Eosinophils Relative: 3 %
HCT: 42.9 % (ref 39.0–52.0)
Hemoglobin: 15.3 g/dL (ref 13.0–17.0)
Immature Granulocytes: 0 %
Lymphocytes Relative: 27 %
Lymphs Abs: 1.3 10*3/uL (ref 0.7–4.0)
MCH: 33.9 pg (ref 26.0–34.0)
MCHC: 35.7 g/dL (ref 30.0–36.0)
MCV: 95.1 fL (ref 80.0–100.0)
Monocytes Absolute: 0.4 10*3/uL (ref 0.1–1.0)
Monocytes Relative: 8 %
Neutro Abs: 3 10*3/uL (ref 1.7–7.7)
Neutrophils Relative %: 61 %
Platelet Count: 155 10*3/uL (ref 150–400)
RBC: 4.51 MIL/uL (ref 4.22–5.81)
RDW: 12.7 % (ref 11.5–15.5)
WBC Count: 4.9 10*3/uL (ref 4.0–10.5)
nRBC: 0 % (ref 0.0–0.2)

## 2023-08-12 LAB — CMP (CANCER CENTER ONLY)
ALT: 39 U/L (ref 0–44)
AST: 25 U/L (ref 15–41)
Albumin: 4.5 g/dL (ref 3.5–5.0)
Alkaline Phosphatase: 57 U/L (ref 38–126)
Anion gap: 7 (ref 5–15)
BUN: 16 mg/dL (ref 6–20)
CO2: 25 mmol/L (ref 22–32)
Calcium: 9.2 mg/dL (ref 8.9–10.3)
Chloride: 106 mmol/L (ref 98–111)
Creatinine: 1.38 mg/dL — ABNORMAL HIGH (ref 0.61–1.24)
GFR, Estimated: 59 mL/min — ABNORMAL LOW (ref >60)
Glucose, Bld: 101 mg/dL — ABNORMAL HIGH (ref 70–99)
Potassium: 4.5 mmol/L (ref 3.5–5.1)
Sodium: 138 mmol/L (ref 135–145)
Total Bilirubin: 0.4 mg/dL (ref 0.0–1.2)
Total Protein: 6.8 g/dL (ref 6.5–8.1)

## 2023-08-12 MED ORDER — PROCHLORPERAZINE MALEATE 10 MG PO TABS: 10.0000 mg | ORAL_TABLET | Freq: Once | ORAL | Status: DC

## 2023-08-12 MED ORDER — BORTEZOMIB CHEMO SQ INJECTION 3.5 MG (2.5MG/ML)
1.3000 mg/m2 | Freq: Once | INTRAMUSCULAR | Status: AC
  Administered 2023-08-12: 2.75 mg via SUBCUTANEOUS
  Filled 2023-08-12: qty 1.1

## 2023-08-12 NOTE — Patient Instructions (Signed)
 [facility]  Discharge Instructions: Thank you for choosing [facility] to provide your oncology and hematology care.   If you have a lab appointment with the Cancer Center, please go directly to the Cancer Center and check in at the registration area.  Wear comfortable clothing and clothing appropriate for easy access to any Portacath or PICC line.   We strive to give you quality time with your provider. You may need to reschedule your appointment if you arrive late (15 or more minutes).  Arriving late affects you and other patients whose appointments are after yours.  Also, if you miss three or more appointments without notifying the office, you may be dismissed from the clinic at the provider's discretion.      For prescription refill requests, have your pharmacy contact our office and allow 72 hours for refills to be completed.    Today you received the following chemotherapy and/or immunotherapy agents Velcade      To help prevent nausea and vomiting after your treatment, we encourage you to take your nausea medication as directed.  BELOW ARE SYMPTOMS THAT SHOULD BE REPORTED IMMEDIATELY: *FEVER GREATER THAN 100.4 F (38 C) OR HIGHER *CHILLS OR SWEATING *NAUSEA AND VOMITING THAT IS NOT CONTROLLED WITH YOUR NAUSEA MEDICATION *UNUSUAL SHORTNESS OF BREATH *UNUSUAL BRUISING OR BLEEDING *URINARY PROBLEMS (pain or burning when urinating, or frequent urination) *BOWEL PROBLEMS (unusual diarrhea, constipation, pain near the anus) TENDERNESS IN MOUTH AND THROAT WITH OR WITHOUT PRESENCE OF ULCERS (sore throat, sores in mouth, or a toothache) UNUSUAL RASH, SWELLING OR PAIN  UNUSUAL VAGINAL DISCHARGE OR ITCHING   Items with * indicate a potential emergency and should be followed up as soon as possible or go to the Emergency Department if any problems should occur.  Please show the CHEMOTHERAPY ALERT CARD or IMMUNOTHERAPY  ALERT CARD at check-in to the Emergency Department and triage nurse. Should you have questions after your visit or need to cancel or reschedule your appointment, please contact [facility] [facility] - A DEPT OF [facility]  [phone] and follow the prompts.  Office hours are 8:00 a.m. to 4:30 p.m. Monday - Friday. Please note that voicemails left after 4:00 p.m. may not be returned until the following business day.  We are closed weekends and major holidays. You have access to a nurse at all times for urgent questions. Please call the main number to the clinic [phone] and follow the prompts.  For any non-urgent questions, you may also contact your provider using MyChart. We now offer e-Visits for anyone 69 and older to request care online for non-urgent symptoms. For details visit [email or web address].   Also download the MyChart app! Go to the app store, search "MyChart", open the app, select [place], and log in with your MyChart username and password.

## 2023-09-02 ENCOUNTER — Inpatient Hospital Stay: Attending: Hematology & Oncology

## 2023-09-02 ENCOUNTER — Encounter: Payer: Self-pay | Admitting: Hematology & Oncology

## 2023-09-02 ENCOUNTER — Inpatient Hospital Stay: Admitting: Medical Oncology

## 2023-09-02 ENCOUNTER — Inpatient Hospital Stay

## 2023-09-02 VITALS — BP 138/82 | HR 52 | Temp 97.7°F | Resp 18

## 2023-09-02 DIAGNOSIS — Z5112 Encounter for antineoplastic immunotherapy: Secondary | ICD-10-CM | POA: Insufficient documentation

## 2023-09-02 DIAGNOSIS — C9001 Multiple myeloma in remission: Secondary | ICD-10-CM

## 2023-09-02 DIAGNOSIS — C9000 Multiple myeloma not having achieved remission: Secondary | ICD-10-CM | POA: Diagnosis not present

## 2023-09-02 LAB — CBC WITH DIFFERENTIAL (CANCER CENTER ONLY)
Abs Immature Granulocytes: 0.02 10*3/uL (ref 0.00–0.07)
Basophils Absolute: 0 10*3/uL (ref 0.0–0.1)
Basophils Relative: 0 %
Eosinophils Absolute: 0.1 10*3/uL (ref 0.0–0.5)
Eosinophils Relative: 2 %
HCT: 41.4 % (ref 39.0–52.0)
Hemoglobin: 14.7 g/dL (ref 13.0–17.0)
Immature Granulocytes: 1 %
Lymphocytes Relative: 27 %
Lymphs Abs: 1.2 10*3/uL (ref 0.7–4.0)
MCH: 34 pg (ref 26.0–34.0)
MCHC: 35.5 g/dL (ref 30.0–36.0)
MCV: 95.8 fL (ref 80.0–100.0)
Monocytes Absolute: 0.3 10*3/uL (ref 0.1–1.0)
Monocytes Relative: 7 %
Neutro Abs: 2.8 10*3/uL (ref 1.7–7.7)
Neutrophils Relative %: 63 %
Platelet Count: 132 10*3/uL — ABNORMAL LOW (ref 150–400)
RBC: 4.32 MIL/uL (ref 4.22–5.81)
RDW: 12.7 % (ref 11.5–15.5)
WBC Count: 4.4 10*3/uL (ref 4.0–10.5)
nRBC: 0 % (ref 0.0–0.2)

## 2023-09-02 LAB — CMP (CANCER CENTER ONLY)
ALT: 39 U/L (ref 0–44)
AST: 24 U/L (ref 15–41)
Albumin: 4.7 g/dL (ref 3.5–5.0)
Alkaline Phosphatase: 53 U/L (ref 38–126)
Anion gap: 7 (ref 5–15)
BUN: 17 mg/dL (ref 6–20)
CO2: 25 mmol/L (ref 22–32)
Calcium: 8.9 mg/dL (ref 8.9–10.3)
Chloride: 107 mmol/L (ref 98–111)
Creatinine: 1.5 mg/dL — ABNORMAL HIGH (ref 0.61–1.24)
GFR, Estimated: 54 mL/min — ABNORMAL LOW (ref >60)
Glucose, Bld: 94 mg/dL (ref 70–99)
Potassium: 4.5 mmol/L (ref 3.5–5.1)
Sodium: 139 mmol/L (ref 135–145)
Total Bilirubin: 0.7 mg/dL (ref 0.0–1.2)
Total Protein: 6.9 g/dL (ref 6.5–8.1)

## 2023-09-02 MED ORDER — PROCHLORPERAZINE MALEATE 10 MG PO TABS
10.0000 mg | ORAL_TABLET | Freq: Once | ORAL | Status: DC
Start: 2023-09-02 — End: 2023-09-02

## 2023-09-02 MED ORDER — BORTEZOMIB CHEMO SQ INJECTION 3.5 MG (2.5MG/ML)
1.3000 mg/m2 | Freq: Once | INTRAMUSCULAR | Status: AC
  Administered 2023-09-02: 2.75 mg via SUBCUTANEOUS
  Filled 2023-09-02: qty 1.1

## 2023-09-02 NOTE — Patient Instructions (Signed)
 [facility]  Discharge Instructions: Thank you for choosing [facility] to provide your oncology and hematology care.   If you have a lab appointment with the Cancer Center, please go directly to the Cancer Center and check in at the registration area.  Wear comfortable clothing and clothing appropriate for easy access to any Portacath or PICC line.   We strive to give you quality time with your provider. You may need to reschedule your appointment if you arrive late (15 or more minutes).  Arriving late affects you and other patients whose appointments are after yours.  Also, if you miss three or more appointments without notifying the office, you may be dismissed from the clinic at the provider's discretion.      For prescription refill requests, have your pharmacy contact our office and allow 72 hours for refills to be completed.    Today you received the following chemotherapy and/or immunotherapy agents Velcade      To help prevent nausea and vomiting after your treatment, we encourage you to take your nausea medication as directed.  BELOW ARE SYMPTOMS THAT SHOULD BE REPORTED IMMEDIATELY: *FEVER GREATER THAN 100.4 F (38 C) OR HIGHER *CHILLS OR SWEATING *NAUSEA AND VOMITING THAT IS NOT CONTROLLED WITH YOUR NAUSEA MEDICATION *UNUSUAL SHORTNESS OF BREATH *UNUSUAL BRUISING OR BLEEDING *URINARY PROBLEMS (pain or burning when urinating, or frequent urination) *BOWEL PROBLEMS (unusual diarrhea, constipation, pain near the anus) TENDERNESS IN MOUTH AND THROAT WITH OR WITHOUT PRESENCE OF ULCERS (sore throat, sores in mouth, or a toothache) UNUSUAL RASH, SWELLING OR PAIN  UNUSUAL VAGINAL DISCHARGE OR ITCHING   Items with * indicate a potential emergency and should be followed up as soon as possible or go to the Emergency Department if any problems should occur.  Please show the CHEMOTHERAPY ALERT CARD or IMMUNOTHERAPY  ALERT CARD at check-in to the Emergency Department and triage nurse. Should you have questions after your visit or need to cancel or reschedule your appointment, please contact [facility] [facility] - A DEPT OF [facility]  [phone] and follow the prompts.  Office hours are 8:00 a.m. to 4:30 p.m. Monday - Friday. Please note that voicemails left after 4:00 p.m. may not be returned until the following business day.  We are closed weekends and major holidays. You have access to a nurse at all times for urgent questions. Please call the main number to the clinic [phone] and follow the prompts.  For any non-urgent questions, you may also contact your provider using MyChart. We now offer e-Visits for anyone 69 and older to request care online for non-urgent symptoms. For details visit [email or web address].   Also download the MyChart app! Go to the app store, search "MyChart", open the app, select [place], and log in with your MyChart username and password.

## 2023-09-16 ENCOUNTER — Other Ambulatory Visit: Payer: Self-pay

## 2023-09-23 ENCOUNTER — Inpatient Hospital Stay: Admitting: Family

## 2023-09-23 ENCOUNTER — Inpatient Hospital Stay

## 2023-09-23 ENCOUNTER — Inpatient Hospital Stay: Attending: Hematology & Oncology

## 2023-09-23 ENCOUNTER — Encounter: Payer: Self-pay | Admitting: Hematology & Oncology

## 2023-09-23 VITALS — BP 126/76 | HR 53 | Resp 16 | Ht 78.0 in | Wt 216.0 lb

## 2023-09-23 DIAGNOSIS — Z5112 Encounter for antineoplastic immunotherapy: Secondary | ICD-10-CM | POA: Insufficient documentation

## 2023-09-23 DIAGNOSIS — C9000 Multiple myeloma not having achieved remission: Secondary | ICD-10-CM | POA: Insufficient documentation

## 2023-09-23 DIAGNOSIS — C9001 Multiple myeloma in remission: Secondary | ICD-10-CM

## 2023-09-23 LAB — CMP (CANCER CENTER ONLY)
ALT: 39 U/L (ref 0–44)
AST: 34 U/L (ref 15–41)
Albumin: 4.8 g/dL (ref 3.5–5.0)
Alkaline Phosphatase: 63 U/L (ref 38–126)
Anion gap: 7 (ref 5–15)
BUN: 20 mg/dL (ref 6–20)
CO2: 25 mmol/L (ref 22–32)
Calcium: 9 mg/dL (ref 8.9–10.3)
Chloride: 106 mmol/L (ref 98–111)
Creatinine: 1.61 mg/dL — ABNORMAL HIGH (ref 0.61–1.24)
GFR, Estimated: 49 mL/min — ABNORMAL LOW (ref >60)
Glucose, Bld: 91 mg/dL (ref 70–99)
Potassium: 4.3 mmol/L (ref 3.5–5.1)
Sodium: 138 mmol/L (ref 135–145)
Total Bilirubin: 0.9 mg/dL (ref 0.0–1.2)
Total Protein: 7.3 g/dL (ref 6.5–8.1)

## 2023-09-23 LAB — CBC WITH DIFFERENTIAL (CANCER CENTER ONLY)
Abs Immature Granulocytes: 0 10*3/uL (ref 0.00–0.07)
Basophils Absolute: 0 10*3/uL (ref 0.0–0.1)
Basophils Relative: 0 %
Eosinophils Absolute: 0 10*3/uL (ref 0.0–0.5)
Eosinophils Relative: 1 %
HCT: 40.7 % (ref 39.0–52.0)
Hemoglobin: 14.3 g/dL (ref 13.0–17.0)
Immature Granulocytes: 0 %
Lymphocytes Relative: 35 %
Lymphs Abs: 1.2 10*3/uL (ref 0.7–4.0)
MCH: 34.2 pg — ABNORMAL HIGH (ref 26.0–34.0)
MCHC: 35.1 g/dL (ref 30.0–36.0)
MCV: 97.4 fL (ref 80.0–100.0)
Monocytes Absolute: 0.4 10*3/uL (ref 0.1–1.0)
Monocytes Relative: 12 %
Neutro Abs: 1.9 10*3/uL (ref 1.7–7.7)
Neutrophils Relative %: 52 %
Platelet Count: 141 10*3/uL — ABNORMAL LOW (ref 150–400)
RBC: 4.18 MIL/uL — ABNORMAL LOW (ref 4.22–5.81)
RDW: 13.1 % (ref 11.5–15.5)
WBC Count: 3.6 10*3/uL — ABNORMAL LOW (ref 4.0–10.5)
nRBC: 0 % (ref 0.0–0.2)

## 2023-09-23 LAB — LACTATE DEHYDROGENASE: LDH: 170 U/L (ref 98–192)

## 2023-09-23 MED ORDER — BORTEZOMIB CHEMO SQ INJECTION 3.5 MG (2.5MG/ML)
1.3000 mg/m2 | Freq: Once | INTRAMUSCULAR | Status: AC
  Administered 2023-09-23: 2.75 mg via SUBCUTANEOUS
  Filled 2023-09-23: qty 1.1

## 2023-09-23 MED ORDER — PROCHLORPERAZINE MALEATE 10 MG PO TABS: 10.0000 mg | ORAL_TABLET | Freq: Once | ORAL | Status: DC

## 2023-09-23 NOTE — Progress Notes (Signed)
 Hematology and Oncology Follow Up Visit  Tyler Phillips [id number] 1966-02-25 58 y.o. 09/23/2023   Principle Diagnosis:  IgG Kappa myeloma - +4, +14, +17   Past Therapy: Status post autologous stem cell transplant on 59/05/2015   S/p cycle 6 of RVD Ninlaro  4 mg po q 2 week -- start on 01/02/2019 -- d/c on 02/10/2019   Current Therapy:        Velcade  q 2wk dosing Revlimid  10mg  po q day (21/7)  - d/c on 12/07/2021   Interim History:  Tyler Phillips is here today for follow-up and treatment. He is doing well and has no complaints at this time.  He is preparing for a trip to [place]  to move his daughter and also to ride motorcycles.  He is working out several days a week. No fatigue or weakness noted.  No M-spike noted in April and kappa light chains 1.83 mg/dL.  No issue with infections. No fever, chills, n/v, cough, rash, dizziness, SOB, chest pain, palpitations, abdominal pain or changes in bowel or bladder habits.  No swelling, tenderness in his extremities.  Neuropathy in the lower extremities is unchanged from baseline.  Appetite and hydration are good. Weight is stable at 216 lbs.  No blood loss, bruising or petechiae noted.   ECOG Performance Status: 0 - Asymptomatic  Medications:  Allergies as of 09/23/2023   No Known Allergies      Medication List        Accurate as of September 23, 2023 10:37 AM. If you have any questions, ask your nurse or doctor.          aspirin  EC 81 MG tablet Take 81 mg by mouth daily. Swallow whole.   bortezomib  IV 3.5 MG injection Commonly known as: VELCADE  1 mg/m2 every 14 (fourteen) days.   cetirizine  10 MG tablet Commonly known as: ZYRTEC  Take 10 mg by mouth daily.   Jatenzo 237 MG Caps Generic drug: Testosterone  Undecanoate Take 1 capsule by mouth 2 (two) times daily.   multivitamin with minerals Tabs tablet Take 1 tablet by mouth daily.   ondansetron  4 MG tablet Commonly known as: ZOFRAN  Take 1 tablet (4 mg total) by mouth  every 6 (six) hours as needed for nausea.   valACYclovir  500 MG tablet Commonly known as: VALTREX  Take 500 mg by mouth 2 (two) times daily.   zolpidem  10 MG tablet Commonly known as: AMBIEN  Take 1 tablet (10 mg total) by mouth at bedtime as needed for sleep.        Allergies: No Known Allergies  Past Medical History, Surgical history, Social history, and Family History were reviewed and updated.  Review of Systems: All other 10 point review of systems is negative.   Physical Exam:  height is 6\' 6"  (1.981 m) and weight is 216 lb (98 kg). His blood pressure is 126/76 and his pulse is 53 (abnormal). His respiration is 16 and oxygen saturation is 98%.   Wt Readings from Last 3 Encounters:  09/23/23 216 lb (98 kg)  07/22/23 220 lb 1.3 oz (99.8 kg)  06/21/23 233 lb (105.7 kg)    Ocular: Sclerae unicteric, pupils equal, round and reactive to light Ear-nose-throat: Oropharynx clear, dentition fair Lymphatic: No cervical or supraclavicular adenopathy Lungs no rales or rhonchi, good excursion bilaterally Heart regular rate and rhythm, no murmur appreciated Abd soft, nontender, positive bowel sounds MSK no focal spinal tenderness, no joint edema Neuro: non-focal, well-oriented, appropriate affect Breasts: deferred   Lab Results  Component Value  Date   WBC 3.6 (L) 09/23/2023   HGB 14.3 09/23/2023   HCT 40.7 09/23/2023   MCV 97.4 09/23/2023   PLT 141 (L) 09/23/2023   Lab Results  Component Value Date   FERRITIN 1,263 (H) 07/20/2014   IRON 125 07/20/2014   TIBC 198 (L) 07/20/2014   UIBC 73 (L) 07/20/2014   IRONPCTSAT 63 (H) 07/20/2014   Lab Results  Component Value Date   RETICCTPCT 0.8 07/20/2014   RBC 4.18 (L) 09/23/2023   Lab Results  Component Value Date   KPAFRELGTCHN 18.3 07/22/2023   LAMBDASER 17.2 07/22/2023   KAPLAMBRATIO 1.06 07/22/2023   Lab Results  Component Value Date   IGGSERUM 1,105 06/02/2023   IGA 150 06/02/2023   IGMSERUM 27 06/02/2023    Lab Results  Component Value Date   TOTALPROTELP 6.7 07/22/2023   ALBUMINELP 3.9 07/22/2023   A1GS 0.2 07/22/2023   A2GS 0.7 07/22/2023   BETS 0.9 07/22/2023   BETA2SER 0.3 03/28/2015   GAMS 1.1 07/22/2023   MSPIKE Not Observed 07/22/2023   SPEI Comment 09/27/2022     Chemistry      Component Value Date/Time   NA 139 09/02/2023 0902   NA 140 04/22/2017 1140   NA 138 09/03/2015 1042   K 4.5 09/02/2023 0902   K 4.0 04/22/2017 1140   K 4.3 09/03/2015 1042   CL 107 09/02/2023 0902   CL 105 04/22/2017 1140   CO2 25 09/02/2023 0902   CO2 24 04/22/2017 1140   CO2 25 09/03/2015 1042   BUN 17 09/02/2023 0902   BUN 16 04/22/2017 1140   BUN 21.7 09/03/2015 1042   CREATININE 1.50 (H) 09/02/2023 0902   CREATININE 1.6 (H) 04/22/2017 1140   CREATININE 1.2 09/03/2015 1042      Component Value Date/Time   CALCIUM  8.9 09/02/2023 0902   CALCIUM  8.5 04/22/2017 1140   CALCIUM  9.1 09/03/2015 1042   ALKPHOS 53 09/02/2023 0902   ALKPHOS 59 04/22/2017 1140   ALKPHOS 42 09/03/2015 1042   AST 24 09/02/2023 0902   AST 27 09/03/2015 1042   ALT 39 09/02/2023 0902   ALT 45 04/22/2017 1140   ALT 41 09/03/2015 1042   BILITOT 0.7 09/02/2023 0902   BILITOT 0.54 09/03/2015 1042       Impression and Plan: Tyler Phillips is a very pleasant 58 yo caucasian gentleman with IgG kappa myeloma. He underwent induction chemotherapy with RVD followed by an autologous stem cell transplant at [facility] on February 2017.  So far, he is doing incredibly well on single agent Velcade  and there has not been any evidence of progressive myeloma. Myeloma studies are pending.  Lab and treatment every 3 weeks and follow-up in 9 weeks.   Kennard Pea, NP 6/6/202510:37 AM

## 2023-09-23 NOTE — Progress Notes (Signed)
Reviewed pt labs with Dr. [name] and pt ok to treat with creatinine 1.6

## 2023-09-23 NOTE — Patient Instructions (Signed)
 [facility]  Discharge Instructions: Thank you for choosing [facility] to provide your oncology and hematology care.   If you have a lab appointment with the Cancer Center, please go directly to the Cancer Center and check in at the registration area.  Wear comfortable clothing and clothing appropriate for easy access to any Portacath or PICC line.   We strive to give you quality time with your provider. You may need to reschedule your appointment if you arrive late (15 or more minutes).  Arriving late affects you and other patients whose appointments are after yours.  Also, if you miss three or more appointments without notifying the office, you may be dismissed from the clinic at the provider's discretion.      For prescription refill requests, have your pharmacy contact our office and allow 72 hours for refills to be completed.    Today you received the following chemotherapy and/or immunotherapy agents Velcade      To help prevent nausea and vomiting after your treatment, we encourage you to take your nausea medication as directed.  BELOW ARE SYMPTOMS THAT SHOULD BE REPORTED IMMEDIATELY: *FEVER GREATER THAN 100.4 F (38 C) OR HIGHER *CHILLS OR SWEATING *NAUSEA AND VOMITING THAT IS NOT CONTROLLED WITH YOUR NAUSEA MEDICATION *UNUSUAL SHORTNESS OF BREATH *UNUSUAL BRUISING OR BLEEDING *URINARY PROBLEMS (pain or burning when urinating, or frequent urination) *BOWEL PROBLEMS (unusual diarrhea, constipation, pain near the anus) TENDERNESS IN MOUTH AND THROAT WITH OR WITHOUT PRESENCE OF ULCERS (sore throat, sores in mouth, or a toothache) UNUSUAL RASH, SWELLING OR PAIN  UNUSUAL VAGINAL DISCHARGE OR ITCHING   Items with * indicate a potential emergency and should be followed up as soon as possible or go to the Emergency Department if any problems should occur.  Please show the CHEMOTHERAPY ALERT CARD or IMMUNOTHERAPY  ALERT CARD at check-in to the Emergency Department and triage nurse. Should you have questions after your visit or need to cancel or reschedule your appointment, please contact [facility] [facility] - A DEPT OF [facility]  [phone] and follow the prompts.  Office hours are 8:00 a.m. to 4:30 p.m. Monday - Friday. Please note that voicemails left after 4:00 p.m. may not be returned until the following business day.  We are closed weekends and major holidays. You have access to a nurse at all times for urgent questions. Please call the main number to the clinic [phone] and follow the prompts.  For any non-urgent questions, you may also contact your provider using MyChart. We now offer e-Visits for anyone 69 and older to request care online for non-urgent symptoms. For details visit [email or web address].   Also download the MyChart app! Go to the app store, search "MyChart", open the app, select [place], and log in with your MyChart username and password.

## 2023-09-24 LAB — IGG, IGA, IGM
IgA: 141 mg/dL (ref 90–386)
IgG (Immunoglobin G), Serum: 1072 mg/dL (ref 603–1613)
IgM (Immunoglobulin M), Srm: 33 mg/dL (ref 20–172)

## 2023-09-26 LAB — KAPPA/LAMBDA LIGHT CHAINS
Kappa free light chain: 24.4 mg/L — ABNORMAL HIGH (ref 3.3–19.4)
Kappa, lambda light chain ratio: 1.15 (ref 0.26–1.65)
Lambda free light chains: 21.3 mg/L (ref 5.7–26.3)

## 2023-09-27 ENCOUNTER — Other Ambulatory Visit: Payer: Self-pay

## 2023-09-27 LAB — PROTEIN ELECTROPHORESIS, SERUM, WITH REFLEX
A/G Ratio: 1.3 (ref 0.7–1.7)
Albumin ELP: 4 g/dL (ref 2.9–4.4)
Alpha-1-Globulin: 0.2 g/dL (ref 0.0–0.4)
Alpha-2-Globulin: 0.7 g/dL (ref 0.4–1.0)
Beta Globulin: 1 g/dL (ref 0.7–1.3)
Gamma Globulin: 1.1 g/dL (ref 0.4–1.8)
Globulin, Total: 3 g/dL (ref 2.2–3.9)
Total Protein ELP: 7 g/dL (ref 6.0–8.5)

## 2023-10-03 ENCOUNTER — Other Ambulatory Visit: Payer: Self-pay

## 2023-10-14 ENCOUNTER — Inpatient Hospital Stay

## 2023-10-14 ENCOUNTER — Encounter: Payer: Self-pay | Admitting: Hematology & Oncology

## 2023-10-14 VITALS — BP 126/84 | HR 57 | Temp 98.0°F | Resp 19

## 2023-10-14 DIAGNOSIS — Z5112 Encounter for antineoplastic immunotherapy: Secondary | ICD-10-CM | POA: Diagnosis not present

## 2023-10-14 DIAGNOSIS — C9001 Multiple myeloma in remission: Secondary | ICD-10-CM

## 2023-10-14 DIAGNOSIS — C9000 Multiple myeloma not having achieved remission: Secondary | ICD-10-CM | POA: Diagnosis not present

## 2023-10-14 LAB — CMP (CANCER CENTER ONLY)
ALT: 27 U/L (ref 0–44)
AST: 21 U/L (ref 15–41)
Albumin: 4.6 g/dL (ref 3.5–5.0)
Alkaline Phosphatase: 55 U/L (ref 38–126)
Anion gap: 6 (ref 5–15)
BUN: 16 mg/dL (ref 6–20)
CO2: 26 mmol/L (ref 22–32)
Calcium: 9.2 mg/dL (ref 8.9–10.3)
Chloride: 106 mmol/L (ref 98–111)
Creatinine: 1.49 mg/dL — ABNORMAL HIGH (ref 0.61–1.24)
GFR, Estimated: 54 mL/min — ABNORMAL LOW (ref >60)
Glucose, Bld: 92 mg/dL (ref 70–99)
Potassium: 4.8 mmol/L (ref 3.5–5.1)
Sodium: 138 mmol/L (ref 135–145)
Total Bilirubin: 0.9 mg/dL (ref 0.0–1.2)
Total Protein: 6.5 g/dL (ref 6.5–8.1)

## 2023-10-14 LAB — CBC WITH DIFFERENTIAL (CANCER CENTER ONLY)
Abs Immature Granulocytes: 0.01 10*3/uL (ref 0.00–0.07)
Basophils Absolute: 0 10*3/uL (ref 0.0–0.1)
Basophils Relative: 0 %
Eosinophils Absolute: 0.1 10*3/uL (ref 0.0–0.5)
Eosinophils Relative: 1 %
HCT: 40 % (ref 39.0–52.0)
Hemoglobin: 14.4 g/dL (ref 13.0–17.0)
Immature Granulocytes: 0 %
Lymphocytes Relative: 26 %
Lymphs Abs: 1.3 10*3/uL (ref 0.7–4.0)
MCH: 34.7 pg — ABNORMAL HIGH (ref 26.0–34.0)
MCHC: 36 g/dL (ref 30.0–36.0)
MCV: 96.4 fL (ref 80.0–100.0)
Monocytes Absolute: 0.4 10*3/uL (ref 0.1–1.0)
Monocytes Relative: 7 %
Neutro Abs: 3.3 10*3/uL (ref 1.7–7.7)
Neutrophils Relative %: 66 %
Platelet Count: 149 10*3/uL — ABNORMAL LOW (ref 150–400)
RBC: 4.15 MIL/uL — ABNORMAL LOW (ref 4.22–5.81)
RDW: 12.5 % (ref 11.5–15.5)
WBC Count: 5.1 10*3/uL (ref 4.0–10.5)
nRBC: 0.4 % — ABNORMAL HIGH (ref 0.0–0.2)

## 2023-10-14 LAB — LACTATE DEHYDROGENASE: LDH: 154 U/L (ref 98–192)

## 2023-10-14 MED ORDER — PROCHLORPERAZINE MALEATE 10 MG PO TABS: 10.0000 mg | ORAL_TABLET | Freq: Once | ORAL | Status: DC

## 2023-10-14 MED ORDER — BORTEZOMIB CHEMO SQ INJECTION 3.5 MG (2.5MG/ML)
1.3000 mg/m2 | Freq: Once | INTRAMUSCULAR | Status: AC
  Administered 2023-10-14: 2.75 mg via SUBCUTANEOUS
  Filled 2023-10-14: qty 1.1

## 2023-10-15 LAB — IGG, IGA, IGM
IgA: 144 mg/dL (ref 90–386)
IgG (Immunoglobin G), Serum: 1064 mg/dL (ref 603–1613)
IgM (Immunoglobulin M), Srm: 44 mg/dL (ref 20–172)

## 2023-10-17 LAB — PROTEIN ELECTROPHORESIS, SERUM
A/G Ratio: 1.8 — ABNORMAL HIGH (ref 0.7–1.7)
Albumin ELP: 4.3 g/dL (ref 2.9–4.4)
Alpha-1-Globulin: 0.1 g/dL (ref 0.0–0.4)
Alpha-2-Globulin: 0.5 g/dL (ref 0.4–1.0)
Beta Globulin: 0.8 g/dL (ref 0.7–1.3)
Gamma Globulin: 0.9 g/dL (ref 0.4–1.8)
Globulin, Total: 2.4 g/dL (ref 2.2–3.9)
Total Protein ELP: 6.7 g/dL (ref 6.0–8.5)

## 2023-10-17 LAB — KAPPA/LAMBDA LIGHT CHAINS
Kappa free light chain: 18.1 mg/L (ref 3.3–19.4)
Kappa, lambda light chain ratio: 1 (ref 0.26–1.65)
Lambda free light chains: 18.1 mg/L (ref 5.7–26.3)

## 2023-11-04 ENCOUNTER — Inpatient Hospital Stay

## 2023-11-04 ENCOUNTER — Encounter: Payer: Self-pay | Admitting: Hematology & Oncology

## 2023-11-04 ENCOUNTER — Inpatient Hospital Stay: Attending: Hematology & Oncology

## 2023-11-04 VITALS — BP 146/95 | HR 63 | Temp 97.7°F

## 2023-11-04 DIAGNOSIS — C9001 Multiple myeloma in remission: Secondary | ICD-10-CM | POA: Insufficient documentation

## 2023-11-04 DIAGNOSIS — Z5112 Encounter for antineoplastic immunotherapy: Secondary | ICD-10-CM | POA: Diagnosis not present

## 2023-11-04 LAB — CMP (CANCER CENTER ONLY)
ALT: 29 U/L (ref 0–44)
AST: 21 U/L (ref 15–41)
Albumin: 4.7 g/dL (ref 3.5–5.0)
Alkaline Phosphatase: 55 U/L (ref 38–126)
Anion gap: 10 (ref 5–15)
BUN: 17 mg/dL (ref 6–20)
CO2: 22 mmol/L (ref 22–32)
Calcium: 9.5 mg/dL (ref 8.9–10.3)
Chloride: 105 mmol/L (ref 98–111)
Creatinine: 1.43 mg/dL — ABNORMAL HIGH (ref 0.61–1.24)
GFR, Estimated: 57 mL/min — ABNORMAL LOW (ref >60)
Glucose, Bld: 109 mg/dL — ABNORMAL HIGH (ref 70–99)
Potassium: 4.3 mmol/L (ref 3.5–5.1)
Sodium: 137 mmol/L (ref 135–145)
Total Bilirubin: 0.8 mg/dL (ref 0.0–1.2)
Total Protein: 7.2 g/dL (ref 6.5–8.1)

## 2023-11-04 LAB — CBC WITH DIFFERENTIAL (CANCER CENTER ONLY)
Abs Immature Granulocytes: 0.01 K/uL (ref 0.00–0.07)
Basophils Absolute: 0 K/uL (ref 0.0–0.1)
Basophils Relative: 0 %
Eosinophils Absolute: 0.1 K/uL (ref 0.0–0.5)
Eosinophils Relative: 1 %
HCT: 42.2 % (ref 39.0–52.0)
Hemoglobin: 15.2 g/dL (ref 13.0–17.0)
Immature Granulocytes: 0 %
Lymphocytes Relative: 30 %
Lymphs Abs: 1.6 K/uL (ref 0.7–4.0)
MCH: 34.5 pg — ABNORMAL HIGH (ref 26.0–34.0)
MCHC: 36 g/dL (ref 30.0–36.0)
MCV: 95.9 fL (ref 80.0–100.0)
Monocytes Absolute: 0.4 K/uL (ref 0.1–1.0)
Monocytes Relative: 8 %
Neutro Abs: 3.3 K/uL (ref 1.7–7.7)
Neutrophils Relative %: 61 %
Platelet Count: 149 K/uL — ABNORMAL LOW (ref 150–400)
RBC: 4.4 MIL/uL (ref 4.22–5.81)
RDW: 12.1 % (ref 11.5–15.5)
WBC Count: 5.5 K/uL (ref 4.0–10.5)
nRBC: 0 % (ref 0.0–0.2)

## 2023-11-04 MED ORDER — BORTEZOMIB CHEMO SQ INJECTION 3.5 MG (2.5MG/ML)
1.3000 mg/m2 | Freq: Once | INTRAMUSCULAR | Status: AC
  Administered 2023-11-04: 2.75 mg via SUBCUTANEOUS
  Filled 2023-11-04: qty 1.1

## 2023-11-04 MED ORDER — PROCHLORPERAZINE MALEATE 10 MG PO TABS
10.0000 mg | ORAL_TABLET | Freq: Once | ORAL | Status: DC
Start: 2023-11-04 — End: 2023-11-04

## 2023-11-04 NOTE — Patient Instructions (Signed)
 Bortezomib Injection What is this medication? BORTEZOMIB (bor TEZ oh mib) treats lymphoma. It may also be used to treat multiple myeloma, a type of bone marrow cancer. It works by blocking a protein that causes cancer cells to grow and multiply. This helps to slow or stop the spread of cancer cells. This medicine may be used for other purposes; ask your health care provider or pharmacist if you have questions. COMMON BRAND NAME(S): BORUZU, Velcade What should I tell my care team before I take this medication? They need to know if you have any of these conditions: Dehydration Diabetes Heart disease Liver disease Tingling of the fingers or toes or other nerve disorder An unusual or allergic reaction to bortezomib, other medications, foods, dyes, or preservatives If you or your partner are pregnant or trying to get pregnant Breastfeeding How should I use this medication? This medication is injected into a vein or under the skin. It is given by your care team in a hospital or clinic setting. Talk to your care team about the use of this medication in children. Special care may be needed. Overdosage: If you think you have taken too much of this medicine contact a poison control center or emergency room at once. NOTE: This medicine is only for you. Do not share this medicine with others. What if I miss a dose? Keep appointments for follow-up doses. It is important not to miss your dose. Call your care team if you are unable to keep an appointment. What may interact with this medication? Ketoconazole Rifampin This list may not describe all possible interactions. Give your health care provider a list of all the medicines, herbs, non-prescription drugs, or dietary supplements you use. Also tell them if you smoke, drink alcohol, or use illegal drugs. Some items may interact with your medicine. What should I watch for while using this medication? Your condition will be monitored carefully while you  are receiving this medication. You may need blood work while taking this medication. This medication may affect your coordination, reaction time, or judgment. Do not drive or operate machinery until you know how this medication affects you. Sit up or stand slowly to reduce the risk of dizzy or fainting spells. Drinking alcohol with this medication can increase the risk of these side effects. This medication may increase your risk of getting an infection. Call your care team for advice if you get a fever, chills, sore throat, or other symptoms of a cold or flu. Do not treat yourself. Try to avoid being around people who are sick. Check with your care team if you have severe diarrhea, nausea, and vomiting, or if you sweat a lot. The loss of too much body fluid may make it dangerous for you to take this medication. Talk to your care team if you may be pregnant. Serious birth defects can occur if you take this medication during pregnancy and for 7 months after the last dose. You will need a negative pregnancy test before starting this medication. Contraception is recommended while taking this medication and for 7 months after the last dose. Your care team can help you find the option that works for you. If your partner can get pregnant, use a condom during sex while taking this medication and for 4 months after the last dose. Do not breastfeed while taking this medication and for 2 months after the last dose. This medication may cause infertility. Talk to your care team if you are concerned about your fertility. What side  effects may I notice from receiving this medication? Side effects that you should report to your care team as soon as possible: Allergic reactions--skin rash, itching, hives, swelling of the face, lips, tongue, or throat Bleeding--bloody or black, tar-like stools, vomiting blood or brown material that looks like coffee grounds, red or dark brown urine, small red or purple spots on skin,  unusual bruising or bleeding Bleeding in the brain--severe headache, stiff neck, confusion, dizziness, change in vision, numbness or weakness of the face, arm, or leg, trouble speaking, trouble walking, vomiting Bowel blockage--stomach cramping, unable to have a bowel movement or pass gas, loss of appetite, vomiting Heart failure--shortness of breath, swelling of the ankles, feet, or hands, sudden weight gain, unusual weakness or fatigue Infection--fever, chills, cough, sore throat, wounds that don't heal, pain or trouble when passing urine, general feeling of discomfort or being unwell Liver injury--right upper belly pain, loss of appetite, nausea, light-colored stool, dark yellow or brown urine, yellowing skin or eyes, unusual weakness or fatigue Low blood pressure--dizziness, feeling faint or lightheaded, blurry vision Lung injury--shortness of breath or trouble breathing, cough, spitting up blood, chest pain, fever Pain, tingling, or numbness in the hands or feet Severe or prolonged diarrhea Stomach pain, bloody diarrhea, pale skin, unusual weakness or fatigue, decrease in the amount of urine, which may be signs of hemolytic uremic syndrome Sudden and severe headache, confusion, change in vision, seizures, which may be signs of posterior reversible encephalopathy syndrome (PRES) TTP--purple spots on the skin or inside the mouth, pale skin, yellowing skin or eyes, unusual weakness or fatigue, fever, fast or irregular heartbeat, confusion, change in vision, trouble speaking, trouble walking Tumor lysis syndrome (TLS)--nausea, vomiting, diarrhea, decrease in the amount of urine, dark urine, unusual weakness or fatigue, confusion, muscle pain or cramps, fast or irregular heartbeat, joint pain Side effects that usually do not require medical attention (report to your care team if they continue or are bothersome): Constipation Diarrhea Fatigue Loss of appetite Nausea This list may not describe all  possible side effects. Call your doctor for medical advice about side effects. You may report side effects to FDA at 1-800-FDA-1088. Where should I keep my medication? This medication is given in a hospital or clinic. It will not be stored at home. NOTE: This sheet is a summary. It may not cover all possible information. If you have questions about this medicine, talk to your doctor, pharmacist, or health care provider.  2024 Elsevier/Gold Standard (2021-09-08 00:00:00)

## 2023-11-24 ENCOUNTER — Other Ambulatory Visit: Payer: Self-pay

## 2023-11-24 DIAGNOSIS — C9001 Multiple myeloma in remission: Secondary | ICD-10-CM

## 2023-11-25 ENCOUNTER — Inpatient Hospital Stay: Attending: Hematology & Oncology | Admitting: Hematology & Oncology

## 2023-11-25 ENCOUNTER — Inpatient Hospital Stay

## 2023-11-25 ENCOUNTER — Other Ambulatory Visit: Payer: Self-pay

## 2023-11-25 ENCOUNTER — Encounter: Payer: Self-pay | Admitting: Hematology & Oncology

## 2023-11-25 VITALS — BP 146/98 | HR 55 | Temp 97.8°F | Resp 16 | Ht 78.0 in | Wt 216.0 lb

## 2023-11-25 DIAGNOSIS — C9001 Multiple myeloma in remission: Secondary | ICD-10-CM | POA: Diagnosis not present

## 2023-11-25 DIAGNOSIS — Z5112 Encounter for antineoplastic immunotherapy: Secondary | ICD-10-CM | POA: Insufficient documentation

## 2023-11-25 DIAGNOSIS — C9000 Multiple myeloma not having achieved remission: Secondary | ICD-10-CM | POA: Insufficient documentation

## 2023-11-25 LAB — CMP (CANCER CENTER ONLY)
ALT: 53 U/L — ABNORMAL HIGH (ref 0–44)
AST: 32 U/L (ref 15–41)
Albumin: 4.5 g/dL (ref 3.5–5.0)
Alkaline Phosphatase: 68 U/L (ref 38–126)
Anion gap: 9 (ref 5–15)
BUN: 16 mg/dL (ref 6–20)
CO2: 24 mmol/L (ref 22–32)
Calcium: 9.1 mg/dL (ref 8.9–10.3)
Chloride: 105 mmol/L (ref 98–111)
Creatinine: 1.4 mg/dL — ABNORMAL HIGH (ref 0.61–1.24)
GFR, Estimated: 58 mL/min — ABNORMAL LOW (ref >60)
Glucose, Bld: 95 mg/dL (ref 70–99)
Potassium: 4.9 mmol/L (ref 3.5–5.1)
Sodium: 138 mmol/L (ref 135–145)
Total Bilirubin: 0.7 mg/dL (ref 0.0–1.2)
Total Protein: 7.1 g/dL (ref 6.5–8.1)

## 2023-11-25 LAB — CBC WITH DIFFERENTIAL (CANCER CENTER ONLY)
Abs Immature Granulocytes: 0.01 K/uL (ref 0.00–0.07)
Basophils Absolute: 0 K/uL (ref 0.0–0.1)
Basophils Relative: 0 %
Eosinophils Absolute: 0.1 K/uL (ref 0.0–0.5)
Eosinophils Relative: 2 %
HCT: 41.5 % (ref 39.0–52.0)
Hemoglobin: 15 g/dL (ref 13.0–17.0)
Immature Granulocytes: 0 %
Lymphocytes Relative: 26 %
Lymphs Abs: 1.3 K/uL (ref 0.7–4.0)
MCH: 34.9 pg — ABNORMAL HIGH (ref 26.0–34.0)
MCHC: 36.1 g/dL — ABNORMAL HIGH (ref 30.0–36.0)
MCV: 96.5 fL (ref 80.0–100.0)
Monocytes Absolute: 0.4 K/uL (ref 0.1–1.0)
Monocytes Relative: 7 %
Neutro Abs: 3.4 K/uL (ref 1.7–7.7)
Neutrophils Relative %: 65 %
Platelet Count: 140 K/uL — ABNORMAL LOW (ref 150–400)
RBC: 4.3 MIL/uL (ref 4.22–5.81)
RDW: 12.3 % (ref 11.5–15.5)
WBC Count: 5.1 K/uL (ref 4.0–10.5)
nRBC: 0 % (ref 0.0–0.2)

## 2023-11-25 LAB — LACTATE DEHYDROGENASE: LDH: 178 U/L (ref 98–192)

## 2023-11-25 MED ORDER — BORTEZOMIB CHEMO SQ INJECTION 3.5 MG (2.5MG/ML)
1.3000 mg/m2 | Freq: Once | INTRAMUSCULAR | Status: AC
  Administered 2023-11-25: 2.75 mg via SUBCUTANEOUS
  Filled 2023-11-25: qty 1.1

## 2023-11-25 MED ORDER — PROCHLORPERAZINE MALEATE 10 MG PO TABS: 10.0000 mg | ORAL_TABLET | Freq: Once | ORAL | Status: DC

## 2023-11-25 NOTE — Patient Instructions (Signed)
 Bortezomib Injection What is this medication? BORTEZOMIB (bor TEZ oh mib) treats lymphoma. It may also be used to treat multiple myeloma, a type of bone marrow cancer. It works by blocking a protein that causes cancer cells to grow and multiply. This helps to slow or stop the spread of cancer cells. This medicine may be used for other purposes; ask your health care provider or pharmacist if you have questions. COMMON BRAND NAME(S): BORUZU, Velcade What should I tell my care team before I take this medication? They need to know if you have any of these conditions: Dehydration Diabetes Heart disease Liver disease Tingling of the fingers or toes or other nerve disorder An unusual or allergic reaction to bortezomib, other medications, foods, dyes, or preservatives If you or your partner are pregnant or trying to get pregnant Breastfeeding How should I use this medication? This medication is injected into a vein or under the skin. It is given by your care team in a hospital or clinic setting. Talk to your care team about the use of this medication in children. Special care may be needed. Overdosage: If you think you have taken too much of this medicine contact a poison control center or emergency room at once. NOTE: This medicine is only for you. Do not share this medicine with others. What if I miss a dose? Keep appointments for follow-up doses. It is important not to miss your dose. Call your care team if you are unable to keep an appointment. What may interact with this medication? Ketoconazole Rifampin This list may not describe all possible interactions. Give your health care provider a list of all the medicines, herbs, non-prescription drugs, or dietary supplements you use. Also tell them if you smoke, drink alcohol, or use illegal drugs. Some items may interact with your medicine. What should I watch for while using this medication? Your condition will be monitored carefully while you  are receiving this medication. You may need blood work while taking this medication. This medication may affect your coordination, reaction time, or judgment. Do not drive or operate machinery until you know how this medication affects you. Sit up or stand slowly to reduce the risk of dizzy or fainting spells. Drinking alcohol with this medication can increase the risk of these side effects. This medication may increase your risk of getting an infection. Call your care team for advice if you get a fever, chills, sore throat, or other symptoms of a cold or flu. Do not treat yourself. Try to avoid being around people who are sick. Check with your care team if you have severe diarrhea, nausea, and vomiting, or if you sweat a lot. The loss of too much body fluid may make it dangerous for you to take this medication. Talk to your care team if you may be pregnant. Serious birth defects can occur if you take this medication during pregnancy and for 7 months after the last dose. You will need a negative pregnancy test before starting this medication. Contraception is recommended while taking this medication and for 7 months after the last dose. Your care team can help you find the option that works for you. If your partner can get pregnant, use a condom during sex while taking this medication and for 4 months after the last dose. Do not breastfeed while taking this medication and for 2 months after the last dose. This medication may cause infertility. Talk to your care team if you are concerned about your fertility. What side  effects may I notice from receiving this medication? Side effects that you should report to your care team as soon as possible: Allergic reactions--skin rash, itching, hives, swelling of the face, lips, tongue, or throat Bleeding--bloody or black, tar-like stools, vomiting blood or brown material that looks like coffee grounds, red or dark brown urine, small red or purple spots on skin,  unusual bruising or bleeding Bleeding in the brain--severe headache, stiff neck, confusion, dizziness, change in vision, numbness or weakness of the face, arm, or leg, trouble speaking, trouble walking, vomiting Bowel blockage--stomach cramping, unable to have a bowel movement or pass gas, loss of appetite, vomiting Heart failure--shortness of breath, swelling of the ankles, feet, or hands, sudden weight gain, unusual weakness or fatigue Infection--fever, chills, cough, sore throat, wounds that don't heal, pain or trouble when passing urine, general feeling of discomfort or being unwell Liver injury--right upper belly pain, loss of appetite, nausea, light-colored stool, dark yellow or brown urine, yellowing skin or eyes, unusual weakness or fatigue Low blood pressure--dizziness, feeling faint or lightheaded, blurry vision Lung injury--shortness of breath or trouble breathing, cough, spitting up blood, chest pain, fever Pain, tingling, or numbness in the hands or feet Severe or prolonged diarrhea Stomach pain, bloody diarrhea, pale skin, unusual weakness or fatigue, decrease in the amount of urine, which may be signs of hemolytic uremic syndrome Sudden and severe headache, confusion, change in vision, seizures, which may be signs of posterior reversible encephalopathy syndrome (PRES) TTP--purple spots on the skin or inside the mouth, pale skin, yellowing skin or eyes, unusual weakness or fatigue, fever, fast or irregular heartbeat, confusion, change in vision, trouble speaking, trouble walking Tumor lysis syndrome (TLS)--nausea, vomiting, diarrhea, decrease in the amount of urine, dark urine, unusual weakness or fatigue, confusion, muscle pain or cramps, fast or irregular heartbeat, joint pain Side effects that usually do not require medical attention (report to your care team if they continue or are bothersome): Constipation Diarrhea Fatigue Loss of appetite Nausea This list may not describe all  possible side effects. Call your doctor for medical advice about side effects. You may report side effects to FDA at 1-800-FDA-1088. Where should I keep my medication? This medication is given in a hospital or clinic. It will not be stored at home. NOTE: This sheet is a summary. It may not cover all possible information. If you have questions about this medicine, talk to your doctor, pharmacist, or health care provider.  2024 Elsevier/Gold Standard (2021-09-08 00:00:00)

## 2023-11-25 NOTE — Progress Notes (Signed)
 Hematology and Oncology Follow Up Visit  Pernell Lenoir [id number] [birth year] 58 y.o. 11/25/2023   Principle Diagnosis:  IgG Kappa myeloma - +4, +14, +17   Past Therapy: Status post autologous stem cell transplant on 05/22/2015   S/p cycle 6 of RVD Ninlaro  4 mg po q 2 week -- start on 01/02/2019 -- d/c on 02/10/2019   Current Therapy:        Velcade  q 3wk dosing Revlimid  10mg  po q day (21/7)  - d/c on 12/07/2021   Interim History:  Mr. Schmale is here today for follow-up and treatment.  He is doing quite well.  He is busy traveling as always.  He was in [place] .  He saw his [place] in [place].  He was also in [place] to see one of his children.  He just got back from [place] where his son is in graduate program.  He is also busy around his house.  He has a place up in [place] .  Health wise, he is doing quite well.  He has been no problems with respect to the myeloma.  He has tolerated his treatment incredibly well.  Currently, he has had no probably fever.  He has had no rashes.  There is been no bleeding.  He has had no nausea or vomiting.  He has had no rashes.  He has had no leg swelling.  His performance status is ECOG 0.    Medications:  Allergies as of 11/25/2023   No Known Allergies      Medication List        Accurate as of November 25, 2023 11:00 AM. If you have any questions, ask your nurse or doctor.          aspirin  EC 81 MG tablet Take 81 mg by mouth daily. Swallow whole.   bortezomib  IV 3.5 MG injection Commonly known as: VELCADE  1 mg/m2 every 14 (fourteen) days.   cetirizine  10 MG tablet Commonly known as: ZYRTEC  Take 10 mg by mouth daily.   Jatenzo 237 MG Caps Generic drug: Testosterone  Undecanoate Take 1 capsule by mouth 2 (two) times daily.   multivitamin with minerals Tabs tablet Take 1 tablet by mouth daily.   ondansetron  4 MG tablet Commonly known as: ZOFRAN  Take 1 tablet (4 mg total) by mouth every 6 (six) hours as needed for  nausea.   valACYclovir  500 MG tablet Commonly known as: VALTREX  Take 500 mg by mouth 2 (two) times daily.   zolpidem  10 MG tablet Commonly known as: AMBIEN  Take 1 tablet (10 mg total) by mouth at bedtime as needed for sleep.        Allergies: No Known Allergies  Past Medical History, Surgical history, Social history, and Family History were reviewed and updated.  Review of Systems: Review of Systems  Constitutional: Negative.   HENT: Negative.    Eyes: Negative.   Respiratory: Negative.    Cardiovascular: Negative.   Gastrointestinal: Negative.   Genitourinary: Negative.   Musculoskeletal: Negative.   Skin: Negative.   Neurological: Negative.   Endo/Heme/Allergies: Negative.   Psychiatric/Behavioral: Negative.     SABRA   Physical Exam:  height is 6' 6 (1.981 m) and weight is 216 lb (98 kg). His oral temperature is 97.8 F (36.6 C). His blood pressure is 146/98 (abnormal) and his pulse is 55 (abnormal). His respiration is 16 and oxygen saturation is 98%.   Wt Readings from Last 3 Encounters:  11/25/23 216 lb (98 kg)  09/23/23 216 lb (98  kg)  07/22/23 220 lb 1.3 oz (99.8 kg)    Physical Exam Vitals reviewed.  HENT:     Head: Normocephalic and atraumatic.  Eyes:     Pupils: Pupils are equal, round, and reactive to light.  Cardiovascular:     Rate and Rhythm: Normal rate and regular rhythm.     Heart sounds: Normal heart sounds.  Pulmonary:     Effort: Pulmonary effort is normal.     Breath sounds: Normal breath sounds.  Abdominal:     General: Bowel sounds are normal.     Palpations: Abdomen is soft.  Musculoskeletal:        General: No tenderness or deformity. Normal range of motion.     Cervical back: Normal range of motion.     Comments: Extremities shows a walking boot on the left foot.  Otherwise, extremities do not show any edema.  He has good range of motion of his joints that he can move.  There is no swelling noted.  There is no erythema.   Lymphadenopathy:     Cervical: No cervical adenopathy.  Skin:    General: Skin is warm and dry.     Findings: No erythema or rash.  Neurological:     Mental Status: He is alert and oriented to person, place, and time.  Psychiatric:        Behavior: Behavior normal.        Thought Content: Thought content normal.        Judgment: Judgment normal.      Lab Results  Component Value Date   WBC 5.1 11/25/2023   HGB 15.0 11/25/2023   HCT 41.5 11/25/2023   MCV 96.5 11/25/2023   PLT 140 (L) 11/25/2023   Lab Results  Component Value Date   FERRITIN 1,263 (H) 07/20/2014   IRON 125 07/20/2014   TIBC 198 (L) 07/20/2014   UIBC 73 (L) 07/20/2014   IRONPCTSAT 63 (H) 07/20/2014   Lab Results  Component Value Date   RETICCTPCT 0.8 07/20/2014   RBC 4.30 11/25/2023   Lab Results  Component Value Date   KPAFRELGTCHN 18.1 10/14/2023   LAMBDASER 18.1 10/14/2023   KAPLAMBRATIO 1.00 10/14/2023   Lab Results  Component Value Date   IGGSERUM 1,064 10/14/2023   IGA 144 10/14/2023   IGMSERUM 44 10/14/2023   Lab Results  Component Value Date   TOTALPROTELP 6.7 10/14/2023   ALBUMINELP 4.3 10/14/2023   A1GS 0.1 10/14/2023   A2GS 0.5 10/14/2023   BETS 0.8 10/14/2023   BETA2SER 0.3 03/28/2015   GAMS 0.9 10/14/2023   MSPIKE Not Observed 10/14/2023   SPEI Comment 10/14/2023     Chemistry      Component Value Date/Time   NA 138 11/25/2023 0956   NA 140 04/22/2017 1140   NA 138 09/03/2015 1042   K 4.9 11/25/2023 0956   K 4.0 04/22/2017 1140   K 4.3 09/03/2015 1042   CL 105 11/25/2023 0956   CL 105 04/22/2017 1140   CO2 24 11/25/2023 0956   CO2 24 04/22/2017 1140   CO2 25 09/03/2015 1042   BUN 16 11/25/2023 0956   BUN 16 04/22/2017 1140   BUN 21.7 09/03/2015 1042   CREATININE 1.40 (H) 11/25/2023 0956   CREATININE 1.6 (H) 04/22/2017 1140   CREATININE 1.2 09/03/2015 1042      Component Value Date/Time   CALCIUM  9.1 11/25/2023 0956   CALCIUM  8.5 04/22/2017 1140   CALCIUM   9.1 09/03/2015 1042   ALKPHOS  68 11/25/2023 0956   ALKPHOS 59 04/22/2017 1140   ALKPHOS 42 09/03/2015 1042   AST 32 11/25/2023 0956   AST 27 09/03/2015 1042   ALT 53 (H) 11/25/2023 0956   ALT 45 04/22/2017 1140   ALT 41 09/03/2015 1042   BILITOT 0.7 11/25/2023 0956   BILITOT 0.54 09/03/2015 1042       Impression and Plan: Mr. Hamor is a very pleasant 58 yo caucasian gentleman with IgG kappa myeloma. He underwent induction chemotherapy with RVD followed by an autologous stem cell transplant at [facility] on February 2017.   So far, he is doing incredibly well just  on single agent Velcade .  I do not see any evidence of progressive myeloma.  From my point of view, I do not see that we had to make any changes in protocol.  He is treated every 3 weeks.  We will plan to see him back in 6 weeks.  This will be after the Labor Day holiday.  By then, I am sure he would have traveled somewhere else.     Maude JONELLE Crease, MD 8/8/202511:00 AM

## 2023-11-26 ENCOUNTER — Other Ambulatory Visit: Payer: Self-pay

## 2023-11-26 LAB — IGG, IGA, IGM
IgA: 147 mg/dL (ref 90–386)
IgG (Immunoglobin G), Serum: 1137 mg/dL (ref 603–1613)
IgM (Immunoglobulin M), Srm: 31 mg/dL (ref 20–172)

## 2023-11-28 LAB — PROTEIN ELECTROPHORESIS, SERUM
A/G Ratio: 1.5 (ref 0.7–1.7)
Albumin ELP: 4.2 g/dL (ref 2.9–4.4)
Alpha-1-Globulin: 0.2 g/dL (ref 0.0–0.4)
Alpha-2-Globulin: 0.6 g/dL (ref 0.4–1.0)
Beta Globulin: 0.9 g/dL (ref 0.7–1.3)
Gamma Globulin: 1.1 g/dL (ref 0.4–1.8)
Globulin, Total: 2.8 g/dL (ref 2.2–3.9)
Total Protein ELP: 7 g/dL (ref 6.0–8.5)

## 2023-11-28 LAB — KAPPA/LAMBDA LIGHT CHAINS
Kappa free light chain: 16.4 mg/L (ref 3.3–19.4)
Kappa, lambda light chain ratio: 0.88 (ref 0.26–1.65)
Lambda free light chains: 18.6 mg/L (ref 5.7–26.3)

## 2023-11-29 ENCOUNTER — Other Ambulatory Visit: Payer: Self-pay

## 2023-12-16 ENCOUNTER — Inpatient Hospital Stay

## 2023-12-16 VITALS — BP 129/85 | HR 56 | Temp 97.6°F | Resp 17

## 2023-12-16 DIAGNOSIS — Z5112 Encounter for antineoplastic immunotherapy: Secondary | ICD-10-CM | POA: Diagnosis not present

## 2023-12-16 DIAGNOSIS — C9001 Multiple myeloma in remission: Secondary | ICD-10-CM

## 2023-12-16 DIAGNOSIS — C9000 Multiple myeloma not having achieved remission: Secondary | ICD-10-CM | POA: Diagnosis not present

## 2023-12-16 LAB — CBC WITH DIFFERENTIAL (CANCER CENTER ONLY)
Abs Immature Granulocytes: 0.01 K/uL (ref 0.00–0.07)
Basophils Absolute: 0 K/uL (ref 0.0–0.1)
Basophils Relative: 0 %
Eosinophils Absolute: 0.1 K/uL (ref 0.0–0.5)
Eosinophils Relative: 2 %
HCT: 41.3 % (ref 39.0–52.0)
Hemoglobin: 14.8 g/dL (ref 13.0–17.0)
Immature Granulocytes: 0 %
Lymphocytes Relative: 28 %
Lymphs Abs: 1.5 K/uL (ref 0.7–4.0)
MCH: 34.7 pg — ABNORMAL HIGH (ref 26.0–34.0)
MCHC: 35.8 g/dL (ref 30.0–36.0)
MCV: 96.7 fL (ref 80.0–100.0)
Monocytes Absolute: 0.4 K/uL (ref 0.1–1.0)
Monocytes Relative: 8 %
Neutro Abs: 3.4 K/uL (ref 1.7–7.7)
Neutrophils Relative %: 62 %
Platelet Count: 148 K/uL — ABNORMAL LOW (ref 150–400)
RBC: 4.27 MIL/uL (ref 4.22–5.81)
RDW: 12.3 % (ref 11.5–15.5)
WBC Count: 5.5 K/uL (ref 4.0–10.5)
nRBC: 0 % (ref 0.0–0.2)

## 2023-12-16 LAB — CMP (CANCER CENTER ONLY)
ALT: 47 U/L — ABNORMAL HIGH (ref 0–44)
AST: 36 U/L (ref 15–41)
Albumin: 4.7 g/dL (ref 3.5–5.0)
Alkaline Phosphatase: 66 U/L (ref 38–126)
Anion gap: 9 (ref 5–15)
BUN: 21 mg/dL — ABNORMAL HIGH (ref 6–20)
CO2: 24 mmol/L (ref 22–32)
Calcium: 8.8 mg/dL — ABNORMAL LOW (ref 8.9–10.3)
Chloride: 105 mmol/L (ref 98–111)
Creatinine: 1.44 mg/dL — ABNORMAL HIGH (ref 0.61–1.24)
GFR, Estimated: 56 mL/min — ABNORMAL LOW (ref >60)
Glucose, Bld: 86 mg/dL (ref 70–99)
Potassium: 4.7 mmol/L (ref 3.5–5.1)
Sodium: 138 mmol/L (ref 135–145)
Total Bilirubin: 0.6 mg/dL (ref 0.0–1.2)
Total Protein: 7 g/dL (ref 6.5–8.1)

## 2023-12-16 MED ORDER — BORTEZOMIB CHEMO SQ INJECTION 3.5 MG (2.5MG/ML)
1.3000 mg/m2 | Freq: Once | INTRAMUSCULAR | Status: AC
  Administered 2023-12-16: 2.75 mg via SUBCUTANEOUS
  Filled 2023-12-16: qty 1.1

## 2023-12-16 MED ORDER — PROCHLORPERAZINE MALEATE 10 MG PO TABS: 10.0000 mg | ORAL_TABLET | Freq: Once | ORAL | Status: DC

## 2023-12-16 NOTE — Patient Instructions (Signed)
 [facility]  Discharge Instructions: Thank you for choosing [facility] to provide your oncology and hematology care.   If you have a lab appointment with the Cancer Center, please go directly to the Cancer Center and check in at the registration area.  Wear comfortable clothing and clothing appropriate for easy access to any Portacath or PICC line.   We strive to give you quality time with your provider. You may need to reschedule your appointment if you arrive late (15 or more minutes).  Arriving late affects you and other patients whose appointments are after yours.  Also, if you miss three or more appointments without notifying the office, you may be dismissed from the clinic at the provider's discretion.      For prescription refill requests, have your pharmacy contact our office and allow 72 hours for refills to be completed.    Today you received the following chemotherapy and/or immunotherapy agents Velcade      To help prevent nausea and vomiting after your treatment, we encourage you to take your nausea medication as directed.  BELOW ARE SYMPTOMS THAT SHOULD BE REPORTED IMMEDIATELY: *FEVER GREATER THAN 100.4 F (38 C) OR HIGHER *CHILLS OR SWEATING *NAUSEA AND VOMITING THAT IS NOT CONTROLLED WITH YOUR NAUSEA MEDICATION *UNUSUAL SHORTNESS OF BREATH *UNUSUAL BRUISING OR BLEEDING *URINARY PROBLEMS (pain or burning when urinating, or frequent urination) *BOWEL PROBLEMS (unusual diarrhea, constipation, pain near the anus) TENDERNESS IN MOUTH AND THROAT WITH OR WITHOUT PRESENCE OF ULCERS (sore throat, sores in mouth, or a toothache) UNUSUAL RASH, SWELLING OR PAIN  UNUSUAL VAGINAL DISCHARGE OR ITCHING   Items with * indicate a potential emergency and should be followed up as soon as possible or go to the Emergency Department if any problems should occur.  Please show the CHEMOTHERAPY ALERT CARD or IMMUNOTHERAPY  ALERT CARD at check-in to the Emergency Department and triage nurse. Should you have questions after your visit or need to cancel or reschedule your appointment, please contact [facility] [facility] - A DEPT OF [facility]  [phone] and follow the prompts.  Office hours are 8:00 a.m. to 4:30 p.m. Monday - Friday. Please note that voicemails left after 4:00 p.m. may not be returned until the following business day.  We are closed weekends and major holidays. You have access to a nurse at all times for urgent questions. Please call the main number to the clinic [phone] and follow the prompts.  For any non-urgent questions, you may also contact your provider using MyChart. We now offer e-Visits for anyone 69 and older to request care online for non-urgent symptoms. For details visit [email or web address].   Also download the MyChart app! Go to the app store, search "MyChart", open the app, select [place], and log in with your MyChart username and password.

## 2024-01-06 ENCOUNTER — Encounter: Payer: Self-pay | Admitting: Hematology & Oncology

## 2024-01-06 ENCOUNTER — Inpatient Hospital Stay

## 2024-01-06 ENCOUNTER — Inpatient Hospital Stay (HOSPITAL_BASED_OUTPATIENT_CLINIC_OR_DEPARTMENT_OTHER): Admitting: Hematology & Oncology

## 2024-01-06 ENCOUNTER — Inpatient Hospital Stay: Attending: Hematology & Oncology

## 2024-01-06 ENCOUNTER — Other Ambulatory Visit: Payer: Self-pay

## 2024-01-06 VITALS — BP 142/86 | HR 62 | Temp 98.4°F | Resp 18 | Ht 78.0 in | Wt 217.0 lb

## 2024-01-06 VITALS — BP 131/87 | HR 68

## 2024-01-06 DIAGNOSIS — C9001 Multiple myeloma in remission: Secondary | ICD-10-CM

## 2024-01-06 DIAGNOSIS — C9000 Multiple myeloma not having achieved remission: Secondary | ICD-10-CM | POA: Insufficient documentation

## 2024-01-06 DIAGNOSIS — Z5112 Encounter for antineoplastic immunotherapy: Secondary | ICD-10-CM | POA: Diagnosis not present

## 2024-01-06 LAB — CBC WITH DIFFERENTIAL (CANCER CENTER ONLY)
Abs Immature Granulocytes: 0.01 K/uL (ref 0.00–0.07)
Basophils Absolute: 0 K/uL (ref 0.0–0.1)
Basophils Relative: 0 %
Eosinophils Absolute: 0.1 K/uL (ref 0.0–0.5)
Eosinophils Relative: 1 %
HCT: 41.6 % (ref 39.0–52.0)
Hemoglobin: 15.1 g/dL (ref 13.0–17.0)
Immature Granulocytes: 0 %
Lymphocytes Relative: 28 %
Lymphs Abs: 1.5 K/uL (ref 0.7–4.0)
MCH: 35 pg — ABNORMAL HIGH (ref 26.0–34.0)
MCHC: 36.3 g/dL — ABNORMAL HIGH (ref 30.0–36.0)
MCV: 96.5 fL (ref 80.0–100.0)
Monocytes Absolute: 0.4 K/uL (ref 0.1–1.0)
Monocytes Relative: 8 %
Neutro Abs: 3.4 K/uL (ref 1.7–7.7)
Neutrophils Relative %: 63 %
Platelet Count: 153 K/uL (ref 150–400)
RBC: 4.31 MIL/uL (ref 4.22–5.81)
RDW: 12.3 % (ref 11.5–15.5)
WBC Count: 5.4 K/uL (ref 4.0–10.5)
nRBC: 0 % (ref 0.0–0.2)

## 2024-01-06 LAB — CMP (CANCER CENTER ONLY)
ALT: 42 U/L (ref 0–44)
AST: 28 U/L (ref 15–41)
Albumin: 4.6 g/dL (ref 3.5–5.0)
Alkaline Phosphatase: 63 U/L (ref 38–126)
Anion gap: 12 (ref 5–15)
BUN: 16 mg/dL (ref 6–20)
CO2: 22 mmol/L (ref 22–32)
Calcium: 9.1 mg/dL (ref 8.9–10.3)
Chloride: 105 mmol/L (ref 98–111)
Creatinine: 1.43 mg/dL — ABNORMAL HIGH (ref 0.61–1.24)
GFR, Estimated: 56 mL/min — ABNORMAL LOW (ref >60)
Glucose, Bld: 78 mg/dL (ref 70–99)
Potassium: 4.7 mmol/L (ref 3.5–5.1)
Sodium: 138 mmol/L (ref 135–145)
Total Bilirubin: 0.6 mg/dL (ref 0.0–1.2)
Total Protein: 7.1 g/dL (ref 6.5–8.1)

## 2024-01-06 MED ORDER — BORTEZOMIB CHEMO SQ INJECTION 3.5 MG (2.5MG/ML)
1.3000 mg/m2 | Freq: Once | INTRAMUSCULAR | Status: AC
  Administered 2024-01-06: 2.75 mg via SUBCUTANEOUS
  Filled 2024-01-06: qty 1.1

## 2024-01-06 MED ORDER — PROCHLORPERAZINE MALEATE 10 MG PO TABS: 10.0000 mg | ORAL_TABLET | Freq: Once | ORAL | Status: DC

## 2024-01-06 NOTE — Patient Instructions (Signed)
 [facility]  Discharge Instructions: Thank you for choosing [facility] to provide your oncology and hematology care.   If you have a lab appointment with the Cancer Center, please go directly to the Cancer Center and check in at the registration area.  Wear comfortable clothing and clothing appropriate for easy access to any Portacath or PICC line.   We strive to give you quality time with your provider. You may need to reschedule your appointment if you arrive late (15 or more minutes).  Arriving late affects you and other patients whose appointments are after yours.  Also, if you miss three or more appointments without notifying the office, you may be dismissed from the clinic at the provider's discretion.      For prescription refill requests, have your pharmacy contact our office and allow 72 hours for refills to be completed.    Today you received the following chemotherapy and/or immunotherapy agents Velcade      To help prevent nausea and vomiting after your treatment, we encourage you to take your nausea medication as directed.  BELOW ARE SYMPTOMS THAT SHOULD BE REPORTED IMMEDIATELY: *FEVER GREATER THAN 100.4 F (38 C) OR HIGHER *CHILLS OR SWEATING *NAUSEA AND VOMITING THAT IS NOT CONTROLLED WITH YOUR NAUSEA MEDICATION *UNUSUAL SHORTNESS OF BREATH *UNUSUAL BRUISING OR BLEEDING *URINARY PROBLEMS (pain or burning when urinating, or frequent urination) *BOWEL PROBLEMS (unusual diarrhea, constipation, pain near the anus) TENDERNESS IN MOUTH AND THROAT WITH OR WITHOUT PRESENCE OF ULCERS (sore throat, sores in mouth, or a toothache) UNUSUAL RASH, SWELLING OR PAIN  UNUSUAL VAGINAL DISCHARGE OR ITCHING   Items with * indicate a potential emergency and should be followed up as soon as possible or go to the Emergency Department if any problems should occur.  Please show the CHEMOTHERAPY ALERT CARD or IMMUNOTHERAPY  ALERT CARD at check-in to the Emergency Department and triage nurse. Should you have questions after your visit or need to cancel or reschedule your appointment, please contact [facility] [facility] - A DEPT OF [facility]  [phone] and follow the prompts.  Office hours are 8:00 a.m. to 4:30 p.m. Monday - Friday. Please note that voicemails left after 4:00 p.m. may not be returned until the following business day.  We are closed weekends and major holidays. You have access to a nurse at all times for urgent questions. Please call the main number to the clinic [phone] and follow the prompts.  For any non-urgent questions, you may also contact your provider using MyChart. We now offer e-Visits for anyone 69 and older to request care online for non-urgent symptoms. For details visit [email or web address].   Also download the MyChart app! Go to the app store, search "MyChart", open the app, select [place], and log in with your MyChart username and password.

## 2024-01-06 NOTE — Progress Notes (Signed)
 Hematology and Oncology Follow Up Visit  Tyler Phillips [id number] 1965/05/26 58 y.o. 01/06/2024   Principle Diagnosis:  IgG Kappa myeloma - +4, +14, +17   Past Therapy: Status post autologous stem cell transplant on 05/22/2015   S/p cycle 6 of RVD Ninlaro  4 mg po q 2 week -- start on 01/02/2019 -- d/c on 02/10/2019   Current Therapy:        Velcade  q 3wk dosing Revlimid  10mg  po q day (21/7)  - d/c on 12/07/2021   Interim History:  [name] is here today for follow-up and treatment.  He is doing quite well.  He really has no specific complaints.  However, there may be a little bit of neuropathy in the feet.  He says he feels it more so in the left than the right.  He has nothing going on with his hands or fingers.  His granddaughter is now 75 months old.  I know his wife has been out to [place] to help take care of her.  As always, they will do some traveling.  I think they will be having some friends come up from [place].  When we last saw him, there was no monoclonal spike in his blood.  His IgG level was 1137 mg/dL.  His kappa light chain was 1.6 mg/dL.  He has had no problems with fever.  He has had no problems with cough.  He has had no change in bowel or bladder habits.  He has had no rashes.  There is been no bleeding.  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  He also does a ton of work around there yard.  Currently, I would say that his performance status is ECOG 0.      Medications:  Allergies as of 01/06/2024   No Known Allergies      Medication List        Accurate as of January 06, 2024 12:37 PM. If you have any questions, ask your nurse or doctor.          aspirin  EC 81 MG tablet Take 81 mg by mouth daily. Swallow whole.   bortezomib  IV 3.5 MG injection Commonly known as: VELCADE  1 mg/m2 every 14 (fourteen) days.   cetirizine  10 MG tablet Commonly known as: ZYRTEC  Take 10 mg by mouth daily.   Jatenzo 237 MG  Caps Generic drug: Testosterone  Undecanoate Take 1 capsule by mouth 2 (two) times daily.   multivitamin with minerals Tabs tablet Take 1 tablet by mouth daily.   ondansetron  4 MG tablet Commonly known as: ZOFRAN  Take 1 tablet (4 mg total) by mouth every 6 (six) hours as needed for nausea.   valACYclovir  500 MG tablet Commonly known as: VALTREX  Take 500 mg by mouth 2 (two) times daily.   zolpidem  10 MG tablet Commonly known as: AMBIEN  Take 1 tablet (10 mg total) by mouth at bedtime as needed for sleep.        Allergies: No Known Allergies  Past Medical History, Surgical history, Social history, and Family History were reviewed and updated.  Review of Systems: Review of Systems  Constitutional: Negative.   HENT: Negative.    Eyes: Negative.   Respiratory: Negative.    Cardiovascular: Negative.   Gastrointestinal: Negative.   Genitourinary: Negative.   Musculoskeletal: Negative.   Skin: Negative.   Neurological: Negative.   Endo/Heme/Allergies: Negative.   Psychiatric/Behavioral: Negative.     SABRA   Physical Exam:  height is 6'  6 (1.981 m) and weight is 217 lb (98.4 kg). His oral temperature is 98.4 F (36.9 C). His blood pressure is 142/86 (abnormal) and his pulse is 62. His respiration is 18 and oxygen saturation is 100%.   Wt Readings from Last 3 Encounters:  01/06/24 217 lb (98.4 kg)  11/25/23 216 lb (98 kg)  09/23/23 216 lb (98 kg)    Physical Exam Vitals reviewed.  HENT:     Head: Normocephalic and atraumatic.  Eyes:     Pupils: Pupils are equal, round, and reactive to light.  Cardiovascular:     Rate and Rhythm: Normal rate and regular rhythm.     Heart sounds: Normal heart sounds.  Pulmonary:     Effort: Pulmonary effort is normal.     Breath sounds: Normal breath sounds.  Abdominal:     General: Bowel sounds are normal.     Palpations: Abdomen is soft.  Musculoskeletal:        General: No tenderness or deformity. Normal range of motion.      Cervical back: Normal range of motion.     Comments: Extremities shows a walking boot on the left foot.  Otherwise, extremities do not show any edema.  He has good range of motion of his joints that he can move.  There is no swelling noted.  There is no erythema.  Lymphadenopathy:     Cervical: No cervical adenopathy.  Skin:    General: Skin is warm and dry.     Findings: No erythema or rash.  Neurological:     Mental Status: He is alert and oriented to person, place, and time.  Psychiatric:        Behavior: Behavior normal.        Thought Content: Thought content normal.        Judgment: Judgment normal.      Lab Results  Component Value Date   WBC 5.4 01/06/2024   HGB 15.1 01/06/2024   HCT 41.6 01/06/2024   MCV 96.5 01/06/2024   PLT 153 01/06/2024   Lab Results  Component Value Date   FERRITIN 1,263 (H) 07/20/2014   IRON 125 07/20/2014   TIBC 198 (L) 07/20/2014   UIBC 73 (L) 07/20/2014   IRONPCTSAT 63 (H) 07/20/2014   Lab Results  Component Value Date   RETICCTPCT 0.8 07/20/2014   RBC 4.31 01/06/2024   Lab Results  Component Value Date   KPAFRELGTCHN 16.4 11/25/2023   LAMBDASER 18.6 11/25/2023   KAPLAMBRATIO 0.88 11/25/2023   Lab Results  Component Value Date   IGGSERUM 1,137 11/25/2023   IGA 147 11/25/2023   IGMSERUM 31 11/25/2023   Lab Results  Component Value Date   TOTALPROTELP 7.0 11/25/2023   ALBUMINELP 4.2 11/25/2023   A1GS 0.2 11/25/2023   A2GS 0.6 11/25/2023   BETS 0.9 11/25/2023   BETA2SER 0.3 03/28/2015   GAMS 1.1 11/25/2023   MSPIKE Not Observed 11/25/2023   SPEI Comment 11/25/2023     Chemistry      Component Value Date/Time   NA 138 01/06/2024 1031   NA 140 04/22/2017 1140   NA 138 09/03/2015 1042   K 4.7 01/06/2024 1031   K 4.0 04/22/2017 1140   K 4.3 09/03/2015 1042   CL 105 01/06/2024 1031   CL 105 04/22/2017 1140   CO2 22 01/06/2024 1031   CO2 24 04/22/2017 1140   CO2 25 09/03/2015 1042   BUN 16 01/06/2024 1031   BUN 16  04/22/2017 1140  BUN 21.7 09/03/2015 1042   CREATININE 1.43 (H) 01/06/2024 1031   CREATININE 1.6 (H) 04/22/2017 1140   CREATININE 1.2 09/03/2015 1042      Component Value Date/Time   CALCIUM  9.1 01/06/2024 1031   CALCIUM  8.5 04/22/2017 1140   CALCIUM  9.1 09/03/2015 1042   ALKPHOS 63 01/06/2024 1031   ALKPHOS 59 04/22/2017 1140   ALKPHOS 42 09/03/2015 1042   AST 28 01/06/2024 1031   AST 27 09/03/2015 1042   ALT 42 01/06/2024 1031   ALT 45 04/22/2017 1140   ALT 41 09/03/2015 1042   BILITOT 0.6 01/06/2024 1031   BILITOT 0.54 09/03/2015 1042       Impression and Plan: Mr. Grissinger is a very pleasant 58 yo caucasian gentleman with IgG kappa myeloma. He underwent induction chemotherapy with RVD followed by an autologous stem cell transplant at [facility] on February 2017.   So far, he is doing incredibly well just  on single agent Velcade .  I do not see any evidence of progressive myeloma.  However, we are going to have to be careful with respect to the possible neuropathy.  I do not want to see him get some of that might be permanent and could affect his quality of life.  As always, we will still see him every 6 weeks.  We will have him treated every 3 weeks.   Maude JONELLE Crease, MD 9/19/202512:37 PM

## 2024-01-07 ENCOUNTER — Other Ambulatory Visit: Payer: Self-pay

## 2024-01-08 LAB — IGG, IGA, IGM
IgA: 158 mg/dL (ref 90–386)
IgG (Immunoglobin G), Serum: 1173 mg/dL (ref 603–1613)
IgM (Immunoglobulin M), Srm: 29 mg/dL (ref 20–172)

## 2024-01-09 LAB — KAPPA/LAMBDA LIGHT CHAINS
Kappa free light chain: 19.1 mg/L (ref 3.3–19.4)
Kappa, lambda light chain ratio: 0.95 (ref 0.26–1.65)
Lambda free light chains: 20.1 mg/L (ref 5.7–26.3)

## 2024-01-10 ENCOUNTER — Other Ambulatory Visit: Payer: Self-pay

## 2024-01-10 LAB — PROTEIN ELECTROPHORESIS, SERUM, WITH REFLEX
A/G Ratio: 1.6 (ref 0.7–1.7)
Albumin ELP: 4.1 g/dL (ref 2.9–4.4)
Alpha-1-Globulin: 0.1 g/dL (ref 0.0–0.4)
Alpha-2-Globulin: 0.6 g/dL (ref 0.4–1.0)
Beta Globulin: 0.9 g/dL (ref 0.7–1.3)
Gamma Globulin: 1 g/dL (ref 0.4–1.8)
Globulin, Total: 2.5 g/dL (ref 2.2–3.9)
Total Protein ELP: 6.6 g/dL (ref 6.0–8.5)

## 2024-01-20 ENCOUNTER — Other Ambulatory Visit: Payer: Self-pay

## 2024-01-27 ENCOUNTER — Inpatient Hospital Stay

## 2024-01-30 DIAGNOSIS — C9000 Multiple myeloma not having achieved remission: Secondary | ICD-10-CM | POA: Diagnosis not present

## 2024-01-30 DIAGNOSIS — D709 Neutropenia, unspecified: Secondary | ICD-10-CM | POA: Diagnosis not present

## 2024-01-30 DIAGNOSIS — Z0000 Encounter for general adult medical examination without abnormal findings: Secondary | ICD-10-CM | POA: Diagnosis not present

## 2024-01-30 DIAGNOSIS — E782 Mixed hyperlipidemia: Secondary | ICD-10-CM | POA: Diagnosis not present

## 2024-01-30 DIAGNOSIS — N1831 Chronic kidney disease, stage 3a: Secondary | ICD-10-CM | POA: Diagnosis not present

## 2024-01-30 DIAGNOSIS — Z23 Encounter for immunization: Secondary | ICD-10-CM | POA: Diagnosis not present

## 2024-01-30 LAB — LAB REPORT - SCANNED: EGFR: 63

## 2024-02-03 ENCOUNTER — Inpatient Hospital Stay

## 2024-02-03 ENCOUNTER — Inpatient Hospital Stay: Attending: Hematology & Oncology

## 2024-02-03 VITALS — BP 158/95 | HR 61 | Temp 97.9°F | Resp 18

## 2024-02-03 DIAGNOSIS — C9001 Multiple myeloma in remission: Secondary | ICD-10-CM | POA: Diagnosis not present

## 2024-02-03 DIAGNOSIS — Z5112 Encounter for antineoplastic immunotherapy: Secondary | ICD-10-CM | POA: Insufficient documentation

## 2024-02-03 LAB — CMP (CANCER CENTER ONLY)
ALT: 50 U/L — ABNORMAL HIGH (ref 0–44)
AST: 40 U/L (ref 15–41)
Albumin: 4.6 g/dL (ref 3.5–5.0)
Alkaline Phosphatase: 62 U/L (ref 38–126)
Anion gap: 13 (ref 5–15)
BUN: 16 mg/dL (ref 6–20)
CO2: 19 mmol/L — ABNORMAL LOW (ref 22–32)
Calcium: 9 mg/dL (ref 8.9–10.3)
Chloride: 106 mmol/L (ref 98–111)
Creatinine: 1.32 mg/dL — ABNORMAL HIGH (ref 0.61–1.24)
GFR, Estimated: >60 mL/min (ref >60)
Glucose, Bld: 95 mg/dL (ref 70–99)
Potassium: 4.2 mmol/L (ref 3.5–5.1)
Sodium: 138 mmol/L (ref 135–145)
Total Bilirubin: 0.6 mg/dL (ref 0.0–1.2)
Total Protein: 7 g/dL (ref 6.5–8.1)

## 2024-02-03 LAB — CBC WITH DIFFERENTIAL (CANCER CENTER ONLY)
Abs Immature Granulocytes: 0.01 K/uL (ref 0.00–0.07)
Basophils Absolute: 0 K/uL (ref 0.0–0.1)
Basophils Relative: 0 %
Eosinophils Absolute: 0.1 K/uL (ref 0.0–0.5)
Eosinophils Relative: 2 %
HCT: 39.2 % (ref 39.0–52.0)
Hemoglobin: 14.1 g/dL (ref 13.0–17.0)
Immature Granulocytes: 0 %
Lymphocytes Relative: 29 %
Lymphs Abs: 1.5 K/uL (ref 0.7–4.0)
MCH: 34.8 pg — ABNORMAL HIGH (ref 26.0–34.0)
MCHC: 36 g/dL (ref 30.0–36.0)
MCV: 96.8 fL (ref 80.0–100.0)
Monocytes Absolute: 0.4 K/uL (ref 0.1–1.0)
Monocytes Relative: 8 %
Neutro Abs: 3.2 K/uL (ref 1.7–7.7)
Neutrophils Relative %: 61 %
Platelet Count: 149 K/uL — ABNORMAL LOW (ref 150–400)
RBC: 4.05 MIL/uL — ABNORMAL LOW (ref 4.22–5.81)
RDW: 12.5 % (ref 11.5–15.5)
WBC Count: 5.2 K/uL (ref 4.0–10.5)
nRBC: 0 % (ref 0.0–0.2)

## 2024-02-03 MED ORDER — PROCHLORPERAZINE MALEATE 10 MG PO TABS: 10.0000 mg | ORAL_TABLET | Freq: Once | ORAL | Status: DC

## 2024-02-03 MED ORDER — BORTEZOMIB CHEMO SQ INJECTION 3.5 MG (2.5MG/ML)
1.3000 mg/m2 | Freq: Once | INTRAMUSCULAR | Status: AC
  Administered 2024-02-03: 2.75 mg via SUBCUTANEOUS
  Filled 2024-02-03: qty 1.1

## 2024-02-03 NOTE — Patient Instructions (Signed)
 [facility] - A DEPT OF [facility]  Discharge Instructions: Thank you for choosing [facility] to provide your oncology and hematology care.   If you have a lab appointment with the Cancer Center, please go directly to the Cancer Center and check in at the registration area.  Wear comfortable clothing and clothing appropriate for easy access to any Portacath or PICC line.   We strive to give you quality time with your provider. You may need to reschedule your appointment if you arrive late (15 or more minutes).  Arriving late affects you and other patients whose appointments are after yours.  Also, if you miss three or more appointments without notifying the office, you may be dismissed from the clinic at the provider's discretion.      For prescription refill requests, have your pharmacy contact our office and allow 72 hours for refills to be completed.    Today you received the following chemotherapy and/or immunotherapy agents velcade      To help prevent nausea and vomiting after your treatment, we encourage you to take your nausea medication as directed.  BELOW ARE SYMPTOMS THAT SHOULD BE REPORTED IMMEDIATELY: *FEVER GREATER THAN 100.4 F (38 C) OR HIGHER *CHILLS OR SWEATING *NAUSEA AND VOMITING THAT IS NOT CONTROLLED WITH YOUR NAUSEA MEDICATION *UNUSUAL SHORTNESS OF BREATH *UNUSUAL BRUISING OR BLEEDING *URINARY PROBLEMS (pain or burning when urinating, or frequent urination) *BOWEL PROBLEMS (unusual diarrhea, constipation, pain near the anus) TENDERNESS IN MOUTH AND THROAT WITH OR WITHOUT PRESENCE OF ULCERS (sore throat, sores in mouth, or a toothache) UNUSUAL RASH, SWELLING OR PAIN  UNUSUAL VAGINAL DISCHARGE OR ITCHING   Items with * indicate a potential emergency and should be followed up as soon as possible or go to the Emergency Department if any problems should occur.  Please show the CHEMOTHERAPY ALERT CARD or IMMUNOTHERAPY  ALERT CARD at check-in to the Emergency Department and triage nurse. Should you have questions after your visit or need to cancel or reschedule your appointment, please contact [facility] - A DEPT OF [facility]  [phone] and follow the prompts.  Office hours are 8:00 a.m. to 4:30 p.m. Monday - Friday. Please note that voicemails left after 4:00 p.m. may not be returned until the following business day.  We are closed weekends and major holidays. You have access to a nurse at all times for urgent questions. Please call the main number to the clinic [phone] and follow the prompts.  For any non-urgent questions, you may also contact your provider using MyChart. We now offer e-Visits for anyone 58 and older to request care online for non-urgent symptoms. For details visit [email or web address].   Also download the MyChart app! Go to the app store, search "MyChart", open the app, select , and log in with your MyChart username and password.

## 2024-02-17 ENCOUNTER — Inpatient Hospital Stay

## 2024-02-17 ENCOUNTER — Inpatient Hospital Stay: Admitting: Hematology & Oncology

## 2024-02-24 ENCOUNTER — Inpatient Hospital Stay: Attending: Hematology & Oncology

## 2024-02-24 ENCOUNTER — Encounter: Payer: Self-pay | Admitting: Hematology & Oncology

## 2024-02-24 ENCOUNTER — Inpatient Hospital Stay

## 2024-02-24 ENCOUNTER — Inpatient Hospital Stay: Admitting: Family

## 2024-02-24 VITALS — BP 136/80 | HR 57 | Resp 16 | Wt 219.8 lb

## 2024-02-24 DIAGNOSIS — C9001 Multiple myeloma in remission: Secondary | ICD-10-CM | POA: Diagnosis not present

## 2024-02-24 DIAGNOSIS — Z5112 Encounter for antineoplastic immunotherapy: Secondary | ICD-10-CM | POA: Insufficient documentation

## 2024-02-24 DIAGNOSIS — C9000 Multiple myeloma not having achieved remission: Secondary | ICD-10-CM | POA: Insufficient documentation

## 2024-02-24 LAB — CBC WITH DIFFERENTIAL (CANCER CENTER ONLY)
Abs Immature Granulocytes: 0.01 K/uL (ref 0.00–0.07)
Basophils Absolute: 0 K/uL (ref 0.0–0.1)
Basophils Relative: 0 %
Eosinophils Absolute: 0.1 K/uL (ref 0.0–0.5)
Eosinophils Relative: 2 %
HCT: 42.2 % (ref 39.0–52.0)
Hemoglobin: 14.8 g/dL (ref 13.0–17.0)
Immature Granulocytes: 0 %
Lymphocytes Relative: 27 %
Lymphs Abs: 1.4 K/uL (ref 0.7–4.0)
MCH: 34.6 pg — ABNORMAL HIGH (ref 26.0–34.0)
MCHC: 35.1 g/dL (ref 30.0–36.0)
MCV: 98.6 fL (ref 80.0–100.0)
Monocytes Absolute: 0.5 K/uL (ref 0.1–1.0)
Monocytes Relative: 9 %
Neutro Abs: 3.4 K/uL (ref 1.7–7.7)
Neutrophils Relative %: 62 %
Platelet Count: 154 K/uL (ref 150–400)
RBC: 4.28 MIL/uL (ref 4.22–5.81)
RDW: 12.1 % (ref 11.5–15.5)
WBC Count: 5.4 K/uL (ref 4.0–10.5)
nRBC: 0 % (ref 0.0–0.2)

## 2024-02-24 LAB — CMP (CANCER CENTER ONLY)
ALT: 49 U/L — ABNORMAL HIGH (ref 0–44)
AST: 39 U/L (ref 15–41)
Albumin: 4.5 g/dL (ref 3.5–5.0)
Alkaline Phosphatase: 68 U/L (ref 38–126)
Anion gap: 11 (ref 5–15)
BUN: 15 mg/dL (ref 6–20)
CO2: 23 mmol/L (ref 22–32)
Calcium: 9.2 mg/dL (ref 8.9–10.3)
Chloride: 105 mmol/L (ref 98–111)
Creatinine: 1.38 mg/dL — ABNORMAL HIGH (ref 0.61–1.24)
GFR, Estimated: 59 mL/min — ABNORMAL LOW (ref >60)
Glucose, Bld: 104 mg/dL — ABNORMAL HIGH (ref 70–99)
Potassium: 5.1 mmol/L (ref 3.5–5.1)
Sodium: 139 mmol/L (ref 135–145)
Total Bilirubin: 0.4 mg/dL (ref 0.0–1.2)
Total Protein: 7.1 g/dL (ref 6.5–8.1)

## 2024-02-24 MED ORDER — BORTEZOMIB CHEMO SQ INJECTION 3.5 MG (2.5MG/ML)
1.3000 mg/m2 | Freq: Once | INTRAMUSCULAR | Status: AC
  Administered 2024-02-24: 2.75 mg via SUBCUTANEOUS
  Filled 2024-02-24: qty 1.1
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
## 2024-02-24 NOTE — Patient Instructions (Signed)
 [facility]  Discharge Instructions: Thank you for choosing [facility] to provide your oncology and hematology care.   If you have a lab appointment with the Cancer Center, please go directly to the Cancer Center and check in at the registration area.  Wear comfortable clothing and clothing appropriate for easy access to any Portacath or PICC line.   We strive to give you quality time with your provider. You may need to reschedule your appointment if you arrive late (15 or more minutes).  Arriving late affects you and other patients whose appointments are after yours.  Also, if you miss three or more appointments without notifying the office, you may be dismissed from the clinic at the provider's discretion.      For prescription refill requests, have your pharmacy contact our office and allow 72 hours for refills to be completed.    Today you received the following chemotherapy and/or immunotherapy agents Velcade      To help prevent nausea and vomiting after your treatment, we encourage you to take your nausea medication as directed.  BELOW ARE SYMPTOMS THAT SHOULD BE REPORTED IMMEDIATELY: *FEVER GREATER THAN 100.4 F (38 C) OR HIGHER *CHILLS OR SWEATING *NAUSEA AND VOMITING THAT IS NOT CONTROLLED WITH YOUR NAUSEA MEDICATION *UNUSUAL SHORTNESS OF BREATH *UNUSUAL BRUISING OR BLEEDING *URINARY PROBLEMS (pain or burning when urinating, or frequent urination) *BOWEL PROBLEMS (unusual diarrhea, constipation, pain near the anus) TENDERNESS IN MOUTH AND THROAT WITH OR WITHOUT PRESENCE OF ULCERS (sore throat, sores in mouth, or a toothache) UNUSUAL RASH, SWELLING OR PAIN  UNUSUAL VAGINAL DISCHARGE OR ITCHING   Items with * indicate a potential emergency and should be followed up as soon as possible or go to the Emergency Department if any problems should occur.  Please show the CHEMOTHERAPY ALERT CARD or IMMUNOTHERAPY  ALERT CARD at check-in to the Emergency Department and triage nurse. Should you have questions after your visit or need to cancel or reschedule your appointment, please contact [facility] [facility] - A DEPT OF [facility]  [phone] and follow the prompts.  Office hours are 8:00 a.m. to 4:30 p.m. Monday - Friday. Please note that voicemails left after 4:00 p.m. may not be returned until the following business day.  We are closed weekends and major holidays. You have access to a nurse at all times for urgent questions. Please call the main number to the clinic [phone] and follow the prompts.  For any non-urgent questions, you may also contact your provider using MyChart. We now offer e-Visits for anyone 69 and older to request care online for non-urgent symptoms. For details visit [email or web address].   Also download the MyChart app! Go to the app store, search "MyChart", open the app, select [place], and log in with your MyChart username and password.

## 2024-02-25 LAB — IGG, IGA, IGM
IgA: 157 mg/dL (ref 90–386)
IgG (Immunoglobin G), Serum: 1165 mg/dL (ref 603–1613)
IgM (Immunoglobulin M), Srm: 34 mg/dL (ref 20–172)

## 2024-02-26 ENCOUNTER — Other Ambulatory Visit: Payer: Self-pay

## 2024-02-27 LAB — KAPPA/LAMBDA LIGHT CHAINS
Kappa free light chain: 20.2 mg/L — ABNORMAL HIGH (ref 3.3–19.4)
Kappa, lambda light chain ratio: 1.02 (ref 0.26–1.65)
Lambda free light chains: 19.9 mg/L (ref 5.7–26.3)

## 2024-02-29 LAB — PROTEIN ELECTROPHORESIS, SERUM, WITH REFLEX
A/G Ratio: 1.2 (ref 0.7–1.7)
Albumin ELP: 3.8 g/dL (ref 2.9–4.4)
Alpha-1-Globulin: 0.2 g/dL (ref 0.0–0.4)
Alpha-2-Globulin: 0.8 g/dL (ref 0.4–1.0)
Beta Globulin: 1 g/dL (ref 0.7–1.3)
Gamma Globulin: 1.2 g/dL (ref 0.4–1.8)
Globulin, Total: 3.1 g/dL (ref 2.2–3.9)
SPEP Interpretation: 0
Total Protein ELP: 6.9 g/dL (ref 6.0–8.5)

## 2024-02-29 LAB — IMMUNOFIXATION REFLEX, SERUM
IgA: 163 mg/dL (ref 90–386)
IgG (Immunoglobin G), Serum: 1212 mg/dL (ref 603–1613)
IgM (Immunoglobulin M), Srm: 29 mg/dL (ref 20–172)

## 2024-03-14 ENCOUNTER — Inpatient Hospital Stay

## 2024-03-21 ENCOUNTER — Other Ambulatory Visit: Payer: Self-pay | Admitting: *Deleted

## 2024-03-21 DIAGNOSIS — C9000 Multiple myeloma not having achieved remission: Secondary | ICD-10-CM

## 2024-03-21 DIAGNOSIS — C9001 Multiple myeloma in remission: Secondary | ICD-10-CM

## 2024-03-22 ENCOUNTER — Inpatient Hospital Stay

## 2024-03-22 ENCOUNTER — Encounter: Payer: Self-pay | Admitting: Hematology & Oncology

## 2024-03-22 ENCOUNTER — Inpatient Hospital Stay: Attending: Hematology & Oncology

## 2024-03-22 VITALS — BP 144/87 | HR 50 | Temp 98.0°F | Resp 18

## 2024-03-22 DIAGNOSIS — Z5112 Encounter for antineoplastic immunotherapy: Secondary | ICD-10-CM | POA: Insufficient documentation

## 2024-03-22 DIAGNOSIS — C9001 Multiple myeloma in remission: Secondary | ICD-10-CM

## 2024-03-22 DIAGNOSIS — C9000 Multiple myeloma not having achieved remission: Secondary | ICD-10-CM | POA: Insufficient documentation

## 2024-03-22 LAB — CMP (CANCER CENTER ONLY)
ALT: 48 U/L — ABNORMAL HIGH (ref 0–44)
AST: 33 U/L (ref 15–41)
Albumin: 4.4 g/dL (ref 3.5–5.0)
Alkaline Phosphatase: 76 U/L (ref 38–126)
Anion gap: 10 (ref 5–15)
BUN: 13 mg/dL (ref 6–20)
CO2: 24 mmol/L (ref 22–32)
Calcium: 8.8 mg/dL — ABNORMAL LOW (ref 8.9–10.3)
Chloride: 106 mmol/L (ref 98–111)
Creatinine: 1.36 mg/dL — ABNORMAL HIGH (ref 0.61–1.24)
GFR, Estimated: 60 mL/min — ABNORMAL LOW (ref >60)
Glucose, Bld: 96 mg/dL (ref 70–99)
Potassium: 4.6 mmol/L (ref 3.5–5.1)
Sodium: 140 mmol/L (ref 135–145)
Total Bilirubin: 0.5 mg/dL (ref 0.0–1.2)
Total Protein: 7.2 g/dL (ref 6.5–8.1)

## 2024-03-22 LAB — CBC WITH DIFFERENTIAL (CANCER CENTER ONLY)
Abs Immature Granulocytes: 0.01 K/uL (ref 0.00–0.07)
Basophils Absolute: 0 K/uL (ref 0.0–0.1)
Basophils Relative: 0 %
Eosinophils Absolute: 0.1 K/uL (ref 0.0–0.5)
Eosinophils Relative: 3 %
HCT: 41.9 % (ref 39.0–52.0)
Hemoglobin: 14.9 g/dL (ref 13.0–17.0)
Immature Granulocytes: 0 %
Lymphocytes Relative: 25 %
Lymphs Abs: 1.4 K/uL (ref 0.7–4.0)
MCH: 34.9 pg — ABNORMAL HIGH (ref 26.0–34.0)
MCHC: 35.6 g/dL (ref 30.0–36.0)
MCV: 98.1 fL (ref 80.0–100.0)
Monocytes Absolute: 0.4 K/uL (ref 0.1–1.0)
Monocytes Relative: 8 %
Neutro Abs: 3.5 K/uL (ref 1.7–7.7)
Neutrophils Relative %: 64 %
Platelet Count: 146 K/uL — ABNORMAL LOW (ref 150–400)
RBC: 4.27 MIL/uL (ref 4.22–5.81)
RDW: 12.2 % (ref 11.5–15.5)
WBC Count: 5.5 K/uL (ref 4.0–10.5)
nRBC: 0 % (ref 0.0–0.2)

## 2024-03-22 MED ORDER — BORTEZOMIB CHEMO SQ INJECTION 3.5 MG (2.5MG/ML)
1.3000 mg/m2 | Freq: Once | INTRAMUSCULAR | Status: AC
  Administered 2024-03-22: 2.75 mg via SUBCUTANEOUS
  Filled 2024-03-22: qty 1.1

## 2024-03-22 MED ORDER — PROCHLORPERAZINE MALEATE 10 MG PO TABS: 10.0000 mg | ORAL_TABLET | Freq: Once | ORAL | Status: DC

## 2024-03-22 NOTE — Progress Notes (Signed)
 Ok to treat with creatinine 1.36 per Dr. Timmy.

## 2024-03-22 NOTE — Patient Instructions (Signed)
 [facility]  Discharge Instructions: Thank you for choosing [facility] to provide your oncology and hematology care.   If you have a lab appointment with the Cancer Center, please go directly to the Cancer Center and check in at the registration area.  Wear comfortable clothing and clothing appropriate for easy access to any Portacath or PICC line.   We strive to give you quality time with your provider. You may need to reschedule your appointment if you arrive late (15 or more minutes).  Arriving late affects you and other patients whose appointments are after yours.  Also, if you miss three or more appointments without notifying the office, you may be dismissed from the clinic at the provider's discretion.      For prescription refill requests, have your pharmacy contact our office and allow 72 hours for refills to be completed.    Today you received the following chemotherapy and/or immunotherapy agents Velcade      To help prevent nausea and vomiting after your treatment, we encourage you to take your nausea medication as directed.  BELOW ARE SYMPTOMS THAT SHOULD BE REPORTED IMMEDIATELY: *FEVER GREATER THAN 100.4 F (38 C) OR HIGHER *CHILLS OR SWEATING *NAUSEA AND VOMITING THAT IS NOT CONTROLLED WITH YOUR NAUSEA MEDICATION *UNUSUAL SHORTNESS OF BREATH *UNUSUAL BRUISING OR BLEEDING *URINARY PROBLEMS (pain or burning when urinating, or frequent urination) *BOWEL PROBLEMS (unusual diarrhea, constipation, pain near the anus) TENDERNESS IN MOUTH AND THROAT WITH OR WITHOUT PRESENCE OF ULCERS (sore throat, sores in mouth, or a toothache) UNUSUAL RASH, SWELLING OR PAIN  UNUSUAL VAGINAL DISCHARGE OR ITCHING   Items with * indicate a potential emergency and should be followed up as soon as possible or go to the Emergency Department if any problems should occur.  Please show the CHEMOTHERAPY ALERT CARD or IMMUNOTHERAPY  ALERT CARD at check-in to the Emergency Department and triage nurse. Should you have questions after your visit or need to cancel or reschedule your appointment, please contact [facility] [facility] - A DEPT OF [facility]  [phone] and follow the prompts.  Office hours are 8:00 a.m. to 4:30 p.m. Monday - Friday. Please note that voicemails left after 4:00 p.m. may not be returned until the following business day.  We are closed weekends and major holidays. You have access to a nurse at all times for urgent questions. Please call the main number to the clinic [phone] and follow the prompts.  For any non-urgent questions, you may also contact your provider using MyChart. We now offer e-Visits for anyone 69 and older to request care online for non-urgent symptoms. For details visit [email or web address].   Also download the MyChart app! Go to the app store, search "MyChart", open the app, select [place], and log in with your MyChart username and password.

## 2024-04-06 ENCOUNTER — Inpatient Hospital Stay

## 2024-04-06 ENCOUNTER — Inpatient Hospital Stay: Admitting: Hematology & Oncology

## 2024-04-17 ENCOUNTER — Other Ambulatory Visit: Payer: Self-pay

## 2024-04-20 ENCOUNTER — Inpatient Hospital Stay (HOSPITAL_BASED_OUTPATIENT_CLINIC_OR_DEPARTMENT_OTHER): Admitting: Hematology & Oncology

## 2024-04-20 ENCOUNTER — Inpatient Hospital Stay: Attending: Hematology & Oncology

## 2024-04-20 ENCOUNTER — Encounter: Payer: Self-pay | Admitting: Hematology & Oncology

## 2024-04-20 ENCOUNTER — Inpatient Hospital Stay

## 2024-04-20 VITALS — BP 128/86 | HR 57 | Temp 97.6°F | Resp 20 | Ht 78.0 in | Wt 217.0 lb

## 2024-04-20 DIAGNOSIS — C9001 Multiple myeloma in remission: Secondary | ICD-10-CM

## 2024-04-20 DIAGNOSIS — Z5112 Encounter for antineoplastic immunotherapy: Secondary | ICD-10-CM | POA: Diagnosis present

## 2024-04-20 DIAGNOSIS — C9000 Multiple myeloma not having achieved remission: Secondary | ICD-10-CM | POA: Insufficient documentation

## 2024-04-20 LAB — CMP (CANCER CENTER ONLY)
ALT: 42 U/L (ref 0–44)
AST: 29 U/L (ref 15–41)
Albumin: 4.6 g/dL (ref 3.5–5.0)
Alkaline Phosphatase: 63 U/L (ref 38–126)
Anion gap: 9 (ref 5–15)
BUN: 23 mg/dL — ABNORMAL HIGH (ref 6–20)
CO2: 24 mmol/L (ref 22–32)
Calcium: 9.1 mg/dL (ref 8.9–10.3)
Chloride: 106 mmol/L (ref 98–111)
Creatinine: 1.38 mg/dL — ABNORMAL HIGH (ref 0.61–1.24)
GFR, Estimated: 59 mL/min — ABNORMAL LOW
Glucose, Bld: 101 mg/dL — ABNORMAL HIGH (ref 70–99)
Potassium: 4.8 mmol/L (ref 3.5–5.1)
Sodium: 139 mmol/L (ref 135–145)
Total Bilirubin: 0.6 mg/dL (ref 0.0–1.2)
Total Protein: 7.2 g/dL (ref 6.5–8.1)

## 2024-04-20 LAB — CBC WITH DIFFERENTIAL (CANCER CENTER ONLY)
Abs Immature Granulocytes: 0.01 K/uL (ref 0.00–0.07)
Basophils Absolute: 0 K/uL (ref 0.0–0.1)
Basophils Relative: 0 %
Eosinophils Absolute: 0.1 K/uL (ref 0.0–0.5)
Eosinophils Relative: 2 %
HCT: 41.5 % (ref 39.0–52.0)
Hemoglobin: 15 g/dL (ref 13.0–17.0)
Immature Granulocytes: 0 %
Lymphocytes Relative: 24 %
Lymphs Abs: 1.1 K/uL (ref 0.7–4.0)
MCH: 34.8 pg — ABNORMAL HIGH (ref 26.0–34.0)
MCHC: 36.1 g/dL — ABNORMAL HIGH (ref 30.0–36.0)
MCV: 96.3 fL (ref 80.0–100.0)
Monocytes Absolute: 0.4 K/uL (ref 0.1–1.0)
Monocytes Relative: 8 %
Neutro Abs: 3.1 K/uL (ref 1.7–7.7)
Neutrophils Relative %: 66 %
Platelet Count: 144 K/uL — ABNORMAL LOW (ref 150–400)
RBC: 4.31 MIL/uL (ref 4.22–5.81)
RDW: 12 % (ref 11.5–15.5)
WBC Count: 4.7 K/uL (ref 4.0–10.5)
nRBC: 0 % (ref 0.0–0.2)

## 2024-04-20 LAB — LACTATE DEHYDROGENASE: LDH: 153 U/L (ref 105–235)

## 2024-04-20 MED ORDER — PROCHLORPERAZINE MALEATE 10 MG PO TABS: 10.0000 mg | ORAL_TABLET | Freq: Once | ORAL | Status: DC

## 2024-04-20 MED ORDER — BORTEZOMIB CHEMO SQ INJECTION 3.5 MG (2.5MG/ML)
1.3000 mg/m2 | Freq: Once | INTRAMUSCULAR | Status: AC
  Administered 2024-04-20: 2.75 mg via SUBCUTANEOUS
  Filled 2024-04-20: qty 1.1

## 2024-04-20 NOTE — Patient Instructions (Signed)
 Bortezomib  Injection What is this medication? BORTEZOMIB  (bor TEZ oh mib) treats lymphoma. It may also be used to treat multiple myeloma, a type of bone marrow cancer. It works by blocking a protein that causes cancer cells to grow and multiply. This helps to slow or stop the spread of cancer cells. This medicine may be used for other purposes; ask your health care provider or pharmacist if you have questions. COMMON BRAND NAME(S): BORUZU , Velcade  What should I tell my care team before I take this medication? They need to know if you have any of these conditions: Dehydration Diabetes Heart disease Liver disease Tingling of the fingers or toes or other nerve disorder An unusual or allergic reaction to bortezomib , other medications, foods, dyes, or preservatives If you or your partner are pregnant or trying to get pregnant Breastfeeding How should I use this medication? This medication is injected into a vein or under the skin. It is given by your care team in a hospital or clinic setting. Talk to your care team about the use of this medication in children. Special care may be needed. Overdosage: If you think you have taken too much of this medicine contact a poison control center or emergency room at once. NOTE: This medicine is only for you. Do not share this medicine with others. What if I miss a dose? Keep appointments for follow-up doses. It is important not to miss your dose. Call your care team if you are unable to keep an appointment. What may interact with this medication? Ketoconazole Rifampin This list may not describe all possible interactions. Give your health care provider a list of all the medicines, herbs, non-prescription drugs, or dietary supplements you use. Also tell them if you smoke, drink alcohol, or use illegal drugs. Some items may interact with your medicine. What should I watch for while using this medication? Your condition will be monitored carefully while you  are receiving this medication. You may need blood work while taking this medication. This medication may affect your coordination, reaction time, or judgment. Do not drive or operate machinery until you know how this medication affects you. Sit up or stand slowly to reduce the risk of dizzy or fainting spells. Drinking alcohol with this medication can increase the risk of these side effects. This medication may increase your risk of getting an infection. Call your care team for advice if you get a fever, chills, sore throat, or other symptoms of a cold or flu. Do not treat yourself. Try to avoid being around people who are sick. Check with your care team if you have severe diarrhea, nausea, and vomiting, or if you sweat a lot. The loss of too much body fluid may make it dangerous for you to take this medication. Talk to your care team if you may be pregnant. Serious birth defects can occur if you take this medication during pregnancy and for 7 months after the last dose. You will need a negative pregnancy test before starting this medication. Contraception is recommended while taking this medication and for 7 months after the last dose. Your care team can help you find the option that works for you. If your partner can get pregnant, use a condom during sex while taking this medication and for 4 months after the last dose. Do not breastfeed while taking this medication and for 2 months after the last dose. This medication may cause infertility. Talk to your care team if you are concerned about your fertility. What side  effects may I notice from receiving this medication? Side effects that you should report to your care team as soon as possible: Allergic reactions--skin rash, itching, hives, swelling of the face, lips, tongue, or throat Bleeding--bloody or black, tar-like stools, vomiting blood or brown material that looks like coffee grounds, red or dark brown urine, small red or purple spots on skin,  unusual bruising or bleeding Bleeding in the brain--severe headache, stiff neck, confusion, dizziness, change in vision, numbness or weakness of the face, arm, or leg, trouble speaking, trouble walking, vomiting Bowel blockage--stomach cramping, unable to have a bowel movement or pass gas, loss of appetite, vomiting Heart failure--shortness of breath, swelling of the ankles, feet, or hands, sudden weight gain, unusual weakness or fatigue Infection--fever, chills, cough, sore throat, wounds that don't heal, pain or trouble when passing urine, general feeling of discomfort or being unwell Liver injury--right upper belly pain, loss of appetite, nausea, light-colored stool, dark yellow or brown urine, yellowing skin or eyes, unusual weakness or fatigue Low blood pressure--dizziness, feeling faint or lightheaded, blurry vision Lung injury--shortness of breath or trouble breathing, cough, spitting up blood, chest pain, fever Pain, tingling, or numbness in the hands or feet Severe or prolonged diarrhea Stomach pain, bloody diarrhea, pale skin, unusual weakness or fatigue, decrease in the amount of urine, which may be signs of hemolytic uremic syndrome Sudden and severe headache, confusion, change in vision, seizures, which may be signs of posterior reversible encephalopathy syndrome (PRES) TTP--purple spots on the skin or inside the mouth, pale skin, yellowing skin or eyes, unusual weakness or fatigue, fever, fast or irregular heartbeat, confusion, change in vision, trouble speaking, trouble walking Tumor lysis syndrome (TLS)--nausea, vomiting, diarrhea, decrease in the amount of urine, dark urine, unusual weakness or fatigue, confusion, muscle pain or cramps, fast or irregular heartbeat, joint pain Side effects that usually do not require medical attention (report to your care team if they continue or are bothersome): Constipation Diarrhea Fatigue Loss of appetite Nausea This list may not describe all  possible side effects. Call your doctor for medical advice about side effects. You may report side effects to FDA at 1-800-FDA-1088. Where should I keep my medication? This medication is given in a hospital or clinic. It will not be stored at home. NOTE: This sheet is a summary. It may not cover all possible information. If you have questions about this medicine, talk to your doctor, pharmacist, or health care provider.  2024 Elsevier/Gold Standard (2021-09-08 00:00:00)

## 2024-04-20 NOTE — Progress Notes (Signed)
 " Hematology and Oncology Follow Up Visit  Tyler Phillips [id number] [birth year] 59 y.o. 04/20/2024   Principle Diagnosis:  IgG Kappa myeloma - +4, +14, +17   Past Therapy: Status post autologous stem cell transplant on 05/22/2015   S/p cycle 6 of RVD Ninlaro  4 mg po q 2 week -- start on 01/02/2019 -- d/c on 02/10/2019   Current Therapy:        Velcade  q 3wk dosing Revlimid  10mg  po q day (21/7)  - d/c on 12/07/2021   Interim History:  Tyler Phillips is here today for follow-up and treatment.  He had a very nice holiday season.  Scemblix be quite busy this year.  He and his wife were out in [place].  That a wonderful time out in [place].  Then they stopped off in [place] to see their granddaughter.  They really enjoyed this..  Had a really busy Thanksgiving.  He is doing well healthwise.  He is on Velcade  every 3 weeks.  This is doing well for him.  He has had no problems with neuropathy.  When we last saw him, there was no monoclonal spike in his blood.  His IgG level was 1200 mg/dL.  His kappa light chain was 2.0 mg/dL.SABRA  He has had no problems with cough or shortness of breath.  He has works out all the time.  He is busy doing heavy labor around his house and yard.  He has quite a bit of property.  He has had no change in bowel or bladder habits.  He has had no rashes.  He has had no fever.  He has had no bleeding.  Overall, I will say that his performance status is ECOG 0.   Medications:  Allergies as of 04/20/2024   No Known Allergies      Medication List        Accurate as of April 20, 2024  8:35 AM. If you have any questions, ask your nurse or doctor.          aspirin  EC 81 MG tablet Take 81 mg by mouth daily. Swallow whole.   bortezomib  IV 3.5 MG injection Commonly known as: VELCADE  1 mg/m2 every 14 (fourteen) days.   cetirizine  10 MG tablet Commonly known as: ZYRTEC  Take 10 mg by mouth daily.   Jatenzo 237 MG Caps Generic drug: Testosterone   Undecanoate Take 1 capsule by mouth 2 (two) times daily.   Multi Vitamin/Minerals Tabs 1 tablet Orally once a day   ondansetron  4 MG tablet Commonly known as: ZOFRAN  Take 1 tablet (4 mg total) by mouth every 6 (six) hours as needed for nausea.   valACYclovir  500 MG tablet Commonly known as: VALTREX  Take 500 mg by mouth 2 (two) times daily.   zolpidem  10 MG tablet Commonly known as: AMBIEN  Take 1 tablet (10 mg total) by mouth at bedtime as needed for sleep.        Allergies: No Known Allergies  Past Medical History, Surgical history, Social history, and Family History were reviewed and updated.  Review of Systems: Review of Systems  Constitutional: Negative.   HENT: Negative.    Eyes: Negative.   Respiratory: Negative.    Cardiovascular: Negative.   Gastrointestinal: Negative.   Genitourinary: Negative.   Musculoskeletal: Negative.   Skin: Negative.   Neurological: Negative.   Endo/Heme/Allergies: Negative.   Psychiatric/Behavioral: Negative.     SABRA   Physical Exam:  height is 6' 6 (1.981 m) and weight is 217  lb (98.4 kg). His oral temperature is 97.6 F (36.4 C). His blood pressure is 128/86 and his pulse is 57 (abnormal). His respiration is 20 and oxygen saturation is 100%.   Wt Readings from Last 3 Encounters:  04/20/24 217 lb (98.4 kg)  02/24/24 219 lb 12.8 oz (99.7 kg)  01/06/24 217 lb (98.4 kg)    Physical Exam Vitals reviewed.  HENT:     Head: Normocephalic and atraumatic.  Eyes:     Pupils: Pupils are equal, round, and reactive to light.  Cardiovascular:     Rate and Rhythm: Normal rate and regular rhythm.     Heart sounds: Normal heart sounds.  Pulmonary:     Effort: Pulmonary effort is normal.     Breath sounds: Normal breath sounds.  Abdominal:     General: Bowel sounds are normal.     Palpations: Abdomen is soft.  Musculoskeletal:        General: No tenderness or deformity. Normal range of motion.     Cervical back: Normal range of  motion.     Comments: Extremities shows a walking boot on the left foot.  Otherwise, extremities do not show any edema.  He has good range of motion of his joints that he can move.  There is no swelling noted.  There is no erythema.  Lymphadenopathy:     Cervical: No cervical adenopathy.  Skin:    General: Skin is warm and dry.     Findings: No erythema or rash.  Neurological:     Mental Status: He is alert and oriented to person, place, and time.  Psychiatric:        Behavior: Behavior normal.        Thought Content: Thought content normal.        Judgment: Judgment normal.      Lab Results  Component Value Date   WBC 4.7 04/20/2024   HGB 15.0 04/20/2024   HCT 41.5 04/20/2024   MCV 96.3 04/20/2024   PLT 144 (L) 04/20/2024   Lab Results  Component Value Date   FERRITIN 1,263 (H) 07/20/2014   IRON 125 07/20/2014   TIBC 198 (L) 07/20/2014   UIBC 73 (L) 07/20/2014   IRONPCTSAT 63 (H) 07/20/2014   Lab Results  Component Value Date   RETICCTPCT 0.8 07/20/2014   RBC 4.31 04/20/2024   Lab Results  Component Value Date   KPAFRELGTCHN 20.2 (H) 02/24/2024   LAMBDASER 19.9 02/24/2024   KAPLAMBRATIO 1.02 02/24/2024   Lab Results  Component Value Date   IGGSERUM 1,165 02/24/2024   IGGSERUM 1,212 02/24/2024   IGA 157 02/24/2024   IGA 163 02/24/2024   IGMSERUM 34 02/24/2024   IGMSERUM 29 02/24/2024   Lab Results  Component Value Date   TOTALPROTELP 6.9 02/24/2024   ALBUMINELP 3.8 02/24/2024   A1GS 0.2 02/24/2024   A2GS 0.8 02/24/2024   BETS 1.0 02/24/2024   BETA2SER 0.3 03/28/2015   GAMS 1.2 02/24/2024   MSPIKE Not Observed 02/24/2024   SPEI Comment 11/25/2023     Chemistry      Component Value Date/Time   NA 140 03/22/2024 0848   NA 140 04/22/2017 1140   NA 138 09/03/2015 1042   K 4.6 03/22/2024 0848   K 4.0 04/22/2017 1140   K 4.3 09/03/2015 1042   CL 106 03/22/2024 0848   CL 105 04/22/2017 1140   CO2 24 03/22/2024 0848   CO2 24 04/22/2017 1140   CO2  25 09/03/2015 1042  BUN 13 03/22/2024 0848   BUN 16 04/22/2017 1140   BUN 21.7 09/03/2015 1042   CREATININE 1.36 (H) 03/22/2024 0848   CREATININE 1.6 (H) 04/22/2017 1140   CREATININE 1.2 09/03/2015 1042      Component Value Date/Time   CALCIUM  8.8 (L) 03/22/2024 0848   CALCIUM  8.5 04/22/2017 1140   CALCIUM  9.1 09/03/2015 1042   ALKPHOS 76 03/22/2024 0848   ALKPHOS 59 04/22/2017 1140   ALKPHOS 42 09/03/2015 1042   AST 33 03/22/2024 0848   AST 27 09/03/2015 1042   ALT 48 (H) 03/22/2024 0848   ALT 45 04/22/2017 1140   ALT 41 09/03/2015 1042   BILITOT 0.5 03/22/2024 0848   BILITOT 0.54 09/03/2015 1042       Impression and Plan: Tyler Phillips is a very pleasant 59 yo caucasian gentleman with IgG kappa myeloma. He underwent induction chemotherapy with RVD followed by an autologous stem cell transplant at [facility] on February 2017.   So far, he is doing incredibly well just  on single agent Velcade .  I do not see any evidence of progressive myeloma.    At some point, we may need to think about moving his Velcade  out to every 4 weeks.  I am happy that he had a wonderful 2025.  I know that he will have a fantastic 2026.  He is incredibly motivated.  He really has done a great job.  Has not been about 10 years since he was first diagnosed.    Maude JONELLE Crease, MD 1/2/20268:35 AM "

## 2024-04-21 LAB — IGG, IGA, IGM
IgA: 150 mg/dL (ref 90–386)
IgG (Immunoglobin G), Serum: 1210 mg/dL (ref 603–1613)
IgM (Immunoglobulin M), Srm: 33 mg/dL (ref 20–172)

## 2024-04-23 LAB — KAPPA/LAMBDA LIGHT CHAINS
Kappa free light chain: 19.4 mg/L (ref 3.3–19.4)
Kappa, lambda light chain ratio: 1.05 (ref 0.26–1.65)
Lambda free light chains: 18.4 mg/L (ref 5.7–26.3)

## 2024-04-24 ENCOUNTER — Other Ambulatory Visit: Payer: Self-pay

## 2024-04-24 LAB — PROTEIN ELECTROPHORESIS, SERUM
A/G Ratio: 1.3 (ref 0.7–1.7)
Albumin ELP: 3.8 g/dL (ref 2.9–4.4)
Alpha-1-Globulin: 0.2 g/dL (ref 0.0–0.4)
Alpha-2-Globulin: 0.7 g/dL (ref 0.4–1.0)
Beta Globulin: 1 g/dL (ref 0.7–1.3)
Gamma Globulin: 1.2 g/dL (ref 0.4–1.8)
Globulin, Total: 3 g/dL (ref 2.2–3.9)
Total Protein ELP: 6.8 g/dL (ref 6.0–8.5)

## 2024-05-02 ENCOUNTER — Other Ambulatory Visit: Payer: Self-pay

## 2024-05-11 ENCOUNTER — Inpatient Hospital Stay

## 2024-05-16 ENCOUNTER — Other Ambulatory Visit: Payer: Self-pay

## 2024-05-18 ENCOUNTER — Encounter: Payer: Self-pay | Admitting: Hematology & Oncology

## 2024-05-18 ENCOUNTER — Inpatient Hospital Stay

## 2024-05-18 ENCOUNTER — Inpatient Hospital Stay: Admitting: Hematology & Oncology

## 2024-05-18 VITALS — BP 135/91 | HR 60 | Temp 98.3°F | Resp 20 | Ht 78.0 in | Wt 217.4 lb

## 2024-05-18 DIAGNOSIS — Z5112 Encounter for antineoplastic immunotherapy: Secondary | ICD-10-CM | POA: Diagnosis not present

## 2024-05-18 DIAGNOSIS — C9001 Multiple myeloma in remission: Secondary | ICD-10-CM

## 2024-05-18 LAB — CMP (CANCER CENTER ONLY)
ALT: 57 U/L — ABNORMAL HIGH (ref 0–44)
AST: 33 U/L (ref 15–41)
Albumin: 4.7 g/dL (ref 3.5–5.0)
Alkaline Phosphatase: 70 U/L (ref 38–126)
Anion gap: 8 (ref 5–15)
BUN: 15 mg/dL (ref 6–20)
CO2: 27 mmol/L (ref 22–32)
Calcium: 9.2 mg/dL (ref 8.9–10.3)
Chloride: 105 mmol/L (ref 98–111)
Creatinine: 1.41 mg/dL — ABNORMAL HIGH (ref 0.61–1.24)
GFR, Estimated: 57 mL/min — ABNORMAL LOW
Glucose, Bld: 102 mg/dL — ABNORMAL HIGH (ref 70–99)
Potassium: 4.9 mmol/L (ref 3.5–5.1)
Sodium: 140 mmol/L (ref 135–145)
Total Bilirubin: 0.7 mg/dL (ref 0.0–1.2)
Total Protein: 7.2 g/dL (ref 6.5–8.1)

## 2024-05-18 LAB — CBC WITH DIFFERENTIAL (CANCER CENTER ONLY)
Abs Immature Granulocytes: 0.01 10*3/uL (ref 0.00–0.07)
Basophils Absolute: 0 10*3/uL (ref 0.0–0.1)
Basophils Relative: 0 %
Eosinophils Absolute: 0.1 10*3/uL (ref 0.0–0.5)
Eosinophils Relative: 2 %
HCT: 44 % (ref 39.0–52.0)
Hemoglobin: 15.3 g/dL (ref 13.0–17.0)
Immature Granulocytes: 0 %
Lymphocytes Relative: 26 %
Lymphs Abs: 1.3 10*3/uL (ref 0.7–4.0)
MCH: 33.9 pg (ref 26.0–34.0)
MCHC: 34.8 g/dL (ref 30.0–36.0)
MCV: 97.6 fL (ref 80.0–100.0)
Monocytes Absolute: 0.4 10*3/uL (ref 0.1–1.0)
Monocytes Relative: 8 %
Neutro Abs: 3.1 10*3/uL (ref 1.7–7.7)
Neutrophils Relative %: 64 %
Platelet Count: 145 10*3/uL — ABNORMAL LOW (ref 150–400)
RBC: 4.51 MIL/uL (ref 4.22–5.81)
RDW: 12.2 % (ref 11.5–15.5)
WBC Count: 4.9 10*3/uL (ref 4.0–10.5)
nRBC: 0 % (ref 0.0–0.2)

## 2024-05-18 LAB — LACTATE DEHYDROGENASE: LDH: 172 U/L (ref 105–235)

## 2024-05-18 MED ORDER — PROCHLORPERAZINE MALEATE 10 MG PO TABS: 10.0000 mg | ORAL_TABLET | Freq: Once | ORAL | Status: DC

## 2024-05-18 MED ORDER — BORTEZOMIB CHEMO SQ INJECTION 3.5 MG (2.5MG/ML)
1.3000 mg/m2 | Freq: Once | INTRAMUSCULAR | Status: AC
  Administered 2024-05-18: 2.75 mg via SUBCUTANEOUS
  Filled 2024-05-18: qty 1.1

## 2024-05-18 NOTE — Progress Notes (Signed)
 " Hematology and Oncology Follow Up Visit  Tyler Phillips [id number] Mar 21, 1966 59 y.o. 05/18/2024   Principle Diagnosis:  IgG Kappa myeloma - +4, +14, +17   Past Therapy: Status post autologous stem cell transplant on 05/22/2015   S/p cycle 6 of RVD Ninlaro  4 mg po q 2 week -- start on 01/02/2019 -- d/c on 02/10/2019   Current Therapy:        Velcade  q 3wk dosing Revlimid  10mg  po q day (21/7)  - d/c on 12/07/2021   Interim History:  Tyler Phillips is here today for follow-up and treatment.  Overall, he is doing very nicely.  He really has had no complaints.  He to enjoy the nice snow he had last weekend.  He was on his ATV.  I am sure he will do the same this weekend.  He has had no problems with nausea or vomiting.  I think the 1 issue that has his neuropathy.  This is started become more of an issue with his left foot.  He has more problems when it is cold outside.  I told him to try some over-the-counter vitamin B6.  This may help.  He is doing well with the Velcade  otherwise.  There is no issues with neuropathy in his hands.  He has been in remission.  When we last saw him, there was no monoclonal spike in his blood.  His IgG level was 1210 mg/dL.  The Kappa light chain was 1.9 mg/dL.  He has had no problems with bowels or bladder.  His appetite is good.  He has had no rashes.  There is been no bleeding.  He has had no fever.  Thankfully, he has had no problem with COVID or Influenza.  Overall, I would say that his performance status is probably ECOG 0.    Medications:  Allergies as of 05/18/2024   No Known Allergies      Medication List        Accurate as of May 18, 2024  9:17 AM. If you have any questions, ask your nurse or doctor.          aspirin  EC 81 MG tablet Take 81 mg by mouth daily. Swallow whole.   bortezomib  IV 3.5 MG injection Commonly known as: VELCADE  1 mg/m2 every 14 (fourteen) days.   cetirizine  10 MG tablet Commonly known as: ZYRTEC  Take 10  mg by mouth daily.   Jatenzo 237 MG Caps Generic drug: Testosterone  Undecanoate Take 1 capsule by mouth 2 (two) times daily.   Multi Vitamin/Minerals Tabs 1 tablet Orally once a day   ondansetron  4 MG tablet Commonly known as: ZOFRAN  Take 1 tablet (4 mg total) by mouth every 6 (six) hours as needed for nausea.   valACYclovir  500 MG tablet Commonly known as: VALTREX  Take 500 mg by mouth 2 (two) times daily.   zolpidem  10 MG tablet Commonly known as: AMBIEN  Take 1 tablet (10 mg total) by mouth at bedtime as needed for sleep.        Allergies: No Known Allergies  Past Medical History, Surgical history, Social history, and Family History were reviewed and updated.  Review of Systems: Review of Systems  Constitutional: Negative.   HENT: Negative.    Eyes: Negative.   Respiratory: Negative.    Cardiovascular: Negative.   Gastrointestinal: Negative.   Genitourinary: Negative.   Musculoskeletal: Negative.   Skin: Negative.   Neurological: Negative.   Endo/Heme/Allergies: Negative.   Psychiatric/Behavioral: Negative.     SABRA  Physical Exam:  height is 6' 6 (1.981 m) and weight is 217 lb 6.4 oz (98.6 kg). His oral temperature is 98.3 F (36.8 C). His blood pressure is 135/91 (abnormal) and his pulse is 60. His respiration is 20 and oxygen saturation is 100%.   Wt Readings from Last 3 Encounters:  05/18/24 217 lb 6.4 oz (98.6 kg)  04/20/24 217 lb (98.4 kg)  02/24/24 219 lb 12.8 oz (99.7 kg)    Physical Exam Vitals reviewed.  HENT:     Head: Normocephalic and atraumatic.  Eyes:     Pupils: Pupils are equal, round, and reactive to light.  Cardiovascular:     Rate and Rhythm: Normal rate and regular rhythm.     Heart sounds: Normal heart sounds.  Pulmonary:     Effort: Pulmonary effort is normal.     Breath sounds: Normal breath sounds.  Abdominal:     General: Bowel sounds are normal.     Palpations: Abdomen is soft.  Musculoskeletal:        General: No  tenderness or deformity. Normal range of motion.     Cervical back: Normal range of motion.     Comments: Extremities shows a walking boot on the left foot.  Otherwise, extremities do not show any edema.  He has good range of motion of his joints that he can move.  There is no swelling noted.  There is no erythema.  Lymphadenopathy:     Cervical: No cervical adenopathy.  Skin:    General: Skin is warm and dry.     Findings: No erythema or rash.  Neurological:     Mental Status: He is alert and oriented to person, place, and time.  Psychiatric:        Behavior: Behavior normal.        Thought Content: Thought content normal.        Judgment: Judgment normal.      Lab Results  Component Value Date   WBC 4.9 05/18/2024   HGB 15.3 05/18/2024   HCT 44.0 05/18/2024   MCV 97.6 05/18/2024   PLT 145 (L) 05/18/2024   Lab Results  Component Value Date   FERRITIN 1,263 (H) 07/20/2014   IRON 125 07/20/2014   TIBC 198 (L) 07/20/2014   UIBC 73 (L) 07/20/2014   IRONPCTSAT 63 (H) 07/20/2014   Lab Results  Component Value Date   RETICCTPCT 0.8 07/20/2014   RBC 4.51 05/18/2024   Lab Results  Component Value Date   KPAFRELGTCHN 19.4 04/20/2024   LAMBDASER 18.4 04/20/2024   KAPLAMBRATIO 1.05 04/20/2024   Lab Results  Component Value Date   IGGSERUM 1,210 04/20/2024   IGA 150 04/20/2024   IGMSERUM 33 04/20/2024   Lab Results  Component Value Date   TOTALPROTELP 6.8 04/20/2024   ALBUMINELP 3.8 04/20/2024   A1GS 0.2 04/20/2024   A2GS 0.7 04/20/2024   BETS 1.0 04/20/2024   BETA2SER 0.3 03/28/2015   GAMS 1.2 04/20/2024   MSPIKE Not Observed 04/20/2024   SPEI Comment 04/20/2024     Chemistry      Component Value Date/Time   NA 140 05/18/2024 0803   NA 140 04/22/2017 1140   NA 138 09/03/2015 1042   K 4.9 05/18/2024 0803   K 4.0 04/22/2017 1140   K 4.3 09/03/2015 1042   CL 105 05/18/2024 0803   CL 105 04/22/2017 1140   CO2 27 05/18/2024 0803   CO2 24 04/22/2017 1140    CO2 25 09/03/2015 1042  BUN 15 05/18/2024 0803   BUN 16 04/22/2017 1140   BUN 21.7 09/03/2015 1042   CREATININE 1.41 (H) 05/18/2024 0803   CREATININE 1.6 (H) 04/22/2017 1140   CREATININE 1.2 09/03/2015 1042      Component Value Date/Time   CALCIUM  9.2 05/18/2024 0803   CALCIUM  8.5 04/22/2017 1140   CALCIUM  9.1 09/03/2015 1042   ALKPHOS 70 05/18/2024 0803   ALKPHOS 59 04/22/2017 1140   ALKPHOS 42 09/03/2015 1042   AST 33 05/18/2024 0803   AST 27 09/03/2015 1042   ALT 57 (H) 05/18/2024 0803   ALT 45 04/22/2017 1140   ALT 41 09/03/2015 1042   BILITOT 0.7 05/18/2024 0803   BILITOT 0.54 09/03/2015 1042       Impression and Plan: Tyler Phillips is a very pleasant 59 yo caucasian gentleman with IgG kappa myeloma. He underwent induction chemotherapy with RVD followed by an autologous stem cell transplant at [facility] on February 2017.   So far, he is doing incredibly well just  on single agent Velcade .  I do not see any evidence of progressive myeloma.    We will have to monitor this neuropathy.  If this becomes more of an issue, we may have to think about making some kind of change.  I do not want to see him compromised with his quality of life because of neuropathy.  We will continue him as scheduled.  I will plan to see him back myself in 6 weeks.    Maude JONELLE Crease, MD 1/30/20269:17 AM "

## 2024-05-18 NOTE — Patient Instructions (Signed)
 [facility]  Discharge Instructions: Thank you for choosing [facility] to provide your oncology and hematology care.   If you have a lab appointment with the Cancer Center, please go directly to the Cancer Center and check in at the registration area.  Wear comfortable clothing and clothing appropriate for easy access to any Portacath or PICC line.   We strive to give you quality time with your provider. You may need to reschedule your appointment if you arrive late (15 or more minutes).  Arriving late affects you and other patients whose appointments are after yours.  Also, if you miss three or more appointments without notifying the office, you may be dismissed from the clinic at the provider's discretion.      For prescription refill requests, have your pharmacy contact our office and allow 72 hours for refills to be completed.    Today you received the following chemotherapy and/or immunotherapy agents Velcade      To help prevent nausea and vomiting after your treatment, we encourage you to take your nausea medication as directed.  BELOW ARE SYMPTOMS THAT SHOULD BE REPORTED IMMEDIATELY: *FEVER GREATER THAN 100.4 F (38 C) OR HIGHER *CHILLS OR SWEATING *NAUSEA AND VOMITING THAT IS NOT CONTROLLED WITH YOUR NAUSEA MEDICATION *UNUSUAL SHORTNESS OF BREATH *UNUSUAL BRUISING OR BLEEDING *URINARY PROBLEMS (pain or burning when urinating, or frequent urination) *BOWEL PROBLEMS (unusual diarrhea, constipation, pain near the anus) TENDERNESS IN MOUTH AND THROAT WITH OR WITHOUT PRESENCE OF ULCERS (sore throat, sores in mouth, or a toothache) UNUSUAL RASH, SWELLING OR PAIN  UNUSUAL VAGINAL DISCHARGE OR ITCHING   Items with * indicate a potential emergency and should be followed up as soon as possible or go to the Emergency Department if any problems should occur.  Please show the CHEMOTHERAPY ALERT CARD or IMMUNOTHERAPY  ALERT CARD at check-in to the Emergency Department and triage nurse. Should you have questions after your visit or need to cancel or reschedule your appointment, please contact [facility] [facility] - A DEPT OF [facility]  [phone] and follow the prompts.  Office hours are 8:00 a.m. to 4:30 p.m. Monday - Friday. Please note that voicemails left after 4:00 p.m. may not be returned until the following business day.  We are closed weekends and major holidays. You have access to a nurse at all times for urgent questions. Please call the main number to the clinic [phone] and follow the prompts.  For any non-urgent questions, you may also contact your provider using MyChart. We now offer e-Visits for anyone 69 and older to request care online for non-urgent symptoms. For details visit [email or web address].   Also download the MyChart app! Go to the app store, search "MyChart", open the app, select [place], and log in with your MyChart username and password.

## 2024-05-19 LAB — IGG, IGA, IGM
IgA: 156 mg/dL (ref 90–386)
IgG (Immunoglobin G), Serum: 1187 mg/dL (ref 603–1613)
IgM (Immunoglobulin M), Srm: 32 mg/dL (ref 20–172)

## 2024-05-21 LAB — KAPPA/LAMBDA LIGHT CHAINS
Kappa free light chain: 19.7 mg/L — ABNORMAL HIGH (ref 3.3–19.4)
Kappa, lambda light chain ratio: 1.04 (ref 0.26–1.65)
Lambda free light chains: 18.9 mg/L (ref 5.7–26.3)

## 2024-05-22 LAB — PROTEIN ELECTROPHORESIS, SERUM, WITH REFLEX
A/G Ratio: 1.4 (ref 0.7–1.7)
Albumin ELP: 4.2 g/dL (ref 2.9–4.4)
Alpha-1-Globulin: 0.2 g/dL (ref 0.0–0.4)
Alpha-2-Globulin: 0.7 g/dL (ref 0.4–1.0)
Beta Globulin: 1 g/dL (ref 0.7–1.3)
Gamma Globulin: 1.2 g/dL (ref 0.4–1.8)
Globulin, Total: 3.1 g/dL (ref 2.2–3.9)
Total Protein ELP: 7.3 g/dL (ref 6.0–8.5)

## 2024-06-01 ENCOUNTER — Inpatient Hospital Stay: Admitting: Hematology & Oncology

## 2024-06-01 ENCOUNTER — Inpatient Hospital Stay

## 2024-06-08 ENCOUNTER — Inpatient Hospital Stay

## 2024-06-08 ENCOUNTER — Inpatient Hospital Stay: Attending: Hematology & Oncology

## 2024-06-08 ENCOUNTER — Inpatient Hospital Stay: Admitting: Hematology & Oncology

## 2024-06-22 ENCOUNTER — Inpatient Hospital Stay

## 2024-06-22 ENCOUNTER — Inpatient Hospital Stay: Admitting: Hematology & Oncology

## 2024-06-29 ENCOUNTER — Inpatient Hospital Stay: Admitting: Hematology & Oncology

## 2024-06-29 ENCOUNTER — Inpatient Hospital Stay: Attending: Hematology & Oncology

## 2024-06-29 ENCOUNTER — Inpatient Hospital Stay
